# Patient Record
Sex: Male | Born: 1940 | Race: White | Hispanic: No | Marital: Married | Smoking: Former smoker
Health system: Southern US, Community
[De-identification: ages and names within clinical notes are randomized; demographics above are authoritative.]

## PROBLEM LIST (undated history)

## (undated) DIAGNOSIS — I10 Essential (primary) hypertension: Secondary | ICD-10-CM

## (undated) DIAGNOSIS — C801 Malignant (primary) neoplasm, unspecified: Secondary | ICD-10-CM

## (undated) DIAGNOSIS — C4491 Basal cell carcinoma of skin, unspecified: Secondary | ICD-10-CM

## (undated) DIAGNOSIS — Z8619 Personal history of other infectious and parasitic diseases: Secondary | ICD-10-CM

## (undated) DIAGNOSIS — G589 Mononeuropathy, unspecified: Secondary | ICD-10-CM

## (undated) DIAGNOSIS — I639 Cerebral infarction, unspecified: Secondary | ICD-10-CM

## (undated) DIAGNOSIS — Z8673 Personal history of transient ischemic attack (TIA), and cerebral infarction without residual deficits: Secondary | ICD-10-CM

## (undated) DIAGNOSIS — E039 Hypothyroidism, unspecified: Secondary | ICD-10-CM

## (undated) DIAGNOSIS — H8110 Benign paroxysmal vertigo, unspecified ear: Secondary | ICD-10-CM

## (undated) DIAGNOSIS — I4891 Unspecified atrial fibrillation: Secondary | ICD-10-CM

## (undated) DIAGNOSIS — L821 Other seborrheic keratosis: Secondary | ICD-10-CM

## (undated) HISTORY — PX: HERNIA REPAIR: SHX51

## (undated) HISTORY — PX: SKIN SURGERY: SHX2413

## (undated) HISTORY — DX: Other seborrheic keratosis: L82.1

## (undated) HISTORY — PX: OTHER SURGICAL HISTORY: SHX169

## (undated) HISTORY — DX: Unspecified atrial fibrillation: I48.91

## (undated) HISTORY — DX: Basal cell carcinoma of skin, unspecified: C44.91

## (undated) HISTORY — DX: Personal history of other infectious and parasitic diseases: Z86.19

## (undated) HISTORY — DX: Cerebral infarction, unspecified: I63.9

## (undated) HISTORY — DX: Hypothyroidism, unspecified: E03.9

## (undated) HISTORY — DX: Personal history of transient ischemic attack (TIA), and cerebral infarction without residual deficits: Z86.73

## (undated) HISTORY — PX: FINGER AMPUTATION: SHX636

## (undated) HISTORY — DX: Mononeuropathy, unspecified: G58.9

## (undated) HISTORY — DX: Benign paroxysmal vertigo, unspecified ear: H81.10

---

## 1993-11-16 HISTORY — PX: PROSTATECTOMY: SHX69

## 2009-05-09 DIAGNOSIS — K219 Gastro-esophageal reflux disease without esophagitis: Secondary | ICD-10-CM | POA: Insufficient documentation

## 2009-05-09 DIAGNOSIS — L821 Other seborrheic keratosis: Secondary | ICD-10-CM | POA: Insufficient documentation

## 2009-05-09 DIAGNOSIS — J309 Allergic rhinitis, unspecified: Secondary | ICD-10-CM | POA: Insufficient documentation

## 2009-05-09 DIAGNOSIS — G4700 Insomnia, unspecified: Secondary | ICD-10-CM | POA: Insufficient documentation

## 2010-01-15 DIAGNOSIS — H8110 Benign paroxysmal vertigo, unspecified ear: Secondary | ICD-10-CM | POA: Insufficient documentation

## 2010-03-20 ENCOUNTER — Ambulatory Visit: Payer: Self-pay | Admitting: Family Medicine

## 2010-03-20 HISTORY — PX: OTHER SURGICAL HISTORY: SHX169

## 2010-03-21 DIAGNOSIS — Z8673 Personal history of transient ischemic attack (TIA), and cerebral infarction without residual deficits: Secondary | ICD-10-CM | POA: Insufficient documentation

## 2010-04-02 ENCOUNTER — Ambulatory Visit: Payer: Self-pay | Admitting: Cardiovascular Disease

## 2010-04-02 ENCOUNTER — Ambulatory Visit: Payer: Self-pay | Admitting: Family Medicine

## 2010-04-02 HISTORY — PX: OTHER SURGICAL HISTORY: SHX169

## 2010-04-02 HISTORY — PX: DOPPLER ECHOCARDIOGRAPHY: SHX263

## 2010-04-28 DIAGNOSIS — S336XXA Sprain of sacroiliac joint, initial encounter: Secondary | ICD-10-CM | POA: Insufficient documentation

## 2010-05-13 DIAGNOSIS — E039 Hypothyroidism, unspecified: Secondary | ICD-10-CM | POA: Insufficient documentation

## 2010-07-10 ENCOUNTER — Ambulatory Visit: Payer: Self-pay | Admitting: Gastroenterology

## 2010-07-16 LAB — PATHOLOGY REPORT

## 2010-08-14 ENCOUNTER — Ambulatory Visit: Payer: Self-pay | Admitting: Otolaryngology

## 2010-09-08 ENCOUNTER — Ambulatory Visit: Payer: Self-pay | Admitting: Otolaryngology

## 2011-12-14 ENCOUNTER — Encounter: Payer: Self-pay | Admitting: Cardiovascular Disease

## 2011-12-14 ENCOUNTER — Ambulatory Visit (INDEPENDENT_AMBULATORY_CARE_PROVIDER_SITE_OTHER): Payer: Medicare Other | Admitting: Cardiovascular Disease

## 2011-12-14 VITALS — BP 153/79 | HR 90 | Ht 70.0 in | Wt 211.0 lb

## 2011-12-14 DIAGNOSIS — E781 Pure hyperglyceridemia: Secondary | ICD-10-CM

## 2011-12-14 DIAGNOSIS — I10 Essential (primary) hypertension: Secondary | ICD-10-CM

## 2011-12-14 DIAGNOSIS — I4891 Unspecified atrial fibrillation: Secondary | ICD-10-CM

## 2011-12-14 DIAGNOSIS — I499 Cardiac arrhythmia, unspecified: Secondary | ICD-10-CM

## 2011-12-14 MED ORDER — DILTIAZEM HCL 30 MG PO TABS: 30.0000 mg | ORAL_TABLET | Freq: Three times a day (TID) | ORAL | Status: DC | PRN

## 2011-12-14 NOTE — Patient Instructions (Addendum)
Please take the diltiazem one pill as needed for fast heart rate or palpitations Start the xarelto (blood thinner) once a day If there is no side effects we can call in a script. Stop the aspirin  Please call us if you have new issues that need to be addressed before your next appt.  Your physician wants you to follow-up in: 4 week You will receive a reminder letter in the mail two months in advance. If you don't receive a letter, please call our office to schedule the follow-up appointment.

## 2011-12-14 NOTE — Progress Notes (Signed)
Patient ID: Ian Sanders, male    DOB: 05-10-1941, 71 y.o.   MRN: [id number]  HPI Comments: Mr. 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 Sherrie Mustache. He presents for evaluation of his atrial fibrillation. He reports that he does have significant sleep apnea and does not use his CPAP.  He reports that on Wednesday this past week he had tachycardia while laying down at night, otherwise he is not very symptomatic from his atrial fibrillation. He has started to walk with his wife for exercise and has noticed some shortness of breath when he first starts. He takes a small break and then is able to catch up on his breathing and then is able to walk further with no difficulties. He denies any regular symptoms of shortness of breath with exertion or tachycardia or palpitations. He does not know when he converted to atrial fibrillation. He is currently taking a full aspirin.  He reports that he was previously on aSecond blood pressure medication but this caused bradycardia and he stopped this.  Echocardiogram May 2011 showed mildly dilated left atrium, no significant valvular disease, normal ejection fraction. Carotid ultrasound shows small calcified plaque bilaterally with no significant stenosis, May 2011  Recent blood work shows total cholesterol 169, LDL 85, HDL 28, ALT 72, triglycerides 278, normal TSH at 4.28  EKG shows atrial fibrillation with rate 92 beats per minute   Outpatient Encounter Prescriptions as of 12/14/2011  Medication Sig Dispense Refill  . amitriptyline (ELAVIL) 10 MG tablet Take 10 mg by mouth 2 (two) times daily.       Marland Kitchen aspirin 325 MG  tablet Take 325 mg by mouth daily.      Marland Kitchen atenolol-chlorthalidone (TENORETIC) 100-25 MG per tablet Take 1 tablet by mouth daily.      . Magnesium 100 MG CAPS Take 100 mg by mouth. Take four tablets daily.      . Multiple Vitamin (MULTIVITAMIN) tablet Take 1 tablet by mouth daily.      . Omega-3 Fatty Acids (FISH OIL) 1000 MG CAPS Take 1,000 mg by mouth 2 (two) times daily.      Marland Kitchen omeprazole (PRILOSEC) 40 MG capsule Take 40 mg by mouth daily.      . vitamin B-12 (CYANOCOBALAMIN) 100 MCG tablet Take 50 mcg by mouth daily.      Marland Kitchen zolpidem (AMBIEN) 10 MG tablet Take 10 mg by mouth at bedtime as needed.      . diltiazem (CARDIZEM) 30 MG tablet Take 1 tablet (30 mg total) by mouth 3 (three) times daily as needed. For fast heart rate  90 tablet  11     Review of Systems  Constitutional: Negative.   HENT: Negative.   Eyes: Negative.   Respiratory: Negative.   Cardiovascular: Positive for palpitations.  Gastrointestinal: Negative.   Musculoskeletal: Negative.   Skin: Negative.   Neurological: Negative.   Hematological: Negative.   Psychiatric/Behavioral: Negative.   All other systems reviewed and are negative.    BP 153/79  Pulse 90  Ht 5\' 10"  (1.778 m)  Wt 211 lb (95.709 kg)  BMI 30.28 kg/m2  Physical Exam  Nursing note and vitals reviewed.  Constitutional: He is oriented to person, place, and time. He appears well-developed and well-nourished.  HENT:  Head: Normocephalic.  Nose: Nose normal.  Mouth/Throat: Oropharynx is clear and moist.  Eyes: Conjunctivae are normal. Pupils are equal, round, and reactive to light.  Neck: Normal range of motion. Neck supple. No JVD present.  Cardiovascular: Normal rate, S1 normal, S2 normal, normal heart sounds and intact distal pulses.  An irregularly irregular rhythm present. Exam reveals no gallop and no friction rub.   No murmur heard. Pulmonary/Chest: Effort normal and breath sounds normal. No respiratory distress. He has no wheezes. He has  no rales. He exhibits no tenderness.  Abdominal: Soft. Bowel sounds are normal. He exhibits no distension. There is no tenderness.  Musculoskeletal: Normal range of motion. He exhibits no edema and no tenderness.  Lymphadenopathy:    He has no cervical adenopathy.  Neurological: He is alert and oriented to person, place, and time. Coordination normal.  Skin: Skin is warm and dry. No rash noted. No erythema.  Psychiatric: He has a normal mood and affect. His behavior is normal. Judgment and thought content normal.           Assessment and Plan

## 2011-12-14 NOTE — Assessment & Plan Note (Signed)
Blood pressure mildly elevated today. He will monitor his blood pressure at home.

## 2011-12-14 NOTE — Assessment & Plan Note (Signed)
Uncertain how long he has been in atrial fibrillation as he is relatively asymptomatic.  We have discussed with him the risk and benefit of not being on anticoagulation. He would like to start xarelto 20 mg daily. We have given him samples to try for the month. If he has side effects, we could either retry pradaxa 150 mg b.i.d. Or warfarin.  We will meet with him again in 4 weeks' time to determine if he would like further intervention such as a cardioversion. Concerned that his episodes of atrial fibrillation could be secondary to his underlying sleep apnea, possibly hypertension. It is certainly possible that he may have a successful cardioversion only to have recurrence of his atrial fibrillation secondary to OSA.  We have given him a prescription for diltiazem 30 mg to take p.r.n. For tachycardia or palpitations. He will monitor his blood pressure and heart rate at home.

## 2011-12-14 NOTE — Assessment & Plan Note (Signed)
We have encouraged continued exercise, careful diet management in an effort to lose weight. 

## 2012-01-11 ENCOUNTER — Encounter: Payer: Self-pay | Admitting: Cardiovascular Disease

## 2012-01-11 ENCOUNTER — Ambulatory Visit (INDEPENDENT_AMBULATORY_CARE_PROVIDER_SITE_OTHER): Payer: Medicare Other | Admitting: Cardiovascular Disease

## 2012-01-11 DIAGNOSIS — E781 Pure hyperglyceridemia: Secondary | ICD-10-CM

## 2012-01-11 DIAGNOSIS — I4891 Unspecified atrial fibrillation: Secondary | ICD-10-CM

## 2012-01-11 DIAGNOSIS — I10 Essential (primary) hypertension: Secondary | ICD-10-CM

## 2012-01-11 MED ORDER — DIGOXIN 125 MCG PO TABS: 125.0000 ug | ORAL_TABLET | Freq: Every day | ORAL | Status: DC

## 2012-01-11 MED ORDER — RIVAROXABAN 20 MG PO TABS: 1.0000 | ORAL_TABLET | Freq: Every day | ORAL | Status: DC

## 2012-01-11 NOTE — Assessment & Plan Note (Signed)
We have encouraged continued exercise, careful diet management in an effort to lose weight. 

## 2012-01-11 NOTE — Progress Notes (Signed)
Patient ID: Ian Sanders, male    DOB: 24-Dec-1940, 71 y.o.   MRN: [id number]  HPI Comments: Mr. 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 Sherrie Mustache. He presents for evaluation of his atrial fibrillation. He reports that he does have significant sleep apnea and does not use his CPAP.  He was noted to be in atrial fibrillation On his last clinic visit. He has had episodes of tachycardia since we last saw him. Last episode was one week ago. He reports his blood pressure has been well-controlled at home. He does report having significant sleep problems, possible sleep apnea. He is taking xerelto 20 mg daily  Echocardiogram May 2011 showed mildly dilated left atrium, no significant valvular disease, normal ejection fraction. Carotid ultrasound shows small calcified plaque bilaterally with no significant stenosis, May 2011  Recent blood work shows total cholesterol 169, LDL 85, HDL 28, ALT 72, triglycerides 278, normal TSH at 4.28  EKG shows Normal sinus rhythm with rate 64 beats per minute with no significant ST or T wave changes   Outpatient Encounter Prescriptions as of 72/25/2013  Medication Sig Dispense Refill  . amitriptyline (ELAVIL) 10 MG tablet Take 10 mg by mouth 2 (two) times daily.       Marland Kitchen atenolol-chlorthalidone (TENORETIC) 100-25 MG per tablet Take 1 tablet by mouth daily.      Marland Kitchen diltiazem (CARDIZEM) 30 MG tablet Take 1 tablet (30 mg total) by mouth 3 (three) times daily as needed. For fast heart rate  90 tablet  11  . Magnesium 100 MG CAPS Take 100 mg by mouth. Take four tablets daily.      . Multiple Vitamin (MULTIVITAMIN) tablet Take 1  tablet by mouth daily.      . Omega-3 Fatty Acids (FISH OIL) 1000 MG CAPS Take 2,000 mg by mouth 2 (two) times daily.       Marland Kitchen omeprazole (PRILOSEC) 40 MG capsule Take 40 mg by mouth daily.      . Rivaroxaban (XARELTO) 20 MG TABS Take 1 tablet by mouth daily.  90 tablet  3  . vitamin B-12 (CYANOCOBALAMIN) 100 MCG tablet Take 100 mcg by mouth daily.       Marland Kitchen zolpidem (AMBIEN) 10 MG tablet Take 10 mg by mouth at bedtime as needed.         Review of Systems  Constitutional: Negative.   HENT: Negative.   Eyes: Negative.   Respiratory: Negative.   Cardiovascular: Positive for palpitations.  Gastrointestinal: Negative.   Musculoskeletal: Negative.   Skin: Negative.   Neurological: Negative.   Hematological: Negative.   Psychiatric/Behavioral: Negative.   All other systems reviewed and are negative.    BP 128/72  Pulse 64  Ht 5\' 10"  (1.778 m)  Wt 215 lb 6.4 oz (97.705 kg)  BMI 30.91 kg/m2  Physical Exam  Nursing note and vitals reviewed. Constitutional: He is oriented to person, place, and time. He appears well-developed and well-nourished.  HENT:  Head: Normocephalic.  Nose: Nose normal.  Mouth/Throat: Oropharynx is clear and moist.  Eyes: Conjunctivae are normal. Pupils are equal, round, and reactive to light.  Neck: Normal range of motion. Neck supple. No  JVD present.  Cardiovascular: Normal rate, S1 normal, S2 normal, normal heart sounds and intact distal pulses.  An irregularly irregular rhythm present. Exam reveals no gallop and no friction rub.   No murmur heard. Pulmonary/Chest: Effort normal and breath sounds normal. No respiratory distress. He has no wheezes. He has no rales. He exhibits no tenderness.  Abdominal: Soft. Bowel sounds are normal. He exhibits no distension. There is no tenderness.  Musculoskeletal: Normal range of motion. He exhibits no edema and no tenderness.  Lymphadenopathy:    He has no cervical adenopathy.  Neurological: He is alert and oriented to  person, place, and time. Coordination normal.  Skin: Skin is warm and dry. No rash noted. No erythema.  Psychiatric: He has a normal mood and affect. His behavior is normal. Judgment and thought content normal.           Assessment and Plan

## 2012-01-11 NOTE — Assessment & Plan Note (Signed)
Blood pressure is well controlled on today's visit. No changes made to the medications. 

## 2012-01-11 NOTE — Patient Instructions (Addendum)
You are doing well. Please start digoxin one a day for heart rhythm  Continue on your other medication  Please call us if you have new issues that need to be addressed before your next appt.  Your physician wants you to follow-up in: 3 months.  You will receive a reminder letter in the mail two months in advance. If you don't receive a letter, please call our office to schedule the follow-up appointment.

## 2012-01-11 NOTE — Assessment & Plan Note (Addendum)
He is in normal sinus rhythm on today's visit. 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 Possible underlying sleep apnea.  We will start digoxin 0.125 mg daily and continue xerelto. Heart rate is in the low 60s. If he continues to have tachycardia, we could add an alternate medication such as flecainide.

## 2012-02-09 ENCOUNTER — Encounter: Payer: Self-pay | Admitting: Cardiovascular Disease

## 2012-02-18 ENCOUNTER — Encounter: Payer: Self-pay | Admitting: Cardiovascular Disease

## 2012-04-18 ENCOUNTER — Encounter: Payer: Self-pay | Admitting: Cardiovascular Disease

## 2012-04-18 ENCOUNTER — Ambulatory Visit (INDEPENDENT_AMBULATORY_CARE_PROVIDER_SITE_OTHER): Payer: Medicare Other | Admitting: Cardiovascular Disease

## 2012-04-18 VITALS — BP 124/72 | HR 68 | Ht 70.0 in | Wt 207.8 lb

## 2012-04-18 DIAGNOSIS — I499 Cardiac arrhythmia, unspecified: Secondary | ICD-10-CM

## 2012-04-18 DIAGNOSIS — G4730 Sleep apnea, unspecified: Secondary | ICD-10-CM | POA: Insufficient documentation

## 2012-04-18 DIAGNOSIS — I4891 Unspecified atrial fibrillation: Secondary | ICD-10-CM

## 2012-04-18 DIAGNOSIS — I10 Essential (primary) hypertension: Secondary | ICD-10-CM

## 2012-04-18 DIAGNOSIS — G478 Other sleep disorders: Secondary | ICD-10-CM

## 2012-04-18 DIAGNOSIS — R079 Chest pain, unspecified: Secondary | ICD-10-CM

## 2012-04-18 MED ORDER — DILTIAZEM HCL 30 MG PO TABS: 30.0000 mg | ORAL_TABLET | Freq: Three times a day (TID) | ORAL | Status: DC | PRN

## 2012-04-18 NOTE — Progress Notes (Signed)
Patient ID: Ian Sanders, male    DOB: 05-24-1941, 71 y.o.   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 Sherrie Mustache. He presents for evaluation of his atrial fibrillation. He reports that he does have significant sleep apnea and does not use his CPAP.  He was noted to be in atrial fibrillation two office visits ago, sinus rhythm on his last office visit. He has had one episode of fluttering in his chest described as a "butterfly", none recently.  He does report having significant sleep problems, possible sleep apnea. He is taking xarelto 20 mg daily for atrial fibrillation  Echocardiogram May 2011 showed mildly dilated left atrium, no significant valvular disease, normal ejection fraction. Carotid ultrasound shows small calcified plaque bilaterally with no significant stenosis, May 2011  Recent blood work shows total cholesterol 169, LDL 85, HDL 28, ALT 72, triglycerides 278, normal TSH at 4.28  EKG shows Normal sinus rhythm with rate 68 beats per minute with no significant ST or T wave changes   Outpatient Encounter Prescriptions as of 04/18/2012  Medication Sig Dispense Refill  . amitriptyline (ELAVIL) 10 MG tablet Take 10 mg by mouth 2 (two) times daily.       Marland Kitchen atenolol-chlorthalidone (TENORETIC) 100-25 MG per tablet Take 1 tablet by mouth daily.      . digoxin (LANOXIN) 0.125 MG tablet Take 1 tablet (125 mcg total) by mouth daily.  90 tablet  3  . diltiazem (CARDIZEM) 30 MG tablet Take 1 tablet (30 mg total) by mouth 3 (three) times daily as needed. For fast heart rate  270 tablet  3  . Magnesium 100 MG CAPS Take  100 mg by mouth daily.       . Multiple Vitamin (MULTIVITAMIN) tablet Take 1 tablet by mouth daily.      . Omega-3 Fatty Acids (FISH OIL) 1000 MG CAPS Take 2,000 mg by mouth 2 (two) times daily.       Marland Kitchen omeprazole (PRILOSEC) 40 MG capsule Take 40 mg by mouth daily.      . Rivaroxaban (XARELTO) 20 MG TABS Take 1 tablet by mouth daily.  90 tablet  3  . vitamin B-12 (CYANOCOBALAMIN) 100 MCG tablet Take 100 mcg by mouth daily.       Marland Kitchen zolpidem (AMBIEN) 10 MG tablet Take 10 mg by mouth at bedtime as needed.        Review of Systems  Constitutional: Negative.   HENT: Negative.   Eyes: Negative.   Respiratory: Negative.   Cardiovascular: Positive for palpitations.  Gastrointestinal: Negative.   Musculoskeletal: Negative.   Skin: Negative.   Neurological: Negative.   Hematological: Negative.   Psychiatric/Behavioral: Negative.   All other systems reviewed and are negative.    BP 124/72  Pulse 68  Ht 5\' 10"  (1.778 m)  Wt 207 lb 12 oz (94.235 kg)  BMI 29.81 kg/m2  Physical Exam  Nursing note and vitals reviewed. Constitutional: He is oriented to person, place, and time. He appears well-developed and well-nourished.  HENT:  Head: Normocephalic.  Nose: Nose normal.  Mouth/Throat: Oropharynx is clear and moist.  Eyes: Conjunctivae are normal.  Pupils are equal, round, and reactive to light.  Neck: Normal range of motion. Neck supple. No JVD present.  Cardiovascular: Normal rate, S1 normal, S2 normal, normal heart sounds and intact distal pulses.  An irregularly irregular rhythm present. Exam reveals no gallop and no friction rub.   No murmur heard. Pulmonary/Chest: Effort normal and breath sounds normal. No respiratory distress. He has no wheezes. He has no rales. He exhibits no tenderness.  Abdominal: Soft. Bowel sounds are normal. He exhibits no distension. There is no tenderness.  Musculoskeletal: Normal range of motion. He exhibits no edema and no tenderness.  Lymphadenopathy:     He has no cervical adenopathy.  Neurological: He is alert and oriented to person, place, and time. Coordination normal.  Skin: Skin is warm and dry. No rash noted. No erythema.  Psychiatric: He has a normal mood and affect. His behavior is normal. Judgment and thought content normal.           Assessment and Plan

## 2012-04-18 NOTE — Assessment & Plan Note (Signed)
He is not interested in sharing any other studies concerning sleep studies or wearing CPAP.

## 2012-04-18 NOTE — Patient Instructions (Signed)
You are doing well. No medication changes were made.  Please call us if you have new issues that need to be addressed before your next appt.  Your physician wants you to follow-up in: 6 months.  You will receive a reminder letter in the mail two months in advance. If you don't receive a letter, please call our office to schedule the follow-up appointment.   

## 2012-04-18 NOTE — Assessment & Plan Note (Addendum)
Rare episodes of palpitations. He will continue on anticoagulation for now. He does report having more symptoms at nighttime when he is sleeping, possibly from sleep apnea. Currently he takes diltiazem 30 mg at nighttime. We have suggested that he take extra diltiazem during the daytime as well for any significant palpitations that do not resolve concerning for atrial fibrillation.

## 2012-04-18 NOTE — Assessment & Plan Note (Signed)
Blood pressure is well controlled on today's visit. No changes made to the medications. 

## 2012-08-08 ENCOUNTER — Telehealth: Payer: Self-pay

## 2012-08-08 NOTE — Telephone Encounter (Signed)
Pt needs letter faxed to Dr. Shawna Clamp for hernia repair/clearance Per Dr. Mariah Milling, pt is at acceptable risk for surgery from cardiac standpoint.  I will fax this to [phone]

## 2012-08-08 NOTE — Telephone Encounter (Signed)
Letter faxed.

## 2012-08-10 ENCOUNTER — Ambulatory Visit: Payer: Self-pay | Admitting: Surgery

## 2012-08-10 LAB — CBC WITH DIFFERENTIAL/PLATELET
Basophil #: 0 10*3/uL (ref 0.0–0.1)
Eosinophil #: 0.2 10*3/uL (ref 0.0–0.7)
HGB: 14.5 g/dL (ref 13.0–18.0)
Lymphocyte #: 1.1 10*3/uL (ref 1.0–3.6)
MCH: 28.6 pg (ref 26.0–34.0)
MCHC: 34.4 g/dL (ref 32.0–36.0)
MCV: 83 fL (ref 80–100)
Monocyte #: 0.5 x10 3/mm (ref 0.2–1.0)
Monocyte %: 7.9 %
Neutrophil #: 4.3 10*3/uL (ref 1.4–6.5)
RBC: 5.08 10*6/uL (ref 4.40–5.90)
RDW: 13.9 % (ref 11.5–14.5)

## 2012-08-10 LAB — URINALYSIS, COMPLETE
Bilirubin,UR: NEGATIVE
Blood: NEGATIVE
Leukocyte Esterase: NEGATIVE
Ph: 7 (ref 4.5–8.0)
Protein: NEGATIVE
RBC,UR: <1 /HPF (ref 0–5)
Squamous Epithelial: 1
WBC UR: NONE SEEN /HPF (ref 0–5)

## 2012-08-10 LAB — BASIC METABOLIC PANEL
Anion Gap: 11 (ref 7–16)
BUN: 14 mg/dL (ref 7–18)
Chloride: 99 mmol/L (ref 98–107)
Co2: 29 mmol/L (ref 21–32)
Creatinine: 1.18 mg/dL (ref 0.60–1.30)
EGFR (Non-African Amer.): >60
Glucose: 123 mg/dL — ABNORMAL HIGH (ref 65–99)
Potassium: 3.4 mmol/L — ABNORMAL LOW (ref 3.5–5.1)

## 2012-08-17 ENCOUNTER — Ambulatory Visit: Payer: Self-pay | Admitting: Surgery

## 2012-08-17 LAB — CK TOTAL AND CKMB (NOT AT ARMC)
CK, Total: 68 U/L (ref 35–232)
CK-MB: <0.5 ng/mL — ABNORMAL LOW (ref 0.5–3.6)

## 2012-08-17 LAB — COMPREHENSIVE METABOLIC PANEL
Alkaline Phosphatase: 71 U/L (ref 50–136)
Anion Gap: 9 (ref 7–16)
BUN: 15 mg/dL (ref 7–18)
Bilirubin,Total: 0.9 mg/dL (ref 0.2–1.0)
Calcium, Total: 8.4 mg/dL — ABNORMAL LOW (ref 8.5–10.1)
Co2: 30 mmol/L (ref 21–32)
Creatinine: 1.23 mg/dL (ref 0.60–1.30)
EGFR (African American): >60
Glucose: 117 mg/dL — ABNORMAL HIGH (ref 65–99)
Potassium: 3.4 mmol/L — ABNORMAL LOW (ref 3.5–5.1)
SGOT(AST): 42 U/L — ABNORMAL HIGH (ref 15–37)
SGPT (ALT): 41 U/L (ref 12–78)
Sodium: 141 mmol/L (ref 136–145)
Total Protein: 6.9 g/dL (ref 6.4–8.2)

## 2012-08-17 LAB — CBC WITH DIFFERENTIAL/PLATELET
Basophil %: 0.4 %
Eosinophil %: 1.6 %
HGB: 14.3 g/dL (ref 13.0–18.0)
MCH: 28.9 pg (ref 26.0–34.0)
Monocyte #: 0.4 x10 3/mm (ref 0.2–1.0)
Monocyte %: 6.5 %
Neutrophil #: 4.6 10*3/uL (ref 1.4–6.5)
Neutrophil %: 76 %
RBC: 4.93 10*6/uL (ref 4.40–5.90)
WBC: 6.1 10*3/uL (ref 3.8–10.6)

## 2012-08-17 LAB — TROPONIN I: Troponin-I: <0.02 ng/mL

## 2012-08-18 LAB — CBC WITH DIFFERENTIAL/PLATELET
Basophil #: 0 10*3/uL (ref 0.0–0.1)
Basophil %: 0.4 %
HCT: 40.1 % (ref 40.0–52.0)
HGB: 13.9 g/dL (ref 13.0–18.0)
Lymphocyte #: 1.1 10*3/uL (ref 1.0–3.6)
Lymphocyte %: 11.8 %
MCV: 83 fL (ref 80–100)
Monocyte %: 7.6 %
Neutrophil #: 7.3 10*3/uL — ABNORMAL HIGH (ref 1.4–6.5)
Platelet: 105 10*3/uL — ABNORMAL LOW (ref 150–440)
WBC: 9.2 10*3/uL (ref 3.8–10.6)

## 2012-08-18 LAB — COMPREHENSIVE METABOLIC PANEL
Albumin: 3.4 g/dL (ref 3.4–5.0)
BUN: 16 mg/dL (ref 7–18)
Bilirubin,Total: 1.4 mg/dL — ABNORMAL HIGH (ref 0.2–1.0)
Calcium, Total: 8.2 mg/dL — ABNORMAL LOW (ref 8.5–10.1)
Chloride: 102 mmol/L (ref 98–107)
Co2: 30 mmol/L (ref 21–32)
Creatinine: 1.36 mg/dL — ABNORMAL HIGH (ref 0.60–1.30)
EGFR (African American): >60
EGFR (Non-African Amer.): 52 — ABNORMAL LOW
Osmolality: 284 (ref 275–301)
SGOT(AST): 37 U/L (ref 15–37)
SGPT (ALT): 37 U/L (ref 12–78)
Sodium: 142 mmol/L (ref 136–145)
Total Protein: 6.6 g/dL (ref 6.4–8.2)

## 2012-08-18 LAB — CK TOTAL AND CKMB (NOT AT ARMC)
CK, Total: 58 U/L (ref 35–232)
CK, Total: 53 U/L (ref 35–232)
CK-MB: <0.5 ng/mL — ABNORMAL LOW (ref 0.5–3.6)

## 2012-10-19 ENCOUNTER — Encounter: Payer: Self-pay | Admitting: Cardiovascular Disease

## 2012-10-19 ENCOUNTER — Ambulatory Visit (INDEPENDENT_AMBULATORY_CARE_PROVIDER_SITE_OTHER): Payer: Medicare Other | Admitting: Cardiovascular Disease

## 2012-10-19 VITALS — BP 110/72 | HR 52 | Ht 70.0 in | Wt 195.2 lb

## 2012-10-19 DIAGNOSIS — R0602 Shortness of breath: Secondary | ICD-10-CM

## 2012-10-19 DIAGNOSIS — I4891 Unspecified atrial fibrillation: Secondary | ICD-10-CM

## 2012-10-19 DIAGNOSIS — G4730 Sleep apnea, unspecified: Secondary | ICD-10-CM

## 2012-10-19 DIAGNOSIS — G478 Other sleep disorders: Secondary | ICD-10-CM

## 2012-10-19 DIAGNOSIS — I10 Essential (primary) hypertension: Secondary | ICD-10-CM

## 2012-10-19 MED ORDER — FLECAINIDE ACETATE 50 MG PO TABS: 50.0000 mg | ORAL_TABLET | Freq: Two times a day (BID) | ORAL | Status: DC

## 2012-10-19 NOTE — Assessment & Plan Note (Signed)
Currently in atrial fibrillation. He is on anticoagulation. We will start that tonight 50 mg twice a day, decrease his atenolol to 50 mg daily as he has bradycardia today.

## 2012-10-19 NOTE — Patient Instructions (Addendum)
You are doing well. Please cut the atenolol in 1/2 daily Start flecainide 50 mg twice a day Monitor your heart rate, call if rate is still too slow (low 50s)  Please call us if you have new issues that need to be addressed before your next appt.  Your physician wants you to follow-up in: 2 months.

## 2012-10-19 NOTE — Assessment & Plan Note (Signed)
He has not been interested in obstructive sleep apnea workup in the past.

## 2012-10-19 NOTE — Assessment & Plan Note (Signed)
Atenolol chlorthalidone dose will be cut in half. Continue other medications

## 2012-10-19 NOTE — Progress Notes (Signed)
Patient ID: Ian Sanders, male    DOB: 17-May-1941, 71 y.o.   MRN: [id number]  HPI Comments: Mr. 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 Sherrie Mustache. He presents for evaluation of his atrial fibrillation. He reports that he does have significant sleep apnea and does not use his CPAP.  He was noted to be in atrial fibrillation several office visits ago, sinus rhythm to  office visits ago.   He does report having significant sleep problems, possible sleep apnea. Today he reports that he feels well. He is taking xarelto 20 mg daily for atrial fibrillation  Echocardiogram May 2011 showed mildly dilated left atrium, no significant valvular disease, normal ejection fraction. Carotid ultrasound shows small calcified plaque bilaterally with no significant stenosis, May 2011  Recent blood work shows total cholesterol 169, LDL 85, HDL 28, ALT 72, triglycerides 278, normal TSH at 4.28  EKG shows atrial fibrillation with ventricular rate 52 beats per minute He took diltiazem early this morning and this afternoon prior to this appointment   Outpatient Encounter Prescriptions as of 10/19/2012  Medication Sig Dispense Refill  . atenolol-chlorthalidone (TENORETIC) 100-25 MG per tablet Take 1 tablet by mouth daily.      . digoxin (LANOXIN) 0.125 MG tablet Take 1 tablet (125 mcg total) by mouth daily.  90 tablet  3  . diltiazem (CARDIZEM) 30 MG tablet Take 1 tablet (30 mg total) by mouth 3 (three) times daily as needed. For fast heart rate  270 tablet  3  . meclizine (ANTIVERT) 25 MG tablet Take 25 mg by mouth as needed.      . Multiple Vitamin (MULTIVITAMIN) tablet  Take 1 tablet by mouth daily.      . Omega-3 Fatty Acids (FISH OIL) 1000 MG CAPS Take 1,000 mg by mouth daily.       Marland Kitchen omeprazole (PRILOSEC) 40 MG capsule Take 40 mg by mouth daily.      . Rivaroxaban (XARELTO) 20 MG TABS Take 1 tablet by mouth daily.  90 tablet  3  . vitamin B-12 (CYANOCOBALAMIN) 100 MCG tablet Take 100 mcg by mouth daily.       Marland Kitchen zolpidem (AMBIEN) 10 MG tablet Take 10 mg by mouth at bedtime as needed.      . flecainide (TAMBOCOR) 50 MG tablet Take 1 tablet (50 mg total) by mouth 2 (two) times daily.  60 tablet  6    Review of Systems  Constitutional: Negative.   HENT: Negative.   Eyes: Negative.   Respiratory: Negative.   Cardiovascular: Positive for palpitations.  Gastrointestinal: Negative.   Musculoskeletal: Negative.   Skin: Negative.   Neurological: Negative.   Hematological: Negative.   Psychiatric/Behavioral: Negative.   All other systems reviewed and are negative.   BP 110/72  Pulse 52  Ht 5\' 10"  (1.778 m)  Wt 195 lb 4 oz (88.565 kg)  BMI 28.02 kg/m2  Physical Exam  Nursing note and vitals reviewed. Constitutional: He is oriented to person, place, and time. He appears well-developed and well-nourished.  HENT:  Head: Normocephalic.  Nose: Nose normal.  Mouth/Throat: Oropharynx is clear and moist.  Eyes: Conjunctivae normal are normal. Pupils are  equal, round, and reactive to light.  Neck: Normal range of motion. Neck supple. No JVD present.  Cardiovascular: Normal rate, S1 normal, S2 normal, normal heart sounds and intact distal pulses.  An irregularly irregular rhythm present. Exam reveals no gallop and no friction rub.   No murmur heard. Pulmonary/Chest: Effort normal and breath sounds normal. No respiratory distress. He has no wheezes. He has no rales. He exhibits no tenderness.  Abdominal: Soft. Bowel sounds are normal. He exhibits no distension. There is no tenderness.  Musculoskeletal: Normal range of motion. He exhibits no edema and no  tenderness.  Lymphadenopathy:    He has no cervical adenopathy.  Neurological: He is alert and oriented to person, place, and time. Coordination normal.  Skin: Skin is warm and dry. No rash noted. No erythema.  Psychiatric: He has a normal mood and affect. His behavior is normal. Judgment and thought content normal.           Assessment and Plan

## 2012-10-20 ENCOUNTER — Telehealth: Payer: Self-pay | Admitting: Cardiovascular Disease

## 2012-10-20 MED ORDER — FLECAINIDE ACETATE 50 MG PO TABS: 50.0000 mg | ORAL_TABLET | Freq: Two times a day (BID) | ORAL | Status: DC

## 2012-10-20 NOTE — Telephone Encounter (Signed)
Refilled Flecainide. 

## 2012-10-20 NOTE — Telephone Encounter (Signed)
flecanide 50mg  pt can get cheaper from [facility]. Please send script for 90 day with refills

## 2012-12-16 ENCOUNTER — Telehealth: Payer: Self-pay | Admitting: *Deleted

## 2012-12-16 NOTE — Telephone Encounter (Signed)
Pt needs to have dental work. Wants to know if he needs to hold his xarelto

## 2012-12-16 NOTE — Telephone Encounter (Signed)
Pts wife informed to ask dentist first, that this decision is up to him Understanding verb

## 2012-12-21 ENCOUNTER — Encounter: Payer: Self-pay | Admitting: Cardiovascular Disease

## 2012-12-21 ENCOUNTER — Ambulatory Visit (INDEPENDENT_AMBULATORY_CARE_PROVIDER_SITE_OTHER): Payer: Medicare Other | Admitting: Cardiovascular Disease

## 2012-12-21 VITALS — BP 120/60 | HR 49 | Ht 70.0 in | Wt 197.2 lb

## 2012-12-21 DIAGNOSIS — I499 Cardiac arrhythmia, unspecified: Secondary | ICD-10-CM

## 2012-12-21 DIAGNOSIS — I10 Essential (primary) hypertension: Secondary | ICD-10-CM

## 2012-12-21 DIAGNOSIS — R42 Dizziness and giddiness: Secondary | ICD-10-CM

## 2012-12-21 DIAGNOSIS — Z01810 Encounter for preprocedural cardiovascular examination: Secondary | ICD-10-CM

## 2012-12-21 DIAGNOSIS — I4891 Unspecified atrial fibrillation: Secondary | ICD-10-CM

## 2012-12-21 NOTE — Assessment & Plan Note (Signed)
Blood pressure is well controlled on today's visit. No changes made to the medications. 

## 2012-12-21 NOTE — Assessment & Plan Note (Signed)
He is scheduled to have several teeth pulled this coming Monday. We have suggested he hold anticoagulation 2 days prior to the procedure and restart one day after the procedure.

## 2012-12-21 NOTE — Progress Notes (Signed)
Patient ID: Ian Sanders, male    DOB: [birth year], 72 y.o.   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 Sherrie Mustache. He presents for evaluation of his atrial fibrillation. He reports that he does have significant sleep apnea and does not use his CPAP.  He was noted to be in atrial fibrillation several office visits ago, converting to normal sinus rhythm.   He does report having significant sleep problems, possible sleep apnea. Today he reports that he feels well. He is taking xarelto 20 mg daily for atrial fibrillation He reports he is scheduled for dental work this coming Monday and needs to hold his anticoagulation. Overall he feels well with no complaints. No significant shortness of breath or chest pain  Echocardiogram May 2011 showed mildly dilated left atrium, no significant valvular disease, normal ejection fraction. Carotid ultrasound shows small calcified plaque bilaterally with no significant stenosis, May 2011  Recent blood work shows total cholesterol 169, LDL 85, HDL 28, ALT 72, triglycerides 278, normal TSH at 4.28  EKG shows atrial fibrillation with ventricular rate 49 beats per minute He took diltiazem early this morning    Outpatient Encounter Prescriptions as of 10/19/2012  Medication Sig Dispense Refill  . atenolol-chlorthalidone (TENORETIC) 100-25 MG per tablet Take 1 tablet by mouth daily.      . digoxin (LANOXIN) 0.125 MG tablet Take 1 tablet (125 mcg total) by mouth daily.  90 tablet  3  . diltiazem (CARDIZEM) 30 MG tablet Take 1 tablet (30 mg total) by mouth 3 (three) times daily as needed. For  fast heart rate  270 tablet  3  . meclizine (ANTIVERT) 25 MG tablet Take 25 mg by mouth as needed.      . Multiple Vitamin (MULTIVITAMIN) tablet Take 1 tablet by mouth daily.      . Omega-3 Fatty Acids (FISH OIL) 1000 MG CAPS Take 1,000 mg by mouth daily.       Marland Kitchen omeprazole (PRILOSEC) 40 MG capsule Take 40 mg by mouth daily.      . Rivaroxaban (XARELTO) 20 MG TABS Take 1 tablet by mouth daily.  90 tablet  3  . vitamin B-12 (CYANOCOBALAMIN) 100 MCG tablet Take 100 mcg by mouth daily.       Marland Kitchen zolpidem (AMBIEN) 10 MG tablet Take 10 mg by mouth at bedtime as needed.      . flecainide (TAMBOCOR) 50 MG tablet Take 1 tablet (50 mg total) by mouth 2 (two) times daily.  60 tablet  6    Review of Systems  Constitutional: Negative.   HENT: Negative.   Eyes: Negative.   Respiratory: Negative.   Cardiovascular: Positive for palpitations.  Gastrointestinal: Negative.   Musculoskeletal: Negative.   Skin: Negative.   Neurological: Negative.   Hematological: Negative.   Psychiatric/Behavioral: Negative.   All other systems reviewed and are negative.   BP 120/60  Pulse 49  Ht 5\' 10"  (1.778 m)  Wt 197 lb 4 oz (89.472 kg)  BMI 28.30 kg/m2  Physical Exam  Nursing note and vitals reviewed. Constitutional: He is oriented to person, place, and time. He appears well-developed and well-nourished.  HENT:  Head: Normocephalic.  Nose: Nose normal.  Mouth/Throat: Oropharynx is clear and moist.  Eyes: Conjunctivae normal are normal. Pupils are equal, round, and reactive to light.  Neck: Normal range of motion. Neck supple. No JVD present.  Cardiovascular: Normal rate, S1 normal, S2 normal, normal heart sounds and intact distal pulses.  An irregularly irregular rhythm present. Exam reveals no gallop and no friction rub.   No murmur heard. Pulmonary/Chest: Effort normal and breath sounds normal. No respiratory distress. He has no wheezes. He has no rales. He exhibits no tenderness.  Abdominal: Soft. Bowel  sounds are normal. He exhibits no distension. There is no tenderness.  Musculoskeletal: Normal range of motion. He exhibits no edema and no tenderness.  Lymphadenopathy:    He has no cervical adenopathy.  Neurological: He is alert and oriented to person, place, and time. Coordination normal.  Skin: Skin is warm and dry. No rash noted. No erythema.  Psychiatric: He has a normal mood and affect. His behavior is normal. Judgment and thought content normal.           Assessment and Plan

## 2012-12-21 NOTE — Assessment & Plan Note (Signed)
Currently in normal sinus rhythm. Likely paroxysmal. He we'll closely monitor his heart rate at home and come to the office when he feels he is in normal sinus rhythm for EKG. We'll keep him on current medication regimen for now.

## 2012-12-21 NOTE — Patient Instructions (Addendum)
Stop xarelto on Saturday (last dose Friday night) for dental procedure Restart no earlier than Tuesday, possibly Wednesday  Hold the atenolol the morning of the dental procedure  Please call us if you have new issues that need to be addressed before your next appt.  Your physician wants you to follow-up in: 6 months.  You will receive a reminder letter in the mail two months in advance. If you don't receive a letter, please call our office to schedule the follow-up appointment.

## 2013-02-14 ENCOUNTER — Other Ambulatory Visit: Payer: Self-pay | Admitting: *Deleted

## 2013-02-14 ENCOUNTER — Telehealth: Payer: Self-pay

## 2013-02-14 DIAGNOSIS — I4891 Unspecified atrial fibrillation: Secondary | ICD-10-CM

## 2013-02-14 MED ORDER — DIGOXIN 125 MCG PO TABS: 125.0000 ug | ORAL_TABLET | Freq: Every day | ORAL | Status: DC

## 2013-02-14 NOTE — Telephone Encounter (Signed)
Refilled Digoxin sent to [facility].

## 2013-02-14 NOTE — Telephone Encounter (Signed)
Pt would like Digoxin sent to [facility]. Pt states he needs some now, and if would like samples, if not, sent to [facility]

## 2013-02-14 NOTE — Telephone Encounter (Signed)
Sent 30 day supply refill for Digoxin to [facility] in [place] as well until pt is able to receive 90 day supply from [facility].

## 2013-02-20 ENCOUNTER — Encounter: Payer: Self-pay | Admitting: General Surgery

## 2013-02-20 ENCOUNTER — Ambulatory Visit (INDEPENDENT_AMBULATORY_CARE_PROVIDER_SITE_OTHER): Payer: Medicare Other | Admitting: General Surgery

## 2013-02-20 VITALS — BP 140/78 | HR 60 | Resp 12 | Ht 70.0 in | Wt 196.0 lb

## 2013-02-20 DIAGNOSIS — R1031 Right lower quadrant pain: Secondary | ICD-10-CM

## 2013-02-20 NOTE — Patient Instructions (Addendum)
Patient advised to use heat in the area of soreness. Use caution to not burn yourself.
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
## 2013-02-21 ENCOUNTER — Encounter: Payer: Self-pay | Admitting: General Surgery

## 2013-02-21 DIAGNOSIS — R1030 Lower abdominal pain, unspecified: Secondary | ICD-10-CM | POA: Insufficient documentation

## 2013-02-22 ENCOUNTER — Other Ambulatory Visit: Payer: Self-pay

## 2013-02-22 DIAGNOSIS — I4891 Unspecified atrial fibrillation: Secondary | ICD-10-CM

## 2013-02-22 MED ORDER — RIVAROXABAN 20 MG PO TABS: 20.0000 mg | ORAL_TABLET | Freq: Every day | ORAL | Status: DC

## 2013-02-22 NOTE — Telephone Encounter (Signed)
Refilled [facility]arelto [facility]

## 2013-12-19 ENCOUNTER — Ambulatory Visit: Payer: Medicare Other | Admitting: Cardiovascular Disease

## 2013-12-19 DIAGNOSIS — R7303 Prediabetes: Secondary | ICD-10-CM | POA: Insufficient documentation

## 2013-12-19 DIAGNOSIS — R972 Elevated prostate specific antigen [PSA]: Secondary | ICD-10-CM | POA: Insufficient documentation

## 2013-12-19 DIAGNOSIS — E1149 Type 2 diabetes mellitus with other diabetic neurological complication: Secondary | ICD-10-CM | POA: Insufficient documentation

## 2013-12-21 ENCOUNTER — Ambulatory Visit (INDEPENDENT_AMBULATORY_CARE_PROVIDER_SITE_OTHER): Payer: Commercial Managed Care - HMO | Admitting: Cardiovascular Disease

## 2013-12-21 ENCOUNTER — Encounter: Payer: Self-pay | Admitting: Cardiovascular Disease

## 2013-12-21 VITALS — BP 142/80 | HR 65 | Ht 70.0 in | Wt 195.5 lb

## 2013-12-21 DIAGNOSIS — I499 Cardiac arrhythmia, unspecified: Secondary | ICD-10-CM

## 2013-12-21 DIAGNOSIS — I10 Essential (primary) hypertension: Secondary | ICD-10-CM

## 2013-12-21 DIAGNOSIS — G4730 Sleep apnea, unspecified: Secondary | ICD-10-CM

## 2013-12-21 DIAGNOSIS — I4891 Unspecified atrial fibrillation: Secondary | ICD-10-CM

## 2013-12-21 DIAGNOSIS — R079 Chest pain, unspecified: Secondary | ICD-10-CM

## 2013-12-21 DIAGNOSIS — E785 Hyperlipidemia, unspecified: Secondary | ICD-10-CM

## 2013-12-21 DIAGNOSIS — G478 Other sleep disorders: Secondary | ICD-10-CM

## 2013-12-21 MED ORDER — DILTIAZEM HCL 30 MG PO TABS: 30.0000 mg | ORAL_TABLET | Freq: Three times a day (TID) | ORAL | Status: DC | PRN

## 2013-12-21 MED ORDER — RIVAROXABAN 20 MG PO TABS: 20.0000 mg | ORAL_TABLET | Freq: Every day | ORAL | Status: DC

## 2013-12-21 MED ORDER — ATENOLOL-CHLORTHALIDONE 50-25 MG PO TABS: 1.0000 | ORAL_TABLET | Freq: Every day | ORAL | Status: DC

## 2013-12-21 MED ORDER — DIGOXIN 125 MCG PO TABS: 125.0000 ug | ORAL_TABLET | Freq: Every day | ORAL | Status: DC

## 2013-12-21 MED ORDER — FLECAINIDE ACETATE 100 MG PO TABS: 100.0000 mg | ORAL_TABLET | Freq: Two times a day (BID) | ORAL | Status: DC

## 2013-12-21 NOTE — Progress Notes (Signed)
Patient ID: Ian Sanders, male    DOB: 19-Aug-1941, 73 y.o.   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 Caryn Section. He presents for evaluation of his atrial fibrillation. History of obstructive sleep apnea, does not use CPAP  In followup today, he reports that he continues to had paroxysmal atrial fibrillation. He is unsure how frequent this is happening but reports that sometimes it can last all day, sometimes more than 24 hours. He has been taking flecainide 50 mg twice a day, atenolol, digoxin. She's not been taking any short-acting diltiazem Otherwise she feels well with no complaints.  He does report having significant sleep problems, sleep apnea.  He is taking xarelto 20 mg daily   No significant shortness of breath or chest pain  Echocardiogram May 2011 showed mildly dilated left atrium, no significant valvular disease, normal ejection fraction. Carotid ultrasound shows small calcified plaque bilaterally with no significant stenosis, May 2011  Recent blood work shows total cholesterol 157, LDL 52, HDL 26, normal LFTs  EKG shows normal sinus rhythm with rate 65 beats per minute, no significant ST or T wave changes   Outpatient Encounter Prescriptions as of 12/21/2013  Medication Sig  . atenolol-chlorthalidone (TENORETIC) 50-25 MG per tablet Take 1 tablet by mouth daily.  . cyclobenzaprine (FLEXERIL) 5 MG tablet Take 5 mg by mouth as needed for muscle spasms.  . digoxin (LANOXIN) 0.125 MG tablet Take 1 tablet (125 mcg total) by mouth daily.  Marland Kitchen diltiazem (CARDIZEM) 30 MG tablet Take 1 tablet (30 mg  total) by mouth 3 (three) times daily as needed. For fast heart rate  . flecainide (TAMBOCOR) 50 MG tablet Take 50 mg by mouth 2 (two) times daily.   . Magnesium 400 MG CAPS Take 400 mg by mouth as needed.  . meclizine (ANTIVERT) 25 MG tablet Take 25 mg by mouth as needed.  . Multiple Vitamin (MULTIVITAMIN) tablet Take 1 tablet by mouth daily.  . Omega-3 Fatty Acids (FISH OIL) 1000 MG CAPS Take 1,000 mg by mouth daily as needed.   Marland Kitchen omeprazole (PRILOSEC) 40 MG capsule Take 40 mg by mouth daily.  . polyethylene glycol (MIRALAX / GLYCOLAX) packet Take 17 g by mouth daily.  . Rivaroxaban (XARELTO) 20 MG TABS Take 1 tablet (20 mg total) by mouth daily.  . vitamin B-12 (CYANOCOBALAMIN) 100 MCG tablet Take 100 mcg by mouth daily.   Marland Kitchen zolpidem (AMBIEN) 10 MG tablet Take 10 mg by mouth at bedtime as needed.    Review of Systems  Constitutional: Negative.   HENT: Negative.   Eyes: Negative.   Respiratory: Negative.   Cardiovascular: Positive for palpitations.  Gastrointestinal: Negative.   Endocrine: Negative.   Musculoskeletal: Negative.   Skin: Negative.   Allergic/Immunologic: Negative.   Neurological: Negative.   Hematological: Negative.   Psychiatric/Behavioral: Negative.   All other systems reviewed and are negative.   BP 142/80  Pulse 65  Ht 5\' 10"  (1.778 m)  Wt 195 lb 8 oz (88.678 kg)  BMI 28.05 kg/m2  Physical Exam  Nursing note and vitals reviewed. Constitutional: He is oriented to person, place, and time.  He appears well-developed and well-nourished.  HENT:  Head: Normocephalic.  Nose: Nose normal.  Mouth/Throat: Oropharynx is clear and moist.  Eyes: Conjunctivae are normal. Pupils are equal, round, and reactive to light.  Neck: Normal range of motion. Neck supple. No JVD present.  Cardiovascular: Normal rate, S1 normal, S2 normal, normal heart sounds and intact distal pulses.  An irregularly irregular rhythm present. Exam reveals no gallop and no friction rub.   No  murmur heard. Pulmonary/Chest: Effort normal and breath sounds normal. No respiratory distress. He has no wheezes. He has no rales. He exhibits no tenderness.  Abdominal: Soft. Bowel sounds are normal. He exhibits no distension. There is no tenderness.  Musculoskeletal: Normal range of motion. He exhibits no edema and no tenderness.  Lymphadenopathy:    He has no cervical adenopathy.  Neurological: He is alert and oriented to person, place, and time. Coordination normal.  Skin: Skin is warm and dry. No rash noted. No erythema.  Psychiatric: He has a normal mood and affect. His behavior is normal. Judgment and thought content normal.      Assessment and Plan

## 2013-12-21 NOTE — Assessment & Plan Note (Signed)
For his atrial fibrillation we have suggested he increase flecainide up to 100 mg twice a day. Initially we suggested 75 mg twice a day that he did not want to cut the medication in half. We will also take diltiazem 30 mg as needed for breakthrough arrhythmia.

## 2013-12-21 NOTE — Patient Instructions (Signed)
You are doing well.  Please increase the flecainide to 100 mg twice a day If you have breakthrough atrial fibrillation, Take diltiazem 30 mg pill If it does not go back to normal rhythym,  Take an extra 1/2 flecainide   Please call us if you have new issues that need to be addressed before your next appt.  Your physician wants you to follow-up in: 6 months.  You will receive a reminder letter in the mail two months in advance. If you don't receive a letter, please call our office to schedule the follow-up appointment.

## 2013-12-21 NOTE — Assessment & Plan Note (Signed)
Blood pressure is well controlled on today's visit. No changes made to the medications. 

## 2013-12-21 NOTE — Assessment & Plan Note (Signed)
Previous history of obstructive sleep apnea. He is not using his CPAP.

## 2014-04-19 ENCOUNTER — Telehealth: Payer: Self-pay | Admitting: *Deleted

## 2014-04-19 NOTE — Telephone Encounter (Signed)
2 days

## 2014-04-19 NOTE — Telephone Encounter (Signed)
Ginger with Dr. Allen Norris at [facility] is having a colonoscopy on 05/09/14 and needs to stop xarelto for how many days? Fax to [phone]

## 2014-04-20 NOTE — Telephone Encounter (Signed)
Faxed to [facility] at [phone].

## 2014-05-09 ENCOUNTER — Ambulatory Visit: Payer: Self-pay | Admitting: Gastroenterology

## 2014-05-11 HISTORY — PX: COLONOSCOPY: SHX174

## 2014-05-12 LAB — PATHOLOGY REPORT

## 2014-06-05 ENCOUNTER — Ambulatory Visit: Payer: Medicare HMO | Admitting: Cardiovascular Disease

## 2014-06-19 LAB — BASIC METABOLIC PANEL
BUN: 14 mg/dL (ref 4–21)
CREATININE: 1.2 mg/dL (ref 0.6–1.3)
Glucose: 120 mg/dL
POTASSIUM: 4.2 mmol/L (ref 3.4–5.3)
Sodium: 140 mmol/L (ref 137–147)

## 2014-06-25 ENCOUNTER — Telehealth: Payer: Self-pay | Admitting: *Deleted

## 2014-06-25 NOTE — Telephone Encounter (Signed)
Placed in Ignacia Bayley, NP's folder to be signed today.

## 2014-06-25 NOTE — Telephone Encounter (Signed)
Oral surgeon sent request for mediation to be stopped. Please call patient.

## 2014-06-25 NOTE — Telephone Encounter (Signed)
Oral surgeon's office called also, Dr. Mearl Latin, requesting status of clearance faxed on the 5th. Their # [phone] Attn Pam, fax [phone] .

## 2014-06-26 NOTE — Telephone Encounter (Signed)
Faxed cardiac clearance request to [facility] for extraction tooth w/ IV sedation & local anesthesia (lidocaine & epi) and for pt to hold xarelto prior to surgery.  Per Ignacia Bayley, NP, "Pt may come off of Xarelto for 2 days peri-operatively.  He does have a h/o stroke, thus Xarelto should be resumed post-operatively as soon as it is feasible from your perspective.  Faxed to Pam's attention at [phone].

## 2014-07-06 ENCOUNTER — Encounter: Payer: Self-pay | Admitting: Cardiovascular Disease

## 2014-07-06 ENCOUNTER — Ambulatory Visit (INDEPENDENT_AMBULATORY_CARE_PROVIDER_SITE_OTHER): Payer: Medicare HMO | Admitting: Cardiovascular Disease

## 2014-07-06 VITALS — BP 120/64 | HR 56 | Ht 70.0 in | Wt 193.5 lb

## 2014-07-06 DIAGNOSIS — I480 Paroxysmal atrial fibrillation: Secondary | ICD-10-CM

## 2014-07-06 DIAGNOSIS — I4891 Unspecified atrial fibrillation: Secondary | ICD-10-CM

## 2014-07-06 DIAGNOSIS — R0602 Shortness of breath: Secondary | ICD-10-CM

## 2014-07-06 DIAGNOSIS — R5381 Other malaise: Secondary | ICD-10-CM

## 2014-07-06 DIAGNOSIS — R42 Dizziness and giddiness: Secondary | ICD-10-CM

## 2014-07-06 DIAGNOSIS — I10 Essential (primary) hypertension: Secondary | ICD-10-CM

## 2014-07-06 DIAGNOSIS — R5383 Other fatigue: Secondary | ICD-10-CM

## 2014-07-06 NOTE — Progress Notes (Signed)
Patient ID: Ian Sanders, male    DOB: Jun 28, 1941, 73 y.o.   MRN: [id number]  HPI Comments: Mr. 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 Caryn Section. He presents for evaluation of his atrial fibrillation. History of obstructive sleep apnea, does not use CPAP  In followup today, he reports that he has not had any further episodes of atrial fibrillation since his flecainide was increased up to 100 mg twice a day. Denies any tachycardia or palpitations. He does report having significant migraines. He describes this as a sharp fleeting pain in the back of his eyes it resolves relatively quickly. Very rarely he has headaches that linger  Recently started on lisinopril 5 mg for blood pressure. Blood pressure has well-controlled since then. No lightheadedness  Is not active, does not do any regular exercise. Reports that recently he climbed a ladder and when he got down his legs were very shaky. Previously reported having significant sleep problems, sleep apnea.  He is taking xarelto 20 mg daily   No significant shortness of breath or chest pain  Echocardiogram May 2011 showed mildly dilated left atrium, no significant valvular disease, normal ejection fraction. Carotid ultrasound shows small calcified plaque bilaterally with no significant stenosis, May 2011   blood work shows total cholesterol 157, LDL 52, HDL 26, normal LFTs  EKG shows normal sinus rhythm with rate 56 beats per minute, no significant ST or T wave changes   Outpatient Encounter Prescriptions as of 07/06/2014  Medication Sig  . atenolol-chlorthalidone (TENORETIC) 50-25 MG per tablet Take 1  tablet by mouth daily.  . digoxin (LANOXIN) 0.125 MG tablet Take 1 tablet (125 mcg total) by mouth daily.  . flecainide (TAMBOCOR) 100 MG tablet Take 1 tablet (100 mg total) by mouth 2 (two) times daily.  Marland Kitchen Leuprolide Acetate, 6 Month, (LUPRON DEPOT) 45 MG injection Inject 45 mg into the muscle every 6 (six) months.  Marland Kitchen lisinopril (PRINIVIL,ZESTRIL) 5 MG tablet Take 5 mg by mouth daily.  Marland Kitchen omeprazole (PRILOSEC) 40 MG capsule Take 40 mg by mouth daily.  . polyethylene glycol (MIRALAX / GLYCOLAX) packet Take 17 g by mouth daily.  . Rivaroxaban (XARELTO) 20 MG TABS tablet Take 1 tablet (20 mg total) by mouth daily.  Marland Kitchen zolpidem (AMBIEN) 10 MG tablet Take 10 mg by mouth at bedtime as needed.  . [DISCONTINUED] Multiple Vitamin (MULTIVITAMIN) tablet Take 1 tablet by mouth daily.  . [DISCONTINUED] cyclobenzaprine (FLEXERIL) 5 MG tablet Take 5 mg by mouth as needed for muscle spasms.  . [DISCONTINUED] diltiazem (CARDIZEM) 30 MG tablet Take 1 tablet (30 mg total) by mouth 3 (three) times daily as needed. For fast heart rate  . [DISCONTINUED] Magnesium 400 MG CAPS Take 400 mg by mouth as needed.  . [DISCONTINUED] meclizine (ANTIVERT) 25 MG tablet Take 25 mg by mouth as needed.  . [DISCONTINUED] Omega-3 Fatty Acids (FISH OIL) 1000 MG CAPS Take 1,000 mg by mouth daily as needed.   . [DISCONTINUED] vitamin B-12 (CYANOCOBALAMIN) 100 MCG tablet Take 100 mcg by mouth daily.     Review of Systems  Constitutional: Negative.   HENT: Negative.   Eyes: Negative.   Respiratory: Negative.  Cardiovascular: Negative.   Gastrointestinal: Negative.   Endocrine: Negative.   Musculoskeletal: Negative.   Skin: Negative.   Allergic/Immunologic: Negative.   Neurological: Negative.   Hematological: Negative.   Psychiatric/Behavioral: Negative.   All other systems reviewed and are negative.  BP 120/64  Pulse 56  Ht 5\' 10"  (1.778 m)  Wt 193 lb 8 oz (87.771 kg)  BMI 27.76 kg/m2  Physical Exam  Nursing note and  vitals reviewed. Constitutional: He is oriented to person, place, and time. He appears well-developed and well-nourished.  HENT:  Head: Normocephalic.  Nose: Nose normal.  Mouth/Throat: Oropharynx is clear and moist.  Eyes: Conjunctivae are normal. Pupils are equal, round, and reactive to light.  Neck: Normal range of motion. Neck supple. No JVD present.  Cardiovascular: Normal rate, S1 normal, S2 normal, normal heart sounds and intact distal pulses.  An irregularly irregular rhythm present. Exam reveals no gallop and no friction rub.   No murmur heard. Pulmonary/Chest: Effort normal and breath sounds normal. No respiratory distress. He has no wheezes. He has no rales. He exhibits no tenderness.  Abdominal: Soft. Bowel sounds are normal. He exhibits no distension. There is no tenderness.  Musculoskeletal: Normal range of motion. He exhibits no edema and no tenderness.  Lymphadenopathy:    He has no cervical adenopathy.  Neurological: He is alert and oriented to person, place, and time. Coordination normal.  Skin: Skin is warm and dry. No rash noted. No erythema.  Psychiatric: He has a normal mood and affect. His behavior is normal. Judgment and thought content normal.      Assessment and Plan

## 2014-07-06 NOTE — Assessment & Plan Note (Signed)
He denies any recent arrhythmia. No medication changes made. Doing better on higher dose flecainide

## 2014-07-06 NOTE — Assessment & Plan Note (Signed)
Blood pressure is well controlled on today's visit. No changes made to the medications. 

## 2014-07-06 NOTE — Patient Instructions (Signed)
You are doing well. No medication changes were made.  Please call us if you have new issues that need to be addressed before your next appt.  Your physician wants you to follow-up in: 6 months.  You will receive a reminder letter in the mail two months in advance. If you don't receive a letter, please call our office to schedule the follow-up appointment.   

## 2014-07-27 ENCOUNTER — Other Ambulatory Visit: Payer: Self-pay

## 2014-07-27 MED ORDER — DIGOXIN 125 MCG PO TABS: 125.0000 ug | ORAL_TABLET | Freq: Every day | ORAL | Status: DC

## 2014-08-02 ENCOUNTER — Other Ambulatory Visit: Payer: Self-pay | Admitting: *Deleted

## 2014-08-02 MED ORDER — DIGOXIN 125 MCG PO TABS: 125.0000 ug | ORAL_TABLET | Freq: Every day | ORAL | Status: DC

## 2014-08-02 NOTE — Telephone Encounter (Signed)
Needs prescription sent into [facility] 90 day supply

## 2014-08-19 ENCOUNTER — Emergency Department: Payer: Self-pay | Admitting: Emergency Medicine

## 2014-09-06 ENCOUNTER — Telehealth: Payer: Self-pay

## 2014-09-06 NOTE — Telephone Encounter (Signed)
Pt states he has misplaced his rx for Digoxin and needs a written prescription.  Pt would also like samples for Xarelto

## 2014-09-07 ENCOUNTER — Telehealth: Payer: Self-pay

## 2014-09-07 MED ORDER — DIGOXIN 125 MCG PO TABS: 125.0000 ug | ORAL_TABLET | Freq: Every day | ORAL | Status: DC

## 2014-09-07 NOTE — Telephone Encounter (Signed)
Notified patient's wife samples of xarelto 20 mg available to pick up with a new written Rx for digoxin.

## 2014-09-07 NOTE — Telephone Encounter (Signed)
Rx printed for 90 day supply for digoxin.

## 2014-09-07 NOTE — Telephone Encounter (Signed)
Patient needed a written Rx for digoxin.

## 2014-09-18 ENCOUNTER — Telehealth: Payer: Self-pay | Admitting: Cardiovascular Disease

## 2014-09-18 NOTE — Telephone Encounter (Deleted)
Pt walked in wanted to leave a message,  [facility] to get [facility]. Needs our rescond

## 2014-10-09 NOTE — Telephone Encounter (Signed)
error 

## 2014-12-04 ENCOUNTER — Telehealth: Payer: Self-pay | Admitting: Cardiovascular Disease

## 2014-12-04 NOTE — Telephone Encounter (Signed)
[facility] Fax number per patient: [phone] Please send attention:  Diona Browner

## 2014-12-04 NOTE — Telephone Encounter (Signed)
Spoke w/ pt.  He asks that we send EKG and Dr. Donivan Scull last office note to [place] [facility] to help him out w/ his meds, as he has been NSR while in their office.

## 2014-12-04 NOTE — Telephone Encounter (Signed)
Attempted to contact pt to find out what he needs me to fax.  No answer, no machine.

## 2014-12-04 NOTE — Telephone Encounter (Signed)
Pt waked in stating he needs [place] to fax over something to [place] stating show that patient is Afib patient.  Patient will call back with fax number.

## 2015-02-28 LAB — CBC AND DIFFERENTIAL
HCT: 38 % — AB (ref 41–53)
Hemoglobin: 12.8 g/dL — AB (ref 13.5–17.5)
Platelets: 117 10*3/uL — AB (ref 150–399)
WBC: 5.9 10^3/mL

## 2015-03-05 NOTE — Op Note (Signed)
PATIENT NAME:  Ian Sanders, Ian Sanders MR#:  [id number] DATE OF BIRTH:  10/13/1941  DATE OF PROCEDURE:  08/17/2012  PREOPERATIVE DIAGNOSIS: Right inguinal hernia.   POSTOPERATIVE DIAGNOSIS: Right inguinal hernia.   PROCEDURE: Right inguinal hernia repair with mesh  SURGEON: Marlyce Huge, MD  ESTIMATED BLOOD LOSS: 10 mL.   ANESTHESIA: General.   INDICATION FOR SURGERY: Ian Sanders is a pleasant 74 year old male with a history of right inguinal hernia which is new onset which causes him pain. He was brought to the Operating Room for open right inguinal hernia repair with mesh.  DETAILS OF PROCEDURE: Ian Sanders was brought to the Operating Room. He was induced, endotracheal tube was placed, and anesthesia was administered. His right groin was then prepped and draped in standard surgical fashion and a time out was performed identifying the patient's name, operative site and procedure to be performed.   A transverse incision was made approximately one finger-breath above his pubic symphysis medial to the inguinal ligament. This was deepened down through Scarpa's fascia to the aponeurosis and external abdominal oblique. The aponeurosis was incised. It was opened with a pair of scissors. The cord was separated from the undersurface of the aponeurosis and surrounded with a Penrose drain. A large indirect sac was observed. There was no direct hernia. The sac was cleared of all attachments and reduced through the internal ring. A piece of Kugel mesh was then placed to reconstruct the floor in the keyhole. A single 2-0 Prolene U stitch was used to attach the mesh to the pubic tubercle. We then used interrupted U stitches to attach the medial part of the mesh to the conjoined tendon and a running Prolene was used to attach the mesh to the shelving edge of the inguinal ligament. The keyhole was then reconstructed. The keyhole was then closed using a 2-0 Prolene U stitch. It was approximately large enough to  fit the tip of a [employer] The wound was irrigated. The aponeurosis was then closed with a running 3-0 Vicryl. The Scarpa's was then closed with interrupted figure-of-eight 3-0 Vicryls and deep 3-0 Vicryls were used to approximate the skin. We then closed the skin with a running 4-0 Monocryl subcuticular and Dermabond. He was then awakened and brought to the postanesthesia care unit. There were no immediate complications. Needle, sponge, and instrument counts were correct at the end of the procedure. He received approximately 30 mL of local anesthetic.  ____________________________ Glena Norfolk. Tania Perrott, MD cal:slb D: 08/17/2012 16:52:46 ET T: 08/17/2012 17:41:51 ET JOB#: [id number]  cc: Harrell Gave A. Johnie Makki, MD, <Dictator> Floyde Parkins MD ELECTRONICALLY SIGNED 08/19/2012 18:31

## 2015-04-02 ENCOUNTER — Ambulatory Visit: Payer: PPO | Attending: Otolaryngology | Admitting: Physical Therapy

## 2015-04-02 ENCOUNTER — Encounter: Payer: Self-pay | Admitting: Physical Therapy

## 2015-04-02 DIAGNOSIS — R2689 Other abnormalities of gait and mobility: Secondary | ICD-10-CM

## 2015-04-02 DIAGNOSIS — R29818 Other symptoms and signs involving the nervous system: Secondary | ICD-10-CM | POA: Insufficient documentation

## 2015-04-02 DIAGNOSIS — R42 Dizziness and giddiness: Secondary | ICD-10-CM | POA: Insufficient documentation

## 2015-04-02 NOTE — Patient Instructions (Signed)
Tandem Stance   Right foot in front of left, heel touching toe both feet "straight ahead". Stand on Foot Triangle of Support with both feet. Balance in this position ___ seconds. Do with left foot in front of right.  ** Try single leg with eyes open as well. Try this with your eyes closed as tolerated (the tandem stance).   Copyright  VHI. All rights reserved.

## 2015-04-03 NOTE — Therapy (Addendum)
[place] [facility]IN [facility] SERVICES [place], [place], [address] Phone: [phone]   Fax:  [phone]  Physical Therapy Evaluation  Patient Details  Name: Ian Sanders MRN: [id number] Date of Birth: [birth year] Referring Provider:  Carloyn Manner, MD  Encounter Date: 04/02/2015    Past Medical History  Diagnosis Date  . Cramp of limb   . Mononeuritis of unspecified site   . Other seborrheic keratosis   . Insomnia, unspecified   . Allergic rhinitis, cause unspecified   . Esophageal reflux   . Unspecified essential hypertension   . Benign paroxysmal positional vertigo   . Dizziness and giddiness   . Headache(784.0)   . 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 Atrial fibrillation   . Sprain of unspecified site of sacroiliac region   . Unspecified hypothyroidism   . Pure hyperglyceridemia   . Lumbago   . Other malaise and fatigue   . Stroke     Past Surgical History  Procedure Laterality Date  . Hernia repair    . Prostatectomy      There were no vitals filed for this visit.  Visit Diagnosis:  Dizziness - Plan: PT plan of care cert/re-cert  Balance problem - Plan: PT plan of care cert/re-cert          [facility] PT Assessment - 04/17/15 1005    Assessment   Medical Diagnosis Imbalance   Onset Date/Surgical Date 01/02/10   Precautions   Precautions None   Home Environment   Living Environment Private residence   Living Arrangements Spouse/significant other   Available Help at Discharge Family   Type of [place] to enter   Entrance Stairs-Number of Steps 5   Entrance Stairs-Rails Can reach both   Prior Function   Level of Independence Independent   Cognition   Overall Cognitive Status Within Functional Limits for tasks assessed   Sensation   Light Touch Appears Intact   Sit to Stand   Comments --  5x sit to stand 16.96   Ambulation/Gait   Ambulation/Gait Assistance 7: Independent   Berg Balance Test   Sit to Stand Able to stand without using hands and stabilize independently   Standing Unsupported Able to stand safely 2 minutes   Sitting with Back Unsupported but Feet Supported on Floor or Stool Able to sit safely and securely 2 minutes   Stand to Sit Sits safely with minimal use of hands   Transfers Able to transfer safely, minor use of hands   Standing Unsupported with Eyes Closed Able to stand 10 seconds safely   Standing Ubsupported with Feet Together Able to place feet together independently and stand 1 minute safely   From Standing, Reach Forward with Outstretched Arm Can reach confidently >25 cm (10")   From Standing Position, Pick up Object from Floor Able to pick up shoe, needs supervision   From Standing Position, Turn to Look Behind Over each Shoulder Looks behind from both sides and weight shifts well   Turn 360 Degrees Able to turn 360 degrees safely in 4 seconds or less   Standing Unsupported, Alternately Place Feet on Step/Stool Able to stand independently and safely and complete 8 steps in 20 seconds   Standing Unsupported, One Foot in Front Able to take small step independently and hold 30 seconds   Standing on One Leg Able to lift leg independently and hold equal to or more  than 3 seconds   Total Score 51   Timed Up and Go Test   Normal TUG (seconds) 11.93   Functional Gait  Assessment   Gait Level Surface Walks 20 ft in less than 5.5 sec, no assistive devices, good speed, no evidence for imbalance, normal gait pattern, deviates no more than 6 in outside of the 12 in walkway width.   Change in Gait Speed Able to smoothly change walking speed without loss of balance or gait deviation. Deviate no more than 6 in outside of the 12 in walkway width.   Gait with Horizontal Head Turns Performs head turns smoothly with no change in gait. Deviates no more than 6 in outside 12 in walkway width   Gait  with Vertical Head Turns Performs head turns with no change in gait. Deviates no more than 6 in outside 12 in walkway width.   Gait and Pivot Turn Pivot turns safely within 3 sec and stops quickly with no loss of balance.   Step Over Obstacle Is able to step over 2 stacked shoe boxes taped together (9 in total height) without changing gait speed. No evidence of imbalance.   Gait with Narrow Base of Support Ambulates 7-9 steps.   Gait with Eyes Closed Walks 20 ft, uses assistive device, slower speed, mild gait deviations, deviates 6-10 in outside 12 in walkway width. Ambulates 20 ft in less than 9 sec but greater than 7 sec.   Ambulating Backwards Walks 20 ft, uses assistive device, slower speed, mild gait deviations, deviates 6-10 in outside 12 in walkway width.   Steps Alternating feet, no rail.   Total Score 27    HEP Provided  Tandem stance with head turns and eyes closed x 3 repetitions bilaterally (feet in front) in corner of room Single leg stance x 3-5 seconds in corner of room x 5 repetitions.                          PT Short Term Goals - 04/03/15 0933    PT SHORT TERM GOAL #1   Title Patient will complete vestibular evaluation at next visit to address his complaints of dizziness to increase his safety with dynamic mobility by 04/15/2015.    Status New           PT Long Term Goals - 04/02/15 1643    PT LONG TERM GOAL #1   Title Patient will be independent with a home exercise program to improve strength, power, and balance to improve safety and tolerance for dynamic mobility by 06/11/2015   Status New   PT LONG TERM GOAL #2   Title Patient will report minimal dizziness with dynamic mobility activities such as turning, sitting and standing, and maneuvering during ambulation through traffic by 06/11/2015   Status New   PT LONG TERM GOAL #3   Title Patient will demonstrate a full mobility squat with no knee valgus to pick up an object from the floor to  demonstrate increased strength, mobility, and balance during dynamic mobility by 06/11/2015   Status New   PT LONG TERM GOAL #4   Title Patient will decrease 5 time sit to stand time by at least 2 seconds to demonstrate improved strength and decreased risk of falling with dynamic mobility by 06/11/2015   Status New   PT LONG TERM GOAL #5   Title Patient will complete 3 exercise sessions with his wife at the senior center without loss of  balance to display safety with independent exercise and mobility by 06/11/2015.   Status New                Problem List Patient Active Problem List   Diagnosis Date Noted  . Hyperlipidemia 12/21/2013  . Groin pain, right lower quadrant 02/21/2013  . Preop cardiovascular exam 12/21/2012  . 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 Arrhythmia 12/14/2011  . HTN (hypertension) 12/14/2011  . Atrial fibrillation 12/14/2011   Kerman Passey, PT, DPT    04/17/2015, 10:06 AM  [place] [facility] SERVICES [address] [place], [place], [address] Phone: [phone]   Fax:  [phone]

## 2015-04-12 ENCOUNTER — Ambulatory Visit: Payer: PPO

## 2015-04-12 ENCOUNTER — Encounter: Payer: Self-pay | Admitting: Physical Therapy

## 2015-04-12 VITALS — BP 145/62 | HR 62

## 2015-04-12 DIAGNOSIS — R2689 Other abnormalities of gait and mobility: Secondary | ICD-10-CM

## 2015-04-12 DIAGNOSIS — R42 Dizziness and giddiness: Secondary | ICD-10-CM | POA: Diagnosis not present

## 2015-04-12 NOTE — Therapy (Signed)
[place] MAIN [facility] SERVICES [address] [place], [place], [address] Phone: [phone]   Fax:  [phone]  Physical Therapy Treatment  Patient Details  Name: Ian Sanders MRN: [id number] Date of Birth: January 02, 1941 Referring Provider:  Carloyn Manner, MD  Encounter Date: 04/12/2015      PT End of Session - 04/12/15 1101    Visit Number 2   Number of Visits 16   Date for PT Re-Evaluation 06/11/15   PT Start Time 0830   PT Stop Time 0920   PT Time Calculation (min) 50 min   Activity Tolerance Patient tolerated treatment well   Behavior During Therapy [facility] for tasks assessed/performed      Past Medical History  Diagnosis Date  . Cramp of limb   . Mononeuritis of unspecified site   . Other seborrheic keratosis   . Insomnia, unspecified   . Allergic rhinitis, cause unspecified   . Esophageal reflux   . Unspecified essential hypertension   . Benign paroxysmal positional vertigo   . Dizziness and giddiness   . Headache(784.0)   . 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 Atrial fibrillation   . Sprain of unspecified site of sacroiliac region   . Unspecified hypothyroidism   . Pure hyperglyceridemia   . Lumbago   . Other malaise and fatigue   . Stroke     Past Surgical History  Procedure Laterality Date  . Hernia repair    . Prostatectomy      Filed Vitals:   04/12/15 0839  BP: 145/62  Pulse: 62  SpO2: 99%    Visit Diagnosis:  Dizziness  Balance problem      Subjective Assessment - 04/12/15 0839    Subjective Pt reports that he has continued with symptoms since initial evaluation. He states that he is performing HEP and has no specific quesitons or concerns at this time.    Pertinent History Pt reports dizziness since CVA 5 years ago. He does not recall area of infarct but medical records report basal ganglia infarct. He describes symptoms as "dizziness" and  states that he feels like there is a "mismatch in his head." Denies vertigo but reports some shifting of visual field. Pt reports random onset of symptoms. Only known agrravating factors include positional changes, prolonged driving, looking overhead, and rolling in bed (sometimes). No konwn easing factors. Duration of symptoms is days. Last optomotrist appt is less than 1 year ago. Pt reports he has seen audiology with abnormal hearing tests but does not recall results. Pt complains of L tinnitis but denies aural fullness, pain, or drainage. Intermittent headaches at time of CVA but does not appear to patient to be associated with symptoms. Pt had what sounds like VNG testing with normal results per patient report. No medical records available. 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     Limitations Walking;Standing   Patient Stated Goals To improve balance so that he can attend fitness classes for seniors with his wife.    Currently in Pain? No/denies                Vestibular Assessment - 04/12/15 0001    Symptom Behavior   Type of Dizziness Blurred vision  diplopia, imbalance, unsteady with head/body turns   Frequency of Dizziness Daily, intermittent   Duration of Dizziness days   Occulomotor Exam   Occulomotor Alignment Normal   Spontaneous Absent  Gaze-induced Absent   Head shaking Horizontal Absent   Smooth Pursuits Saccades   Saccades Intact   Comment Head thrust positive to the R   Vestibulo-Occular Reflex   VOR 1 Head Only (x 1 viewing) Positive for blurring and dizziness   VOR Cancellation Unable to maintain gaze  blurring and dizziness        TREATMENT: See above for vestibular screening testing that was completed with patient. In addition pt was issued VOR x 1 horizontal in sitting 1 min x 3, 4 times/day. Performed exercise with patient to ensure correct technique. Pt reports positive reproduction of symptoms. Discussion with  primary therapist and patient regarding vestibular findings.                 PT Education - 04/12/15 1100    Education provided Yes   Education Details Continue HEP. Added VOR x 1 horizontal in sitting with marker on plain background. Pt educated about vestibular findings and that symptoms appear to be more consistent with centrally mediated dizziness.    Person(s) Educated Patient   Methods Explanation;Demonstration;Handout   Comprehension Verbalized understanding;Returned demonstration;Verbal cues required;Tactile cues required          PT Short Term Goals - 04/03/15 0933    PT SHORT TERM GOAL #1   Title Patient will complete vestibular evaluation at next visit to address his complaints of dizziness to increase his safety with dynamic mobility by 04/15/2015.    Status New           PT Long Term Goals - 04/02/15 1643    PT LONG TERM GOAL #1   Title Patient will be independent with a home exercise program to improve strength, power, and balance to improve safety and tolerance for dynamic mobility by 06/11/2015   Status New   PT LONG TERM GOAL #2   Title Patient will report minimal dizziness with dynamic mobility activities such as turning, sitting and standing, and maneuvering during ambulation through traffic by 06/11/2015   Status New   PT LONG TERM GOAL #3   Title Patient will demonstrate a full mobility squat with no knee valgus to pick up an object from the floor to demonstrate increased strength, mobility, and balance during dynamic mobility by 06/11/2015   Status New   PT LONG TERM GOAL #4   Title Patient will decrease 5 time sit to stand time by at least 2 seconds to demonstrate improved strength and decreased risk of falling with dynamic mobility by 06/11/2015   Status New   PT LONG TERM GOAL #5   Title Patient will complete 3 exercise sessions with his wife at the senior center without loss of balance to display safety with independent exercise and mobility by  06/11/2015.   Status New               Plan - 04/12/15 1102    Clinical Impression Statement Pt demonstrates positive R head thrust and positive VOR testing which could indicate some vestibular involvement in symptoms. However, more evidence for central causes of symptoms which is supported heavily by history as well as objective findings. Central signs include abnormal smooth pursuit, mild trajectory issues with horizontal saccades , and positive VOR cancellation. Pt will benefit from continued PT for balance training with integration of VOR progression for possible vestibular hypofunction. Findings communicated with primary treating therapist.    Rehab Potential Excellent   PT Next Visit Plan VOR proression in addition to generl balance and strengthening. Perform  firm/foam eyes open/closed testing for vestibular dysfunction at next visit.    PT Home Exercise Plan See in patient instructions.    Consulted and Agree with Plan of Care Patient        Problem List Patient Active Problem List   Diagnosis Date Noted  . Hyperlipidemia 12/21/2013  . Groin pain, right lower quadrant 02/21/2013  . Preop cardiovascular exam 12/21/2012  . 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 Arrhythmia 12/14/2011  . HTN (hypertension) 12/14/2011  . Atrial fibrillation 12/14/2011   Phillips Grout PT, DPT   Huprich,Jason 04/12/2015, 11:11 AM  [place] MAIN [facility] SERVICES [address] [place], [place], [address] Phone: [phone]   Fax:  [phone]

## 2015-04-17 ENCOUNTER — Ambulatory Visit: Payer: PPO | Attending: Family Medicine | Admitting: Physical Therapy

## 2015-04-17 ENCOUNTER — Encounter: Payer: Self-pay | Admitting: Physical Therapy

## 2015-04-17 DIAGNOSIS — R29818 Other symptoms and signs involving the nervous system: Secondary | ICD-10-CM | POA: Insufficient documentation

## 2015-04-17 DIAGNOSIS — R2689 Other abnormalities of gait and mobility: Secondary | ICD-10-CM

## 2015-04-17 DIAGNOSIS — R42 Dizziness and giddiness: Secondary | ICD-10-CM | POA: Insufficient documentation

## 2015-04-17 NOTE — Patient Instructions (Addendum)
All exercises provided were adapted from [email or web address]. Patient was provided a written handout with pictures as described. Any additional cues were manually entered in to handout and copied in to this document.    Balance Semi Tandem Wide - Head Turns & Eyes Closed  (3 times, 10 seconds, 2 set, 1x per day)   SETUP -Stand with one foot in front of the other as if on either side of a thin line (see picture) -Hands on hips -Eyes closed   ACTION -Turn head from one side to the other -turning head to left shoulder then right shoulder then back to the middle is one repetition  FOCUS -Stay tall and facing forward -Relaxed breathing  REPEAT ON OPPOSITE SIDE     LOOPED ELASTIC BAND HIP ABDUCTION  (12 times, 2 seconds, 2 set, 1x per day)   While standing with an elastic band looped around your ankles, move the target leg out to the side as shown.      Tandem Stance  (5 times, 30 seconds, 1 set, 1x per day)   "In a corner, practice standing "heel to toe" with EYES OPEN.  (One foot in front of the other with the heel of one foot touching the toe of the other foot) The goal is to stand for the entire time without touching the wall. If this is too hard at first, try standing "almost heel to toe" (with feet touching at big toes to the inside of your ankle)."     VOR x1 (horizontal) (5 times, 30 seconds, 1 set, 2x per day)   Keep eyes focused on the letter.  Slowly, begin moving you head SIDE to SIDE.  *Make sure to keep the letter in [place].

## 2015-04-17 NOTE — Therapy (Signed)
[place] MAIN [facility] SERVICES [address] [place], [place], [address] Phone: [phone]   Fax:  [phone]  Physical Therapy Treatment  Patient Details  Name: Ian Sanders MRN: [id number] Date of Birth: 11/22/1940 Referring Provider:  Birdie Sons, MD  Encounter Date: 04/17/2015      PT End of Session - 04/17/15 1312    Visit Number 3   Number of Visits 16   Date for PT Re-Evaluation 06/11/15   PT Start Time 6144   PT Stop Time 1120   PT Time Calculation (min) 45 min   Equipment Utilized During Treatment Gait belt   Activity Tolerance Patient tolerated treatment well   Behavior During Therapy [facility] for tasks assessed/performed      Past Medical History  Diagnosis Date  . Cramp of limb   . Mononeuritis of unspecified site   . Other seborrheic keratosis   . Insomnia, unspecified   . Allergic rhinitis, cause unspecified   . Esophageal reflux   . Unspecified essential hypertension   . Benign paroxysmal positional vertigo   . Dizziness and giddiness   . Headache(784.0)   . 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 Atrial fibrillation   . Sprain of unspecified site of sacroiliac region   . Unspecified hypothyroidism   . Pure hyperglyceridemia   . Lumbago   . Other malaise and fatigue   . Stroke     Past Surgical History  Procedure Laterality Date  . Hernia repair    . Prostatectomy      There were no vitals filed for this visit.  Visit Diagnosis:  Dizziness  Balance problem      Subjective Assessment - 04/17/15 1038    Subjective Patient reports dizziness has improved some since his last visit, but not much. He reports he has not been as diligent with his HEP as he'd like.    Patient Stated Goals To improve balance so that he can attend fitness classes for seniors with his wife.        From Eval, did not initially copy into evaluation.      [facility] PT  Assessment - 04/17/15 1005    Assessment   Medical Diagnosis Imbalance   Onset Date/Surgical Date 01/02/10   Precautions   Precautions None   Home Environment   Living Environment Private residence   Living Arrangements Spouse/significant other   Available Help at Discharge Family   Type of [place] to enter   Entrance Stairs-Number of Steps 5   Entrance Stairs-Rails Can reach both   Prior Function   Level of Independence Independent   Cognition   Overall Cognitive Status Within Functional Limits for tasks assessed   Sensation   Light Touch Appears Intact   Sit to Stand   Comments --  5x sit to stand 16.96   Ambulation/Gait   Ambulation/Gait Assistance 7: Independent   Berg Balance Test   Sit to Stand Able to stand without using hands and stabilize independently   Standing Unsupported Able to stand safely 2 minutes   Sitting with Back Unsupported but Feet Supported on Floor or Stool Able to sit safely and securely 2 minutes   Stand to Sit Sits safely with minimal use of hands   Transfers Able to transfer safely, minor use of hands   Standing Unsupported with Eyes Closed Able to stand 10 seconds safely   Standing Ubsupported with  Feet Together Able to place feet together independently and stand 1 minute safely   From Standing, Reach Forward with Outstretched Arm Can reach confidently >25 cm (10")   From Standing Position, Pick up Object from Floor Able to pick up shoe, needs supervision   From Standing Position, Turn to Look Behind Over each Shoulder Looks behind from both sides and weight shifts well   Turn 360 Degrees Able to turn 360 degrees safely in 4 seconds or less   Standing Unsupported, Alternately Place Feet on Step/Stool Able to stand independently and safely and complete 8 steps in 20 seconds   Standing Unsupported, One Foot in Front Able to take small step independently and hold 30 seconds   Standing on One Leg Able to lift leg independently  and hold equal to or more than 3 seconds   Total Score 51   Timed Up and Go Test   Normal TUG (seconds) 11.93   Functional Gait  Assessment   Gait Level Surface Walks 20 ft in less than 5.5 sec, no assistive devices, good speed, no evidence for imbalance, normal gait pattern, deviates no more than 6 in outside of the 12 in walkway width.   Change in Gait Speed Able to smoothly change walking speed without loss of balance or gait deviation. Deviate no more than 6 in outside of the 12 in walkway width.   Gait with Horizontal Head Turns Performs head turns smoothly with no change in gait. Deviates no more than 6 in outside 12 in walkway width   Gait with Vertical Head Turns Performs head turns with no change in gait. Deviates no more than 6 in outside 12 in walkway width.   Gait and Pivot Turn Pivot turns safely within 3 sec and stops quickly with no loss of balance.   Step Over Obstacle Is able to step over 2 stacked shoe boxes taped together (9 in total height) without changing gait speed. No evidence of imbalance.   Gait with Narrow Base of Support Ambulates 7-9 steps.   Gait with Eyes Closed Walks 20 ft, uses assistive device, slower speed, mild gait deviations, deviates 6-10 in outside 12 in walkway width. Ambulates 20 ft in less than 9 sec but greater than 7 sec.   Ambulating Backwards Walks 20 ft, uses assistive device, slower speed, mild gait deviations, deviates 6-10 in outside 12 in walkway width.   Steps Alternating feet, no rail.   Total Score 27     Neuromuscular Re-education (Performed today  Sit to stand 4 sets of 5 repetitions. Cuing for having his feet further in front of him, and pushing through his heels. Cued to stand with trunk more vertically to increase hip extensor torque.  Tandem stance x 4 bilaterally (Continued to fall to his right, though he was able to maintain for ~ 30 seconds bilaterally VOR 1 sitting, standing  (vertical and horizontal), no issues with vertical.  Completed ~ 30 seconds of VOR x 1 horizontally with some dizziness noted with faster speed.  Tandem stance eyes closed (Able to hold ~ 10 seconds bilaterally, fell to the right multiple times. He was able to catch himself with a stepping method.)  Blue foam eyes closed, then narrow BOS eyes closed. Cued to feel what part of his foot he was feeling pressure in and adjust accordingly with his hips and shoulders. Performed x 5 repetitions   Forward step ups x 10 bilaterally  (cuing for hip extension by keeping trunk vertical)  Lateral  step ups x 10 bilaterally (cuing for hip extension by keeping trunk vertical)  Standing hip abductions with red t-band, x 8 bilaterally. Some toe out noted, provided yellow t-band for HEP.  Tandem stance ambulation in // bars x 10' for 2 repetitions, well tolerated.  Tandem stance ambulation forward and retro x 20', struggled with backwards initially. Cued to perform slower and bear weight through midfoot and not laterally.                          PT Education - 04/17/15 1310    Education provided Yes   Education Details Patient educated to continue with VOR exercises, HEP, and make sure not to get dizzy with VOR exercises. Patient educated regarding the balance components (vestibular system, pressure receptors in feet, and vision).    Person(s) Educated Patient   Methods Explanation;Demonstration;Handout   Comprehension Verbalized understanding;Returned demonstration;Tactile cues required;Verbal cues required          PT Short Term Goals - 04/17/15 1315    PT SHORT TERM GOAL #1   Title Patient will complete vestibular evaluation at next visit to address his complaints of dizziness to increase his safety with dynamic mobility by 04/15/2015.    Status Achieved           PT Long Term Goals - 04/17/15 1315    PT LONG TERM GOAL #1   Title Patient will be independent with a home exercise program to improve strength, power, and balance to  improve safety and tolerance for dynamic mobility by 06/11/2015   Status Partially Met   PT LONG TERM GOAL #2   Title Patient will report minimal dizziness with dynamic mobility activities such as turning, sitting and standing, and maneuvering during ambulation through traffic by 06/11/2015   Status On-going   PT LONG TERM GOAL #3   Title Patient will demonstrate a full mobility squat with no knee valgus to pick up an object from the floor to demonstrate increased strength, mobility, and balance during dynamic mobility by 06/11/2015   Status On-going   PT LONG TERM GOAL #4   Title Patient will decrease 5 time sit to stand time by at least 2 seconds to demonstrate improved strength and decreased risk of falling with dynamic mobility by 06/11/2015   Status On-going   PT LONG TERM GOAL #5   Title Patient will complete 3 exercise sessions with his wife at the senior center without loss of balance to display safety with independent exercise and mobility by 06/11/2015.   Status On-going               Plan - 04/17/15 1313    Clinical Impression Statement Patient demonstrates decreased R vestibular functioning in previous testing, and continued to fall to the right today with balance activities. Patient does experience increased dizziness with VOR exercises today, and was instructed to decrease speed and number of VOR exercises completed. Patient displays decreased balance with tandem stance and with narrow BOS and his eyes closed. Skilled PT services are indicated to improve his balance and safety with mobility.    Pt will benefit from skilled therapeutic intervention in order to improve on the following deficits Difficulty walking;Decreased balance;Other (comment)   Rehab Potential Excellent   Clinical Impairments Affecting Rehab Potential High level of function at baseline.    PT Frequency 2x / week   PT Duration 8 weeks   PT Treatment/Interventions ADLs/Self Care Home Management;Therapeutic  activities;Therapeutic exercise;Manual  techniques;Neuromuscular re-education;Gait training;Other (comment)   PT Next Visit Plan VOR progression, gluteal strengthening,and general balance activities in narrow BOS and eyes closed. Continue to add in foam.    PT Home Exercise Plan See in patient instructions.    Consulted and Agree with Plan of Care Patient        Problem List Patient Active Problem List   Diagnosis Date Noted  . Hyperlipidemia 12/21/2013  . Groin pain, right lower quadrant 02/21/2013  . Preop cardiovascular exam 12/21/2012  . 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 Arrhythmia 12/14/2011  . HTN (hypertension) 12/14/2011  . Atrial fibrillation 12/14/2011   Kerman Passey, PT, DPT   04/17/2015, 1:17 PM  [place] MAIN [facility] SERVICES [address] [place], [place], [address] Phone: [phone]   Fax:  [phone]

## 2015-04-22 ENCOUNTER — Telehealth: Payer: Self-pay | Admitting: *Deleted

## 2015-04-22 NOTE — Telephone Encounter (Signed)
Pt called back and appt has been moved to 1:45 pm tomorrow 04/23/15. Thanks TNP

## 2015-04-22 NOTE — Telephone Encounter (Signed)
Called patient requesting that he reschedule appt for 1:45 pm 04/23/2015. Per Dr. Caryn Section. LMOVM for pt to return call.

## 2015-04-23 ENCOUNTER — Encounter: Payer: Self-pay | Admitting: Family Medicine

## 2015-04-23 ENCOUNTER — Ambulatory Visit (INDEPENDENT_AMBULATORY_CARE_PROVIDER_SITE_OTHER): Payer: PPO | Admitting: Family Medicine

## 2015-04-23 VITALS — BP 120/70 | HR 60 | Temp 98.6°F | Resp 16 | Ht 70.5 in | Wt 196.0 lb

## 2015-04-23 DIAGNOSIS — R5383 Other fatigue: Secondary | ICD-10-CM

## 2015-04-23 DIAGNOSIS — K219 Gastro-esophageal reflux disease without esophagitis: Secondary | ICD-10-CM | POA: Diagnosis not present

## 2015-04-23 DIAGNOSIS — G4700 Insomnia, unspecified: Secondary | ICD-10-CM

## 2015-04-23 DIAGNOSIS — E781 Pure hyperglyceridemia: Secondary | ICD-10-CM

## 2015-04-23 DIAGNOSIS — I10 Essential (primary) hypertension: Secondary | ICD-10-CM

## 2015-04-23 LAB — POCT GLYCOSYLATED HEMOGLOBIN (HGB A1C)
Est. average glucose Bld gHb Est-mCnc: 120
HEMOGLOBIN A1C: 5.8

## 2015-04-23 MED ORDER — CYANOCOBALAMIN 1000 MCG/ML IJ SOLN
1000.0000 ug | Freq: Once | INTRAMUSCULAR | Status: AC
  Administered 2015-04-23: 1000 ug via INTRAMUSCULAR

## 2015-04-23 MED ORDER — TRAZODONE HCL 100 MG PO TABS: 100.0000 mg | ORAL_TABLET | Freq: Every day | ORAL | Status: DC

## 2015-04-23 MED ORDER — ZOLPIDEM TARTRATE 10 MG PO TABS: 10.0000 mg | ORAL_TABLET | Freq: Every evening | ORAL | Status: DC | PRN

## 2015-04-23 MED ORDER — ATENOLOL-CHLORTHALIDONE 50-25 MG PO TABS: 1.0000 | ORAL_TABLET | Freq: Every day | ORAL | Status: DC

## 2015-04-23 MED ORDER — OMEPRAZOLE 40 MG PO CPDR: 40.0000 mg | DELAYED_RELEASE_CAPSULE | Freq: Every day | ORAL | Status: DC

## 2015-04-23 NOTE — Assessment & Plan Note (Signed)
Well controlled despite having stopped lisinopril due to tongue swelling. Continue atenolol-chlorthalidone and stay of ACEIs and ARBs for now.

## 2015-04-23 NOTE — Progress Notes (Signed)
Patient: Ian Sanders Male    DOB: 1940-12-23   74 y.o.   MRN: [id number] Visit Date: 04/23/2015  Today's Provider: Lelon Huh, MD   Chief Complaint  Patient presents with  . Medication Refill   Subjective:    HPI     Hypertension, follow-up:   BP Readings from Last 3 Encounters:  04/23/15 120/70  04/12/15 145/62  07/06/14 120/64     He was last seen for hypertension 6 months ago.   BP at that visit was 145/62 .  Management changes since that visit include Lisinopril placed on hold 03/11/2015 due to tongue swelling. He reports good compliance with treatment.  He is not having side effects. none  He is exercising.  He is not adherent to low salt diet.    Outside blood pressures are 145/75.  He is experiencing none.   Patient denies none.    Cardiovascular risk factors include advanced age (older than 64 for men, 68 for women), diabetes mellitus, dyslipidemia, hypertension and male gender.   Use of agents associated with hypertension: none.    Weight trend: stable Current diet: in general, a "healthy" diet      Diabetes Mellitus Type II, Follow-up:   No results found for: HGBA1C  Last seen for diabetes 6 months ago.  Management changes included None. He reports good compliance with treatment. He is not having side effects.  Current symptoms include none   Episodes of hypoglycemia? no   Current Insulin Regimen: n/a  Pertinent Labs:    Component Value Date/Time   CREATININE 1.36* 08/18/2012 0108    Wt Readings from Last 3 Encounters:  04/23/15 196 lb (88.905 kg)  07/06/14 193 lb 8 oz (87.771 kg)  12/21/13 195 lb 8 oz (88.678 kg)    Results for orders placed or performed in visit on 04/23/15  POCT HgB A1C  Result Value Ref Range   Hemoglobin A1C 5.8    Est. average glucose Bld gHb Est-mCnc 120      Insomnia He states that [place] is no longer working to keep him asleep. He avoids stimulants in the evenings and only ocnsumes coffee in the  morning.    Previous Medications   ATENOLOL-CHLORTHALIDONE (TENORETIC) 50-25 MG PER TABLET    Take 1 tablet by mouth daily.   BIOTIN PO    Take by mouth daily.   CALCIUM PO    Take by mouth daily.   CYANOCOBALAMIN (VITAMIN B-12 PO)    Take by mouth daily.   DIGOXIN (LANOXIN) 0.125 MG TABLET    Take 1 tablet (125 mcg total) by mouth daily.   FLECAINIDE (TAMBOCOR) 100 MG TABLET    Take 1 tablet (100 mg total) by mouth 2 (two) times daily.   LEUPROLIDE ACETATE, 6 MONTH, (LUPRON DEPOT) 45 MG INJECTION    Inject 45 mg into the muscle every 6 (six) months.   LISINOPRIL (PRINIVIL,ZESTRIL) 5 MG TABLET    Take 5 mg by mouth daily.   OMEPRAZOLE (PRILOSEC) 40 MG CAPSULE    Take 40 mg by mouth daily.   POLYETHYLENE GLYCOL (MIRALAX / GLYCOLAX) PACKET    Take 17 g by mouth daily.   POLYETHYLENE GLYCOL 3350 (MIRALAX PO)    Take by mouth.   RIVAROXABAN (XARELTO) 20 MG TABS TABLET    Take 1 tablet (20 mg total) by mouth daily.   ZOLPIDEM (AMBIEN) 10 MG TABLET    Take 10 mg by mouth at bedtime as needed.    Review  of Systems  History  Substance Use Topics  . Smoking status: Former Smoker -- 1.00 packs/day for 10 years    Quit date: 10/28/1971  . Smokeless tobacco: Former Systems developer    Types: Chew    Quit date: 01/02/2015  . Alcohol Use: 7.0 oz/week    14 Standard drinks or equivalent per week     Comment: moderate   Objective:   BP 120/70 mmHg  Pulse 60  Temp(Src) 98.6 F (37 C) (Oral)  Resp 16  Ht 5' 10.5" (1.791 m)  Wt 196 lb (88.905 kg)  BMI 27.72 kg/m2  SpO2 96%  Physical Exam   General Appearance:    Alert, cooperative, no distress  Eyes:    PERRL, conjunctiva/corneas clear, EOM's intact       Lungs:     Clear to auscultation bilaterally, respirations unlabored  Heart:    Regular rate and rhythm  Neurologic:   Awake, alert, oriented x 3. No apparent focal neurological           defect.                 Assessment & Plan:     Problem List Items Addressed This Visit    HTN  (hypertension)    Well controlled despite having stopped lisinopril due to tongue swelling. Continue atenolol-chlorthalidone and stay of ACEIs and ARBs for now.       Relevant Medications   atenolol-chlorthalidone (TENORETIC) 50-25 MG per tablet   Other Relevant Orders   Renal Function Panel   Hypertriglyceridemia   Relevant Medications   atenolol-chlorthalidone (TENORETIC) 50-25 MG per tablet   Other Relevant Orders      Lipid Profile  Type 2 diabetes. Well controlled    Other Visit Diagnoses    Insomnia  (Chronic)   -  Primary    Relevant Medications    traZODone (DESYREL) 100 MG tablet    Other Relevant Orders    POCT HgB A1C (Completed)    Gastroesophageal reflux disease, esophagitis presence not specified  : Well controlled.       Relevant Medications    Polyethylene Glycol 3350 (MIRALAX PO)    omeprazole (PRILOSEC) 40 MG capsule    Other fatigue     He states B12 injections have worked well for him in the past and he requests injection today. He states he tried OTC B12 by mouth which as not helped.    Relevant Medications    cyanocobalamin ((VITAMIN B-12)) injection 1,000 mcg (Completed)

## 2015-04-30 LAB — LIPID PANEL
CHOLESTEROL TOTAL: 150 mg/dL (ref 100–199)
Chol/HDL Ratio: 5.2 ratio units — ABNORMAL HIGH (ref 0.0–5.0)
HDL: 29 mg/dL — AB (ref >39)
LDL Calculated: 74 mg/dL (ref 0–99)
TRIGLYCERIDES: 236 mg/dL — AB (ref 0–149)
VLDL CHOLESTEROL CAL: 47 mg/dL — AB (ref 5–40)

## 2015-04-30 LAB — RENAL FUNCTION PANEL
ALBUMIN: 4.5 g/dL (ref 3.5–4.8)
BUN / CREAT RATIO: 9 — AB (ref 10–22)
BUN: 10 mg/dL (ref 8–27)
CALCIUM: 9.1 mg/dL (ref 8.6–10.2)
CO2: 31 mmol/L — ABNORMAL HIGH (ref 18–29)
CREATININE: 1.15 mg/dL (ref 0.76–1.27)
Chloride: 94 mmol/L — ABNORMAL LOW (ref 97–108)
GFR calc non Af Amer: 62 mL/min/{1.73_m2} (ref >59)
GFR, EST AFRICAN AMERICAN: 72 mL/min/{1.73_m2} (ref >59)
Glucose: 101 mg/dL — ABNORMAL HIGH (ref 65–99)
PHOSPHORUS: 3.9 mg/dL (ref 2.5–4.5)
POTASSIUM: 3.4 mmol/L — AB (ref 3.5–5.2)
Sodium: 141 mmol/L (ref 134–144)

## 2015-06-19 ENCOUNTER — Encounter: Payer: Self-pay | Admitting: *Deleted

## 2015-06-20 ENCOUNTER — Encounter: Payer: Self-pay | Admitting: Urology

## 2015-06-20 ENCOUNTER — Ambulatory Visit (INDEPENDENT_AMBULATORY_CARE_PROVIDER_SITE_OTHER): Payer: PPO | Admitting: Urology

## 2015-06-20 VITALS — BP 127/71 | HR 52 | Ht 70.0 in | Wt 196.8 lb

## 2015-06-20 DIAGNOSIS — C61 Malignant neoplasm of prostate: Secondary | ICD-10-CM | POA: Diagnosis not present

## 2015-06-20 DIAGNOSIS — R32 Unspecified urinary incontinence: Secondary | ICD-10-CM

## 2015-06-20 LAB — BLADDER SCAN AMB NON-IMAGING: Scan Result: 44

## 2015-06-20 MED ORDER — LEUPROLIDE ACETATE (6 MONTH) 45 MG IM KIT
45.0000 mg | PACK | Freq: Once | INTRAMUSCULAR | Status: AC
  Administered 2015-06-20: 45 mg via INTRAMUSCULAR

## 2015-06-20 NOTE — Progress Notes (Signed)
Lupron IM Injection   Due to Prostate Cancer patient is present today for a Lupron Injection.  Medication: Lupron 6 month Dose: 45 mg  Location: left upper outer buttocks Lot: [id number]  Exp: 10/13/2016 Patient tolerated well, no complications were noted    Bladder Scan Patient scan} void: 44 ml  Performed by: Pervis Hocking, CMA  Follow up: 6mths

## 2015-06-20 NOTE — Progress Notes (Signed)
06/20/2015 10:03 PM   Ian Sanders July 13, 1941 [id number]  Referring provider: Birdie Sons, MD [address] [place], [place] [address]  Chief Complaint  Patient presents with  . Prostate Cancer    f/u prostate cancer     HPI: Ian Sanders is a 74 year old white male with prostate cancer who presents today for his 6 months Lupron.  Prostate cancer: Patient underwent RRP by Dr. Domingo Cocking at [facility] in 1995. His PSA remained below 1 until 2014 when it rose to 9.8 ng/mL.  He was started on ADT with [place] in 03/15.  He has received [place] for 4 months and then was switched to a longer acting agent, Lupron.  He is having hot flashes, but he finds them manageable.  He is taking 600mg  of calcium twice daily and vitamin d 400mg  twice daily.  He reports a good appetite, no weight loss and no back pain.  His last PSA was <0.1 ng/mL on 12/20/2014.    Incontinence: Patient has had an increase of urinary frequency, nocturia and leakage over the last month.  He has not had dysuria, fevers, chills, nausea or vomiting.  He also denies suprapubic pain.    PMH: Past Medical History  Diagnosis Date  . Cramp of limb   . Mononeuritis of unspecified site   . Other seborrheic keratosis   . Insomnia, unspecified   . Allergic rhinitis, cause unspecified   . Esophageal reflux   . Unspecified essential hypertension   . Benign paroxysmal positional vertigo   . Dizziness and giddiness   . Headache(784.0)   . 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 Atrial fibrillation   . Sprain of unspecified site of sacroiliac region   . Unspecified hypothyroidism   . Pure hyperglyceridemia   . Lumbago   . Other malaise and fatigue   . Stroke   . Elevated PSA   . Right inguinal hernia     Surgical History: Past Surgical History  Procedure Laterality Date  . Hernia repair    . Prostatectomy      Home Medications:    Medication List      This list is accurate as of: 06/20/15 10:03 PM.  Always use your most recent med list.               atenolol-chlorthalidone 50-25 MG per tablet  Commonly known as:  TENORETIC  Take 1 tablet by mouth daily.     BIOTIN PO  Take by mouth daily.     calcium-vitamin D 250-125 MG-UNIT per tablet  Commonly known as:  OSCAL  Take by mouth.     EPIPEN 2-PAK 0.3 mg/0.3 mL Soaj injection  Generic drug:  EPINEPHrine  Inject 0.3 mg into the muscle.     flecainide 100 MG tablet  Commonly known as:  TAMBOCOR  Take 1 tablet (100 mg total) by mouth 2 (two) times daily.     MIRALAX PO  Take by mouth.     omeprazole 40 MG capsule  Commonly known as:  PRILOSEC  Take 40 mg by mouth.     rivaroxaban 20 MG Tabs tablet  Commonly known as:  XARELTO  Take by mouth.     traZODone 100 MG tablet  Commonly known as:  DESYREL  Take 1 tablet (100 mg total) by mouth at bedtime.     VITAMIN B-12 PO  Take by mouth daily.     zolpidem 10 MG  tablet  Commonly known as:  AMBIEN  Take 1 tablet (10 mg total) by mouth at bedtime as needed.        Allergies:  Allergies  Allergen Reactions  . Lisinopril Swelling    Tongue swelling  . Atorvastatin     Dizziness and Fatigue   . Dabigatran Etexilate Mesylate     Stomach ulcers (Pradaxa)    Family History: Family History  Problem Relation Age of Onset  . Heart disease Mother 9    CABG  . Hypertension Brother   . Heart disease Sister     stents  . Prostate cancer Brother   . Skin cancer Brother   . Kidney cancer Brother   . Bladder Cancer Sister   . Leukemia Father   . Hypertension Mother   . Cerebral aneurysm Sister     Social History:  reports that he quit smoking about 43 years ago. He quit smokeless tobacco use about 5 months ago. His smokeless tobacco use included Chew. He reports that he drinks about 7.0 oz of alcohol per week. He reports that he does not use illicit drugs.  ROS: UROLOGY Frequent Urination?: Yes Hard to  postpone urination?: Yes Burning/pain with urination?: No Get up at night to urinate?: Yes Leakage of urine?: Yes Urine stream starts and stops?: No Trouble starting stream?: No Do you have to strain to urinate?: No Blood in urine?: No Urinary tract infection?: No Sexually transmitted disease?: No Injury to kidneys or bladder?: No Painful intercourse?: No Weak stream?: No Erection problems?: Yes Penile pain?: No  Gastrointestinal Nausea?: No Vomiting?: No Indigestion/heartburn?: No Diarrhea?: No Constipation?: Yes  Constitutional Fever: No Night sweats?: No Weight loss?: No Fatigue?: Yes  Skin Skin rash/lesions?: No Itching?: No  Eyes Blurred vision?: No Double vision?: No  Ears/Nose/Throat Sore throat?: No Sinus problems?: No  Hematologic/Lymphatic Swollen glands?: No Easy bruising?: Yes  Cardiovascular Leg swelling?: No Chest pain?: No  Respiratory Cough?: No Shortness of breath?: No  Endocrine Excessive thirst?: No  Musculoskeletal Back pain?: No Joint pain?: No  Neurological Headaches?: No Dizziness?: No  Psychologic Depression?: No Anxiety?: No  Physical Exam: BP 127/71 mmHg  Pulse 52  Ht 5\' 10"  (1.778 m)  Wt 196 lb 12.8 oz (89.268 kg)  BMI 28.24 kg/m2  GU: Patient with uncircumcised phallus. Foreskin easily retracted  Urethral meatus is patent.  No penile discharge. No penile lesions or rashes. Scrotum without lesions, cysts, rashes and/or edema.  Testicles are located scrotally bilaterally. No masses are appreciated in the testicles. Left and right epididymis are normal. Rectal: Patient with  normal sphincter tone. Perineum without scarring or rashes. No rectal masses are appreciated. Prostatic vault is empty.   Laboratory Data: Results for orders placed or performed in visit on 06/20/15  Bladder Scan (Post Void Residual) in office  Result Value Ref Range   Scan Result 44    Lab Results  Component Value Date   WBC 9.2  08/18/2012   HGB 13.9 08/18/2012   HCT 40.1 08/18/2012   MCV 83 08/18/2012   PLT 105* 08/18/2012    Lab Results  Component Value Date   CREATININE 1.15 04/29/2015    No results found for: PSA  No results found for: TESTOSTERONE  Lab Results  Component Value Date   HGBA1C 5.8 04/23/2015    Urinalysis    Component Value Date/Time   COLORURINE Yellow 08/10/2012 1219   APPEARANCEUR Clear 08/10/2012 1219   LABSPEC 1.009 08/10/2012 [place]  7.0 08/10/2012 1219   GLUCOSEU Negative 08/10/2012 1219   HGBUR Negative 08/10/2012 1219   BILIRUBINUR Negative 08/10/2012 1219   KETONESUR Negative 08/10/2012 1219   PROTEINUR Negative 08/10/2012 1219   NITRITE Negative 08/10/2012 1219   LEUKOCYTESUR Negative 08/10/2012 1219    Pertinent Imaging:   Assessment & Plan:    1. Prostate cancer:   Patient underwent RRP in 1995.  His PSA remained below 1 until 2014 when it rose to 9.8 ng/mL.  He was started on ADT and his PSA returned to <0.1ng/mL.    - PSA  - Leuprolide Acetate (6 Month) (LUPRON) injection 45 mg; Inject 45 mg into the muscle once.  He will RTC in 6 months for PSA and Lupron  2. Incontinence:   Patient has noticed an increase in his urinary symptoms over the last month.  He is not experiencing dysuria and his PVR is minimal.  He could not provide a specimen today.  He does not want to pursue any treatment at this time.  He will return to the clinic if his symptoms worsen.    Return in about 6 months (around 12/21/2015) for [place].  Zara Council, [facility] [address], [place], [place] [address] [phone]

## 2015-06-21 ENCOUNTER — Telehealth: Payer: Self-pay | Admitting: *Deleted

## 2015-06-21 LAB — PSA

## 2015-06-21 NOTE — Telephone Encounter (Signed)
I spoke w/the patient and relayed their lab results as well as their next appointment information.  The pt indicated understanding and had no questions.I spoke w/the patient and relayed their lab results.  The pt indicated understanding and had no questions.

## 2015-06-21 NOTE — Telephone Encounter (Signed)
-----   Message from Nori Riis, PA-C sent at 06/21/2015  8:17 AM EDT ----- Patient's PSA is stable.  We will see him in 6 months.  PSA to be drawn before his next appointment.

## 2015-06-25 ENCOUNTER — Telehealth: Payer: Self-pay

## 2015-06-25 NOTE — Telephone Encounter (Signed)
Not sure what she was thinking or what was discussed.  Please touch base with her next week.  Let the patient know she is out of town and we will follow up next week.   Hollice Espy, MD

## 2015-06-25 NOTE — Telephone Encounter (Signed)
Pt called stating he saw Larene Beach last week and she offered medication for incontinence. Pt stated at the time of visit he did not want any medication, but now would like to try something. I did not see in Shannon's note where she recommended a medication. Please advise.

## 2015-06-25 NOTE — Telephone Encounter (Signed)
Spoke with pt and made aware of Ian Sanders being out of town. Pt voiced understanding.

## 2015-06-30 NOTE — Telephone Encounter (Signed)
If he is experiencing worsening in his incontinence, I would like him to RTC for a PVR, IPSS and UA.  I would like to rule out infection or urine retention as a cause for his incontinence before prescribing any medication.

## 2015-07-01 NOTE — Telephone Encounter (Signed)
Spoke with pt in reference to the need for medication. Nurse made pt aware a f/u appt is needed to r/o infection and urinary retention prior to getting any medications. Pt voiced understanding. Pt was transferred to the front to make f/u appt.

## 2015-07-16 ENCOUNTER — Encounter: Payer: Self-pay | Admitting: Urology

## 2015-07-16 ENCOUNTER — Ambulatory Visit (INDEPENDENT_AMBULATORY_CARE_PROVIDER_SITE_OTHER): Payer: PPO | Admitting: Urology

## 2015-07-16 VITALS — BP 145/74 | HR 67 | Resp 16 | Ht 70.0 in | Wt 199.5 lb

## 2015-07-16 DIAGNOSIS — N3941 Urge incontinence: Secondary | ICD-10-CM | POA: Diagnosis not present

## 2015-07-16 NOTE — Progress Notes (Signed)
07/16/2015 10:27 PM   [name] 1940-12-22 [id number]  Referring provider: Birdie Sons, MD [address] [place], [place] [address]  Chief Complaint  Patient presents with  . Follow-up  . Urinary Incontinence    HPI: Patient is a 74 year old white male with a history of prostate cancer who is experiencing a worsening in his urinary incontinence and presents today for further management and evaluation.  Patient has noted over the last 2 months he said an increase of urinary frequency, nocturia x 2 and incontinence. He does not find it bothersome at this time.  He is planning to visit his son in [place], [place] and would like to get his urinary symptoms under control for the trip.  He is having urinary urgency, weak stream and intermittency as well. His PVR is 44 mL.  He is not experiencing any associated dysuria, suprapubic pain or gross hematuria.  PMH: Past Medical History  Diagnosis Date  . Cramp of limb   . Mononeuritis of unspecified site   . Other seborrheic keratosis   . Insomnia, unspecified   . Allergic rhinitis, cause unspecified   . Esophageal reflux   . Unspecified essential hypertension   . Benign paroxysmal positional vertigo   . Dizziness and giddiness   . Headache(784.0)   . 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 Atrial fibrillation   . Sprain of unspecified site of sacroiliac region   . Unspecified hypothyroidism   . Pure hyperglyceridemia   . Lumbago   . Other malaise and fatigue   . Stroke   . Elevated PSA   . Right inguinal hernia     Surgical History: Past Surgical History  Procedure Laterality Date  . Hernia repair    . Prostatectomy      Home Medications:    Medication List       This list is accurate as of: 07/16/15 11:59 PM.  Always use your most recent med list.               atenolol-chlorthalidone 50-25 MG per tablet  Commonly known as:  TENORETIC    Take 1 tablet by mouth daily.     BIOTIN PO  Take by mouth daily.     calcium-vitamin D 250-125 MG-UNIT per tablet  Commonly known as:  OSCAL  Take by mouth.     EPIPEN 2-PAK 0.3 mg/0.3 mL Soaj injection  Generic drug:  EPINEPHrine  Inject 0.3 mg into the muscle.     flecainide 100 MG tablet  Commonly known as:  TAMBOCOR  Take 1 tablet (100 mg total) by mouth 2 (two) times daily.     MIRALAX PO  Take by mouth.     omeprazole 40 MG capsule  Commonly known as:  PRILOSEC  Take 40 mg by mouth.     rivaroxaban 20 MG Tabs tablet  Commonly known as:  XARELTO  Take by mouth.     traZODone 100 MG tablet  Commonly known as:  DESYREL  Take 1 tablet (100 mg total) by mouth at bedtime.     VITAMIN B-12 PO  Take by mouth daily.     zolpidem 10 MG tablet  Commonly known as:  AMBIEN  Take 1 tablet (10 mg total) by mouth at bedtime as needed.        Allergies:  Allergies  Allergen Reactions  . Lisinopril Swelling    Tongue swelling  . Atorvastatin  Dizziness and Fatigue   . Dabigatran Etexilate Mesylate     Stomach ulcers (Pradaxa)    Family History: Family History  Problem Relation Age of Onset  . Heart disease Mother 21    CABG  . Hypertension Brother   . Heart disease Sister     stents  . Prostate cancer Brother   . Skin cancer Brother   . Kidney cancer Brother   . Bladder Cancer Sister   . Leukemia Father   . Hypertension Mother   . Cerebral aneurysm Sister     Social History:  reports that he quit smoking about 43 years ago. He quit smokeless tobacco use about 6 months ago. His smokeless tobacco use included Chew. He reports that he drinks about 7.0 oz of alcohol per week. He reports that he does not use illicit drugs.  ROS: UROLOGY Frequent Urination?: No Hard to postpone urination?: Yes Burning/pain with urination?: No Get up at night to urinate?: Yes Leakage of urine?: Yes Urine stream starts and stops?: Yes Trouble starting stream?: No Do  you have to strain to urinate?: No Blood in urine?: No Urinary tract infection?: No Sexually transmitted disease?: No Injury to kidneys or bladder?: No Painful intercourse?: No Weak stream?: Yes Erection problems?: No Penile pain?: No  Gastrointestinal Nausea?: No Vomiting?: No Indigestion/heartburn?: No Diarrhea?: No Constipation?: Yes  Constitutional Fever: No Night sweats?: No Weight loss?: No Fatigue?: No  Skin Skin rash/lesions?: No Itching?: No  Eyes Blurred vision?: No Double vision?: No  Ears/Nose/Throat Sore throat?: No Sinus problems?: No  Hematologic/Lymphatic Swollen glands?: No Easy bruising?: Yes  Cardiovascular Leg swelling?: No Chest pain?: No  Respiratory Cough?: No Shortness of breath?: No  Endocrine Excessive thirst?: No  Musculoskeletal Back pain?: No Joint pain?: No  Neurological Headaches?: No Dizziness?: No  Psychologic Depression?: No Anxiety?: No  Physical Exam: Blood pressure 145/74, pulse 67, resp. rate 16, height 5\' 10"  (1.778 m), weight 199 lb 8 oz (90.493 kg).   Laboratory Data: Lab Results  Component Value Date   WBC 9.2 08/18/2012   HGB 13.9 08/18/2012   HCT 40.1 08/18/2012   MCV 83 08/18/2012   PLT 105* 08/18/2012    Lab Results  Component Value Date   CREATININE 1.15 04/29/2015    Lab Results  Component Value Date   PSA <0.1 06/20/2015     Lab Results  Component Value Date   HGBA1C 5.8 04/23/2015    Pertinent Imaging: Results for orders placed or performed in visit on 06/20/15  PSA  Result Value Ref Range   Prostate Specific Ag, Serum <0.1 0.0 - 4.0 ng/mL  Bladder Scan (Post Void Residual) in office  Result Value Ref Range   Scan Result 44     Assessment & Plan:    1. Urge incontinence:   due to patient's advanced age I would like to avoid anticholinergics for his urinary incontinence. I have given him samples of Myrbetriq 25 mg to take 1 daily (#28).  I have advised the patient of  the side effects of Myrbetriq, such as: elevation in BP, urinary retention and/or HA.  He return in 3 weeks for symptom recheck and PVR.    Return in about 3 weeks (around 08/06/2015) for PVR.  Zara Council, [facility] [address] [place], [place] [address] [phone]

## 2015-07-17 DIAGNOSIS — N3941 Urge incontinence: Secondary | ICD-10-CM | POA: Insufficient documentation

## 2015-07-18 ENCOUNTER — Other Ambulatory Visit: Payer: Self-pay | Admitting: *Deleted

## 2015-07-19 MED ORDER — ZOLPIDEM TARTRATE 10 MG PO TABS: 10.0000 mg | ORAL_TABLET | Freq: Every evening | ORAL | Status: DC | PRN

## 2015-07-19 NOTE — Telephone Encounter (Signed)
Please call in Ambien

## 2015-07-19 NOTE — Telephone Encounter (Signed)
Rx called in to pharmacy. 

## 2015-08-06 ENCOUNTER — Ambulatory Visit: Payer: PPO | Admitting: Urology

## 2015-08-08 ENCOUNTER — Ambulatory Visit (INDEPENDENT_AMBULATORY_CARE_PROVIDER_SITE_OTHER): Payer: PPO | Admitting: Urology

## 2015-08-08 ENCOUNTER — Encounter: Payer: Self-pay | Admitting: Urology

## 2015-08-08 VITALS — BP 133/71 | HR 61 | Ht 70.0 in | Wt 199.1 lb

## 2015-08-08 DIAGNOSIS — C61 Malignant neoplasm of prostate: Secondary | ICD-10-CM

## 2015-08-08 DIAGNOSIS — N3941 Urge incontinence: Secondary | ICD-10-CM

## 2015-08-08 LAB — BLADDER SCAN AMB NON-IMAGING: SCAN RESULT: 35

## 2015-08-08 NOTE — Progress Notes (Signed)
2:46 PM   Ian Sanders Jun 15, 1941 [id number]  Referring provider: Birdie Sons, MD [address] [place], [place] [address]  Chief Complaint  Patient presents with  . urge incontinence    3 week check up    HPI: Patient is a 74 year old white male with a history of prostate cancer who was given a trial of Myrbetriq 25 mg 1 daily for his urinary incontinence, frequency and nocturia 2.  He noticed an improvement in his urinary symptoms after a few days of being on the medication. He states he is not quite 100% but it relieves his symptoms enough.  He is not planning to visit his son in [place], [place] at this time as the son is coming to [place] for Thanksgiving.  His PVR today is 35 mL.   He is not experiencing any associated dysuria, suprapubic pain or gross hematuria.  PMH: Past Medical History  Diagnosis Date  . Cramp of limb   . Mononeuritis of unspecified site   . Other seborrheic keratosis   . Insomnia, unspecified   . Allergic rhinitis, cause unspecified   . Esophageal reflux   . Unspecified essential hypertension   . Benign paroxysmal positional vertigo   . Dizziness and giddiness   . Headache(784.0)   . 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 Atrial fibrillation   . Sprain of unspecified site of sacroiliac region   . Unspecified hypothyroidism   . Pure hyperglyceridemia   . Lumbago   . Other malaise and fatigue   . Stroke   . Elevated PSA   . Right inguinal hernia     Surgical History: Past Surgical History  Procedure Laterality Date  . Hernia repair    . Prostatectomy    . Finger amputation    . Thumb surgery    . Skin surgery      multiple    Home Medications:    Medication List       This list is accurate as of: 08/08/15  2:46 PM.  Always use your most recent med list.               atenolol-chlorthalidone 50-25 MG per tablet  Commonly known as:   TENORETIC  Take 1 tablet by mouth daily.     BIOTIN PO  Take by mouth daily.     calcium-vitamin D 250-125 MG-UNIT per tablet  Commonly known as:  OSCAL  Take by mouth.     EPIPEN 2-PAK 0.3 mg/0.3 mL Soaj injection  Generic drug:  EPINEPHrine  Inject 0.3 mg into the muscle.     flecainide 100 MG tablet  Commonly known as:  TAMBOCOR  Take 1 tablet (100 mg total) by mouth 2 (two) times daily.     MIRALAX PO  Take by mouth.     MYRBETRIQ 25 MG Tb24 tablet  Generic drug:  mirabegron ER  Take 25 mg by mouth daily.     omeprazole 40 MG capsule  Commonly known as:  PRILOSEC  Take 40 mg by mouth.     rivaroxaban 20 MG Tabs tablet  Commonly known as:  XARELTO  Take by mouth.     traZODone 100 MG tablet  Commonly known as:  DESYREL  Take 1 tablet (100 mg total) by mouth at bedtime.     VITAMIN B-12 PO  Take by mouth daily.     zolpidem 10 MG tablet  Commonly  known as:  AMBIEN  Take 1 tablet (10 mg total) by mouth at bedtime as needed.        Allergies:  Allergies  Allergen Reactions  . Lisinopril Swelling    Tongue swelling  . Atorvastatin     Dizziness and Fatigue   . Dabigatran Etexilate Mesylate     Stomach ulcers (Pradaxa)    Family History: Family History  Problem Relation Age of Onset  . Heart disease Mother 25    CABG  . Hypertension Brother   . Heart disease Sister     stents  . Prostate cancer Brother   . Skin cancer Brother   . Kidney cancer Brother   . Bladder Cancer Sister   . Leukemia Father   . Hypertension Mother   . Cerebral aneurysm Sister     Social History:  reports that he quit smoking about 43 years ago. He quit smokeless tobacco use about 7 months ago. His smokeless tobacco use included Chew. He reports that he drinks about 7.0 oz of alcohol per week. He reports that he does not use illicit drugs.  ROS: UROLOGY Frequent Urination?: No Hard to postpone urination?: Yes Burning/pain with urination?: No Get up at night to  urinate?: Yes Leakage of urine?: Yes Urine stream starts and stops?: Yes Trouble starting stream?: No Do you have to strain to urinate?: No Blood in urine?: No Urinary tract infection?: No Sexually transmitted disease?: No Injury to kidneys or bladder?: No Painful intercourse?: No Weak stream?: Yes Erection problems?: No Penile pain?: No  Gastrointestinal Nausea?: No Vomiting?: No Indigestion/heartburn?: No Diarrhea?: No Constipation?: Yes  Constitutional Fever: No Night sweats?: No Weight loss?: No Fatigue?: No  Skin Skin rash/lesions?: No Itching?: No  Eyes Blurred vision?: No Double vision?: No  Ears/Nose/Throat Sore throat?: No Sinus problems?: No  Hematologic/Lymphatic Swollen glands?: No Easy bruising?: Yes  Cardiovascular Leg swelling?: No Chest pain?: No  Respiratory Cough?: No Shortness of breath?: No  Endocrine Excessive thirst?: No  Musculoskeletal Back pain?: No Joint pain?: No  Neurological Headaches?: No Dizziness?: No  Psychologic Depression?: No Anxiety?: No  Physical Exam: Blood pressure 133/71, pulse 61, height 5\' 10"  (1.778 m), weight 199 lb 1.6 oz (90.311 kg).   Laboratory Data: Lab Results  Component Value Date   WBC 9.2 08/18/2012   HGB 13.9 08/18/2012   HCT 40.1 08/18/2012   MCV 83 08/18/2012   PLT 105* 08/18/2012    Lab Results  Component Value Date   CREATININE 1.15 04/29/2015    Lab Results  Component Value Date   PSA <0.1 06/20/2015     Lab Results  Component Value Date   HGBA1C 5.8 04/23/2015    Pertinent Imaging: Results for Ian Sanders (MRN [id number]) as of 08/08/2015 14:42  Ref. Range 08/08/2015 14:32  Scan Result Unknown 35   Assessment & Plan:    1. Urge incontinence:   Patient had a good response with her Myrbetriq 25 mg. He does not want to be on the medication long-term. I have given him samples of Myrbetriq 25 mg to take 1 daily (#28).  He will use the samples if he engages on  a long trip.   He return in February 2017 for PVR and symptom recheck.  2. Prostate cancer: Patient underwent RRP in 1995. His PSA remained below 1 until 2014 when it rose to 9.8 ng/mL. He was started on ADT and his PSA returned to <0.1ng/mL.   He will RTC in February 2017  for PSA and Lupron  Return for in February 2017 for Lupron and PSA.  Ian Sanders, [facility] [address], [place], [place] [address] [phone]

## 2015-08-09 ENCOUNTER — Ambulatory Visit (INDEPENDENT_AMBULATORY_CARE_PROVIDER_SITE_OTHER): Payer: PPO | Admitting: Family Medicine

## 2015-08-09 ENCOUNTER — Encounter: Payer: Self-pay | Admitting: Family Medicine

## 2015-08-09 VITALS — BP 116/60 | HR 61 | Temp 98.8°F | Resp 16 | Wt 201.0 lb

## 2015-08-09 DIAGNOSIS — Z23 Encounter for immunization: Secondary | ICD-10-CM

## 2015-08-09 DIAGNOSIS — M25511 Pain in right shoulder: Secondary | ICD-10-CM | POA: Diagnosis not present

## 2015-08-09 NOTE — Progress Notes (Signed)
Patient: Ian Sanders Male    DOB: 1940/12/17   74 y.o.   MRN: [id number] Visit Date: 08/09/2015  Today's Provider: Lelon Huh, MD   Chief Complaint  Patient presents with  . Shoulder Pain    Right  . Fall   Subjective:    Shoulder Pain  The pain is present in the right shoulder, neck and right hand. This is a new problem. The current episode started in the past 7 days. There has been a history of trauma. The problem occurs constantly. The problem has been unchanged. The quality of the pain is described as dull. The pain is at a severity of 3/10. The pain is mild. Associated symptoms include a limited range of motion, numbness, stiffness and tingling. Pertinent negatives include no fever, joint locking or joint swelling. The symptoms are aggravated by lying down and activity. Treatments tried: pain patch. The treatment provided mild relief.   He fell and struck the back of his right shoulder on fireplace 3 days ago and has been sore every since. It is slowly improving and not affecting use of his hand or arm.    Allergies  Allergen Reactions  . Lisinopril Swelling    Tongue swelling  . Atorvastatin     Dizziness and Fatigue   . Dabigatran Etexilate Mesylate     Stomach ulcers (Pradaxa)   Previous Medications   ATENOLOL-CHLORTHALIDONE (TENORETIC) 50-25 MG PER TABLET    Take 1 tablet by mouth daily.   BIOTIN PO    Take by mouth daily.   CALCIUM-VITAMIN D (OSCAL) 250-125 MG-UNIT PER TABLET    Take by mouth.   CYANOCOBALAMIN (VITAMIN B-12 PO)    Take by mouth daily.   EPINEPHRINE (EPIPEN 2-PAK) 0.3 MG/0.3 ML IJ SOAJ INJECTION    Inject 0.3 mg into the muscle.   FLECAINIDE (TAMBOCOR) 100 MG TABLET    Take 1 tablet (100 mg total) by mouth 2 (two) times daily.   LEUPROLIDE ACETATE, 6 MONTH, (LUPRON) 45 MG INJECTION    Inject 45 mg into the muscle every 6 (six) months.   MIRABEGRON ER (MYRBETRIQ) 25 MG TB24 TABLET    Take 25 mg by mouth daily.   OMEPRAZOLE (PRILOSEC) 40  MG CAPSULE    Take 40 mg by mouth.   POLYETHYLENE GLYCOL 3350 (MIRALAX PO)    Take by mouth.   RIVAROXABAN (XARELTO) 20 MG TABS TABLET    Take by mouth.   TRAZODONE (DESYREL) 100 MG TABLET    Take 1 tablet (100 mg total) by mouth at bedtime.   ZOLPIDEM (AMBIEN) 10 MG TABLET    Take 1 tablet (10 mg total) by mouth at bedtime as needed.    Review of Systems  Constitutional: Negative for fever.  Cardiovascular: Negative for chest pain and palpitations.  Musculoskeletal: Positive for myalgias, stiffness, neck pain and neck stiffness.  Neurological: Positive for dizziness, tingling, light-headedness, numbness and headaches.    Social History  Substance Use Topics  . Smoking status: Former Smoker -- 1.00 packs/day for 10 years    Quit date: 10/28/1971  . Smokeless tobacco: Former Systems developer    Types: Chew    Quit date: 01/02/2015  . Alcohol Use: 7.0 oz/week    14 Standard drinks or equivalent per week     Comment: moderate   Objective:   BP 116/60 mmHg  Pulse 61  Temp(Src) 98.8 F (37.1 C) (Oral)  Resp 16  Wt 201 lb (91.173 kg)  SpO2 96%  Physical Exam  MS: No tenderness of shoulder or upper arm. Full passive and active range of motion both shoulders, but pain illicited with flexion of right shoulder > 90 degrees. No swelling or erythema. Normal s/s both upper extremities.     Assessment & Plan:     1. Right shoulder pain Likely strain from fall. Is not limiting activity and is steadily improving. Counseled it should continue to slowly improve and resolve within 2 weeks. Call otherwise.   2. Need for influenza vaccination  - Flu vaccine HIGH DOSE PF       Lelon Huh, MD  [facility]

## 2015-08-12 ENCOUNTER — Ambulatory Visit: Payer: PPO | Admitting: Urology

## 2015-08-20 ENCOUNTER — Encounter: Payer: Self-pay | Admitting: Cardiovascular Disease

## 2015-08-20 ENCOUNTER — Telehealth: Payer: Self-pay | Admitting: Urology

## 2015-08-20 ENCOUNTER — Ambulatory Visit (INDEPENDENT_AMBULATORY_CARE_PROVIDER_SITE_OTHER): Payer: PPO | Admitting: Cardiovascular Disease

## 2015-08-20 VITALS — BP 126/70 | HR 57 | Ht 70.0 in | Wt 202.0 lb

## 2015-08-20 DIAGNOSIS — I10 Essential (primary) hypertension: Secondary | ICD-10-CM | POA: Diagnosis not present

## 2015-08-20 DIAGNOSIS — N3941 Urge incontinence: Secondary | ICD-10-CM

## 2015-08-20 DIAGNOSIS — I480 Paroxysmal atrial fibrillation: Secondary | ICD-10-CM | POA: Diagnosis not present

## 2015-08-20 MED ORDER — FLECAINIDE ACETATE 100 MG PO TABS: 100.0000 mg | ORAL_TABLET | Freq: Two times a day (BID) | ORAL | Status: DC

## 2015-08-20 MED ORDER — RIVAROXABAN 20 MG PO TABS: 20.0000 mg | ORAL_TABLET | Freq: Every day | ORAL | Status: DC

## 2015-08-20 MED ORDER — MIRABEGRON ER 25 MG PO TB24: 25.0000 mg | ORAL_TABLET | Freq: Every day | ORAL | Status: DC

## 2015-08-20 NOTE — Progress Notes (Signed)
Patient ID: Ian Sanders, male    DOB: August 04, 1941, 74 y.o.   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 Caryn Section. He presents for evaluation of his atrial fibrillation. History of obstructive sleep apnea, does not use CPAP  In follow-up, he reports that he has been doing well on flecainide twice a day He reports having one episode of atrial fibrillation that lasted only a short period of time. Resolved without intervention Activity baseline, no regular exercise regiment He has not been taking aspirin but has been taking Xarelto daily. Reports blood pressure has been well controlled. No problems with anticoagulation Has not required a statin in the past, total cholesterol 150 Typically eats what he likes with no restrictions  EKG from today's visit shows normal sinus rhythm with rate 57 bpm, no significant ST or T-wave changes  Other past medical history Echocardiogram May 2011 showed mildly dilated left atrium, no significant valvular disease, normal ejection fraction. Carotid ultrasound shows small calcified plaque bilaterally with no significant stenosis, May 2011    Allergies  Allergen Reactions  . Lisinopril Swelling    Tongue swelling  . Atorvastatin     Dizziness and Fatigue   . Dabigatran Etexilate Mesylate     Stomach ulcers (Pradaxa)    Current Outpatient Prescriptions on File Prior to Visit  Medication Sig Dispense Refill  . atenolol-chlorthalidone (TENORETIC) 50-25 MG per tablet Take 1 tablet by mouth daily. 90 tablet 3  . BIOTIN PO Take by mouth daily.    . calcium-vitamin D (OSCAL) 250-125  MG-UNIT per tablet Take by mouth.    . Cyanocobalamin (VITAMIN B-12 PO) Take by mouth daily.    Marland Kitchen EPINEPHrine (EPIPEN 2-PAK) 0.3 mg/0.3 mL IJ SOAJ injection Inject 0.3 mg into the muscle.    Marland Kitchen Leuprolide Acetate, 6 Month, (LUPRON) 45 MG injection Inject 45 mg into the muscle every 6 (six) months.    . mirabegron ER (MYRBETRIQ) 25 MG TB24 tablet Take 25 mg by mouth daily.    Marland Kitchen omeprazole (PRILOSEC) 40 MG capsule Take 40 mg by mouth.    . Polyethylene Glycol 3350 (MIRALAX PO) Take by mouth.    . zolpidem (AMBIEN) 10 MG tablet Take 1 tablet (10 mg total) by mouth at bedtime as needed. 30 tablet 2   No current facility-administered medications on file prior to visit.    Past Medical History  Diagnosis Date  . Cramp of limb   . Mononeuritis of unspecified site   . Other seborrheic keratosis   . Insomnia, unspecified   . Allergic rhinitis, cause unspecified   . Esophageal reflux   . Unspecified essential hypertension   . Benign paroxysmal positional vertigo   . Dizziness and giddiness   . Headache(784.0)   . Transient ischemic attack (TIA), and cerebral infarction without residual deficits   . Atrial fibrillation ([place])   . Sprain of unspecified site of sacroiliac region   . Unspecified hypothyroidism   . Pure hyperglyceridemia   . Lumbago   . Other malaise and fatigue   . Stroke ([place])   . Elevated PSA   . Right inguinal hernia  Past Surgical History  Procedure Laterality Date  . Hernia repair    . Prostatectomy    . Finger amputation    . Thumb surgery    . Skin surgery      multiple    Social History  reports that he quit smoking about 43 years ago. He quit smokeless tobacco use about 7 months ago. His smokeless tobacco use included Chew. He reports that he drinks about 7.0 oz of alcohol per week. He reports that he does not use illicit drugs.  Family History family history includes Bladder Cancer in his sister; Cerebral aneurysm in his sister; Heart disease in his  sister; Heart disease (age of onset: 57) in his mother; Hypertension in his brother and mother; Kidney cancer in his brother; Leukemia in his father; Prostate cancer in his brother; Skin cancer in his brother.   Review of Systems  Constitutional: Negative.   Respiratory: Negative.   Cardiovascular: Negative.   Gastrointestinal: Negative.   Musculoskeletal: Negative.   Neurological: Negative.   Hematological: Negative.   Psychiatric/Behavioral: Negative.   All other systems reviewed and are negative.  BP 126/70 mmHg  Pulse 57  Ht 5\' 10"  (1.778 m)  Wt 202 lb (91.627 kg)  BMI 28.98 kg/m2  Physical Exam  Constitutional: He is oriented to person, place, and time. He appears well-developed and well-nourished.  HENT:  Head: Normocephalic.  Nose: Nose normal.  Mouth/Throat: Oropharynx is clear and moist.  Eyes: Conjunctivae are normal. Pupils are equal, round, and reactive to light.  Neck: Normal range of motion. Neck supple. No JVD present.  Cardiovascular: Normal rate, S1 normal, S2 normal, normal heart sounds and intact distal pulses.  An irregularly irregular rhythm present. Exam reveals no gallop and no friction rub.   No murmur heard. Pulmonary/Chest: Effort normal and breath sounds normal. No respiratory distress. He has no wheezes. He has no rales. He exhibits no tenderness.  Abdominal: Soft. Bowel sounds are normal. He exhibits no distension. There is no tenderness.  Musculoskeletal: Normal range of motion. He exhibits no edema or tenderness.  Lymphadenopathy:    He has no cervical adenopathy.  Neurological: He is alert and oriented to person, place, and time. Coordination normal.  Skin: Skin is warm and dry. No rash noted. No erythema.  Psychiatric: He has a normal mood and affect. His behavior is normal. Judgment and thought content normal.      Assessment and Plan   Nursing note and vitals reviewed.

## 2015-08-20 NOTE — Telephone Encounter (Signed)
Script sent to pharm, left pt mess to call/SW

## 2015-08-20 NOTE — Assessment & Plan Note (Signed)
Rare episodes of atrial fibrillation, for the most part maintaining normal sinus rhythm. No changes to his medications. Will stay on anticoagulation

## 2015-08-20 NOTE — Assessment & Plan Note (Signed)
Blood pressure is well controlled on today's visit. No changes made to the medications. 

## 2015-08-20 NOTE — Patient Instructions (Signed)
You are doing well. No medication changes were made.  Hold aspirin Stay on xarelto  Please call us if you have new issues that need to be addressed before your next appt.  Your physician wants you to follow-up in: 12 months.  You will receive a reminder letter in the mail two months in advance. If you don't receive a letter, please call our office to schedule the follow-up appointment.

## 2015-08-20 NOTE — Telephone Encounter (Signed)
Pt called, has been taking samples of Myrbetriq that [facility] gave him.  Didn't want a Rx but has now changed his mind and would like a Rx called in to [facility] ([phone].

## 2015-08-21 NOTE — Telephone Encounter (Signed)
Spoke with patient and let him know that you called this in   Thanks, Rainelle

## 2015-08-27 NOTE — Telephone Encounter (Signed)
Spoke with pt and offered to do a PA. Pt agreed and will have pharmacy fax information.

## 2015-08-27 NOTE — Telephone Encounter (Signed)
Pt states insurance has denied claim for Myrbetriq. Please return his call at [phone].

## 2015-09-10 ENCOUNTER — Telehealth: Payer: Self-pay

## 2015-09-10 NOTE — Telephone Encounter (Signed)
LMOM- pt was approved for myrbetriq 25mg  09/09/15-12/31-16.

## 2015-10-28 ENCOUNTER — Other Ambulatory Visit: Payer: Self-pay | Admitting: *Deleted

## 2015-10-28 MED ORDER — ZOLPIDEM TARTRATE 10 MG PO TABS: 10.0000 mg | ORAL_TABLET | Freq: Every evening | ORAL | Status: DC | PRN

## 2015-10-28 NOTE — Telephone Encounter (Signed)
Rx called in to pharmacy. 

## 2015-10-28 NOTE — Telephone Encounter (Signed)
Please call in zolpidem  

## 2015-10-28 NOTE — Telephone Encounter (Signed)
Patient is requesting more than a 30 day quantity?

## 2015-12-03 DIAGNOSIS — D1801 Hemangioma of skin and subcutaneous tissue: Secondary | ICD-10-CM | POA: Diagnosis not present

## 2015-12-03 DIAGNOSIS — L570 Actinic keratosis: Secondary | ICD-10-CM | POA: Diagnosis not present

## 2015-12-03 DIAGNOSIS — Z1283 Encounter for screening for malignant neoplasm of skin: Secondary | ICD-10-CM | POA: Diagnosis not present

## 2015-12-03 DIAGNOSIS — D2339 Other benign neoplasm of skin of other parts of face: Secondary | ICD-10-CM | POA: Diagnosis not present

## 2015-12-23 ENCOUNTER — Other Ambulatory Visit: Payer: PPO

## 2015-12-23 DIAGNOSIS — Z8546 Personal history of malignant neoplasm of prostate: Secondary | ICD-10-CM

## 2015-12-24 LAB — PSA

## 2015-12-26 ENCOUNTER — Encounter: Payer: Self-pay | Admitting: Urology

## 2015-12-26 ENCOUNTER — Ambulatory Visit (INDEPENDENT_AMBULATORY_CARE_PROVIDER_SITE_OTHER): Payer: PPO | Admitting: Urology

## 2015-12-26 VITALS — BP 144/76 | HR 64 | Ht 70.0 in | Wt 205.7 lb

## 2015-12-26 DIAGNOSIS — C61 Malignant neoplasm of prostate: Secondary | ICD-10-CM

## 2015-12-26 DIAGNOSIS — R32 Unspecified urinary incontinence: Secondary | ICD-10-CM

## 2015-12-26 LAB — BLADDER SCAN AMB NON-IMAGING: SCAN RESULT: 0

## 2015-12-26 MED ORDER — LEUPROLIDE ACETATE (6 MONTH) 45 MG IM KIT
45.0000 mg | PACK | Freq: Once | INTRAMUSCULAR | Status: AC
  Administered 2015-12-26: 45 mg via INTRAMUSCULAR

## 2015-12-26 NOTE — Progress Notes (Signed)
12/26/2015 2:16 PM   Ian Sanders 04-07-1941 [id number]  Referring provider: Birdie Sons, MD [address] [place], [place] [address]  Chief Complaint  Patient presents with  . Prostate Cancer    6 month follow up and lupron    HPI: Ian Sanders is a 75 year old Caucasian with prostate cancer and incontinence who presents today for his 6 months Lupron.  Prostate cancer: Patient underwent RRP by Dr. Domingo Cocking at [facility] in 1995. His PSA remained below 1 until 2014 when it rose to 9.8 ng/mL. He was started on ADT with [place] in 03/15. He has received [place] for 4 months and then was switched to a longer acting agent, Lupron. He is having hot flashes, but he finds them manageable. He is taking 600mg  of calcium twice daily and vitamin d 400mg  twice daily. He reports a good appetite, no weight loss and no back pain. His last PSA was <0.1 ng/mL on 12/23/2015. He is experiencing hot flashes, but he does not want to stop the Lupron at this time.    Incontinence: Patient has had an increase of urinary frequency, nocturia and leakage.  He has not had dysuria, fevers, chills, nausea or vomiting. He also denies suprapubic pain. He was seeing some improvement with Myrbetriq, but he experienced tongue swelling and discontinued the medication.  His IPSS score today is 7/3.  His PVR is 0 mL.  We did briefly discuss the male sling, but he states the incontinence is not that bad.        IPSS      12/26/15 1300       International Prostate Symptom Score   How often have you had the sensation of not emptying your bladder? Less than 1 in 5     How often have you had to urinate less than every two hours? Less than half the time     How often have you found you stopped and started again several times when you urinated? Less than 1 in 5 times     How often have you found it difficult to postpone urination? Less than 1 in 5 times     How often have you had a weak urinary stream? Not at  All     How often have you had to strain to start urination? Not at All     How many times did you typically get up at night to urinate? 2 Times     Total IPSS Score 7     Quality of Life due to urinary symptoms   If you were to spend the rest of your life with your urinary condition just the way it is now how would you feel about that? Mixed        Score:  1-7 Mild 8-19 Moderate 20-35 Severe    PMH: Past Medical History  Diagnosis Date  . Cramp of limb   . Mononeuritis of unspecified site   . Other seborrheic keratosis   . Insomnia, unspecified   . Allergic rhinitis, cause unspecified   . Esophageal reflux   . Unspecified essential hypertension   . Benign paroxysmal positional vertigo   . Dizziness and giddiness   . Headache(784.0)   . Transient ischemic attack (TIA), and cerebral infarction without residual deficits   . Atrial fibrillation ([place])   . Sprain of unspecified site of sacroiliac region   . Unspecified hypothyroidism   . Pure hyperglyceridemia   . Lumbago   .  Other malaise and fatigue   . Stroke ([place])   . Elevated PSA   . Right inguinal hernia     Surgical History: Past Surgical History  Procedure Laterality Date  . Hernia repair    . Prostatectomy    . Finger amputation    . Thumb surgery    . Skin surgery      multiple    Home Medications:    Medication List       This list is accurate as of: 12/26/15  2:16 PM.  Always use your most recent med list.               atenolol-chlorthalidone 50-25 MG tablet  Commonly known as:  TENORETIC  Take 1 tablet by mouth daily.     BIOTIN PO  Take by mouth daily.     calcium-vitamin D 250-125 MG-UNIT tablet  Commonly known as:  OSCAL  Take by mouth.     EPIPEN 2-PAK 0.3 mg/0.3 mL Soaj injection  Generic drug:  EPINEPHrine  Inject 0.3 mg into the muscle.     flecainide 100 MG tablet  Commonly known as:  TAMBOCOR  Take 1 tablet (100 mg total) by mouth 2 (two) times daily.     Leuprolide  Acetate (6 Month) 45 MG injection  Commonly known as:  LUPRON  Inject 45 mg into the muscle every 6 (six) months.     MIRALAX PO  Take by mouth.     omeprazole 40 MG capsule  Commonly known as:  PRILOSEC  Take 40 mg by mouth.     rivaroxaban 20 MG Tabs tablet  Commonly known as:  XARELTO  Take 1 tablet (20 mg total) by mouth daily.     VITAMIN B-12 PO  Take by mouth daily.     zolpidem 10 MG tablet  Commonly known as:  AMBIEN  Take 1 tablet (10 mg total) by mouth at bedtime as needed.        Allergies:  Allergies  Allergen Reactions  . Lisinopril Swelling    Tongue swelling  . Myrbetriq [Mirabegron] Swelling    Of the tongue  . Atorvastatin     Dizziness and Fatigue   . Dabigatran Etexilate Mesylate     Stomach ulcers (Pradaxa)    Family History: Family History  Problem Relation Age of Onset  . Heart disease Mother 31    CABG  . Hypertension Brother   . Heart disease Sister     stents  . Prostate cancer Brother   . Skin cancer Brother   . Kidney cancer Brother   . Bladder Cancer Sister   . Leukemia Father   . Hypertension Mother   . Cerebral aneurysm Sister     Social History:  reports that he quit smoking about 44 years ago. He quit smokeless tobacco use about a year ago. His smokeless tobacco use included Chew. He reports that he drinks about 7.0 oz of alcohol per week. He reports that he does not use illicit drugs.  ROS: UROLOGY Frequent Urination?: Yes Hard to postpone urination?: Yes Burning/pain with urination?: No Get up at night to urinate?: Yes Leakage of urine?: Yes Urine stream starts and stops?: No Trouble starting stream?: No Do you have to strain to urinate?: No Blood in urine?: No Urinary tract infection?: No Sexually transmitted disease?: No Injury to kidneys or bladder?: No Painful intercourse?: No Weak stream?: No Erection problems?: No Penile pain?: No  Gastrointestinal Nausea?: No Vomiting?: No Indigestion/heartburn?:  No Diarrhea?: No Constipation?: Yes  Constitutional Fever: No Night sweats?: Yes Weight loss?: No Fatigue?: Yes  Skin Skin rash/lesions?: No Itching?: No  Eyes Blurred vision?: No Double vision?: No  Ears/Nose/Throat Sore throat?: No Sinus problems?: No  Hematologic/Lymphatic Swollen glands?: No Easy bruising?: No  Cardiovascular Leg swelling?: No Chest pain?: No  Respiratory Cough?: No Shortness of breath?: No  Endocrine Excessive thirst?: No  Musculoskeletal Back pain?: No Joint pain?: No  Neurological Headaches?: No Dizziness?: No  Psychologic Depression?: No Anxiety?: No  Physical Exam: BP 144/76 mmHg  Pulse 64  Ht 5\' 10"  (1.778 m)  Wt 205 lb 11.2 oz (93.305 kg)  BMI 29.51 kg/m2  Constitutional: Well nourished. Alert and oriented, No acute distress. HEENT: [place] AT, moist mucus membranes. Trachea midline, no masses. Cardiovascular: No clubbing, cyanosis, or edema. Respiratory: Normal respiratory effort, no increased work of breathing. GI: Abdomen is soft, non tender, non distended, no abdominal masses. Liver and spleen not palpable.  No hernias appreciated.  Stool sample for occult testing is not indicated.   GU: No CVA tenderness.  No bladder fullness or masses.  Patient with uncircumcised phallus. Foreskin easily retracted Urethral meatus is patent.  No penile discharge. No penile lesions or rashes. Scrotum without lesions, cysts, rashes and/or edema.  Testicles are located scrotally bilaterally. No masses are appreciated in the testicles. Left and right epididymis are normal. Rectal: Patient with  normal sphincter tone. Anus and perineum without scarring or rashes. No rectal masses are appreciated. Prostate and seminal vesicles are surgically absent.  No rectal masses.    Skin: No rashes, bruises or suspicious lesions. Lymph: No cervical or inguinal adenopathy. Neurologic: Grossly intact, no focal deficits, moving all 4 extremities. Psychiatric:  Normal mood and affect.  Laboratory Data: Lab Results  Component Value Date   WBC 9.2 08/18/2012   HGB 13.9 08/18/2012   HCT 40.1 08/18/2012   MCV 83 08/18/2012   PLT 105* 08/18/2012    Lab Results  Component Value Date   CREATININE 1.15 04/29/2015   Lab Results  Component Value Date   HGBA1C 5.8 04/23/2015      Component Value Date/Time   CHOL 150 04/29/2015 0804   HDL 29* 04/29/2015 0804   CHOLHDL 5.2* 04/29/2015 0804   LDLCALC 74 04/29/2015 0804    Lab Results  Component Value Date   AST 37 08/18/2012   Lab Results  Component Value Date   ALT 37 08/18/2012    Pertinent Imaging: Results for DERELLE, SEELMAN (MRN [id number]) as of 12/26/2015 13:40  Ref. Range 12/26/2015 13:31  Scan Result Unknown 0    Assessment & Plan:    1. Prostate cancer:   Patient underwent RRP in 1995. His PSA remained below 1 until 2014 when it rose to 9.8 ng/mL. He was started on ADT and his PSA returned to <0.1ng/mL.  He is experiencing hot flashes, but he does not want to discontinue therapy at this time.    - BLADDER SCAN AMB NON-IMAGING - Leuprolide Acetate (6 Month) (LUPRON) injection 45 mg; Inject 45 mg into the muscle once.  2. Incontinence: Patient experienced tongue swelling while on the Myrbetriq, so he discontinued the medication.  He will manage the incontinence with depends at this time. We did briefly discuss a male sling procedure, but he like to defer that until later date.  Return in about 6 months (around 06/24/2016) for Lupron shot and exam.  These notes generated with voice recognition software. I apologize for typographical  errors.  Zara Council, [facility] [address] [place], [place] [address] [phone]

## 2015-12-26 NOTE — Progress Notes (Signed)
Lupron IM Injection   Due to Prostate Cancer patient is present today for a Lupron Injection.  Medication: Lupron 6 month Dose: 45mg   Location: left upper outer buttocks Lot: [id number] Exp: [id number]  Patient tolerated well, no complications were noted  Performed by: Lyndee Hensen CMA  Follow up: 6 months

## 2016-04-16 DIAGNOSIS — G629 Polyneuropathy, unspecified: Secondary | ICD-10-CM | POA: Insufficient documentation

## 2016-04-16 DIAGNOSIS — Z720 Tobacco use: Secondary | ICD-10-CM | POA: Insufficient documentation

## 2016-04-16 DIAGNOSIS — K921 Melena: Secondary | ICD-10-CM | POA: Insufficient documentation

## 2016-04-16 DIAGNOSIS — R7989 Other specified abnormal findings of blood chemistry: Secondary | ICD-10-CM | POA: Insufficient documentation

## 2016-04-16 DIAGNOSIS — R945 Abnormal results of liver function studies: Secondary | ICD-10-CM

## 2016-04-16 DIAGNOSIS — K4090 Unilateral inguinal hernia, without obstruction or gangrene, not specified as recurrent: Secondary | ICD-10-CM | POA: Insufficient documentation

## 2016-04-17 ENCOUNTER — Encounter: Payer: Self-pay | Admitting: Family Medicine

## 2016-04-17 ENCOUNTER — Ambulatory Visit (INDEPENDENT_AMBULATORY_CARE_PROVIDER_SITE_OTHER): Payer: PPO | Admitting: Family Medicine

## 2016-04-17 VITALS — BP 140/68 | HR 54 | Temp 98.7°F | Resp 16 | Ht 70.0 in | Wt 208.0 lb

## 2016-04-17 DIAGNOSIS — E785 Hyperlipidemia, unspecified: Secondary | ICD-10-CM

## 2016-04-17 DIAGNOSIS — F32A Depression, unspecified: Secondary | ICD-10-CM | POA: Insufficient documentation

## 2016-04-17 DIAGNOSIS — Z0000 Encounter for general adult medical examination without abnormal findings: Secondary | ICD-10-CM

## 2016-04-17 DIAGNOSIS — G4700 Insomnia, unspecified: Secondary | ICD-10-CM

## 2016-04-17 DIAGNOSIS — Z7289 Other problems related to lifestyle: Secondary | ICD-10-CM | POA: Diagnosis not present

## 2016-04-17 DIAGNOSIS — F329 Major depressive disorder, single episode, unspecified: Secondary | ICD-10-CM | POA: Diagnosis not present

## 2016-04-17 DIAGNOSIS — R5383 Other fatigue: Secondary | ICD-10-CM | POA: Diagnosis not present

## 2016-04-17 DIAGNOSIS — Z789 Other specified health status: Secondary | ICD-10-CM

## 2016-04-17 DIAGNOSIS — R7989 Other specified abnormal findings of blood chemistry: Secondary | ICD-10-CM | POA: Diagnosis not present

## 2016-04-17 MED ORDER — SERTRALINE HCL 50 MG PO TABS: ORAL_TABLET | ORAL | Status: DC

## 2016-04-17 MED ORDER — CYANOCOBALAMIN 1000 MCG/ML IJ SOLN
1000.0000 ug | Freq: Once | INTRAMUSCULAR | Status: AC
  Administered 2016-04-17: 1000 ug via INTRAMUSCULAR

## 2016-04-17 NOTE — Patient Instructions (Signed)
Do not consume more than one alcoholic beverage after supper

## 2016-04-17 NOTE — Progress Notes (Signed)
Patient: Ian Sanders, Male    DOB: 1941/02/20, 75 y.o.   MRN: [id number] Visit Date: 04/17/2016  Today's Provider: Lelon Huh, MD   Chief Complaint  Patient presents with  . Medicare Wellness  . Hypertension  . Diabetes  . Hypothyroidism  . Gastroesophageal Reflux  . Insomnia  . Hyperlipidemia   Subjective:    Annual wellness visit Ian Sanders is a 75 y.o. male. He feels poorly. He reports exercising no. He reports he is sleeping poorly/going to sleep.  -----------------------------------------------------------     Diabetes Mellitus Type II, Follow-up:   Lab Results  Component Value Date   HGBA1C 5.8 04/23/2015   Last seen for diabetes 1 years ago.  Management since then includes; no changes. He reports good compliance with treatment. He is not having side effects. none Current symptoms include none and have been unchanged. Home blood sugar records: fasting range: 112  Episodes of hypoglycemia? no   Current Insulin Regimen: n/a Most Recent Eye Exam: 6 months ago Weight trend: stable Prior visit with dietician: no Current diet: well balanced Current exercise: none  ----------------------------------------------------------------------   Hypertension, follow-up:  BP Readings from Last 3 Encounters:  04/17/16 140/68  12/26/15 144/76  08/20/15 126/70    He was last seen for hypertension 1 years ago.  BP at that visit was 120/70. Management since that visit includes;continue atenolol continue to stay off ACI's and ARB for now.He reports good compliance with treatment. He is not having side effects. none  He is not exercising. He is adherent to low salt diet.   Outside blood pressures are 140/80. He is experiencing none.  Patient denies none.   Cardiovascular risk factors include diabetes.  Use of agents associated with hypertension: none.    ----------------------------------------------------------------------    Lipid/Cholesterol, Follow-up:   Last seen for this 1 years ago.  Management since that visit includes; no changes.  Last Lipid Panel:    Component Value Date/Time   CHOL 150 04/29/2015 0804   TRIG 236* 04/29/2015 0804   HDL 29* 04/29/2015 0804   CHOLHDL 5.2* 04/29/2015 0804   LDLCALC 74 04/29/2015 0804    He reports good compliance with treatment. He is not having side effects. none  Wt Readings from Last 3 Encounters:  04/17/16 208 lb (94.348 kg)  12/26/15 205 lb 11.2 oz (93.305 kg)  08/20/15 202 lb (91.627 kg)    ---------------------------------------------------------------------- Follow up diabetes Had A1c=6.5 x 2 in 2015, but normalized to 58 when last checked June 2016. Avoids sweets in diet, but not checking blood sugars regularly. Has been much more fatigued lately.   Complains of feeling dizzy upon standing for the last few months. No spinning sensation. Associated with fatigue getting worse over the last few months. Not sleeping well. Can't fall asleep and wakes frequently throughout. Mood has been depressed. He is interested in trying an antidepressant. Took amitriptyline many years ago. He admits to drink 3 Bourbons every night after supper.     Review of Systems  Constitutional: Positive for fever, diaphoresis and activity change.  HENT: Positive for dental problem, mouth sores, sore throat and tinnitus.   Eyes: Negative.   Respiratory: Negative.   Cardiovascular: Negative.   Gastrointestinal: Positive for constipation.  Endocrine: Positive for polyuria.  Genitourinary: Negative.   Musculoskeletal: Positive for arthralgias.  Skin: Negative.   Allergic/Immunologic: Negative.   Neurological: Positive for dizziness.  Hematological: Negative.   Psychiatric/Behavioral: Positive for confusion, sleep disturbance and decreased concentration.  Social History   Social History  .  Marital Status: Married    Spouse Name: N/A  . Number of Children: N/A  . Years of Education: N/A   Occupational History  . Retired    Social History Main Topics  . Smoking status: Former Smoker -- 1.00 packs/day for 12 years    Quit date: 10/28/1971  . Smokeless tobacco: Former Systems developer    Types: Chew    Quit date: 01/02/2015  . Alcohol Use: 8.4 oz/week    14 Standard drinks or equivalent per week     Comment: moderate  . Drug Use: No  . Sexual Activity: Not on file   Other Topics Concern  . Not on file   Social History Narrative    Past Medical History  Diagnosis Date  . Cramp of limb   . Mononeuritis of unspecified site   . Other seborrheic keratosis   . Insomnia, unspecified   . Allergic rhinitis, cause unspecified   . Esophageal reflux   . Unspecified essential hypertension   . Benign paroxysmal positional vertigo   . Dizziness and giddiness   . Headache(784.0)   . Transient ischemic attack (TIA), and cerebral infarction without residual deficits   . Atrial fibrillation ([place])   . Sprain of unspecified site of sacroiliac region   . Unspecified hypothyroidism   . Pure hyperglyceridemia   . Lumbago   . Other malaise and fatigue   . Stroke ([place])   . Elevated PSA   . Right inguinal hernia   . History of chicken pox   . History of measles   . History of mumps      Patient Active Problem List   Diagnosis Date Noted  . Depression 04/17/2016  . Neuropathy ([place]) 04/16/2016  . Hernia, inguinal, right 04/16/2016  . Melena 04/16/2016  . Elevated liver function tests 04/16/2016  . Tobacco abuse 04/16/2016  . Urge incontinence 07/17/2015  . Incontinence 06/20/2015  . Prostate cancer ([place]) 06/20/2015  . Diabetes mellitus ([place]) 12/19/2013  . Elevated prostate specific antigen (PSA) 12/19/2013  . Groin pain, right lower quadrant 02/21/2013  . 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 HTN (hypertension) 12/14/2011  . Atrial  fibrillation, currently in sinus rhythm ([place]) 12/14/2011  . Hypothyroidism 05/13/2010  . History of CVA (cerebrovascular accident) 03/21/2010  . Headache 03/10/2010  . Benign paroxysmal positional vertigo 01/15/2010  . Insomnia 05/09/2009  . Allergic rhinitis 05/09/2009  . Esophageal reflux 05/09/2009    Past Surgical History  Procedure Laterality Date  . Hernia repair    . Finger amputation    . Thumb surgery    . Skin surgery      multiple  . Prostatectomy  1995    Abdominal  . Colonoscopy  05/11/2014    Tubular Adenoma. Dr. Allen Norris  . Carotid doppler ultrasound  04/02/2010    Small amount Calcified plaque bilaterally, no significant stenosis.  . Doppler echocardiography  04/02/2010    Mild left atrial dilation. Normal right atrium. No valvular disease. No thrombus. Normal LV function. EF>55%  . Mri brain with and without contrast  03/20/2010    Suggestive of basal ganglia lacunar infarct    His family history includes Bladder Cancer in his sister; Cerebral aneurysm in his sister; Heart attack in his mother; Heart disease in his sister; Heart disease (age of onset: 30) in his mother; Hypertension in his brother, brother, and mother; Kidney cancer in his brother and brother; Leukemia in his father;  Prostate cancer in his brother; Skin cancer in his brother; Stroke in his father.    Previous Medications   ATENOLOL-CHLORTHALIDONE (TENORETIC) 50-25 MG PER TABLET    Take 1 tablet by mouth daily.   BIOTIN PO    Take by mouth daily.   CALCIUM-VITAMIN D (OSCAL) 250-125 MG-UNIT PER TABLET    Take by mouth.   CYANOCOBALAMIN (VITAMIN B-12 PO)    Take by mouth daily.   DOCUSATE CALCIUM (STOOL SOFTENER PO)    Take by mouth as needed.   EPINEPHRINE (EPIPEN 2-PAK) 0.3 MG/0.3 ML IJ SOAJ INJECTION    Inject 0.3 mg into the muscle.   FLECAINIDE (TAMBOCOR) 100 MG TABLET    Take 1 tablet (100 mg total) by mouth 2 (two) times daily.   LEUPROLIDE ACETATE, 6 MONTH, (LUPRON) 45 MG INJECTION    Inject  45 mg into the muscle every 6 (six) months.   OMEPRAZOLE (PRILOSEC) 40 MG CAPSULE    Take 40 mg by mouth.   POLYETHYLENE GLYCOL 3350 (MIRALAX PO)    Take by mouth.   RIVAROXABAN (XARELTO) 20 MG TABS TABLET    Take 1 tablet (20 mg total) by mouth daily.   ZOLPIDEM (AMBIEN) 10 MG TABLET    Take 1 tablet (10 mg total) by mouth at bedtime as needed.    Patient Care Team: Birdie Sons, MD as PCP - General (Family Medicine) Robert Bellow, MD as Consulting Physician (General Surgery) Valinda Party, MD (Rheumatology) Minna Merritts, MD as Consulting Physician (Cardiology) Nori Riis, PA-C as Physician Assistant (Urology)     Objective:   Vitals: BP 140/68 mmHg  Pulse 54  Temp(Src) 98.7 F (37.1 C) (Oral)  Resp 16  Ht 5\' 10"  (1.778 m)  Wt 208 lb (94.348 kg)  BMI 29.84 kg/m2  SpO2 95%  Physical Exam   General Appearance:    Alert, cooperative, no distress, appears stated age  Head:    Normocephalic, without obvious abnormality, atraumatic  Eyes:    PERRL, conjunctiva/corneas clear, EOM's intact, fundi    benign, both eyes       Ears:    Normal TM's and external ear canals, both ears  Nose:   Nares normal, septum midline, mucosa normal, no drainage   or sinus tenderness  Throat:   Lips, mucosa, and tongue normal; teeth and gums normal  Neck:   Supple, symmetrical, trachea midline, no adenopathy;       thyroid:  No enlargement/tenderness/nodules; no carotid   bruit or JVD  Back:     Symmetric, no curvature, ROM normal, no CVA tenderness  Lungs:     Clear to auscultation bilaterally, respirations unlabored  Chest wall:    No tenderness or deformity  Heart:    Regular rate and rhythm, S1 and S2 normal, no murmur, rub   or gallop  Abdomen:     Soft, non-tender, bowel sounds active all four quadrants,    no masses, no organomegaly  Genitalia:    deferred  Rectal:    deferred  Extremities:   Extremities normal, atraumatic, no cyanosis or edema  Pulses:   2+ and  symmetric all extremities  Skin:   Skin color, texture, turgor normal, no rashes or lesions  Lymph nodes:   Cervical, supraclavicular, and axillary nodes normal  Neurologic:   CNII-XII intact. Normal strength, sensation and reflexes      throughout    Activities of Daily Living In your present state of health, do you  have any difficulty performing the following activities: 04/17/2016  Hearing? N  Vision? N  Difficulty concentrating or making decisions? Y  Walking or climbing stairs? Y  Dressing or bathing? N  Doing errands, shopping? N    Fall Risk Assessment Fall Risk  04/17/2016  Falls in the past year? No     Depression Screen PHQ 2/9 Scores 04/17/2016  PHQ - 2 Score 6  PHQ- 9 Score 17    Cognitive Testing - 6-CIT  Correct? Score   What year is it? yes 0 0 or 4  What month is it? yes 0 0 or 3  Memorize:    Ian Sanders,  41,  [place],      What time is it? (within 1 hour) yes 0 0 or 3  Count backwards from 20 yes 0 0, 2, or 4  Name the months of the year yes 2 0, 2, or 4  Repeat name & address above yes 0 0, 2, 4, 6, 8, or 10       TOTAL SCORE  2/28   Interpretation:  Normal  Normal (0-7) Abnormal (8-28)    Audit-C Alcohol Use Screening  Question Answer Points  How often do you have alcoholic drink? 4 or more times weekly 4  On days you do drink alcohol, how many drinks do you typically consume? 3 or 4 1  How oftey will you drink 6 or more in a total? never 0  Total Score:  5   A score of 3 or more in women, and 4 or more in men indicates increased risk for alcohol abuse, EXCEPT if all of the points are from question 1.     Assessment & Plan:    Annual Physical Reviewed patient's Family Medical History Reviewed and updated list of patient's medical providers Assessment of cognitive impairment was done Assessed patient's functional ability Established a written schedule for health screening [place] Completed and  Reviewed  Exercise Activities and Dietary recommendations Goals    None      Immunization History  Administered Date(s) Administered  . Influenza, High Dose Seasonal PF 08/09/2015  . Pneumococcal Conjugate-13 12/15/2013    Health Maintenance  Topic Date Due  . FOOT EXAM  10/22/1950  . OPHTHALMOLOGY EXAM  10/22/1950  . TETANUS/TDAP  10/23/1959  . COLONOSCOPY  10/22/1990  . ZOSTAVAX  10/22/2000  . PNA vac Low Risk Adult (2 of 2 - PPSV23) 12/15/2014  . HEMOGLOBIN A1C  10/23/2015  . INFLUENZA VACCINE  06/16/2016      Discussed health benefits of physical activity, and encouraged him to engage in regular exercise appropriate for his age and condition.    --------------------------------------------------------------------------------  1. Annual physical exam  - Comprehensive metabolic panel - TSH - CBC  2. Other fatigue Long Ddx, likely some underlying depression as below. Check labs above. He states he had B12 injection a few years ago and felt like 'a new man' 52.  - cyanocobalamin ((VITAMIN B-12)) injection 1,000 mcg; Inject 1 mL (1,000 mcg total) into the muscle once.  3. Insomnia Likely exacerbated by depression. Advised to cut back on alcohol in the evening to no more than one a day. Counseled on cognitive side effects of Ambien and recommended reducing to 1/2 tablet at night, and try sleeping without medication occasionally.   4. Dyslipidemia  - Lipid panel  5. Depression Start SSRI.  - sertraline (ZOLOFT) 50 MG tablet; 1/2 tablet for 6 days, then  increase to 1 tablet per day  Dispense: 30 tablet; Refill: 3  Follow up 1 months.

## 2016-04-18 LAB — CBC
Hematocrit: 40.8 % (ref 37.5–51.0)
Hemoglobin: 14.1 g/dL (ref 12.6–17.7)
MCH: 29.6 pg (ref 26.6–33.0)
MCHC: 34.6 g/dL (ref 31.5–35.7)
MCV: 86 fL (ref 79–97)
Platelets: 106 10*3/uL — ABNORMAL LOW (ref 150–379)
RBC: 4.76 x10E6/uL (ref 4.14–5.80)
RDW: 14.1 % (ref 12.3–15.4)
WBC: 5.4 10*3/uL (ref 3.4–10.8)

## 2016-04-18 LAB — LIPID PANEL
CHOL/HDL RATIO: 3.8 ratio (ref 0.0–5.0)
CHOLESTEROL TOTAL: 119 mg/dL (ref 100–199)
HDL: 31 mg/dL — AB (ref >39)
LDL CALC: 42 mg/dL (ref 0–99)
TRIGLYCERIDES: 228 mg/dL — AB (ref 0–149)
VLDL Cholesterol Cal: 46 mg/dL — ABNORMAL HIGH (ref 5–40)

## 2016-04-18 LAB — COMPREHENSIVE METABOLIC PANEL
ALK PHOS: 83 IU/L (ref 39–117)
ALT: 58 IU/L — AB (ref 0–44)
AST: 81 IU/L — AB (ref 0–40)
Albumin/Globulin Ratio: 1.6 (ref 1.2–2.2)
Albumin: 4.5 g/dL (ref 3.5–4.8)
BUN/Creatinine Ratio: 8 — ABNORMAL LOW (ref 10–24)
BUN: 9 mg/dL (ref 8–27)
Bilirubin Total: 0.9 mg/dL (ref 0.0–1.2)
CALCIUM: 9.2 mg/dL (ref 8.6–10.2)
CO2: 28 mmol/L (ref 18–29)
CREATININE: 1.09 mg/dL (ref 0.76–1.27)
Chloride: 92 mmol/L — ABNORMAL LOW (ref 96–106)
GFR calc Af Amer: 76 mL/min/{1.73_m2} (ref >59)
GFR, EST NON AFRICAN AMERICAN: 66 mL/min/{1.73_m2} (ref >59)
GLUCOSE: 161 mg/dL — AB (ref 65–99)
Globulin, Total: 2.8 g/dL (ref 1.5–4.5)
Potassium: 4 mmol/L (ref 3.5–5.2)
SODIUM: 137 mmol/L (ref 134–144)
Total Protein: 7.3 g/dL (ref 6.0–8.5)

## 2016-04-18 LAB — TSH: TSH: 3.26 u[IU]/mL (ref 0.450–4.500)

## 2016-04-20 ENCOUNTER — Telehealth: Payer: Self-pay | Admitting: *Deleted

## 2016-04-20 DIAGNOSIS — R7989 Other specified abnormal findings of blood chemistry: Secondary | ICD-10-CM

## 2016-04-20 DIAGNOSIS — R945 Abnormal results of liver function studies: Principal | ICD-10-CM

## 2016-04-20 NOTE — Telephone Encounter (Signed)
Patient was notified of results. Patient expressed understanding. Order in epic for [place]. Labs also added.

## 2016-04-20 NOTE — Telephone Encounter (Signed)
-----   Message from Birdie Sons, MD sent at 04/19/2016  9:46 PM EDT ----- Blood sugar is high at 161. Liver functions are mildly elevated. Please add hepatitis C antibody, hepatitis B surface antigen, and hepatitis B core antibody. Also need abdominal ultrasound. Need to schedule follow up o.v. 1- 2 days after ultrasound to review ultrasound, and check [id number]

## 2016-04-20 NOTE — Telephone Encounter (Signed)
Please schedule US abdomin? Thanks!

## 2016-04-21 LAB — HEPATITIS B CORE ANTIBODY, TOTAL: HEP B C TOTAL AB: NEGATIVE

## 2016-04-21 LAB — HEPATITIS C ANTIBODY

## 2016-04-21 LAB — SPECIMEN STATUS REPORT

## 2016-04-21 LAB — HEPATITIS B SURFACE ANTIGEN: HEP B S AG: NEGATIVE

## 2016-04-23 ENCOUNTER — Ambulatory Visit
Admission: RE | Admit: 2016-04-23 | Discharge: 2016-04-23 | Disposition: A | Discharge status: Home / Self Care | Payer: PPO | Source: Ambulatory Visit | Attending: Family Medicine | Admitting: Family Medicine

## 2016-04-23 DIAGNOSIS — R7989 Other specified abnormal findings of blood chemistry: Secondary | ICD-10-CM | POA: Diagnosis not present

## 2016-04-23 DIAGNOSIS — R945 Abnormal results of liver function studies: Secondary | ICD-10-CM

## 2016-04-23 DIAGNOSIS — R161 Splenomegaly, not elsewhere classified: Secondary | ICD-10-CM | POA: Diagnosis not present

## 2016-04-23 DIAGNOSIS — K8020 Calculus of gallbladder without cholecystitis without obstruction: Secondary | ICD-10-CM | POA: Diagnosis not present

## 2016-04-27 ENCOUNTER — Telehealth: Payer: Self-pay | Admitting: *Deleted

## 2016-04-27 DIAGNOSIS — N2889 Other specified disorders of kidney and ureter: Secondary | ICD-10-CM

## 2016-04-27 NOTE — Telephone Encounter (Signed)
Patient was notified of results. Patient expressed understanding. 

## 2016-04-27 NOTE — Telephone Encounter (Signed)
Please schedule renal protocol CT.

## 2016-04-27 NOTE — Telephone Encounter (Signed)
-----   Message from Birdie Sons, MD sent at 04/27/2016  7:58 AM EDT ----- CT shows gallstones, enlarged pancreas, and cystic mass on left kidney which needs further evaluation. Please advise and forward to sarah to schedule renal protocol CT.

## 2016-04-29 ENCOUNTER — Other Ambulatory Visit: Payer: Self-pay | Admitting: *Deleted

## 2016-04-29 MED ORDER — ZOLPIDEM TARTRATE 10 MG PO TABS: 5.0000 mg | ORAL_TABLET | Freq: Every evening | ORAL | Status: DC | PRN

## 2016-04-29 NOTE — Telephone Encounter (Signed)
Order in epic. 

## 2016-04-29 NOTE — Telephone Encounter (Signed)
Please call in zolpidem  

## 2016-04-29 NOTE — Telephone Encounter (Signed)
Please enter referral in EPIC. I think the test you are looking for is [id number]

## 2016-04-29 NOTE — Telephone Encounter (Signed)
Prescription phoned into pharmacy.

## 2016-04-30 DIAGNOSIS — K1123 Chronic sialoadenitis: Secondary | ICD-10-CM | POA: Diagnosis not present

## 2016-05-04 ENCOUNTER — Ambulatory Visit
Admission: RE | Admit: 2016-05-04 | Discharge: 2016-05-04 | Disposition: A | Discharge status: Home / Self Care | Payer: PPO | Source: Ambulatory Visit | Attending: Family Medicine | Admitting: Family Medicine

## 2016-05-04 DIAGNOSIS — I700 Atherosclerosis of aorta: Secondary | ICD-10-CM | POA: Diagnosis not present

## 2016-05-04 DIAGNOSIS — R161 Splenomegaly, not elsewhere classified: Secondary | ICD-10-CM | POA: Diagnosis not present

## 2016-05-04 DIAGNOSIS — K760 Fatty (change of) liver, not elsewhere classified: Secondary | ICD-10-CM | POA: Insufficient documentation

## 2016-05-04 DIAGNOSIS — K8020 Calculus of gallbladder without cholecystitis without obstruction: Secondary | ICD-10-CM | POA: Insufficient documentation

## 2016-05-04 DIAGNOSIS — N2889 Other specified disorders of kidney and ureter: Secondary | ICD-10-CM | POA: Diagnosis not present

## 2016-05-05 DIAGNOSIS — K7460 Unspecified cirrhosis of liver: Secondary | ICD-10-CM | POA: Insufficient documentation

## 2016-05-05 DIAGNOSIS — I700 Atherosclerosis of aorta: Secondary | ICD-10-CM | POA: Insufficient documentation

## 2016-05-05 DIAGNOSIS — K760 Fatty (change of) liver, not elsewhere classified: Secondary | ICD-10-CM | POA: Insufficient documentation

## 2016-05-08 DIAGNOSIS — K1123 Chronic sialoadenitis: Secondary | ICD-10-CM | POA: Diagnosis not present

## 2016-05-21 ENCOUNTER — Ambulatory Visit (INDEPENDENT_AMBULATORY_CARE_PROVIDER_SITE_OTHER): Payer: PPO | Admitting: Family Medicine

## 2016-05-21 ENCOUNTER — Encounter: Payer: Self-pay | Admitting: Family Medicine

## 2016-05-21 VITALS — BP 120/58 | HR 56 | Temp 98.7°F | Resp 16 | Ht 70.0 in | Wt 208.0 lb

## 2016-05-21 DIAGNOSIS — R7401 Elevation of levels of liver transaminase levels: Secondary | ICD-10-CM

## 2016-05-21 DIAGNOSIS — F32A Depression, unspecified: Secondary | ICD-10-CM

## 2016-05-21 DIAGNOSIS — R739 Hyperglycemia, unspecified: Secondary | ICD-10-CM | POA: Diagnosis not present

## 2016-05-21 DIAGNOSIS — R740 Nonspecific elevation of levels of transaminase and lactic acid dehydrogenase [LDH]: Secondary | ICD-10-CM

## 2016-05-21 DIAGNOSIS — F329 Major depressive disorder, single episode, unspecified: Secondary | ICD-10-CM | POA: Diagnosis not present

## 2016-05-21 NOTE — Progress Notes (Signed)
Patient: Ian Sanders Male    DOB: Aug 10, 1941   74 y.o.   MRN: [id number] Visit Date: 05/21/2016  Today's Provider: Lelon Huh, MD   Chief Complaint  Patient presents with  . Follow-up  . Insomnia  . Depression   Subjective:    HPI  Depression: From 04/17/16-Started SSRI. sertraline (ZOLOFT) 50 MG tablet; 1/2 tablet for 6 days, then increase to 1 tablet per day. He states he hasn't really noticed any change in his mood  Or energy levels. He is not having any adverse effects from medications.   Insomnia: From 04/17/16-Likely exacerbated by depression. Advised to cut back on alcohol in the evening to no more than one a day. Counseled on cognitive side effects of Ambien and recommended reducing to 1/2 tablet at night. He states he has been working on reducing alcohol consumption, but still drinking at least 2 early in the evening every evening.  He is also here to follow up on elevated blood sugar and LFTs. Has been avoiding sugary foods in diet.   CMP Latest Ref Rng 04/17/2016 04/29/2015 06/19/2014  Glucose 65 - 99 mg/dL 161(H) 101(H) -  BUN 8 - 27 mg/dL 9 10 14   Creatinine 0.76 - 1.27 mg/dL 1.09 1.15 1.2  Sodium 134 - 144 mmol/L 137 141 140  Potassium 3.5 - 5.2 mmol/L 4.0 3.4(L) 4.2  Chloride 96 - 106 mmol/L 92(L) 94(L) -  CO2 18 - 29 mmol/L 28 31(H) -  Calcium 8.6 - 10.2 mg/dL 9.2 9.1 -  Total Protein 6.0 - 8.5 g/dL 7.3 - -  Total Bilirubin 0.0 - 1.2 mg/dL 0.9 - -  Alkaline Phos 39 - 117 IU/L 83 - -  AST 0 - 40 IU/L 81(H) - -  ALT 0 - 44 IU/L 58(H) - -      Allergies  Allergen Reactions  . Lisinopril Swelling    Tongue swelling  . Myrbetriq [Mirabegron] Swelling    Of the tongue  . Atorvastatin     Dizziness and Fatigue   . Dabigatran Etexilate Mesylate     Stomach ulcers (Pradaxa)   Current Meds  Medication Sig  . atenolol-chlorthalidone (TENORETIC) 50-25 MG per tablet Take 1 tablet by mouth daily.  Marland Kitchen BIOTIN PO Take by mouth daily.  . calcium-vitamin D  (OSCAL) 250-125 MG-UNIT per tablet Take by mouth.  . Cyanocobalamin (VITAMIN B-12 PO) Take by mouth daily.  Mariane Baumgarten Calcium (STOOL SOFTENER PO) Take by mouth as needed.  Marland Kitchen EPINEPHrine (EPIPEN 2-PAK) 0.3 mg/0.3 mL IJ SOAJ injection Inject 0.3 mg into the muscle.  . flecainide (TAMBOCOR) 100 MG tablet Take 1 tablet (100 mg total) by mouth 2 (two) times daily.  Marland Kitchen Leuprolide Acetate, 6 Month, (LUPRON) 45 MG injection Inject 45 mg into the muscle every 6 (six) months.  Marland Kitchen omeprazole (PRILOSEC) 40 MG capsule Take 40 mg by mouth.  . rivaroxaban (XARELTO) 20 MG TABS tablet Take 1 tablet (20 mg total) by mouth daily.  . sertraline (ZOLOFT) 50 MG tablet 1/2 tablet for 6 days, then increase to 1 tablet per day  . zolpidem (AMBIEN) 10 MG tablet Take 0.5-1 tablets (5-10 mg total) by mouth at bedtime as needed.    Review of Systems  Constitutional: Negative for fever, chills and appetite change.  Respiratory: Negative for chest tightness, shortness of breath and wheezing.   Cardiovascular: Negative for chest pain and palpitations.  Gastrointestinal: Negative for nausea, vomiting and abdominal pain.    Social  History  Substance Use Topics  . Smoking status: Former Smoker -- 1.00 packs/day for 12 years    Quit date: 10/28/1971  . Smokeless tobacco: Former Systems developer    Types: Chew    Quit date: 01/02/2015  . Alcohol Use: 8.4 oz/week    14 Standard drinks or equivalent per week     Comment: moderate   Objective:   BP 120/58 mmHg  Pulse 56  Temp(Src) 98.7 F (37.1 C) (Oral)  Resp 16  Ht 5\' 10"  (1.778 m)  Wt 208 lb (94.348 kg)  BMI 29.84 kg/m2  SpO2 96%  Physical Exam   General Appearance:    Alert, cooperative, no distress  Eyes:    PERRL, conjunctiva/corneas clear, EOM's intact       Lungs:     Clear to auscultation bilaterally, respirations unlabored  Heart:    Regular rate and rhythm  Neurologic:   Awake, alert, oriented x 3. No apparent focal neurological           defect.             Assessment & Plan:     1. Hyperglycemia  - Hemoglobin A1c  2. Elevated transaminase level Likely secondary to alcohol which he is working on reducing - Hepatic function panel  3. Depression Tolerating sertraline well. Continue current dose for now.        Lelon Huh, MD  [facility]

## 2016-05-22 ENCOUNTER — Telehealth: Payer: Self-pay

## 2016-05-22 LAB — HEPATIC FUNCTION PANEL
ALBUMIN: 4.4 g/dL (ref 3.5–4.8)
ALK PHOS: 87 IU/L (ref 39–117)
ALT: 44 IU/L (ref 0–44)
AST: 65 IU/L — AB (ref 0–40)
BILIRUBIN TOTAL: 0.8 mg/dL (ref 0.0–1.2)
Bilirubin, Direct: 0.21 mg/dL (ref 0.00–0.40)
Total Protein: 6.9 g/dL (ref 6.0–8.5)

## 2016-05-22 LAB — HEMOGLOBIN A1C
Est. average glucose Bld gHb Est-mCnc: 134 mg/dL
Hgb A1c MFr Bld: 6.3 % — ABNORMAL HIGH (ref 4.8–5.6)

## 2016-05-22 NOTE — Telephone Encounter (Signed)
Patient advised as below.  

## 2016-05-22 NOTE — Telephone Encounter (Signed)
-----   Message from Birdie Sons, MD sent at 05/22/2016  1:19 PM EDT ----- Liver functions are still slightly elevated, but much better. Labs show his blood sugar is averaging 134, which is nearly into diabetic range (140 or higher) This should improve if he cuts back on alcohol consumption. Continue current medications for now and follow up 2 months.

## 2016-05-29 ENCOUNTER — Ambulatory Visit (INDEPENDENT_AMBULATORY_CARE_PROVIDER_SITE_OTHER): Payer: PPO | Admitting: Family Medicine

## 2016-05-29 ENCOUNTER — Encounter: Payer: Self-pay | Admitting: Family Medicine

## 2016-05-29 VITALS — BP 126/60 | HR 58 | Temp 97.9°F | Resp 16 | Wt 204.0 lb

## 2016-05-29 DIAGNOSIS — Z23 Encounter for immunization: Secondary | ICD-10-CM | POA: Diagnosis not present

## 2016-05-29 DIAGNOSIS — S41111A Laceration without foreign body of right upper arm, initial encounter: Secondary | ICD-10-CM

## 2016-05-29 DIAGNOSIS — R1314 Dysphagia, pharyngoesophageal phase: Secondary | ICD-10-CM | POA: Diagnosis not present

## 2016-05-29 DIAGNOSIS — R682 Dry mouth, unspecified: Secondary | ICD-10-CM | POA: Diagnosis not present

## 2016-05-29 DIAGNOSIS — K219 Gastro-esophageal reflux disease without esophagitis: Secondary | ICD-10-CM | POA: Diagnosis not present

## 2016-05-29 NOTE — Patient Instructions (Signed)
Cleanse laceration with soap and water until healed.

## 2016-05-29 NOTE — Progress Notes (Signed)
Subjective:     Patient ID: Ian Sanders, male   DOB: February 06, 1941, 75 y.o.   MRN: [id number]  HPI  Chief Complaint  Patient presents with  . Arm Injury    2 days ago on a rusty metal  States he is unsure when he last had a tetanus shot. Reports prior Tdap years ago.   Review of Systems     Objective:   Physical Exam  Constitutional: He appears well-developed and well-nourished. No distress.  Skin:  Right forearm with a superficial laceration. No drainage or cellulitis noted.       Assessment:    1. Laceration of arm, right, initial encounter: superficial  2. Need for tetanus booster - Td : Tetanus/diphtheria >7yo Preservative  free    Plan:    Discussed cleansing with soap and water

## 2016-06-23 ENCOUNTER — Other Ambulatory Visit: Payer: Self-pay

## 2016-06-23 DIAGNOSIS — C61 Malignant neoplasm of prostate: Secondary | ICD-10-CM

## 2016-06-24 ENCOUNTER — Other Ambulatory Visit: Payer: PPO

## 2016-06-24 DIAGNOSIS — C61 Malignant neoplasm of prostate: Secondary | ICD-10-CM | POA: Diagnosis not present

## 2016-06-25 ENCOUNTER — Other Ambulatory Visit: Payer: Self-pay | Admitting: Family Medicine

## 2016-06-25 LAB — PSA: Prostate Specific Ag, Serum: <0.1 ng/mL (ref 0.0–4.0)

## 2016-06-28 NOTE — Progress Notes (Signed)
06/29/2016 1:57 PM   Ian Sanders 1941/02/24 [id number]  Referring provider: Birdie Sons, MD [address] [name], Ian Sanders  Chief Complaint  Patient presents with  . Follow-up    Prostate cancer    HPI: Ian Sanders is a 75 year old Caucasian with prostate cancer and incontinence who presents today for his 6 months Lupron.  Prostate cancer: Patient underwent RRP by Dr. Domingo Cocking at [facility] in 1995. His PSA remained below 1 until 2014 when it rose to 9.8 ng/mL. He was started on ADT with [place] in 03/15. He has received [place] for 4 months and then was switched to a longer acting agent, Lupron. He is having hot flashes, but he finds them manageable. He is taking 600mg  of calcium twice daily and vitamin d 400mg  twice daily. He reports a good appetite, no weight loss and no back pain. He has not had any recent fevers, chills, nausea and/or vomiting.  His last PSA was <0.1 ng/mL on 06/24/2016. He is no longer experiencing hot flashes.    Incontinence: Patient's urinary symptoms are stable.  He has not had dysuria, fevers, chills, nausea or vomiting. He also denies suprapubic pain. He was seeing some improvement with Myrbetriq, but he experienced tongue swelling and discontinued the medication.  His current IPSS score is 17/4.  His previous IPSS score today was 17/4.  We did again briefly discuss the male sling, but he states the incontinence is not that bad.        IPSS    Row Name 06/29/16 1300         International Prostate Symptom Score   How often have you had the sensation of not emptying your bladder? Not at All     How often have you had to urinate less than every two hours? About half the time     How often have you found you stopped and started again several times when you urinated? Less than half the time     How often have you found it difficult to postpone urination? Almost always     How often have you had a weak urinary stream? Almost  always     How often have you had to strain to start urination? Not at All     How many times did you typically get up at night to urinate? 2 Times     Total IPSS Score 17       Quality of Life due to urinary symptoms   If you were to spend the rest of your life with your urinary condition just the way it is now how would you feel about that? Mostly Disatisfied        Score:  1-7 Mild 8-19 Moderate 20-35 Severe    PMH: Past Medical History:  Diagnosis Date  . Atrial fibrillation ([place])   . Benign paroxysmal positional vertigo   . History of chicken pox   . History of measles   . History of mumps   . Mononeuritis of unspecified site   . Other seborrheic keratosis   . Stroke ([place])   . Transient ischemic attack (TIA), and cerebral infarction without residual deficits   . Unspecified hypothyroidism     Surgical History: Past Surgical History:  Procedure Laterality Date  . Carotid Doppler Ultrasound  04/02/2010   Small amount Calcified plaque bilaterally, no significant stenosis.  . COLONOSCOPY  05/11/2014   Tubular Adenoma. Dr. Allen Norris  . DOPPLER ECHOCARDIOGRAPHY  04/02/2010   Mild left atrial dilation. Normal right atrium. No valvular disease. No thrombus. Normal LV function. EF>55%  . FINGER AMPUTATION    . HERNIA REPAIR    . MRI Brain with and without contrast  03/20/2010   Suggestive of basal ganglia lacunar infarct  . PROSTATECTOMY  1995   Abdominal  . SKIN SURGERY     multiple  . Thumb surgery      Home Medications:    Medication List       Accurate as of 06/29/16  1:57 PM. Always use your most recent med list.          atenolol-chlorthalidone 50-25 MG tablet Commonly known as:  TENORETIC TAKE 1 TABLET DAILY.   BIOTIN PO Take by mouth daily.   calcium-vitamin D 250-125 MG-UNIT tablet Commonly known as:  OSCAL Take by mouth.   EPIPEN 2-PAK 0.3 mg/0.3 mL Soaj injection Generic drug:  EPINEPHrine Inject 0.3 mg into the muscle.   flecainide 100 MG  tablet Commonly known as:  TAMBOCOR Take 1 tablet (100 mg total) by mouth 2 (two) times daily.   Leuprolide Acetate (6 Month) 45 MG injection Commonly known as:  LUPRON Inject 45 mg into the muscle every 6 (six) months.   omeprazole 40 MG capsule Commonly known as:  PRILOSEC Take 40 mg by mouth.   rivaroxaban 20 MG Tabs tablet Commonly known as:  XARELTO Take 1 tablet (20 mg total) by mouth daily.   sertraline 50 MG tablet Commonly known as:  ZOLOFT 1/2 tablet for 6 days, then increase to 1 tablet per day   STOOL SOFTENER PO Take by mouth as needed.   VITAMIN B-12 PO Take by mouth daily.   zolpidem 10 MG tablet Commonly known as:  AMBIEN Take 0.5-1 tablets (5-10 mg total) by mouth at bedtime as needed.       Allergies:  Allergies  Allergen Reactions  . Lisinopril Swelling    Tongue swelling  . Myrbetriq [Mirabegron] Swelling    Of the tongue  . Atorvastatin     Dizziness and Fatigue   . Dabigatran Etexilate Mesylate     Stomach ulcers (Pradaxa)    Family History: Family History  Problem Relation Age of Onset  . Heart disease Mother 29    CABG  . Hypertension Mother   . Heart attack Mother   . Hypertension Brother   . Heart disease Sister     stents  . Prostate cancer Brother   . Kidney cancer Brother   . Skin cancer Brother   . Hypertension Brother   . Kidney cancer Brother   . Bladder Cancer Sister   . Leukemia Father   . Stroke Father     multiple  . Cerebral aneurysm Sister     Social History:  reports that he quit smoking about 44 years ago. He has a 12.00 pack-year smoking history. He quit smokeless tobacco use about 17 months ago. His smokeless tobacco use included Chew. He reports that he does not drink alcohol or use drugs.  ROS: UROLOGY Frequent Urination?: No Hard to postpone urination?: Yes Burning/pain with urination?: No Get up at night to urinate?: Yes Leakage of urine?: Yes Urine stream starts and stops?: No Trouble  starting stream?: No Do you have to strain to urinate?: No Blood in urine?: No Urinary tract infection?: No Sexually transmitted disease?: No Injury to kidneys or bladder?: No Painful intercourse?: No Weak stream?: Yes Erection problems?: Yes Penile pain?: No  Gastrointestinal  Nausea?: No Vomiting?: No Indigestion/heartburn?: No Diarrhea?: No Constipation?: Yes  Constitutional Fever: No Night sweats?: No Weight loss?: No Fatigue?: Yes  Skin Skin rash/lesions?: No Itching?: No  Eyes Blurred vision?: No Double vision?: No  Ears/Nose/Throat Sore throat?: Yes Sinus problems?: Yes  Hematologic/Lymphatic Swollen glands?: No Easy bruising?: No  Cardiovascular Leg swelling?: No Chest pain?: No  Respiratory Cough?: No Shortness of breath?: No  Endocrine Excessive thirst?: No  Musculoskeletal Back pain?: No Joint pain?: No  Neurological Headaches?: No Dizziness?: No  Psychologic Depression?: No Anxiety?: No  Physical Exam: BP 138/74 (BP Location: Left Arm, Cuff Size: Normal)   Pulse 66   Ht 5\' 10"  (1.778 m)   Wt 201 lb 3.2 oz (91.3 kg)   BMI 28.87 kg/m   Constitutional: Well nourished. Alert and oriented, No acute distress. HEENT: [place] AT, moist mucus membranes. Trachea midline, no masses. Cardiovascular: No clubbing, cyanosis, or edema. Respiratory: Normal respiratory effort, no increased work of breathing. GI: Abdomen is soft, non tender, non distended, no abdominal masses. Liver and spleen not palpable.  No hernias appreciated.  Stool sample for occult testing is not indicated.   GU: No CVA tenderness.  No bladder fullness or masses.  Patient with uncircumcised phallus. Foreskin easily retracted Urethral meatus is patent.  No penile discharge. No penile lesions or rashes. Scrotum without lesions, cysts, rashes and/or edema.  Testicles are located scrotally bilaterally. No masses are appreciated in the testicles. Left and right epididymis are  normal. Rectal: Patient with  normal sphincter tone. Anus and perineum without scarring or rashes. No rectal masses are appreciated. Prostate and seminal vesicles are surgically absent.  No rectal masses.    Skin: No rashes, bruises or suspicious lesions. Lymph: No cervical or inguinal adenopathy. Neurologic: Grossly intact, no focal deficits, moving all 4 extremities. Psychiatric: Normal mood and affect.  Laboratory Data: Lab Results  Component Value Date   WBC 5.4 04/17/2016   HGB 12.8 (A) 02/28/2015   HCT 40.8 04/17/2016   MCV 86 04/17/2016   PLT 106 (L) 04/17/2016    Lab Results  Component Value Date   CREATININE 1.09 04/17/2016   Lab Results  Component Value Date   HGBA1C 6.3 (H) 05/21/2016      Component Value Date/Time   CHOL 119 04/17/2016 1119   HDL 31 (L) 04/17/2016 1119   CHOLHDL 3.8 04/17/2016 1119   LDLCALC 42 04/17/2016 1119    Lab Results  Component Value Date   AST 65 (H) 05/21/2016   Lab Results  Component Value Date   ALT 44 05/21/2016    Pertinent Imaging: Results for Ian Sanders, Ian Sanders (MRN [id number]) as of 12/26/2015 13:40  Ref. Range 12/26/2015 13:31  Scan Result Unknown 0    Assessment & Plan:    1. Prostate cancer:   Patient underwent RRP in 1995. His PSA remained below 1 until 2014 when it rose to 9.8 ng/mL. He was started on ADT and his PSA returned to <0.1ng/mL. He will RTC in 6 months for lupron, PSA and exam.    - Leuprolide Acetate (6 Month) (LUPRON) injection 45 mg; Inject 45 mg into the muscle once.  2. Incontinence: Patient experienced tongue swelling while on the Myrbetriq, so he discontinued the medication.  He will manage the incontinence with depends at this time. We did briefly discuss a male sling procedure, but he like to defer that until later date.  Return in about 6 months (around 12/30/2016) for lupron, exam, IPSS and PSA.  These notes generated with voice recognition software. I apologize for typographical  errors.  Zara Council, [facility] [address], [place], [place] [address] [phone]

## 2016-06-29 ENCOUNTER — Ambulatory Visit (INDEPENDENT_AMBULATORY_CARE_PROVIDER_SITE_OTHER): Payer: PPO | Admitting: Urology

## 2016-06-29 ENCOUNTER — Encounter: Payer: Self-pay | Admitting: Urology

## 2016-06-29 VITALS — BP 138/74 | HR 66 | Ht 70.0 in | Wt 201.2 lb

## 2016-06-29 DIAGNOSIS — C61 Malignant neoplasm of prostate: Secondary | ICD-10-CM

## 2016-06-29 DIAGNOSIS — M545 Low back pain, unspecified: Secondary | ICD-10-CM | POA: Insufficient documentation

## 2016-06-29 DIAGNOSIS — R32 Unspecified urinary incontinence: Secondary | ICD-10-CM

## 2016-06-29 DIAGNOSIS — L858 Other specified epidermal thickening: Secondary | ICD-10-CM | POA: Insufficient documentation

## 2016-06-29 DIAGNOSIS — S91019A Laceration without foreign body, unspecified ankle, initial encounter: Secondary | ICD-10-CM | POA: Insufficient documentation

## 2016-06-29 DIAGNOSIS — R220 Localized swelling, mass and lump, head: Secondary | ICD-10-CM | POA: Insufficient documentation

## 2016-06-29 MED ORDER — LEUPROLIDE ACETATE (6 MONTH) 45 MG IM KIT
45.0000 mg | PACK | Freq: Once | INTRAMUSCULAR | Status: AC
  Administered 2016-06-29: 45 mg via INTRAMUSCULAR

## 2016-06-29 NOTE — Progress Notes (Signed)
Lupron IM Injection   Due to Prostate Cancer patient is present today for a Lupron Injection.  Medication: Lupron 6 month Dose: 45 mg  Location: right upper outer buttocks Lot: [id number] Exp: 07/23/2017  Patient tolerated well, no complications were noted  Performed by: Elberta Leatherwood, [place]

## 2016-07-23 ENCOUNTER — Encounter: Payer: Self-pay | Admitting: Family Medicine

## 2016-07-23 ENCOUNTER — Ambulatory Visit (INDEPENDENT_AMBULATORY_CARE_PROVIDER_SITE_OTHER): Payer: PPO | Admitting: Family Medicine

## 2016-07-23 VITALS — BP 120/60 | HR 56 | Temp 97.7°F | Resp 16 | Ht 70.0 in | Wt 202.0 lb

## 2016-07-23 DIAGNOSIS — R740 Nonspecific elevation of levels of transaminase and lactic acid dehydrogenase [LDH]: Secondary | ICD-10-CM | POA: Diagnosis not present

## 2016-07-23 DIAGNOSIS — Z23 Encounter for immunization: Secondary | ICD-10-CM | POA: Diagnosis not present

## 2016-07-23 DIAGNOSIS — R739 Hyperglycemia, unspecified: Secondary | ICD-10-CM

## 2016-07-23 DIAGNOSIS — G4700 Insomnia, unspecified: Secondary | ICD-10-CM | POA: Diagnosis not present

## 2016-07-23 DIAGNOSIS — R7401 Elevation of levels of liver transaminase levels: Secondary | ICD-10-CM

## 2016-07-23 NOTE — Progress Notes (Signed)
Patient: Ian Sanders Male    DOB: May 24, 1941   75 y.o.   MRN: [id number] Visit Date: 07/23/2016  Today's Provider: Lelon Huh, MD   Chief Complaint  Patient presents with  . Follow-up  . Hyperglycemia   Subjective:    HPI  Hyperglycemia: From 05/21/2016-Labs checked, showed his blood sugar is averaging 134, which is nearly into diabetic range (140 or higher) Expected to improve if he cuts back on alcohol consumption.   Elevated transaminase level: CMP Latest Ref Rng & Units 05/21/2016 04/17/2016 04/29/2015  Glucose 65 - 99 mg/dL - 161(H) 101(H)  BUN 8 - 27 mg/dL - 9 10  Creatinine 0.76 - 1.27 mg/dL - 1.09 1.15  Sodium 134 - 144 mmol/L - 137 141  Potassium 3.5 - 5.2 mmol/L - 4.0 3.4(L)  Chloride 96 - 106 mmol/L - 92(L) 94(L)  CO2 18 - 29 mmol/L - 28 31(H)  Calcium 8.6 - 10.2 mg/dL - 9.2 9.1  Total Protein 6.0 - 8.5 g/dL 6.9 7.3 -  Total Bilirubin 0.0 - 1.2 mg/dL 0.8 0.9 -  Alkaline Phos 39 - 117 IU/L 87 83 -  AST 0 - 40 IU/L 65(H) 81(H) -  ALT 0 - 44 IU/L 44 58(H) -    He was drinking several bourons every evening to him sleep, which he has stopped drinking. He does still have one or two servings of beer a day. He is having a little more trouble sleeping and sometimes takes an extra Ambien.   Patient also reports having discomfort in his lower back after returning from trip to [place] from Last 3 Encounters:  07/23/16 202 lb (91.6 kg)  06/29/16 201 lb 3.2 oz (91.3 kg)  05/29/16 204 lb (92.5 kg)     Allergies  Allergen Reactions  . Lisinopril Swelling    Tongue swelling  . Myrbetriq [Mirabegron] Swelling    Of the tongue  . Atorvastatin     Dizziness and Fatigue   . Dabigatran Etexilate Mesylate     Stomach ulcers (Pradaxa)     Current Outpatient Prescriptions:  .  atenolol-chlorthalidone (TENORETIC) 50-25 MG tablet, TAKE 1 TABLET DAILY., Disp: 90 tablet, Rfl: 4 .  BIOTIN PO, Take by mouth daily., Disp: , Rfl:  .  calcium-vitamin D  (OSCAL) 250-125 MG-UNIT per tablet, Take by mouth., Disp: , Rfl:  .  Cyanocobalamin (VITAMIN B-12 PO), Take by mouth daily., Disp: , Rfl:  .  Docusate Calcium (STOOL SOFTENER PO), Take by mouth as needed., Disp: , Rfl:  .  EPINEPHrine (EPIPEN 2-PAK) 0.3 mg/0.3 mL IJ SOAJ injection, Inject 0.3 mg into the muscle., Disp: , Rfl:  .  flecainide (TAMBOCOR) 100 MG tablet, Take 1 tablet (100 mg total) by mouth 2 (two) times daily., Disp: 60 tablet, Rfl: 11 .  Leuprolide Acetate, 6 Month, (LUPRON) 45 MG injection, Inject 45 mg into the muscle every 6 (six) months., Disp: , Rfl:  .  omeprazole (PRILOSEC) 40 MG capsule, Take 40 mg by mouth., Disp: , Rfl:  .  rivaroxaban (XARELTO) 20 MG TABS tablet, Take 1 tablet (20 mg total) by mouth daily., Disp: 30 tablet, Rfl: 11 .  sertraline (ZOLOFT) 50 MG tablet, 1/2 tablet for 6 days, then increase to 1 tablet per day, Disp: 30 tablet, Rfl: 3 .  zolpidem (AMBIEN) 10 MG tablet, Take 0.5-1 tablets (5-10 mg total) by mouth at bedtime as needed., Disp: 90 tablet, Rfl: 3  Review of Systems  Constitutional: Negative for appetite change, chills and fever.  Respiratory: Negative for chest tightness, shortness of breath and wheezing.   Cardiovascular: Negative for chest pain and palpitations.  Gastrointestinal: Negative for abdominal pain, nausea and vomiting.  Musculoskeletal: Positive for back pain.    Social History  Substance Use Topics  . Smoking status: Former Smoker    Packs/day: 1.00    Years: 12.00    Quit date: 10/28/1971  . Smokeless tobacco: Former Systems developer    Types: Chew    Quit date: 01/02/2015  . Alcohol use No     Comment: moderate   Objective:   BP 120/60 (BP Location: Left Arm, Patient Position: Sitting, Cuff Size: Large)   Pulse (!) 56   Temp 97.7 F (36.5 C) (Oral)   Resp 16   Ht 5\' 10"  (1.778 m)   Wt 202 lb (91.6 kg)   SpO2 97%   BMI 28.98 kg/m   Physical Exam  General appearance: alert, well developed, well nourished, cooperative  and in no distress Head: Normocephalic, without obvious abnormality, atraumatic Respiratory: Respirations even and unlabored, normal respiratory rate Extremities: No gross deformities MS: Slight tenderness bilateral para-lumbar muscles.     Assessment & Plan:     1. Insomnia Advised not to exceed one 10mg  tablet of ambien per day. May take OTC melatonin with Ambien.  2. Elevated transaminase level Has cut back significantly on alcohol consumption.  - Hepatic function panel  3. Hyperglycemia  - Glucose  4. Need for influenza vaccination  - Flu vaccine HIGH DOSE PF       Lelon Huh, MD  [facility]

## 2016-07-23 NOTE — Patient Instructions (Addendum)
Try taking 2mg  of melatonin with Ambien at bedtime to improve sleep. You may titrate up to 10mg  at bedtime if necessary.

## 2016-08-05 DIAGNOSIS — R740 Nonspecific elevation of levels of transaminase and lactic acid dehydrogenase [LDH]: Secondary | ICD-10-CM | POA: Diagnosis not present

## 2016-08-05 DIAGNOSIS — R739 Hyperglycemia, unspecified: Secondary | ICD-10-CM | POA: Diagnosis not present

## 2016-08-06 LAB — HEPATIC FUNCTION PANEL
ALBUMIN: 4.5 g/dL (ref 3.5–4.8)
ALT: 33 IU/L (ref 0–44)
AST: 42 IU/L — AB (ref 0–40)
Alkaline Phosphatase: 89 IU/L (ref 39–117)
Bilirubin Total: 1.1 mg/dL (ref 0.0–1.2)
Bilirubin, Direct: 0.28 mg/dL (ref 0.00–0.40)
Total Protein: 7 g/dL (ref 6.0–8.5)

## 2016-08-06 LAB — GLUCOSE, RANDOM: Glucose: 140 mg/dL — ABNORMAL HIGH (ref 65–99)

## 2016-08-07 ENCOUNTER — Telehealth: Payer: Self-pay

## 2016-08-07 NOTE — Telephone Encounter (Signed)
Left message to call back  

## 2016-08-07 NOTE — Telephone Encounter (Signed)
Advised patient of results.  

## 2016-08-07 NOTE — Telephone Encounter (Signed)
-----   Message from Birdie Sons, MD sent at 08/06/2016  5:19 PM EDT ----- Liver functions are blood sugar are much better. Liver functions are back to normal. Sugar is borderline elevated. Continue to limit alcohol to no more than 2 servings a day and avoid sweets and starchy foods. Follow up 4 months to check sugar/A1c

## 2016-08-21 ENCOUNTER — Encounter: Payer: Self-pay | Admitting: Cardiovascular Disease

## 2016-08-21 ENCOUNTER — Ambulatory Visit (INDEPENDENT_AMBULATORY_CARE_PROVIDER_SITE_OTHER): Payer: PPO | Admitting: Cardiovascular Disease

## 2016-08-21 VITALS — BP 124/68 | HR 54 | Ht 70.0 in | Wt 194.2 lb

## 2016-08-21 DIAGNOSIS — I700 Atherosclerosis of aorta: Secondary | ICD-10-CM

## 2016-08-21 DIAGNOSIS — Z8679 Personal history of other diseases of the circulatory system: Secondary | ICD-10-CM | POA: Diagnosis not present

## 2016-08-21 DIAGNOSIS — Z720 Tobacco use: Secondary | ICD-10-CM

## 2016-08-21 DIAGNOSIS — E118 Type 2 diabetes mellitus with unspecified complications: Secondary | ICD-10-CM | POA: Diagnosis not present

## 2016-08-21 DIAGNOSIS — I10 Essential (primary) hypertension: Secondary | ICD-10-CM | POA: Diagnosis not present

## 2016-08-21 MED ORDER — RIVAROXABAN 20 MG PO TABS: 20.0000 mg | ORAL_TABLET | Freq: Every day | ORAL | 11 refills | Status: DC

## 2016-08-21 NOTE — Patient Instructions (Addendum)
Medication Instructions:   Try a 1/2 pill of flecainide at night, Whole pill in the AM  Monitor your heart rate  [place] [address] [phone]  Labwork:  No new labs needed  Testing/Procedures:  No further testing at this time   Follow-Up: It was a pleasure seeing you in the office today. Please call us if you have new issues that need to be addressed before your next appt.  [phone]  Your physician wants you to follow-up in: 12 months.  You will receive a reminder letter in the mail two months in advance. If you don't receive a letter, please call our office to schedule the follow-up appointment.  If you need a refill on your cardiac medications before your next appointment, please call your pharmacy.

## 2016-08-21 NOTE — Progress Notes (Signed)
Cardiology Office Note  Date:  08/21/2016   ID:  Ian Sanders, DOB [birth year], MRN [id number]  PCP:  Ian Huh, MD   Chief Complaint  Patient presents with  . other    12 month follow up. Meds reviewed by the pt. verbally. 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 He presents for evaluation of his paroxysmal atrial fibrillation. History of obstructive sleep apnea, does not use CPAP  In follow-up today he reports that he feels well with no complaints Reports that he Eats very strict diet Weight is Down 8 pounds from prior visits  "Not dirnk biourbon/coke" "Does natural light beer" No regular exercise program but does stay active Some gait instability Denies any palpitations concerning for arrhythmia  Has not required a statin in the past, total cholesterol 150 Recent cholesterol numbers reviewed with him  EKG on today's visit shows sinus bradycardia with rate 55 bpm, no significant ST or T-wave changes  Other past medical history Echocardiogram May 2011 showed mildly dilated left atrium, no significant valvular disease, normal ejection fraction. Carotid ultrasound shows small calcified plaque bilaterally with no significant stenosis, May 2011   PMH:   has a past medical history of Atrial fibrillation (Ian Sanders); Benign paroxysmal positional vertigo; History of chicken pox; History of measles; History of mumps; Mononeuritis of unspecified site; Other seborrheic keratosis; Stroke (Ian Sanders); Transient ischemic attack (TIA), and cerebral infarction without residual deficits(V12.54); and Unspecified  hypothyroidism.  PSH:    Past Surgical History:  Procedure Laterality Date  . Carotid Doppler Ultrasound  04/02/2010   Small amount Calcified plaque bilaterally, no significant stenosis.  . COLONOSCOPY  05/11/2014   Tubular Adenoma. Ian Sanders  . DOPPLER ECHOCARDIOGRAPHY  04/02/2010   Mild left atrial dilation. Normal right atrium. No valvular disease. No thrombus. Normal LV function. EF>55%  . FINGER AMPUTATION    . HERNIA REPAIR    . MRI Brain with and without contrast  03/20/2010   Suggestive of basal ganglia lacunar infarct  . PROSTATECTOMY  1995   Abdominal  . SKIN SURGERY     multiple  . Thumb surgery      Current Outpatient Prescriptions  Medication Sig Dispense Refill  . atenolol-chlorthalidone (TENORETIC) 50-25 MG tablet TAKE 1 TABLET DAILY. 90 tablet 4  . BIOTIN PO Take by mouth daily.    . calcium-vitamin D (OSCAL) 250-125 MG-UNIT per tablet Take by mouth.    . Cyanocobalamin (VITAMIN B-12 PO) Take by mouth daily.    Ian Sanders Calcium (STOOL SOFTENER PO) Take by mouth as needed.    Marland Kitchen EPINEPHrine (EPIPEN 2-PAK) 0.3 mg/0.3 mL IJ SOAJ injection Inject 0.3 mg into the muscle.    . flecainide (TAMBOCOR) 100 MG tablet Take 1 tablet (100 mg total) by mouth 2 (two) times daily. 60 tablet 11  . Leuprolide Acetate, 6 Month, (LUPRON) 45 MG injection Inject 45 mg into the muscle every 6 (six) months.    Marland Kitchen omeprazole (PRILOSEC) 40 MG capsule Take 40 mg by mouth.    . rivaroxaban (XARELTO) 20 MG TABS tablet Take 1 tablet (20 mg total) by mouth daily. Ian Sanders  tablet 11  . zolpidem (AMBIEN) 10 MG tablet Take 0.5-1 tablets (5-10 mg total) by mouth at bedtime as needed. 90 tablet 3   No current facility-administered medications for this visit.      Allergies:   Lisinopril; Myrbetriq [mirabegron]; Atorvastatin; Dabigatran etexilate mesylate; and Sertraline   Social History:  The patient  reports that he quit smoking about 44 years ago. He has a 12.00 pack-year smoking history. He quit  smokeless tobacco use about 19 months ago. His smokeless tobacco use included Chew. He reports that he does not drink alcohol or use drugs.   Family History:   family history includes Bladder Cancer in his sister; Cerebral aneurysm in his sister; Heart attack in his mother; Heart disease in his sister; Heart disease (age of onset: 61) in his mother; Hypertension in his brother, brother, and mother; Kidney cancer in his brother and brother; Leukemia in his father; Prostate cancer in his brother; Skin cancer in his brother; Stroke in his father.    Review of Systems: Review of Systems  Constitutional: Negative.   Respiratory: Negative.   Cardiovascular: Negative.   Gastrointestinal: Negative.   Musculoskeletal: Negative.   Neurological: Negative.   Psychiatric/Behavioral: Negative.   All other systems reviewed and are negative.    PHYSICAL EXAM: VS:  BP 124/68 (BP Location: Left Arm, Patient Position: Sitting, Cuff Size: Normal)   Pulse (!) 54   Ht 5\' 10"  (1.778 m)   Wt 194 lb 4 oz (88.1 kg)   BMI 27.87 kg/m  , BMI Body mass index is 27.87 kg/m. GEN: Well nourished, well developed, in no acute distress  HEENT: normal  Neck: no JVD, carotid bruits, or masses Cardiac: RRR; no murmurs, rubs, or gallops,no edema  Respiratory:  clear to auscultation bilaterally, normal work of breathing GI: soft, nontender, nondistended, + BS MS: no deformity or atrophy  Skin: warm and dry, no rash Neuro:  Strength and sensation are intact Psych: euthymic mood, full affect    Recent Labs: 04/17/2016: BUN 9; Creatinine, Ser 1.09; Platelets 106; Potassium 4.0; Sodium 137; TSH 3.260 08/05/2016: ALT 33    Lipid Panel Lab Results  Component Value Date   CHOL 119 04/17/2016   HDL 31 (L) 04/17/2016   LDLCALC 42 04/17/2016   TRIG 228 (H) 04/17/2016      Wt Readings from Last 3 Encounters:  08/21/16 194 lb 4 oz (88.1 kg)  07/23/16 202 lb (91.6 kg)  06/29/16 201 lb 3.2 oz (91.3 kg)        ASSESSMENT AND PLAN:   Atrial fibrillation, currently in sinus rhythm - Plan: EKG 12-Lead Maintaining normal sinus rhythm. We'll continue atenolol, anticoagulation, flecainide Long discussion concerning his flecainide dosing and other antiarrhythmic medications. After long discussion, he would like to decrease the dose of his flecainide. He will try 100 mg in the morning, 50 mg in the evening with possible titration down to 50 mg twice a day if tolerated. If he has recurrent arrhythmia, he will increase the dose back to 100 twice a day  Essential hypertension - Plan: EKG 12-Lead He reports having drop in his blood pressure with weight loss No medication changes made today, recommended he monitor his blood pressure at home  Type 2 diabetes mellitus with complication, without long-term current use of insulin ([place]) Reports he is eating better, sugars improved Numbers reviewed with him  Tobacco abuse We have encouraged him to continue to work on weaning his cigarettes and smoking cessation. He will continue to  work on this and does not want any assistance with chantix.    Total encounter time more than 25 minutes  Greater than 50% was spent in counseling and coordination of care with the patient   Disposition:   F/U  6 months   Orders Placed This Encounter  Procedures  . EKG 12-Lead     Signed, Esmond Plants, M.D., Ph.D. 08/21/2016  [place], [place]

## 2016-08-26 ENCOUNTER — Other Ambulatory Visit: Payer: Self-pay | Admitting: Family Medicine

## 2016-09-18 ENCOUNTER — Ambulatory Visit: Payer: PPO | Admitting: Family Medicine

## 2016-10-26 ENCOUNTER — Telehealth: Payer: Self-pay | Admitting: Cardiovascular Disease

## 2016-10-26 NOTE — Telephone Encounter (Signed)
Pt c/o of Chest Pain: STAT if CP now or developed within 24 hours  1. Are you having CP right now?  No   2. Are you experiencing any other symptoms (ex. SOB, nausea, vomiting, sweating)? No   3. How long have you been experiencing CP?  Several days   4. Is your CP continuous or coming and going?  Comes and goes   5. Have you taken Nitroglycerin? ?no    Made an appt with Dr. Rockey Situ on Friday 10-30-16 at 1120 am   Alternate number : [phone]

## 2016-10-26 NOTE — Telephone Encounter (Signed)
Spoke w/ pt.  He reports chest pain recently, describes it as a muscle pain on the outside of his ribs, tender to the touch in one spot. Pt is unsure if he pulled a muscle, but states that it is likely. Sx present when pt is sitting and resolve on their own.  He denies SOB, n/v or sweating. Advised pt to keep his appt on Friday and proceed to the ED if his sx become emergent.  He is appreciative and will call back w/ any further questions or concerns.

## 2016-10-30 ENCOUNTER — Ambulatory Visit (INDEPENDENT_AMBULATORY_CARE_PROVIDER_SITE_OTHER): Payer: PPO | Admitting: Cardiovascular Disease

## 2016-10-30 ENCOUNTER — Encounter: Payer: Self-pay | Admitting: Cardiovascular Disease

## 2016-10-30 VITALS — BP 110/60 | HR 58 | Ht 70.0 in | Wt 187.0 lb

## 2016-10-30 DIAGNOSIS — R079 Chest pain, unspecified: Secondary | ICD-10-CM | POA: Diagnosis not present

## 2016-10-30 DIAGNOSIS — I10 Essential (primary) hypertension: Secondary | ICD-10-CM

## 2016-10-30 DIAGNOSIS — I700 Atherosclerosis of aorta: Secondary | ICD-10-CM

## 2016-10-30 DIAGNOSIS — R634 Abnormal weight loss: Secondary | ICD-10-CM | POA: Insufficient documentation

## 2016-10-30 DIAGNOSIS — Z8679 Personal history of other diseases of the circulatory system: Secondary | ICD-10-CM

## 2016-10-30 DIAGNOSIS — Z8673 Personal history of transient ischemic attack (TIA), and cerebral infarction without residual deficits: Secondary | ICD-10-CM

## 2016-10-30 DIAGNOSIS — Z7189 Other specified counseling: Secondary | ICD-10-CM | POA: Insufficient documentation

## 2016-10-30 MED ORDER — FLECAINIDE ACETATE 50 MG PO TABS: 50.0000 mg | ORAL_TABLET | Freq: Two times a day (BID) | ORAL | 3 refills | Status: DC

## 2016-10-30 NOTE — Progress Notes (Signed)
Cardiology Office Note  Date:  10/30/2016   ID:  Ian Sanders, DOB [birth year], MRN [id number]  PCP:  Lelon Huh, MD   Chief Complaint  Patient presents with  . other    Pt. c/o chest pain for about 1 week. Meds reviewed by the pt. verbally.     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 Caryn Section. He presents for evaluation of his paroxysmal atrial fibrillation. History of obstructive sleep apnea, does not use CPAP  In follow-up today he reports that he is doing well He decrease the dose of flecainide now down to 50 mg twice a day based on discussions on his last clinic visit.  reports he is compliant with his anticoagulation  Denies any palpitations or tachycardia concerning for atrial fibrillation  Otherwise feels well    Eats very strict diet Weight is Down 7 pounds from prior visits Significant weight loss over the past year   "Does natural light beer", very little No regular exercise program but does stay active Some gait instability  Has not required a statin in the past, total cholesterol 150  EKG on today's visit shows sinus bradycardia with rate 58 bpm, no significant ST or T-wave changes  Other past medical history Echocardiogram May 2011 showed mildly dilated left atrium, no significant valvular disease, normal ejection fraction. Carotid ultrasound shows small calcified plaque bilaterally with no significant stenosis, May 2011  PMH:   has a past medical history of Atrial fibrillation (Ian Sanders); Benign paroxysmal positional vertigo; History of chicken pox; History of measles; History of mumps; Mononeuritis of unspecified  site; Other seborrheic keratosis; Stroke ([place]); Transient ischemic attack (TIA), and cerebral infarction without residual deficits(V12.54); and Unspecified hypothyroidism.  PSH:    Past Surgical History:  Procedure Laterality Date  . Carotid Doppler Ultrasound  04/02/2010   Small amount Calcified plaque bilaterally, no significant stenosis.  . COLONOSCOPY  05/11/2014   Tubular Adenoma. Dr. Allen Norris  . DOPPLER ECHOCARDIOGRAPHY  04/02/2010   Mild left atrial dilation. Normal right atrium. No valvular disease. No thrombus. Normal LV function. EF>55%  . FINGER AMPUTATION    . HERNIA REPAIR    . MRI Brain with and without contrast  03/20/2010   Suggestive of basal ganglia lacunar infarct  . PROSTATECTOMY  1995   Abdominal  . SKIN SURGERY     multiple  . Thumb surgery      Current Outpatient Prescriptions  Medication Sig Dispense Refill  . atenolol-chlorthalidone (TENORETIC) 50-25 MG tablet TAKE 1 TABLET DAILY. 90 tablet 4  . BIOTIN PO Take by mouth daily.    . calcium-vitamin D (OSCAL) 250-125 MG-UNIT per tablet Take by mouth.    . Cyanocobalamin (VITAMIN B-12 PO) Take by mouth daily.    Mariane Baumgarten Calcium (STOOL SOFTENER PO) Take by mouth as needed.    Marland Kitchen EPINEPHrine (EPIPEN 2-PAK) 0.3 mg/0.3 mL IJ SOAJ injection Inject 0.3 mg into the muscle.    . flecainide (TAMBOCOR) 50 MG tablet Take 1 tablet (50 mg total) by mouth 2 (two) times daily. 180 tablet 3  . Leuprolide Acetate, 6 Month, (LUPRON) 45 MG injection Inject 45 mg into the muscle every  6 (six) months.    Marland Kitchen omeprazole (PRILOSEC) 40 MG capsule TAKE ONE CAPSULE DAILY. 90 capsule 4  . rivaroxaban (XARELTO) 20 MG TABS tablet Take 1 tablet (20 mg total) by mouth daily. 30 tablet 11  . zolpidem (AMBIEN) 10 MG tablet Take 0.5-1 tablets (5-10 mg total) by mouth at bedtime as needed. 90 tablet 3   No current facility-administered medications for this visit.      Allergies:   Lisinopril; Myrbetriq [mirabegron]; Atorvastatin; Dabigatran  etexilate mesylate; and Sertraline   Social History:  The patient  reports that he quit smoking about 45 years ago. He has a 12.00 pack-year smoking history. He quit smokeless tobacco use about 21 months ago. His smokeless tobacco use included Chew. He reports that he does not drink alcohol or use drugs.   Family History:   family history includes Bladder Cancer in his sister; Cerebral aneurysm in his sister; Heart attack in his mother; Heart disease in his sister; Heart disease (age of onset: 24) in his mother; Hypertension in his brother, brother, and mother; Kidney cancer in his brother and brother; Leukemia in his father; Prostate cancer in his brother; Skin cancer in his brother; Stroke in his father.    Review of Systems: Review of Systems  Constitutional: Negative.   Respiratory: Negative.   Cardiovascular: Negative.   Gastrointestinal: Negative.   Musculoskeletal: Negative.   Neurological: Negative.   Psychiatric/Behavioral: Negative.   All other systems reviewed and are negative.    PHYSICAL EXAM: VS:  BP 110/60 (BP Location: Left Arm, Patient Position: Sitting, Cuff Size: Normal)   Pulse (!) 58   Ht 5\' 10"  (1.778 m)   Wt 187 lb (84.8 kg)   BMI 26.83 kg/m  , BMI Body mass index is 26.83 kg/m. GEN: Well nourished, well developed, in no acute distress  HEENT: normal  Neck: no JVD, carotid bruits, or masses Cardiac: RRR; no murmurs, rubs, or gallops,no edema  Respiratory:  clear to auscultation bilaterally, normal work of breathing GI: soft, nontender, nondistended, + BS MS: no deformity or atrophy  Skin: warm and dry, no rash Neuro:  Strength and sensation are intact Psych: euthymic mood, full affect    Recent Labs: 04/17/2016: BUN 9; Creatinine, Ser 1.09; Platelets 106; Potassium 4.0; Sodium 137; TSH 3.260 08/05/2016: ALT 33    Lipid Panel Lab Results  Component Value Date   CHOL 119 04/17/2016   HDL 31 (L) 04/17/2016   LDLCALC 42 04/17/2016   TRIG 228 (H)  04/17/2016      Wt Readings from Last 3 Encounters:  10/30/16 187 lb (84.8 kg)  08/21/16 194 lb 4 oz (88.1 kg)  07/23/16 202 lb (91.6 kg)       ASSESSMENT AND PLAN:  Chest pain, unspecified type - Plan: EKG 12-Lead Denies any symptoms concerning for angina No further testing  Weight loss Very strict diet, rate and 10 pound weight loss over the past year  Aortic atherosclerosis (HCC) Cholesterol is at goal on the current lipid regimen. No changes to the medications were made.  Atrial fibrillation, currently in sinus rhythm Encouraged him to stay on  flecainide 50 mg twice a day with anticoagulation Previously was on higher dose, was maintaining normal sinus rhythm, he wanted to try lower dose  Essential hypertension Blood pressure is well controlled on today's visit. No changes made to the medications. Blood pressure lower secondary to weight loss If he has continued weight loss, will need to decrease his atenolol chlorthalidone dose  History of CVA (cerebrovascular accident) Stable, had good recovery, on anticoagulation  Encounter for anticoagulation discussion and counseling Encouraged him to stay on his xarelto daily given history of stroke   Total encounter time more than 25 minutes  Greater than 50% was spent in counseling and coordination of care with the patient   Disposition:   F/U  6 months   Orders Placed This Encounter  Procedures  . EKG 12-Lead     Signed, Esmond Plants, M.D., Ph.D. 10/30/2016  [place], [place]

## 2016-10-30 NOTE — Patient Instructions (Signed)

## 2016-12-22 ENCOUNTER — Other Ambulatory Visit: Payer: Self-pay

## 2016-12-22 DIAGNOSIS — C61 Malignant neoplasm of prostate: Secondary | ICD-10-CM

## 2016-12-23 ENCOUNTER — Other Ambulatory Visit: Payer: PPO

## 2016-12-23 DIAGNOSIS — C61 Malignant neoplasm of prostate: Secondary | ICD-10-CM

## 2016-12-24 LAB — PSA: Prostate Specific Ag, Serum: <0.1 ng/mL (ref 0.0–4.0)

## 2016-12-29 ENCOUNTER — Telehealth: Payer: Self-pay | Admitting: Cardiovascular Disease

## 2016-12-29 NOTE — Telephone Encounter (Signed)
Received cardiac clearance request for pt to proceed w/ extraction of multiple (illegible) teeth under general anesthesia.   DOS has not been scheduled pending this clearance. "Can this patient hold his Xarelto pre-operatively?  Preferably 5 days." Please route clearance and recommendation to [place] @ [phone].

## 2016-12-29 NOTE — Telephone Encounter (Signed)
Acceptable risk for the surgery, okay to stop anticoagulation Would leave it up to the dentist but would try to minimize time off the xarelto Would restart the anticoagulation when appropriate after the surgery

## 2016-12-30 ENCOUNTER — Encounter: Payer: Self-pay | Admitting: Urology

## 2016-12-30 ENCOUNTER — Ambulatory Visit: Payer: PPO | Admitting: Urology

## 2016-12-30 VITALS — BP 144/70 | HR 60 | Ht 70.0 in | Wt 189.9 lb

## 2016-12-30 DIAGNOSIS — N3941 Urge incontinence: Secondary | ICD-10-CM

## 2016-12-30 DIAGNOSIS — R350 Frequency of micturition: Secondary | ICD-10-CM

## 2016-12-30 DIAGNOSIS — Z8546 Personal history of malignant neoplasm of prostate: Secondary | ICD-10-CM | POA: Diagnosis not present

## 2016-12-30 LAB — BLADDER SCAN AMB NON-IMAGING: SCAN RESULT: 145

## 2016-12-30 MED ORDER — LEUPROLIDE ACETATE (6 MONTH) 45 MG IM KIT
45.0000 mg | PACK | Freq: Once | INTRAMUSCULAR | Status: AC
  Administered 2016-12-30: 45 mg via INTRAMUSCULAR

## 2016-12-30 NOTE — Telephone Encounter (Signed)
Routed to fax # provided. 

## 2016-12-30 NOTE — Progress Notes (Signed)
12/30/2016 3:00 PM   Ian Sanders [birth year] [id number]  Referring provider: Birdie Sons, MD [address] [place], [place] [address]  Chief Complaint  Patient presents with  . Prostate Cancer    6 month follow up    HPI: Ian Sanders is a 76 year old Caucasian with prostate cancer and incontinence who presents today for his 6 months Lupron.  Prostate cancer: Patient underwent RRP by Dr. Domingo Cocking at [facility] in 1995. His PSA remained below 1 until 2014 when it rose to 9.8 ng/mL. He was started on ADT with [place] in 03/15. He has received [place] for 4 months and then was switched to a longer acting agent, Lupron. He is having hot flashes, but he finds them manageable. He is taking 600mg  of calcium twice daily and vitamin d 400mg  twice daily. He reports a good appetite, no weight loss and no back pain. He has not had any recent fevers, chills, nausea and/or vomiting.  His last PSA was <0.1 ng/mL on 12/24/2016.  He is no longer experiencing hot flashes.    Incontinence: Patient's urinary symptoms are stable.  He has not had dysuria, fevers, chills, nausea or vomiting. He also denies suprapubic pain. He was seeing some improvement with Myrbetriq, but he experienced tongue swelling and discontinued the medication.  His current IPSS score is 16/5.  His previous IPSS score today was 17/4.  He states that the frequency has become more and more bothersome as of late.       IPSS    Row Name 12/30/16 1400         International Prostate Symptom Score   How often have you had the sensation of not emptying your bladder? Less than 1 in 5     How often have you had to urinate less than every two hours? Less than half the time     How often have you found you stopped and started again several times when you urinated? Less than 1 in 5 times     How often have you found it difficult to postpone urination? Almost always     How often have you had a weak urinary stream? Almost  always     How often have you had to strain to start urination? Not at All     How many times did you typically get up at night to urinate? 2 Times     Total IPSS Score 16       Quality of Life due to urinary symptoms   If you were to spend the rest of your life with your urinary condition just the way it is now how would you feel about that? Unhappy        Score:  1-7 Mild 8-19 Moderate 20-35 Severe    PMH: Past Medical History:  Diagnosis Date  . Atrial fibrillation ([place])   . Benign paroxysmal positional vertigo   . History of chicken pox   . History of measles   . History of mumps   . Mononeuritis of unspecified site   . Other seborrheic keratosis   . Stroke ([place])   . 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 Unspecified hypothyroidism     Surgical History: Past Surgical History:  Procedure Laterality Date  . Carotid Doppler Ultrasound  04/02/2010   Small amount Calcified plaque bilaterally, no significant stenosis.  . COLONOSCOPY  05/11/2014   Tubular Adenoma. Dr. Allen Norris  .  DOPPLER ECHOCARDIOGRAPHY  04/02/2010   Mild left atrial dilation. Normal right atrium. No valvular disease. No thrombus. Normal LV function. EF>55%  . FINGER AMPUTATION    . HERNIA REPAIR    . MRI Brain with and without contrast  03/20/2010   Suggestive of basal ganglia lacunar infarct  . PROSTATECTOMY  1995   Abdominal  . SKIN SURGERY     multiple  . Thumb surgery      Home Medications:  Allergies as of 12/30/2016      Reactions   Lisinopril Swelling   Tongue swelling   Myrbetriq [mirabegron] Swelling   Of the tongue   Atorvastatin    Dizziness and Fatigue   Dabigatran Etexilate Mesylate    Stomach ulcers (Pradaxa)   Sertraline    Tongue swelling      Medication List       Accurate as of 12/30/16  3:00 PM. Always use your most recent med list.          amoxicillin 250 MG capsule Commonly known as:  AMOXIL Take 250 mg  by mouth 3 (three) times daily. To prepare for dental surgery   atenolol-chlorthalidone 50-25 MG tablet Commonly known as:  TENORETIC TAKE 1 TABLET DAILY.   BIOTIN PO Take by mouth daily.   calcium-vitamin D 250-125 MG-UNIT tablet Commonly known as:  OSCAL Take by mouth.   EPIPEN 2-PAK 0.3 mg/0.3 mL Soaj injection Generic drug:  EPINEPHrine Inject 0.3 mg into the muscle.   flecainide 50 MG tablet Commonly known as:  TAMBOCOR Take 1 tablet (50 mg total) by mouth 2 (two) times daily.   HYDROcodone-acetaminophen 5-325 MG tablet Commonly known as:  NORCO/VICODIN Take 1 tablet by mouth every 6 (six) hours as needed for moderate pain.   Leuprolide Acetate (6 Month) 45 MG injection Commonly known as:  LUPRON Inject 45 mg into the muscle every 6 (six) months.   omeprazole 40 MG capsule Commonly known as:  PRILOSEC TAKE ONE CAPSULE DAILY.   rivaroxaban 20 MG Tabs tablet Commonly known as:  XARELTO Take 1 tablet (20 mg total) by mouth daily.   STOOL SOFTENER PO Take by mouth as needed.   VITAMIN B-12 PO Take by mouth daily.   zolpidem 10 MG tablet Commonly known as:  AMBIEN Take 0.5-1 tablets (5-10 mg total) by mouth at bedtime as needed.       Allergies:  Allergies  Allergen Reactions  . Lisinopril Swelling    Tongue swelling  . Myrbetriq [Mirabegron] Swelling    Of the tongue  . Atorvastatin     Dizziness and Fatigue   . Dabigatran Etexilate Mesylate     Stomach ulcers (Pradaxa)  . Sertraline     Tongue swelling    Family History: Family History  Problem Relation Age of Onset  . Heart disease Mother 61    CABG  . Hypertension Mother   . Heart attack Mother   . Hypertension Brother   . Heart disease Sister     stents  . Prostate cancer Brother   . Kidney cancer Brother   . Skin cancer Brother   . Hypertension Brother   . Bladder Cancer Sister   . Leukemia Father   . Stroke Father     multiple  . Cerebral aneurysm Sister   . Kidney cancer  Brother     Social History:  reports that he quit smoking about 45 years ago. He has a 12.00 pack-year smoking history. He quit smokeless tobacco use  about 1 years ago. His smokeless tobacco use included Chew. He reports that he drinks about 8.4 oz of alcohol per week . He reports that he does not use drugs.  ROS: UROLOGY Frequent Urination?: Yes Hard to postpone urination?: Yes Burning/pain with urination?: No Get up at night to urinate?: Yes Leakage of urine?: Yes Urine stream starts and stops?: No Trouble starting stream?: No Do you have to strain to urinate?: No Blood in urine?: No Urinary tract infection?: No Sexually transmitted disease?: No Injury to kidneys or bladder?: No Painful intercourse?: No Weak stream?: No Erection problems?: No Penile pain?: No  Gastrointestinal Nausea?: No Vomiting?: No Indigestion/heartburn?: No Diarrhea?: No Constipation?: Yes  Constitutional Fever: No Night sweats?: No Weight loss?: No Fatigue?: No  Skin Skin rash/lesions?: No Itching?: No  Eyes Blurred vision?: No Double vision?: No  Ears/Nose/Throat Sore throat?: No Sinus problems?: No  Hematologic/Lymphatic Swollen glands?: No Easy bruising?: No  Cardiovascular Leg swelling?: No Chest pain?: No  Respiratory Cough?: No Shortness of breath?: No  Endocrine Excessive thirst?: No  Musculoskeletal Back pain?: No Joint pain?: No  Neurological Headaches?: No Dizziness?: No  Psychologic Depression?: No Anxiety?: No  Physical Exam: BP (!) 144/70   Pulse 60   Ht 5\' 10"  (1.778 m)   Wt 189 lb 14.4 oz (86.1 kg)   BMI 27.25 kg/m   Constitutional: Well nourished. Alert and oriented, No acute distress. HEENT: [place] AT, moist mucus membranes. Trachea midline, no masses. Cardiovascular: No clubbing, cyanosis, or edema. Respiratory: Normal respiratory effort, no increased work of breathing. GI: Abdomen is soft, non tender, non distended, no abdominal masses.  Liver and spleen not palpable.  No hernias appreciated.  Stool sample for occult testing is not indicated.   GU: No CVA tenderness.  No bladder fullness or masses.  Patient with uncircumcised phallus. Foreskin easily retracted Urethral meatus is patent.  No penile discharge. No penile lesions or rashes. Scrotum without lesions, cysts, rashes and/or edema.  Testicles are located scrotally bilaterally. No masses are appreciated in the testicles. Left and right epididymis are normal. Rectal: Patient with  normal sphincter tone. Anus and perineum without scarring or rashes. No rectal masses are appreciated. Prostate and seminal vesicles are surgically absent.  No rectal masses.    Skin: No rashes, bruises or suspicious lesions. Lymph: No cervical or inguinal adenopathy. Neurologic: Grossly intact, no focal deficits, moving all 4 extremities. Psychiatric: Normal mood and affect.  Laboratory Data: Lab Results  Component Value Date   WBC 5.4 04/17/2016   HGB 12.8 (A) 02/28/2015   HCT 40.8 04/17/2016   MCV 86 04/17/2016   PLT 106 (L) 04/17/2016    Lab Results  Component Value Date   CREATININE 1.09 04/17/2016   Lab Results  Component Value Date   HGBA1C 6.3 (H) 05/21/2016      Component Value Date/Time   CHOL 119 04/17/2016 1119   HDL 31 (L) 04/17/2016 1119   CHOLHDL 3.8 04/17/2016 1119   LDLCALC 42 04/17/2016 1119    Lab Results  Component Value Date   AST 42 (H) 08/05/2016   Lab Results  Component Value Date   ALT 33 08/05/2016    Pertinent Imaging: Results for TAKEO, OKEREKE (MRN [id number]) as of 12/30/2016 14:46  Ref. Range 12/30/2016 14:45  Scan Result Unknown 145     Assessment & Plan:    1. Prostate cancer:   Patient underwent RRP in 1995. His PSA remained below 1 until 2014 when it rose to  9.8 ng/mL. He was started on ADT and his PSA returned to <0.1ng/mL. He will RTC in 6 months for lupron, PSA and exam.    - Leuprolide Acetate (6 Month) (LUPRON) injection 45  mg; Inject 45 mg into the muscle once.  2. Incontinence: Patient experienced tongue swelling while on the Myrbetriq, so he discontinued the medication.  He is not bothered by the incontinence at this time.  3. Frequency  - patient given Toviaz 4 mg daily  - RTC in 3 weeks for I PSS and PVR  Return in about 3 weeks (around 01/20/2017) for I PSS and PVR.  These notes generated with voice recognition software. I apologize for typographical errors.  Zara Council, [facility] [address] [place], [place] [address] [phone]

## 2016-12-31 ENCOUNTER — Telehealth: Payer: Self-pay | Admitting: Cardiovascular Disease

## 2016-12-31 NOTE — Telephone Encounter (Signed)
Patient states office did not receive clearance .  Please resend.

## 2016-12-31 NOTE — Telephone Encounter (Signed)
Error

## 2016-12-31 NOTE — Telephone Encounter (Signed)
Confirmed fax # and sent to [phone].

## 2017-01-01 ENCOUNTER — Encounter: Payer: Self-pay | Admitting: Family Medicine

## 2017-01-01 ENCOUNTER — Ambulatory Visit (INDEPENDENT_AMBULATORY_CARE_PROVIDER_SITE_OTHER): Payer: PPO | Admitting: Family Medicine

## 2017-01-01 VITALS — BP 134/60 | HR 68 | Temp 99.7°F | Resp 16 | Wt 194.0 lb

## 2017-01-01 DIAGNOSIS — S60051A Contusion of right little finger without damage to nail, initial encounter: Secondary | ICD-10-CM

## 2017-01-01 NOTE — Progress Notes (Signed)
Patient: Ian Sanders Male    DOB: 1940/12/10   76 y.o.   MRN: [id number] Visit Date: 01/01/2017  Today's Provider: Lelon Huh, MD   Chief Complaint  Patient presents with  . Hand Injury   Subjective:    Hand Injury   Incident onset: yesterday. The incident occurred at home. Injury mechanism: smashed his pinky finger in the tailgait of his truck. The pain is present in the right hand (pinky finger). Quality: throbbing. The pain does not radiate. Pertinent negatives include no chest pain, muscle weakness, numbness or tingling.       Allergies  Allergen Reactions  . Lisinopril Swelling    Tongue swelling  . Myrbetriq [Mirabegron] Swelling    Of the tongue  . Atorvastatin     Dizziness and Fatigue   . Dabigatran Etexilate Mesylate     Stomach ulcers (Pradaxa)  . Sertraline     Tongue swelling     Current Outpatient Prescriptions:  .  amoxicillin (AMOXIL) 250 MG capsule, Take 250 mg by mouth 3 (three) times daily. To prepare for dental surgery, Disp: , Rfl:  .  atenolol-chlorthalidone (TENORETIC) 50-25 MG tablet, TAKE 1 TABLET DAILY., Disp: 90 tablet, Rfl: 4 .  BIOTIN PO, Take by mouth daily., Disp: , Rfl:  .  calcium-vitamin D (OSCAL) 250-125 MG-UNIT per tablet, Take by mouth., Disp: , Rfl:  .  Cyanocobalamin (VITAMIN B-12 PO), Take by mouth daily., Disp: , Rfl:  .  Docusate Calcium (STOOL SOFTENER PO), Take by mouth as needed., Disp: , Rfl:  .  EPINEPHrine (EPIPEN 2-PAK) 0.3 mg/0.3 mL IJ SOAJ injection, Inject 0.3 mg into the muscle., Disp: , Rfl:  .  fesoterodine (TOVIAZ) 4 MG TB24 tablet, Take 4 mg by mouth daily., Disp: , Rfl:  .  flecainide (TAMBOCOR) 50 MG tablet, Take 1 tablet (50 mg total) by mouth 2 (two) times daily., Disp: 180 tablet, Rfl: 3 .  HYDROcodone-acetaminophen (NORCO/VICODIN) 5-325 MG tablet, Take 1 tablet by mouth every 6 (six) hours as needed for moderate pain., Disp: , Rfl:  .  Leuprolide Acetate, 6 Month, (LUPRON) 45 MG injection,  Inject 45 mg into the muscle every 6 (six) months., Disp: , Rfl:  .  omeprazole (PRILOSEC) 40 MG capsule, TAKE ONE CAPSULE DAILY., Disp: 90 capsule, Rfl: 4 .  rivaroxaban (XARELTO) 20 MG TABS tablet, Take 1 tablet (20 mg total) by mouth daily., Disp: 30 tablet, Rfl: 11 .  zolpidem (AMBIEN) 10 MG tablet, Take 0.5-1 tablets (5-10 mg total) by mouth at bedtime as needed., Disp: 90 tablet, Rfl: 3  Review of Systems  Constitutional: Negative for appetite change, chills and fever.  Respiratory: Negative for chest tightness, shortness of breath and wheezing.   Cardiovascular: Negative for chest pain and palpitations.  Gastrointestinal: Negative for abdominal pain, nausea and vomiting.  Skin: Positive for color change and wound.  Neurological: Negative for tingling and numbness.    Social History  Substance Use Topics  . Smoking status: Former Smoker    Packs/day: 1.00    Years: 12.00    Quit date: 10/28/1971  . Smokeless tobacco: Former Systems developer    Types: Chew    Quit date: 01/02/2015  . Alcohol use 8.4 oz/week    14 Standard drinks or equivalent per week     Comment: moderate  /patient states occasional   Objective:   BP 134/60 (BP Location: Left Arm, Patient Position: Sitting, Cuff Size: Large)   Pulse 68  Temp 99.7 F (37.6 C) (Oral)   Resp 16   Wt 194 lb (88 kg)   SpO2 99% Comment: room air  BMI 27.84 kg/m   Physical Exam  Purple discoloration under right 5th fingernail. Minimal swelling of tip of finger. No erythema.     Assessment & Plan:     1. Contusion of right little finger without damage to nail, initial encounter Continue to apply ice every 3-4 hours for the next day, then expect resolution over the next 2-3 weeks.      The entirety of the information documented in the History of Present Illness, Review of Systems and Physical Exam were personally obtained by me. Portions of this information were initially documented by Meyer Cory, CMA and reviewed by me for  thoroughness and accuracy.    Lelon Huh, MD  [facility]

## 2017-01-12 ENCOUNTER — Encounter: Payer: Self-pay | Admitting: Family Medicine

## 2017-01-20 ENCOUNTER — Ambulatory Visit (INDEPENDENT_AMBULATORY_CARE_PROVIDER_SITE_OTHER): Payer: PPO | Admitting: Family Medicine

## 2017-01-20 ENCOUNTER — Ambulatory Visit (INDEPENDENT_AMBULATORY_CARE_PROVIDER_SITE_OTHER): Payer: PPO | Admitting: Urology

## 2017-01-20 ENCOUNTER — Encounter: Payer: Self-pay | Admitting: Family Medicine

## 2017-01-20 ENCOUNTER — Encounter: Payer: Self-pay | Admitting: Urology

## 2017-01-20 ENCOUNTER — Other Ambulatory Visit: Payer: Self-pay

## 2017-01-20 VITALS — BP 137/69 | HR 57 | Ht 70.0 in | Wt 187.0 lb

## 2017-01-20 DIAGNOSIS — F330 Major depressive disorder, recurrent, mild: Secondary | ICD-10-CM | POA: Diagnosis not present

## 2017-01-20 DIAGNOSIS — N3941 Urge incontinence: Secondary | ICD-10-CM

## 2017-01-20 DIAGNOSIS — R350 Frequency of micturition: Secondary | ICD-10-CM | POA: Diagnosis not present

## 2017-01-20 LAB — BLADDER SCAN AMB NON-IMAGING: Scan Result: 0

## 2017-01-20 MED ORDER — SERTRALINE HCL 50 MG PO TABS: ORAL_TABLET | ORAL | 2 refills | Status: DC

## 2017-01-20 MED ORDER — ZOLPIDEM TARTRATE 10 MG PO TABS: 5.0000 mg | ORAL_TABLET | Freq: Every evening | ORAL | 1 refills | Status: DC | PRN

## 2017-01-20 MED ORDER — FESOTERODINE FUMARATE ER 4 MG PO TB24: 4.0000 mg | ORAL_TABLET | Freq: Every day | ORAL | 11 refills | Status: DC

## 2017-01-20 NOTE — Progress Notes (Signed)
01/20/2017 2:59 PM   Ian Sanders 01/19/1941 [id number]  Referring provider: Birdie Sons, MD [address] [place],  [address]  No chief complaint on file.   HPI: Ian Sanders is a 76 year old Caucasian with prostate cancer and incontinence who presents today for his 6 months Lupron.  Prostate cancer: Patient underwent RRP by Dr. Domingo Sanders at [facility] in 1995. His PSA remained below 1 until 2014 when it rose to 9.8 ng/mL. He was started on ADT with [place] in 03/15. He has received [place] for 4 months and then was switched to a longer acting agent, Lupron. He is having hot flashes, but he finds them manageable. He is taking 600mg  of calcium twice daily and vitamin d 400mg  twice daily. He reports a good appetite, no weight loss and no back pain. He has not had any recent fevers, chills, nausea and/or vomiting.  His last PSA was <0.1 ng/mL on 12/24/2016.  He is no longer experiencing hot flashes.    Incontinence: Patient's urinary symptoms are stable.  He has not had dysuria, fevers, chills, nausea or vomiting. He also denies suprapubic pain. He was seeing some improvement with Myrbetriq, but he experienced tongue swelling and discontinued the medication.  His current IPSS score is 12/3.  His previous IPSS score was 16/5.  He states that the frequency has become more and more bothersome as of late.  His PVR was 0 mL.       IPSS    Row Name 12/30/16 1400 01/20/17 1400       International Prostate Symptom Score   How often have you had the sensation of not emptying your bladder? Less than 1 in 5 About half the time    How often have you had to urinate less than every two hours? Less than half the time Not at All    How often have you found you stopped and started again several times when you urinated? Less than 1 in 5 times About half the time    How often have you found it difficult to postpone urination? Almost always Less than 1 in 5 times    How often have you  had a weak urinary stream? Almost always More than half the time    How often have you had to strain to start urination? Not at All Not at All    How many times did you typically get up at night to urinate? 2 Times 1 Time    Total IPSS Score 16 12      Quality of Life due to urinary symptoms   If you were to spend the rest of your life with your urinary condition just the way it is now how would you feel about that? Unhappy Mixed       Score:  1-7 Mild 8-19 Moderate 20-35 Severe    PMH: Past Medical History:  Diagnosis Date  . Atrial fibrillation ([place])   . Benign paroxysmal positional vertigo   . History of chicken pox   . History of measles   . History of mumps   . Mononeuritis of unspecified site   . Other seborrheic keratosis   . Stroke ([place])   . 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 Unspecified hypothyroidism     Surgical History: Past Surgical History:  Procedure Laterality Date  . Carotid Doppler Ultrasound  04/02/2010   Small amount Calcified plaque bilaterally, no significant stenosis.  [facility] Kitchen  COLONOSCOPY  05/11/2014   Tubular Adenoma. Dr. Allen Sanders  . DOPPLER ECHOCARDIOGRAPHY  04/02/2010   Mild left atrial dilation. Normal right atrium. No valvular disease. No thrombus. Normal LV function. EF>55%  . FINGER AMPUTATION    . HERNIA REPAIR    . MRI Brain with and without contrast  03/20/2010   Suggestive of basal ganglia lacunar infarct  . PROSTATECTOMY  1995   Abdominal  . SKIN SURGERY     multiple  . Thumb surgery      Home Medications:  Allergies as of 01/20/2017      Reactions   Lisinopril Swelling   Tongue swelling   Myrbetriq [mirabegron] Swelling   Of the tongue   Atorvastatin    Dizziness and Fatigue   Dabigatran Etexilate Mesylate    Stomach ulcers (Pradaxa)   Sertraline    Tongue swelling      Medication List       Accurate as of 01/20/17  2:59 PM. Always use your most recent med list.            amoxicillin 250 MG capsule Commonly known as:  AMOXIL Take 250 mg by mouth 3 (three) times daily. To prepare for dental surgery   atenolol-chlorthalidone 50-25 MG tablet Commonly known as:  TENORETIC TAKE 1 TABLET DAILY.   BIOTIN PO Take by mouth daily.   calcium-vitamin D 250-125 MG-UNIT tablet Commonly known as:  OSCAL Take by mouth.   EPIPEN 2-PAK 0.3 mg/0.3 mL Soaj injection Generic drug:  EPINEPHrine Inject 0.3 mg into the muscle.   fesoterodine 4 MG Tb24 tablet Commonly known as:  TOVIAZ Take 1 tablet (4 mg total) by mouth daily.   flecainide 50 MG tablet Commonly known as:  TAMBOCOR Take 1 tablet (50 mg total) by mouth 2 (two) times daily.   HYDROcodone-acetaminophen 5-325 MG tablet Commonly known as:  NORCO/VICODIN Take 1 tablet by mouth every 6 (six) hours as needed for moderate pain.   Leuprolide Acetate (6 Month) 45 MG injection Commonly known as:  LUPRON Inject 45 mg into the muscle every 6 (six) months.   omeprazole 40 MG capsule Commonly known as:  PRILOSEC TAKE ONE CAPSULE DAILY.   rivaroxaban 20 MG Tabs tablet Commonly known as:  XARELTO Take 1 tablet (20 mg total) by mouth daily.   STOOL SOFTENER PO Take by mouth as needed.   VITAMIN B-12 PO Take by mouth daily.   zolpidem 10 MG tablet Commonly known as:  AMBIEN Take 0.5-1 tablets (5-10 mg total) by mouth at bedtime as needed.       Allergies:  Allergies  Allergen Reactions  . Lisinopril Swelling    Tongue swelling  . Myrbetriq [Mirabegron] Swelling    Of the tongue  . Atorvastatin     Dizziness and Fatigue   . Dabigatran Etexilate Mesylate     Stomach ulcers (Pradaxa)  . Sertraline     Tongue swelling    Family History: Family History  Problem Relation Age of Onset  . Heart disease Mother 30    CABG  . Hypertension Mother   . Heart attack Mother   . Hypertension Brother   . Heart disease Sister     stents  . Prostate cancer Brother   . Kidney cancer  Brother   . Skin cancer Brother   . Hypertension Brother   . Bladder Cancer Sister   . Leukemia Father   . Stroke Father     multiple  . Cerebral aneurysm Sister   .  Kidney cancer Brother     Social History:  reports that he quit smoking about 45 years ago. He has a 12.00 pack-year smoking history. He quit smokeless tobacco use about 2 years ago. His smokeless tobacco use included Chew. He reports that he drinks about 8.4 oz of alcohol per week . He reports that he does not use drugs.  ROS: UROLOGY Frequent Urination?: No Hard to postpone urination?: No Burning/pain with urination?: No Get up at night to urinate?: Yes Leakage of urine?: Yes Urine stream starts and stops?: Yes Trouble starting stream?: No Do you have to strain to urinate?: No Blood in urine?: No Urinary tract infection?: No Sexually transmitted disease?: No Injury to kidneys or bladder?: No Painful intercourse?: No Weak stream?: No Erection problems?: No Penile pain?: No  Gastrointestinal Nausea?: No Vomiting?: No Indigestion/heartburn?: No Diarrhea?: No Constipation?: No  Constitutional Fever: No Night sweats?: No Weight loss?: No Fatigue?: No  Skin Skin rash/lesions?: No Itching?: No  Eyes Blurred vision?: No Double vision?: No  Ears/Nose/Throat Sore throat?: No Sinus problems?: No  Hematologic/Lymphatic Swollen glands?: No Easy bruising?: No  Cardiovascular Leg swelling?: No Chest pain?: No  Respiratory Cough?: No Shortness of breath?: No  Endocrine Excessive thirst?: No  Musculoskeletal Back pain?: No Joint pain?: No  Neurological Headaches?: No Dizziness?: No  Psychologic Depression?: No Anxiety?: No  Physical Exam: BP 137/69   Pulse (!) 57   Ht 5\' 10"  (1.778 m)   Wt 187 lb (84.8 kg)   BMI 26.83 kg/m   Constitutional: Well nourished. Alert and oriented, No acute distress. HEENT: [place] AT, moist mucus membranes. Trachea midline, no masses. Cardiovascular:  No clubbing, cyanosis, or edema. Respiratory: Normal respiratory effort, no increased work of breathing. Skin: No rashes, bruises or suspicious lesions. Lymph: No cervical or inguinal adenopathy. Neurologic: Grossly intact, no focal deficits, moving all 4 extremities. Psychiatric: Normal mood and affect.  Laboratory Data: Lab Results  Component Value Date   WBC 5.4 04/17/2016   HGB 12.8 (A) 02/28/2015   HCT 40.8 04/17/2016   MCV 86 04/17/2016   PLT 106 (L) 04/17/2016    Lab Results  Component Value Date   CREATININE 1.09 04/17/2016   Lab Results  Component Value Date   HGBA1C 6.3 (H) 05/21/2016      Component Value Date/Time   CHOL 119 04/17/2016 1119   HDL 31 (L) 04/17/2016 1119   CHOLHDL 3.8 04/17/2016 1119   LDLCALC 42 04/17/2016 1119    Lab Results  Component Value Date   AST 42 (H) 08/05/2016   Lab Results  Component Value Date   ALT 33 08/05/2016    Pertinent Imaging: Results for JAELYN, CLONINGER (MRN [id number]) as of 01/20/2017 14:44  Ref. Range 01/20/2017 14:37  Scan Result Unknown 0      Assessment & Plan:    1. Prostate cancer: (Not addressed at this visit)  Patient underwent RRP in 1995. His PSA remained below 1 until 2014 when it rose to 9.8 ng/mL. He was started on ADT and his PSA returned to <0.1ng/mL. He will RTC in 6 months for lupron, PSA and exam.    2. Incontinence:   - Lisbeth Ply is effective  - continue the medication  3. Frequency  - Lisbeth Ply is effective  - continue the medication; script given  Return in about 6 months (around 07/23/2017) for Lupron shot and PSA.  These notes generated with voice recognition software. I apologize for typographical errors.  Zara Council, PA-C    [facility] [address], [place], [place] [address] [phone]

## 2017-01-20 NOTE — Telephone Encounter (Signed)
RX called in at [facility]

## 2017-01-20 NOTE — Telephone Encounter (Signed)
Pharmacy requesting refill Last ov  01/01/17 Last filled 04/29/16 Please review. Thank you. sd

## 2017-01-20 NOTE — Progress Notes (Signed)
Patient: Ian Sanders Male    DOB: [birth year]   76 y.o.   MRN: [id number] Visit Date: 01/20/2017  Today's Provider: Lelon Huh, MD   Chief Complaint  Patient presents with  . Depression   Subjective:    Depression         The onset quality is gradual.   The problem has been gradually worsening since onset.  Associated symptoms include decreased concentration, fatigue, insomnia, irritable and decreased interest.  Past treatments include SSRIs - Selective serotonin reuptake inhibitors (patient has tried Zoloft 50mg  before in the past. He reports that he ran out of medicaton and never had it refilled. Patient denies any side effects to taking an SSRI.).   He states his mood was much better when taking sertraline. There was some concern about possible tongue swelling when he stopped medications, but he now states he is sure that sertraline did not cause tongue swelling.     Allergies  Allergen Reactions  . Lisinopril Swelling    Tongue swelling  . Myrbetriq [Mirabegron] Swelling    Of the tongue  . Atorvastatin     Dizziness and Fatigue   . Dabigatran Etexilate Mesylate     Stomach ulcers (Pradaxa)  . Sertraline     Tongue swelling     Current Outpatient Prescriptions:  .  atenolol-chlorthalidone (TENORETIC) 50-25 MG tablet, TAKE 1 TABLET DAILY., Disp: 90 tablet, Rfl: 4 .  BIOTIN PO, Take by mouth daily., Disp: , Rfl:  .  calcium-vitamin D (OSCAL) 250-125 MG-UNIT per tablet, Take by mouth., Disp: , Rfl:  .  Cyanocobalamin (VITAMIN B-12 PO), Take by mouth daily., Disp: , Rfl:  .  Docusate Calcium (STOOL SOFTENER PO), Take by mouth as needed., Disp: , Rfl:  .  EPINEPHrine (EPIPEN 2-PAK) 0.3 mg/0.3 mL IJ SOAJ injection, Inject 0.3 mg into the muscle., Disp: , Rfl:  .  fesoterodine (TOVIAZ) 4 MG TB24 tablet, Take 1 tablet (4 mg total) by mouth daily., Disp: 30 tablet, Rfl: 11 .  flecainide (TAMBOCOR) 50 MG tablet, Take 1 tablet (50 mg total) by mouth 2 (two) times  daily., Disp: 180 tablet, Rfl: 3 .  Leuprolide Acetate, 6 Month, (LUPRON) 45 MG injection, Inject 45 mg into the muscle every 6 (six) months., Disp: , Rfl:  .  omeprazole (PRILOSEC) 40 MG capsule, TAKE ONE CAPSULE DAILY., Disp: 90 capsule, Rfl: 4 .  rivaroxaban (XARELTO) 20 MG TABS tablet, Take 1 tablet (20 mg total) by mouth daily., Disp: 30 tablet, Rfl: 11 .  zolpidem (AMBIEN) 10 MG tablet, Take 0.5-1 tablets (5-10 mg total) by mouth at bedtime as needed., Disp: 90 tablet, Rfl: 1  Review of Systems  Constitutional: Positive for fatigue.  Psychiatric/Behavioral: Positive for decreased concentration and depression. The patient has insomnia.     Social History  Substance Use Topics  . Smoking status: Former Smoker    Packs/day: 1.00    Years: 12.00    Quit date: 10/28/1971  . Smokeless tobacco: Former Systems developer    Types: Chew    Quit date: 01/02/2015  . Alcohol use 8.4 oz/week    14 Standard drinks or equivalent per week     Comment: moderate  /patient states occasional   Objective:   BP (!) 142/60 (BP Location: Right Arm, Patient Position: Sitting, Cuff Size: Normal)   Pulse (!) 58   Temp 98 F (36.7 C)   Resp 16   Wt 188 lb (85.3 kg)   BMI  26.98 kg/m     Physical Exam  Constitutional: He appears well-developed and well-nourished. He is irritable.  Psychiatric: His mood appears not anxious. His affect is not angry. He does not exhibit a depressed mood.        Assessment & Plan:     1. Mild episode of recurrent major depressive disorder ([place]) He states he did much better when he was taking sertraline and is certain he had no adverse effects.  - sertraline (ZOLOFT) 50 MG tablet; 1/2 tablet for 6 days, then increase to 1 tablet per day  Dispense: 90 tablet; Refill: 2       Lelon Huh, MD  [facility]

## 2017-01-20 NOTE — Telephone Encounter (Signed)
Please call in zolpidem  

## 2017-03-17 DIAGNOSIS — L570 Actinic keratosis: Secondary | ICD-10-CM | POA: Diagnosis not present

## 2017-03-17 DIAGNOSIS — Z859 Personal history of malignant neoplasm, unspecified: Secondary | ICD-10-CM | POA: Diagnosis not present

## 2017-04-20 ENCOUNTER — Ambulatory Visit (INDEPENDENT_AMBULATORY_CARE_PROVIDER_SITE_OTHER): Payer: PPO

## 2017-04-20 VITALS — BP 124/60 | HR 60 | Temp 99.0°F | Ht 70.0 in | Wt 187.6 lb

## 2017-04-20 DIAGNOSIS — E785 Hyperlipidemia, unspecified: Secondary | ICD-10-CM

## 2017-04-20 DIAGNOSIS — Z0000 Encounter for general adult medical examination without abnormal findings: Secondary | ICD-10-CM

## 2017-04-20 DIAGNOSIS — E119 Type 2 diabetes mellitus without complications: Secondary | ICD-10-CM | POA: Diagnosis not present

## 2017-04-20 NOTE — Patient Instructions (Signed)
Ian Sanders , Thank you for taking time to come for your Medicare Wellness Visit. I appreciate your ongoing commitment to your health goals. Please review the following plan we discussed and let me know if I can assist you in the future.   Screening recommendations/referrals: Colonoscopy: completed 05/11/14 Recommended yearly ophthalmology/optometry visit for glaucoma screening and checkup Recommended yearly dental visit for hygiene and checkup  Vaccinations: Influenza vaccine: up to date, due 07/2017 Pneumococcal vaccine: completed series Tdap vaccine: completed 05/29/16, due 05/2026 Shingles vaccine: declined   Advanced directives: Please bring a copy of your POA (Power of [place]) and/or Living Will to your next appointment.   Conditions/risks identified: Recommend increasing exercise. Pt to work up to walking 3 days a week for 30 minutes.   Next appointment: 04/27/17 @ 9:00 AM  Preventive Care 65 Years and Older, Male Preventive care refers to lifestyle choices and visits with your health care provider that can promote health and wellness. What does preventive care include?  A yearly physical exam. This is also called an annual well check.  Dental exams once or twice a year.  Routine eye exams. Ask your health care provider how often you should have your eyes checked.  Personal lifestyle choices, including:  Daily care of your teeth and gums.  Regular physical activity.  Eating a healthy diet.  Avoiding tobacco and drug use.  Limiting alcohol use.  Practicing safe sex.  Taking low doses of aspirin every day.  Taking vitamin and mineral supplements as recommended by your health care provider. What happens during an annual well check? The services and screenings done by your health care provider during your annual well check will depend on your age, overall health, lifestyle risk factors, and family history of disease. Counseling  Your health care provider may ask  you questions about your:  Alcohol use.  Tobacco use.  Drug use.  Emotional well-being.  Home and relationship well-being.  Sexual activity.  Eating habits.  History of falls.  Memory and ability to understand (cognition).  Work and work Statistician. Screening  You may have the following tests or measurements:  Height, weight, and BMI.  Blood pressure.  Lipid and cholesterol levels. These may be checked every 5 years, or more frequently if you are over 75 years old.  Skin check.  Lung cancer screening. You may have this screening every year starting at age 47 if you have a 30-pack-year history of smoking and currently smoke or have quit within the past 15 years.  Fecal occult blood test (FOBT) of the stool. You may have this test every year starting at age 88.  Flexible sigmoidoscopy or colonoscopy. You may have a sigmoidoscopy every 5 years or a colonoscopy every 10 years starting at age 46.  Prostate cancer screening. Recommendations will vary depending on your family history and other risks.  Hepatitis C blood test.  Hepatitis B blood test.  Sexually transmitted disease (STD) testing.  Diabetes screening. This is done by checking your blood sugar (glucose) after you have not eaten for a while (fasting). You may have this done every 1-3 years.  Abdominal aortic aneurysm (AAA) screening. You may need this if you are a current or former smoker.  Osteoporosis. You may be screened starting at age 11 if you are at high risk. Talk with your health care provider about your test results, treatment options, and if necessary, the need for more tests. Vaccines  Your health care provider may recommend certain vaccines, such as:  Influenza vaccine. This is recommended every year.  Tetanus, diphtheria, and acellular pertussis (Tdap, Td) vaccine. You may need a Td booster every 10 years.  Zoster vaccine. You may need this after age 19.  Pneumococcal 13-valent conjugate  (PCV13) vaccine. One dose is recommended after age 33.  Pneumococcal polysaccharide (PPSV23) vaccine. One dose is recommended after age 84. Talk to your health care provider about which screenings and vaccines you need and how often you need them. This information is not intended to replace advice given to you by your health care provider. Make sure you discuss any questions you have with your health care provider. Document Released: 11/29/2015 Document Revised: 07/22/2016 Document Reviewed: 09/03/2015 Elsevier Interactive Patient Education  2017 [place] Prevention in the Home Falls can cause injuries. They can happen to people of all ages. There are many things you can do to make your home safe and to help prevent falls. What can I do on the outside of my home?  Regularly fix the edges of walkways and driveways and fix any cracks.  Remove anything that might make you trip as you walk through a door, such as a raised step or threshold.  Trim any bushes or trees on the path to your home.  Use bright outdoor lighting.  Clear any walking paths of anything that might make someone trip, such as rocks or tools.  Regularly check to see if handrails are loose or broken. Make sure that both sides of any steps have handrails.  Any raised decks and porches should have guardrails on the edges.  Have any leaves, snow, or ice cleared regularly.  Use sand or salt on walking paths during winter.  Clean up any spills in your garage right away. This includes oil or grease spills. What can I do in the bathroom?  Use night lights.  Install grab bars by the toilet and in the tub and shower. Do not use towel bars as grab bars.  Use non-skid mats or decals in the tub or shower.  If you need to sit down in the shower, use a plastic, non-slip stool.  Keep the floor dry. Clean up any water that spills on the floor as soon as it happens.  Remove soap buildup in the tub or shower  regularly.  Attach bath mats securely with double-sided non-slip rug tape.  Do not have throw rugs and other things on the floor that can make you trip. What can I do in the bedroom?  Use night lights.  Make sure that you have a light by your bed that is easy to reach.  Do not use any sheets or blankets that are too big for your bed. They should not hang down onto the floor.  Have a firm chair that has side arms. You can use this for support while you get dressed.  Do not have throw rugs and other things on the floor that can make you trip. What can I do in the kitchen?  Clean up any spills right away.  Avoid walking on wet floors.  Keep items that you use a lot in easy-to-reach places.  If you need to reach something above you, use a strong step stool that has a grab bar.  Keep electrical cords out of the way.  Do not use floor polish or wax that makes floors slippery. If you must use wax, use non-skid floor wax.  Do not have throw rugs and other things on the floor that  can make you trip. What can I do with my stairs?  Do not leave any items on the stairs.  Make sure that there are handrails on both sides of the stairs and use them. Fix handrails that are broken or loose. Make sure that handrails are as long as the stairways.  Check any carpeting to make sure that it is firmly attached to the stairs. Fix any carpet that is loose or worn.  Avoid having throw rugs at the top or bottom of the stairs. If you do have throw rugs, attach them to the floor with carpet tape.  Make sure that you have a light switch at the top of the stairs and the bottom of the stairs. If you do not have them, ask someone to add them for you. What else can I do to help prevent falls?  Wear shoes that:  Do not have high heels.  Have rubber bottoms.  Are comfortable and fit you well.  Are closed at the toe. Do not wear sandals.  If you use a stepladder:  Make sure that it is fully  opened. Do not climb a closed stepladder.  Make sure that both sides of the stepladder are locked into place.  Ask someone to hold it for you, if possible.  Clearly mark and make sure that you can see:  Any grab bars or handrails.  First and last steps.  Where the edge of each step is.  Use tools that help you move around (mobility aids) if they are needed. These include:  Canes.  Walkers.  Scooters.  Crutches.  Turn on the lights when you go into a dark area. Replace any light bulbs as soon as they burn out.  Set up your furniture so you have a clear path. Avoid moving your furniture around.  If any of your floors are uneven, fix them.  If there are any pets around you, be aware of where they are.  Review your medicines with your doctor. Some medicines can make you feel dizzy. This can increase your chance of falling. Ask your doctor what other things that you can do to help prevent falls. This information is not intended to replace advice given to you by your health care provider. Make sure you discuss any questions you have with your health care provider. Document Released: 08/29/2009 Document Revised: 04/09/2016 Document Reviewed: 12/07/2014 Elsevier Interactive Patient Education  2017 [employer].

## 2017-04-20 NOTE — Progress Notes (Signed)
Subjective:   Ian Sanders is a 76 y.o. male who presents for Medicare Annual/Subsequent preventive examination.  Review of Systems:  N/A  Cardiac Risk Factors include: advanced age (>19men, >77 women);diabetes mellitus;dyslipidemia;hypertension;male gender;sedentary lifestyle;smoking/ tobacco exposure     Objective:    Vitals: BP 124/60 (BP Location: Right Arm)   Pulse 60   Temp 99 F (37.2 C) (Oral)   Ht 5\' 10"  (1.778 m)   Wt 187 lb 9.6 oz (85.1 kg)   BMI 26.92 kg/m   Body mass index is 26.92 kg/m.  Tobacco History  Smoking Status  . Former Smoker  . Packs/day: 1.00  . Years: 12.00  . Quit date: 10/28/1971  Smokeless Tobacco  . Current User  . Types: Chew     Ready to quit: Yes Counseling given: Not Answered   Past Medical History:  Diagnosis Date  . Atrial fibrillation ([place])   . Benign paroxysmal positional vertigo   . History of chicken pox   . History of measles   . History of mumps   . Mononeuritis of unspecified site   . Other seborrheic keratosis   . Stroke ([place])   . 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 Unspecified hypothyroidism    Past Surgical History:  Procedure Laterality Date  . Carotid Doppler Ultrasound  04/02/2010   Small amount Calcified plaque bilaterally, no significant stenosis.  . COLONOSCOPY  05/11/2014   Tubular Adenoma. Dr. Allen Norris  . DOPPLER ECHOCARDIOGRAPHY  04/02/2010   Mild left atrial dilation. Normal right atrium. No valvular disease. No thrombus. Normal LV function. EF>55%  . FINGER AMPUTATION    . HERNIA REPAIR    . MRI Brain with and without contrast  03/20/2010   Suggestive of basal ganglia lacunar infarct  . PROSTATECTOMY  1995   Abdominal  . SKIN SURGERY     multiple  . Thumb surgery     Family History  Problem Relation Age of Onset  . Heart disease Mother 66       CABG  . Hypertension Mother   . Heart attack Mother   . Hypertension Brother   .  Heart disease Sister        stents  . Prostate cancer Brother   . Kidney cancer Brother   . Skin cancer Brother   . Hypertension Brother   . Bladder Cancer Sister   . Leukemia Father   . Stroke Father        multiple  . Cerebral aneurysm Sister   . Kidney cancer Brother    History  Sexual Activity  . Sexual activity: Not on file    Outpatient Encounter Prescriptions as of 04/20/2017  Medication Sig  . atenolol-chlorthalidone (TENORETIC) 50-25 MG tablet TAKE 1 TABLET DAILY.  Marland Kitchen BIOTIN PO Take by mouth daily.  . calcium-vitamin D (OSCAL) 250-125 MG-UNIT per tablet Take by mouth.  . Cyanocobalamin (VITAMIN B-12 PO) Take by mouth daily.  Mariane Baumgarten Calcium (STOOL SOFTENER PO) Take by mouth as needed.  Marland Kitchen EPINEPHrine (EPIPEN 2-PAK) 0.3 mg/0.3 mL IJ SOAJ injection Inject 0.3 mg into the muscle.  . fesoterodine (TOVIAZ) 4 MG TB24 tablet Take 1 tablet (4 mg total) by mouth daily. (Patient taking differently: Take 4 mg by mouth daily. )  . flecainide (TAMBOCOR) 50 MG tablet Take 1 tablet (50 mg total) by mouth 2 (two) times daily. (Patient taking differently: Take 100 mg by mouth 2 (two) times daily. )  . Leuprolide  Acetate, 6 Month, (LUPRON) 45 MG injection Inject 45 mg into the muscle every 6 (six) months.  Marland Kitchen omeprazole (PRILOSEC) 40 MG capsule TAKE ONE CAPSULE DAILY.  . rivaroxaban (XARELTO) 20 MG TABS tablet Take 1 tablet (20 mg total) by mouth daily.  Marland Kitchen zolpidem (AMBIEN) 10 MG tablet Take 0.5-1 tablets (5-10 mg total) by mouth at bedtime as needed.  . [DISCONTINUED] sertraline (ZOLOFT) 50 MG tablet 1/2 tablet for 6 days, then increase to 1 tablet per day (Patient not taking: Reported on 04/20/2017)   No facility-administered encounter medications on file as of 04/20/2017.     Activities of Daily Living In your present state of health, do you have any difficulty performing the following activities: 04/20/2017  Hearing? Y  Vision? Y  Difficulty concentrating or making decisions? Y  Walking  or climbing stairs? Y  Dressing or bathing? N  Doing errands, shopping? N  Preparing Food and eating ? N  Using the Toilet? N  In the past six months, have you accidently leaked urine? Y  Do you have problems with loss of bowel control? N  Managing your Medications? N  Managing your Finances? N  Housekeeping or managing your Housekeeping? N  Some recent data might be hidden    Patient Care Team: Birdie Sons, MD as PCP - General (Family Medicine) Minna Merritts, MD as Consulting Physician (Cardiology) Laneta Simmers as Physician Assistant (Urology)   Assessment:     Exercise Activities and Dietary recommendations Current Exercise Habits: The patient does not participate in regular exercise at present, Exercise limited by: orthopedic condition(s)  Goals    . Exercise 3x per week (30 min per time)          Recommend increasing exercise. Pt to work up to walking 3 days a week for 30 minutes.       Fall Risk Fall Risk  04/20/2017 04/17/2016  Falls in the past year? No No   Depression Screen PHQ 2/9 Scores 04/20/2017 04/20/2017 04/17/2016  PHQ - 2 Score 1 1 6   PHQ- 9 Score 9 - 17    Cognitive Function     6CIT Screen 04/20/2017  What Year? 0 points  What month? 0 points  What time? 0 points  Count back from 20 0 points  Months in reverse 2 points  Repeat phrase 0 points  Total Score 2    Immunization History  Administered Date(s) Administered  . Influenza, High Dose Seasonal PF 08/09/2015, 07/23/2016  . Pneumococcal Conjugate-13 12/15/2013  . Pneumococcal Polysaccharide-23 11/16/2006  . Td 05/29/2016   Screening Tests Health Maintenance  Topic Date Due  . FOOT EXAM  10/22/1951  . URINE MICROALBUMIN  10/22/1951  . HEMOGLOBIN A1C  11/21/2016  . COLONOSCOPY  05/09/2017  . INFLUENZA VACCINE  06/16/2017  . OPHTHALMOLOGY EXAM  01/14/2018  . TETANUS/TDAP  05/29/2026  . PNA vac Low Risk Adult  Completed      Plan:  I have personally reviewed and  addressed the Medicare Annual Wellness questionnaire and have noted the following in the patient's chart:  A. Medical and social history B. Use of alcohol, tobacco or illicit drugs  C. Current medications and supplements D. Functional ability and status E.  Nutritional status F.  Physical activity G. Advance directives H. List of other physicians I.  Hospitalizations, surgeries, and ER visits in previous 12 months J.  [place] such as hearing and vision if needed, cognitive and depression L. Referrals and appointments -  none  In addition, I have reviewed and discussed with patient certain preventive protocols, quality metrics, and best practice recommendations. A written personalized care plan for preventive services as well as general preventive health recommendations were provided to patient.  See attached scanned questionnaire for additional information.   Signed,  Fabio Neighbors, LPN Nurse Health Advisor   MD Recommendations: Pt needs to have a diabetic foot exam and a urine microalbumin checked at next OV on 04/27/17.

## 2017-04-21 LAB — COMPREHENSIVE METABOLIC PANEL
ALK PHOS: 74 IU/L (ref 39–117)
ALT: 13 IU/L (ref 0–44)
AST: 27 IU/L (ref 0–40)
Albumin/Globulin Ratio: 1.9 (ref 1.2–2.2)
Albumin: 4.7 g/dL (ref 3.5–4.8)
BUN/Creatinine Ratio: 12 (ref 10–24)
BUN: 12 mg/dL (ref 8–27)
Bilirubin Total: 0.9 mg/dL (ref 0.0–1.2)
CALCIUM: 9.3 mg/dL (ref 8.6–10.2)
CO2: 28 mmol/L (ref 18–29)
CREATININE: 0.97 mg/dL (ref 0.76–1.27)
Chloride: 99 mmol/L (ref 96–106)
GFR calc Af Amer: 87 mL/min/{1.73_m2} (ref >59)
GFR, EST NON AFRICAN AMERICAN: 76 mL/min/{1.73_m2} (ref >59)
GLOBULIN, TOTAL: 2.5 g/dL (ref 1.5–4.5)
GLUCOSE: 105 mg/dL — AB (ref 65–99)
Potassium: 4.1 mmol/L (ref 3.5–5.2)
SODIUM: 144 mmol/L (ref 134–144)
Total Protein: 7.2 g/dL (ref 6.0–8.5)

## 2017-04-21 LAB — HEMOGLOBIN A1C
ESTIMATED AVERAGE GLUCOSE: 108 mg/dL
HEMOGLOBIN A1C: 5.4 % (ref 4.8–5.6)

## 2017-04-21 LAB — LIPID PANEL
CHOLESTEROL TOTAL: 115 mg/dL (ref 100–199)
Chol/HDL Ratio: 3.7 ratio (ref 0.0–5.0)
HDL: 31 mg/dL — ABNORMAL LOW (ref >39)
LDL CALC: 40 mg/dL (ref 0–99)
Triglycerides: 220 mg/dL — ABNORMAL HIGH (ref 0–149)
VLDL Cholesterol Cal: 44 mg/dL — ABNORMAL HIGH (ref 5–40)

## 2017-04-27 ENCOUNTER — Encounter: Payer: Self-pay | Admitting: Family Medicine

## 2017-04-27 ENCOUNTER — Ambulatory Visit (INDEPENDENT_AMBULATORY_CARE_PROVIDER_SITE_OTHER): Payer: PPO | Admitting: Family Medicine

## 2017-04-27 VITALS — BP 126/58 | HR 53 | Temp 97.7°F | Resp 16 | Ht 70.0 in | Wt 186.0 lb

## 2017-04-27 DIAGNOSIS — C61 Malignant neoplasm of prostate: Secondary | ICD-10-CM

## 2017-04-27 DIAGNOSIS — Z8679 Personal history of other diseases of the circulatory system: Secondary | ICD-10-CM

## 2017-04-27 DIAGNOSIS — I10 Essential (primary) hypertension: Secondary | ICD-10-CM

## 2017-04-27 DIAGNOSIS — E118 Type 2 diabetes mellitus with unspecified complications: Secondary | ICD-10-CM

## 2017-04-27 DIAGNOSIS — Z0000 Encounter for general adult medical examination without abnormal findings: Secondary | ICD-10-CM

## 2017-04-27 DIAGNOSIS — R109 Unspecified abdominal pain: Secondary | ICD-10-CM

## 2017-04-27 LAB — POCT URINALYSIS DIPSTICK
Bilirubin, UA: NEGATIVE
GLUCOSE UA: NEGATIVE
KETONES UA: NEGATIVE
Leukocytes, UA: NEGATIVE
Nitrite, UA: NEGATIVE
Protein, UA: NEGATIVE
UROBILINOGEN UA: 0.2 U/dL
pH, UA: 7.5 (ref 5.0–8.0)

## 2017-04-27 LAB — POCT UA - MICROALBUMIN: MICROALBUMIN (UR) POC: 20 mg/L

## 2017-04-27 NOTE — Progress Notes (Signed)
Patient: Ian Sanders, Male    DOB: [birth year], 76 y.o.   MRN: [id number] Visit Date: 04/27/2017  Today's Provider: Lelon Huh, MD   Chief Complaint  Patient presents with  . Annual Exam  . Hypertension    follow up  . Hyperglycemia    follow up  . Depression    follow up  . Insomnia    follow up   Subjective:    Annual physical exam Ian Sanders is a 76 y.o. male who presents today for health maintenance and complete physical. He feels well. He reports exercising daily. He reports he is sleeping fairly well.  -----------------------------------------------------------------  Hypertension, follow-up:  BP Readings from Last 3 Encounters:  04/20/17 124/60  01/20/17 (!) 142/60  01/20/17 137/69    He was last seen for hypertension 1 years ago.  BP at that visit was 120/70. Management since that visit includes no changes. He reports good compliance with treatment. He is not having side effects.  He is exercising. He is adherent to low salt diet.   Outside blood pressures are 129/85. He is experiencing none.  Patient denies chest pain, chest pressure/discomfort, claudication, dyspnea, exertional chest pressure/discomfort, irregular heart beat, lower extremity edema, near-syncope, orthopnea, palpitations, paroxysmal nocturnal dyspnea, syncope and tachypnea.   Cardiovascular risk factors include advanced age (older than 25 for men, 80 for women), hypertension and male gender.  Use of agents associated with hypertension: none.     Weight trend: stable Wt Readings from Last 3 Encounters:  04/20/17 187 lb 9.6 oz (85.1 kg)  01/20/17 188 lb (85.3 kg)  01/20/17 187 lb (84.8 kg)    Current diet: in general, a "healthy" diet    ------------------------------------------------------------------------   Hyperglycemia, Follow-up:   Lab Results  Component Value Date   HGBA1C 5.4 04/20/2017   HGBA1C 6.3 (H) 05/21/2016   HGBA1C 5.8 04/23/2015   GLUCOSE  105 (H) 04/20/2017   GLUCOSE 140 (H) 08/05/2016   GLUCOSE 161 (H) 04/17/2016    Last seen for for this 9 months ago.  Management since then includes counseling patient to avoid sweets and starchy foods. Current symptoms include none and have been stable.  Weight trend: stable Prior visit with dietician: no Current diet: in general, a "healthy" diet   Current exercise: walking  Pertinent Labs:    Component Value Date/Time   CHOL 115 04/20/2017 1119   TRIG 220 (H) 04/20/2017 1119   CHOLHDL 3.7 04/20/2017 1119   CREATININE 0.97 04/20/2017 1119   CREATININE 1.36 (H) 08/18/2012 0108    Wt Readings from Last 3 Encounters:  04/20/17 187 lb 9.6 oz (85.1 kg)  01/20/17 188 lb (85.3 kg)  01/20/17 187 lb (84.8 kg)   Follow up pf Depression:  Patient was last seen for this problem 3 months ago. Changes made during that visit includes restarting Zoloft. Today patient reports poor compliance with treatment. Patient states he is no longer taking Zoloft and his depression has improved.   Follow up of Insomnia:  Patient was last seen for this problem 9 months ago. Management during that visit includes counseling patient to take OTC Melatonin with Ambien if needed. Today patient reports that he only takes Ambien. He feels that he doesn't get any benefit when taking Melatonin.  He reports good compliance with treatment, good tolerance and fair symptom control.   He does complain of a ache on the left side of his back for the last couple of weeks, which only  bothers him when he lays down at night, it does not matter what position he lays. Not affected by coughing or taking a deep breath, no nausea. No radiation. Is not present when he gets up in the morning.   Review of Systems  Constitutional: Positive for fatigue. Negative for appetite change, chills and fever.  HENT: Positive for hearing loss, postnasal drip and tinnitus. Negative for congestion, ear pain, nosebleeds and trouble swallowing.     Eyes: Negative for pain and visual disturbance.  Respiratory: Negative for cough, chest tightness and shortness of breath.   Cardiovascular: Negative for chest pain, palpitations and leg swelling.  Gastrointestinal: Positive for abdominal pain and constipation. Negative for blood in stool, diarrhea, nausea and vomiting.  Endocrine: Negative for polydipsia, polyphagia and polyuria.  Genitourinary: Negative for dysuria and flank pain.  Musculoskeletal: Positive for arthralgias and back pain. Negative for joint swelling, myalgias and neck stiffness.  Skin: Negative for color change, rash and wound.  Allergic/Immunologic: Positive for environmental allergies.  Neurological: Positive for tremors and numbness. Negative for dizziness, seizures, speech difficulty, weakness, light-headedness and headaches.  Psychiatric/Behavioral: Negative for behavioral problems, confusion, decreased concentration, dysphoric mood and sleep disturbance. The patient is not nervous/anxious.   All other systems reviewed and are negative.   Social History      He  reports that he quit smoking about 45 years ago. He has a 12.00 pack-year smoking history. His smokeless tobacco use includes Chew. He reports that he drinks about 14.4 oz of alcohol per week . He reports that he does not use drugs.       Social History   Social History  . Marital status: Married    Spouse name: N/A  . Number of children: N/A  . Years of education: N/A   Occupational History  . Retired    Social History Main Topics  . Smoking status: Former Smoker    Packs/day: 1.00    Years: 12.00    Quit date: 10/28/1971  . Smokeless tobacco: Current User    Types: Chew  . Alcohol use 14.4 oz/week    14 Standard drinks or equivalent, 10 Cans of beer per week  . Drug use: No  . Sexual activity: Not Asked   Other Topics Concern  . None   Social History Narrative  . None    Past Medical History:  Diagnosis Date  . Atrial fibrillation  ([place])   . Benign paroxysmal positional vertigo   . History of chicken pox   . History of measles   . History of mumps   . Mononeuritis of unspecified site   . Other seborrheic keratosis   . Stroke ([place])   . 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 Unspecified hypothyroidism      Patient Active Problem List   Diagnosis Date Noted  . Weight loss 10/30/2016  . Encounter for anticoagulation discussion and counseling 10/30/2016  . Cornu cutaneum 06/29/2016  . Laceration of ankle 06/29/2016  . LBP (low back pain) 06/29/2016  . Glossal swelling 06/29/2016  . Aortic atherosclerosis ([place]) 05/05/2016  . Fatty liver 05/05/2016  . Depression 04/17/2016  . Regular alcohol consumption 04/17/2016  . Neuropathy 04/16/2016  . Hernia, inguinal, right 04/16/2016  . Melena 04/16/2016  . Elevated liver function tests 04/16/2016  . Tobacco abuse 04/16/2016  . Urge incontinence 07/17/2015  . Incontinence 06/20/2015  . Prostate cancer ([place]) 06/20/2015  . Diabetes mellitus ([place]) 12/19/2013  . Elevated prostate  specific antigen (PSA) 12/19/2013  . Groin pain 02/21/2013  . 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 HTN (hypertension) 12/14/2011  . Atrial fibrillation, currently in sinus rhythm 12/14/2011  . Hypothyroidism 05/13/2010  . Sacroiliac sprain 04/28/2010  . History of CVA (cerebrovascular accident) 03/21/2010  . Headache 03/10/2010  . Benign paroxysmal positional vertigo 01/15/2010  . Insomnia 05/09/2009  . Allergic rhinitis 05/09/2009  . Esophageal reflux 05/09/2009  . Benign essential HTN 05/09/2009  . 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 Carotid Doppler Ultrasound  04/02/2010   Small amount Calcified plaque bilaterally, no significant stenosis.  . COLONOSCOPY  05/11/2014   Tubular Adenoma. Dr. Allen Norris  . DOPPLER ECHOCARDIOGRAPHY  04/02/2010     Mild left atrial dilation. Normal right atrium. No valvular disease. No thrombus. Normal LV function. EF>55%  . FINGER AMPUTATION    . HERNIA REPAIR    . MRI Brain with and without contrast  03/20/2010   Suggestive of basal ganglia lacunar infarct  . PROSTATECTOMY  1995   Abdominal  . SKIN SURGERY     multiple  . Thumb surgery      Family History        Family Status  Relation Status  . Mother Deceased at age 49  . Brother Alive  . Sister Alive  . Brother Deceased       Cause of Death: Self inflicted gun shot wound  . Brother Alive       prostate issues  . Sister Alive  . Father Deceased       leukemia  . Sister Alive  . Sister Deceased at age 47       cerebral aneurysm  . Brother Alive  . Brother (Not Specified)        His family history includes Bladder Cancer in his sister; Cerebral aneurysm in his sister; Heart attack in his mother; Heart disease in his sister; Heart disease (age of onset: 22) in his mother; Hypertension in his brother, brother, and mother; Kidney cancer in his brother and brother; Leukemia in his father; Prostate cancer in his brother; Skin cancer in his brother; Stroke in his father.     Allergies  Allergen Reactions  . Lisinopril Swelling    Tongue swelling  . Myrbetriq [Mirabegron] Swelling    Of the tongue  . Atorvastatin     Dizziness and Fatigue   . Dabigatran Etexilate Mesylate     Stomach ulcers ([place])     Current Outpatient Prescriptions:  .  atenolol-chlorthalidone (TENORETIC) 50-25 MG tablet, TAKE 1 TABLET DAILY., Disp: 90 tablet, Rfl: 4 .  BIOTIN PO, Take by mouth daily., Disp: , Rfl:  .  calcium-vitamin D (OSCAL) 250-125 MG-UNIT per tablet, Take by mouth., Disp: , Rfl:  .  Cyanocobalamin (VITAMIN B-12 PO), Take by mouth daily., Disp: , Rfl:  .  Docusate Calcium (STOOL SOFTENER PO), Take by mouth as needed., Disp: , Rfl:  .  EPINEPHrine (EPIPEN 2-PAK) 0.3 mg/0.3 mL IJ SOAJ injection, Inject 0.3 mg into the muscle., Disp: ,  Rfl:  .  fesoterodine (TOVIAZ) 4 MG TB24 tablet, Take 1 tablet (4 mg total) by mouth daily. (Patient taking differently: Take 4 mg by mouth daily. ), Disp: 30 tablet, Rfl: 11 .  flecainide (TAMBOCOR) 50 MG tablet, Take 1 tablet (50 mg total) by mouth 2 (two) times daily. (Patient taking differently: Take 100 mg by mouth 2 (two) times daily. ),  Disp: 180 tablet, Rfl: 3 .  Leuprolide Acetate, 6 Month, (LUPRON) 45 MG injection, Inject 45 mg into the muscle every 6 (six) months., Disp: , Rfl:  .  omeprazole (PRILOSEC) 40 MG capsule, TAKE ONE CAPSULE DAILY., Disp: 90 capsule, Rfl: 4 .  rivaroxaban (XARELTO) 20 MG TABS tablet, Take 1 tablet (20 mg total) by mouth daily., Disp: 30 tablet, Rfl: 11 .  zolpidem (AMBIEN) 10 MG tablet, Take 0.5-1 tablets (5-10 mg total) by mouth at bedtime as needed., Disp: 90 tablet, Rfl: 1   Patient Care Team: Birdie Sons, MD as PCP - General (Family Medicine) Minna Merritts, MD as Consulting Physician (Cardiology) Nori Riis, PA-C as Physician Assistant (Urology)      Objective:   Vitals: BP (!) 126/58 (BP Location: Left Arm, Patient Position: Sitting, Cuff Size: Normal)   Pulse (!) 53   Temp 97.7 F (36.5 C) (Oral)   Resp 16   Ht 5\' 10"  (1.778 m)   Wt 186 lb (84.4 kg)   SpO2 98% Comment: room air  BMI 26.69 kg/m   There were no vitals filed for this visit.   Physical Exam   General Appearance:    Alert, cooperative, no distress, appears stated age  Head:    Normocephalic, without obvious abnormality, atraumatic  Eyes:    PERRL, conjunctiva/corneas clear, EOM's intact, fundi    benign, both eyes       Ears:    Normal TM's and external ear canals, both ears  Nose:   Nares normal, septum midline, mucosa normal, no drainage   or sinus tenderness  Throat:   Lips, mucosa, and tongue normal; teeth and gums normal  Neck:   Supple, symmetrical, trachea midline, no adenopathy;       thyroid:  No enlargement/tenderness/nodules; no carotid    bruit or JVD  Back:     Symmetric, no curvature, ROM normal, no CVA tenderness  Lungs:     Clear to auscultation bilaterally, respirations unlabored  Chest wall:    No tenderness or deformity  Heart:    Regular rate and rhythm, S1 and S2 normal, no murmur, rub   or gallop  Abdomen:     Soft, non-tender, bowel sounds active all four quadrants,    no masses, no organomegaly. No CVA tenderness.   Genitalia:    deferred  Rectal:    deferred  Extremities:   Extremities normal, atraumatic, no cyanosis or edema  Pulses:   2+ and symmetric all extremities  Skin:   Skin color, texture, turgor normal, no rashes or lesions  Lymph nodes:   Cervical, supraclavicular, and axillary nodes normal  Neurologic:   CNII-XII intact. Normal strength, sensation and reflexes      throughout    Depression Screen PHQ 2/9 Scores 04/20/2017 04/20/2017 04/17/2016  PHQ - 2 Score 1 1 6   PHQ- 9 Score 9 - 17    Current Exercise Habits: Home exercise routine, Type of exercise: walking, Time (Minutes): 10, Frequency (Times/Week): 7, Weekly Exercise (Minutes/Week): 70, Intensity: Mild Exercise limited by: None identified  Results for orders placed or performed in visit on 04/27/17  POCT Urinalysis Dipstick  Result Value Ref Range   Color, UA yellow    Clarity, UA clear    Glucose, UA negative    Bilirubin, UA negative    Ketones, UA negative    Spec Grav, UA <=1.005 (A) 1.010 - 1.025   Blood, UA Trace (Hemolyzed)    pH, UA 7.5 5.0 -  8.0   Protein, UA negative    Urobilinogen, UA 0.2 0.2 or 1.0 E.U./dL   Nitrite, UA negative    Leukocytes, UA Negative Negative  POCT UA - Microalbumin  Result Value Ref Range   Microalbumin Ur, POC 20 mg/L   Creatinine, POC n/a mg/dL   Albumin/Creatinine Ratio, Urine, POC n/a      Assessment & Plan:     Routine Health Maintenance and Physical Exam  Exercise Activities and Dietary recommendations Goals    . Exercise 3x per week (30 min per time)          Recommend  increasing exercise. Pt to work up to walking 3 days a week for 30 minutes.        Immunization History  Administered Date(s) Administered  . Influenza, High Dose Seasonal PF 08/09/2015, 07/23/2016  . Pneumococcal Conjugate-13 12/15/2013  . Pneumococcal Polysaccharide-23 11/16/2006  . Td 05/29/2016    Health Maintenance  Topic Date Due  . FOOT EXAM  10/22/1951  . URINE MICROALBUMIN  10/22/1950  . COLONOSCOPY  05/09/2017  . INFLUENZA VACCINE  06/16/2017  . HEMOGLOBIN A1C  10/20/2017  . OPHTHALMOLOGY EXAM  01/14/2018  . TETANUS/TDAP  05/29/2026  . PNA vac Low Risk Adult  Completed     Discussed health benefits of physical activity, and encouraged him to engage in regular exercise appropriate for his age and condition.    --------------------------------------------------------------------  1. Annual physical exam Generally doing well.   2. Essential hypertension Well controlled.  Continue current medications.    3. Atrial fibrillation, currently in sinus rhythm Asymptomatic. Compliant with medication.  Continue aggressive risk factor modification.  Normal rhythm today.   4. Type 2 diabetes mellitus with complication, without long-term current use of insulin (HCC) A1c have stayed consistently under 6.5% since 2015. Continue healthy diet.  - POCT UA - Microalbumin  5. Prostate cancer [facility]) Continue regular follow up with urology.   6. Left flank pain Likely muscular. Discussed finding of splenomegaly on ultrasoun about a year. He declined additional evaluation for now, but advised to call back if this becomes more bothersome or persistent.  - POCT Urinalysis Dipstick   Lelon Huh, MD  [place]

## 2017-06-28 DIAGNOSIS — Z872 Personal history of diseases of the skin and subcutaneous tissue: Secondary | ICD-10-CM | POA: Diagnosis not present

## 2017-06-28 DIAGNOSIS — Z859 Personal history of malignant neoplasm, unspecified: Secondary | ICD-10-CM | POA: Diagnosis not present

## 2017-06-28 DIAGNOSIS — L570 Actinic keratosis: Secondary | ICD-10-CM | POA: Diagnosis not present

## 2017-07-08 ENCOUNTER — Other Ambulatory Visit: Payer: PPO

## 2017-07-08 DIAGNOSIS — C61 Malignant neoplasm of prostate: Secondary | ICD-10-CM

## 2017-07-09 LAB — PSA: Prostate Specific Ag, Serum: <0.1 ng/mL (ref 0.0–4.0)

## 2017-07-12 ENCOUNTER — Ambulatory Visit: Payer: PPO | Admitting: Urology

## 2017-07-12 NOTE — Progress Notes (Signed)
07/13/2017 2:15 PM   [name] [birth year] [id number]  Referring provider: Birdie Sons, [place], [place] [address]  Chief Complaint  Patient presents with  . Prostate Cancer    follow up for Lupron and PVR    HPI: Mr. Ian Sanders is a 76 year old Caucasian with prostate cancer and incontinence who presents today for his 6 months Lupron.  Prostate cancer: Patient underwent RRP by Dr. Domingo Cocking at [facility] in 1995. His PSA remained below 1 until 2014 when it rose to 9.8 ng/mL. He was started on ADT with [place] in 03/15. He has received [place] for 4 months and then was switched to a longer acting agent, Lupron. He is having hot flashes, but he finds them manageable. He is taking 600mg  of calcium twice daily and vitamin d 400mg  twice daily. He reports a good appetite, no weight loss and no back pain. He has not had any recent fevers, chills, nausea and/or vomiting.  His last PSA was <0.1 ng/mL on 07/08/2017.  He is no longer experiencing hot flashes.    Incontinence: Patient's urinary symptoms are stable.  He has not had dysuria, fevers, chills, nausea or vomiting. He also denies suprapubic pain. He was seeing some improvement with Myrbetriq, but he experienced tongue swelling and discontinued the medication.  He was then switched to Lisbeth Ply and feels this medication is effective.  He did have to stop the medication due to dry eyes.  His current IPSS score is 11/3.  His previous IPSS score today was 16/5.  His PVR today is 130 mL.       IPSS    Row Name 07/13/17 1400         International Prostate Symptom Score   How often have you had the sensation of not emptying your bladder? Less than 1 in 5     How often have you had to urinate less than every two hours? Less than half the time     How often have you found you stopped and started again several times when you urinated? About half the time     How often have you found it difficult to postpone  urination? Less than 1 in 5 times     How often have you had a weak urinary stream? About half the time     How often have you had to strain to start urination? Not at All     How many times did you typically get up at night to urinate? 1 Time     Total IPSS Score 11       Quality of Life due to urinary symptoms   If you were to spend the rest of your life with your urinary condition just the way it is now how would you feel about that? Mixed        Score:  1-7 Mild 8-19 Moderate 20-35 Severe    PMH: Past Medical History:  Diagnosis Date  . Atrial fibrillation ([place])   . Benign paroxysmal positional vertigo   . History of chicken pox   . History of measles   . History of mumps   . Mononeuritis of unspecified site   . Other seborrheic keratosis   . Stroke ([place])   . 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 Unspecified hypothyroidism     Surgical History: Past Surgical History:  Procedure Laterality Date  . Carotid Doppler Ultrasound  04/02/2010  Small amount Calcified plaque bilaterally, no significant stenosis.  . COLONOSCOPY  05/11/2014   Tubular Adenoma. Dr. Allen Norris  . DOPPLER ECHOCARDIOGRAPHY  04/02/2010   Mild left atrial dilation. Normal right atrium. No valvular disease. No thrombus. Normal LV function. EF>55%  . FINGER AMPUTATION    . HERNIA REPAIR    . MRI Brain with and without contrast  03/20/2010   Suggestive of basal ganglia lacunar infarct  . PROSTATECTOMY  1995   Abdominal  . SKIN SURGERY     multiple  . Thumb surgery      Home Medications:  Allergies as of 07/13/2017      Reactions   Lisinopril Swelling   Tongue swelling   Myrbetriq [mirabegron] Swelling   Of the tongue   Atorvastatin    Dizziness and Fatigue   Dabigatran Etexilate Mesylate    Stomach ulcers (Pradaxa)      Medication List       Accurate as of 07/13/17  2:15 PM. Always use your most recent med list.            atenolol-chlorthalidone 50-25 MG tablet Commonly known as:  TENORETIC TAKE 1 TABLET DAILY.   BIOTIN PO Take by mouth daily.   calcium-vitamin D 250-125 MG-UNIT tablet Commonly known as:  OSCAL Take by mouth.   EPIPEN 2-PAK 0.3 mg/0.3 mL Soaj injection Generic drug:  EPINEPHrine Inject 0.3 mg into the muscle.   fesoterodine 4 MG Tb24 tablet Commonly known as:  TOVIAZ Take 1 tablet (4 mg total) by mouth daily.   flecainide 50 MG tablet Commonly known as:  TAMBOCOR Take 1 tablet (50 mg total) by mouth 2 (two) times daily.   Leuprolide Acetate (6 Month) 45 MG injection Commonly known as:  LUPRON Inject 45 mg into the muscle every 6 (six) months.   omeprazole 40 MG capsule Commonly known as:  PRILOSEC TAKE ONE CAPSULE DAILY.   rivaroxaban 20 MG Tabs tablet Commonly known as:  XARELTO Take 1 tablet (20 mg total) by mouth daily.   STOOL SOFTENER PO Take by mouth as needed.   VITAMIN B-12 PO Take by mouth daily.   zolpidem 10 MG tablet Commonly known as:  AMBIEN Take 0.5-1 tablets (5-10 mg total) by mouth at bedtime as needed.            Discharge Care Instructions        Start     Ordered   07/13/17 0000  BLADDER SCAN AMB NON-IMAGING     07/13/17 1349      Allergies:  Allergies  Allergen Reactions  . Lisinopril Swelling    Tongue swelling  . Myrbetriq [Mirabegron] Swelling    Of the tongue  . Atorvastatin     Dizziness and Fatigue   . Dabigatran Etexilate Mesylate     Stomach ulcers (Pradaxa)    Family History: Family History  Problem Relation Age of Onset  . Heart disease Mother 46       CABG  . Hypertension Mother   . Heart attack Mother   . Hypertension Brother   . Heart disease Sister        stents  . Prostate cancer Brother   . Kidney cancer Brother   . Skin cancer Brother   . Hypertension Brother   . Bladder Cancer Sister   . Leukemia Father   . Stroke Father        multiple  . Cerebral aneurysm Sister   . Kidney cancer  Brother  Social History:  reports that he quit smoking about 45 years ago. He has a 12.00 pack-year smoking history. His smokeless tobacco use includes Chew. He reports that he drinks about 14.4 oz of alcohol per week . He reports that he does not use drugs.  ROS: UROLOGY Frequent Urination?: No Hard to postpone urination?: No Burning/pain with urination?: No Get up at night to urinate?: No Leakage of urine?: No Urine stream starts and stops?: No Trouble starting stream?: No Do you have to strain to urinate?: No Blood in urine?: No Urinary tract infection?: No Sexually transmitted disease?: No Injury to kidneys or bladder?: No Painful intercourse?: No Weak stream?: Yes Erection problems?: No Penile pain?: No  Gastrointestinal Nausea?: No Vomiting?: No Indigestion/heartburn?: No Diarrhea?: No Constipation?: Yes  Constitutional Fever: No Night sweats?: No Weight loss?: No Fatigue?: No  Skin Skin rash/lesions?: No Itching?: No  Eyes Blurred vision?: No Double vision?: No  Ears/Nose/Throat Sore throat?: No Sinus problems?: No  Hematologic/Lymphatic Swollen glands?: No Easy bruising?: No  Cardiovascular Leg swelling?: No Chest pain?: No  Respiratory Cough?: No Shortness of breath?: No  Endocrine Excessive thirst?: No  Musculoskeletal Back pain?: No Joint pain?: No  Neurological Headaches?: No Dizziness?: No  Psychologic Depression?: No Anxiety?: No  Physical Exam: BP (!) 159/80   Pulse 62   Ht 5\' 10"  (1.778 m)   Wt 190 lb 11.2 oz (86.5 kg)   BMI 27.36 kg/m   Constitutional: Well nourished. Alert and oriented, No acute distress. HEENT: [place] AT, moist mucus membranes. Trachea midline, no masses. Cardiovascular: No clubbing, cyanosis, or edema. Respiratory: Normal respiratory effort, no increased work of breathing. Skin: No rashes, bruises or suspicious lesions. Lymph: No cervical or inguinal adenopathy. Neurologic: Grossly intact,  no focal deficits, moving all 4 extremities. Psychiatric: Normal mood and affect.  Laboratory Data: Lab Results  Component Value Date   CREATININE 0.97 04/20/2017   Lab Results  Component Value Date   HGBA1C 5.4 04/20/2017      Component Value Date/Time   CHOL 115 04/20/2017 1119   HDL 31 (L) 04/20/2017 1119   CHOLHDL 3.7 04/20/2017 1119   [place] 40 04/20/2017 1119    Lab Results  Component Value Date   AST 27 04/20/2017   Lab Results  Component Value Date   ALT 13 04/20/2017    Pertinent Imaging: Results for YAKOV, BERGEN (MRN [id number]) as of 07/13/2017 14:14  Ref. Range 07/13/2017 14:02  Scan Result Unknown 130      Assessment & Plan:    1. Prostate cancer:   Patient underwent RRP in 1995. His PSA remained below 1 until 2014 when it rose to 9.8 ng/mL. He was started on ADT and his PSA returned to <0.1ng/mL. He will RTC in 6 months for lupron, PSA and exam.    - Leuprolide Acetate (6 Month) (LUPRON) injection 45 mg; Inject 45 mg into the muscle once.  2. Incontinence: Patient experienced tongue swelling while on the Myrbetriq, so he discontinued the medication.  He is not bothered by the incontinence at this time.  3. Frequency  - patient given Toviaz 4 mg  - had to discontinue medication due to dry eyes  - not wanting to pursue other therapies at this time   Return in about 6 months (around 01/13/2018) for IPSS, PSA and exam.  These notes generated with voice recognition software. I apologize for typographical errors.  Zara Council, [facility] [address], [place], [place]  [address] [phone]   

## 2017-07-13 ENCOUNTER — Encounter: Payer: Self-pay | Admitting: Urology

## 2017-07-13 ENCOUNTER — Ambulatory Visit (INDEPENDENT_AMBULATORY_CARE_PROVIDER_SITE_OTHER): Payer: PPO | Admitting: Urology

## 2017-07-13 VITALS — BP 159/80 | HR 62 | Ht 70.0 in | Wt 190.7 lb

## 2017-07-13 DIAGNOSIS — R350 Frequency of micturition: Secondary | ICD-10-CM

## 2017-07-13 DIAGNOSIS — C61 Malignant neoplasm of prostate: Secondary | ICD-10-CM | POA: Diagnosis not present

## 2017-07-13 DIAGNOSIS — N3941 Urge incontinence: Secondary | ICD-10-CM

## 2017-07-13 LAB — BLADDER SCAN AMB NON-IMAGING: Scan Result: 130

## 2017-07-13 MED ORDER — LEUPROLIDE ACETATE (6 MONTH) 45 MG IM KIT
45.0000 mg | PACK | Freq: Once | INTRAMUSCULAR | Status: AC
  Administered 2017-07-13: 45 mg via INTRAMUSCULAR

## 2017-07-13 NOTE — Progress Notes (Signed)
Lupron IM Injection   Due to Prostate Cancer patient is present today for a Lupron Injection.  Medication: Lupron 6 month Dose: 45 mg  Location: left upper outer buttocks Lot: [id number] Exp: 09/11/2018  Patient tolerated well, no complications were noted  Performed by: Lyndee Hensen CMA  Follow up:   6 months

## 2017-07-13 NOTE — Addendum Note (Signed)
Addended by: Orlene Erm on: 07/13/2017 02:50 PM   Modules accepted: Orders

## 2017-07-14 DIAGNOSIS — I480 Paroxysmal atrial fibrillation: Secondary | ICD-10-CM | POA: Insufficient documentation

## 2017-07-14 NOTE — Progress Notes (Signed)
Cardiology Office Note  Date:  07/15/2017   ID:  Ian Sanders, Ian Sanders 1941/05/12, MRN [id number]  PCP:  Birdie Sons, MD   Chief Complaint  Patient presents with  . other    6 month follow up. Patient staets he is doing good. Meds reviewed verbally with patient.     HPI:  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 Caryn Section.  History of obstructive sleep apnea, does not use CPAP He presents for evaluation of his paroxysmal atrial fibrillation.   In follow-up today he reports that he is doing well Currently taking flecainide 50 twice a day Tolerating anticoagulation, compliant  Denies any palpitations or tachycardia concerning for atrial fibrillation  Otherwise feels well   Significant weight loss over the past year   "Does natural light beer", very little No regular exercise program , active Some gait instability  Has not required a statin in the past, total cholesterol 115  EKG personally reviewed by myself showing on today's visit shows sinus bradycardia with rate 60 bpm, no significant ST or T-wave changes  Other past medical history Echocardiogram May 2011 showed mildly dilated left atrium, no significant valvular disease, normal ejection fraction. Carotid ultrasound shows small calcified plaque bilaterally with no significant stenosis, May 2011  PMH:   has a past medical history of Atrial fibrillation ([place]); Benign paroxysmal positional vertigo; History of chicken pox; History of measles; History of mumps; Mononeuritis of unspecified site; Other seborrheic keratosis; Stroke ([place]); Transient ischemic attack (TIA), and cerebral  infarction without residual deficits(V12.54); and Unspecified hypothyroidism.  PSH:    Past Surgical History:  Procedure Laterality Date  . Carotid Doppler Ultrasound  04/02/2010   Small amount Calcified plaque bilaterally, no significant stenosis.  . COLONOSCOPY  05/11/2014   Tubular Adenoma. Dr. Allen Norris  . DOPPLER ECHOCARDIOGRAPHY  04/02/2010   Mild left atrial dilation. Normal right atrium. No valvular disease. No thrombus. Normal LV function. EF>55%  . FINGER AMPUTATION    . HERNIA REPAIR    . MRI Brain with and without contrast  03/20/2010   Suggestive of basal ganglia lacunar infarct  . PROSTATECTOMY  1995   Abdominal  . SKIN SURGERY     multiple  . Thumb surgery      Current Outpatient Prescriptions  Medication Sig Dispense Refill  . atenolol-chlorthalidone (TENORETIC) 50-25 MG tablet TAKE 1 TABLET DAILY. 90 tablet 4  . BIOTIN PO Take by mouth daily.    . calcium-vitamin D (OSCAL) 250-125 MG-UNIT per tablet Take by mouth.    . Cyanocobalamin (VITAMIN B-12 PO) Take by mouth daily.    Mariane Baumgarten Calcium (STOOL SOFTENER PO) Take by mouth as needed.    Marland Kitchen EPINEPHrine (EPIPEN 2-PAK) 0.3 mg/0.3 mL IJ SOAJ injection Inject 0.3 mg into the muscle.    . fesoterodine (TOVIAZ) 4 MG TB24 tablet Take 1 tablet (4 mg total) by mouth daily. (Patient taking differently: Take 4 mg by mouth daily. ) 30 tablet 11  . flecainide (TAMBOCOR) 50 MG tablet Take 1 tablet (50 mg total) by mouth 2 (two) times daily. (Patient taking differently: Take 100 mg by mouth 2 (two) times daily. )  180 tablet 3  . Leuprolide Acetate, 6 Month, (LUPRON) 45 MG injection Inject 45 mg into the muscle every 6 (six) months.    Marland Kitchen omeprazole (PRILOSEC) 40 MG capsule TAKE ONE CAPSULE DAILY. 90 capsule 4  . rivaroxaban (XARELTO) 20 MG TABS tablet Take 1 tablet (20 mg total) by mouth daily. 30 tablet 11  . zolpidem (AMBIEN) 10 MG tablet Take 0.5-1 tablets (5-10 mg total) by mouth at bedtime as needed. 90 tablet 1   No current  facility-administered medications for this visit.      Allergies:   Lisinopril; Myrbetriq [mirabegron]; Atorvastatin; and Dabigatran etexilate mesylate   Social History:  The patient  reports that he quit smoking about 45 years ago. He has a 12.00 pack-year smoking history. His smokeless tobacco use includes Chew. He reports that he drinks about 14.4 oz of alcohol per week . He reports that he does not use drugs.   Family History:   family history includes Bladder Cancer in his sister; Cerebral aneurysm in his sister; Heart attack in his mother; Heart disease in his sister; Heart disease (age of onset: 43) in his mother; Hypertension in his brother, brother, and mother; Kidney cancer in his brother and brother; Leukemia in his father; Prostate cancer in his brother; Skin cancer in his brother; Stroke in his father.    Review of Systems: Review of Systems  Constitutional: Negative.   Respiratory: Negative.   Cardiovascular: Negative.   Gastrointestinal: Negative.   Musculoskeletal: Negative.   Neurological: Negative.   Psychiatric/Behavioral: Negative.   All other systems reviewed and are negative.    PHYSICAL EXAM: VS:  BP (!) 120/58 (BP Location: Left Arm, Patient Position: Sitting, Cuff Size: Normal)   Pulse 60   Ht 5\' 10"  (1.778 m)   Wt 190 lb 12 oz (86.5 kg)   BMI 27.37 kg/m  , BMI Body mass index is 27.37 kg/m. GEN: Well nourished, well developed, in no acute distress  HEENT: normal  Neck: no JVD, carotid bruits, or masses Cardiac: RRR; no murmurs, rubs, or gallops,no edema  Respiratory:  clear to auscultation bilaterally, normal work of breathing GI: soft, nontender, nondistended, + BS MS: no deformity or atrophy  Skin: warm and dry, no rash Neuro:  Strength and sensation are intact Psych: euthymic mood, full affect    Recent Labs: 04/20/2017: ALT 13; BUN 12; Creatinine, Ser 0.97; Potassium 4.1; Sodium 144    Lipid Panel Lab Results  Component Value Date   CHOL  115 04/20/2017   HDL 31 (L) 04/20/2017   LDLCALC 40 04/20/2017   TRIG 220 (H) 04/20/2017      Wt Readings from Last 3 Encounters:  07/15/17 190 lb 12 oz (86.5 kg)  07/13/17 190 lb 11.2 oz (86.5 kg)  04/27/17 186 lb (84.4 kg)       ASSESSMENT AND PLAN:  Chest pain, unspecified type - Plan: EKG 12-Lead Denies any symptoms concerning for angina No further testing  Weight loss Weight stabilized over the past year  Aortic atherosclerosis (HCC) No changes to the medications were made. Nonsmoker, currently not on cholesterol medication  Atrial fibrillation, currently in sinus rhythm on  flecainide 50 mg twice a day with anticoagulation Previously was on higher dose, was maintaining normal sinus rhythm  Essential hypertension With weight loss, blood pressure improved Recommended he split the atenolol chlorthalidone dose Take atenolol daily, half dose chlorthalidone. Might be over to wean off chlorthalidone  History of CVA (cerebrovascular accident)  had good recovery, on  anticoagulation  Encounter for anticoagulation discussion and counseling Encouraged him to stay on his xarelto daily given history of stroke   Total encounter time more than 25 minutes  Greater than 50% was spent in counseling and coordination of care with the patient   Disposition:   F/U  12 months   No orders of the defined types were placed in this encounter.    Signed, Esmond Plants, M.D., Ph.D. 07/15/2017  [place], [place]

## 2017-07-15 ENCOUNTER — Encounter: Payer: Self-pay | Admitting: Cardiovascular Disease

## 2017-07-15 ENCOUNTER — Ambulatory Visit (INDEPENDENT_AMBULATORY_CARE_PROVIDER_SITE_OTHER): Payer: PPO | Admitting: Cardiovascular Disease

## 2017-07-15 VITALS — BP 120/58 | HR 60 | Ht 70.0 in | Wt 190.8 lb

## 2017-07-15 DIAGNOSIS — Z720 Tobacco use: Secondary | ICD-10-CM

## 2017-07-15 DIAGNOSIS — I480 Paroxysmal atrial fibrillation: Secondary | ICD-10-CM

## 2017-07-15 DIAGNOSIS — E118 Type 2 diabetes mellitus with unspecified complications: Secondary | ICD-10-CM

## 2017-07-15 DIAGNOSIS — G4730 Sleep apnea, unspecified: Secondary | ICD-10-CM | POA: Diagnosis not present

## 2017-07-15 DIAGNOSIS — Z7189 Other specified counseling: Secondary | ICD-10-CM | POA: Diagnosis not present

## 2017-07-15 MED ORDER — FLECAINIDE ACETATE 50 MG PO TABS: 50.0000 mg | ORAL_TABLET | Freq: Two times a day (BID) | ORAL | 3 refills | Status: AC

## 2017-07-15 MED ORDER — ATENOLOL 50 MG PO TABS: 50.0000 mg | ORAL_TABLET | Freq: Every day | ORAL | 3 refills | Status: DC

## 2017-07-15 MED ORDER — CHLORTHALIDONE 25 MG PO TABS: 25.0000 mg | ORAL_TABLET | Freq: Every day | ORAL | 3 refills | Status: DC

## 2017-07-15 NOTE — Patient Instructions (Addendum)
Medication Instructions:   When you run out of the atenolol chlorthalidone Change to atenolol one a day Chlorthalidone 1/2 pill daily (or as needed)  Labwork:  No new labs needed  Testing/Procedures:  No further testing at this time   Follow-Up: It was a pleasure seeing you in the office today. Please call us if you have new issues that need to be addressed before your next appt.  [phone]  Your physician wants you to follow-up in: 12 months.  You will receive a reminder letter in the mail two months in advance. If you don't receive a letter, please call our office to schedule the follow-up appointment.  If you need a refill on your cardiac medications before your next appointment, please call your pharmacy.

## 2017-07-26 ENCOUNTER — Other Ambulatory Visit: Payer: Self-pay | Admitting: Family Medicine

## 2017-07-26 MED ORDER — ZOLPIDEM TARTRATE 10 MG PO TABS: 5.0000 mg | ORAL_TABLET | Freq: Every evening | ORAL | 1 refills | Status: DC | PRN

## 2017-07-26 NOTE — Telephone Encounter (Signed)
Please call in zolpidem  

## 2017-07-26 NOTE — Telephone Encounter (Signed)
[facility] faxed a refill request on the following medications:  zolpidem (AMBIEN) 10 MG tablet.  90 day supply.  Take one-half to one tablet at bedtime if needed.    [facility]

## 2017-07-27 NOTE — Telephone Encounter (Signed)
Rx called in to pharmacy. 

## 2018-01-05 ENCOUNTER — Ambulatory Visit (INDEPENDENT_AMBULATORY_CARE_PROVIDER_SITE_OTHER): Payer: PPO

## 2018-01-05 VITALS — BP 163/75 | HR 63 | Ht 70.0 in | Wt 190.0 lb

## 2018-01-05 DIAGNOSIS — R350 Frequency of micturition: Secondary | ICD-10-CM | POA: Diagnosis not present

## 2018-01-05 LAB — MICROSCOPIC EXAMINATION: Epithelial Cells (non renal): NONE SEEN /hpf (ref 0–10)

## 2018-01-05 LAB — URINALYSIS, COMPLETE
Bilirubin, UA: NEGATIVE
GLUCOSE, UA: NEGATIVE
KETONES UA: NEGATIVE
Nitrite, UA: NEGATIVE
SPEC GRAV UA: 1.01 (ref 1.005–1.030)
Urobilinogen, Ur: 1 mg/dL (ref 0.2–1.0)
pH, UA: 7 (ref 5.0–7.5)

## 2018-01-05 MED ORDER — AMOXICILLIN-POT CLAVULANATE 875-125 MG PO TABS: 1.0000 | ORAL_TABLET | Freq: Two times a day (BID) | ORAL | 0 refills | Status: AC

## 2018-01-05 NOTE — Progress Notes (Signed)
Pt presents today with c/o urinary frequency, urgency, hard to postpone urination, dysuria, leakage of urine, and lower abd pain.  A clean catch was obtained for u/a and cx.   Blood pressure (!) 163/75, pulse 63, height 5\' 10"  (1.778 m), weight 190 lb (86.2 kg).  Per Ian Sanders pt was given augmentin bid x7 days.

## 2018-01-07 LAB — CULTURE, URINE COMPREHENSIVE

## 2018-01-11 ENCOUNTER — Telehealth: Payer: Self-pay

## 2018-01-11 NOTE — Telephone Encounter (Signed)
LMOM

## 2018-01-11 NOTE — Telephone Encounter (Signed)
-----   Message from Nori Riis, PA-C sent at 01/07/2018  4:45 PM EST ----- Please let Mr. Mende know that his urine culture is negative.  He needs an appointment to discuss the blood in his urine.

## 2018-01-12 ENCOUNTER — Other Ambulatory Visit: Payer: Self-pay | Admitting: Family Medicine

## 2018-01-14 ENCOUNTER — Encounter: Payer: Self-pay | Admitting: Emergency Medicine

## 2018-01-14 ENCOUNTER — Other Ambulatory Visit: Payer: Self-pay

## 2018-01-14 ENCOUNTER — Emergency Department
Admission: EM | Admit: 2018-01-14 | Discharge: 2018-01-14 | Disposition: A | Discharge status: Home / Self Care | Payer: PPO | Attending: Emergency Medicine | Admitting: Emergency Medicine

## 2018-01-14 ENCOUNTER — Emergency Department: Payer: PPO

## 2018-01-14 DIAGNOSIS — Y939 Activity, unspecified: Secondary | ICD-10-CM | POA: Insufficient documentation

## 2018-01-14 DIAGNOSIS — W19XXXA Unspecified fall, initial encounter: Secondary | ICD-10-CM

## 2018-01-14 DIAGNOSIS — Z8546 Personal history of malignant neoplasm of prostate: Secondary | ICD-10-CM | POA: Insufficient documentation

## 2018-01-14 DIAGNOSIS — Z87891 Personal history of nicotine dependence: Secondary | ICD-10-CM | POA: Diagnosis not present

## 2018-01-14 DIAGNOSIS — I10 Essential (primary) hypertension: Secondary | ICD-10-CM | POA: Diagnosis not present

## 2018-01-14 DIAGNOSIS — M25512 Pain in left shoulder: Secondary | ICD-10-CM | POA: Diagnosis not present

## 2018-01-14 DIAGNOSIS — Z7901 Long term (current) use of anticoagulants: Secondary | ICD-10-CM | POA: Insufficient documentation

## 2018-01-14 DIAGNOSIS — M25511 Pain in right shoulder: Secondary | ICD-10-CM | POA: Diagnosis not present

## 2018-01-14 DIAGNOSIS — Z79899 Other long term (current) drug therapy: Secondary | ICD-10-CM | POA: Insufficient documentation

## 2018-01-14 DIAGNOSIS — E119 Type 2 diabetes mellitus without complications: Secondary | ICD-10-CM | POA: Insufficient documentation

## 2018-01-14 DIAGNOSIS — W0110XA Fall on same level from slipping, tripping and stumbling with subsequent striking against unspecified object, initial encounter: Secondary | ICD-10-CM | POA: Insufficient documentation

## 2018-01-14 DIAGNOSIS — S098XXA Other specified injuries of head, initial encounter: Secondary | ICD-10-CM | POA: Diagnosis not present

## 2018-01-14 DIAGNOSIS — S0990XA Unspecified injury of head, initial encounter: Secondary | ICD-10-CM

## 2018-01-14 DIAGNOSIS — R51 Headache: Secondary | ICD-10-CM | POA: Diagnosis not present

## 2018-01-14 DIAGNOSIS — S161XXA Strain of muscle, fascia and tendon at neck level, initial encounter: Secondary | ICD-10-CM | POA: Diagnosis not present

## 2018-01-14 DIAGNOSIS — E039 Hypothyroidism, unspecified: Secondary | ICD-10-CM | POA: Diagnosis not present

## 2018-01-14 DIAGNOSIS — Z8673 Personal history of transient ischemic attack (TIA), and cerebral infarction without residual deficits: Secondary | ICD-10-CM | POA: Insufficient documentation

## 2018-01-14 DIAGNOSIS — Y999 Unspecified external cause status: Secondary | ICD-10-CM | POA: Insufficient documentation

## 2018-01-14 DIAGNOSIS — Y929 Unspecified place or not applicable: Secondary | ICD-10-CM | POA: Diagnosis not present

## 2018-01-14 MED ORDER — CYCLOBENZAPRINE HCL 10 MG PO TABS
10.0000 mg | ORAL_TABLET | Freq: Once | ORAL | Status: AC
Start: 2018-01-14 — End: 2018-01-14
  Administered 2018-01-14: 10 mg via ORAL
  Filled 2018-01-14: qty 1

## 2018-01-14 MED ORDER — CYCLOBENZAPRINE HCL 10 MG PO TABS: 5.0000 mg | ORAL_TABLET | Freq: Two times a day (BID) | ORAL | 0 refills | Status: DC | PRN

## 2018-01-14 MED ORDER — TRAMADOL HCL 50 MG PO TABS: 50.0000 mg | ORAL_TABLET | Freq: Two times a day (BID) | ORAL | 0 refills | Status: DC | PRN

## 2018-01-14 NOTE — ED Triage Notes (Signed)
Pt to ED c/o fall Sunday night with pain to bilateral shoulders, base of skull, and top of head.  States was stepping backwards and just lost balance.  States on [place].  Denies LOC.

## 2018-01-14 NOTE — ED Provider Notes (Signed)
[facility] Emergency Department Provider Note   ____________________________________________   First MD Initiated Contact with Patient 01/14/18 1312     (approximate)  I have reviewed the triage vital signs and the nursing notes.   HISTORY  Chief Complaint Fall    HPI Ian Sanders is a 77 y.o. male patient complained of headache, bilateral neck pain, and bilateral shoulder pain secondary to a trip and fall.  Patient state he lost his balance and fell backwards and struck the top of his head.  Patient denies LOC.  Patient is a continued headache neck pain.  Patient is on Xarelto.  Patient is a headache was relieved after taking tramadol this morning.  Patient states one pill left over from a previous prescription.  Patient denies vertigo, vision disturbance, or weakness.  Patient rates his head and neck pain as a 7/10.  Patient described his pain as "sore/aching". Past Medical History:  Diagnosis Date  . Atrial fibrillation ([place])   . Benign paroxysmal positional vertigo   . History of chicken pox   . History of measles   . History of mumps   . Mononeuritis of unspecified site   . Other seborrheic keratosis   . Stroke ([place])   . 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 Unspecified hypothyroidism     Patient Active Problem List   Diagnosis Date Noted  . Paroxysmal atrial fibrillation ([place]) 07/14/2017  . Weight loss 10/30/2016  . Encounter for anticoagulation discussion and counseling 10/30/2016  . Cornu cutaneum 06/29/2016  . Laceration of ankle 06/29/2016  . LBP (low back pain) 06/29/2016  . Glossal swelling 06/29/2016  . Aortic atherosclerosis ([place]) 05/05/2016  . Fatty liver 05/05/2016  . Depression 04/17/2016  . Regular alcohol consumption 04/17/2016  . Neuropathy 04/16/2016  . Hernia, inguinal, right 04/16/2016  . Melena 04/16/2016  . Elevated liver function tests 04/16/2016  .  Tobacco abuse 04/16/2016  . Urge incontinence 07/17/2015  . Incontinence 06/20/2015  . Prostate cancer ([place]) 06/20/2015  . Diabetes mellitus ([place]) 12/19/2013  . Groin pain 02/21/2013  . 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 HTN (hypertension) 12/14/2011  . Hypothyroidism 05/13/2010  . Sacroiliac sprain 04/28/2010  . History of CVA (cerebrovascular accident) 03/21/2010  . Headache 03/10/2010  . Benign paroxysmal positional vertigo 01/15/2010  . Insomnia 05/09/2009  . Allergic rhinitis 05/09/2009  . Esophageal reflux 05/09/2009  . 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 Carotid Doppler Ultrasound  04/02/2010   Small amount Calcified plaque bilaterally, no significant stenosis.  . COLONOSCOPY  05/11/2014   Tubular Adenoma. Dr. Allen Norris  . DOPPLER ECHOCARDIOGRAPHY  04/02/2010   Mild left atrial dilation. Normal right atrium. No valvular disease. No thrombus. Normal LV function. EF>55%  . FINGER AMPUTATION    . HERNIA REPAIR    . MRI Brain with and without contrast  03/20/2010   Suggestive of basal ganglia lacunar infarct  . PROSTATECTOMY  1995   Abdominal  . SKIN SURGERY     multiple  . Thumb surgery      Prior to Admission medications   Medication Sig Start Date End Date Taking? Authorizing Provider  atenolol (TENORMIN) 50 MG tablet Take 1 tablet (50 mg total) by mouth daily. 07/15/17   Minna Merritts, MD  BIOTIN PO Take by mouth daily.    [provider]  calcium-vitamin D (OSCAL) 250-125 MG-UNIT per  tablet Take by mouth.    [provider]  chlorthalidone (HYGROTON) 25 MG tablet Take 1 tablet (25 mg total) by mouth daily. 07/15/17   Minna Merritts, MD  Cyanocobalamin (VITAMIN B-12 PO) Take by mouth daily.    [provider]  cyclobenzaprine (FLEXERIL) 10 MG tablet Take 0.5 tablets (5 mg total) by mouth 2 (two) times daily as needed. 01/14/18   Sable Feil, PA-C   Docusate Calcium (STOOL SOFTENER PO) Take by mouth as needed.    [provider]  EPINEPHrine (EPIPEN 2-PAK) 0.3 mg/0.3 mL IJ SOAJ injection Inject 0.3 mg into the muscle. 02/12/15   [provider]  fesoterodine (TOVIAZ) 4 MG TB24 tablet Take 1 tablet (4 mg total) by mouth daily. Patient taking differently: Take 4 mg by mouth daily.  01/20/17   McGowan, Hunt Oris, PA-C  flecainide (TAMBOCOR) 50 MG tablet Take 1 tablet (50 mg total) by mouth 2 (two) times daily. 07/15/17   Minna Merritts, MD  Leuprolide Acetate, 6 Month, (LUPRON) 45 MG injection Inject 45 mg into the muscle every 6 (six) months.    [provider]  omeprazole (PRILOSEC) 40 MG capsule TAKE ONE CAPSULE DAILY. 08/26/16   Birdie Sons, MD  rivaroxaban (XARELTO) 20 MG TABS tablet Take 1 tablet (20 mg total) by mouth daily. 08/21/16   Minna Merritts, MD  traMADol (ULTRAM) 50 MG tablet Take 1 tablet (50 mg total) by mouth every 12 (twelve) hours as needed. 01/14/18   Sable Feil, PA-C  zolpidem (AMBIEN) 10 MG tablet TAKE ONE-HALF TO ONE TABLET AT BEDTIME. 01/12/18   Birdie Sons, MD    Allergies Lisinopril; Myrbetriq [mirabegron]; Atorvastatin; and Dabigatran etexilate mesylate  Family History  Problem Relation Age of Onset  . Heart disease Mother 77       CABG  . Hypertension Mother   . Heart attack Mother   . Hypertension Brother   . Heart disease Sister        stents  . Prostate cancer Brother   . Kidney cancer Brother   . Skin cancer Brother   . Hypertension Brother   . Bladder Cancer Sister   . Leukemia Father   . Stroke Father        multiple  . Cerebral aneurysm Sister   . Kidney cancer Brother     Social History Social History   Tobacco Use  . Smoking status: Former Smoker    Packs/day: 1.00    Years: 12.00    Pack years: 12.00    Last attempt to quit: 10/28/1971    Years since quitting: 46.2  . Smokeless tobacco: Current User    Types: Chew  Substance Use Topics   . Alcohol use: Yes    Alcohol/week: 14.4 oz    Types: 10 Cans of beer, 14 Standard drinks or equivalent per week    Comment: moderate use  . Drug use: No    Review of Systems Constitutional: No fever/chills Eyes: No visual changes. ENT: No sore throat. Cardiovascular: Denies chest pain. Respiratory: Denies shortness of breath. Gastrointestinal: No abdominal pain.  No nausea, no vomiting.  No diarrhea.  No constipation. Genitourinary: Negative for dysuria. Musculoskeletal: Neck pain.   Skin: Negative for rash. Neurological: Positive for headaches, but denies focal weakness or numbness. Psychiatric:Depression Endocrine:Hyperlipidemia and hypertension Allergic/Immunilogical: See medication list ____________________________________________   PHYSICAL EXAM:  VITAL SIGNS: ED Triage Vitals  Enc Vitals Group     BP 01/14/18 1111 Marland Kitchen)  153/66     Pulse Rate 01/14/18 1111 64     Resp 01/14/18 1111 18     Temp 01/14/18 1111 97.7 F (36.5 C)     Temp Source 01/14/18 1111 Oral     SpO2 01/14/18 1111 96 %     Weight 01/14/18 1111 190 lb (86.2 kg)     Height 01/14/18 1111 5\' 10"  (1.778 m)     Head Circumference --      Peak Flow --      Pain Score 01/14/18 1119 7     Pain Loc --      Pain Edu? --      Excl. in [place]? --     Constitutional: Alert and oriented. Well appearing and in no acute distress. Eyes: Conjunctivae are normal. PERRL. EOMI. Head: Atraumatic. Neck: No stridor.  No cervical spine tenderness to palpation.  Decreased range of motion of right lateral movements. Cardiovascular: Normal rate, regular rhythm. Grossly normal heart sounds.  Good peripheral circulation. Respiratory: Normal respiratory effort.  No retractions. Lungs CTAB. Musculoskeletal: No lower extremity tenderness nor edema.  No joint effusions. Neurologic:  Normal speech and language. No gross focal neurologic deficits are appreciated. No gait instability. Skin:  Skin is warm, dry and intact. No rash  noted. Psychiatric: Mood and affect are normal. Speech and behavior are normal.  ____________________________________________   LABS (all labs ordered are listed, but only abnormal results are displayed)  Labs Reviewed - No data to display ____________________________________________  EKG   ____________________________________________  RADIOLOGY  ED MD interpretation:    Official radiology report(s): Ct Head Wo Contrast  Result Date: 01/14/2018 CLINICAL DATA:  Headache and nausea after hitting head on solid object. Vision difficulty. EXAM: CT HEAD WITHOUT CONTRAST TECHNIQUE: Contiguous axial images were obtained from the base of the skull through the vertex without intravenous contrast. COMPARISON:  None. FINDINGS: Brain: There is age related volume loss. There is no intracranial mass, hemorrhage, extra-axial fluid collection, or midline shift. There is mild patchy small vessel disease in the centra semiovale bilaterally. Elsewhere gray-white compartments appear normal. No acute infarct evident. Vascular: There is no appreciable hyperdense vessel. There is calcification in each carotid siphon. Skull: Bony calvarium appears intact. Sinuses/Orbits: There is mucosal thickening in several ethmoid air cells bilaterally. Other visualized paranasal sinuses are clear. There is leftward deviation of the nasal septum. Orbits appear symmetric bilaterally. Other: Mastoid air cells are clear. IMPRESSION: Age related volume loss with mild periventricular small vessel disease. No acute infarct evident. No mass or hemorrhage. There are foci of arterial vascular calcification. There is mucosal thickening in several ethmoid air cells. There is leftward deviation of the nasal septum. Electronically Signed   By: Lowella Grip III M.D.   On: 01/14/2018 12:00    ____________________________________________   PROCEDURES  Procedure(s) performed: None  Procedures  Critical Care performed:  No  ____________________________________________   INITIAL IMPRESSION / ASSESSMENT AND PLAN / ED COURSE  As part of my medical decision making, I reviewed the following data within the [place]   Patient presents with headache and lateral neck pain status post fall 5 days ago.  Discussed with patient CT findings which are unremarkable for injury.  Patient given discharge care instruction advised take medication as needed.  Patient advised to follow-up with PCP.     ____________________________________________   FINAL CLINICAL IMPRESSION(S) / ED DIAGNOSES  Final diagnoses:  Minor head injury, initial encounter  Strain of neck muscle, initial encounter  Fall, initial encounter     ED Discharge Orders        Ordered    traMADol (ULTRAM) 50 MG tablet  Every 12 hours PRN     01/14/18 1325    cyclobenzaprine (FLEXERIL) 10 MG tablet  2 times daily PRN     01/14/18 1325       Note:  This document was prepared using Dragon voice recognition software and may include unintentional dictation errors.    Sable Feil, PA-C 01/14/18 [place], Randall An, MD 01/14/18 1435

## 2018-01-14 NOTE — ED Notes (Signed)
See triage note  states he lost his balance  [place] on Sunday  Has been having pain to head,and back  States he took a tramadol this am   States pain had eased off

## 2018-01-14 NOTE — Telephone Encounter (Signed)
Spoke with pt in reference to -ucx and needing an OV. Pt has an appt on 01/24/18 and asked to discuss hematuria then. Made pt aware that would be ok.

## 2018-01-17 ENCOUNTER — Ambulatory Visit (INDEPENDENT_AMBULATORY_CARE_PROVIDER_SITE_OTHER): Payer: PPO | Admitting: Family Medicine

## 2018-01-17 ENCOUNTER — Other Ambulatory Visit: Payer: Self-pay

## 2018-01-17 ENCOUNTER — Encounter: Payer: Self-pay | Admitting: Family Medicine

## 2018-01-17 VITALS — BP 120/60 | HR 52 | Temp 97.8°F | Resp 16 | Wt 199.0 lb

## 2018-01-17 DIAGNOSIS — S0003XA Contusion of scalp, initial encounter: Secondary | ICD-10-CM

## 2018-01-17 DIAGNOSIS — R252 Cramp and spasm: Secondary | ICD-10-CM

## 2018-01-17 DIAGNOSIS — S161XXS Strain of muscle, fascia and tendon at neck level, sequela: Secondary | ICD-10-CM | POA: Diagnosis not present

## 2018-01-17 MED ORDER — CYCLOBENZAPRINE HCL 10 MG PO TABS: 5.0000 mg | ORAL_TABLET | Freq: Two times a day (BID) | ORAL | 3 refills | Status: DC | PRN

## 2018-01-17 NOTE — Progress Notes (Signed)
Patient: Ian Sanders Male    DOB: 10-24-1941   77 y.o.   MRN: [id number] Visit Date: 01/17/2018  Today's Provider: Lelon Huh, MD   Chief Complaint  Patient presents with  . Hospitalization Follow-up   Subjective:    HPI   Follow up ER visit  Patient was seen in ER for Fall on 01/14/2018.               He was treated for headache, bilateral neck pain,              and bilateral shoulder pain Treatment for this included CT scan which were unremarkable. Patient was given tramadol 50 mg and cyclobenzaprine 10 mg. Patient advised to follow-up with pcp. He reports good compliance with treatment. He reports this condition is Improved.  ---------------------------------------------------------------- Patient states he stills has a headache from fall. Patient states headache still present but greatly improved since ER visit. . Patient states that he is still sore in his shoulders and neck.  Patient also wants to discuss leg cramps. Patient states he has had leg cramps for a long time. However his leg cramps have gotten more frequent lately.. Cramps occur mostly at night. States that cyclobenzaprine that was prescribed for his neck pain at ER was very effective for neck and for helped with leg cramps.     Allergies  Allergen Reactions  . Lisinopril Swelling    Tongue swelling  . Myrbetriq [Mirabegron] Swelling    Of the tongue  . Atorvastatin     Dizziness and Fatigue   . Dabigatran Etexilate Mesylate     Stomach ulcers ([place])     Current Outpatient Medications:  .  atenolol (TENORMIN) 50 MG tablet, Take 1 tablet (50 mg total) by mouth daily., Disp: 90 tablet, Rfl: 3 .  calcium-vitamin D (OSCAL) 250-125 MG-UNIT per tablet, Take by mouth., Disp: , Rfl:  .  chlorthalidone (HYGROTON) 25 MG tablet, Take 1 tablet (25 mg total) by mouth daily., Disp: 90 tablet, Rfl: 3 .  Cyanocobalamin (VITAMIN B-12 PO), Take by mouth daily., Disp: , Rfl:  .  cyclobenzaprine (FLEXERIL)  10 MG tablet, Take 0.5 tablets (5 mg total) by mouth 2 (two) times daily as needed., Disp: 5 tablet, Rfl: 0 .  Docusate Calcium (STOOL SOFTENER PO), Take by mouth as needed., Disp: , Rfl:  .  EPINEPHrine (EPIPEN 2-PAK) 0.3 mg/0.3 mL IJ SOAJ injection, Inject 0.3 mg into the muscle., Disp: , Rfl:  .  fesoterodine (TOVIAZ) 4 MG TB24 tablet, Take 1 tablet (4 mg total) by mouth daily. (Patient taking differently: Take 4 mg by mouth daily. ), Disp: 30 tablet, Rfl: 11 .  flecainide (TAMBOCOR) 50 MG tablet, Take 1 tablet (50 mg total) by mouth 2 (two) times daily., Disp: 180 tablet, Rfl: 3 .  Leuprolide Acetate, 6 Month, (LUPRON) 45 MG injection, Inject 45 mg into the muscle every 6 (six) months., Disp: , Rfl:  .  omeprazole (PRILOSEC) 40 MG capsule, TAKE ONE CAPSULE DAILY., Disp: 90 capsule, Rfl: 4 .  rivaroxaban (XARELTO) 20 MG TABS tablet, Take 1 tablet (20 mg total) by mouth daily., Disp: 30 tablet, Rfl: 11 .  traMADol (ULTRAM) 50 MG tablet, Take 1 tablet (50 mg total) by mouth every 12 (twelve) hours as needed., Disp: 12 tablet, Rfl: 0 .  zolpidem (AMBIEN) 10 MG tablet, TAKE ONE-HALF TO ONE TABLET AT BEDTIME., Disp: 90 tablet, Rfl: 3 .  BIOTIN PO, Take by mouth daily.,  Disp: , Rfl:   Review of Systems  Constitutional: Negative for appetite change, chills and fever.  Respiratory: Negative for chest tightness, shortness of breath and wheezing.   Cardiovascular: Negative for chest pain and palpitations.  Gastrointestinal: Negative for abdominal pain, nausea and vomiting.    Social History   Tobacco Use  . Smoking status: Former Smoker    Packs/day: 1.00    Years: 12.00    Pack years: 12.00    Last attempt to quit: 10/28/1971    Years since quitting: 46.2  . Smokeless tobacco: Current User    Types: Chew  Substance Use Topics  . Alcohol use: Yes    Alcohol/week: 14.4 oz    Types: 10 Cans of beer, 14 Standard drinks or equivalent per week    Comment: moderate use   Objective:   BP  120/60 (BP Location: Right Arm, Patient Position: Sitting, Cuff Size: Large)   Pulse (!) 52   Temp 97.8 F (36.6 C) (Oral)   Resp 16   Wt 199 lb (90.3 kg)   SpO2 98%   BMI 28.55 kg/m  Vitals:   01/17/18 1450  BP: 120/60  Pulse: (!) 52  Resp: 16  Temp: 97.8 F (36.6 C)  TempSrc: Oral  SpO2: 98%  Weight: 199 lb (90.3 kg)     Physical Exam   General Appearance:    Alert, cooperative, no distress  Eyes:    PERRL, conjunctiva/corneas clear, EOM's intact       MS::     Mild tenderness of right trapezius and SCM. Pain at limits of neck rotation.   Heart:    Regular rate and rhythm  Neurologic:   Awake, alert, oriented x 3. No apparent focal neurological           defect. Normal finger to nose, negative rhomberg.   Scalp:   Mild contusion ventral aspect of scalp. No open wounds.        Assessment & Plan:     1. Bilateral leg cramps  - cyclobenzaprine (FLEXERIL) 10 MG tablet; Take 0.5 tablets (5 mg total) by mouth 2 (two) times daily as needed.  Dispense: 30 tablet; Refill: 3  2. Contusion of scalp, initial encounter Secondary to fall with no LOC and no sign of neurologic sequela. Advised to cal or go to ER if he develops any neurological symptoms, somnolence, or worsening headaches.   3. Strain of neck muscle, sequela Secondary to fall above. Is slowly improving. May continue prn acetaminophen and cyclobenzaprine.        Lelon Huh, MD  [facility]

## 2018-01-19 ENCOUNTER — Other Ambulatory Visit: Payer: Self-pay

## 2018-01-19 DIAGNOSIS — C61 Malignant neoplasm of prostate: Secondary | ICD-10-CM

## 2018-01-20 ENCOUNTER — Other Ambulatory Visit: Payer: PPO

## 2018-01-20 DIAGNOSIS — C61 Malignant neoplasm of prostate: Secondary | ICD-10-CM | POA: Diagnosis not present

## 2018-01-21 LAB — PSA

## 2018-01-24 ENCOUNTER — Encounter: Payer: Self-pay | Admitting: Urology

## 2018-01-24 ENCOUNTER — Ambulatory Visit: Payer: PPO | Admitting: Urology

## 2018-01-24 VITALS — BP 147/76 | HR 60 | Ht 70.0 in | Wt 187.0 lb

## 2018-01-24 DIAGNOSIS — N3941 Urge incontinence: Secondary | ICD-10-CM | POA: Diagnosis not present

## 2018-01-24 DIAGNOSIS — R3129 Other microscopic hematuria: Secondary | ICD-10-CM | POA: Diagnosis not present

## 2018-01-24 DIAGNOSIS — C61 Malignant neoplasm of prostate: Secondary | ICD-10-CM | POA: Diagnosis not present

## 2018-01-24 MED ORDER — LEUPROLIDE ACETATE (6 MONTH) 45 MG IM KIT
45.0000 mg | PACK | Freq: Once | INTRAMUSCULAR | Status: AC
Start: 2018-01-24 — End: 2018-01-24
  Administered 2018-01-24: 45 mg via INTRAMUSCULAR

## 2018-01-24 NOTE — Progress Notes (Signed)
Lupron IM Injection   Due to Prostate Cancer patient is present today for a Lupron Injection.  Medication: Lupron 6 month Dose: 45 mg  Location: left upper outer buttocks Lot: [id number] Exp: 04/14/2020  Patient tolerated well, no complications were noted  Performed by: Fonnie Jarvis, CMA  Follow up: 70month

## 2018-01-24 NOTE — Progress Notes (Signed)
01/24/2018 2:37 PM   Ian Sanders 09-09-1941 [id number]  Referring provider: Birdie Sons, [address] [place], [place] [address]  Chief Complaint  Patient presents with  . Follow-up    HPI: Mr. Kubicek is a 76 year old Caucasian with prostate cancer and incontinence who presents today for his 6 month Lupron and recheck.    Prostate cancer: Patient underwent RRP by Dr. Domingo Cocking at [facility] in 1995. His PSA remained below 1 until 2014 when it rose to 9.8 ng/mL. He was started on ADT with [place] in 03/15. He had received Firmagon for 4 months and then was switched to a longer acting agent, Lupron.  He is taking 600mg  of calcium twice daily and vitamin d 400mg  twice daily. He reports a good appetite, no weight loss and no back pain. He has not had any recent fevers, chills, nausea and/or vomiting.  His last PSA was <0.1 ng/mL on 01/20/2018.  He is no longer experiencing hot flashes.    Incontinence: Patient's urinary symptoms are stable.  He has not had dysuria, fevers, chills, nausea or vomiting. He also denies suprapubic pain. He was seeing some improvement with Myrbetriq, but he experienced tongue swelling and discontinued the medication.  He was then switched to Lisbeth Ply and feels this medication is effective.  He did have to stop the medication due to dry eyes.  His current IPSS score is 9/3.  His previous IPSS score  was 11/3.  His PVR today is 130 mL.   IPSS    Row Name 01/24/18 1300         International Prostate Symptom Score   How often have you had the sensation of not emptying your bladder?  Not at All     How often have you had to urinate less than every two hours?  Not at All     How often have you found you stopped and started again several times when you urinated?  More than half the time     How often have you found it difficult to postpone urination?  Less than 1 in 5 times     How often have you had a weak urinary stream?  Less than 1 in 5 times       How often have you had to strain to start urination?  Not at All     How many times did you typically get up at night to urinate?  3 Times     Total IPSS Score  9       Quality of Life due to urinary symptoms   If you were to spend the rest of your life with your urinary condition just the way it is now how would you feel about that?  Mixed        Score:  1-7 Mild 8-19 Moderate 20-35 Severe  Microscopic hematuria He presented to the office on 01/05/2018 complaining of frequency, urgency, dysuria, incontinence and lower abdominal pain.  A clean catch UA was positive 11-30 RBC's.  Urine culture was negative.    PMH: Past Medical History:  Diagnosis Date  . Atrial fibrillation ([place])   . Benign paroxysmal positional vertigo   . History of chicken pox   . History of measles   . History of mumps   . Mononeuritis of unspecified site   . Other seborrheic keratosis   . Stroke ([place])   . 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  Unspecified hypothyroidism     Surgical History: Past Surgical History:  Procedure Laterality Date  . Carotid Doppler Ultrasound  04/02/2010   Small amount Calcified plaque bilaterally, no significant stenosis.  . COLONOSCOPY  05/11/2014   Tubular Adenoma. Dr. Allen Norris  . DOPPLER ECHOCARDIOGRAPHY  04/02/2010   Mild left atrial dilation. Normal right atrium. No valvular disease. No thrombus. Normal LV function. EF>55%  . FINGER AMPUTATION    . HERNIA REPAIR    . MRI Brain with and without contrast  03/20/2010   Suggestive of basal ganglia lacunar infarct  . PROSTATECTOMY  1995   Abdominal  . SKIN SURGERY     multiple  . Thumb surgery      Home Medications:  Allergies as of 01/24/2018      Reactions   Lisinopril Swelling   Tongue swelling   Myrbetriq [mirabegron] Swelling   Of the tongue   Atorvastatin    Dizziness and Fatigue   Dabigatran Etexilate Mesylate    Stomach ulcers (Pradaxa)       Medication List        Accurate as of 01/24/18  2:37 PM. Always use your most recent med list.          atenolol 50 MG tablet Commonly known as:  TENORMIN Take 1 tablet (50 mg total) by mouth daily.   BIOTIN PO Take by mouth daily.   calcium-vitamin D 250-125 MG-UNIT tablet Commonly known as:  OSCAL Take by mouth.   chlorthalidone 25 MG tablet Commonly known as:  HYGROTON Take 1 tablet (25 mg total) by mouth daily.   cyclobenzaprine 10 MG tablet Commonly known as:  FLEXERIL Take 0.5 tablets (5 mg total) by mouth 2 (two) times daily as needed.   EPIPEN 2-PAK 0.3 mg/0.3 mL Soaj injection Generic drug:  EPINEPHrine Inject 0.3 mg into the muscle.   fesoterodine 4 MG Tb24 tablet Commonly known as:  TOVIAZ Take 1 tablet (4 mg total) by mouth daily.   flecainide 50 MG tablet Commonly known as:  TAMBOCOR Take 1 tablet (50 mg total) by mouth 2 (two) times daily.   Leuprolide Acetate (6 Month) 45 MG injection Commonly known as:  LUPRON Inject 45 mg into the muscle every 6 (six) months.   omeprazole 40 MG capsule Commonly known as:  PRILOSEC TAKE ONE CAPSULE DAILY.   rivaroxaban 20 MG Tabs tablet Commonly known as:  XARELTO Take 1 tablet (20 mg total) by mouth daily.   STOOL SOFTENER PO Take by mouth as needed.   traMADol 50 MG tablet Commonly known as:  ULTRAM Take 1 tablet (50 mg total) by mouth every 12 (twelve) hours as needed.   VITAMIN B-12 PO Take by mouth daily.   zolpidem 10 MG tablet Commonly known as:  AMBIEN TAKE ONE-HALF TO ONE TABLET AT BEDTIME.       Allergies:  Allergies  Allergen Reactions  . Lisinopril Swelling    Tongue swelling  . Myrbetriq [Mirabegron] Swelling    Of the tongue  . Atorvastatin     Dizziness and Fatigue   . Dabigatran Etexilate Mesylate     Stomach ulcers (Pradaxa)    Family History: Family History  Problem Relation Age of Onset  . Heart disease Mother 64       CABG  . Hypertension Mother   . Heart  attack Mother   . Hypertension Brother   . Heart disease Sister        stents  . Prostate cancer Brother   .  Kidney cancer Brother   . Skin cancer Brother   . Hypertension Brother   . Bladder Cancer Sister   . Leukemia Father   . Stroke Father        multiple  . Cerebral aneurysm Sister   . Kidney cancer Brother     Social History:  reports that he quit smoking about 46 years ago. He has a 12.00 pack-year smoking history. His smokeless tobacco use includes chew. He reports that he drinks about 14.4 oz of alcohol per week. He reports that he does not use drugs.  ROS: UROLOGY Frequent Urination?: No Hard to postpone urination?: No Burning/pain with urination?: No Get up at night to urinate?: No Leakage of urine?: No Urine stream starts and stops?: Yes Trouble starting stream?: No Do you have to strain to urinate?: No Blood in urine?: No Urinary tract infection?: No Sexually transmitted disease?: No Injury to kidneys or bladder?: No Painful intercourse?: No Weak stream?: No Erection problems?: No Penile pain?: No  Gastrointestinal Nausea?: No Vomiting?: No Indigestion/heartburn?: No Diarrhea?: No Constipation?: Yes  Constitutional Fever: No Night sweats?: No Weight loss?: No Fatigue?: No  Skin Skin rash/lesions?: No Itching?: No  Eyes Blurred vision?: No Double vision?: No  Ears/Nose/Throat Sore throat?: No Sinus problems?: No  Hematologic/Lymphatic Swollen glands?: No Easy bruising?: No  Cardiovascular Leg swelling?: No Chest pain?: No  Respiratory Cough?: No Shortness of breath?: No  Endocrine Excessive thirst?: Yes  Musculoskeletal Back pain?: Yes Joint pain?: No  Neurological Headaches?: No Dizziness?: No  Psychologic Depression?: No Anxiety?: No  Physical Exam: BP (!) 147/76   Pulse 60   Ht 5\' 10"  (1.778 m)   Wt 187 lb (84.8 kg)   BMI 26.83 kg/m   Constitutional: Well nourished. Alert and oriented, No acute  distress. HEENT: [place] AT, moist mucus membranes. Trachea midline, no masses. Cardiovascular: No clubbing, cyanosis, or edema. Respiratory: Normal respiratory effort, no increased work of breathing. Skin: No rashes, bruises or suspicious lesions. Lymph: No cervical or inguinal adenopathy. Neurologic: Grossly intact, no focal deficits, moving all 4 extremities. Psychiatric: Normal mood and affect.  Laboratory Data: Lab Results  Component Value Date   CREATININE 0.97 04/20/2017   Lab Results  Component Value Date   HGBA1C 5.4 04/20/2017      Component Value Date/Time   CHOL 115 04/20/2017 1119   HDL 31 (L) 04/20/2017 1119   CHOLHDL 3.7 04/20/2017 1119   [place] 40 04/20/2017 1119    Lab Results  Component Value Date   AST 27 04/20/2017   Lab Results  Component Value Date   ALT 13 04/20/2017     Assessment & Plan:    1. Prostate cancer:   Patient underwent RRP in 1995. His PSA remained below 1 until 2014 when it rose to 9.8 ng/mL. He was started on ADT and his PSA returned to <0.1ng/mL. He will RTC in 6 months for lupron, PSA and exam.    - Leuprolide Acetate (6 Month) (LUPRON) injection 45 mg; Inject 45 mg into the muscle once.  2. Incontinence: Patient experienced tongue swelling while on the Myrbetriq and dry eyes on anticholinergics.  He is not bothered by the incontinence at this time.  3. Microscopic hematuria  - I explained to the patient that there are a number of causes that can be associated with blood in the urine, such as stones,  BPH, UTI's, damage to the urinary tract and/or cancer.  - At this time, I felt that the patient  warranted further urologic evaluation.   The AUA guidelines state that a CT urogram is the preferred imaging study to evaluate hematuria.  - I explained to the patient that a contrast material will be injected into a vein and that in rare instances, an allergic reaction can result and may even life threatening   The patient denies any  allergies to contrast, iodine and/or seafood and is not taking metformin.  - Following the imaging study,  I've recommended a cystoscopy. I described how this is performed, typically in an office setting with a flexible cystoscope. We described the risks, benefits, and possible side effects, the most common of which is a minor amount of blood in the urine and/or burning which usually resolves in 24 to 48 hours.    - The patient had the opportunity to ask questions which were answered. Based upon this discussion, the patient is willing to proceed. Therefore, I've ordered: a CT Urogram and cystoscopy.  - The patient will return following all of the above for discussion of the results.   - UA  - Urine culture  - BUN + creatinine    Return for CT Urogram report and cystoscopy.  These notes generated with voice recognition software. I apologize for typographical errors.  Zara Council, [facility] [address] [place], [place] [address] [phone]

## 2018-01-31 ENCOUNTER — Ambulatory Visit
Admission: RE | Admit: 2018-01-31 | Discharge: 2018-01-31 | Disposition: A | Discharge status: Home / Self Care | Payer: PPO | Source: Ambulatory Visit | Attending: Urology | Admitting: Urology

## 2018-01-31 DIAGNOSIS — N289 Disorder of kidney and ureter, unspecified: Secondary | ICD-10-CM | POA: Diagnosis not present

## 2018-01-31 DIAGNOSIS — R161 Splenomegaly, not elsewhere classified: Secondary | ICD-10-CM | POA: Diagnosis not present

## 2018-01-31 DIAGNOSIS — R3129 Other microscopic hematuria: Secondary | ICD-10-CM | POA: Diagnosis not present

## 2018-01-31 DIAGNOSIS — K8020 Calculus of gallbladder without cholecystitis without obstruction: Secondary | ICD-10-CM | POA: Diagnosis not present

## 2018-01-31 DIAGNOSIS — R911 Solitary pulmonary nodule: Secondary | ICD-10-CM | POA: Insufficient documentation

## 2018-01-31 DIAGNOSIS — K766 Portal hypertension: Secondary | ICD-10-CM | POA: Insufficient documentation

## 2018-01-31 HISTORY — DX: Malignant (primary) neoplasm, unspecified: C80.1

## 2018-01-31 HISTORY — DX: Essential (primary) hypertension: I10

## 2018-01-31 LAB — POCT I-STAT CREATININE: Creatinine, Ser: 1 mg/dL (ref 0.61–1.24)

## 2018-01-31 MED ORDER — IOPAMIDOL (ISOVUE-300) INJECTION 61%
125.0000 mL | Freq: Once | INTRAVENOUS | Status: AC | PRN
  Administered 2018-01-31: 125 mL via INTRAVENOUS

## 2018-02-09 ENCOUNTER — Ambulatory Visit: Payer: PPO | Admitting: Urology

## 2018-02-09 ENCOUNTER — Encounter: Payer: Self-pay | Admitting: Urology

## 2018-02-09 VITALS — BP 144/81 | HR 65 | Resp 16 | Ht 70.0 in | Wt 193.8 lb

## 2018-02-09 DIAGNOSIS — R3129 Other microscopic hematuria: Secondary | ICD-10-CM | POA: Diagnosis not present

## 2018-02-09 LAB — URINALYSIS, COMPLETE
Bilirubin, UA: NEGATIVE
GLUCOSE, UA: NEGATIVE
Ketones, UA: NEGATIVE
LEUKOCYTES UA: NEGATIVE
Nitrite, UA: NEGATIVE
Protein, UA: NEGATIVE
RBC, UA: NEGATIVE
Specific Gravity, UA: <1.005 — ABNORMAL LOW (ref 1.005–1.030)
Urobilinogen, Ur: 0.2 mg/dL (ref 0.2–1.0)
pH, UA: 6 (ref 5.0–7.5)

## 2018-02-09 MED ORDER — CIPROFLOXACIN HCL 500 MG PO TABS: 500.0000 mg | ORAL_TABLET | Freq: Once | ORAL | Status: DC

## 2018-02-09 MED ORDER — LIDOCAINE HCL 2 % EX GEL: 1.0000 "application " | Freq: Once | CUTANEOUS | Status: DC

## 2018-02-09 NOTE — Progress Notes (Signed)
   02/09/18  CC:  Chief Complaint  Patient presents with  . Cysto    HPI: Refer to [employer] note of 01/24/2018.  CT urogram showed a 12 mm exophytic lesion of the right kidney with possible enhancement and a 9 mm left renal lesion with possible enhancement.  Blood pressure (!) 144/81, pulse 65, resp. rate 16, height 5\' 10"  (1.778 m), weight 193 lb 12.8 oz (87.9 kg), SpO2 98 %. NED. A&Ox3.   No respiratory distress   Abd soft, NT, ND Normal phallus with bilateral descended testicles  Cystoscopy Procedure Note  Patient identification was confirmed, informed consent was obtained, and patient was prepped using Betadine solution.  Lidocaine jelly was administered per urethral meatus.    Preoperative abx where received prior to procedure.     Pre-Procedure: - Inspection reveals a normal caliber ureteral meatus.  Procedure: The flexible cystoscope was introduced without difficulty - No urethral strictures/lesions are present. - Enlarged prostate with hypervascularity - moderate  bladder neck elevation - Bilateral ureteral orifices identified - Bladder mucosa  reveals no ulcers, tumors, or lesions - No bladder stones - No trabeculation  Retroflexion shows no abnormalities   Post-Procedure: - Patient tolerated the procedure well  Assessment/ Plan: CT urogram with 2 indeterminate lesions which are small.  An MRI of the abdomen was recommended by radiology.  We will plan to schedule a follow-up MRI in 3 months.  No significant abnormalities on cystoscopy noted.

## 2018-02-18 ENCOUNTER — Other Ambulatory Visit: Payer: Self-pay | Admitting: Family Medicine

## 2018-03-03 DIAGNOSIS — L570 Actinic keratosis: Secondary | ICD-10-CM | POA: Diagnosis not present

## 2018-04-22 LAB — CBC AND DIFFERENTIAL
HEMATOCRIT: 38 — AB (ref 41–53)
Hemoglobin: 12.5 — AB (ref 13.5–17.5)
PLATELETS: 57 — AB (ref 150–399)
WBC: 2.4

## 2018-04-22 LAB — BASIC METABOLIC PANEL
Creatinine: 1.1 (ref 0.6–1.3)
POTASSIUM: 3.8 (ref 3.4–5.3)
Sodium: 139 (ref 137–147)

## 2018-04-22 LAB — HEPATIC FUNCTION PANEL
ALT: 32 (ref 10–40)
AST: 35 (ref 14–40)
Alkaline Phosphatase: 81 (ref 25–125)

## 2018-04-22 LAB — POCT INR: INR: 1.9 — AB (ref 0.9–1.1)

## 2018-05-02 ENCOUNTER — Ambulatory Visit (INDEPENDENT_AMBULATORY_CARE_PROVIDER_SITE_OTHER): Payer: PPO

## 2018-05-02 ENCOUNTER — Ambulatory Visit (INDEPENDENT_AMBULATORY_CARE_PROVIDER_SITE_OTHER): Payer: PPO | Admitting: Family Medicine

## 2018-05-02 VITALS — BP 132/56 | HR 64 | Temp 98.4°F | Ht 70.0 in | Wt 198.4 lb

## 2018-05-02 DIAGNOSIS — Z0000 Encounter for general adult medical examination without abnormal findings: Secondary | ICD-10-CM

## 2018-05-02 DIAGNOSIS — Z1211 Encounter for screening for malignant neoplasm of colon: Secondary | ICD-10-CM

## 2018-05-02 DIAGNOSIS — Z86010 Personal history of colonic polyps: Secondary | ICD-10-CM

## 2018-05-02 DIAGNOSIS — M79672 Pain in left foot: Secondary | ICD-10-CM | POA: Diagnosis not present

## 2018-05-02 DIAGNOSIS — I480 Paroxysmal atrial fibrillation: Secondary | ICD-10-CM

## 2018-05-02 DIAGNOSIS — E118 Type 2 diabetes mellitus with unspecified complications: Secondary | ICD-10-CM

## 2018-05-02 DIAGNOSIS — M79671 Pain in right foot: Secondary | ICD-10-CM

## 2018-05-02 LAB — POCT GLYCOSYLATED HEMOGLOBIN (HGB A1C)
Est. average glucose Bld gHb Est-mCnc: 131
Hemoglobin A1C: 6.2 % — AB (ref 4.0–5.6)

## 2018-05-02 NOTE — Patient Instructions (Signed)
   Try OTC vitamin B12 1,000 units for numbness and burning in feet

## 2018-05-02 NOTE — Progress Notes (Signed)
Patient: Ian Sanders, Male    DOB: December 05, 1940, 77 y.o.   MRN: [id number] Visit Date: 05/02/2018  Today's Provider: Lelon Huh, MD   Chief Complaint  Patient presents with  . Annual Exam  . Diabetes  . Hypertension  . Atrial Fibrillation   Subjective:   Patient saw McKenzie for AWV today at 1:40 pm.   Complete Physical Ian Sanders is a 77 y.o. male. He feels fairly well. He reports no regular exercising. He reports he is sleeping poorly.  -----------------------------------------------------------   Diabetes Mellitus Type II, Follow-up:   Lab Results  Component Value Date   HGBA1C 5.4 04/20/2017   HGBA1C 6.3 (H) 05/21/2016   HGBA1C 5.8 04/23/2015   Last seen for diabetes 1 years ago.  Management since then includes; no changes. He reports good compliance with treatment. He is not having side effects.  Current symptoms include none and have been stable. Home blood sugar records: blood sugars are not being checked  Episodes of hypoglycemia? no   Current Insulin Regimen: none Most Recent Eye Exam: <1 year ago Weight trend: fluctuating a bit Prior visit with dietician: no Current diet: well balanced Current exercise: none  ------------------------------------------------------------------------   Hypertension, follow-up:  BP Readings from Last 3 Encounters:  05/02/18 (!) 132/56  02/09/18 (!) 144/81  01/24/18 (!) 147/76    He was last seen for hypertension 1 years ago.  BP at that visit was 126/58. Management since that visit includes; no changes.He reports good compliance with treatment. He is not having side effects.  He is not exercising. He is adherent to low salt diet.   Outside blood pressures are checked occasionally. He is experiencing none.  Patient denies chest pain, chest pressure/discomfort, claudication, dyspnea, exertional chest pressure/discomfort, irregular heart beat, lower extremity edema, near-syncope, orthopnea,  palpitations, paroxysmal nocturnal dyspnea, syncope and tachypnea.   Cardiovascular risk factors include advanced age (older than 63 for men, 14 for women), diabetes mellitus, hypertension and male gender.  Use of agents associated with hypertension: none.   ------------------------------------------------------------------------  Atrial fibrillation, currently in sinus rhythm From 04/27/2017-no changes. Patient reports good compliance with treatment. He has been followed by Dr. Rockey Situ and maintained in SR on flecainide and atenolol. He has not felt any palpitations for a long period of time. He prefers to be followed by a different cardiologist. He does consume 2 mixed drinks every evening, but never more than that.   Prostate cancer ([place]) From 04/27/2017-no changes. Continue regular follow up with urology.    Review of Systems  Constitutional: Positive for fatigue. Negative for appetite change, chills and fever.  HENT: Positive for sinus pressure and tinnitus. Negative for congestion, ear pain, hearing loss, nosebleeds and trouble swallowing.   Eyes: Negative for pain and visual disturbance.  Respiratory: Negative for cough, chest tightness and shortness of breath.   Cardiovascular: Negative for chest pain, palpitations and leg swelling.  Gastrointestinal: Positive for constipation. Negative for abdominal pain, blood in stool, diarrhea, nausea and vomiting.  Endocrine: Negative for polydipsia, polyphagia and polyuria.  Genitourinary: Positive for hematuria. Negative for dysuria and flank pain.  Musculoskeletal: Positive for arthralgias. Negative for back pain, joint swelling, myalgias and neck stiffness.  Skin: Negative for color change, rash and wound.  Neurological: Negative for dizziness, tremors, seizures, speech difficulty, weakness, light-headedness and headaches.  Psychiatric/Behavioral: Positive for decreased concentration. Negative for behavioral problems, confusion, dysphoric mood  and sleep disturbance. The patient is not nervous/anxious.   All other  systems reviewed and are negative.   Social History   Socioeconomic History  . Marital status: Married    Spouse name: Not on file  . Number of children: 2  . Years of education: Not on file  . Highest education level: Associate degree: occupational, Hotel manager, or vocational program  Occupational History  . Occupation: Retired  Scientific laboratory technician  . Financial resource strain: Not hard at all  . Food insecurity:    Worry: Never true    Inability: Never true  . Transportation needs:    Medical: No    Non-medical: No  Tobacco Use  . Smoking status: Former Smoker    Packs/day: 1.00    Years: 12.00    Pack years: 12.00    Last attempt to quit: 10/28/1971    Years since quitting: 46.5  . Smokeless tobacco: Current User    Types: Chew  Substance and Sexual Activity  . Alcohol use: Yes    Alcohol/week: 6.0 oz    Types: 10 Cans of beer per week    Comment: moderate use  . Drug use: No  . Sexual activity: Not on file  Lifestyle  . Physical activity:    Days per week: Not on file    Minutes per session: Not on file  . Stress: Not at all  Relationships  . Social connections:    Talks on phone: Not on file    Gets together: Not on file    Attends religious service: Not on file    Active member of club or organization: Not on file    Attends meetings of clubs or organizations: Not on file    Relationship status: Not on file  . Intimate partner violence:    Fear of current or ex partner: Not on file    Emotionally abused: Not on file    Physically abused: Not on file    Forced sexual activity: Not on file  Other Topics Concern  . Not on file  Social History Narrative  . Not on file    Past Medical History:  Diagnosis Date  . Atrial fibrillation ([place])   . Benign paroxysmal positional vertigo   . Cancer ([place])   . History of chicken pox   . History of measles   . History of mumps   . Hypertension   .  Mononeuritis of unspecified site   . Other seborrheic keratosis   . Stroke ([place])   . 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 Unspecified hypothyroidism      Patient Active Problem List   Diagnosis Date Noted  . Paroxysmal atrial fibrillation ([place]) 07/14/2017  . Weight loss 10/30/2016  . Encounter for anticoagulation discussion and counseling 10/30/2016  . Cornu cutaneum 06/29/2016  . Laceration of ankle 06/29/2016  . LBP (low back pain) 06/29/2016  . Glossal swelling 06/29/2016  . Aortic atherosclerosis ([place]) 05/05/2016  . Fatty liver 05/05/2016  . Depression 04/17/2016  . Regular alcohol consumption 04/17/2016  . Neuropathy 04/16/2016  . Hernia, inguinal, right 04/16/2016  . Melena 04/16/2016  . Elevated liver function tests 04/16/2016  . Tobacco abuse 04/16/2016  . Urge incontinence 07/17/2015  . Incontinence 06/20/2015  . Prostate cancer ([place]) 06/20/2015  . Diabetes mellitus ([place]) 12/19/2013  . Groin pain 02/21/2013  . 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 HTN (hypertension) 12/14/2011  . Hypothyroidism 05/13/2010  . Sacroiliac sprain 04/28/2010  . History of CVA (cerebrovascular accident) 03/21/2010  .  Headache 03/10/2010  . Benign paroxysmal positional vertigo 01/15/2010  . Insomnia 05/09/2009  . Allergic rhinitis 05/09/2009  . Esophageal reflux 05/09/2009  . 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 Carotid Doppler Ultrasound  04/02/2010   Small amount Calcified plaque bilaterally, no significant stenosis.  . COLONOSCOPY  05/11/2014   Tubular Adenoma. Dr. Allen Norris  . DOPPLER ECHOCARDIOGRAPHY  04/02/2010   Mild left atrial dilation. Normal right atrium. No valvular disease. No thrombus. Normal LV function. EF>55%  . FINGER AMPUTATION    . HERNIA REPAIR    . MRI Brain with and without contrast  03/20/2010   Suggestive of basal  ganglia lacunar infarct  . PROSTATECTOMY  1995   Abdominal  . SKIN SURGERY     multiple  . Thumb surgery      His family history includes Bladder Cancer in his sister; Cerebral aneurysm in his sister; Heart attack in his mother; Heart disease in his sister; Heart disease (age of onset: 56) in his mother; Hypertension in his brother, brother, and mother; Kidney cancer in his brother and brother; Leukemia in his father; Prostate cancer in his brother; Skin cancer in his brother; Stroke in his father.      Current Outpatient Medications:  .  atenolol (TENORMIN) 50 MG tablet, Take 1 tablet (50 mg total) by mouth daily., Disp: 90 tablet, Rfl: 3 .  BIOTIN PO, Take by mouth daily., Disp: , Rfl:  .  calcium-vitamin D (OSCAL) 250-125 MG-UNIT per tablet, Take 1 tablet by mouth 2 (two) times daily. , Disp: , Rfl:  .  chlorthalidone (HYGROTON) 25 MG tablet, Take 1 tablet (25 mg total) by mouth daily. (Patient taking differently: Take 12.5 mg by mouth daily. ), Disp: 90 tablet, Rfl: 3 .  Cyanocobalamin (VITAMIN B-12 PO), Take by mouth daily., Disp: , Rfl:  .  cyclobenzaprine (FLEXERIL) 10 MG tablet, Take 0.5 tablets (5 mg total) by mouth 2 (two) times daily as needed., Disp: 30 tablet, Rfl: 3 .  Docusate Calcium (STOOL SOFTENER PO), Take by mouth as needed., Disp: , Rfl:  .  EPINEPHrine (EPIPEN 2-PAK) 0.3 mg/0.3 mL IJ SOAJ injection, Inject 0.3 mg into the muscle once. , Disp: , Rfl:  .  fesoterodine (TOVIAZ) 4 MG TB24 tablet, Take 1 tablet (4 mg total) by mouth daily. (Patient not taking: Reported on 05/02/2018), Disp: 30 tablet, Rfl: 11 .  flecainide (TAMBOCOR) 50 MG tablet, Take 1 tablet (50 mg total) by mouth 2 (two) times daily., Disp: 180 tablet, Rfl: 3 .  Leuprolide Acetate, 6 Month, (LUPRON) 45 MG injection, Inject 45 mg into the muscle every 6 (six) months., Disp: , Rfl:  .  omeprazole (PRILOSEC) 40 MG capsule, TAKE ONE CAPSULE DAILY., Disp: 90 capsule, Rfl: 0 .  rivaroxaban (XARELTO) 20 MG TABS  tablet, Take 1 tablet (20 mg total) by mouth daily., Disp: 30 tablet, Rfl: 11 .  traMADol (ULTRAM) 50 MG tablet, Take 1 tablet (50 mg total) by mouth every 12 (twelve) hours as needed. (Patient not taking: Reported on 05/02/2018), Disp: 12 tablet, Rfl: 0 .  zolpidem (AMBIEN) 10 MG tablet, TAKE ONE-HALF TO ONE TABLET AT BEDTIME., Disp: 90 tablet, Rfl: 3  Current Facility-Administered Medications:  .  ciprofloxacin (CIPRO) tablet 500 mg, 500 mg, Oral, Once, Stoioff, Scott C, MD .  lidocaine (XYLOCAINE) 2 % jelly 1 application, 1 application, Urethral, Once, Stoioff, Ronda Fairly, MD  Patient Care Team: Birdie Sons, MD as  PCP - General (Family Medicine) Minna Merritts, MD as Consulting Physician (Cardiology) Nori Riis, PA-C as Physician Assistant (Urology)     Objective:    Vitals:   BP 132/56 (BP Location: Right Arm)   Pulse 64   Temp 98.4 F (36.9 C) (Oral)   Ht 5\' 10"  (1.778 m)   Wt 198 lb 6.4 oz (90 kg)   BMI 28.47 kg/m   BSA 2.11 m           Physical Exam   General Appearance:    Alert, cooperative, no distress, appears stated age  Head:    Normocephalic, without obvious abnormality, atraumatic  Eyes:    PERRL, conjunctiva/corneas clear, EOM's intact, fundi    benign, both eyes       Ears:    Normal TM's and external ear canals, both ears  Nose:   Nares normal, septum midline, mucosa normal, no drainage   or sinus tenderness  Throat:   Lips, mucosa, and tongue normal; teeth and gums normal  Neck:   Supple, symmetrical, trachea midline, no adenopathy;       thyroid:  No enlargement/tenderness/nodules; no carotid   bruit or JVD  Back:     Symmetric, no curvature, ROM normal, no CVA tenderness  Lungs:     Clear to auscultation bilaterally, respirations unlabored  Chest wall:    No tenderness or deformity  Heart:    Regular rate and rhythm, S1 and S2 normal, no murmur, rub   or gallop  Abdomen:     Soft, non-tender, bowel sounds active all four  quadrants,    no masses, no organomegaly  Genitalia:    deferred  Rectal:    deferred  Extremities:   Extremities normal, atraumatic, no cyanosis or edema  Pulses:   2+ and symmetric all extremities  Skin:   Skin color, texture, turgor normal, no rashes or lesions  Lymph nodes:   Cervical, supraclavicular, and axillary nodes normal  Neurologic:   CNII-XII intact. Normal strength, sensation and reflexes      throughout    Activities of Daily Living In your present state of health, do you have any difficulty performing the following activities: 05/02/2018  Hearing? N  Vision? N  Difficulty concentrating or making decisions? Y  Walking or climbing stairs? Y  Comment Due to foot pain.   Dressing or bathing? N  Doing errands, shopping? N  Preparing Food and eating ? N  Using the Toilet? N  In the past six months, have you accidently leaked urine? N  Do you have problems with loss of bowel control? N  Managing your Medications? N  Managing your Finances? N  Housekeeping or managing your Housekeeping? N  Some recent data might be hidden    Fall Risk Assessment Fall Risk  05/02/2018 04/20/2017 04/17/2016  Falls in the past year? Yes No No  Number falls in past yr: 1 - -  Injury with Fall? No - -  Follow up Falls prevention discussed - -     Depression Screen PHQ 2/9 Scores 05/02/2018 05/02/2018 04/20/2017 04/20/2017  PHQ - 2 Score 1 1 1 1   PHQ- 9 Score 6 - 9 -   Audit-C Alcohol Use Screening   Alcohol Use Disorder Test (AUDIT) 05/02/2018  1. How often do you have a drink containing alcohol? 4  2. How many drinks containing alcohol do you have on a typical day when you are drinking? 0  3. How often do you have  six or more drinks on one occasion? 0  AUDIT-C Score 4  4. How often during the last year have you found that you were not able to stop drinking once you had started? 0  5. How often during the last year have you failed to do what was normally expected from you becasue of  drinking? 0  6. How often during the last year have you needed a first drink in the morning to get yourself going after a heavy drinking session? 0  7. How often during the last year have you had a feeling of guilt of remorse after drinking? 0  8. How often during the last year have you been unable to remember what happened the night before because you had been drinking? 0  9. Have you or someone else been injured as a result of your drinking? 4  10. Has a relative or friend or a doctor or another health worker been concerned about your drinking or suggested you cut down? 4  Alcohol Use Disorder Identification Test Final Score (AUDIT) 12  Intervention/Follow-up Continued Monitoring    A score of 3 or more in women, and 4 or more in men indicates increased risk for alcohol abuse, EXCEPT if all of the points are from question 1    Assessment & Plan:    Annual Physical Reviewed patient's Family Medical History Reviewed and updated list of patient's medical providers Assessment of cognitive impairment was done Assessed patient's functional ability Established a written schedule for health screening [place] Completed and Reviewed  Exercise Activities and Dietary recommendations Goals    . DIET - INCREASE WATER INTAKE     Recommend increasing water intake to 4-6 glasses a day.     . Exercise 3x per week (30 min per time)     Recommend increasing exercise. Pt to work up to walking 3 days a week for 30 minutes.        Immunization History  Administered Date(s) Administered  . Influenza, High Dose Seasonal PF 08/09/2015, 07/23/2016  . Pneumococcal Conjugate-13 12/15/2013  . Pneumococcal Polysaccharide-23 11/16/2006  . Td 05/29/2016    Health Maintenance  Topic Date Due  . COLONOSCOPY  05/09/2017  . HEMOGLOBIN A1C  10/20/2017  . OPHTHALMOLOGY EXAM  01/14/2018  . FOOT EXAM  04/27/2018  . URINE MICROALBUMIN  04/27/2018  . INFLUENZA VACCINE  06/16/2018  .  TETANUS/TDAP  05/29/2026  . PNA vac Low Risk Adult  Completed     Discussed health benefits of physical activity, and encouraged him to engage in regular exercise appropriate for his age and condition.    Results for orders placed or performed in visit on 05/02/18  POCT glycosylated hemoglobin (Hb A1C)  Result Value Ref Range   Hemoglobin A1C 6.2 (A) 4.0 - 5.6 %   HbA1c, POC (prediabetic range)  5.7 - 6.4 %   HbA1c, POC (controlled diabetic range)  0.0 - 7.0 %   Est. average glucose Bld gHb Est-mCnc 131     ------------------------------------------------------------------------------------------------------------  1. Annual physical exam Generally doing well.   2. Paroxysmal atrial fibrillation (HCC) SR maintained on flecainide and atenolol. He is due for follow up with cardiology and requests referral to a different one. He is concerned about cost of Xarelto since he will no longer be going to [place] for his medications.  - Ambulatory referral to Cardiology  3. History of adenomatous polyp of colon Due for follow up colonoscopy.  - Ambulatory referral to Gastroenterology  4. Type 2 diabetes mellitus with complication, without long-term current use of insulin (HCC) Well controlled.  Continue current diet.  - POCT glycosylated hemoglobin (Hb A1C)  5. Pain in both feet  - Ambulatory referral to [place], MD  [facility]

## 2018-05-02 NOTE — Progress Notes (Signed)
Subjective:   Ian Sanders is a 77 y.o. male who presents for Medicare Annual/Subsequent preventive examination.  Review of Systems:  N/A  Cardiac Risk Factors include: advanced age (>66men, >74 women);hypertension;male gender;dyslipidemia     Objective:    Vitals: BP (!) 132/56 (BP Location: Right Arm)   Pulse 64   Temp 98.4 F (36.9 C) (Oral)   Ht 5\' 10"  (1.778 m)   Wt 198 lb 6.4 oz (90 kg)   BMI 28.47 kg/m   Body mass index is 28.47 kg/m.  Advanced Directives 05/02/2018 01/14/2018 04/20/2017 04/02/2015  Does Patient Have a Medical Advance Directive? Yes Yes Yes Yes  Type of Paramedic of [place];Living will [place];Living will [place];Living will Living will  Does patient want to make changes to medical advance directive? - - - No - Patient declined  Copy of [place] in Chart? Yes No - copy requested No - copy requested -    Tobacco Social History   Tobacco Use  Smoking Status Former Smoker  . Packs/day: 1.00  . Years: 12.00  . Pack years: 12.00  . Last attempt to quit: 10/28/1971  . Years since quitting: 46.5  Smokeless Tobacco Current User  . Types: Chew     Ready to quit: Not Answered Counseling given: Not Answered   Clinical Intake:  Pre-visit preparation completed: Yes  Pain : 0-10 Pain Score: 5  Pain Type: Chronic pain Pain Location: Foot Pain Orientation: Right, Left Pain Descriptors / Indicators: Burning     Nutritional Status: BMI 25 -29 Overweight Nutritional Risks: None Diabetes: No  How often do you need to have someone help you when you read instructions, pamphlets, or other written materials from your doctor or pharmacy?: 1 - Never  Interpreter Needed?: No  Information entered by :: [facility], LPN  Past Medical History:  Diagnosis Date  . Atrial fibrillation ([place])   . Benign paroxysmal positional vertigo   . Cancer ([place])   . History of  chicken pox   . History of measles   . History of mumps   . Hypertension   . Mononeuritis of unspecified site   . Other seborrheic keratosis   . Stroke ([place])   . 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 Unspecified hypothyroidism    Past Surgical History:  Procedure Laterality Date  . Carotid Doppler Ultrasound  04/02/2010   Small amount Calcified plaque bilaterally, no significant stenosis.  . COLONOSCOPY  05/11/2014   Tubular Adenoma. Dr. Allen Norris  . DOPPLER ECHOCARDIOGRAPHY  04/02/2010   Mild left atrial dilation. Normal right atrium. No valvular disease. No thrombus. Normal LV function. EF>55%  . FINGER AMPUTATION    . HERNIA REPAIR    . MRI Brain with and without contrast  03/20/2010   Suggestive of basal ganglia lacunar infarct  . PROSTATECTOMY  1995   Abdominal  . SKIN SURGERY     multiple  . Thumb surgery     Family History  Problem Relation Age of Onset  . Heart disease Mother 89       CABG  . Hypertension Mother   . Heart attack Mother   . Hypertension Brother   . Heart disease Sister        stents  . Prostate cancer Brother   . Kidney cancer Brother   . Skin cancer Brother   . Hypertension Brother   . Bladder Cancer Sister   .  Leukemia Father   . Stroke Father        multiple  . Cerebral aneurysm Sister   . Kidney cancer Brother    Social History   Socioeconomic History  . Marital status: Married    Spouse name: Not on file  . Number of children: 2  . Years of education: Not on file  . Highest education level: Associate degree: occupational, Hotel manager, or vocational program  Occupational History  . Occupation: Retired  Scientific laboratory technician  . Financial resource strain: Not hard at all  . Food insecurity:    Worry: Never true    Inability: Never true  . Transportation needs:    Medical: No    Non-medical: No  Tobacco Use  . Smoking status: Former Smoker    Packs/day: 1.00    Years: 12.00    Pack  years: 12.00    Last attempt to quit: 10/28/1971    Years since quitting: 46.5  . Smokeless tobacco: Current User    Types: Chew  Substance and Sexual Activity  . Alcohol use: Yes    Alcohol/week: 6.0 oz    Types: 10 Cans of beer per week    Comment: moderate use  . Drug use: No  . Sexual activity: Not on file  Lifestyle  . Physical activity:    Days per week: Not on file    Minutes per session: Not on file  . Stress: Not at all  Relationships  . Social connections:    Talks on phone: Not on file    Gets together: Not on file    Attends religious service: Not on file    Active member of club or organization: Not on file    Attends meetings of clubs or organizations: Not on file    Relationship status: Not on file  Other Topics Concern  . Not on file  Social History Narrative  . Not on file    Outpatient Encounter Medications as of 05/02/2018  Medication Sig  . atenolol (TENORMIN) 50 MG tablet Take 1 tablet (50 mg total) by mouth daily.  Marland Kitchen BIOTIN PO Take by mouth daily.  . calcium-vitamin D (OSCAL) 250-125 MG-UNIT per tablet Take 1 tablet by mouth 2 (two) times daily.   . chlorthalidone (HYGROTON) 25 MG tablet Take 1 tablet (25 mg total) by mouth daily. (Patient taking differently: Take 12.5 mg by mouth daily. )  . Cyanocobalamin (VITAMIN B-12 PO) Take by mouth daily.  . cyclobenzaprine (FLEXERIL) 10 MG tablet Take 0.5 tablets (5 mg total) by mouth 2 (two) times daily as needed.  Mariane Baumgarten Calcium (STOOL SOFTENER PO) Take by mouth as needed.  Marland Kitchen EPINEPHrine (EPIPEN 2-PAK) 0.3 mg/0.3 mL IJ SOAJ injection Inject 0.3 mg into the muscle once.   . flecainide (TAMBOCOR) 50 MG tablet Take 1 tablet (50 mg total) by mouth 2 (two) times daily.  Marland Kitchen Leuprolide Acetate, 6 Month, (LUPRON) 45 MG injection Inject 45 mg into the muscle every 6 (six) months.  Marland Kitchen omeprazole (PRILOSEC) 40 MG capsule TAKE ONE CAPSULE DAILY.  . rivaroxaban (XARELTO) 20 MG TABS tablet Take 1 tablet (20 mg total) by  mouth daily.  Marland Kitchen zolpidem (AMBIEN) 10 MG tablet TAKE ONE-HALF TO ONE TABLET AT BEDTIME.  . fesoterodine (TOVIAZ) 4 MG TB24 tablet Take 1 tablet (4 mg total) by mouth daily. (Patient not taking: Reported on 05/02/2018)  . traMADol (ULTRAM) 50 MG tablet Take 1 tablet (50 mg total) by mouth every 12 (twelve) hours as needed. (  Patient not taking: Reported on 05/02/2018)   Facility-Administered Encounter Medications as of 05/02/2018  Medication  . ciprofloxacin (CIPRO) tablet 500 mg  . lidocaine (XYLOCAINE) 2 % jelly 1 application    Activities of Daily Living In your present state of health, do you have any difficulty performing the following activities: 05/02/2018  Hearing? N  Vision? N  Difficulty concentrating or making decisions? Y  Walking or climbing stairs? Y  Comment Due to foot pain.   Dressing or bathing? N  Doing errands, shopping? N  Preparing Food and eating ? N  Using the Toilet? N  In the past six months, have you accidently leaked urine? N  Do you have problems with loss of bowel control? N  Managing your Medications? N  Managing your Finances? N  Housekeeping or managing your Housekeeping? N  Some recent data might be hidden    Patient Care Team: Birdie Sons, MD as PCP - General (Family Medicine) Rockey Situ, Kathlene November, MD as Consulting Physician (Cardiology) Laneta Simmers as Physician Assistant (Urology)   Assessment:   This is a routine wellness examination for Ian Sanders.  Exercise Activities and Dietary recommendations Current Exercise Habits: The patient does not participate in regular exercise at present, Exercise limited by: None identified  Goals    . DIET - INCREASE WATER INTAKE     Recommend increasing water intake to 4-6 glasses a day.     . Exercise 3x per week (30 min per time)     Recommend increasing exercise. Pt to work up to walking 3 days a week for 30 minutes.        Fall Risk Fall Risk  05/02/2018 04/20/2017 04/17/2016  Falls in the  past year? Yes No No  Number falls in past yr: 1 - -  Injury with Fall? No - -  Follow up Falls prevention discussed - -   Is the patient's home free of loose throw rugs in walkways, pet beds, electrical cords, etc?   yes      Grab bars in the bathroom? yes      Handrails on the stairs?   yes      Adequate lighting?   yes  Timed Get Up and Go Performed: N/A  Depression Screen PHQ 2/9 Scores 05/02/2018 05/02/2018 04/20/2017 04/20/2017  PHQ - 2 Score 1 1 1 1   PHQ- 9 Score 6 - 9 -    Cognitive Function     6CIT Screen 05/02/2018 04/20/2017  What Year? 0 points 0 points  What month? 0 points 0 points  What time? 0 points 0 points  Count back from 20 0 points 0 points  Months in reverse 2 points 2 points  Repeat phrase 4 points 0 points  Total Score 6 2    Immunization History  Administered Date(s) Administered  . Influenza, High Dose Seasonal PF 08/09/2015, 07/23/2016  . Pneumococcal Conjugate-13 12/15/2013  . Pneumococcal Polysaccharide-23 11/16/2006  . Td 05/29/2016    Qualifies for Shingles Vaccine? Due for Shingles vaccine. Declined my offer to administer today. Education has been provided regarding the importance of this vaccine. Pt has been advised to call her insurance company to determine her out of pocket expense. Advised she may also receive this vaccine at her local pharmacy or Health Dept. Verbalized acceptance and understanding.  Screening Tests Health Maintenance  Topic Date Due  . COLONOSCOPY  05/09/2017  . HEMOGLOBIN A1C  10/20/2017  . OPHTHALMOLOGY EXAM  01/14/2018  . FOOT EXAM  04/27/2018  . URINE MICROALBUMIN  04/27/2018  . INFLUENZA VACCINE  06/16/2018  . TETANUS/TDAP  05/29/2026  . PNA vac Low Risk Adult  Completed   Cancer Screenings: Lung: Low Dose CT Chest recommended if Age 58-80 years, 30 pack-year currently smoking OR have quit w/in 15years. Patient does not qualify. Colorectal: Currently due, referral sent today.   Additional Screenings:    Hepatitis C Screening: N/A      Plan:  I have personally reviewed and addressed the Medicare Annual Wellness questionnaire and have noted the following in the patient's chart:  A. Medical and social history B. Use of alcohol, tobacco or illicit drugs  C. Current medications and supplements D. Functional ability and status E.  Nutritional status F.  Physical activity G. Advance directives H. List of other physicians I.  Hospitalizations, surgeries, and ER visits in previous 12 months J.  [place] such as hearing and vision if needed, cognitive and depression L. Referrals and appointments - none  In addition, I have reviewed and discussed with patient certain preventive protocols, quality metrics, and best practice recommendations. A written personalized care plan for preventive services as well as general preventive health recommendations were provided to patient.  See attached scanned questionnaire for additional information.   Signed,  Fabio Neighbors, LPN Nurse Health Advisor   Nurse Recommendations: Pt needs his Hgb A1c checked today, a foot exam and a urine microalbumin checked today. A referral for a colonoscopy sent.

## 2018-05-02 NOTE — Patient Instructions (Signed)
Ian Sanders , Thank you for taking time to come for your Medicare Wellness Visit. I appreciate your ongoing commitment to your health goals. Please review the following plan we discussed and let me know if I can assist you in the future.   Screening recommendations/referrals: Colonoscopy: Referral sent today.  Recommended yearly ophthalmology/optometry visit for glaucoma screening and checkup Recommended yearly dental visit for hygiene and checkup  Vaccinations: Influenza vaccine: Up to date Pneumococcal vaccine: Up to date Tdap vaccine: Up to date Shingles vaccine: Pt declines today.     Advanced directives: Already on file.   Conditions/risks identified: Recommend increasing water intake to 4-6 glasses a day.   Next appointment: 2:00 PM today with Dr Caryn Section.   Preventive Care 78 Years and Older, Male Preventive care refers to lifestyle choices and visits with your health care provider that can promote health and wellness. What does preventive care include?  A yearly physical exam. This is also called an annual well check.  Dental exams once or twice a year.  Routine eye exams. Ask your health care provider how often you should have your eyes checked.  Personal lifestyle choices, including:  Daily care of your teeth and gums.  Regular physical activity.  Eating a healthy diet.  Avoiding tobacco and drug use.  Limiting alcohol use.  Practicing safe sex.  Taking low doses of aspirin every day.  Taking vitamin and mineral supplements as recommended by your health care provider. What happens during an annual well check? The services and screenings done by your health care provider during your annual well check will depend on your age, overall health, lifestyle risk factors, and family history of disease. Counseling  Your health care provider may ask you questions about your:  Alcohol use.  Tobacco use.  Drug use.  Emotional well-being.  Home and relationship  well-being.  Sexual activity.  Eating habits.  History of falls.  Memory and ability to understand (cognition).  Work and work Statistician. Screening  You may have the following tests or measurements:  Height, weight, and BMI.  Blood pressure.  Lipid and cholesterol levels. These may be checked every 5 years, or more frequently if you are over 78 years old.  Skin check.  Lung cancer screening. You may have this screening every year starting at age 78 if you have a 30-pack-year history of smoking and currently smoke or have quit within the past 15 years.  Fecal occult blood test (FOBT) of the stool. You may have this test every year starting at age 78.  Flexible sigmoidoscopy or colonoscopy. You may have a sigmoidoscopy every 5 years or a colonoscopy every 10 years starting at age 78.  Prostate cancer screening. Recommendations will vary depending on your family history and other risks.  Hepatitis C blood test.  Hepatitis B blood test.  Sexually transmitted disease (STD) testing.  Diabetes screening. This is done by checking your blood sugar (glucose) after you have not eaten for a while (fasting). You may have this done every 1-3 years.  Abdominal aortic aneurysm (AAA) screening. You may need this if you are a current or former smoker.  Osteoporosis. You may be screened starting at age 78 if you are at high risk. Talk with your health care provider about your test results, treatment options, and if necessary, the need for more tests. Vaccines  Your health care provider may recommend certain vaccines, such as:  Influenza vaccine. This is recommended every year.  Tetanus, diphtheria, and acellular pertussis (  Tdap, Td) vaccine. You may need a Td booster every 10 years.  Zoster vaccine. You may need this after age 78.  Pneumococcal 13-valent conjugate (PCV13) vaccine. One dose is recommended after age 78.  Pneumococcal polysaccharide (PPSV23) vaccine. One dose is  recommended after age 78. Talk to your health care provider about which screenings and vaccines you need and how often you need them. This information is not intended to replace advice given to you by your health care provider. Make sure you discuss any questions you have with your health care provider. Document Released: 11/29/2015 Document Revised: 07/22/2016 Document Reviewed: 09/03/2015 Elsevier Interactive Patient Education  2017 [place] Prevention in the Home Falls can cause injuries. They can happen to people of all ages. There are many things you can do to make your home safe and to help prevent falls. What can I do on the outside of my home?  Regularly fix the edges of walkways and driveways and fix any cracks.  Remove anything that might make you trip as you walk through a door, such as a raised step or threshold.  Trim any bushes or trees on the path to your home.  Use bright outdoor lighting.  Clear any walking paths of anything that might make someone trip, such as rocks or tools.  Regularly check to see if handrails are loose or broken. Make sure that both sides of any steps have handrails.  Any raised decks and porches should have guardrails on the edges.  Have any leaves, snow, or ice cleared regularly.  Use sand or salt on walking paths during winter.  Clean up any spills in your garage right away. This includes oil or grease spills. What can I do in the bathroom?  Use night lights.  Install grab bars by the toilet and in the tub and shower. Do not use towel bars as grab bars.  Use non-skid mats or decals in the tub or shower.  If you need to sit down in the shower, use a plastic, non-slip stool.  Keep the floor dry. Clean up any water that spills on the floor as soon as it happens.  Remove soap buildup in the tub or shower regularly.  Attach bath mats securely with double-sided non-slip rug tape.  Do not have throw rugs and other things on  the floor that can make you trip. What can I do in the bedroom?  Use night lights.  Make sure that you have a light by your bed that is easy to reach.  Do not use any sheets or blankets that are too big for your bed. They should not hang down onto the floor.  Have a firm chair that has side arms. You can use this for support while you get dressed.  Do not have throw rugs and other things on the floor that can make you trip. What can I do in the kitchen?  Clean up any spills right away.  Avoid walking on wet floors.  Keep items that you use a lot in easy-to-reach places.  If you need to reach something above you, use a strong step stool that has a grab bar.  Keep electrical cords out of the way.  Do not use floor polish or wax that makes floors slippery. If you must use wax, use non-skid floor wax.  Do not have throw rugs and other things on the floor that can make you trip. What can I do with my stairs?  Do  not leave any items on the stairs.  Make sure that there are handrails on both sides of the stairs and use them. Fix handrails that are broken or loose. Make sure that handrails are as long as the stairways.  Check any carpeting to make sure that it is firmly attached to the stairs. Fix any carpet that is loose or worn.  Avoid having throw rugs at the top or bottom of the stairs. If you do have throw rugs, attach them to the floor with carpet tape.  Make sure that you have a light switch at the top of the stairs and the bottom of the stairs. If you do not have them, ask someone to add them for you. What else can I do to help prevent falls?  Wear shoes that:  Do not have high heels.  Have rubber bottoms.  Are comfortable and fit you well.  Are closed at the toe. Do not wear sandals.  If you use a stepladder:  Make sure that it is fully opened. Do not climb a closed stepladder.  Make sure that both sides of the stepladder are locked into place.  Ask someone to  hold it for you, if possible.  Clearly mark and make sure that you can see:  Any grab bars or handrails.  First and last steps.  Where the edge of each step is.  Use tools that help you move around (mobility aids) if they are needed. These include:  Canes.  Walkers.  Scooters.  Crutches.  Turn on the lights when you go into a dark area. Replace any light bulbs as soon as they burn out.  Set up your furniture so you have a clear path. Avoid moving your furniture around.  If any of your floors are uneven, fix them.  If there are any pets around you, be aware of where they are.  Review your medicines with your doctor. Some medicines can make you feel dizzy. This can increase your chance of falling. Ask your doctor what other things that you can do to help prevent falls. This information is not intended to replace advice given to you by your health care provider. Make sure you discuss any questions you have with your health care provider. Document Released: 08/29/2009 Document Revised: 04/09/2016 Document Reviewed: 12/07/2014 Elsevier Interactive Patient Education  2017 [employer].

## 2018-05-04 ENCOUNTER — Other Ambulatory Visit: Payer: Self-pay

## 2018-05-05 ENCOUNTER — Telehealth: Payer: Self-pay | Admitting: Cardiovascular Disease

## 2018-05-05 ENCOUNTER — Other Ambulatory Visit: Payer: Self-pay

## 2018-05-05 DIAGNOSIS — Z86010 Personal history of colonic polyps: Secondary | ICD-10-CM

## 2018-05-05 NOTE — Telephone Encounter (Signed)
° °  [facility] Pre-operative Risk Assessment    Request for surgical clearance:  1. What type of surgery is being performed? colonoscopy  2. When is this surgery scheduled? 05-23-18  3. What type of clearance is required (medical clearance vs. Pharmacy clearance to hold med vs. Both)?pharmacy  4. Are there any medications that need to be held prior to surgery and how long? xarelto please advise  5. Practice name and name of physician performing surgery? [facility]  6. What is your office phone number [phone]   7.   What is your office fax number [phone]   8.   Anesthesia type (None, local, MAC, general) ? Unknown    Ian Sanders 05/05/2018, 4:58 PM  _________________________________________________________________   (provider comments below)

## 2018-05-08 NOTE — Telephone Encounter (Signed)
Acceptable risk to have GI procedure Would hold xarelto 2 days prior to procedure

## 2018-05-09 ENCOUNTER — Telehealth: Payer: Self-pay

## 2018-05-09 NOTE — Telephone Encounter (Signed)
Routed to number provided via EPIC fax.  

## 2018-05-09 NOTE — Telephone Encounter (Signed)
Advised patient medication clearance & holding instructions received from Dr. Rockey Situ.  Stop Xarelto: 2 days prior Restart: 1 day following  Patient acknowledged.

## 2018-05-09 NOTE — Progress Notes (Signed)
05/10/2018 2:10 PM   Ian Sanders 05-05-1941 [id number]  Referring provider: Birdie Sanders, Ian Sanders, Ian Sanders [id number]  Chief Complaint  Patient presents with  . Hematuria    51month    HPI: Ian Sanders is a 77 year old Caucasian with prostate cancer, incontinence and a history of hematuria who presents today for follow up.    History of hematuria CTU completed on 01/31/2018 noted a 1.2 cm partially exophytic lesion within the inferior pole of the right kidney with suggestion of mild contrast enhancement, raising the possibility of solid renal neoplasm.  There is a 9 mm lesion within the medial superior left kidney with suggestion of possible associated enhancement.  Recommend further evaluation of these lesions with MRI or short-term follow-up pre and post contrast-enhanced CT.  No nephroureterolithiasis. No hydronephrosis. No abnormal filling defects within the opacified renal collecting systems and ureters.  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  Prominent vascularity and stranding lateral to the ascending colon favored to be sequelae of portal venous hypertension.  Recommend clinical correlation for signs of colitis.  Cholelithiasis.  Bilateral femoral head AVN.  There is a 5 mm right lung nodule. No follow-up needed if patient is low-risk. Non-contrast chest CT can be considered in 12 months if patient is high-risk.  Cystoscopy on 02/09/2018 with Dr. Bernardo Sanders was negative.  He is due for a follow up MRI at this time.  He has not experienced any flank pain or gross hematuria.    Prostate cancer: Patient underwent RRP by Dr. Domingo Sanders at [facility] in 1995. His PSA remained below 1 until 2014 when it rose to 9.8 ng/mL. He was started on ADT with [place] in 03/15. He had received Firmagon for 4 months and then was switched to a longer acting agent, Lupron.  He is taking 600mg  of  calcium twice daily and vitamin d 400mg  twice daily. He reports a good appetite, no weight loss and no back pain. He has not had any recent fevers, chills, nausea and/or vomiting.  His last PSA was <0.1 ng/mL on 01/20/2018.  He is no longer experiencing hot flashes.    Incontinence: Patient's urinary symptoms are stable.  He has not had dysuria, fevers, chills, nausea or vomiting. He also denies suprapubic pain. He was seeing some improvement with Myrbetriq, but he experienced tongue swelling and discontinued the medication.  He was then switched to Lisbeth Ply and feels this medication is effective.  He did have to stop the medication due to dry eyes.  His current IPSS score is 9/3.  His previous IPSS score  was 11/3.  His PVR was 130 mL.  He is not interested in any further treatments at this time.       PMH: Past Medical History:  Diagnosis Date  . Atrial fibrillation ([place])   . Benign paroxysmal positional vertigo   . Cancer ([place])   . History of chicken pox   . History of measles   . History of mumps   . Hypertension   . Mononeuritis of unspecified site   . Other seborrheic keratosis   . Stroke ([place])   . 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 Unspecified hypothyroidism     Surgical History: Past Surgical History:  Procedure Laterality Date  . Carotid Doppler Ultrasound  04/02/2010   Small amount Calcified plaque bilaterally, no significant stenosis.  Marland Kitchen  COLONOSCOPY  05/11/2014   Tubular Adenoma. Dr. Allen Sanders  . DOPPLER ECHOCARDIOGRAPHY  04/02/2010   Mild left atrial dilation. Normal right atrium. No valvular disease. No thrombus. Normal LV function. EF>55%  . FINGER AMPUTATION    . HERNIA REPAIR    . MRI Brain with and without contrast  03/20/2010   Suggestive of basal ganglia lacunar infarct  . PROSTATECTOMY  1995   Abdominal  . SKIN SURGERY     multiple  . Thumb surgery      Home Medications:  Allergies as of  05/10/2018      Reactions   Lisinopril Swelling   Tongue swelling Tongue swelling    Myrbetriq [mirabegron] Swelling   Of the tongue   Atorvastatin    Dizziness and Fatigue   Dabigatran Etexilate Mesylate    Stomach ulcers (Pradaxa)      Medication List        Accurate as of 05/10/18  2:10 PM. Always use your most recent med list.          atenolol 50 MG tablet Commonly known as:  TENORMIN Take 1 tablet (50 mg total) by mouth daily.   BIOTIN PO Take by mouth daily.   calcium-vitamin D 250-125 MG-UNIT tablet Commonly known as:  OSCAL Take 1 tablet by mouth 2 (two) times daily.   chlorthalidone 25 MG tablet Commonly known as:  HYGROTON Take 1 tablet (25 mg total) by mouth daily.   cyclobenzaprine 10 MG tablet Commonly known as:  FLEXERIL Take 0.5 tablets (5 mg total) by mouth 2 (two) times daily as needed.   EPIPEN 2-PAK 0.3 mg/0.3 mL Soaj injection Generic drug:  EPINEPHrine Inject 0.3 mg into the muscle once.   flecainide 50 MG tablet Commonly known as:  TAMBOCOR Take 1 tablet (50 mg total) by mouth 2 (two) times daily.   Leuprolide Acetate (6 Month) 45 MG injection Commonly known as:  LUPRON Inject 45 mg into the muscle every 6 (six) months.   omeprazole 40 MG capsule Commonly known as:  PRILOSEC TAKE ONE CAPSULE DAILY.   rivaroxaban 20 MG Tabs tablet Commonly known as:  XARELTO Take 1 tablet (20 mg total) by mouth daily.   STOOL SOFTENER PO Take by mouth as needed.   VITAMIN B-12 PO Take by mouth daily.   zolpidem 10 MG tablet Commonly known as:  AMBIEN TAKE ONE-HALF TO ONE TABLET AT BEDTIME.       Allergies:  Allergies  Allergen Reactions  . Lisinopril Swelling    Tongue swelling Tongue swelling   . Myrbetriq [Mirabegron] Swelling    Of the tongue  . Atorvastatin     Dizziness and Fatigue   . Dabigatran Etexilate Mesylate     Stomach ulcers (Pradaxa)    Family History: Family History  Problem Relation Age of Onset  . Heart  disease Mother 25       CABG  . Hypertension Mother   . Heart attack Mother   . Hypertension Brother   . Heart disease Sister        stents  . Prostate cancer Brother   . Kidney cancer Brother   . Skin cancer Brother   . Hypertension Brother   . Bladder Cancer Sister   . Leukemia Father   . Stroke Father        multiple  . Cerebral aneurysm Sister   . Kidney cancer Brother     Social History:  reports that he quit smoking about 46 years ago.  He has a 12.00 pack-year smoking history. His smokeless tobacco use includes chew. He reports that he drinks about 6.0 oz of alcohol per week. He reports that he does not use drugs.  ROS: UROLOGY Frequent Urination?: No Hard to postpone urination?: Yes Burning/pain with urination?: No Get up at night to urinate?: No Leakage of urine?: No Urine stream starts and stops?: No Trouble starting stream?: No Do you have to strain to urinate?: No Blood in urine?: Yes Urinary tract infection?: No Sexually transmitted disease?: No Injury to kidneys or bladder?: No Painful intercourse?: No Weak stream?: No Erection problems?: No Penile pain?: No  Gastrointestinal Nausea?: No Vomiting?: No Indigestion/heartburn?: No Diarrhea?: No Constipation?: No  Constitutional Fever: No Night sweats?: No Weight loss?: No Fatigue?: No  Skin Skin rash/lesions?: No Itching?: No  Eyes Blurred vision?: No Double vision?: No  Ears/Nose/Throat Sore throat?: No Sinus problems?: No  Hematologic/Lymphatic Swollen glands?: No Easy bruising?: No  Cardiovascular Leg swelling?: No Chest pain?: No  Respiratory Cough?: No Shortness of breath?: No  Endocrine Excessive thirst?: No  Musculoskeletal Back pain?: No Joint pain?: No  Neurological Headaches?: No Dizziness?: No  Psychologic Depression?: No Anxiety?: No  Physical Exam: BP 136/72   Pulse 61   Ht 5\' 10"  (1.778 m)   Wt 195 lb (88.5 kg)   BMI 27.98 kg/m     Constitutional: Well nourished. Alert and oriented, No acute distress. HEENT: [place] AT, moist mucus membranes. Trachea midline, no masses. Cardiovascular: No clubbing, cyanosis, or edema. Respiratory: Normal respiratory effort, no increased work of breathing. Skin: No rashes, bruises or suspicious lesions. Lymph: No cervical or inguinal adenopathy. Neurologic: Grossly intact, no focal deficits, moving all 4 extremities. Psychiatric: Normal mood and affect.   Laboratory Data: Lab Results  Component Value Date   CREATININE 1.1 04/22/2018   Lab Results  Component Value Date   HGBA1C 6.2 (A) 05/02/2018      Component Value Date/Time   CHOL 115 04/20/2017 1119   HDL 31 (L) 04/20/2017 1119   CHOLHDL 3.7 04/20/2017 1119   [place] 40 04/20/2017 1119    Lab Results  Component Value Date   AST 35 04/22/2018   Lab Results  Component Value Date   ALT 32 04/22/2018   I have reviewed the labs.   Assessment & Plan:    1. History of hematuria Hematuria work up noted bilateral renal lesions suspicious for RCC - will need follow up MRI at this time MRI is order - will call patient will results   2. Prostate cancer:   Patient underwent RRP in 1995. His PSA remained below 1 until 2014 when it rose to 9.8 ng/mL. He was started on ADT and his PSA returned to <0.1ng/mL. He will RTC in 6 months for lupron, PSA and exam.  (06/2018)  3. Incontinence: Patient experienced tongue swelling while on the Myrbetriq and dry eyes on anticholinergics.  He is not bothered by the incontinence at this time.    Return in about 2 months (around 07/10/2018) for PSA and exam.  These notes generated with voice recognition software. I apologize for typographical errors.  Zara Council, PA-C  [facility] [address] [place], [place] [address] [phone]

## 2018-05-10 ENCOUNTER — Ambulatory Visit: Payer: PPO | Admitting: Urology

## 2018-05-10 ENCOUNTER — Encounter: Payer: Self-pay | Admitting: Urology

## 2018-05-10 VITALS — BP 136/72 | HR 61 | Ht 70.0 in | Wt 195.0 lb

## 2018-05-10 DIAGNOSIS — N2889 Other specified disorders of kidney and ureter: Secondary | ICD-10-CM

## 2018-05-10 DIAGNOSIS — Z87448 Personal history of other diseases of urinary system: Secondary | ICD-10-CM | POA: Diagnosis not present

## 2018-05-10 DIAGNOSIS — N393 Stress incontinence (female) (male): Secondary | ICD-10-CM | POA: Diagnosis not present

## 2018-05-10 DIAGNOSIS — C61 Malignant neoplasm of prostate: Secondary | ICD-10-CM

## 2018-05-11 LAB — BUN+CREAT
BUN/Creatinine Ratio: 9 — ABNORMAL LOW (ref 10–24)
BUN: 10 mg/dL (ref 8–27)
CREATININE: 1.07 mg/dL (ref 0.76–1.27)
GFR calc Af Amer: 77 mL/min/{1.73_m2} (ref >59)
GFR, EST NON AFRICAN AMERICAN: 67 mL/min/{1.73_m2} (ref >59)

## 2018-05-13 DIAGNOSIS — I10 Essential (primary) hypertension: Secondary | ICD-10-CM | POA: Diagnosis not present

## 2018-05-13 DIAGNOSIS — I480 Paroxysmal atrial fibrillation: Secondary | ICD-10-CM | POA: Diagnosis not present

## 2018-05-13 DIAGNOSIS — Z8673 Personal history of transient ischemic attack (TIA), and cerebral infarction without residual deficits: Secondary | ICD-10-CM | POA: Diagnosis not present

## 2018-05-13 DIAGNOSIS — E781 Pure hyperglyceridemia: Secondary | ICD-10-CM | POA: Diagnosis not present

## 2018-05-13 DIAGNOSIS — Z79899 Other long term (current) drug therapy: Secondary | ICD-10-CM | POA: Diagnosis not present

## 2018-05-16 ENCOUNTER — Telehealth: Payer: Self-pay | Admitting: Gastroenterology

## 2018-05-16 NOTE — Telephone Encounter (Signed)
Patient LVM to call him regarding his procedure.

## 2018-05-16 NOTE — Telephone Encounter (Signed)
Patient Ian Sanders and  has some procedure questions, please call.

## 2018-05-17 ENCOUNTER — Telehealth: Payer: Self-pay

## 2018-05-17 ENCOUNTER — Ambulatory Visit
Admission: RE | Admit: 2018-05-17 | Discharge: 2018-05-17 | Disposition: A | Discharge status: Home / Self Care | Payer: PPO | Source: Ambulatory Visit | Attending: Urology | Admitting: Urology

## 2018-05-17 DIAGNOSIS — K8020 Calculus of gallbladder without cholecystitis without obstruction: Secondary | ICD-10-CM | POA: Insufficient documentation

## 2018-05-17 DIAGNOSIS — K7460 Unspecified cirrhosis of liver: Secondary | ICD-10-CM | POA: Diagnosis not present

## 2018-05-17 DIAGNOSIS — N281 Cyst of kidney, acquired: Secondary | ICD-10-CM | POA: Diagnosis not present

## 2018-05-17 DIAGNOSIS — K760 Fatty (change of) liver, not elsewhere classified: Secondary | ICD-10-CM | POA: Diagnosis not present

## 2018-05-17 DIAGNOSIS — I700 Atherosclerosis of aorta: Secondary | ICD-10-CM | POA: Diagnosis not present

## 2018-05-17 DIAGNOSIS — R161 Splenomegaly, not elsewhere classified: Secondary | ICD-10-CM | POA: Insufficient documentation

## 2018-05-17 DIAGNOSIS — N2889 Other specified disorders of kidney and ureter: Secondary | ICD-10-CM | POA: Diagnosis not present

## 2018-05-17 MED ORDER — GADOBENATE DIMEGLUMINE 529 MG/ML IV SOLN
20.0000 mL | Freq: Once | INTRAVENOUS | Status: AC | PRN
  Administered 2018-05-17: 18 mL via INTRAVENOUS

## 2018-05-17 NOTE — Telephone Encounter (Signed)
Pt has been advised to hold Xarelto 2 days prior to procedure and restart once colonoscopy has been done at the advise of GI doctor.

## 2018-05-17 NOTE — Telephone Encounter (Signed)
-----   Message from Minna Merritts, MD sent at 05/08/2018 10:37 PM EDT -----  Madaline Brilliant to hold xarelto for 2 days prior to procedure Restart when clear by GI thx TGollan   ----- Message ----- From: Glennie Isle, CMA Sent: 05/05/2018   4:05 PM To: Minna Merritts, MD

## 2018-05-17 NOTE — Telephone Encounter (Signed)
Patient has been advised to stop blood thinner 2 days before his colonoscopy and restart 1 day after his colonoscopy.  Begin drinking bowel prep at 5pm evening before colonoscopy drinking 8 oz every 20-30 min until he completes all the liquid.  Thanks [employer]

## 2018-05-17 NOTE — Telephone Encounter (Signed)
Patient calling again with questions regarding procedure this Monday 7.8.19 and would like to speak with someone.

## 2018-05-18 ENCOUNTER — Telehealth: Payer: Self-pay | Admitting: Family Medicine

## 2018-05-18 NOTE — Telephone Encounter (Signed)
Information shared with the patient about his MRI results.    He confirmed that Dr. Caryn Section is still his PCP and would like the MRI results to be shared with him.  I forwarded a copy of the MRI to Dr. Caryn Section via Epic messaging.

## 2018-05-18 NOTE — Telephone Encounter (Signed)
-----   Message from Nori Riis, PA-C sent at 05/18/2018  8:59 AM EDT ----- Please let Mr. Kappes know that the MRI showed that the areas in his kidneys are cysts, which are benign.  Is his PCP still Dr. Caryn Section?  I will need to send this report to his PCP.

## 2018-05-18 NOTE — Telephone Encounter (Signed)
LMOM for patient to return call.

## 2018-05-23 ENCOUNTER — Ambulatory Visit: Payer: PPO | Admitting: Anesthesiology

## 2018-05-23 ENCOUNTER — Encounter: Payer: Self-pay | Admitting: Anesthesiology

## 2018-05-23 ENCOUNTER — Ambulatory Visit
Admission: RE | Admit: 2018-05-23 | Discharge: 2018-05-23 | Disposition: A | Discharge status: Home / Self Care | Payer: PPO | Source: Ambulatory Visit | Attending: Gastroenterology | Admitting: Gastroenterology

## 2018-05-23 ENCOUNTER — Encounter
Admission: RE | Disposition: A | Discharge status: Home / Self Care | Payer: Self-pay | Source: Ambulatory Visit | Attending: Gastroenterology

## 2018-05-23 DIAGNOSIS — I4891 Unspecified atrial fibrillation: Secondary | ICD-10-CM | POA: Insufficient documentation

## 2018-05-23 DIAGNOSIS — I10 Essential (primary) hypertension: Secondary | ICD-10-CM | POA: Insufficient documentation

## 2018-05-23 DIAGNOSIS — Z87891 Personal history of nicotine dependence: Secondary | ICD-10-CM | POA: Insufficient documentation

## 2018-05-23 DIAGNOSIS — E039 Hypothyroidism, unspecified: Secondary | ICD-10-CM | POA: Diagnosis not present

## 2018-05-23 DIAGNOSIS — I739 Peripheral vascular disease, unspecified: Secondary | ICD-10-CM | POA: Diagnosis not present

## 2018-05-23 DIAGNOSIS — Z7901 Long term (current) use of anticoagulants: Secondary | ICD-10-CM | POA: Diagnosis not present

## 2018-05-23 DIAGNOSIS — Z1211 Encounter for screening for malignant neoplasm of colon: Secondary | ICD-10-CM | POA: Diagnosis not present

## 2018-05-23 DIAGNOSIS — F329 Major depressive disorder, single episode, unspecified: Secondary | ICD-10-CM | POA: Diagnosis not present

## 2018-05-23 DIAGNOSIS — Z86010 Personal history of colonic polyps: Secondary | ICD-10-CM | POA: Insufficient documentation

## 2018-05-23 DIAGNOSIS — D123 Benign neoplasm of transverse colon: Secondary | ICD-10-CM | POA: Insufficient documentation

## 2018-05-23 DIAGNOSIS — D122 Benign neoplasm of ascending colon: Secondary | ICD-10-CM | POA: Diagnosis not present

## 2018-05-23 DIAGNOSIS — Z89029 Acquired absence of unspecified finger(s): Secondary | ICD-10-CM | POA: Insufficient documentation

## 2018-05-23 DIAGNOSIS — Z8673 Personal history of transient ischemic attack (TIA), and cerebral infarction without residual deficits: Secondary | ICD-10-CM | POA: Insufficient documentation

## 2018-05-23 DIAGNOSIS — I480 Paroxysmal atrial fibrillation: Secondary | ICD-10-CM | POA: Diagnosis not present

## 2018-05-23 DIAGNOSIS — K635 Polyp of colon: Secondary | ICD-10-CM | POA: Insufficient documentation

## 2018-05-23 DIAGNOSIS — D120 Benign neoplasm of cecum: Secondary | ICD-10-CM | POA: Diagnosis not present

## 2018-05-23 DIAGNOSIS — D126 Benign neoplasm of colon, unspecified: Secondary | ICD-10-CM | POA: Diagnosis not present

## 2018-05-23 DIAGNOSIS — K621 Rectal polyp: Secondary | ICD-10-CM | POA: Insufficient documentation

## 2018-05-23 DIAGNOSIS — K219 Gastro-esophageal reflux disease without esophagitis: Secondary | ICD-10-CM | POA: Diagnosis not present

## 2018-05-23 DIAGNOSIS — Z79899 Other long term (current) drug therapy: Secondary | ICD-10-CM | POA: Insufficient documentation

## 2018-05-23 HISTORY — PX: COLONOSCOPY WITH PROPOFOL: SHX5780

## 2018-05-23 SURGERY — COLONOSCOPY WITH PROPOFOL
Anesthesia: General

## 2018-05-23 MED ORDER — LIDOCAINE HCL (CARDIAC) PF 100 MG/5ML IV SOSY
PREFILLED_SYRINGE | INTRAVENOUS | Status: DC | PRN
  Administered 2018-05-23: 50 mg via INTRAVENOUS

## 2018-05-23 MED ORDER — LIDOCAINE HCL (PF) 1 % IJ SOLN
INTRAMUSCULAR | Status: AC
  Administered 2018-05-23: 0.3 mL via INTRADERMAL
  Filled 2018-05-23: qty 2

## 2018-05-23 MED ORDER — PROPOFOL 500 MG/50ML IV EMUL
INTRAVENOUS | Status: DC | PRN
  Administered 2018-05-23: 130 ug/kg/min via INTRAVENOUS

## 2018-05-23 MED ORDER — LIDOCAINE HCL (PF) 1 % IJ SOLN
2.0000 mL | Freq: Once | INTRAMUSCULAR | Status: AC
  Administered 2018-05-23: 0.3 mL via INTRADERMAL

## 2018-05-23 MED ORDER — SODIUM CHLORIDE 0.9 % IV SOLN
INTRAVENOUS | Status: DC
  Administered 2018-05-23: 1000 mL via INTRAVENOUS

## 2018-05-23 MED ORDER — PROPOFOL 10 MG/ML IV BOLUS
INTRAVENOUS | Status: DC | PRN
  Administered 2018-05-23: 60 mg via INTRAVENOUS

## 2018-05-23 NOTE — Op Note (Signed)
[facility] Gastroenterology Patient Name: Ian Sanders Procedure Date: 05/23/2018 10:13 AM MRN: [id number] Account #: [id number] Date of Birth: 20-Jan-1941 Admit Type: Outpatient Age: 77 Room: [facility] ENDO ROOM 4 Gender: Male Note Status: Finalized Procedure:            Colonoscopy Indications:          High risk colon cancer surveillance: Personal history                        of colonic polyps Providers:            [name] MD, MD Referring MD:         Kirstie Peri. Caryn Section, MD (Referring MD) Medicines:            Monitored Anesthesia Care Complications:        No immediate complications. Procedure:            Pre-Anesthesia Assessment:                       - Prior to the procedure, a History and Physical was                        performed, and patient medications, allergies and                        sensitivities were reviewed. The patient's tolerance of                        previous anesthesia was reviewed.                       - The risks and benefits of the procedure and the                        sedation options and risks were discussed with the                        patient. All questions were answered and informed                        consent was obtained.                       - ASA Grade Assessment: II - A patient with mild                        systemic disease.                       After obtaining informed consent, the colonoscope was                        passed under direct vision. Throughout the procedure,                        the patient's blood pressure, pulse, and oxygen                        saturations were monitored continuously. The  Colonoscope was introduced through the anus and                        advanced to the the cecum, identified by the                        appendiceal orifice, IC valve and transillumination.                        The colonoscopy was performed with ease. The patient                tolerated the procedure well. The quality of the bowel                        preparation was adequate. Findings:      The perianal and digital rectal examinations were normal.      Two sessile polyps were found in the rectum and transverse colon. The       polyps were 4 to 6 mm in size. These polyps were removed with a cold       snare. Resection and retrieval were complete.      A 7 mm polyp was found in the cecum. The polyp was sessile. The polyp       was removed with a cold snare. Resection and retrieval were complete. To       prevent bleeding after the polypectomy, two hemostatic clips were       successfully placed. There was no bleeding during, or at the end, of the       procedure.      A 3 mm polyp was found in the ascending colon. The polyp was sessile.       The polyp was removed with a cold biopsy forceps. Resection and       retrieval were complete.      The exam was otherwise without abnormality on direct and retroflexion       views. Impression:           - Two 4 to 6 mm polyps in the rectum and in the                        transverse colon, removed with a cold snare. Resected                        and retrieved.                       - One 7 mm polyp in the cecum, removed with a cold                        snare. Resected and retrieved. Clips were placed.                       - One 3 mm polyp in the ascending colon, removed with a                        cold biopsy forceps. Resected and retrieved.                       - The examination was otherwise normal on direct and  retroflexion views. Recommendation:       - Discharge patient to home (with escort).                       - Resume previous diet.                       - Continue present medications.                       - Resume Xarelto (rivaroxaban) at prior dose today.                       - Await pathology results.                       - Repeat colonoscopy for  surveillance based on                        pathology results. Procedure Code(s):    --- Professional ---                       [id number], Colonoscopy, flexible; with removal of tumor(s),                        polyp(s), or other lesion(s) by snare technique                       45380, 74, Colonoscopy, flexible; with biopsy, single                        or multiple Diagnosis Code(s):    --- Professional ---                       Z86.010, Personal history of colonic polyps                       K62.1, Rectal polyp                       D12.0, Benign neoplasm of cecum                       D12.3, Benign neoplasm of transverse colon (hepatic                        flexure or splenic flexure)                       D12.2, Benign neoplasm of ascending colon CPT copyright 2017 American Medical Association. All rights reserved. The codes documented in this report are preliminary and upon coder review may  be revised to meet current compliance requirements. [name], MD [name] MD, MD 05/23/2018 10:46:29 AM This report has been signed electronically. Number of Addenda: 0 Note Initiated On: 05/23/2018 10:13 AM Scope Withdrawal Time: 0 hours 22 minutes 50 seconds  Total Procedure Duration: 0 hours 25 minutes 57 seconds       [facility]

## 2018-05-23 NOTE — Transfer of Care (Signed)
Immediate Anesthesia Transfer of Care Note  Patient: Ian Sanders  Procedure(s) Performed: COLONOSCOPY WITH PROPOFOL (N/A )  Patient Location: PACU  Anesthesia Type:General  Level of Consciousness: sedated  Airway & Oxygen Therapy: Patient Spontanous Breathing and Patient connected to nasal cannula oxygen  Post-op Assessment: Report given to RN and Post -op Vital signs reviewed and stable  Post vital signs: Reviewed and stable  Last Vitals:  Vitals Value Taken Time  BP 95/59 05/23/2018 10:48 AM  Temp 36.4 C 05/23/2018 10:46 AM  Pulse 52 05/23/2018 10:48 AM  Resp 18 05/23/2018 10:48 AM  SpO2 98 % 05/23/2018 10:48 AM    Last Pain:  Vitals:   05/23/18 1046  TempSrc: Tympanic  PainSc: 0-No pain         Complications: No apparent anesthesia complications

## 2018-05-23 NOTE — H&P (Signed)
[name], MD [address], [place], [place], [place], [address] [address], [place], [place], [place], [address] Phone: [phone]  Fax: [phone]  Primary Care Physician:  Birdie Sons, MD   Pre-Procedure History & Physical: HPI:  Ian Sanders is a 77 y.o. male is here for an colonoscopy.   Past Medical History:  Diagnosis Date  . Atrial fibrillation ([place])   . Benign paroxysmal positional vertigo   . Cancer ([place])   . History of chicken pox   . History of measles   . History of mumps   . Hypertension   . Mononeuritis of unspecified site   . Other seborrheic keratosis   . Stroke ([place])   . 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 Unspecified hypothyroidism     Past Surgical History:  Procedure Laterality Date  . Carotid Doppler Ultrasound  04/02/2010   Small amount Calcified plaque bilaterally, no significant stenosis.  . COLONOSCOPY  05/11/2014   Tubular Adenoma. Dr. Allen Norris  . DOPPLER ECHOCARDIOGRAPHY  04/02/2010   Mild left atrial dilation. Normal right atrium. No valvular disease. No thrombus. Normal LV function. EF>55%  . FINGER AMPUTATION    . HERNIA REPAIR    . MRI Brain with and without contrast  03/20/2010   Suggestive of basal ganglia lacunar infarct  . PROSTATECTOMY  1995   Abdominal  . SKIN SURGERY     multiple  . Thumb surgery      Prior to Admission medications   Medication Sig Start Date End Date Taking? Authorizing Provider  atenolol (TENORMIN) 50 MG tablet Take 1 tablet (50 mg total) by mouth daily. 07/15/17   Minna Merritts, MD  BIOTIN PO Take by mouth daily.    [provider]  calcium-vitamin D (OSCAL) 250-125 MG-UNIT per tablet Take 1 tablet by mouth 2 (two) times daily.     [provider]  chlorthalidone (HYGROTON) 25 MG tablet Take 1 tablet (25 mg total) by mouth daily. Patient taking differently: Take 12.5 mg by mouth daily.  07/15/17   Minna Merritts, MD  Cyanocobalamin (VITAMIN B-12 PO) Take by mouth daily.    [provider]  cyclobenzaprine (FLEXERIL) 10 MG tablet Take 0.5 tablets (5 mg total) by mouth 2 (two) times daily as needed. 01/17/18   Birdie Sons, MD  Docusate Calcium (STOOL SOFTENER PO) Take by mouth as needed.    [provider]  EPINEPHrine (EPIPEN 2-PAK) 0.3 mg/0.3 mL IJ SOAJ injection Inject 0.3 mg into the muscle once.  02/12/15   [provider]  flecainide (TAMBOCOR) 50 MG tablet Take 1 tablet (50 mg total) by mouth 2 (two) times daily. 07/15/17   Minna Merritts, MD  Leuprolide Acetate, 6 Month, (LUPRON) 45 MG injection Inject 45 mg into the muscle every 6 (six) months.    [provider]  omeprazole (PRILOSEC) 40 MG capsule TAKE ONE CAPSULE DAILY. 02/18/18   Birdie Sons, MD  rivaroxaban (XARELTO) 20 MG TABS tablet Take 1 tablet (20 mg total) by mouth daily. 08/21/16   Minna Merritts, MD  zolpidem (AMBIEN) 10 MG tablet TAKE ONE-HALF TO ONE TABLET AT BEDTIME. 01/12/18   Birdie Sons, MD    Allergies as of 05/06/2018 - Review Complete 05/02/2018  Allergen Reaction Noted  . Lisinopril Swelling 02/12/2015  . Myrbetriq [mirabegron] Swelling 12/26/2015  . Atorvastatin  06/19/2015  . Dabigatran etexilate mesylate  12/14/2011    Family  History  Problem Relation Age of Onset  . Heart disease Mother 51       CABG  . Hypertension Mother   . Heart attack Mother   . Hypertension Brother   . Heart disease Sister        stents  . Prostate cancer Brother   . Kidney cancer Brother   . Skin cancer Brother   . Hypertension Brother   . Bladder Cancer Sister   . Leukemia Father   . Stroke Father        multiple  . Cerebral aneurysm Sister   . Kidney cancer Brother     Social History   Socioeconomic History  . Marital status: Married    Spouse name: Not on file  . Number of children: 2  . Years of education: Not on file  . Highest education level: Associate degree:  occupational, Hotel manager, or vocational program  Occupational History  . Occupation: Retired  Scientific laboratory technician  . Financial resource strain: Not hard at all  . Food insecurity:    Worry: Never true    Inability: Never true  . Transportation needs:    Medical: No    Non-medical: No  Tobacco Use  . Smoking status: Former Smoker    Packs/day: 1.00    Years: 12.00    Pack years: 12.00    Last attempt to quit: 10/28/1971    Years since quitting: 46.6  . Smokeless tobacco: Current User    Types: Chew  Substance and Sexual Activity  . Alcohol use: Yes    Alcohol/week: 6.0 oz    Types: 10 Cans of beer per week    Comment: moderate use  . Drug use: No  . Sexual activity: Not on file  Lifestyle  . Physical activity:    Days per week: Not on file    Minutes per session: Not on file  . Stress: Not at all  Relationships  . Social connections:    Talks on phone: Not on file    Gets together: Not on file    Attends religious service: Not on file    Active member of club or organization: Not on file    Attends meetings of clubs or organizations: Not on file    Relationship status: Not on file  . Intimate partner violence:    Fear of current or ex partner: Not on file    Emotionally abused: Not on file    Physically abused: Not on file    Forced sexual activity: Not on file  Other Topics Concern  . Not on file  Social History Narrative  . Not on file    Review of Systems: See HPI, otherwise negative ROS  Physical Exam: BP 134/64   Pulse (!) 53   Temp 97.8 F (36.6 C) (Tympanic)   Resp 17   Ht 5\' 10"  (1.778 m)   Wt 191 lb (86.6 kg)   SpO2 98%   BMI 27.41 kg/m  General:   Alert,  pleasant and cooperative in NAD Head:  Normocephalic and atraumatic. Neck:  Supple; no masses or thyromegaly. Lungs:  Clear throughout to auscultation, normal respiratory effort.    Heart:  +S1, +S2, Regular rate and rhythm, No edema. Abdomen:  Soft, nontender and nondistended. Normal bowel  sounds, without guarding, and without rebound.   Neurologic:  Alert and  oriented x4;  grossly normal neurologically.  Impression/Plan: Ian Sanders is here for an colonoscopy to be performed for surveillance due to  prior history of colon polyps   Risks, benefits, limitations, and alternatives regarding  colonoscopy have been reviewed with the patient.  Questions have been answered.  All parties agreeable.   [name], MD  05/23/2018, 10:02 AM

## 2018-05-23 NOTE — Anesthesia Postprocedure Evaluation (Signed)
Anesthesia Post Note  Patient: Ian Sanders  Procedure(s) Performed: COLONOSCOPY WITH PROPOFOL (N/A )  Patient location during evaluation: Endoscopy Anesthesia Type: General Level of consciousness: awake and alert Pain management: pain level controlled Vital Signs Assessment: post-procedure vital signs reviewed and stable Respiratory status: spontaneous breathing, nonlabored ventilation, respiratory function stable and patient connected to nasal cannula oxygen Cardiovascular status: blood pressure returned to baseline and stable Postop Assessment: no apparent nausea or vomiting Anesthetic complications: no     Last Vitals:  Vitals:   05/23/18 1046 05/23/18 1048  BP: (!) 95/59 (!) 95/59  Pulse: (!) 54 (!) 52  Resp: 16 18  Temp: 36.4 C   SpO2: 97% 98%    Last Pain:  Vitals:   05/23/18 1106  TempSrc:   PainSc: 0-No pain                 Martha Clan

## 2018-05-23 NOTE — Anesthesia Procedure Notes (Signed)
Performed by: Treacy Holcomb, CRNA Pre-anesthesia Checklist: Patient identified, Emergency Drugs available, Suction available, Patient being monitored and Timeout performed Patient Re-evaluated:Patient Re-evaluated prior to induction Oxygen Delivery Method: Nasal cannula Induction Type: IV induction       

## 2018-05-23 NOTE — Anesthesia Post-op Follow-up Note (Signed)
Anesthesia QCDR form completed.        

## 2018-05-23 NOTE — Anesthesia Preprocedure Evaluation (Signed)
Anesthesia Evaluation  Patient identified by MRN, date of birth, ID band Patient awake    Reviewed: Allergy & Precautions, H&P , NPO status , Patient's Chart, lab work & pertinent test results, reviewed documented beta blocker date and time   History of Anesthesia Complications Negative for: history of anesthetic complications  Airway Mallampati: III  TM Distance: >3 FB Neck ROM: full  Mouth opening: Limited Mouth Opening  Dental  (+) Caps, Dental Advidsory Given Permanent bridges:   Pulmonary neg pulmonary ROS, former smoker,           Cardiovascular Exercise Tolerance: Good hypertension, (-) angina+ Peripheral Vascular Disease  (-) CAD, (-) Past MI, (-) Cardiac Stents and (-) CABG + dysrhythmias Atrial Fibrillation (-) Valvular Problems/Murmurs     Neuro/Psych neg Seizures PSYCHIATRIC DISORDERS Depression CVA, No Residual Symptoms    GI/Hepatic negative GI ROS, Neg liver ROS, GERD  ,  Endo/Other  neg diabetesHypothyroidism   Renal/GU negative Renal ROS  negative genitourinary   Musculoskeletal   Abdominal   Peds  Hematology negative hematology ROS (+)   Anesthesia Other Findings Past Medical History: No date: Atrial fibrillation (HCC) No date: Benign paroxysmal positional vertigo No date: Cancer ([place]) No date: History of chicken pox No date: History of measles No date: History of mumps No date: Hypertension No date: Mononeuritis of unspecified site No date: Other seborrheic keratosis No date: Stroke [facility]) No date: Transient ischemic attack (TIA), 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) No date: Unspecified hypothyroidism   Reproductive/Obstetrics negative OB ROS                             Anesthesia Physical Anesthesia Plan  ASA: III  Anesthesia Plan: General   Post-op Pain Management:    Induction: Intravenous  PONV Risk Score and Plan: 2 and Propofol  infusion and TIVA  Airway Management Planned: Natural Airway and Nasal Cannula  Additional Equipment:   Intra-op Plan:   Post-operative Plan:   Informed Consent: I have reviewed the patients History and Physical, chart, labs and discussed the procedure including the risks, benefits and alternatives for the proposed anesthesia with the patient or authorized representative who has indicated his/her understanding and acceptance.   Dental Advisory Given  Plan Discussed with: Anesthesiologist, CRNA and Surgeon  Anesthesia Plan Comments:         Anesthesia Quick Evaluation

## 2018-05-24 ENCOUNTER — Encounter: Payer: Self-pay | Admitting: Gastroenterology

## 2018-05-24 LAB — SURGICAL PATHOLOGY

## 2018-06-06 ENCOUNTER — Other Ambulatory Visit: Payer: Self-pay | Admitting: Family Medicine

## 2018-06-06 ENCOUNTER — Ambulatory Visit: Payer: PPO | Admitting: Podiatry

## 2018-06-06 ENCOUNTER — Ambulatory Visit: Payer: Self-pay

## 2018-06-06 ENCOUNTER — Encounter: Payer: Self-pay | Admitting: Podiatry

## 2018-06-06 VITALS — BP 148/71 | HR 63

## 2018-06-06 DIAGNOSIS — E119 Type 2 diabetes mellitus without complications: Secondary | ICD-10-CM | POA: Diagnosis not present

## 2018-06-06 DIAGNOSIS — M205X2 Other deformities of toe(s) (acquired), left foot: Secondary | ICD-10-CM | POA: Diagnosis not present

## 2018-06-06 DIAGNOSIS — M205X1 Other deformities of toe(s) (acquired), right foot: Secondary | ICD-10-CM

## 2018-06-06 DIAGNOSIS — R52 Pain, unspecified: Secondary | ICD-10-CM

## 2018-06-06 NOTE — Progress Notes (Signed)
This patient presents to the office with chief complaint of burning all over the bottom of both feet.  He says that his right foot has more burning than his left foot.  He says this is been for 2 years an last few months.  He says he feels he can walk only a short distance before he has significant pain or discomfort in his feet.  This patient is diagnosed with diabetes with neuropathy.  Patient is taking xarelto.He presents the office today for an evaluation and treatment of his painful feet.  Patient states he has no history of trauma or injury to the foot.    General Appearance  Alert, conversant and in no acute stress.  Vascular  Dorsalis pedis and posterior tibial  pulses are palpable  bilaterally.  Capillary return is within normal limits  bilaterally. Temperature is within normal limits  bilaterally.  Neurologic  Senn-Weinstein monofilament wire test within normal limits  bilaterally. Muscle power within normal limits bilaterally.  Nails Normal nails noted with no evidence of bacterial or fungal infection.  Orthopedic  .  No crepitus or effusions noted.  No digital deformities noted. Patient has a functional hallux limitus first MPJ bilaterally.  Patient has bony exostosis noted at the head of the first metatarsal right foot.  Skin  normotropic skin with no porokeratosis noted bilaterally.  No signs of infections or ulcers noted.  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  IE.  X-rays taken do reveal a metatarsal primus elevatus  bilaterally.  Bony changes noted at the head of the first metatarsal right foot.  Calcification noted at the insertion of the plantar fascia and achilles tendon right foot. Discussed this condition with this patient.  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  He also does have a functional hallux limitus noted first MPJ bilaterally.  The fact that the big toe joint does not  transfer weight properly may also result in the burning pain. he experiences.  We discussed this condition and decided to treat him for the functional hallux limitus.  Patient was prescribed power step insoles.  If this is unsuccessful, he should make an appointment with his PCP to consider gabapentin surgery.   Gardiner Barefoot DPM

## 2018-06-29 DIAGNOSIS — L570 Actinic keratosis: Secondary | ICD-10-CM | POA: Diagnosis not present

## 2018-06-29 DIAGNOSIS — Z1283 Encounter for screening for malignant neoplasm of skin: Secondary | ICD-10-CM | POA: Diagnosis not present

## 2018-06-29 DIAGNOSIS — L578 Other skin changes due to chronic exposure to nonionizing radiation: Secondary | ICD-10-CM | POA: Diagnosis not present

## 2018-06-29 DIAGNOSIS — D7589 Other specified diseases of blood and blood-forming organs: Secondary | ICD-10-CM | POA: Diagnosis not present

## 2018-06-29 DIAGNOSIS — Z872 Personal history of diseases of the skin and subcutaneous tissue: Secondary | ICD-10-CM | POA: Diagnosis not present

## 2018-06-29 DIAGNOSIS — Z859 Personal history of malignant neoplasm, unspecified: Secondary | ICD-10-CM | POA: Diagnosis not present

## 2018-06-30 ENCOUNTER — Other Ambulatory Visit: Payer: Self-pay | Admitting: Family Medicine

## 2018-06-30 ENCOUNTER — Other Ambulatory Visit: Payer: PPO

## 2018-06-30 DIAGNOSIS — C61 Malignant neoplasm of prostate: Secondary | ICD-10-CM | POA: Diagnosis not present

## 2018-07-01 LAB — PSA: Prostate Specific Ag, Serum: <0.1 ng/mL (ref 0.0–4.0)

## 2018-07-06 NOTE — Progress Notes (Signed)
07/07/2018 3:28 PM   Ian Sanders [birth year] [id number]  Referring provider: Birdie Sons, [address] [place], [place] [address]  Chief Complaint  Patient presents with  . Prostate Cancer    2 month  . Hematuria    HPI: Ian Sanders is a 77 year old Caucasian with prostate cancer, incontinence and a history of hematuria who presents today for follow up.    History of hematuria CTU completed on 01/31/2018 noted a 1.2 cm partially exophytic lesion within the inferior pole of the right kidney with suggestion of mild contrast enhancement, raising the possibility of solid renal neoplasm.  There is a 9 mm lesion within the medial superior left kidney with suggestion of possible associated enhancement.  Recommend further evaluation of these lesions with MRI or short-term follow-up pre and post contrast-enhanced CT.  No nephroureterolithiasis. No hydronephrosis. No abnormal filling defects within the opacified renal collecting systems and ureters.  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  Prominent vascularity and stranding lateral to the ascending colon favored to be sequelae of portal venous hypertension.  Recommend clinical correlation for signs of colitis.  Cholelithiasis.  Bilateral femoral head AVN.  There is a 5 mm right lung nodule. No follow-up needed if patient is low-risk. Non-contrast chest CT can be considered in 12 months if patient is high-risk.  Cystoscopy on 02/09/2018 with Dr. Bernardo Heater was negative.  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 No suspicious renal masses.  He has not had gross hematuria.  His UA is negative for AMH.    Prostate cancer: Patient underwent RRP by Dr. Domingo Cocking at [facility] in [facility]. His PSA remained below 1 until 2014 when it rose to 9.8 ng/mL. He was started on ADT with [place] in 03/15. He had received Firmagon  for 4 months and then was switched to a longer acting agent, Lupron.  His last PSA was <0.1 ng/mL on 06/2018.    Incontinence: Experienced tongue swelling with Myrbetriq.  Experienced dry eyes with Toviaz.    PMH: Past Medical History:  Diagnosis Date  . Atrial fibrillation ([place])   . Benign paroxysmal positional vertigo   . Cancer ([place])   . History of chicken pox   . History of measles   . History of mumps   . Hypertension   . Mononeuritis of unspecified site   . Other seborrheic keratosis   . Stroke ([place])   . 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 Unspecified hypothyroidism     Surgical History: Past Surgical History:  Procedure Laterality Date  . Carotid Doppler Ultrasound  04/02/2010   Small amount Calcified plaque bilaterally, no significant stenosis.  . COLONOSCOPY  05/11/2014   Tubular Adenoma. Dr. Allen Norris  . COLONOSCOPY WITH PROPOFOL N/A 05/23/2018   Procedure: COLONOSCOPY WITH PROPOFOL;  Surgeon: [name], MD;  Location: [facility] ENDOSCOPY;  Service: Gastroenterology;  Laterality: N/A;  . DOPPLER ECHOCARDIOGRAPHY  04/02/2010   Mild left atrial dilation. Normal right atrium. No valvular disease. No thrombus. Normal LV function. EF>55%  . FINGER AMPUTATION    . HERNIA REPAIR    . MRI Brain with and without contrast  03/20/2010   Suggestive of basal ganglia lacunar infarct  . PROSTATECTOMY  1995   Abdominal  . SKIN SURGERY     multiple  . Thumb surgery  Home Medications:  Allergies as of 07/07/2018      Reactions   Lisinopril Swelling   Tongue swelling Tongue swelling  Tongue swelling Tongue swelling  Tongue swelling    Myrbetriq [mirabegron] Swelling   Of the tongue   Atorvastatin Other (See Comments)   Dizziness and Fatigue Dizziness and Fatigue   Dabigatran Etexilate Mesylate    Stomach ulcers (Pradaxa)   Dabigatran Etexilate Mesylate Other (See Comments)   Stomach ulcers (Pradaxa)        Medication List        Accurate as of 07/07/18  3:28 PM. Always use your most recent med list.          atenolol 50 MG tablet Commonly known as:  TENORMIN Take 1 tablet (50 mg total) by mouth daily.   BIOTIN PO Take by mouth daily.   calcium-vitamin D 250-125 MG-UNIT tablet Commonly known as:  OSCAL Take 1 tablet by mouth 2 (two) times daily.   chlorthalidone 25 MG tablet Commonly known as:  HYGROTON Take 1 tablet (25 mg total) by mouth daily.   cyclobenzaprine 10 MG tablet Commonly known as:  FLEXERIL Take 0.5 tablets (5 mg total) by mouth 2 (two) times daily as needed.   EPIPEN 2-PAK 0.3 mg/0.3 mL Soaj injection Generic drug:  EPINEPHrine Inject 0.3 mg into the muscle once.   fesoterodine 4 MG Tb24 tablet Commonly known as:  TOVIAZ Take 1 tablet (4 mg total) by mouth daily.   flecainide 50 MG tablet Commonly known as:  TAMBOCOR Take 1 tablet (50 mg total) by mouth 2 (two) times daily.   Leuprolide Acetate (6 Month) 45 MG injection Commonly known as:  LUPRON Inject 45 mg into the muscle every 6 (six) months.   omeprazole 40 MG capsule Commonly known as:  PRILOSEC TAKE ONE CAPSULE DAILY.   rivaroxaban 20 MG Tabs tablet Commonly known as:  XARELTO Take 1 tablet (20 mg total) by mouth daily.   STOOL SOFTENER PO Take by mouth as needed.   VITAMIN B-12 PO Take by mouth daily.   zolpidem 10 MG tablet Commonly known as:  AMBIEN TAKE ONE-HALF TO ONE TABLET AT BEDTIME.       Allergies:  Allergies  Allergen Reactions  . Lisinopril Swelling    Tongue swelling Tongue swelling  Tongue swelling Tongue swelling  Tongue swelling   . Myrbetriq [Mirabegron] Swelling    Of the tongue  . Atorvastatin Other (See Comments)    Dizziness and Fatigue  Dizziness and Fatigue  . Dabigatran Etexilate Mesylate     Stomach ulcers (Pradaxa)  . Dabigatran Etexilate Mesylate Other (See Comments)    Stomach ulcers (Pradaxa)    Family History: Family History   Problem Relation Age of Onset  . Heart disease Mother 59       CABG  . Hypertension Mother   . Heart attack Mother   . Hypertension Brother   . Heart disease Sister        stents  . Prostate cancer Brother   . Kidney cancer Brother   . Skin cancer Brother   . Hypertension Brother   . Bladder Cancer Sister   . Leukemia Father   . Stroke Father        multiple  . Cerebral aneurysm Sister   . Kidney cancer Brother     Social History:  reports that he quit smoking about 46 years ago. He has a 12.00 pack-year smoking history. His smokeless tobacco use includes chew. He  reports that he drinks about 10.0 standard drinks of alcohol per week. He reports that he does not use drugs.  ROS: UROLOGY Frequent Urination?: No Hard to postpone urination?: Yes Burning/pain with urination?: No Get up at night to urinate?: No Leakage of urine?: Yes Urine stream starts and stops?: No Trouble starting stream?: No Do you have to strain to urinate?: No Blood in urine?: No Urinary tract infection?: No Sexually transmitted disease?: No Injury to kidneys or bladder?: No Painful intercourse?: No Weak stream?: No Erection problems?: No Penile pain?: No  Gastrointestinal Nausea?: No Vomiting?: No Indigestion/heartburn?: No Diarrhea?: No Constipation?: No  Constitutional Fever: No Night sweats?: No Weight loss?: No Fatigue?: No  Skin Skin rash/lesions?: No Itching?: No  Eyes Blurred vision?: No Double vision?: No  Ears/Nose/Throat Sore throat?: No Sinus problems?: No  Hematologic/Lymphatic Swollen glands?: No Easy bruising?: No  Cardiovascular Leg swelling?: No Chest pain?: No  Respiratory Cough?: No Shortness of breath?: No  Endocrine Excessive thirst?: No  Musculoskeletal Back pain?: No Joint pain?: No  Neurological Headaches?: No Dizziness?: No  Psychologic Depression?: No Anxiety?: No  Physical Exam: BP (!) 147/71   Pulse 79   Ht 5\' 10"  (1.778 m)    Wt 193 lb (87.5 kg)   BMI 27.69 kg/m   Constitutional: Well nourished. Alert and oriented, No acute distress. HEENT: [place] AT, moist mucus membranes. Trachea midline, no masses. Cardiovascular: No clubbing, cyanosis, or edema. Respiratory: Normal respiratory effort, no increased work of breathing. Skin: No rashes, bruises or suspicious lesions. Lymph: No cervical or inguinal adenopathy. Neurologic: Grossly intact, no focal deficits, moving all 4 extremities. Psychiatric: Normal mood and affect.    Laboratory Data: Lab Results  Component Value Date   CREATININE 1.07 05/10/2018   Lab Results  Component Value Date   HGBA1C 6.2 (A) 05/02/2018      Component Value Date/Time   CHOL 115 04/20/2017 1119   HDL 31 (L) 04/20/2017 1119   CHOLHDL 3.7 04/20/2017 1119   [place] 40 04/20/2017 1119    Lab Results  Component Value Date   AST 35 04/22/2018   Lab Results  Component Value Date   ALT 32 04/22/2018   I have reviewed the labs.   Assessment & Plan:    1. History of hematuria Hematuria work-up in June 2019 was negative UA is negative  2. History of prostate cancer Lupron and PSA q 6 months  He will return in September for injection  3. Incontinence He is taking the Toviaz 4 mg prn    Return in about 6 months (around 01/07/2019) for PSA and UA and recheck .  These notes generated with voice recognition software. I apologize for typographical errors.  Zara Council, PA-C  [facility] [address] [place], [place] [address] [phone]

## 2018-07-07 ENCOUNTER — Ambulatory Visit: Payer: PPO | Admitting: Urology

## 2018-07-07 ENCOUNTER — Encounter: Payer: Self-pay | Admitting: Urology

## 2018-07-07 VITALS — BP 147/71 | HR 79 | Ht 70.0 in | Wt 193.0 lb

## 2018-07-07 DIAGNOSIS — Z8546 Personal history of malignant neoplasm of prostate: Secondary | ICD-10-CM | POA: Diagnosis not present

## 2018-07-07 DIAGNOSIS — N3946 Mixed incontinence: Secondary | ICD-10-CM | POA: Diagnosis not present

## 2018-07-07 DIAGNOSIS — Z87448 Personal history of other diseases of urinary system: Secondary | ICD-10-CM | POA: Diagnosis not present

## 2018-07-07 LAB — URINALYSIS, COMPLETE
Bilirubin, UA: NEGATIVE
GLUCOSE, UA: NEGATIVE
KETONES UA: NEGATIVE
LEUKOCYTES UA: NEGATIVE
Nitrite, UA: NEGATIVE
SPEC GRAV UA: 1.02 (ref 1.005–1.030)
Urobilinogen, Ur: 0.2 mg/dL (ref 0.2–1.0)
pH, UA: 7 (ref 5.0–7.5)

## 2018-07-07 LAB — MICROSCOPIC EXAMINATION: WBC, UA: NONE SEEN /hpf (ref 0–5)

## 2018-07-07 MED ORDER — FESOTERODINE FUMARATE ER 4 MG PO TB24: 4.0000 mg | ORAL_TABLET | Freq: Every day | ORAL | 3 refills | Status: DC

## 2018-07-25 ENCOUNTER — Ambulatory Visit (INDEPENDENT_AMBULATORY_CARE_PROVIDER_SITE_OTHER): Payer: PPO

## 2018-07-25 DIAGNOSIS — C61 Malignant neoplasm of prostate: Secondary | ICD-10-CM | POA: Diagnosis not present

## 2018-07-25 MED ORDER — LEUPROLIDE ACETATE (6 MONTH) 45 MG IM KIT
45.0000 mg | PACK | Freq: Once | INTRAMUSCULAR | Status: AC
  Administered 2018-07-25: 45 mg via INTRAMUSCULAR

## 2018-07-25 NOTE — Progress Notes (Signed)
Lupron IM Injection   Due to Prostate Cancer patient is present today for a Lupron Injection.  Medication: Lupron 6 month Dose: 45 mg  Location: left upper outer buttocks Lot: [id number] Exp: 10/09/2020  Patient tolerated well, no complications were noted  Performed by: Cristie Hem, CMA  Follow up: As scheduled

## 2018-08-03 ENCOUNTER — Other Ambulatory Visit: Payer: Self-pay | Admitting: Cardiovascular Disease

## 2018-08-04 ENCOUNTER — Telehealth: Payer: Self-pay | Admitting: Podiatry

## 2018-08-04 NOTE — Telephone Encounter (Signed)
I would like a copy of my office visit notes from 22 July mailed to me at my house. If you would call me at [phone] and confirm that you are going to do this. Thank you very much.

## 2018-08-08 ENCOUNTER — Telehealth: Payer: Self-pay | Admitting: Podiatry

## 2018-08-08 NOTE — Telephone Encounter (Signed)
I called the pt back and apologized for just now returning his call from the message he left this past Thursday. I told him he would need to fill out and sign a medical records release form in order for [place] to give him his office visit notes from date of service 22 July. Pt stated he would come by the office to fill out and sign the release form. I told the pt when he came by to just ask one of the ladies up front to print those notes out for him so he wouldn't have to wait on them to be mailed or go back to the office to pick them up.

## 2018-08-26 ENCOUNTER — Encounter: Payer: Self-pay | Admitting: Family Medicine

## 2018-08-26 ENCOUNTER — Ambulatory Visit (INDEPENDENT_AMBULATORY_CARE_PROVIDER_SITE_OTHER): Payer: PPO | Admitting: Family Medicine

## 2018-08-26 VITALS — BP 122/64 | HR 64 | Temp 98.2°F | Resp 16 | Wt 199.0 lb

## 2018-08-26 DIAGNOSIS — Z23 Encounter for immunization: Secondary | ICD-10-CM | POA: Diagnosis not present

## 2018-08-26 DIAGNOSIS — G629 Polyneuropathy, unspecified: Secondary | ICD-10-CM | POA: Diagnosis not present

## 2018-08-26 MED ORDER — GABAPENTIN 100 MG PO CAPS: ORAL_CAPSULE | ORAL | 1 refills | Status: DC

## 2018-08-26 NOTE — Progress Notes (Signed)
Patient: Ian Sanders Male    DOB: 27-Jul-1941   77 y.o.   MRN: [id number] Visit Date: 08/26/2018  Today's Provider: Lelon Huh, MD   Chief Complaint  Patient presents with  . Foot Pain    Bilateral; started two years ago but worsening in the last two months.   Subjective:    HPI   Pt comes in today complaining of bilateral foot pain.  Right worse than left.  He states it started about two years ago but has been worsening in the last two months.  Pt does have a history of diabetes.  He was evaluated at [facility] in July and felt to have neuropathy  He has been using ice, tylenol and episome salt as treatments.  Pt says the Tylenol helps some.  Walking makes the symptoms worse especially "hot, hard surfaces."      Allergies  Allergen Reactions  . Lisinopril Swelling    Tongue swelling Tongue swelling  Tongue swelling Tongue swelling  Tongue swelling   . Myrbetriq [Mirabegron] Swelling    Of the tongue  . Atorvastatin Other (See Comments)    Dizziness and Fatigue  Dizziness and Fatigue  . Dabigatran Etexilate Mesylate     Stomach ulcers (Pradaxa)  . Dabigatran Etexilate Mesylate Other (See Comments)    Stomach ulcers (Pradaxa)     Current Outpatient Medications:  .  atenolol (TENORMIN) 50 MG tablet, Take 1 tablet (50 mg total) by mouth daily., Disp: 30 tablet, Rfl: 0 .  BIOTIN PO, Take by mouth daily., Disp: , Rfl:  .  calcium-vitamin D (OSCAL) 250-125 MG-UNIT per tablet, Take 1 tablet by mouth 2 (two) times daily. , Disp: , Rfl:  .  chlorthalidone (HYGROTON) 25 MG tablet, Take 1 tablet (25 mg total) by mouth daily. (Patient taking differently: Take 12.5 mg by mouth daily. ), Disp: 90 tablet, Rfl: 3 .  Cyanocobalamin (VITAMIN B-12 PO), Take by mouth daily., Disp: , Rfl:  .  cyclobenzaprine (FLEXERIL) 10 MG tablet, Take 0.5 tablets (5 mg total) by mouth 2 (two) times daily as needed., Disp: 30 tablet, Rfl: 3 .  Docusate Calcium (STOOL SOFTENER PO),  Take by mouth as needed., Disp: , Rfl:  .  EPINEPHrine (EPIPEN 2-PAK) 0.3 mg/0.3 mL IJ SOAJ injection, Inject 0.3 mg into the muscle once. , Disp: , Rfl:  .  fesoterodine (TOVIAZ) 4 MG TB24 tablet, Take 1 tablet (4 mg total) by mouth daily., Disp: 30 tablet, Rfl: 3 .  flecainide (TAMBOCOR) 50 MG tablet, Take 1 tablet (50 mg total) by mouth 2 (two) times daily., Disp: 180 tablet, Rfl: 3 .  Leuprolide Acetate, 6 Month, (LUPRON) 45 MG injection, Inject 45 mg into the muscle every 6 (six) months., Disp: , Rfl:  .  omeprazole (PRILOSEC) 40 MG capsule, TAKE ONE CAPSULE DAILY., Disp: 90 capsule, Rfl: 4 .  rivaroxaban (XARELTO) 20 MG TABS tablet, Take 1 tablet (20 mg total) by mouth daily., Disp: 30 tablet, Rfl: 11 .  zolpidem (AMBIEN) 10 MG tablet, TAKE ONE-HALF TO ONE TABLET AT BEDTIME., Disp: 90 tablet, Rfl: 3    Review of Systems  Constitutional: Negative.   Musculoskeletal: Positive for myalgias. Negative for arthralgias, back pain, gait problem, joint swelling, neck pain and neck stiffness.    Social History   Tobacco Use  . Smoking status: Former Smoker    Packs/day: 1.00    Years: 12.00    Pack years: 12.00    Last  attempt to quit: 10/28/1971    Years since quitting: 46.8  . Smokeless tobacco: Current User    Types: Chew  Substance Use Topics  . Alcohol use: Yes    Alcohol/week: 10.0 standard drinks    Types: 10 Cans of beer per week    Comment: moderate use   Objective:   BP 122/64 (BP Location: Right Arm, Patient Position: Sitting, Cuff Size: Normal)   Pulse 64   Temp 98.2 F (36.8 C) (Oral)   Resp 16   Wt 199 lb (90.3 kg)   BMI 28.55 kg/m  Vitals:   08/26/18 1411  BP: 122/64  Pulse: 64  Resp: 16  Temp: 98.2 F (36.8 C)  TempSrc: Oral  Weight: 199 lb (90.3 kg)     Physical Exam  General appearance: alert, well developed, well nourished, cooperative and in no distress Head: Normocephalic, without obvious abnormality, atraumatic Respiratory: Respirations even  and unlabored, normal respiratory rate Extremities: +2 pedal pulses. Diminished s/s both feet.      Assessment & Plan:     1. 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 start - gabapentin (NEURONTIN) 100 MG capsule; Start one at bedtime for 4 days, then two at bedtime for 4 days, then 3 at bedtime  Dispense: 60 capsule; Refill: 1  Follow up neuropathy and diabetes in a month.       Lelon Huh, MD  [facility]

## 2018-09-12 DIAGNOSIS — I10 Essential (primary) hypertension: Secondary | ICD-10-CM | POA: Diagnosis not present

## 2018-09-12 DIAGNOSIS — I480 Paroxysmal atrial fibrillation: Secondary | ICD-10-CM | POA: Diagnosis not present

## 2018-09-12 DIAGNOSIS — I700 Atherosclerosis of aorta: Secondary | ICD-10-CM | POA: Diagnosis not present

## 2018-09-12 DIAGNOSIS — Z720 Tobacco use: Secondary | ICD-10-CM | POA: Diagnosis not present

## 2018-09-12 DIAGNOSIS — Z8673 Personal history of transient ischemic attack (TIA), and cerebral infarction without residual deficits: Secondary | ICD-10-CM | POA: Diagnosis not present

## 2018-09-23 ENCOUNTER — Other Ambulatory Visit: Payer: Self-pay | Admitting: Family Medicine

## 2018-10-04 ENCOUNTER — Encounter: Payer: Self-pay | Admitting: Family Medicine

## 2018-10-04 ENCOUNTER — Ambulatory Visit (INDEPENDENT_AMBULATORY_CARE_PROVIDER_SITE_OTHER): Payer: PPO | Admitting: Family Medicine

## 2018-10-04 VITALS — BP 126/62 | HR 56 | Temp 98.3°F | Resp 16 | Wt 195.0 lb

## 2018-10-04 DIAGNOSIS — E119 Type 2 diabetes mellitus without complications: Secondary | ICD-10-CM

## 2018-10-04 DIAGNOSIS — E781 Pure hyperglyceridemia: Secondary | ICD-10-CM

## 2018-10-04 DIAGNOSIS — G629 Polyneuropathy, unspecified: Secondary | ICD-10-CM | POA: Diagnosis not present

## 2018-10-04 DIAGNOSIS — I10 Essential (primary) hypertension: Secondary | ICD-10-CM | POA: Diagnosis not present

## 2018-10-04 LAB — POCT GLYCOSYLATED HEMOGLOBIN (HGB A1C): HEMOGLOBIN A1C: 6.3 % — AB (ref 4.0–5.6)

## 2018-10-04 MED ORDER — GABAPENTIN 100 MG PO CAPS: 100.0000 mg | ORAL_CAPSULE | Freq: Three times a day (TID) | ORAL | 3 refills | Status: DC

## 2018-10-04 NOTE — Progress Notes (Signed)
Patient: Ian Sanders Male    DOB: 15-Apr-1941   77 y.o.   MRN: [id number] Visit Date: 10/04/2018  Today's Provider: Lelon Huh, MD   Chief Complaint  Patient presents with  . Hypertension  . Diabetes  . Hyperlipidemia  . Peripheral Neuropathy   Subjective:    HPI   Diabetes Mellitus Type II, Follow-up:    Lab Results  Component Value Date   HGBA1C 6.2 (A) 05/02/2018   HGBA1C 5.4 04/20/2017   HGBA1C 6.3 (H) 05/21/2016   Last seen for diabetes 4 months ago.  Management since then includes None. He reports excellent compliance with treatment. He is not having side effects.  Current symptoms include paresthesia of the feet and have been improving. Home blood sugar records: Pt does not check blood sugar at home.  Episodes of hypoglycemia? no  Most Recent Eye Exam: Pt states he is due for an eye exam. Weight trend: stable  Current diet: in general, a "healthy" diet  , diabetic Current exercise: none  He was seen last month for neuropathy which was keeping him up at night and was prescribed gabapentin. He states the gabapentin kept him awake when he was taking it at bedtime, so he has started taking it throughout the day and reports the discomfort in feet has resolved. He is not having any other adverse effects.  ------------------------------------------------------------------------   Hypertension, follow-up:  BP Readings from Last 3 Encounters:  10/04/18 126/62  08/26/18 122/64  07/07/18 (!) 147/71    He was last seen for hypertension 4 months ago.  BP at that visit was normal. . Management since that visit includes No changes He reports excellent compliance with treatment. He  having side effects.  He is not exercising. He is not adherent to low salt diet.   Outside blood pressures are . He is experiencing none.  Patient denies chest pain, fatigue, lower extremity edema and orthopnea.   Cardiovascular risk factors include none.  Use of  agents associated with hypertension: none.   ------------------------------------------------------------------------    Wt Readings from Last 3 Encounters:  10/04/18 195 lb (88.5 kg)  08/26/18 199 lb (90.3 kg)  07/07/18 193 lb (87.5 kg)    ------------------------------------------------------------------------       Allergies  Allergen Reactions  . Lisinopril Swelling    Tongue swelling Tongue swelling  Tongue swelling Tongue swelling  Tongue swelling   . Myrbetriq [Mirabegron] Swelling    Of the tongue  . Atorvastatin Other (See Comments)    Dizziness and Fatigue  Dizziness and Fatigue  . Dabigatran Etexilate Mesylate     Stomach ulcers (Pradaxa)  . Dabigatran Etexilate Mesylate Other (See Comments)    Stomach ulcers (Pradaxa)     Current Outpatient Medications:  .  atenolol (TENORMIN) 50 MG tablet, Take 1 tablet (50 mg total) by mouth daily., Disp: 30 tablet, Rfl: 0 .  BIOTIN PO, Take by mouth daily., Disp: , Rfl:  .  calcium-vitamin D (OSCAL) 250-125 MG-UNIT per tablet, Take 1 tablet by mouth 2 (two) times daily. , Disp: , Rfl:  .  chlorthalidone (HYGROTON) 25 MG tablet, Take 1 tablet (25 mg total) by mouth daily. (Patient taking differently: Take 12.5 mg by mouth daily. ), Disp: 90 tablet, Rfl: 3 .  Cyanocobalamin (VITAMIN B-12 PO), Take by mouth daily., Disp: , Rfl:  .  cyclobenzaprine (FLEXERIL) 10 MG tablet, Take 0.5 tablets (5 mg total) by mouth 2 (two) times daily as needed., Disp: 30 tablet,  Rfl: 3 .  Docusate Calcium (STOOL SOFTENER PO), Take by mouth as needed., Disp: , Rfl:  .  EPINEPHrine (EPIPEN 2-PAK) 0.3 mg/0.3 mL IJ SOAJ injection, Inject 0.3 mg into the muscle once. , Disp: , Rfl:  .  fesoterodine (TOVIAZ) 4 MG TB24 tablet, Take 1 tablet (4 mg total) by mouth daily., Disp: 30 tablet, Rfl: 3 .  flecainide (TAMBOCOR) 50 MG tablet, Take 1 tablet (50 mg total) by mouth 2 (two) times daily., Disp: 180 tablet, Rfl: 3 .  gabapentin (NEURONTIN) 100 MG  capsule, Start one at bedtime for 4 days, then two at bedtime for 4 days, then 3 at bedtime, Disp: 60 capsule, Rfl: 1 .  Leuprolide Acetate, 6 Month, (LUPRON) 45 MG injection, Inject 45 mg into the muscle every 6 (six) months., Disp: , Rfl:  .  omeprazole (PRILOSEC) 40 MG capsule, TAKE ONE CAPSULE DAILY., Disp: 90 capsule, Rfl: 4 .  rivaroxaban (XARELTO) 20 MG TABS tablet, Take 1 tablet (20 mg total) by mouth daily., Disp: 30 tablet, Rfl: 11 .  zolpidem (AMBIEN) 10 MG tablet, TAKE ONE-HALF TO ONE TABLET AT BEDTIME., Disp: 90 tablet, Rfl: 1  Current Facility-Administered Medications:  .  lidocaine (XYLOCAINE) 2 % jelly 1 application, 1 application, Urethral, Once, Stoioff, Scott C, MD  Review of Systems  Constitutional: Negative.   Respiratory: Negative.   Cardiovascular: Negative.   Gastrointestinal: Negative.   Endocrine: Negative.   Neurological: Positive for numbness. Negative for dizziness, light-headedness and headaches.    Social History   Tobacco Use  . Smoking status: Former Smoker    Packs/day: 1.00    Years: 12.00    Pack years: 12.00    Last attempt to quit: 10/28/1971    Years since quitting: 46.9  . Smokeless tobacco: Current User    Types: Chew  Substance Use Topics  . Alcohol use: Yes    Alcohol/week: 10.0 standard drinks    Types: 10 Cans of beer per week    Comment: moderate use   Objective:   BP 126/62 (BP Location: Right Arm, Patient Position: Sitting, Cuff Size: Large)   Pulse (!) 56   Temp 98.3 F (36.8 C) (Oral)   Resp 16   Wt 195 lb (88.5 kg)   BMI 27.98 kg/m  Vitals:   10/04/18 1503  BP: 126/62  Pulse: (!) 56  Resp: 16  Temp: 98.3 F (36.8 C)  TempSrc: Oral  Weight: 195 lb (88.5 kg)     Physical Exam   General appearance: alert, well developed, well nourished, cooperative and in no distress Head: Normocephalic, without obvious abnormality, atraumatic Respiratory: Respirations even and unlabored, normal respiratory rate Extremities:  No gross deformities Skin: Skin color, texture, turgor normal. No rashes seen  Psych: Appropriate mood and affect. Neurologic: Mental status: Alert, oriented to person, place, and time, thought content appropriate.  Results for orders placed or performed in visit on 10/04/18  POCT glycosylated hemoglobin (Hb A1C)  Result Value Ref Range   Hemoglobin A1C 6.3 (A) 4.0 - 5.6 %       Assessment & Plan:     1. Neuropathy Much better since starting gabapentin. Continue current medications.   - gabapentin (NEURONTIN) 100 MG capsule; Take 1 capsule (100 mg total) by mouth 3 (three) times daily.  Dispense: 270 capsule; Refill: 3  2. Type 2 diabetes mellitus without complication, without long-term current use of insulin (HCC) Doing great with diet. Marland Kitchenwcm  - POCT glycosylated hemoglobin (Hb A1C)  3. Essential  hypertension Well controlled.  Continue current medications.    Follow up and AWV in June.       Lelon Huh, MD  [facility]

## 2018-10-04 NOTE — Patient Instructions (Signed)
   Please contact your eyecare professional to schedule a routine eye exam  

## 2018-11-01 ENCOUNTER — Ambulatory Visit: Payer: Self-pay | Admitting: Family Medicine

## 2018-12-14 ENCOUNTER — Encounter: Payer: Self-pay | Admitting: Family Medicine

## 2018-12-14 ENCOUNTER — Ambulatory Visit (INDEPENDENT_AMBULATORY_CARE_PROVIDER_SITE_OTHER): Payer: PPO | Admitting: Family Medicine

## 2018-12-14 VITALS — BP 149/84 | HR 58 | Wt 197.0 lb

## 2018-12-14 DIAGNOSIS — M7742 Metatarsalgia, left foot: Secondary | ICD-10-CM | POA: Diagnosis not present

## 2018-12-14 DIAGNOSIS — M7741 Metatarsalgia, right foot: Secondary | ICD-10-CM | POA: Diagnosis not present

## 2018-12-14 MED ORDER — TRAMADOL HCL 50 MG PO TABS: 50.0000 mg | ORAL_TABLET | Freq: Four times a day (QID) | ORAL | 1 refills | Status: DC | PRN

## 2018-12-14 NOTE — Patient Instructions (Signed)
.   Please review the attached list of medications and notify my office if there are any errors.   . Please bring all of your medications to every appointment so we can make sure that our medication list is the same as yours.   

## 2018-12-14 NOTE — Progress Notes (Signed)
Patient: Ian Sanders Male    DOB: 1941/08/23   78 y.o.   MRN: [id number] Visit Date: 12/14/2018  Today's Provider: Lelon Huh, MD   Chief Complaint  Patient presents with  . Foot Pain   Subjective:    Foot Pain  The current episode started more than 1 month ago. The problem occurs constantly. The problem has been unchanged. Pertinent negatives include no joint swelling, numbness or weakness. The symptoms are aggravated by walking, standing and twisting. He has tried ice, heat and relaxation for the symptoms. The treatment provided mild relief.   He has long history of neuropathy but states this pain is different. Is painful in bottom of forefoot when he first gets up in the morning. Pain relieved by taking weight off feet, but worsens the more he walks and is ambulation is limited by pain. Has taken old tramadol prescription which has was very effective.   Allergies  Allergen Reactions  . Lisinopril Swelling    Tongue swelling Tongue swelling  Tongue swelling Tongue swelling  Tongue swelling   . Myrbetriq [Mirabegron] Swelling    Of the tongue  . Atorvastatin Other (See Comments)    Dizziness and Fatigue  Dizziness and Fatigue  . Dabigatran Etexilate Mesylate     Stomach ulcers (Pradaxa)  . Dabigatran Etexilate Mesylate Other (See Comments)    Stomach ulcers (Pradaxa)     Current Outpatient Medications:  .  atenolol (TENORMIN) 50 MG tablet, Take 1 tablet (50 mg total) by mouth daily., Disp: 30 tablet, Rfl: 0 .  BIOTIN PO, Take by mouth daily., Disp: , Rfl:  .  calcium-vitamin D (OSCAL) 250-125 MG-UNIT per tablet, Take 1 tablet by mouth 2 (two) times daily. , Disp: , Rfl:  .  chlorthalidone (HYGROTON) 25 MG tablet, Take 1 tablet (25 mg total) by mouth daily. (Patient taking differently: Take 12.5 mg by mouth daily. ), Disp: 90 tablet, Rfl: 3 .  Cyanocobalamin (VITAMIN B-12 PO), Take by mouth daily., Disp: , Rfl:  .  cyclobenzaprine (FLEXERIL) 10 MG tablet,  Take 0.5 tablets (5 mg total) by mouth 2 (two) times daily as needed., Disp: 30 tablet, Rfl: 3 .  Docusate Calcium (STOOL SOFTENER PO), Take by mouth as needed., Disp: , Rfl:  .  EPINEPHrine (EPIPEN 2-PAK) 0.3 mg/0.3 mL IJ SOAJ injection, Inject 0.3 mg into the muscle once. , Disp: , Rfl:  .  fesoterodine (TOVIAZ) 4 MG TB24 tablet, Take 1 tablet (4 mg total) by mouth daily., Disp: 30 tablet, Rfl: 3 .  flecainide (TAMBOCOR) 50 MG tablet, Take 1 tablet (50 mg total) by mouth 2 (two) times daily., Disp: 180 tablet, Rfl: 3 .  gabapentin (NEURONTIN) 100 MG capsule, Take 1 capsule (100 mg total) by mouth 3 (three) times daily., Disp: 270 capsule, Rfl: 3 .  Leuprolide Acetate, 6 Month, (LUPRON) 45 MG injection, Inject 45 mg into the muscle every 6 (six) months., Disp: , Rfl:  .  omeprazole (PRILOSEC) 40 MG capsule, TAKE ONE CAPSULE DAILY., Disp: 90 capsule, Rfl: 4 .  rivaroxaban (XARELTO) 20 MG TABS tablet, Take 1 tablet (20 mg total) by mouth daily., Disp: 30 tablet, Rfl: 11 .  zolpidem (AMBIEN) 10 MG tablet, TAKE ONE-HALF TO ONE TABLET AT BEDTIME., Disp: 90 tablet, Rfl: 1  Current Facility-Administered Medications:  .  lidocaine (XYLOCAINE) 2 % jelly 1 application, 1 application, Urethral, Once, Stoioff, Scott C, MD  Review of Systems  Constitutional: Negative.   HENT: Negative.  Respiratory: Negative.   Cardiovascular: Negative.   Genitourinary: Negative.   Musculoskeletal: Negative for joint swelling.  Neurological: Negative.  Negative for weakness and numbness.    Social History   Tobacco Use  . Smoking status: Former Smoker    Packs/day: 1.00    Years: 12.00    Pack years: 12.00    Last attempt to quit: 10/28/1971    Years since quitting: 47.1  . Smokeless tobacco: Current User    Types: Chew  Substance Use Topics  . Alcohol use: Yes    Alcohol/week: 10.0 standard drinks    Types: 10 Cans of beer per week    Comment: moderate use      Objective:   BP (!) 149/84 (BP  Location: Left Arm, Patient Position: Sitting, Cuff Size: Normal)   Pulse (!) 58   Wt 197 lb (89.4 kg)   SpO2 98%   BMI 28.27 kg/m     Physical Exam  General appearance: alert, well developed, well nourished, cooperative and in no distress Head: Normocephalic, without obvious abnormality, atraumatic Respiratory: Respirations even and unlabored, normal respiratory rate Extremities: No tenderness, swelling, redness or gross deformities of feet, but he indicates that area under metatarsals is where pain is focused when he walks. .     Assessment & Plan    1. Metatarsalgia of both feet - traMADol (ULTRAM) 50 MG tablet; Take 1 tablet (50 mg total) by mouth every 6 (six) hours as needed.  Dispense: 30 tablet; Refill: 1  Recommend wear foam cushion shoe inserts and apply ice after long walks. He can call for podiatry referral if worsens or if he wants any more aggressive treatment.     Lelon Huh, MD  [facility]

## 2019-01-19 ENCOUNTER — Other Ambulatory Visit: Payer: Self-pay | Admitting: Family Medicine

## 2019-01-19 DIAGNOSIS — C61 Malignant neoplasm of prostate: Secondary | ICD-10-CM

## 2019-01-20 ENCOUNTER — Other Ambulatory Visit: Payer: PPO

## 2019-01-20 DIAGNOSIS — C61 Malignant neoplasm of prostate: Secondary | ICD-10-CM | POA: Diagnosis not present

## 2019-01-21 LAB — PSA

## 2019-01-23 ENCOUNTER — Ambulatory Visit: Payer: PPO | Admitting: Urology

## 2019-01-23 NOTE — Progress Notes (Signed)
01/24/2019 2:18 PM   Ian Sanders [birth year] [id number]  Referring provider: Birdie Sons, [place],  [id number]  Chief Complaint  Patient presents with  . Prostate Cancer    HPI: Ian Sanders is a 78 y.o. male Caucasian with prostate cancer, incontinence and a history of hematuria who presents today for follow up.    History of hematuria CTU completed on 01/31/2018 noted a 1.2 cm partially exophytic lesion within the inferior pole of the right kidney with suggestion of mild contrast enhancement, raising the possibility of solid renal neoplasm.  There is a 9 mm lesion within the medial superior left kidney with suggestion of possible associated enhancement.  Recommend further evaluation of these lesions with MRI or short-term follow-up pre and post contrast-enhanced CT.  No nephroureterolithiasis. No hydronephrosis. No abnormal filling defects within the opacified renal collecting systems and ureters.  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  Prominent vascularity and stranding lateral to the ascending colon favored to be sequelae of portal venous hypertension.  Recommend clinical correlation for signs of colitis.  Cholelithiasis.  Bilateral femoral head AVN.  There is a 5 mm right lung nodule. No follow-up needed if patient is low-risk. Non-contrast chest CT can be considered in 12 months if patient is high-risk.  Cystoscopy on 02/09/2018 with Dr. Bernardo Heater was negative.  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 No suspicious renal masses.  His UA was negative for AMH on 07/07/2018.  Today (01/24/2019), his UA was negative.  He reports that he feels okay, and denies any gross hematuria.  Prostate cancer: Patient underwent RRP by Dr. Domingo Cocking at [facility] in 1995. His PSA remained below 1 until 2014 when it rose to 9.8 ng/mL. He  was started on ADT with [place] in 03/15. He had received Firmagon for 4 months and then was switched to a longer acting agent, Lupron.  His last PSA was <0.1 ng/mL on 01/2019.  Patient reports on 01/24/2019 that he had a couple of days of pain at the injection site subsequent to injection with Lupron in 07/2018.  Incontinence: Experienced tongue swelling with Myrbetriq.  Experienced dry eyes with Toviaz.  He reports on 01/24/2019 that it is infrequent enough that he does not need a pad, and happens when he sneezes or the like, and is open to trying a drug vacation from the [place] to see if his symptoms are worse without medication.  His IPSS is 10/2.  Patient denies dysuria.  His PVR is 68 mL.    IPSS    Row Name 01/24/19 1300         International Prostate Symptom Score   How often have you had the sensation of not emptying your bladder?  Less than 1 in 5     How often have you had to urinate less than every two hours?  Less than 1 in 5 times     How often have you found you stopped and started again several times when you urinated?  About half the time     How often have you found it difficult to postpone urination?  Less than 1 in 5 times     How often have you had a weak urinary stream?  More than half the time     How often have you had to strain to start urination?  Not at  All     How many times did you typically get up at night to urinate?  None     Total IPSS Score  10       Quality of Life due to urinary symptoms   If you were to spend the rest of your life with your urinary condition just the way it is now how would you feel about that?  Mostly Satisfied        PMH: Past Medical History:  Diagnosis Date  . Atrial fibrillation ([place])   . Benign paroxysmal positional vertigo   . Cancer ([place])   . History of chicken pox   . History of measles   . History of mumps   . Hypertension   . Mononeuritis of unspecified site   . Other seborrheic keratosis   . Stroke ([place])   .  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 Unspecified hypothyroidism     Surgical History: Past Surgical History:  Procedure Laterality Date  . Carotid Doppler Ultrasound  04/02/2010   Small amount Calcified plaque bilaterally, no significant stenosis.  . COLONOSCOPY  05/11/2014   Tubular Adenoma. Dr. Allen Norris  . COLONOSCOPY WITH PROPOFOL N/A 05/23/2018   Procedure: COLONOSCOPY WITH PROPOFOL;  Surgeon: [name], MD;  Location: [facility] ENDOSCOPY;  Service: Gastroenterology;  Laterality: N/A;  . DOPPLER ECHOCARDIOGRAPHY  04/02/2010   Mild left atrial dilation. Normal right atrium. No valvular disease. No thrombus. Normal LV function. EF>55%  . FINGER AMPUTATION    . HERNIA REPAIR    . MRI Brain with and without contrast  03/20/2010   Suggestive of basal ganglia lacunar infarct  . PROSTATECTOMY  1995   Abdominal  . SKIN SURGERY     multiple  . Thumb surgery      Home Medications:  Allergies as of 01/24/2019      Reactions   Lisinopril Swelling   Tongue swelling Tongue swelling  Tongue swelling Tongue swelling  Tongue swelling    Myrbetriq [mirabegron] Swelling   Of the tongue   Atorvastatin Other (See Comments)   Dizziness and Fatigue Dizziness and Fatigue   Dabigatran Etexilate Mesylate    Stomach ulcers (Pradaxa)   Dabigatran Etexilate Mesylate Other (See Comments)   Stomach ulcers (Pradaxa)      Medication List       Accurate as of January 24, 2019  2:18 PM. Always use your most recent med list.        atenolol 50 MG tablet Commonly known as:  TENORMIN Take 1 tablet (50 mg total) by mouth daily.   BIOTIN PO Take by mouth daily.   calcium-vitamin D 250-125 MG-UNIT tablet Commonly known as:  OSCAL Take 1 tablet by mouth 2 (two) times daily.   chlorthalidone 25 MG tablet Commonly known as:  HYGROTON Take 1 tablet (25 mg total) by mouth daily.   cyclobenzaprine 10 MG tablet Commonly known as:  FLEXERIL Take  0.5 tablets (5 mg total) by mouth 2 (two) times daily as needed.   EpiPen 2-Pak 0.3 mg/0.3 mL Soaj injection Generic drug:  EPINEPHrine Inject 0.3 mg into the muscle once.   fesoterodine 4 MG Tb24 tablet Commonly known as:  TOVIAZ Take 1 tablet (4 mg total) by mouth daily.   flecainide 50 MG tablet Commonly known as:  TAMBOCOR Take 1 tablet (50 mg total) by mouth 2 (two) times daily.   gabapentin 100 MG capsule Commonly known as:  NEURONTIN Take 1 capsule (100  mg total) by mouth 3 (three) times daily.   Leuprolide Acetate (6 Month) 45 MG injection Commonly known as:  LUPRON Inject 45 mg into the muscle every 6 (six) months.   omeprazole 40 MG capsule Commonly known as:  PRILOSEC TAKE ONE CAPSULE DAILY.   rivaroxaban 20 MG Tabs tablet Commonly known as:  XARELTO Take 1 tablet (20 mg total) by mouth daily.   STOOL SOFTENER PO Take by mouth as needed.   traMADol 50 MG tablet Commonly known as:  ULTRAM Take 1 tablet (50 mg total) by mouth every 6 (six) hours as needed.   VITAMIN B-12 PO Take by mouth daily.   zolpidem 10 MG tablet Commonly known as:  AMBIEN TAKE ONE-HALF TO ONE TABLET AT BEDTIME.       Allergies:  Allergies  Allergen Reactions  . Lisinopril Swelling    Tongue swelling Tongue swelling  Tongue swelling Tongue swelling  Tongue swelling   . Myrbetriq [Mirabegron] Swelling    Of the tongue  . Atorvastatin Other (See Comments)    Dizziness and Fatigue  Dizziness and Fatigue  . Dabigatran Etexilate Mesylate     Stomach ulcers (Pradaxa)  . Dabigatran Etexilate Mesylate Other (See Comments)    Stomach ulcers (Pradaxa)    Family History: Family History  Problem Relation Age of Onset  . Heart disease Mother 25       CABG  . Hypertension Mother   . Heart attack Mother   . Hypertension Brother   . Heart disease Sister        stents  . Prostate cancer Brother   . Kidney cancer Brother   . Skin cancer Brother   . Hypertension Brother   .  Bladder Cancer Sister   . Leukemia Father   . Stroke Father        multiple  . Cerebral aneurysm Sister   . Kidney cancer Brother     Social History:  reports that he has been smoking. He has a 3.00 pack-year smoking history. His smokeless tobacco use includes chew. He reports current alcohol use of about 10.0 standard drinks of alcohol per week. He reports that he does not use drugs.  ROS: UROLOGY Frequent Urination?: No Hard to postpone urination?: No Burning/pain with urination?: No Get up at night to urinate?: No Leakage of urine?: Yes Urine stream starts and stops?: No Trouble starting stream?: No Do you have to strain to urinate?: No Blood in urine?: No Urinary tract infection?: No Sexually transmitted disease?: No Injury to kidneys or bladder?: No Painful intercourse?: No Weak stream?: Yes Erection problems?: No Penile pain?: No  Gastrointestinal Nausea?: No Vomiting?: No Indigestion/heartburn?: No Diarrhea?: No Constipation?: No  Constitutional Fever: No Night sweats?: No Weight loss?: No Fatigue?: Yes  Skin Skin rash/lesions?: No Itching?: No  Eyes Blurred vision?: No Double vision?: No  Ears/Nose/Throat Sore throat?: No Sinus problems?: No  Hematologic/Lymphatic Swollen glands?: No Easy bruising?: No  Cardiovascular Leg swelling?: No Chest pain?: No  Respiratory Cough?: No Shortness of breath?: No  Endocrine Excessive thirst?: No  Musculoskeletal Back pain?: No Joint pain?: No  Neurological Headaches?: No Dizziness?: No  Psychologic Depression?: No Anxiety?: No  Physical Exam: BP (!) 145/77   Pulse 62   Resp 14   Constitutional:  Well nourished. Alert and oriented, No acute distress. HEENT: [place] AT, moist mucus membranes.  Trachea midline. Cardiovascular: No clubbing, cyanosis, or edema. Respiratory: Normal respiratory effort, no increased work of breathing. Skin: No rashes, bruises  or suspicious lesions. Neurologic:  Grossly intact, no focal deficits, moving all 4 extremities. Psychiatric: Normal mood and affect.  Laboratory Data: Lab Results  Component Value Date   WBC 2.4 04/22/2018   HGB 12.5 (A) 04/22/2018   HCT 38 (A) 04/22/2018   MCV 86 04/17/2016   PLT 57 (A) 04/22/2018    Lab Results  Component Value Date   CREATININE 1.07 05/10/2018   Lab Results  Component Value Date   HGBA1C 6.3 (A) 10/04/2018   Lab Results  Component Value Date   AST 35 04/22/2018   Lab Results  Component Value Date   ALT 32 04/22/2018   Urinalysis Negative.  See Epic.  I have reviewed the labs.  Assessment & Plan:    1. History of hematuria - Hematuria work-up in June 2019 was negative - UA is negative  - No report of gross hematuria - RTC in 6 months    2. History of prostate cancer - PSA is undetectable - Lupron and PSA q 6 months   3. Incontinence - He is taking the Toviaz 4 mg prn; refill provided - Patient wishes to have a stock provided in case - he will try to be without the Toviaz to see if his urinary symptoms worsen, if not he may discontinue the medication   Return in about 6 months (around 07/27/2019) for IPSS, PSA, Lupron and exam.  Zara Council, [facility]  [place] [address] [place], [place] [address] [phone]  I, Adele Schilder, am acting as a Education administrator for [employer], PA-C.   I have reviewed the above documentation for accuracy and completeness, and I agree with the above.    Zara Council, PA-C

## 2019-01-24 ENCOUNTER — Telehealth: Payer: Self-pay | Admitting: Urology

## 2019-01-24 ENCOUNTER — Ambulatory Visit (INDEPENDENT_AMBULATORY_CARE_PROVIDER_SITE_OTHER): Payer: PPO | Admitting: Urology

## 2019-01-24 ENCOUNTER — Other Ambulatory Visit: Payer: Self-pay

## 2019-01-24 ENCOUNTER — Encounter: Payer: Self-pay | Admitting: Urology

## 2019-01-24 VITALS — BP 145/77 | HR 62 | Resp 14

## 2019-01-24 DIAGNOSIS — Z87448 Personal history of other diseases of urinary system: Secondary | ICD-10-CM | POA: Diagnosis not present

## 2019-01-24 DIAGNOSIS — C61 Malignant neoplasm of prostate: Secondary | ICD-10-CM | POA: Diagnosis not present

## 2019-01-24 DIAGNOSIS — N3946 Mixed incontinence: Secondary | ICD-10-CM

## 2019-01-24 LAB — BLADDER SCAN AMB NON-IMAGING: Scan Result: 68

## 2019-01-24 MED ORDER — FESOTERODINE FUMARATE ER 4 MG PO TB24: 4.0000 mg | ORAL_TABLET | Freq: Every day | ORAL | 3 refills | Status: DC

## 2019-01-24 MED ORDER — LEUPROLIDE ACETATE (6 MONTH) 45 MG IM KIT
45.0000 mg | PACK | Freq: Once | INTRAMUSCULAR | Status: AC
  Administered 2019-01-24: 45 mg via INTRAMUSCULAR

## 2019-01-24 NOTE — Telephone Encounter (Signed)
Patient had a 6 month Lupron injection today and will need another one in 6 months.

## 2019-01-24 NOTE — Progress Notes (Signed)
Lupron IM Injection   Due to Prostate Cancer patient is present today for a Lupron Injection.  Medication: Lupron 6 month Dose: 45 mg  Location: left upper outer buttocks Lot: [id number] Exp: 03/22/2021  Patient tolerated well, no complications were noted  Performed by: Lexus Shampine, CMA  Follow up: 6 month  

## 2019-01-25 LAB — URINALYSIS, COMPLETE
BILIRUBIN UA: NEGATIVE
Glucose, UA: NEGATIVE
Ketones, UA: NEGATIVE
Leukocytes, UA: NEGATIVE
Nitrite, UA: NEGATIVE
PH UA: 7 (ref 5.0–7.5)
PROTEIN UA: NEGATIVE
RBC, UA: NEGATIVE
SPEC GRAV UA: 1.02 (ref 1.005–1.030)
Urobilinogen, Ur: 1 mg/dL (ref 0.2–1.0)

## 2019-03-10 ENCOUNTER — Other Ambulatory Visit: Payer: Self-pay | Admitting: Family Medicine

## 2019-03-21 ENCOUNTER — Telehealth: Payer: Self-pay | Admitting: Urology

## 2019-03-21 NOTE — Telephone Encounter (Signed)
NO PA REQUIRED FOR LUPRON   MICHELLE

## 2019-03-23 ENCOUNTER — Telehealth: Payer: Self-pay

## 2019-03-23 NOTE — Telephone Encounter (Signed)
Called patient from recall list.  Patient stated He is seeing Dr. Saralyn Pilar at this time and he will not be back to see Dr. Rockey Situ.  He stated it is ridiculous the amount of time you have to wait on him and if we can not improve that he will not be back.  Made him aware that Dr. Rockey Situ is a good doctor and takes time with each of his patients.  Will delete recall.

## 2019-05-01 ENCOUNTER — Encounter: Payer: PPO | Admitting: Family Medicine

## 2019-05-03 DIAGNOSIS — D485 Neoplasm of uncertain behavior of skin: Secondary | ICD-10-CM | POA: Diagnosis not present

## 2019-05-03 DIAGNOSIS — L570 Actinic keratosis: Secondary | ICD-10-CM | POA: Diagnosis not present

## 2019-05-03 DIAGNOSIS — C44311 Basal cell carcinoma of skin of nose: Secondary | ICD-10-CM | POA: Diagnosis not present

## 2019-05-09 ENCOUNTER — Ambulatory Visit (INDEPENDENT_AMBULATORY_CARE_PROVIDER_SITE_OTHER): Payer: PPO

## 2019-05-09 ENCOUNTER — Ambulatory Visit (INDEPENDENT_AMBULATORY_CARE_PROVIDER_SITE_OTHER): Payer: PPO | Admitting: Family Medicine

## 2019-05-09 ENCOUNTER — Other Ambulatory Visit: Payer: Self-pay

## 2019-05-09 VITALS — BP 134/70 | HR 65 | Temp 99.3°F | Ht 70.0 in | Wt 201.8 lb

## 2019-05-09 DIAGNOSIS — E1149 Type 2 diabetes mellitus with other diabetic neurological complication: Secondary | ICD-10-CM

## 2019-05-09 DIAGNOSIS — Z0000 Encounter for general adult medical examination without abnormal findings: Secondary | ICD-10-CM

## 2019-05-09 DIAGNOSIS — E781 Pure hyperglyceridemia: Secondary | ICD-10-CM

## 2019-05-09 DIAGNOSIS — I700 Atherosclerosis of aorta: Secondary | ICD-10-CM | POA: Diagnosis not present

## 2019-05-09 DIAGNOSIS — I10 Essential (primary) hypertension: Secondary | ICD-10-CM

## 2019-05-09 DIAGNOSIS — I480 Paroxysmal atrial fibrillation: Secondary | ICD-10-CM | POA: Diagnosis not present

## 2019-05-09 NOTE — Patient Instructions (Addendum)
Mr. Ian Sanders , Thank you for taking time to come for your Medicare Wellness Visit. I appreciate your ongoing commitment to your health goals. Please review the following plan we discussed and let me know if I can assist you in the future.   Screening recommendations/referrals: Colonoscopy: Up to date, due 05/2023 Recommended yearly ophthalmology/optometry visit for glaucoma screening and checkup Recommended yearly dental visit for hygiene and checkup  Vaccinations: Influenza vaccine: Up to date Pneumococcal vaccine: Completed series Tdap vaccine: Up to date, due 05/2026 Shingles vaccine: Pt declines today.     Advanced directives: Currently on file.   Conditions/risks identified: Fall risk prevention discussed today. Continue to increase water intake and try to walk 3 days a week for at least 30 minutes at a time.   Next appointment: 2:00 PM with Dr Caryn Section. Declined scheduling an AWV or CPE for next year at this time.   Preventive Care 9 Years and Older, Male Preventive care refers to lifestyle choices and visits with your health care provider that can promote health and wellness. What does preventive care include?  A yearly physical exam. This is also called an annual well check.  Dental exams once or twice a year.  Routine eye exams. Ask your health care provider how often you should have your eyes checked.  Personal lifestyle choices, including:  Daily care of your teeth and gums.  Regular physical activity.  Eating a healthy diet.  Avoiding tobacco and drug use.  Limiting alcohol use.  Practicing safe sex.  Taking low doses of aspirin every day.  Taking vitamin and mineral supplements as recommended by your health care provider. What happens during an annual well check? The services and screenings done by your health care provider during your annual well check will depend on your age, overall health, lifestyle risk factors, and family history of disease.  Counseling  Your health care provider may ask you questions about your:  Alcohol use.  Tobacco use.  Drug use.  Emotional well-being.  Home and relationship well-being.  Sexual activity.  Eating habits.  History of falls.  Memory and ability to understand (cognition).  Work and work Statistician. Screening  You may have the following tests or measurements:  Height, weight, and BMI.  Blood pressure.  Lipid and cholesterol levels. These may be checked every 5 years, or more frequently if you are over 110 years old.  Skin check.  Lung cancer screening. You may have this screening every year starting at age 50 if you have a 30-pack-year history of smoking and currently smoke or have quit within the past 15 years.  Fecal occult blood test (FOBT) of the stool. You may have this test every year starting at age 36.  Flexible sigmoidoscopy or colonoscopy. You may have a sigmoidoscopy every 5 years or a colonoscopy every 10 years starting at age 54.  Prostate cancer screening. Recommendations will vary depending on your family history and other risks.  Hepatitis C blood test.  Hepatitis B blood test.  Sexually transmitted disease (STD) testing.  Diabetes screening. This is done by checking your blood sugar (glucose) after you have not eaten for a while (fasting). You may have this done every 1-3 years.  Abdominal aortic aneurysm (AAA) screening. You may need this if you are a current or former smoker.  Osteoporosis. You may be screened starting at age 63 if you are at high risk. Talk with your health care provider about your test results, treatment options, and if necessary, the  need for more tests. Vaccines  Your health care provider may recommend certain vaccines, such as:  Influenza vaccine. This is recommended every year.  Tetanus, diphtheria, and acellular pertussis (Tdap, Td) vaccine. You may need a Td booster every 10 years.  Zoster vaccine. You may need this  after age 47.  Pneumococcal 13-valent conjugate (PCV13) vaccine. One dose is recommended after age 48.  Pneumococcal polysaccharide (PPSV23) vaccine. One dose is recommended after age 23. Talk to your health care provider about which screenings and vaccines you need and how often you need them. This information is not intended to replace advice given to you by your health care provider. Make sure you discuss any questions you have with your health care provider. Document Released: 11/29/2015 Document Revised: 07/22/2016 Document Reviewed: 09/03/2015 Elsevier Interactive Patient Education  2017 [place] Prevention in the Home Falls can cause injuries. They can happen to people of all ages. There are many things you can do to make your home safe and to help prevent falls. What can I do on the outside of my home?  Regularly fix the edges of walkways and driveways and fix any cracks.  Remove anything that might make you trip as you walk through a door, such as a raised step or threshold.  Trim any bushes or trees on the path to your home.  Use bright outdoor lighting.  Clear any walking paths of anything that might make someone trip, such as rocks or tools.  Regularly check to see if handrails are loose or broken. Make sure that both sides of any steps have handrails.  Any raised decks and porches should have guardrails on the edges.  Have any leaves, snow, or ice cleared regularly.  Use sand or salt on walking paths during winter.  Clean up any spills in your garage right away. This includes oil or grease spills. What can I do in the bathroom?  Use night lights.  Install grab bars by the toilet and in the tub and shower. Do not use towel bars as grab bars.  Use non-skid mats or decals in the tub or shower.  If you need to sit down in the shower, use a plastic, non-slip stool.  Keep the floor dry. Clean up any water that spills on the floor as soon as it happens.   Remove soap buildup in the tub or shower regularly.  Attach bath mats securely with double-sided non-slip rug tape.  Do not have throw rugs and other things on the floor that can make you trip. What can I do in the bedroom?  Use night lights.  Make sure that you have a light by your bed that is easy to reach.  Do not use any sheets or blankets that are too big for your bed. They should not hang down onto the floor.  Have a firm chair that has side arms. You can use this for support while you get dressed.  Do not have throw rugs and other things on the floor that can make you trip. What can I do in the kitchen?  Clean up any spills right away.  Avoid walking on wet floors.  Keep items that you use a lot in easy-to-reach places.  If you need to reach something above you, use a strong step stool that has a grab bar.  Keep electrical cords out of the way.  Do not use floor polish or wax that makes floors slippery. If you must use wax,  use non-skid floor wax.  Do not have throw rugs and other things on the floor that can make you trip. What can I do with my stairs?  Do not leave any items on the stairs.  Make sure that there are handrails on both sides of the stairs and use them. Fix handrails that are broken or loose. Make sure that handrails are as long as the stairways.  Check any carpeting to make sure that it is firmly attached to the stairs. Fix any carpet that is loose or worn.  Avoid having throw rugs at the top or bottom of the stairs. If you do have throw rugs, attach them to the floor with carpet tape.  Make sure that you have a light switch at the top of the stairs and the bottom of the stairs. If you do not have them, ask someone to add them for you. What else can I do to help prevent falls?  Wear shoes that:  Do not have high heels.  Have rubber bottoms.  Are comfortable and fit you well.  Are closed at the toe. Do not wear sandals.  If you use a  stepladder:  Make sure that it is fully opened. Do not climb a closed stepladder.  Make sure that both sides of the stepladder are locked into place.  Ask someone to hold it for you, if possible.  Clearly mark and make sure that you can see:  Any grab bars or handrails.  First and last steps.  Where the edge of each step is.  Use tools that help you move around (mobility aids) if they are needed. These include:  Canes.  Walkers.  Scooters.  Crutches.  Turn on the lights when you go into a dark area. Replace any light bulbs as soon as they burn out.  Set up your furniture so you have a clear path. Avoid moving your furniture around.  If any of your floors are uneven, fix them.  If there are any pets around you, be aware of where they are.  Review your medicines with your doctor. Some medicines can make you feel dizzy. This can increase your chance of falling. Ask your doctor what other things that you can do to help prevent falls. This information is not intended to replace advice given to you by your health care provider. Make sure you discuss any questions you have with your health care provider. Document Released: 08/29/2009 Document Revised: 04/09/2016 Document Reviewed: 12/07/2014 Elsevier Interactive Patient Education  2017 [employer].

## 2019-05-09 NOTE — Patient Instructions (Addendum)
.   Please review the attached list of medications and notify my office if there are any errors.   . Please bring all of your medications to every appointment so we can make sure that our medication list is the same as yours.   . Please go to the lab draw station in [address] on the second floor of [facility]  when you are fasting for 8 hours. Normal hours are 8:00am to 12:30pm and 1:30pm to 4:00pm Monday through Friday   The CDC recommends two doses of Shingrix (the shingles vaccine) separated by 2 to 6 months for adults age 79 years and older. I recommend checking with your insurance plan regarding coverage for this vaccine.

## 2019-05-09 NOTE — Progress Notes (Signed)
Subjective:   Ian Sanders is a 78 y.o. male who presents for Medicare Annual/Subsequent preventive examination.  Review of Systems:  N/A  Cardiac Risk Factors include: advanced age (>64men, >79 women);diabetes mellitus;hypertension;male gender;smoking/ tobacco exposure     Objective:    Vitals: BP 134/70 (BP Location: Right Arm)   Pulse 65   Temp 99.3 F (37.4 C) (Oral)   Ht 5\' 10"  (1.778 m)   Wt 201 lb 12.8 oz (91.5 kg)   BMI 28.96 kg/m   Body mass index is 28.96 kg/m.  Advanced Directives 05/09/2019 05/23/2018 05/02/2018 01/14/2018 04/20/2017 04/02/2015  Does Patient Have a Medical Advance Directive? Yes Yes Yes Yes Yes Yes  Type of Paramedic of [place];Living will [place];Living will [place];Living will [place];Living will [place];Living will Living will  Does patient want to make changes to medical advance directive? - - - - - No - Patient declined  Copy of Rote in Chart? Yes - validated most recent copy scanned in chart (See row information) No - copy requested Yes No - copy requested No - copy requested -    Tobacco Social History   Tobacco Use  Smoking Status Former Smoker  . Packs/day: 0.00  . Years: 12.00  . Pack years: 0.00  . Types: Cigarettes  Smokeless Tobacco Current User  . 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  No  Did the patient bring in their glucometer from home?  No  How often do you monitor your CBG's? Does not check BS.   Financial Strains and Diabetes Management:  Are you having any financial strains with the device, your supplies or your medication? No .  Does the patient want to be seen by Chronic Care Management for management of their diabetes?  No  Would the patient like to be referred to a Nutritionist or for Diabetic Management?  No   Diabetic Exams:  Diabetic Eye Exam: Completed 01/14/17. Overdue for diabetic eye exam. Pt has been advised about the importance in completing this exam. Pt plans to set up an eye exam this year.   Diabetic Foot Exam: Completed within the last year. Pt has been advised about the importance in completing this exam. Note made to follow up on this at today's OV.   Interpreter Needed?: No  Information entered by :: [facility] LPN  Past Medical History:  Diagnosis Date  . Atrial fibrillation ([place])   . Basal cell carcinoma   . Benign paroxysmal positional vertigo   . Cancer ([place])   . History of chicken pox   . History of measles   . History of mumps   . Hypertension   . Mononeuritis of unspecified site   . Other seborrheic keratosis   . Stroke ([place])   . 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 Unspecified hypothyroidism    Past Surgical History:  Procedure Laterality Date  . Carotid Doppler Ultrasound  04/02/2010   Small amount Calcified plaque bilaterally, no significant stenosis.  . COLONOSCOPY  05/11/2014   Tubular Adenoma. Dr. Allen Norris  . COLONOSCOPY WITH PROPOFOL N/A 05/23/2018   Procedure: COLONOSCOPY WITH PROPOFOL;  Surgeon: [name], MD;  Location: [facility] ENDOSCOPY;  Service: Gastroenterology;  Laterality: N/A;  . DOPPLER ECHOCARDIOGRAPHY  04/02/2010   Mild left atrial dilation. Normal right atrium. No valvular disease. No thrombus. Normal LV  function. EF>55%  . FINGER AMPUTATION    . HERNIA REPAIR    . MRI Brain with and without contrast  03/20/2010   Suggestive of basal ganglia lacunar infarct  . PROSTATECTOMY  1995   Abdominal  . SKIN SURGERY     multiple  . Thumb surgery     Family History  Problem Relation Age of Onset  . Heart disease Mother 22       CABG  . Hypertension Mother   . Heart attack Mother   . Hypertension Brother   . Heart disease Sister        stents  . Prostate cancer Brother   . Kidney cancer Brother   . Skin cancer Brother   . Hypertension Brother   . Bladder Cancer Sister   . Leukemia Father   . Stroke Father        multiple  . Cerebral aneurysm Sister   . Kidney cancer Brother    Social History   Socioeconomic History  . Marital status: Married    Spouse name: Not on file  . Number of children: 2  . Years of education: Not on file  . Highest education level: Associate degree: occupational, Hotel manager, or vocational program  Occupational History  . Occupation: Retired  Scientific laboratory technician  . Financial resource strain: Not hard at all  . Food insecurity    Worry: Never true    Inability: Never true  . Transportation needs    Medical: No    Non-medical: No  Tobacco Use  . Smoking status: Former Smoker    Packs/day: 0.00    Years: 12.00    Pack years: 0.00    Types: Cigarettes  . Smokeless tobacco: Current User    Types: Chew  . Tobacco comment: quit over 45 years ago  Substance and Sexual Activity  . Alcohol use: Yes    Alcohol/week: 5.0 standard drinks    Types: 5 Cans of beer per week  . Drug use: No  . Sexual activity: Not on file  Lifestyle  . Physical activity    Days per week: 0 days    Minutes per session: 0 min  . Stress: Not at all  Relationships  . Social Herbalist on phone: Patient refused    Gets together: Patient refused    Attends religious service: Patient refused    Active member of club or organization: Patient refused    Attends meetings  of clubs or organizations: Patient refused    Relationship status: Patient refused  Other Topics Concern  . Not on file  Social History Narrative  . Not on file    Outpatient Encounter Medications as of 05/09/2019  Medication Sig  . atenolol (TENORMIN) 50 MG tablet Take 1 tablet (50 mg total) by mouth daily.  . calcium-vitamin D (OSCAL) 250-125 MG-UNIT per tablet Take 1 tablet by mouth 2 (two) times daily.   Marland Kitchen  chlorthalidone (HYGROTON) 25 MG tablet Take 1 tablet (25 mg total) by mouth daily. (Patient taking differently: Take 25 mg by mouth every other day. )  . Cyanocobalamin (VITAMIN B-12 PO) Take by mouth daily.  . cyclobenzaprine (FLEXERIL) 10 MG tablet Take 0.5 tablets (5 mg total) by mouth 2 (two) times daily as needed.  Mariane Baumgarten Calcium (STOOL SOFTENER PO) Take by mouth as needed.  Marland Kitchen EPINEPHrine (EPIPEN 2-PAK) 0.3 mg/0.3 mL IJ SOAJ injection Inject 0.3 mg into the muscle once.   . flecainide (TAMBOCOR) 50 MG tablet Take 1 tablet (50 mg total) by mouth 2 (two) times daily.  Marland Kitchen gabapentin (NEURONTIN) 100 MG capsule Take 1 capsule (100 mg total) by mouth 3 (three) times daily.  Marland Kitchen Leuprolide Acetate, 6 Month, (LUPRON) 45 MG injection Inject 45 mg into the muscle every 6 (six) months.  Marland Kitchen omeprazole (PRILOSEC) 40 MG capsule TAKE ONE CAPSULE DAILY.  . rivaroxaban (XARELTO) 20 MG TABS tablet Take 1 tablet (20 mg total) by mouth daily.  . traMADol (ULTRAM) 50 MG tablet Take 1 tablet (50 mg total) by mouth every 6 (six) hours as needed.  . zolpidem (AMBIEN) 10 MG tablet TAKE ONE-HALF TO ONE TABLET AT BEDTIME.  Marland Kitchen BIOTIN PO Take by mouth daily.  . fesoterodine (TOVIAZ) 4 MG TB24 tablet Take 1 tablet (4 mg total) by mouth daily. (Patient not taking: Reported on 05/09/2019)   Facility-Administered Encounter Medications as of 05/09/2019  Medication  . lidocaine (XYLOCAINE) 2 % jelly 1 application    Activities of Daily Living In your present state of health, do you have any difficulty  performing the following activities: 05/09/2019  Hearing? N  Vision? Y  Comment Due to blurring in vision when focusing. Wears glasses daily.  Difficulty concentrating or making decisions? N  Walking or climbing stairs? N  Dressing or bathing? N  Doing errands, shopping? N  Preparing Food and eating ? N  Using the Toilet? N  In the past six months, have you accidently leaked urine? Y  Comment Rarely- does not wear protection.  Do you have problems with loss of bowel control? N  Managing your Medications? N  Managing your Finances? N  Housekeeping or managing your Housekeeping? N  Some recent data might be hidden    Patient Care Team: Birdie Sons, MD as PCP - General (Family Medicine) Laneta Simmers as Physician Assistant (Urology) Isaias Cowman, MD as Consulting Physician (Cardiology) Jannet Mantis, MD (Dermatology)   Assessment:   This is a routine wellness examination for Ian Sanders.  Exercise Activities and Dietary recommendations Current Exercise Habits: The patient does not participate in regular exercise at present, Exercise limited by: None identified  Goals    . DIET - INCREASE WATER INTAKE     Recommend increasing water intake to 4-6 glasses a day.     . Exercise 3x per week (30 min per time)     Recommend increasing exercise. Pt to work up to walking 3 days a week for 30 minutes.     . Prevent falls     Recommend to remove any items from the home that may cause slips or trips.       Fall Risk Fall Risk  05/09/2019 05/02/2018 04/20/2017 04/17/2016  Falls in the past year? 1 Yes No No  Number falls in past yr: 1 1 - -  Injury with Fall? 0 No - -  Follow up Falls prevention discussed Falls prevention discussed - -  FALL RISK PREVENTION PERTAINING TO THE HOME:  Any stairs in or around the home? Yes  If so, are there any without handrails? No   Home free of loose throw rugs in walkways, pet beds, electrical cords, etc? Yes  Adequate  lighting in your home to reduce risk of falls? Yes   ASSISTIVE DEVICES UTILIZED TO PREVENT FALLS:  Life alert? Yes  Use of a cane, walker or w/c? Yes  Grab bars in the bathroom? Yes  Shower chair or bench in shower? No  Elevated toilet seat or a handicapped toilet? No    TIMED UP AND GO:  Was the test performed? No .    Depression Screen PHQ 2/9 Scores 05/09/2019 05/09/2019 05/02/2018 05/02/2018  PHQ - 2 Score 0 0 1 1  PHQ- 9 Score 5 - 6 -    Cognitive Function: Declined today.      6CIT Screen 05/02/2018 04/20/2017  What Year? 0 points 0 points  What month? 0 points 0 points  What time? 0 points 0 points  Count back from 20 0 points 0 points  Months in reverse 2 points 2 points  Repeat phrase 4 points 0 points  Total Score 6 2    Immunization History  Administered Date(s) Administered  . Influenza, High Dose Seasonal PF 08/09/2015, 07/23/2016, 08/26/2018  . Pneumococcal Conjugate-13 12/15/2013  . Pneumococcal Polysaccharide-23 11/16/2006  . Td 05/29/2016    Qualifies for Shingles Vaccine? Yes . Due for Shingrix. Education has been provided regarding the importance of this vaccine. Pt has been advised to call insurance company to determine out of pocket expense. Advised may also receive vaccine at local pharmacy or Health Dept. Verbalized acceptance and understanding.  Tdap: Up to date  Flu Vaccine: Up to date  Pneumococcal Vaccine: Completed series  Screening Tests Health Maintenance  Topic Date Due  . OPHTHALMOLOGY EXAM  01/14/2018  . FOOT EXAM  04/27/2018  . URINE MICROALBUMIN  04/27/2018  . HEMOGLOBIN A1C  04/04/2019  . INFLUENZA VACCINE  06/17/2019  . COLONOSCOPY  05/24/2023  . TETANUS/TDAP  05/29/2026  . PNA vac Low Risk Adult  Completed   Cancer Screenings:  Colorectal Screening: Completed 05/23/18. Repeat every 5 years.  Lung Cancer Screening: (Low Dose CT Chest recommended if Age 41-80 years, 30 pack-year currently smoking OR have quit w/in  15years.) does not qualify.   Additional Screening:  Dental Screening: Recommended annual dental exams for proper oral hygiene  Community Resource Referral:  CRR required this visit?  No        Plan:  I have personally reviewed and addressed the Medicare Annual Wellness questionnaire and have noted the following in the patient's chart:  A. Medical and social history B. Use of alcohol, tobacco or illicit drugs  C. Current medications and supplements D. Functional ability and status E.  Nutritional status F.  Physical activity G. Advance directives H. List of other physicians I.  Hospitalizations, surgeries, and ER visits in previous 12 months J.  [place] such as hearing and vision if needed, cognitive and depression L. Referrals and appointments   In addition, I have reviewed and discussed with patient certain preventive protocols, quality metrics, and best practice recommendations. A written personalized care plan for preventive services as well as general preventive health recommendations were provided to patient.   Glendora Score, [place]  12/28/9415 Nurse Health Advisor  Nurse Notes: Pt needs a diabetic foot exam, urine check and Hgb A1c checked today. Pt  plans to set up an eye exam this year.

## 2019-05-09 NOTE — Progress Notes (Signed)
Patient: Ian Sanders, Male    DOB: January 23, 1941, 78 y.o.   MRN: [id number] Visit Date: 05/09/2019  Today's Provider: Lelon Huh, MD   Chief Complaint  Patient presents with  . Annual Exam  . Diabetes  . Hypertension   Subjective:     Complete Physical Ian Sanders is a 78 y.o. male. He feels fairly well. He reports no regular exercising. He reports he is sleeping fairly well.  -----------------------------------------------------------  Follow up for Neuropathy:  The patient was last seen for this 7 months ago. Changes made at last visit include none.  He reports good compliance with treatment. He feels that condition is Unchanged. He is not having side effects.   ------------------------------------------------------------------------------------  Diabetes Mellitus Type II, Follow-up:   Lab Results  Component Value Date   HGBA1C 6.3 (A) 10/04/2018   HGBA1C 6.2 (A) 05/02/2018   HGBA1C 5.4 04/20/2017    Last seen for diabetes 7 months ago.  Management since then includes no changes. He reports good compliance with treatment. He is not having side effects.  Current symptoms include none and have been stable. Home blood sugar records: blood sugars are not checked  Episodes of hypoglycemia? no   Current Insulin Regimen: noe Most Recent Eye Exam: 1 year ago Weight trend: fluctuating a bit Prior visit with dietician: No Current exercise: none Current diet habits: in general, an "unhealthy" diet  Pertinent Labs:    Component Value Date/Time   CHOL 115 04/20/2017 1119   TRIG 220 (H) 04/20/2017 1119   HDL 31 (L) 04/20/2017 1119   LDLCALC 40 04/20/2017 1119   CREATININE 1.07 05/10/2018 1345   CREATININE 1.36 (H) 08/18/2012 0108    Wt Readings from Last 3 Encounters:  05/09/19 201 lb 12.8 oz (91.5 kg)  12/14/18 197 lb (89.4 kg)  10/04/18 195 lb (88.5 kg)    ------------------------------------------------------------------------   Hypertension, follow-up:  BP Readings from Last 3 Encounters:  05/09/19 134/70  01/24/19 (!) 145/77  12/14/18 (!) 149/84    He was last seen for hypertension 7 months ago.  BP at that visit was 126/62. Management since that visit includes no changes. He reports good compliance with treatment. He is not having side effects.  He is not exercising. He is adherent to low salt diet.   Outside blood pressures are normal per patient report. He is experiencing none.  Patient denies chest pain, chest pressure/discomfort, claudication, dyspnea, exertional chest pressure/discomfort, fatigue, irregular heart beat, lower extremity edema, near-syncope, orthopnea, palpitations, paroxysmal nocturnal dyspnea, syncope and tachypnea.   Cardiovascular risk factors include advanced age (older than 49 for men, 25 for women), diabetes mellitus, hypertension and male gender.  Use of agents associated with hypertension: none.     Weight trend: fluctuating a bit Wt Readings from Last 3 Encounters:  05/09/19 201 lb 12.8 oz (91.5 kg)  12/14/18 197 lb (89.4 kg)  10/04/18 195 lb (88.5 kg)    Current diet: in general, an "unhealthy" diet  ------------------------------------------------------------------------   Review of Systems  Constitutional: Negative for appetite change, chills, fatigue and fever.  HENT: Negative for congestion, ear pain, hearing loss, nosebleeds and trouble swallowing.   Eyes: Negative for pain and visual disturbance.  Respiratory: Negative for cough, chest tightness and shortness of breath.   Cardiovascular: Negative for chest pain, palpitations and leg swelling.  Gastrointestinal: Negative for abdominal pain, blood in stool, constipation, diarrhea, nausea and vomiting.  Endocrine: Negative for polydipsia, polyphagia and polyuria.  Genitourinary:  Negative for dysuria and flank pain.  Musculoskeletal: Positive for joint swelling (left foot). Negative for arthralgias, back pain,  myalgias and neck stiffness.  Skin: Negative for color change, rash and wound.  Neurological: Negative for dizziness, tremors, seizures, speech difficulty, weakness, light-headedness and headaches.  Psychiatric/Behavioral: Negative for behavioral problems, confusion, decreased concentration, dysphoric mood and sleep disturbance. The patient is not nervous/anxious.   All other systems reviewed and are negative.   Social History   Socioeconomic History  . Marital status: Married    Spouse name: Not on file  . Number of children: 2  . Years of education: Not on file  . Highest education level: Associate degree: occupational, Hotel manager, or vocational program  Occupational History  . Occupation: Retired  Scientific laboratory technician  . Financial resource strain: Not hard at all  . Food insecurity    Worry: Never true    Inability: Never true  . Transportation needs    Medical: No    Non-medical: No  Tobacco Use  . Smoking status: Former Smoker    Packs/day: 0.00    Years: 12.00    Pack years: 0.00    Types: Cigarettes  . Smokeless tobacco: Current User    Types: Chew  . Tobacco comment: quit over 45 years ago  Substance and Sexual Activity  . Alcohol use: Yes    Alcohol/week: 5.0 standard drinks    Types: 5 Cans of beer per week  . Drug use: No  . Sexual activity: Not on file  Lifestyle  . Physical activity    Days per week: 0 days    Minutes per session: 0 min  . Stress: Not at all  Relationships  . Social Herbalist on phone: Patient refused    Gets together: Patient refused    Attends religious service: Patient refused    Active member of club or organization: Patient refused    Attends meetings of clubs or organizations: Patient refused    Relationship status: Patient refused  . Intimate partner violence    Fear of current or ex partner: Patient refused    Emotionally abused: Patient refused    Physically abused: Patient refused    Forced sexual activity: Patient  refused  Other Topics Concern  . Not on file  Social History Narrative  . Not on file    Past Medical History:  Diagnosis Date  . Atrial fibrillation ([place])   . Basal cell carcinoma   . Benign paroxysmal positional vertigo   . Cancer ([place])   . History of chicken pox   . History of measles   . History of mumps   . Hypertension   . Mononeuritis of unspecified site   . Other seborrheic keratosis   . Stroke ([place])   . 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 Unspecified hypothyroidism      Patient Active Problem List   Diagnosis Date Noted  . History of adenomatous polyp of colon 05/02/2018  . Paroxysmal atrial fibrillation ([place]) 07/14/2017  . Weight loss 10/30/2016  . Encounter for anticoagulation discussion and counseling 10/30/2016  . Cornu cutaneum 06/29/2016  . Laceration of ankle 06/29/2016  . LBP (low back pain) 06/29/2016  . Glossal swelling 06/29/2016  . Aortic atherosclerosis ([place]) 05/05/2016  . Fatty liver 05/05/2016  . Depression 04/17/2016  . Regular alcohol consumption 04/17/2016  . Neuropathy 04/16/2016  . Hernia, inguinal, right 04/16/2016  . Melena 04/16/2016  . Elevated liver function  tests 04/16/2016  . Tobacco abuse 04/16/2016  . Urge incontinence 07/17/2015  . Incontinence 06/20/2015  . Prostate cancer ([place]) 06/20/2015  . Type 2 diabetes mellitus with neurological complications ([place]) 09/73/5329  . Groin pain 02/21/2013  . 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 HTN (hypertension) 12/14/2011  . Hypothyroidism 05/13/2010  . Sacroiliac sprain 04/28/2010  . History of CVA (cerebrovascular accident) 03/21/2010  . Headache 03/10/2010  . Benign paroxysmal positional vertigo 01/15/2010  . Insomnia 05/09/2009  . Allergic rhinitis 05/09/2009  . Esophageal reflux 05/09/2009  . 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  Carotid Doppler Ultrasound  04/02/2010   Small amount Calcified plaque bilaterally, no significant stenosis.  . COLONOSCOPY  05/11/2014   Tubular Adenoma. Dr. Allen Norris  . COLONOSCOPY WITH PROPOFOL N/A 05/23/2018   Procedure: COLONOSCOPY WITH PROPOFOL;  Surgeon: [name], MD;  [facility]COPY;  Service: Gastroenterology;  Laterality: N/A;  . DOPPLER ECHOCARDIOGRAPHY  04/02/2010   Mild left atrial dilation. Normal right atrium. No valvular disease. No thrombus. Normal LV function. EF>55%  . FINGER AMPUTATION    . HERNIA REPAIR    . MRI Brain with and without contrast  03/20/2010   Suggestive of basal ganglia lacunar infarct  . PROSTATECTOMY  1995   Abdominal  . SKIN SURGERY     multiple  . Thumb surgery      His family history includes Bladder Cancer in his sister; Cerebral aneurysm in his sister; Heart attack in his mother; Heart disease in his sister; Heart disease (age of onset: 40) in his mother; Hypertension in his brother, brother, and mother; Kidney cancer in his brother and brother; Leukemia in his father; Prostate cancer in his brother; Skin cancer in his brother; Stroke in his father.   Current Outpatient Medications:  .  atenolol (TENORMIN) 50 MG tablet, Take 1 tablet (50 mg total) by mouth daily., Disp: 30 tablet, Rfl: 0 .  BIOTIN PO, Take by mouth daily., Disp: , Rfl:  .  calcium-vitamin D (OSCAL) 250-125 MG-UNIT per tablet, Take 1 tablet by mouth 2 (two) times daily. , Disp: , Rfl:  .  chlorthalidone (HYGROTON) 25 MG tablet, Take 1 tablet (25 mg total) by mouth daily. (Patient taking differently: Take 25 mg by mouth every other day. ), Disp: 90 tablet, Rfl: 3 .  Cyanocobalamin (VITAMIN B-12 PO), Take by mouth daily., Disp: , Rfl:  .  cyclobenzaprine (FLEXERIL) 10 MG tablet, Take 0.5 tablets (5 mg total) by mouth 2 (two) times daily as needed., Disp: 30 tablet, Rfl: 3 .  Docusate Calcium (STOOL SOFTENER PO), Take by mouth as needed., Disp: , Rfl:  .  EPINEPHrine (EPIPEN  2-PAK) 0.3 mg/0.3 mL IJ SOAJ injection, Inject 0.3 mg into the muscle once. , Disp: , Rfl:  .  fesoterodine (TOVIAZ) 4 MG TB24 tablet, Take 1 tablet (4 mg total) by mouth daily. (Patient not taking: Reported on 05/09/2019), Disp: 30 tablet, Rfl: 3 .  flecainide (TAMBOCOR) 50 MG tablet, Take 1 tablet (50 mg total) by mouth 2 (two) times daily., Disp: 180 tablet, Rfl: 3 .  gabapentin (NEURONTIN) 100 MG capsule, Take 1 capsule (100 mg total) by mouth 3 (three) times daily., Disp: 270 capsule, Rfl: 3 .  Leuprolide Acetate, 6 Month, (LUPRON) 45 MG injection, Inject 45 mg into the muscle every 6 (six) months., Disp: , Rfl:  .  omeprazole (PRILOSEC) 40 MG capsule, TAKE ONE CAPSULE DAILY., Disp:  90 capsule, Rfl: 4 .  rivaroxaban (XARELTO) 20 MG TABS tablet, Take 1 tablet (20 mg total) by mouth daily., Disp: 30 tablet, Rfl: 11 .  traMADol (ULTRAM) 50 MG tablet, Take 1 tablet (50 mg total) by mouth every 6 (six) hours as needed., Disp: 30 tablet, Rfl: 1 .  zolpidem (AMBIEN) 10 MG tablet, TAKE ONE-HALF TO ONE TABLET AT BEDTIME., Disp: 90 tablet, Rfl: 0  Current Facility-Administered Medications:  .  lidocaine (XYLOCAINE) 2 % jelly 1 application, 1 application, Urethral, Once, Stoioff, Ronda Fairly, MD  Patient Care Team: Birdie Sons, MD as PCP - General (Family Medicine) Nori Riis, PA-C as Physician Assistant (Urology) Isaias Cowman, MD as Consulting Physician (Cardiology) Jannet Mantis, MD (Dermatology)     Objective:    Vitals: There were no vitals taken for this visit.   BP 134/70 (BP Location: Right Arm)  Pulse 65  Temp 99.3 F (37.4 C) (Oral)  Ht 5\' 10"  (1.778 m)  Wt 201 lb 12.8 oz (91.5 kg)  BMI 28.96 kg/m  BSA 2.13 m  Pain [place] 0-No pain    More Vitals    Physical Exam   General Appearance:    Alert, cooperative, no distress, appears stated age  Head:    Normocephalic, without obvious abnormality, atraumatic  Eyes:    PERRL, conjunctiva/corneas clear, EOM's  intact, fundi    benign, both eyes       Ears:    Normal TM's and external ear canals, both ears  Nose:   Nares normal, septum midline, mucosa normal, no drainage   or sinus tenderness  Throat:   Lips, mucosa, and tongue normal; teeth and gums normal  Neck:   Supple, symmetrical, trachea midline, no adenopathy;       thyroid:  No enlargement/tenderness/nodules; no carotid   bruit or JVD  Back:     Symmetric, no curvature, ROM normal, no CVA tenderness  Lungs:     Clear to auscultation bilaterally, respirations unlabored  Chest wall:    No tenderness or deformity  Heart:    Regular rate and rhythm, S1 and S2 normal, no murmur, rub   or gallop  Abdomen:     Soft, non-tender, bowel sounds active all four quadrants,    no masses, no organomegaly  Genitalia:    deferred  Rectal:    deferred  Extremities:   Extremities normal, atraumatic, no cyanosis or edema  Pulses:   2+ and symmetric all extremities  Skin:   Skin color, texture, turgor normal, no rashes or lesions  Lymph nodes:   Cervical, supraclavicular, and axillary nodes normal  Neurologic:   CNII-XII intact. Normal strength, sensation and reflexes      throughout   Activities of Daily Living In your present state of health, do you have any difficulty performing the following activities: 05/09/2019  Hearing? N  Vision? Y  Comment Due to blurring in vision when focusing. Wears glasses daily.  Difficulty concentrating or making decisions? N  Walking or climbing stairs? N  Dressing or bathing? N  Doing errands, shopping? N  Preparing Food and eating ? N  Using the Toilet? N  In the past six months, have you accidently leaked urine? Y  Comment Rarely- does not wear protection.  Do you have problems with loss of bowel control? N  Managing your Medications? N  Managing your Finances? N  Housekeeping or managing your Housekeeping? N  Some recent data might be hidden    Fall Risk  Assessment Fall Risk  05/09/2019 05/02/2018  04/20/2017 04/17/2016  Falls in the past year? 1 Yes No No  Number falls in past yr: 1 1 - -  Injury with Fall? 0 No - -  Follow up Falls prevention discussed Falls prevention discussed - -     Depression Screen PHQ 2/9 Scores 05/09/2019 05/09/2019 05/02/2018 05/02/2018  PHQ - 2 Score 0 0 1 1  PHQ- 9 Score 5 - 6 -    6CIT Screen 05/02/2018  What Year? 0 points  What month? 0 points  What time? 0 points  Count back from 20 0 points  Months in reverse 2 points  Repeat phrase 4 points  Total Score 6       Assessment & Plan:    Annual Physical Reviewed patient's Family Medical History Reviewed and updated list of patient's medical providers Assessment of cognitive impairment was done Assessed patient's functional ability Established a written schedule for health screening [place] Completed and Reviewed  Exercise Activities and Dietary recommendations Goals    . DIET - INCREASE WATER INTAKE     Recommend increasing water intake to 4-6 glasses a day.     . Exercise 3x per week (30 min per time)     Recommend increasing exercise. Pt to work up to walking 3 days a week for 30 minutes.     . Prevent falls     Recommend to remove any items from the home that may cause slips or trips.       Immunization History  Administered Date(s) Administered  . Influenza, High Dose Seasonal PF 08/09/2015, 07/23/2016, 08/26/2018  . Pneumococcal Conjugate-13 12/15/2013  . Pneumococcal Polysaccharide-23 11/16/2006  . Td 05/29/2016    Health Maintenance  Topic Date Due  . OPHTHALMOLOGY EXAM  01/14/2018  . FOOT EXAM  04/27/2018  . URINE MICROALBUMIN  04/27/2018  . HEMOGLOBIN A1C  04/04/2019  . INFLUENZA VACCINE  06/17/2019  . COLONOSCOPY  05/24/2023  . TETANUS/TDAP  05/29/2026  . PNA vac Low Risk Adult  Completed     Discussed health benefits of physical activity, and encouraged him to engage in regular exercise appropriate for his age and condition.     ------------------------------------------------------------------------------------------------------------  1. Essential hypertension Well controlled.  Continue current medications.    2. Aortic atherosclerosis (HCC)  - EKG 12-Lead  3. Type 2 diabetes mellitus with neurological complications (HCC)  - Hemoglobin A1c - CBC  4. Hypertriglyceridemia Check labs. Counseled on recommendations for statins in diabetes.  - Comprehensive metabolic panel - Lipid panel - CBC  5. Paroxysmal atrial fibrillation (HCC) Normal exam and EKG today.  - EKG 12-Lead  6. Annual physical exam    Lelon Huh, MD  [facility]

## 2019-05-16 DIAGNOSIS — E781 Pure hyperglyceridemia: Secondary | ICD-10-CM | POA: Diagnosis not present

## 2019-05-16 DIAGNOSIS — E1149 Type 2 diabetes mellitus with other diabetic neurological complication: Secondary | ICD-10-CM | POA: Diagnosis not present

## 2019-05-17 ENCOUNTER — Telehealth: Payer: Self-pay

## 2019-05-17 DIAGNOSIS — K7460 Unspecified cirrhosis of liver: Secondary | ICD-10-CM

## 2019-05-17 DIAGNOSIS — R161 Splenomegaly, not elsewhere classified: Secondary | ICD-10-CM

## 2019-05-17 DIAGNOSIS — D61818 Other pancytopenia: Secondary | ICD-10-CM

## 2019-05-17 LAB — LIPID PANEL
Chol/HDL Ratio: 4.3 ratio (ref 0.0–5.0)
Cholesterol, Total: 113 mg/dL (ref 100–199)
HDL: 26 mg/dL — ABNORMAL LOW (ref >39)
LDL Calculated: 41 mg/dL (ref 0–99)
Triglycerides: 231 mg/dL — ABNORMAL HIGH (ref 0–149)
VLDL Cholesterol Cal: 46 mg/dL — ABNORMAL HIGH (ref 5–40)

## 2019-05-17 LAB — CBC
Hematocrit: 34.9 % — ABNORMAL LOW (ref 37.5–51.0)
Hemoglobin: 11.2 g/dL — ABNORMAL LOW (ref 13.0–17.7)
MCH: 25.8 pg — ABNORMAL LOW (ref 26.6–33.0)
MCHC: 32.1 g/dL (ref 31.5–35.7)
MCV: 80 fL (ref 79–97)
Platelets: 48 10*3/uL — CL (ref 150–450)
RBC: 4.34 x10E6/uL (ref 4.14–5.80)
RDW: 14.5 % (ref 11.6–15.4)
WBC: 2.3 10*3/uL — CL (ref 3.4–10.8)

## 2019-05-17 LAB — COMPREHENSIVE METABOLIC PANEL
ALT: 21 IU/L (ref 0–44)
AST: 42 IU/L — ABNORMAL HIGH (ref 0–40)
Albumin/Globulin Ratio: 2.3 — ABNORMAL HIGH (ref 1.2–2.2)
Albumin: 4.5 g/dL (ref 3.7–4.7)
Alkaline Phosphatase: 67 IU/L (ref 39–117)
BUN/Creatinine Ratio: 12 (ref 10–24)
BUN: 11 mg/dL (ref 8–27)
Bilirubin Total: 1.1 mg/dL (ref 0.0–1.2)
CO2: 25 mmol/L (ref 20–29)
Calcium: 8.8 mg/dL (ref 8.6–10.2)
Chloride: 99 mmol/L (ref 96–106)
Creatinine, Ser: 0.91 mg/dL (ref 0.76–1.27)
GFR calc Af Amer: 93 mL/min/{1.73_m2} (ref >59)
GFR calc non Af Amer: 80 mL/min/{1.73_m2} (ref >59)
Globulin, Total: 2 g/dL (ref 1.5–4.5)
Glucose: 159 mg/dL — ABNORMAL HIGH (ref 65–99)
Potassium: 3.7 mmol/L (ref 3.5–5.2)
Sodium: 140 mmol/L (ref 134–144)
Total Protein: 6.5 g/dL (ref 6.0–8.5)

## 2019-05-17 LAB — HEMOGLOBIN A1C
Est. average glucose Bld gHb Est-mCnc: 146 mg/dL
Hgb A1c MFr Bld: 6.7 % — ABNORMAL HIGH (ref 4.8–5.6)

## 2019-05-17 NOTE — Telephone Encounter (Signed)
-----   Message from Birdie Sons, MD sent at 05/17/2019 11:02 AM EDT ----- Patient has low blood cell counts. 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 Also has elevated liver functions needs. Abdominal ultrasound for pancytopenia, hepatomegaly and elevated transaminases. Also need to avoid alcohol and nsaids  Rest of labs are good.

## 2019-05-17 NOTE — Telephone Encounter (Signed)
Pt advised. He agreed to proceed with the [place]   Thanks,   -Mickel Baas

## 2019-05-25 DIAGNOSIS — C4491 Basal cell carcinoma of skin, unspecified: Secondary | ICD-10-CM | POA: Diagnosis not present

## 2019-05-25 DIAGNOSIS — C44311 Basal cell carcinoma of skin of nose: Secondary | ICD-10-CM | POA: Diagnosis not present

## 2019-05-26 ENCOUNTER — Ambulatory Visit
Admission: RE | Admit: 2019-05-26 | Discharge: 2019-05-26 | Disposition: A | Discharge status: Home / Self Care | Payer: PPO | Source: Ambulatory Visit | Attending: Family Medicine | Admitting: Family Medicine

## 2019-05-26 ENCOUNTER — Telehealth: Payer: Self-pay

## 2019-05-26 ENCOUNTER — Other Ambulatory Visit: Payer: Self-pay | Admitting: Family Medicine

## 2019-05-26 ENCOUNTER — Other Ambulatory Visit: Payer: Self-pay

## 2019-05-26 DIAGNOSIS — R161 Splenomegaly, not elsewhere classified: Secondary | ICD-10-CM | POA: Diagnosis not present

## 2019-05-26 DIAGNOSIS — K7460 Unspecified cirrhosis of liver: Secondary | ICD-10-CM | POA: Diagnosis not present

## 2019-05-26 DIAGNOSIS — D61818 Other pancytopenia: Secondary | ICD-10-CM

## 2019-05-26 DIAGNOSIS — D696 Thrombocytopenia, unspecified: Secondary | ICD-10-CM

## 2019-05-26 DIAGNOSIS — K760 Fatty (change of) liver, not elsewhere classified: Secondary | ICD-10-CM

## 2019-05-26 DIAGNOSIS — K769 Liver disease, unspecified: Secondary | ICD-10-CM

## 2019-05-26 DIAGNOSIS — R7989 Other specified abnormal findings of blood chemistry: Secondary | ICD-10-CM

## 2019-05-26 DIAGNOSIS — D72819 Decreased white blood cell count, unspecified: Secondary | ICD-10-CM

## 2019-05-26 NOTE — Telephone Encounter (Signed)
Pt advised.   Thanks,   -Brandii Lakey  

## 2019-05-26 NOTE — Telephone Encounter (Signed)
-----   Message from Birdie Sons, MD sent at 05/26/2019 12:07 PM EDT ----- US shows enlarged liver and spleen which is probably  causing his low platelet and white blood cell count. Need to see hematologist and GI to follow. Have entered referrals.

## 2019-06-05 ENCOUNTER — Encounter: Payer: Self-pay | Admitting: Internal Medicine

## 2019-06-05 ENCOUNTER — Inpatient Hospital Stay: Payer: PPO

## 2019-06-05 ENCOUNTER — Other Ambulatory Visit: Payer: Self-pay

## 2019-06-05 ENCOUNTER — Inpatient Hospital Stay: Payer: PPO | Attending: Internal Medicine | Admitting: Internal Medicine

## 2019-06-05 VITALS — BP 145/75 | HR 58 | Temp 98.7°F | Resp 16 | Ht 69.88 in | Wt 197.6 lb

## 2019-06-05 DIAGNOSIS — Z8042 Family history of malignant neoplasm of prostate: Secondary | ICD-10-CM | POA: Insufficient documentation

## 2019-06-05 DIAGNOSIS — K7460 Unspecified cirrhosis of liver: Secondary | ICD-10-CM | POA: Insufficient documentation

## 2019-06-05 DIAGNOSIS — Z8673 Personal history of transient ischemic attack (TIA), and cerebral infarction without residual deficits: Secondary | ICD-10-CM | POA: Diagnosis not present

## 2019-06-05 DIAGNOSIS — Z7901 Long term (current) use of anticoagulants: Secondary | ICD-10-CM | POA: Insufficient documentation

## 2019-06-05 DIAGNOSIS — Z85828 Personal history of other malignant neoplasm of skin: Secondary | ICD-10-CM | POA: Insufficient documentation

## 2019-06-05 DIAGNOSIS — I4891 Unspecified atrial fibrillation: Secondary | ICD-10-CM | POA: Diagnosis not present

## 2019-06-05 DIAGNOSIS — E039 Hypothyroidism, unspecified: Secondary | ICD-10-CM | POA: Insufficient documentation

## 2019-06-05 DIAGNOSIS — Z8051 Family history of malignant neoplasm of kidney: Secondary | ICD-10-CM | POA: Diagnosis not present

## 2019-06-05 DIAGNOSIS — Z79899 Other long term (current) drug therapy: Secondary | ICD-10-CM | POA: Diagnosis not present

## 2019-06-05 DIAGNOSIS — D696 Thrombocytopenia, unspecified: Secondary | ICD-10-CM

## 2019-06-05 DIAGNOSIS — Z806 Family history of leukemia: Secondary | ICD-10-CM | POA: Insufficient documentation

## 2019-06-05 DIAGNOSIS — Z87891 Personal history of nicotine dependence: Secondary | ICD-10-CM | POA: Insufficient documentation

## 2019-06-05 DIAGNOSIS — F1010 Alcohol abuse, uncomplicated: Secondary | ICD-10-CM | POA: Diagnosis not present

## 2019-06-05 DIAGNOSIS — I10 Essential (primary) hypertension: Secondary | ICD-10-CM | POA: Diagnosis not present

## 2019-06-05 DIAGNOSIS — C61 Malignant neoplasm of prostate: Secondary | ICD-10-CM | POA: Diagnosis not present

## 2019-06-05 LAB — CBC WITH DIFFERENTIAL/PLATELET
Abs Immature Granulocytes: 0 10*3/uL (ref 0.00–0.07)
Basophils Absolute: 0 10*3/uL (ref 0.0–0.1)
Basophils Relative: 0 %
Eosinophils Absolute: 0.1 10*3/uL (ref 0.0–0.5)
Eosinophils Relative: 4 %
HCT: 36.8 % — ABNORMAL LOW (ref 39.0–52.0)
Hemoglobin: 11.7 g/dL — ABNORMAL LOW (ref 13.0–17.0)
Immature Granulocytes: 0 %
Lymphocytes Relative: 16 %
Lymphs Abs: 0.5 10*3/uL — ABNORMAL LOW (ref 0.7–4.0)
MCH: 26.1 pg (ref 26.0–34.0)
MCHC: 31.8 g/dL (ref 30.0–36.0)
MCV: 82 fL (ref 80.0–100.0)
Monocytes Absolute: 0.3 10*3/uL (ref 0.1–1.0)
Monocytes Relative: 8 %
Neutro Abs: 2.1 10*3/uL (ref 1.7–7.7)
Neutrophils Relative %: 72 %
Platelets: 63 10*3/uL — ABNORMAL LOW (ref 150–400)
RBC: 4.49 MIL/uL (ref 4.22–5.81)
RDW: 13.9 % (ref 11.5–15.5)
WBC: 3 10*3/uL — ABNORMAL LOW (ref 4.0–10.5)
nRBC: 0 % (ref 0.0–0.2)

## 2019-06-05 LAB — VITAMIN B12: Vitamin B-12: 939 pg/mL — ABNORMAL HIGH (ref 180–914)

## 2019-06-05 LAB — LACTATE DEHYDROGENASE: LDH: 78 U/L — ABNORMAL LOW (ref 98–192)

## 2019-06-05 LAB — TECHNOLOGIST SMEAR REVIEW: Tech Review: DECREASED

## 2019-06-05 NOTE — Progress Notes (Signed)
[place] NOTE  Patient Care Team: Birdie Sons, MD as PCP - General (Family Medicine) Laneta Simmers as Physician Assistant (Urology) Ian Cowman, MD as Consulting Physician (Cardiology) Jannet Mantis, MD (Dermatology)  CHIEF COMPLAINTS/PURPOSE OF CONSULTATION: Thrombocytopenia   HEMATOLOGY HISTORY  Oncology History Overview Note  # THROMBOCYTOPENIA [platelets- ]; WBC- ; Hb-  # Prostate cancer [s/p Prostatectomy 1995]; elevation PSA-Lupron [Ian Sanders]  # Cirrhosis [5284; MRI; urology]; A.fib Xarelto [Dr. Paraschos]; colonoscopy 2019 Dr. Vicente Males   Prostate cancer [facility])  06/20/2015 Initial Diagnosis   Prostate cancer ([place])      HISTORY OF PRESENTING ILLNESS:  Ian Sanders 78 y.o.  male pleasant patient with history of alcohol use /cirrhosis /A. fib on Xarelto was been referred to [place] for further evaluation of thrombocytopenia.  Patient states that he was told to have cirrhosis the liver approximately 1 to 2 years ago which was incidentally noted on MRI of the abdomen/ordered by urology.  However more recently noted to have decline in his blood counts including white count/hemoglobin and platelets.  He admits to mild bruising.  Denies any gum bleeding or nosebleeds.  Patient admits to mild abdominal distention.  Admits to mild fatigue.  Chronic mild joint pains.    Review of Systems  Constitutional: Positive for malaise/fatigue. Negative for chills, diaphoresis, fever and weight loss.  HENT: Negative for nosebleeds and sore throat.   Eyes: Negative for double vision.  Respiratory: Negative for cough, hemoptysis, sputum production, shortness of breath and wheezing.   Cardiovascular: Negative for chest pain, palpitations, orthopnea and leg swelling.  Gastrointestinal: Negative for abdominal pain, blood in stool, constipation, diarrhea, heartburn, melena, nausea and vomiting.       Abdominal distension.   Genitourinary:  Negative for dysuria, frequency and urgency.  Musculoskeletal: Positive for back pain and joint pain.  Skin: Negative.  Negative for itching and rash.  Neurological: Negative for dizziness, tingling, focal weakness, weakness and headaches.  Endo/Heme/Allergies: Does not bruise/bleed easily.  Psychiatric/Behavioral: Negative for depression. The patient is not nervous/anxious and does not have insomnia.      MEDICAL HISTORY:  Past Medical History:  Diagnosis Date  . Atrial fibrillation ([place])   . Basal cell carcinoma   . Benign paroxysmal positional vertigo   . Cancer ([place])   . History of chicken pox   . History of measles   . History of mumps   . Hypertension   . Mononeuritis of unspecified site   . Other seborrheic keratosis   . Stroke ([place])   . 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 Unspecified hypothyroidism     SURGICAL HISTORY: Past Surgical History:  Procedure Laterality Date  . Carotid Doppler Ultrasound  04/02/2010   Small amount Calcified plaque bilaterally, no significant stenosis.  . COLONOSCOPY  05/11/2014   Tubular Adenoma. Dr. Allen Norris  . COLONOSCOPY WITH PROPOFOL N/A 05/23/2018   Procedure: COLONOSCOPY WITH PROPOFOL;  Surgeon: [name], MD;  Location: [facility] ENDOSCOPY;  Service: Gastroenterology;  Laterality: N/A;  . DOPPLER ECHOCARDIOGRAPHY  04/02/2010   Mild left atrial dilation. Normal right atrium. No valvular disease. No thrombus. Normal LV function. EF>55%  . FINGER AMPUTATION    . HERNIA REPAIR    . MRI Brain with and without contrast  03/20/2010   Suggestive of basal ganglia lacunar infarct  . PROSTATECTOMY  1995   Abdominal  . SKIN SURGERY     multiple  . Thumb surgery  SOCIAL HISTORY: Social History   Socioeconomic History  . Marital status: Married    Spouse name: Not on file  . Number of children: 2  . Years of education: Not on file  . Highest education level: Associate degree:  occupational, Hotel manager, or vocational program  Occupational History  . Occupation: Retired  Scientific laboratory technician  . Financial resource strain: Not hard at all  . Food insecurity    Worry: Never true    Inability: Never true  . Transportation needs    Medical: No    Non-medical: No  Tobacco Use  . Smoking status: Former Smoker    Packs/day: 0.00    Years: 12.00    Pack years: 0.00    Types: Cigarettes  . Smokeless tobacco: Current User    Types: Chew  . Tobacco comment: quit over 45 years ago  Substance and Sexual Activity  . Alcohol use: Yes    Alcohol/week: 5.0 standard drinks    Types: 5 Cans of beer per week  . Drug use: No  . Sexual activity: Not on file  Lifestyle  . Physical activity    Days per week: 0 days    Minutes per session: 0 min  . Stress: Not at all  Relationships  . Social Herbalist on phone: Patient refused    Gets together: Patient refused    Attends religious service: Patient refused    Active member of club or organization: Patient refused    Attends meetings of clubs or organizations: Patient refused    Relationship status: Patient refused  . Intimate partner violence    Fear of current or ex partner: Patient refused    Emotionally abused: Patient refused    Physically abused: Patient refused    Forced sexual activity: Patient refused  Other Topics Concern  . 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     FAMILY HISTORY: Family History  Problem Relation Age of Onset  . Heart disease Mother 106       CABG  . Hypertension Mother   . Heart attack Mother   . Hypertension Brother   . Heart disease Sister        stents  . Prostate cancer Brother   . Kidney cancer Brother   . Skin cancer Brother   . Hypertension Brother   . Bladder Cancer Sister   . Leukemia Father   . Stroke Father        multiple  . Cerebral aneurysm Sister   . Kidney cancer  Brother     ALLERGIES:  is allergic to lisinopril; myrbetriq [mirabegron]; atorvastatin; dabigatran etexilate mesylate; and dabigatran etexilate mesylate.  MEDICATIONS:  Current Outpatient Medications  Medication Sig Dispense Refill  . atenolol (TENORMIN) 50 MG tablet Take 1 tablet (50 mg total) by mouth daily. 30 tablet 0  . calcium-vitamin D (OSCAL) 250-125 MG-UNIT per tablet Take 1 tablet by mouth 2 (two) times daily.     . chlorthalidone (HYGROTON) 25 MG tablet Take 1 tablet (25 mg total) by mouth daily. (Patient taking differently: Take 25 mg by mouth every other day. ) 90 tablet 3  . Cyanocobalamin (VITAMIN B-12 PO) Take by mouth daily.    . cyclobenzaprine (FLEXERIL) 10 MG tablet Take 0.5 tablets (5 mg total) by mouth 2 (two) times daily as needed. 30 tablet 3  . Docusate Calcium (STOOL SOFTENER PO)  Take by mouth as needed.    Ian Sanders Kitchen EPINEPHrine (EPIPEN 2-PAK) 0.3 mg/0.3 mL IJ SOAJ injection Inject 0.3 mg into the muscle once.     . flecainide (TAMBOCOR) 50 MG tablet Take 1 tablet (50 mg total) by mouth 2 (two) times daily. 180 tablet 3  . gabapentin (NEURONTIN) 100 MG capsule Take 1 capsule (100 mg total) by mouth 3 (three) times daily. 270 capsule 3  . Leuprolide Acetate, 6 Month, (LUPRON) 45 MG injection Inject 45 mg into the muscle every 6 (six) months.    Ian Sanders Kitchen omeprazole (PRILOSEC) 40 MG capsule TAKE ONE CAPSULE DAILY. 90 capsule 4  . rivaroxaban (XARELTO) 20 MG TABS tablet Take 1 tablet (20 mg total) by mouth daily. 30 tablet 11  . traMADol (ULTRAM) 50 MG tablet Take 1 tablet (50 mg total) by mouth every 6 (six) hours as needed. 30 tablet 1  . zolpidem (AMBIEN) 10 MG tablet TAKE ONE-HALF TO ONE TABLET AT BEDTIME. 90 tablet 0  . BIOTIN PO Take by mouth daily.    . cephALEXin (KEFLEX) 500 MG capsule     . fesoterodine (TOVIAZ) 4 MG TB24 tablet Take 1 tablet (4 mg total) by mouth daily. (Patient not taking: Reported on 05/09/2019) 30 tablet 3   Current Facility-Administered Medications   Medication Dose Route Frequency Provider Last Rate Last Dose  . lidocaine (XYLOCAINE) 2 % jelly 1 application  1 application Urethral Once Stoioff, Scott C, MD         .  PHYSICAL EXAMINATION:   Vitals:   06/05/19 1400  BP: (!) 145/75  Pulse: (!) 58  Resp: 16  Temp: 98.7 F (37.1 C)   Filed Weights   06/05/19 1400  Weight: 197 lb 9.6 oz (89.6 kg)    Physical Exam  Constitutional: He is oriented to person, place, and time and well-developed, well-nourished, and in no distress.  HENT:  Head: Normocephalic and atraumatic.  Mouth/Throat: Oropharynx is clear and moist. No oropharyngeal exudate.  Eyes: Pupils are equal, round, and reactive to light.  Neck: Normal range of motion. Neck supple.  Cardiovascular: Normal rate and regular rhythm.  Pulmonary/Chest: No respiratory distress. He has no wheezes.  Abdominal: Soft. Bowel sounds are normal. He exhibits no distension and no mass. There is no abdominal tenderness. There is no rebound and no guarding.  Positive for splenomegaly.  Musculoskeletal: Normal range of motion.        General: No tenderness or edema.  Neurological: He is alert and oriented to person, place, and time.  Skin: Skin is warm.  Psychiatric: Affect normal.     LABORATORY DATA:  I have reviewed the data as listed Lab Results  Component Value Date   WBC 3.0 (L) 06/05/2019   HGB 11.7 (L) 06/05/2019   HCT 36.8 (L) 06/05/2019   MCV 82.0 06/05/2019   PLT 63 (L) 06/05/2019   Recent Labs    05/16/19 1020  NA 140  K 3.7  CL 99  CO2 25  GLUCOSE 159*  BUN 11  CREATININE 0.91  CALCIUM 8.8  GFRNONAA 80  GFRAA 93  PROT 6.5  ALBUMIN 4.5  AST 42*  ALT 21  ALKPHOS 67  BILITOT 1.1     US Abdomen Complete  Result Date: 05/26/2019 CLINICAL DATA:  Hepatic cirrhosis. EXAM: ABDOMEN ULTRASOUND COMPLETE COMPARISON:  CT scan of January 31, 2018. Ultrasound of April 23, 2016. MRI of May 17, 2018. FINDINGS: Gallbladder: Cholelithiasis is noted without  gallbladder wall thickening or pericholecystic fluid.  No sonographic Murphy's sign is noted. Common bile duct: Diameter: 7 mm which is within normal limits. Liver: No focal lesion identified. Mildly increased echogenicity of hepatic parenchyma is noted. Portal vein is patent on color Doppler imaging with normal direction of blood flow towards the liver. IVC: No abnormality visualized. Pancreas: Visualized portion unremarkable. Spleen: Measures 20.1 x 11.3 x 13.9 cm with calculated volume of 1649 cubic cm. This is consistent with moderate to severe splenomegaly. No focal abnormality is noted. Right Kidney: Length: 11.1 cm. Echogenicity within normal limits. No hydronephrosis visualized. 1.5 cm simple cyst is noted in midpole. 1.9 cm septated cyst is noted in midpole is well. Left Kidney: Length: 11.2 cm. Two simple cysts are noted, with the largest measuring 2.9 cm echogenicity within normal limits. No mass or hydronephrosis visualized. Abdominal aorta: No aneurysm visualized. Other findings: None. IMPRESSION: Cholelithiasis without inflammation. 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 Mildly increased echogenicity of hepatic parenchyma is noted suggesting diffuse hepatocellular disease. Bilateral simple renal cysts are noted. 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 Electronically Signed   By: Marijo Conception M.D.   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  # Discussed mechanism of of his pancytopenia/in relation to cirrhosis splenomegaly.  Unfortunately this cannot be reversed.  However monitor closely.  Would rule out other causes check peripheral smear/B12/LDH.   #A. fib on Xarelto-again needs to be closely monitored given increased risk of bleeding platelets dropping less than 40-  50,000.  #Cirrhosis-question alcohol versus others.  Recommend evaluation with GI; discussed that he will need upper endoscopies/other work-up for cirrhosis.  Also discussed the risk of HCC/surveillance.  #Alcohol abuse.-Recommend alcohol cessation.  Especially context of his cirrhosis thrombocytopenia.  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   #I offered to talk to his wife he declined. # Labs- today # referral to Dr.Anna re: Cirrhosis # follow up in 3 months/labs- cbc/cmp- Dr.B  Thank you Dr. Caryn Section for allowing me to participate in the care of your pleasant patient. Please do not hesitate to contact me with questions or concerns in the interim.  Cc: Dr.Fisher-     All questions were answered. The patient knows to call the clinic with any problems, questions or concerns.  Thank you Dr. for allowing me to participate in the care of your pleasant patient. Please do not hesitate to contact me with questions or concerns in the interim.   Cammie Sickle, MD 06/05/2019 5:25 PM   # 45 minutes face-to-face with the patient discussing the above plan of care; more than 50% of time spent on counseling and coordination.

## 2019-06-05 NOTE — Progress Notes (Signed)
Referred for evaluation of abnormal labs with an enlarged spleen.

## 2019-06-05 NOTE — Assessment & Plan Note (Addendum)
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  # Discussed mechanism of of his pancytopenia/in relation to cirrhosis splenomegaly.  Unfortunately this cannot be reversed.  However monitor closely.  Would rule out other causes check peripheral smear/B12/LDH.   #A. fib on Xarelto-again needs to be closely monitored given increased risk of bleeding platelets dropping less than 40- 50,000.  #Cirrhosis-question alcohol versus others.  Recommend evaluation with GI; discussed that he will need upper endoscopies/other work-up for cirrhosis.  Also discussed the risk of HCC/surveillance.  #Alcohol abuse.-Recommend alcohol cessation.  Especially context of his cirrhosis thrombocytopenia.  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   #I offered to talk to his wife he declined. # Labs- today # referral to Dr.Anna re: Cirrhosis # follow up in 3 months/labs- cbc/cmp- Dr.B  Thank you Dr. Caryn Section for allowing me to participate in the care of your pleasant patient. Please do not hesitate to contact me with questions or concerns in the interim.  Cc: Dr.Fisher-

## 2019-06-06 LAB — AFP TUMOR MARKER: AFP, Serum, Tumor Marker: 2.5 ng/mL (ref 0.0–8.3)

## 2019-06-08 ENCOUNTER — Other Ambulatory Visit: Payer: Self-pay | Admitting: Family Medicine

## 2019-06-26 DIAGNOSIS — H2513 Age-related nuclear cataract, bilateral: Secondary | ICD-10-CM | POA: Diagnosis not present

## 2019-06-26 LAB — HM DIABETES EYE EXAM

## 2019-07-13 ENCOUNTER — Encounter (INDEPENDENT_AMBULATORY_CARE_PROVIDER_SITE_OTHER): Payer: Self-pay

## 2019-07-13 ENCOUNTER — Ambulatory Visit (INDEPENDENT_AMBULATORY_CARE_PROVIDER_SITE_OTHER): Payer: PPO | Admitting: Gastroenterology

## 2019-07-13 ENCOUNTER — Other Ambulatory Visit: Payer: Self-pay

## 2019-07-13 VITALS — BP 164/80 | HR 75 | Temp 98.7°F | Ht 69.0 in | Wt 199.2 lb

## 2019-07-13 DIAGNOSIS — K7290 Hepatic failure, unspecified without coma: Secondary | ICD-10-CM

## 2019-07-13 DIAGNOSIS — K7682 Hepatic encephalopathy: Secondary | ICD-10-CM

## 2019-07-13 DIAGNOSIS — K7460 Unspecified cirrhosis of liver: Secondary | ICD-10-CM | POA: Diagnosis not present

## 2019-07-13 MED ORDER — LACTULOSE 10 GM/15ML PO SOLN: 10.0000 g | Freq: Two times a day (BID) | ORAL | 2 refills | Status: DC

## 2019-07-13 NOTE — Progress Notes (Signed)
Ian Bellows MD, MRCP(U.K) [address]  [place]  [place], [place] [address]  Main: [phone]  Fax: [phone]   Gastroenterology Consultation  Referring Provider:     Cammie Sickle, * Primary Care Physician:  Birdie Sons, MD Primary Gastroenterologist:  Dr. Jonathon Sanders  Reason for Consultation:     Cirrhosis of the liver        HPI:   Ian Sanders is a 78 y.o. y/o male referred for consultation & management  by Dr. Lynett Fish for cirrhosis of the liver.  Has a history of cirrhosis, alcohol use and has been on Xarelto for A. fib.  Recently seen by Dr. Rogue Bussing for thrombocytopenia.  Thrombocytopenia has been attributed to liver cirrhosis.  He has a history of prostate cancer.  05/17/2018 MRI of the abdomen shows hepatic cirrhosis.  Moderate splenomegaly.  Cholelithiasis. 05/26/2019: Ultrasound of the abdomen complete: Moderate to severe splenomegaly, cholelithiasis, no ascites.  Labs 06/05/2019: Hemoglobin 11.7, MCV 82, platelet count 63.  B12 939. 05/16/2019 CMP: Albumin 4.5 AST 42 ALT 21 alkaline phosphatase 67.  He states that he is known to have had fatty liver disease but never told he had cirrhosis of the liver.  He states that he has been drinking alcohol almost daily for many years.  He has served in [employer] and spent time at [place].  He has had a tattoo during his service.  He has also had a blood transfusion in 1995.  He absolutely denies any illegal drug use either intravenous or snorting of cocaine.  He states that at times he feels constipated and he has also had issues with memory and confusion.  Past Medical History:  Diagnosis Date  . Atrial fibrillation ([place])   . Basal cell carcinoma   . Benign paroxysmal positional vertigo   . Cancer ([place])   . History of chicken pox   . History of measles   . History of mumps   . Hypertension   . Mononeuritis of unspecified site   . Other seborrheic keratosis   . Stroke ([place])   . Transient  ischemic attack (TIA), and cerebral infarction without residual deficits(V12.54)   . Unspecified hypothyroidism     Past Surgical History:  Procedure Laterality Date  . Carotid Doppler Ultrasound  04/02/2010   Small amount Calcified plaque bilaterally, no significant stenosis.  . COLONOSCOPY  05/11/2014   Tubular Adenoma. Dr. Allen Norris  . COLONOSCOPY WITH PROPOFOL N/A 05/23/2018   Procedure: COLONOSCOPY WITH PROPOFOL;  Surgeon: Ian Bellows, MD;  Location: [facility] ENDOSCOPY;  Service: Gastroenterology;  Laterality: N/A;  . DOPPLER ECHOCARDIOGRAPHY  04/02/2010   Mild left atrial dilation. Normal right atrium. No valvular disease. No thrombus. Normal LV function. EF>55%  . FINGER AMPUTATION    . HERNIA REPAIR    . MRI Brain with and without contrast  03/20/2010   Suggestive of basal ganglia lacunar infarct  . PROSTATECTOMY  1995   Abdominal  . SKIN SURGERY     multiple  . Thumb surgery      Prior to Admission medications   Medication Sig Start Date End Date Taking? Authorizing Provider  atenolol (TENORMIN) 50 MG tablet Take 1 tablet (50 mg total) by mouth daily. 08/03/18   Minna Merritts, MD  BIOTIN PO Take by mouth daily.    [provider]  calcium-vitamin D (OSCAL) 250-125 MG-UNIT per tablet Take 1 tablet by mouth 2 (two) times daily.     [provider]  cephALEXin (  KEFLEX) 500 MG capsule  05/25/19   [provider]  chlorthalidone (HYGROTON) 25 MG tablet Take 1 tablet (25 mg total) by mouth daily. Patient taking differently: Take 25 mg by mouth every other day.  07/15/17   Minna Merritts, MD  Cyanocobalamin (VITAMIN B-12 PO) Take by mouth daily.    [provider]  cyclobenzaprine (FLEXERIL) 10 MG tablet Take 0.5 tablets (5 mg total) by mouth 2 (two) times daily as needed. 01/17/18   Birdie Sons, MD  Docusate Calcium (STOOL SOFTENER PO) Take by mouth as needed.    [provider]  EPINEPHrine (EPIPEN 2-PAK) 0.3 mg/0.3 mL IJ SOAJ injection  Inject 0.3 mg into the muscle once.  02/12/15   [provider]  fesoterodine (TOVIAZ) 4 MG TB24 tablet Take 1 tablet (4 mg total) by mouth daily. Patient not taking: Reported on 05/09/2019 01/24/19   Zara Council A, PA-C  flecainide (TAMBOCOR) 50 MG tablet Take 1 tablet (50 mg total) by mouth 2 (two) times daily. 07/15/17   Minna Merritts, MD  gabapentin (NEURONTIN) 100 MG capsule Take 1 capsule (100 mg total) by mouth 3 (three) times daily. 10/04/18   Birdie Sons, MD  Leuprolide Acetate, 6 Month, (LUPRON) 45 MG injection Inject 45 mg into the muscle every 6 (six) months.    [provider]  omeprazole (PRILOSEC) 40 MG capsule TAKE ONE CAPSULE DAILY. 06/06/18   Birdie Sons, MD  rivaroxaban (XARELTO) 20 MG TABS tablet Take 1 tablet (20 mg total) by mouth daily. 08/21/16   Minna Merritts, MD  traMADol (ULTRAM) 50 MG tablet Take 1 tablet (50 mg total) by mouth every 6 (six) hours as needed. 12/14/18   Birdie Sons, MD  zolpidem (AMBIEN) 10 MG tablet TAKE ONE-HALF TO ONE TABLET AT BEDTIME. 06/08/19   Birdie Sons, MD    Family History  Problem Relation Age of Onset  . Heart disease Mother 54       CABG  . Hypertension Mother   . Heart attack Mother   . Hypertension Brother   . Heart disease Sister        stents  . Prostate cancer Brother   . Kidney cancer Brother   . Skin cancer Brother   . Hypertension Brother   . Bladder Cancer Sister   . Leukemia Father   . Stroke Father        multiple  . Cerebral aneurysm Sister   . Kidney cancer Brother      Social History   Tobacco Use  . Smoking status: Former Smoker    Packs/day: 0.00    Years: 12.00    Pack years: 0.00    Types: Cigarettes  . Smokeless tobacco: Current User    Types: Chew  . Tobacco comment: quit over 45 years ago  Substance Use Topics  . Alcohol use: Yes    Alcohol/week: 5.0 standard drinks    Types: 5 Cans of beer per week  . Drug use: No    Allergies as of 07/13/2019 -  Review Complete 07/13/2019  Allergen Reaction Noted  . Lisinopril Swelling 02/12/2015  . Myrbetriq [mirabegron] Swelling 12/26/2015  . Atorvastatin Other (See Comments) 06/19/2015  . Dabigatran etexilate mesylate  12/14/2011  . Dabigatran etexilate mesylate Other (See Comments) 12/14/2011    Review of Systems:    All systems reviewed and negative except where noted in HPI.   Physical Exam:  BP (!) 164/80   Pulse 75  Temp 98.7 F (37.1 C)   Ht 5\' 9"  (1.753 m)   Wt 199 lb 3.2 oz (90.4 kg)   BMI 29.42 kg/m  No LMP for male patient. Psych:  Alert and cooperative. Normal mood and affect. General:   Alert,  Well-developed, well-nourished, pleasant and cooperative in NAD Head:  Normocephalic and atraumatic. Eyes:  Sclera clear, no icterus.   Conjunctiva pink. Ears:  Normal auditory acuity. Nose:  No deformity, discharge, or lesions. Mouth:  No deformity or lesions,oropharynx pink & moist. Neck:  Supple; no masses or thyromegaly. Lungs:  Respirations even and unlabored.  Clear throughout to auscultation.   No wheezes, crackles, or rhonchi. No acute distress. Heart:  Regular rate and rhythm; no murmurs, clicks, rubs, or gallops. Abdomen:  Normal bowel sounds.  No bruits.  Soft, non-tender and non-distended without masses, hepatosplenomegaly or hernias noted.  No guarding or rebound tenderness.    Neurologic:  Alert and oriented x3;  grossly normal neurologically. Skin:  Intact without significant lesions or rashes. No jaundice. Lymph Nodes:  No significant cervical adenopathy. Psych:  Alert and cooperative. Normal mood and affect.  Imaging Studies: No results found.  Assessment and Plan:   Ian Sanders is a 78 y.o. y/o male has been referred for cirrhosis of the liver.  Likely cause of cirrhosis is secondary to alcohol.  May also have coexisting alcoholic fatty liver disease.  In view of prior tattoos I will plan to rule out viral hepatitis.  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  Ordered labs to rule out viral hepatitis and autoimmune liver disease in addition will rule out congenital enzyme disorders. 2.  Right upper quadrant ultrasound every 6 monthly to screen for [facility]. 3.  EGD to screen for esophageal varices. 4.  Stop alcohol consumption 5.  Check CMP and INR to calculate meld score. 6.  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  I have discussed alternative options, risks & benefits,  which include, but are not limited to, bleeding, infection, perforation,respiratory complication & drug reaction.  The patient agrees with this plan & written consent will be obtained.     Follow up in 3 months  Dr Ian Bellows MD,MRCP(U.K)

## 2019-07-18 LAB — MITOCHONDRIAL/SMOOTH MUSCLE AB PNL
Mitochondrial Ab: <20 Units (ref 0.0–20.0)
Smooth Muscle Ab: 14 Units (ref 0–19)

## 2019-07-18 LAB — IMMUNOGLOBULINS A/E/G/M, SERUM
IgA/Immunoglobulin A, Serum: 293 mg/dL (ref 61–437)
IgE (Immunoglobulin E), Serum: 89 IU/mL (ref 6–495)
IgG (Immunoglobin G), Serum: 972 mg/dL (ref 603–1613)
IgM (Immunoglobulin M), Srm: 148 mg/dL — ABNORMAL HIGH (ref 15–143)

## 2019-07-18 LAB — IRON,TIBC AND FERRITIN PANEL
Ferritin: 25 ng/mL — ABNORMAL LOW (ref 30–400)
Iron Saturation: 13 % — ABNORMAL LOW (ref 15–55)
Iron: 60 ug/dL (ref 38–169)
Total Iron Binding Capacity: 446 ug/dL (ref 250–450)
UIBC: 386 ug/dL — ABNORMAL HIGH (ref 111–343)

## 2019-07-18 LAB — CELIAC DISEASE AB SCREEN W/RFX
Antigliadin Abs, IgA: 4 units (ref 0–19)
Transglutaminase IgA: <2 U/mL (ref 0–3)

## 2019-07-18 LAB — ANTI-MICROSOMAL ANTIBODY LIVER / KIDNEY: LKM1 Ab: 1.3 Units (ref 0.0–20.0)

## 2019-07-18 LAB — HEPATITIS B SURFACE ANTIGEN: Hepatitis B Surface Ag: NEGATIVE

## 2019-07-18 LAB — HEPATITIS B E ANTIBODY

## 2019-07-18 LAB — HEPATITIS B E ANTIGEN: Hep B E Ag: NEGATIVE

## 2019-07-18 LAB — HEPATITIS B CORE ANTIBODY, TOTAL: Hep B Core Total Ab: NEGATIVE

## 2019-07-18 LAB — HEPATITIS C ANTIBODY: Hep C Virus Ab: 0.1 s/co ratio (ref 0.0–0.9)

## 2019-07-18 LAB — ALPHA-1-ANTITRYPSIN: A-1 Antitrypsin: 145 mg/dL (ref 101–187)

## 2019-07-18 LAB — HIV ANTIBODY (ROUTINE TESTING W REFLEX): HIV Screen 4th Generation wRfx: NONREACTIVE

## 2019-07-18 LAB — ANA: Anti Nuclear Antibody (ANA): POSITIVE — AB

## 2019-07-18 LAB — HEPATITIS A ANTIBODY, TOTAL: hep A Total Ab: NEGATIVE

## 2019-07-18 LAB — HEPATITIS B SURFACE ANTIBODY,QUALITATIVE: Hep B Surface Ab, Qual: NONREACTIVE

## 2019-07-18 LAB — CERULOPLASMIN: Ceruloplasmin: 30 mg/dL (ref 16.0–31.0)

## 2019-07-21 ENCOUNTER — Other Ambulatory Visit: Payer: Self-pay | Admitting: Family Medicine

## 2019-07-21 DIAGNOSIS — C61 Malignant neoplasm of prostate: Secondary | ICD-10-CM

## 2019-07-25 ENCOUNTER — Other Ambulatory Visit: Payer: PPO

## 2019-07-25 ENCOUNTER — Other Ambulatory Visit: Payer: Self-pay

## 2019-07-25 DIAGNOSIS — C61 Malignant neoplasm of prostate: Secondary | ICD-10-CM | POA: Diagnosis not present

## 2019-07-26 LAB — PSA: Prostate Specific Ag, Serum: <0.1 ng/mL (ref 0.0–4.0)

## 2019-07-26 NOTE — Progress Notes (Signed)
01/24/2019 4:28 PM   Ian Sanders [birth year] [id number]  Referring provider: Birdie Sons, [place],  [place] [address]  Chief Complaint  Patient presents with  . Hematuria    HPI: Ian Sanders is a 78 y.o. male with prostate cancer, incontinence and a history of hematuria who presents today for follow up.    History of hematuria CTU completed on 01/31/2018 noted a 1.2 cm partially exophytic lesion within the inferior pole of the right kidney with suggestion of mild contrast enhancement, raising the possibility of solid renal neoplasm.  There is a 9 mm lesion within the medial superior left kidney with suggestion of possible associated enhancement.  Recommend further evaluation of these lesions with MRI or short-term follow-up pre and post contrast-enhanced CT.  No nephroureterolithiasis. No hydronephrosis. No abnormal filling defects within the opacified renal collecting systems and ureters.  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  Prominent vascularity and stranding lateral to the ascending colon favored to be sequelae of portal venous hypertension.  Recommend clinical correlation for signs of colitis.  Cholelithiasis.  Bilateral femoral head AVN.  There is a 5 mm right lung nodule. No follow-up needed if patient is low-risk. Non-contrast chest CT can be considered in 12 months if patient is high-risk.  Cystoscopy on 02/09/2018 with Dr. Bernardo Heater was negative.  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 No suspicious renal masses.  He denies any gross hematuria.  His UA is negative.    Prostate cancer: Patient underwent RRP by Dr. Domingo Cocking at [facility] in 1995. His PSA remained below 1 until 2014 when it rose to 9.8 ng/mL. He was started on ADT with [place] in 03/15. He had received Firmagon for 4 months and then was switched to a longer  acting agent, Lupron.  His last PSA was <0.1 ng/mL on 07/2019.    Incontinence: Experienced tongue swelling with Myrbetriq.  Experienced dry eyes with Toviaz.  He reports on 01/24/2019 that it is infrequent enough that he does not need a pad, and happens when he sneezes or the like, and is open to trying a drug vacation from the [place] to see if his symptoms are worse without medication.  His IPSS is 13/2.  Patient denies dysuria.  His PVR is 0 mL.   IPSS    Row Name 07/27/19 1300         International Prostate Symptom Score   How often have you had the sensation of not emptying your bladder?  Less than 1 in 5     How often have you had to urinate less than every two hours?  Less than 1 in 5 times     How often have you found you stopped and started again several times when you urinated?  Less than half the time     How often have you found it difficult to postpone urination?  About half the time     How often have you had a weak urinary stream?  Not at All     How often have you had to strain to start urination?  Almost always     How many times did you typically get up at night to urinate?  1 Time     Total IPSS Score  13       Quality of Life due to urinary symptoms  If you were to spend the rest of your life with your urinary condition just the way it is now how would you feel about that?  Mostly Satisfied        PMH: Past Medical History:  Diagnosis Date  . Atrial fibrillation ([place])   . Basal cell carcinoma   . Benign paroxysmal positional vertigo   . Cancer ([place])   . History of chicken pox   . History of measles   . History of mumps   . Hypertension   . Mononeuritis of unspecified site   . Other seborrheic keratosis   . Stroke ([place])   . 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 Unspecified hypothyroidism     Surgical History: Past Surgical History:  Procedure Laterality Date  . Carotid Doppler Ultrasound   04/02/2010   Small amount Calcified plaque bilaterally, no significant stenosis.  . COLONOSCOPY  05/11/2014   Tubular Adenoma. Dr. Allen Norris  . COLONOSCOPY WITH PROPOFOL N/A 05/23/2018   Procedure: COLONOSCOPY WITH PROPOFOL;  Surgeon: [name], MD;  Location: [facility] ENDOSCOPY;  Service: Gastroenterology;  Laterality: N/A;  . DOPPLER ECHOCARDIOGRAPHY  04/02/2010   Mild left atrial dilation. Normal right atrium. No valvular disease. No thrombus. Normal LV function. EF>55%  . FINGER AMPUTATION    . HERNIA REPAIR    . MRI Brain with and without contrast  03/20/2010   Suggestive of basal ganglia lacunar infarct  . PROSTATECTOMY  1995   Abdominal  . SKIN SURGERY     multiple  . Thumb surgery      Home Medications:  Allergies as of 07/27/2019      Reactions   Lisinopril Swelling   Tongue swelling Tongue swelling  Tongue swelling Tongue swelling  Tongue swelling    Myrbetriq [mirabegron] Swelling   Of the tongue   Atorvastatin Other (See Comments)   Dizziness and Fatigue Dizziness and Fatigue   Dabigatran Etexilate Mesylate    Stomach ulcers (Pradaxa)   Dabigatran Etexilate Mesylate Other (See Comments)   Stomach ulcers (Pradaxa)      Medication List       Accurate as of July 27, 2019 11:59 PM. If you have any questions, ask your nurse or doctor.        STOP taking these medications   cephALEXin 500 MG capsule Commonly known as: KEFLEX Stopped by: Ebubechukwu Jedlicka, PA-C     TAKE these medications   atenolol 50 MG tablet Commonly known as: TENORMIN Take 1 tablet (50 mg total) by mouth daily.   BIOTIN PO Take by mouth daily.   calcium-vitamin D 250-125 MG-UNIT tablet Commonly known as: OSCAL Take 1 tablet by mouth 2 (two) times daily.   chlorthalidone 25 MG tablet Commonly known as: HYGROTON Take 1 tablet (25 mg total) by mouth daily. What changed: when to take this   cyclobenzaprine 10 MG tablet Commonly known as: FLEXERIL Take 0.5 tablets (5 mg total) by  mouth 2 (two) times daily as needed.   EpiPen 2-Pak 0.3 mg/0.3 mL Soaj injection Generic drug: EPINEPHrine Inject 0.3 mg into the muscle once.   ferrous sulfate 325 (65 FE) MG tablet Commonly known as: FerrouSul Take 1 tablet (325 mg total) by mouth every other day. Started by: Rushie Chestnut, CMA   fesoterodine 4 MG Tb24 tablet Commonly known as: TOVIAZ Take 1 tablet (4 mg total) by mouth daily.   flecainide 50 MG tablet Commonly known as: TAMBOCOR Take 1 tablet (50 mg total)  by mouth 2 (two) times daily.   gabapentin 100 MG capsule Commonly known as: NEURONTIN Take 1 capsule (100 mg total) by mouth 3 (three) times daily.   lactulose 10 GM/15ML solution Commonly known as: CHRONULAC Take 15 mLs (10 g total) by mouth 2 (two) times daily.   Leuprolide Acetate (6 Month) 45 MG injection Commonly known as: LUPRON Inject 45 mg into the muscle every 6 (six) months.   omeprazole 40 MG capsule Commonly known as: PRILOSEC TAKE ONE CAPSULE DAILY.   rivaroxaban 20 MG Tabs tablet Commonly known as: XARELTO Take 1 tablet (20 mg total) by mouth daily.   STOOL SOFTENER PO Take by mouth as needed.   traMADol 50 MG tablet Commonly known as: ULTRAM Take 1 tablet (50 mg total) by mouth every 6 (six) hours as needed.   VITAMIN B-12 PO Take by mouth daily.   zolpidem 10 MG tablet Commonly known as: AMBIEN TAKE ONE-HALF TO ONE TABLET AT BEDTIME.       Allergies:  Allergies  Allergen Reactions  . Lisinopril Swelling    Tongue swelling Tongue swelling  Tongue swelling Tongue swelling  Tongue swelling   . Myrbetriq [Mirabegron] Swelling    Of the tongue  . Atorvastatin Other (See Comments)    Dizziness and Fatigue  Dizziness and Fatigue  . Dabigatran Etexilate Mesylate     Stomach ulcers (Pradaxa)  . Dabigatran Etexilate Mesylate Other (See Comments)    Stomach ulcers (Pradaxa)    Family History: Family History  Problem Relation Age of Onset  . Heart disease  Mother 61       CABG  . Hypertension Mother   . Heart attack Mother   . Hypertension Brother   . Heart disease Sister        stents  . Prostate cancer Brother   . Kidney cancer Brother   . Skin cancer Brother   . Hypertension Brother   . Bladder Cancer Sister   . Leukemia Father   . Stroke Father        multiple  . Cerebral aneurysm Sister   . Kidney cancer Brother     Social History:  reports that he has quit smoking. His smoking use included cigarettes. He smoked 0.00 packs per day for 12.00 years. His smokeless tobacco use includes chew. He reports current alcohol use of about 5.0 standard drinks of alcohol per week. He reports that he does not use drugs.  ROS: UROLOGY Frequent Urination?: No Hard to postpone urination?: No Burning/pain with urination?: No Get up at night to urinate?: No Leakage of urine?: Yes Urine stream starts and stops?: No Trouble starting stream?: No Do you have to strain to urinate?: No Blood in urine?: No Urinary tract infection?: No Sexually transmitted disease?: No Injury to kidneys or bladder?: No Painful intercourse?: No Weak stream?: Yes Erection problems?: No Penile pain?: No  Gastrointestinal Nausea?: No Vomiting?: No Indigestion/heartburn?: No Diarrhea?: No Constipation?: No  Constitutional Fever: No Night sweats?: No Weight loss?: No Fatigue?: No  Skin Skin rash/lesions?: No Itching?: No  Eyes Blurred vision?: No Double vision?: No  Ears/Nose/Throat Sore throat?: No Sinus problems?: No  Hematologic/Lymphatic Swollen glands?: No Easy bruising?: No  Cardiovascular Leg swelling?: Yes Chest pain?: No  Respiratory Cough?: No Shortness of breath?: No  Endocrine Excessive thirst?: No  Musculoskeletal Back pain?: No Joint pain?: No  Neurological Headaches?: No Dizziness?: No  Psychologic Depression?: No Anxiety?: No  Physical Exam: BP (!) 152/74 (BP Location: Left Arm,  Patient Position: Sitting,  Cuff Size: Normal)   Pulse 73   Ht 5\' 9"  (1.753 m)   Wt 199 lb 1.6 oz (90.3 kg)   BMI 29.40 kg/m   Constitutional:  Well nourished. Alert and oriented, No acute distress. HEENT: [place] AT, moist mucus membranes.  Trachea midline, no masses. Cardiovascular: No clubbing, cyanosis, or edema. Respiratory: Normal respiratory effort, no increased work of breathing. Neurologic: Grossly intact, no focal deficits, moving all 4 extremities. Psychiatric: Normal mood and affect.  Laboratory Data: Lab Results  Component Value Date   WBC 3.0 (L) 06/05/2019   HGB 11.7 (L) 06/05/2019   HCT 36.8 (L) 06/05/2019   MCV 82.0 06/05/2019   PLT 63 (L) 06/05/2019    Lab Results  Component Value Date   CREATININE 0.91 05/16/2019   Lab Results  Component Value Date   HGBA1C 6.7 (H) 05/16/2019   Lab Results  Component Value Date   AST 42 (H) 05/16/2019   Lab Results  Component Value Date   ALT 21 05/16/2019   Urinalysis Component     Latest Ref Rng & Units 07/27/2019  Specific Gravity, UA     1.005 - 1.030 1.025  pH, UA     5.0 - 7.5 5.0  Color, UA     Yellow Yellow  Appearance Ur     Clear Clear  Leukocytes,UA     Negative Negative  Protein,UA     Negative/Trace Negative  Glucose, UA     Negative Negative  Ketones, UA     Negative Negative  RBC, UA     Negative Negative  Bilirubin, UA     Negative Negative  Urobilinogen, Ur     0.2 - 1.0 mg/dL 0.2  Nitrite, UA     Negative Negative  Microscopic Examination      See below:    Component     Latest Ref Rng & Units 07/27/2019  WBC, UA     0 - 5 /hpf 0-5  RBC     0 - 2 /hpf None seen  Epithelial Cells (non renal)     0 - 10 /hpf 0-10  Casts     None seen /lpf Present (A)  Cast Type     N/A Hyaline casts  Bacteria, UA     None seen/Few None seen   I have reviewed the labs.  Assessment & Plan:    1. History of hematuria - Hematuria work-up in June 2019 was negative - UA is negative  - No report of gross  hematuria  2. History of prostate cancer - PSA is undetectable - Lupron and PSA q 4 months   3. Incontinence - managing conservatively  Return in about 4 months (around 11/26/2019) for 4 month Lupron and PSA .  Zara Council, PA-C  [facility] [address] [place], [place] [address] [phone]

## 2019-07-27 ENCOUNTER — Telehealth: Payer: Self-pay

## 2019-07-27 ENCOUNTER — Other Ambulatory Visit: Payer: Self-pay

## 2019-07-27 ENCOUNTER — Ambulatory Visit: Payer: PPO | Admitting: Urology

## 2019-07-27 ENCOUNTER — Encounter: Payer: Self-pay | Admitting: Urology

## 2019-07-27 VITALS — BP 152/74 | HR 73 | Ht 69.0 in | Wt 199.1 lb

## 2019-07-27 DIAGNOSIS — E781 Pure hyperglyceridemia: Secondary | ICD-10-CM | POA: Diagnosis not present

## 2019-07-27 DIAGNOSIS — Z87448 Personal history of other diseases of urinary system: Secondary | ICD-10-CM | POA: Diagnosis not present

## 2019-07-27 DIAGNOSIS — I10 Essential (primary) hypertension: Secondary | ICD-10-CM | POA: Diagnosis not present

## 2019-07-27 DIAGNOSIS — N3941 Urge incontinence: Secondary | ICD-10-CM | POA: Diagnosis not present

## 2019-07-27 DIAGNOSIS — C61 Malignant neoplasm of prostate: Secondary | ICD-10-CM

## 2019-07-27 DIAGNOSIS — Z01810 Encounter for preprocedural cardiovascular examination: Secondary | ICD-10-CM | POA: Diagnosis not present

## 2019-07-27 DIAGNOSIS — I700 Atherosclerosis of aorta: Secondary | ICD-10-CM | POA: Diagnosis not present

## 2019-07-27 DIAGNOSIS — I480 Paroxysmal atrial fibrillation: Secondary | ICD-10-CM | POA: Diagnosis not present

## 2019-07-27 DIAGNOSIS — Z8673 Personal history of transient ischemic attack (TIA), and cerebral infarction without residual deficits: Secondary | ICD-10-CM | POA: Diagnosis not present

## 2019-07-27 LAB — BLADDER SCAN AMB NON-IMAGING: Scan Result: 0

## 2019-07-27 MED ORDER — LEUPROLIDE ACETATE (4 MONTH) 30 MG IM KIT
30.0000 mg | PACK | Freq: Once | INTRAMUSCULAR | Status: AC
  Administered 2019-07-27: 30 mg via INTRAMUSCULAR

## 2019-07-27 MED ORDER — FERROUS SULFATE 325 (65 FE) MG PO TABS: 325.0000 mg | ORAL_TABLET | ORAL | 0 refills | Status: DC

## 2019-07-27 NOTE — Telephone Encounter (Signed)
-----   Message from [name], MD sent at 07/17/2019  9:52 AM EDT ----- Ian Sanders  Please inform patient that he needs a vaccine for hepatitis A and B which he can come to our office and have it provided at any time.  He also has a low iron level and I would suggest to commence on ferrous sulfate 325 mg every other day.  I would also suggest that we proceed with upper endoscopy to be to screen for esophageal varices and any evidence of blood loss contributing to the anemia.  C/c Birdie Sons, MD   Dr [name] MD,MRCP [facility]) Gastroenterology/Hepatology Pager: [phone]

## 2019-07-27 NOTE — Telephone Encounter (Signed)
Spoke with Ian Sanders and informed him of lab results and Dr. Georgeann Oppenheim suggestions. Ian Sanders declined the offer of Hep A/B vaccine. Ian Sanders agrees to commence on oral iron (prescription sent to pharmacy). Ian Sanders endoscopy procedure is pending, as we are currently waiting for cardiac clearance and holding instructions for the Xarelto. Ian Sanders states he has an appointment with his cardiologist today for pre-op clearance.

## 2019-07-27 NOTE — Telephone Encounter (Signed)
Called pt to inform him of results and Dr. Georgeann Oppenheim suggestions.  Unable to contact, LVM to return call

## 2019-07-27 NOTE — Telephone Encounter (Signed)
-----   Message from [name], MD sent at 07/17/2019  9:52 AM EDT ----- Sherald Hess  Please inform patient that he needs a vaccine for hepatitis A and B which he can come to our office and have it provided at any time.  He also has a low iron level and I would suggest to commence on ferrous sulfate 325 mg every other day.  I would also suggest that we proceed with upper endoscopy to be to screen for esophageal varices and any evidence of blood loss contributing to the anemia.  C/c Birdie Sons, MD   Dr [name] MD,MRCP [facility]) Gastroenterology/Hepatology Pager: [phone]

## 2019-07-27 NOTE — Progress Notes (Signed)
Lupron IM Injection   Due to Prostate Cancer patient is present today for a Lupron Injection.  Medication: Lupron 4 month Dose: 30 mg  Location: left upper outer buttocks Lot: [id number] Exp: 07/06/2021  Patient tolerated well, no complications were noted  Performed by: Elberta Leatherwood, CMA  Follow up: 4 months

## 2019-07-28 ENCOUNTER — Other Ambulatory Visit: Payer: Self-pay

## 2019-07-28 DIAGNOSIS — K7460 Unspecified cirrhosis of liver: Secondary | ICD-10-CM

## 2019-07-28 LAB — URINALYSIS, COMPLETE
Bilirubin, UA: NEGATIVE
Glucose, UA: NEGATIVE
Ketones, UA: NEGATIVE
Leukocytes,UA: NEGATIVE
Nitrite, UA: NEGATIVE
Protein,UA: NEGATIVE
RBC, UA: NEGATIVE
Specific Gravity, UA: 1.025 (ref 1.005–1.030)
Urobilinogen, Ur: 0.2 mg/dL (ref 0.2–1.0)
pH, UA: 5 (ref 5.0–7.5)

## 2019-07-28 LAB — MICROSCOPIC EXAMINATION
Bacteria, UA: NONE SEEN
RBC, Urine: NONE SEEN /hpf (ref 0–2)

## 2019-08-02 ENCOUNTER — Telehealth: Payer: Self-pay

## 2019-08-02 NOTE — Telephone Encounter (Signed)
Pt stopped by our office to reschedule endoscopy procedure to 08-16-19. [facility] has been notified.

## 2019-08-04 ENCOUNTER — Encounter: Admission: RE | Admit: 2019-08-04 | Payer: PPO | Source: Ambulatory Visit

## 2019-08-11 ENCOUNTER — Other Ambulatory Visit: Payer: Self-pay

## 2019-08-11 ENCOUNTER — Other Ambulatory Visit
Admission: RE | Admit: 2019-08-11 | Discharge: 2019-08-11 | Disposition: A | Discharge status: Home / Self Care | Payer: PPO | Source: Ambulatory Visit | Attending: Gastroenterology | Admitting: Gastroenterology

## 2019-08-11 DIAGNOSIS — Z01812 Encounter for preprocedural laboratory examination: Secondary | ICD-10-CM | POA: Diagnosis not present

## 2019-08-11 DIAGNOSIS — Z20828 Contact with and (suspected) exposure to other viral communicable diseases: Secondary | ICD-10-CM | POA: Diagnosis not present

## 2019-08-12 LAB — SARS CORONAVIRUS 2 (TAT 6-24 HRS): SARS Coronavirus 2: NEGATIVE

## 2019-08-16 ENCOUNTER — Ambulatory Visit
Admission: RE | Admit: 2019-08-16 | Discharge: 2019-08-16 | Disposition: A | Discharge status: Home / Self Care | Payer: PPO | Attending: Gastroenterology | Admitting: Gastroenterology

## 2019-08-16 ENCOUNTER — Encounter
Admission: RE | Disposition: A | Discharge status: Home / Self Care | Payer: Self-pay | Source: Home / Self Care | Attending: Gastroenterology

## 2019-08-16 ENCOUNTER — Encounter: Payer: Self-pay | Admitting: Anesthesiology

## 2019-08-16 ENCOUNTER — Ambulatory Visit: Payer: PPO | Admitting: Anesthesiology

## 2019-08-16 ENCOUNTER — Other Ambulatory Visit: Payer: Self-pay

## 2019-08-16 DIAGNOSIS — Z79899 Other long term (current) drug therapy: Secondary | ICD-10-CM | POA: Insufficient documentation

## 2019-08-16 DIAGNOSIS — K766 Portal hypertension: Secondary | ICD-10-CM | POA: Insufficient documentation

## 2019-08-16 DIAGNOSIS — I4891 Unspecified atrial fibrillation: Secondary | ICD-10-CM | POA: Diagnosis not present

## 2019-08-16 DIAGNOSIS — Z7901 Long term (current) use of anticoagulants: Secondary | ICD-10-CM | POA: Insufficient documentation

## 2019-08-16 DIAGNOSIS — Z87891 Personal history of nicotine dependence: Secondary | ICD-10-CM | POA: Diagnosis not present

## 2019-08-16 DIAGNOSIS — Z85828 Personal history of other malignant neoplasm of skin: Secondary | ICD-10-CM | POA: Insufficient documentation

## 2019-08-16 DIAGNOSIS — K317 Polyp of stomach and duodenum: Secondary | ICD-10-CM | POA: Insufficient documentation

## 2019-08-16 DIAGNOSIS — E119 Type 2 diabetes mellitus without complications: Secondary | ICD-10-CM | POA: Diagnosis not present

## 2019-08-16 DIAGNOSIS — Z8673 Personal history of transient ischemic attack (TIA), and cerebral infarction without residual deficits: Secondary | ICD-10-CM | POA: Insufficient documentation

## 2019-08-16 DIAGNOSIS — E039 Hypothyroidism, unspecified: Secondary | ICD-10-CM | POA: Insufficient documentation

## 2019-08-16 DIAGNOSIS — I10 Essential (primary) hypertension: Secondary | ICD-10-CM | POA: Insufficient documentation

## 2019-08-16 DIAGNOSIS — K219 Gastro-esophageal reflux disease without esophagitis: Secondary | ICD-10-CM | POA: Diagnosis not present

## 2019-08-16 DIAGNOSIS — K7460 Unspecified cirrhosis of liver: Secondary | ICD-10-CM | POA: Diagnosis not present

## 2019-08-16 HISTORY — PX: ESOPHAGOGASTRODUODENOSCOPY (EGD) WITH PROPOFOL: SHX5813

## 2019-08-16 LAB — GLUCOSE, CAPILLARY: Glucose-Capillary: 135 mg/dL — ABNORMAL HIGH (ref 70–99)

## 2019-08-16 SURGERY — ESOPHAGOGASTRODUODENOSCOPY (EGD) WITH PROPOFOL
Anesthesia: General

## 2019-08-16 MED ORDER — PROPOFOL 500 MG/50ML IV EMUL
INTRAVENOUS | Status: AC
  Filled 2019-08-16: qty 50

## 2019-08-16 MED ORDER — BUTAMBEN-TETRACAINE-BENZOCAINE 2-2-14 % EX AERO
INHALATION_SPRAY | CUTANEOUS | Status: DC | PRN
  Administered 2019-08-16: 2 via TOPICAL

## 2019-08-16 MED ORDER — BUTAMBEN-TETRACAINE-BENZOCAINE 2-2-14 % EX AERO
INHALATION_SPRAY | CUTANEOUS | Status: AC
  Filled 2019-08-16: qty 5

## 2019-08-16 MED ORDER — PROPOFOL 500 MG/50ML IV EMUL
INTRAVENOUS | Status: DC | PRN
  Administered 2019-08-16: 120 ug/kg/min via INTRAVENOUS

## 2019-08-16 MED ORDER — SODIUM CHLORIDE 0.9 % IV SOLN
INTRAVENOUS | Status: DC
  Administered 2019-08-16: 13:00:00 via INTRAVENOUS

## 2019-08-16 NOTE — Anesthesia Post-op Follow-up Note (Signed)
Anesthesia QCDR form completed.        

## 2019-08-16 NOTE — Transfer of Care (Signed)
Immediate Anesthesia Transfer of Care Note  Patient: Ian Sanders  Procedure(s) Performed: ESOPHAGOGASTRODUODENOSCOPY (EGD) WITH PROPOFOL (N/A )  Patient Location: PACU  Anesthesia Type:General  Level of Consciousness: awake and sedated  Airway & Oxygen Therapy: Patient Spontanous Breathing and Patient connected to nasal cannula oxygen  Post-op Assessment: Report given to RN and Post -op Vital signs reviewed and stable  Post vital signs: Reviewed and stable  Last Vitals:  Vitals Value Taken Time  BP    Temp    Pulse    Resp    SpO2      Last Pain:  Vitals:   08/16/19 1250  TempSrc: Tympanic         Complications: No apparent anesthesia complications

## 2019-08-16 NOTE — H&P (Signed)
[name], MD [address] [place], [place], [place], [address] [address], [place], [place], [place], [address] Phone: [phone]  Fax: [phone]  Primary Care Physician:  Birdie Sons, MD   Pre-Procedure History & Physical: HPI:  Ian Sanders is a 78 y.o. male is here for an endoscopy    Past Medical History:  Diagnosis Date  . Atrial fibrillation ([place])   . Basal cell carcinoma   . Benign paroxysmal positional vertigo   . Cancer ([place])   . History of chicken pox   . History of measles   . History of mumps   . Hypertension   . Mononeuritis of unspecified site   . Other seborrheic keratosis   . Stroke ([place])   . 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 Unspecified hypothyroidism     Past Surgical History:  Procedure Laterality Date  . Carotid Doppler Ultrasound  04/02/2010   Small amount Calcified plaque bilaterally, no significant stenosis.  . COLONOSCOPY  05/11/2014   Tubular Adenoma. Dr. Allen Norris  . COLONOSCOPY WITH PROPOFOL N/A 05/23/2018   Procedure: COLONOSCOPY WITH PROPOFOL;  Surgeon: [name], MD;  Location[facility] ENDOSCOPY;  Service: Gastroenterology;  Laterality: N/A;  . DOPPLER ECHOCARDIOGRAPHY  04/02/2010   Mild left atrial dilation. Normal right atrium. No valvular disease. No thrombus. Normal LV function. EF>55%  . FINGER AMPUTATION    . HERNIA REPAIR    . MRI Brain with and without contrast  03/20/2010   Suggestive of basal ganglia lacunar infarct  . PROSTATECTOMY  1995   Abdominal  . SKIN SURGERY     multiple  . Thumb surgery      Prior to Admission medications   Medication Sig Start Date End Date Taking? Authorizing Provider  atenolol (TENORMIN) 50 MG tablet Take 1 tablet (50 mg total) by mouth daily. 08/03/18  Yes Gollan, Kathlene November, MD  calcium-vitamin D (OSCAL) 250-125 MG-UNIT per tablet Take 1 tablet by mouth 2 (two) times daily.    Yes [provider]   chlorthalidone (HYGROTON) 25 MG tablet Take 1 tablet (25 mg total) by mouth daily. Patient taking differently: Take 25 mg by mouth every other day.  07/15/17  Yes Minna Merritts, MD  Cyanocobalamin (VITAMIN B-12 PO) Take by mouth daily.   Yes [provider]  ferrous sulfate (FERROUSUL) 325 (65 FE) MG tablet Take 1 tablet (325 mg total) by mouth every other day. 07/27/19  Yes [name], MD  fesoterodine (TOVIAZ) 4 MG TB24 tablet Take 1 tablet (4 mg total) by mouth daily. 01/24/19  Yes McGowan, Larene Beach A, PA-C  flecainide (TAMBOCOR) 50 MG tablet Take 1 tablet (50 mg total) by mouth 2 (two) times daily. 07/15/17  Yes Minna Merritts, MD  gabapentin (NEURONTIN) 100 MG capsule Take 1 capsule (100 mg total) by mouth 3 (three) times daily. 10/04/18  Yes Birdie Sons, MD  lactulose (CHRONULAC) 10 GM/15ML solution Take 15 mLs (10 g total) by mouth 2 (two) times daily. 07/13/19 10/11/19 Yes [name], MD  Leuprolide Acetate, 6 Month, (LUPRON) 45 MG injection Inject 45 mg into the muscle every 6 (six) months.   Yes [provider]  omeprazole (PRILOSEC) 40 MG capsule TAKE ONE CAPSULE DAILY. 06/06/18  Yes Birdie Sons, MD  rivaroxaban (XARELTO) 20 MG TABS tablet Take 1 tablet (20 mg total) by mouth daily. 08/21/16  Yes Gollan, Kathlene November, MD  traMADol (ULTRAM) 50 MG  tablet Take 1 tablet (50 mg total) by mouth every 6 (six) hours as needed. 12/14/18  Yes Birdie Sons, MD  zolpidem (AMBIEN) 10 MG tablet TAKE ONE-HALF TO ONE TABLET AT BEDTIME. 06/08/19  Yes Birdie Sons, MD  BIOTIN PO Take by mouth daily.    [provider]  cyclobenzaprine (FLEXERIL) 10 MG tablet Take 0.5 tablets (5 mg total) by mouth 2 (two) times daily as needed. Patient not taking: Reported on 08/16/2019 01/17/18   Birdie Sons, MD  Docusate Calcium (STOOL SOFTENER PO) Take by mouth as needed.    [provider]  EPINEPHrine (EPIPEN 2-PAK) 0.3 mg/0.3 mL IJ SOAJ injection Inject 0.3 mg into  the muscle once.  02/12/15   [provider]    Allergies as of 07/28/2019 - Review Complete 07/27/2019  Allergen Reaction Noted  . Lisinopril Swelling 02/12/2015  . Myrbetriq [mirabegron] Swelling 12/26/2015  . Atorvastatin Other (See Comments) 06/19/2015  . Dabigatran etexilate mesylate  12/14/2011  . Dabigatran etexilate mesylate Other (See Comments) 12/14/2011    Family History  Problem Relation Age of Onset  . Heart disease Mother 34       CABG  . Hypertension Mother   . Heart attack Mother   . Hypertension Brother   . Heart disease Sister        stents  . Prostate cancer Brother   . Kidney cancer Brother   . Skin cancer Brother   . Hypertension Brother   . Bladder Cancer Sister   . Leukemia Father   . Stroke Father        multiple  . Cerebral aneurysm Sister   . Kidney cancer Brother     Social History   Socioeconomic History  . Marital status: Married    Spouse name: Not on file  . Number of children: 2  . Years of education: Not on file  . Highest education level: Associate degree: occupational, Hotel manager, or vocational program  Occupational History  . Occupation: Retired  Scientific laboratory technician  . Financial resource strain: Not hard at all  . Food insecurity    Worry: Never true    Inability: Never true  . Transportation needs    Medical: No    Non-medical: No  Tobacco Use  . Smoking status: Former Smoker    Packs/day: 0.00    Years: 12.00    Pack years: 0.00    Types: Cigarettes  . Smokeless tobacco: Current User    Types: Chew  . Tobacco comment: quit over 45 years ago  Substance and Sexual Activity  . Alcohol use: Yes    Alcohol/week: 5.0 standard drinks    Types: 5 Cans of beer per week  . Drug use: No  . Sexual activity: Not on file  Lifestyle  . Physical activity    Days per week: 0 days    Minutes per session: 0 min  . Stress: Not at all  Relationships  . Social Herbalist on phone: Patient refused    Gets together:  Patient refused    Attends religious service: Patient refused    Active member of club or organization: Patient refused    Attends meetings of clubs or organizations: Patient refused    Relationship status: Patient refused  . Intimate partner violence    Fear of current or ex partner: Patient refused    Emotionally abused: Patient refused    Physically abused: Patient refused    Forced sexual activity: Patient  refused  Other Topics Concern  . 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     Review of Systems: See HPI, otherwise negative ROS  Physical Exam: BP 135/60   Pulse (!) 56   Temp (!) 97.5 F (36.4 C) (Tympanic)   Resp 18  General:   Alert,  pleasant and cooperative in NAD Head:  Normocephalic and atraumatic. Neck:  Supple; no masses or thyromegaly. Lungs:  Clear throughout to auscultation, normal respiratory effort.    Heart:  +S1, +S2, Regular rate and rhythm, No edema. Abdomen:  Soft, nontender and nondistended. Normal bowel sounds, without guarding, and without rebound.   Neurologic:  Alert and  oriented x4;  grossly normal neurologically.  Impression/Plan: Ian Sanders is here for an endoscopy  to be performed for  evaluation of esophageal varices    Risks, benefits, limitations, and alternatives regarding endoscopy have been reviewed with the patient.  Questions have been answered.  All parties agreeable.   [name], MD  08/16/2019, 1:00 PM

## 2019-08-16 NOTE — Anesthesia Procedure Notes (Signed)
Performed by: Cook-Martin, Kailen Name Pre-anesthesia Checklist: Patient identified, Emergency Drugs available, Suction available, Patient being monitored and Timeout performed Patient Re-evaluated:Patient Re-evaluated prior to induction Oxygen Delivery Method: Nasal cannula Preoxygenation: Pre-oxygenation with 100% oxygen Induction Type: IV induction Airway Equipment and Method: Bite block Placement Confirmation: positive ETCO2 and CO2 detector       

## 2019-08-16 NOTE — Op Note (Signed)
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 Caryn Section, MD (Referring MD) Medicines:            Monitored Anesthesia Care Complications:        No immediate complications. Procedure:            Pre-Anesthesia Assessment:                       - Prior to the procedure, a History and Physical was                        performed, and patient medications, allergies and                        sensitivities were reviewed. The patient's tolerance of                        previous anesthesia was reviewed.                       - The risks and benefits of the procedure and the                        sedation options and risks were discussed with the                        patient. All questions were answered and informed                        consent was obtained.                       - ASA Grade Assessment: III - A patient with severe                        systemic disease.                       After obtaining informed consent, the endoscope was                        passed under direct vision. Throughout the procedure,                        the patient's blood pressure, pulse, and oxygen                        saturations were monitored continuously. The Endoscope                        was introduced through the mouth, and advanced to the  third part of duodenum. The upper GI endoscopy was                        accomplished with ease. The patient tolerated the                        procedure well. Findings:      The examined duodenum was normal.      A few 4 to 7 mm  sessile polyps with no stigmata of recent bleeding were       found in the cardia. Biopsies were taken with a cold forceps for       histology.      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      The cardia and gastric fundus were normal on retroflexion.      The esophagus was normal. Impression:           - Normal examined duodenum.                       - A few gastric polyps. Biopsied.                       - Portal hypertensive gastropathy.                       - Normal esophagus. Recommendation:       - Discharge patient to home (with escort).                       - Resume previous diet.                       - Continue present medications.                       - Await pathology results.                       - Return to my office as previously scheduled. Procedure Code(s):    --- Professional ---                       [phone], Esophagogastroduodenoscopy, flexible, transoral;                        with biopsy, single or multiple Diagnosis Code(s):    --- Professional ---                       K31.7, Polyp of stomach and duodenum                       K76.6, Portal hypertension                       K31.89, Other diseases of stomach and duodenum                       K74.60, Unspecified cirrhosis of liver CPT copyright 2019 American Medical Association. All rights reserved. The codes documented in this report are preliminary and upon coder review may  be revised to meet current compliance requirements. [name], MD [name] MD, MD 08/16/2019 1:23:02 PM This report has been signed electronically. Number  of Addenda: 0 Note Initiated On: 08/16/2019 10:44 AM Estimated Blood Loss: Estimated blood loss: none.      [facility]

## 2019-08-16 NOTE — Anesthesia Preprocedure Evaluation (Signed)
Anesthesia Evaluation  Patient identified by MRN, date of birth, ID band Patient awake    Reviewed: Allergy & Precautions, NPO status , Patient's Chart, lab work & pertinent test results  History of Anesthesia Complications Negative for: history of anesthetic complications  Airway Mallampati: II  TM Distance: >3 FB Neck ROM: Full    Dental  (+) Poor Dentition, Missing   Pulmonary neg sleep apnea, neg COPD, former smoker,    breath sounds clear to auscultation- rhonchi (-) wheezing      Cardiovascular hypertension, Pt. on medications (-) CAD, (-) Past MI, (-) Cardiac Stents and (-) CABG  Rhythm:Regular Rate:Normal - Systolic murmurs and - Diastolic murmurs    Neuro/Psych  Headaches, PSYCHIATRIC DISORDERS Depression CVA, No Residual Symptoms    GI/Hepatic Neg liver ROS, GERD  ,  Endo/Other  diabetesHypothyroidism   Renal/GU negative Renal ROS     Musculoskeletal negative musculoskeletal ROS (+)   Abdominal (+) - obese,   Peds  Hematology negative hematology ROS (+)   Anesthesia Other Findings Past Medical History: No date: Atrial fibrillation (HCC) No date: Basal cell carcinoma No date: Benign paroxysmal positional vertigo No date: Cancer ([place]) No date: History of chicken pox No date: History of measles No date: History of mumps No date: Hypertension No date: Mononeuritis of unspecified site No date: Other seborrheic keratosis No date: Stroke [facility]) No date: Transient ischemic attack (TIA), 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) No date: Unspecified hypothyroidism   Reproductive/Obstetrics                             Anesthesia Physical Anesthesia Plan  ASA: III  Anesthesia Plan: General   Post-op Pain Management:    Induction: Intravenous  PONV Risk Score and Plan: 1 and Propofol infusion  Airway Management Planned: Natural Airway  Additional  Equipment:   Intra-op Plan:   Post-operative Plan:   Informed Consent: I have reviewed the patients History and Physical, chart, labs and discussed the procedure including the risks, benefits and alternatives for the proposed anesthesia with the patient or authorized representative who has indicated his/her understanding and acceptance.     Dental advisory given  Plan Discussed with: CRNA and Anesthesiologist  Anesthesia Plan Comments:         Anesthesia Quick Evaluation

## 2019-08-17 LAB — SURGICAL PATHOLOGY

## 2019-08-18 ENCOUNTER — Encounter: Payer: Self-pay | Admitting: Gastroenterology

## 2019-08-18 NOTE — Anesthesia Postprocedure Evaluation (Signed)
Anesthesia Post Note  Patient: CAMDAN FLORESCA  Procedure(s) Performed: ESOPHAGOGASTRODUODENOSCOPY (EGD) WITH PROPOFOL (N/A )  Patient location during evaluation: Endoscopy Anesthesia Type: General Level of consciousness: awake and alert and oriented Pain management: pain level controlled Vital Signs Assessment: post-procedure vital signs reviewed and stable Respiratory status: spontaneous breathing, nonlabored ventilation and respiratory function stable Cardiovascular status: blood pressure returned to baseline and stable Postop Assessment: no signs of nausea or vomiting Anesthetic complications: no     Last Vitals:  Vitals:   08/16/19 1342 08/16/19 1350  BP:  112/62  Pulse: (!) 56 (!) 54  Resp: 17 (!) 21  Temp:    SpO2: 96% 99%    Last Pain:  Vitals:   08/17/19 0729  TempSrc:   PainSc: 0-No pain                 Erianna Jolly

## 2019-08-24 ENCOUNTER — Ambulatory Visit: Payer: PPO | Admitting: Gastroenterology

## 2019-08-24 ENCOUNTER — Encounter: Payer: Self-pay | Admitting: Gastroenterology

## 2019-08-24 ENCOUNTER — Encounter (INDEPENDENT_AMBULATORY_CARE_PROVIDER_SITE_OTHER): Payer: Self-pay

## 2019-08-24 ENCOUNTER — Other Ambulatory Visit: Payer: Self-pay

## 2019-08-24 VITALS — BP 149/76 | HR 76 | Temp 98.3°F | Ht 69.0 in | Wt 199.2 lb

## 2019-08-24 DIAGNOSIS — K7460 Unspecified cirrhosis of liver: Secondary | ICD-10-CM | POA: Diagnosis not present

## 2019-08-24 DIAGNOSIS — Z23 Encounter for immunization: Secondary | ICD-10-CM

## 2019-08-24 DIAGNOSIS — D509 Iron deficiency anemia, unspecified: Secondary | ICD-10-CM

## 2019-08-24 NOTE — Progress Notes (Signed)
Ian Bellows MD, MRCP(U.K) [address]  [place]  [place], [place] [address]  Main: [phone]  Fax: [phone]   Primary Care Physician: Birdie Sons, MD  Primary Gastroenterologist:  Dr. Jonathon Sanders   Follow-up for cirrhosis of the liver  HPI: Ian Sanders is a 78 y.o. male    Summary of history :  He was initially referred and seen on 07/13/2019 for liver cirrhosis.  He has a history of alcohol use and is on Xarelto for atrial fibrillation.  He has a history of prostate cancer.  She has served in [employer] and spent time at [place].  He had been drinking alcohol almost daily for many years.  Blood transfusion in 1995.  Denies any prior illegal drug use.  05/17/2018 MRI of the abdomen shows hepatic cirrhosis.  Moderate splenomegaly.  Cholelithiasis. 05/26/2019: Ultrasound of the abdomen complete: Moderate to severe splenomegaly, cholelithiasis, no ascites.  Labs 06/05/2019: Hemoglobin 11.7, MCV 82, platelet count 63.  B12 939. 05/16/2019 CMP: Albumin 4.5 AST 42 ALT 21 alkaline phosphatase 67.  At his initial visit denied any constipation or issues with memory to suggest hepatic encephalopathy.     Interval history 07/13/2019-08/24/2019  08/16/2019: EGD: Gastric polyp showed hyperplastic polyp with ulceration.  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  07/13/2019: Not immune to hepatitis A and B and need vaccination.  Low level of iron.  Negative hepatitis B and C.  ANA positive.  Negative smooth muscle antibody.  Ferritin of 25 and iron of 60  .  Celiac serology negative. Negative AMA and smooth muscle antibody.  He is taking iron tablets.  Denies any overt blood loss.  Denies any confusion.  Tried lactulose but it caused severe cramping and he stopped.  Willing to try again at a lower dose.  Current Outpatient Medications  Medication Sig Dispense Refill  . atenolol (TENORMIN) 50 MG tablet Take 1 tablet (50 mg total) by mouth daily. 30  tablet 0  . BIOTIN PO Take by mouth daily.    . calcium-vitamin D (OSCAL) 250-125 MG-UNIT per tablet Take 1 tablet by mouth 2 (two) times daily.     . chlorthalidone (HYGROTON) 25 MG tablet Take 1 tablet (25 mg total) by mouth daily. (Patient taking differently: Take 25 mg by mouth every other day. ) 90 tablet 3  . Cyanocobalamin (VITAMIN B-12 PO) Take by mouth daily.    . cyclobenzaprine (FLEXERIL) 10 MG tablet Take 0.5 tablets (5 mg total) by mouth 2 (two) times daily as needed. (Patient not taking: Reported on 08/16/2019) 30 tablet 3  . Docusate Calcium (STOOL SOFTENER PO) Take by mouth as needed.    Marland Kitchen EPINEPHrine (EPIPEN 2-PAK) 0.3 mg/0.3 mL IJ SOAJ injection Inject 0.3 mg into the muscle once.     . ferrous sulfate (FERROUSUL) 325 (65 FE) MG tablet Take 1 tablet (325 mg total) by mouth every other day. 90 tablet 0  . fesoterodine (TOVIAZ) 4 MG TB24 tablet Take 1 tablet (4 mg total) by mouth daily. 30 tablet 3  . flecainide (TAMBOCOR) 50 MG tablet Take 1 tablet (50 mg total) by mouth 2 (two) times daily. 180 tablet 3  . gabapentin (NEURONTIN) 100 MG capsule Take 1 capsule (100 mg total) by mouth 3 (three) times daily. 270 capsule 3  . lactulose (CHRONULAC) 10 GM/15ML solution Take 15 mLs (10 g total) by mouth 2 (two) times daily. 900 mL 2  . Leuprolide Acetate, 6 Month, (LUPRON) 45 MG injection  Inject 45 mg into the muscle every 6 (six) months.    Marland Kitchen omeprazole (PRILOSEC) 40 MG capsule TAKE ONE CAPSULE DAILY. 90 capsule 4  . rivaroxaban (XARELTO) 20 MG TABS tablet Take 1 tablet (20 mg total) by mouth daily. 30 tablet 11  . traMADol (ULTRAM) 50 MG tablet Take 1 tablet (50 mg total) by mouth every 6 (six) hours as needed. 30 tablet 1  . zolpidem (AMBIEN) 10 MG tablet TAKE ONE-HALF TO ONE TABLET AT BEDTIME. 90 tablet 0   Current Facility-Administered Medications  Medication Dose Route Frequency Provider Last Rate Last Dose  . lidocaine (XYLOCAINE) 2 % jelly 1 application  1 application Urethral  Once Stoioff, Ronda Fairly, MD        Allergies as of 08/24/2019 - Review Complete 08/16/2019  Allergen Reaction Noted  . Lisinopril Swelling 02/12/2015  . Myrbetriq [mirabegron] Swelling 12/26/2015  . Atorvastatin Other (See Comments) 06/19/2015  . Dabigatran etexilate mesylate Other (See Comments) 12/14/2011  . Dabigatran etexilate mesylate Other (See Comments) 12/14/2011    ROS:  General: Negative for anorexia, weight loss, fever, chills, fatigue, weakness. ENT: Negative for hoarseness, difficulty swallowing , nasal congestion. CV: Negative for chest pain, angina, palpitations, dyspnea on exertion, peripheral edema.  Respiratory: Negative for dyspnea at rest, dyspnea on exertion, cough, sputum, wheezing.  GI: See history of present illness. GU:  Negative for dysuria, hematuria, urinary incontinence, urinary frequency, nocturnal urination.  Endo: Negative for unusual weight change.    Physical Examination:   There were no vitals taken for this visit.  General: Well-nourished, well-developed in no acute distress.  Eyes: No icterus. Conjunctivae pink. Mouth: Oropharyngeal mucosa moist and pink , no lesions erythema or exudate. Lungs: Clear to auscultation bilaterally. Non-labored. Heart: Regular rate and rhythm, no murmurs rubs or gallops.  Abdomen: Bowel sounds are normal, nontender, nondistended, no hepatosplenomegaly or masses, no abdominal bruits or hernia , no rebound or guarding.   Extremities: No lower extremity edema. No clubbing or deformities. Neuro: Alert and oriented x 3.  Grossly intact. Skin: Warm and dry, no jaundice.   Psych: Alert and cooperative, normal mood and affect.   Imaging Studies: No results found.  Assessment and Plan:   Ian Sanders is a 78 y.o. 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  Compensated.  No hepatic encephalopathy no NSAID use.  He does have portal hypertensive gastropathy.  Some iron  deficiency.  No recent CBC   Plan 1.  Needs hepatitis a and B vaccine. 2.  Right upper quadrant ultrasound every 6 monthly to screen for [facility]. 3.  EGD to screen for esophageal varices in 2023.. 4.  Stop alcohol consumption 5.  Check CMP and INR to calculate meld score.  Provided lab slip to obtain. 6.    Restart lactulose at half the dose. 7.  No NSAIDs. 8.  Discussed briefly about repeat colonoscopy.  If CBC shows severe anemia he may need a capsule study of the small bowel as he had a colonoscopy by myself in July 2019.   Dr Ian Bellows  MD,MRCP [facility]) Follow up in 3 months

## 2019-08-24 NOTE — Addendum Note (Signed)
Addended by: Dorethea Clan on: 08/24/2019 03:36 PM   Modules accepted: Orders

## 2019-09-04 ENCOUNTER — Ambulatory Visit: Payer: PPO | Admitting: Internal Medicine

## 2019-09-04 ENCOUNTER — Other Ambulatory Visit: Payer: PPO

## 2019-09-10 ENCOUNTER — Encounter: Payer: Self-pay | Admitting: Gastroenterology

## 2019-09-12 ENCOUNTER — Other Ambulatory Visit: Payer: Self-pay

## 2019-09-12 ENCOUNTER — Encounter: Payer: Self-pay | Admitting: Oncology

## 2019-09-12 NOTE — Progress Notes (Signed)
Called patient no answer left message  

## 2019-09-13 ENCOUNTER — Other Ambulatory Visit: Payer: Self-pay

## 2019-09-13 ENCOUNTER — Inpatient Hospital Stay (HOSPITAL_BASED_OUTPATIENT_CLINIC_OR_DEPARTMENT_OTHER): Payer: PPO | Admitting: Oncology

## 2019-09-13 ENCOUNTER — Inpatient Hospital Stay: Payer: PPO | Attending: Oncology

## 2019-09-13 ENCOUNTER — Ambulatory Visit: Payer: PPO | Admitting: Internal Medicine

## 2019-09-13 ENCOUNTER — Other Ambulatory Visit: Payer: PPO

## 2019-09-13 VITALS — BP 149/72 | HR 55 | Temp 97.4°F | Resp 18 | Wt 198.3 lb

## 2019-09-13 DIAGNOSIS — Z9079 Acquired absence of other genital organ(s): Secondary | ICD-10-CM | POA: Diagnosis not present

## 2019-09-13 DIAGNOSIS — Z8052 Family history of malignant neoplasm of bladder: Secondary | ICD-10-CM | POA: Insufficient documentation

## 2019-09-13 DIAGNOSIS — Z808 Family history of malignant neoplasm of other organs or systems: Secondary | ICD-10-CM | POA: Diagnosis not present

## 2019-09-13 DIAGNOSIS — E039 Hypothyroidism, unspecified: Secondary | ICD-10-CM | POA: Insufficient documentation

## 2019-09-13 DIAGNOSIS — D500 Iron deficiency anemia secondary to blood loss (chronic): Secondary | ICD-10-CM

## 2019-09-13 DIAGNOSIS — I4891 Unspecified atrial fibrillation: Secondary | ICD-10-CM | POA: Insufficient documentation

## 2019-09-13 DIAGNOSIS — Z8249 Family history of ischemic heart disease and other diseases of the circulatory system: Secondary | ICD-10-CM | POA: Insufficient documentation

## 2019-09-13 DIAGNOSIS — K7460 Unspecified cirrhosis of liver: Secondary | ICD-10-CM

## 2019-09-13 DIAGNOSIS — Z85828 Personal history of other malignant neoplasm of skin: Secondary | ICD-10-CM | POA: Insufficient documentation

## 2019-09-13 DIAGNOSIS — D696 Thrombocytopenia, unspecified: Secondary | ICD-10-CM | POA: Diagnosis not present

## 2019-09-13 DIAGNOSIS — I10 Essential (primary) hypertension: Secondary | ICD-10-CM | POA: Diagnosis not present

## 2019-09-13 DIAGNOSIS — Z806 Family history of leukemia: Secondary | ICD-10-CM | POA: Insufficient documentation

## 2019-09-13 DIAGNOSIS — Z8051 Family history of malignant neoplasm of kidney: Secondary | ICD-10-CM | POA: Insufficient documentation

## 2019-09-13 DIAGNOSIS — Z7901 Long term (current) use of anticoagulants: Secondary | ICD-10-CM

## 2019-09-13 DIAGNOSIS — Z8546 Personal history of malignant neoplasm of prostate: Secondary | ICD-10-CM | POA: Insufficient documentation

## 2019-09-13 DIAGNOSIS — Z79899 Other long term (current) drug therapy: Secondary | ICD-10-CM | POA: Diagnosis not present

## 2019-09-13 DIAGNOSIS — Z8042 Family history of malignant neoplasm of prostate: Secondary | ICD-10-CM | POA: Insufficient documentation

## 2019-09-13 DIAGNOSIS — Z8673 Personal history of transient ischemic attack (TIA), and cerebral infarction without residual deficits: Secondary | ICD-10-CM | POA: Diagnosis not present

## 2019-09-13 LAB — CBC WITH DIFFERENTIAL/PLATELET
Abs Immature Granulocytes: 0.02 10*3/uL (ref 0.00–0.07)
Basophils Absolute: 0 10*3/uL (ref 0.0–0.1)
Basophils Relative: 0 %
Eosinophils Absolute: 0.1 10*3/uL (ref 0.0–0.5)
Eosinophils Relative: 5 %
HCT: 35.6 % — ABNORMAL LOW (ref 39.0–52.0)
Hemoglobin: 11.2 g/dL — ABNORMAL LOW (ref 13.0–17.0)
Immature Granulocytes: 1 %
Lymphocytes Relative: 20 %
Lymphs Abs: 0.5 10*3/uL — ABNORMAL LOW (ref 0.7–4.0)
MCH: 25.3 pg — ABNORMAL LOW (ref 26.0–34.0)
MCHC: 31.5 g/dL (ref 30.0–36.0)
MCV: 80.4 fL (ref 80.0–100.0)
Monocytes Absolute: 0.2 10*3/uL (ref 0.1–1.0)
Monocytes Relative: 9 %
Neutro Abs: 1.8 10*3/uL (ref 1.7–7.7)
Neutrophils Relative %: 65 %
Platelets: 52 10*3/uL — ABNORMAL LOW (ref 150–400)
RBC: 4.43 MIL/uL (ref 4.22–5.81)
RDW: 15.1 % (ref 11.5–15.5)
WBC: 2.7 10*3/uL — ABNORMAL LOW (ref 4.0–10.5)
nRBC: 0 % (ref 0.0–0.2)

## 2019-09-13 LAB — COMPREHENSIVE METABOLIC PANEL
ALT: 14 U/L (ref 0–44)
AST: 27 U/L (ref 15–41)
Albumin: 4 g/dL (ref 3.5–5.0)
Alkaline Phosphatase: 69 U/L (ref 38–126)
Anion gap: 9 (ref 5–15)
BUN: 10 mg/dL (ref 8–23)
CO2: 28 mmol/L (ref 22–32)
Calcium: 8.7 mg/dL — ABNORMAL LOW (ref 8.9–10.3)
Chloride: 102 mmol/L (ref 98–111)
Creatinine, Ser: 1.03 mg/dL (ref 0.61–1.24)
GFR calc Af Amer: >60 mL/min (ref >60)
GFR calc non Af Amer: >60 mL/min (ref >60)
Glucose, Bld: 153 mg/dL — ABNORMAL HIGH (ref 70–99)
Potassium: 3.4 mmol/L — ABNORMAL LOW (ref 3.5–5.1)
Sodium: 139 mmol/L (ref 135–145)
Total Bilirubin: 0.7 mg/dL (ref 0.3–1.2)
Total Protein: 6.9 g/dL (ref 6.5–8.1)

## 2019-09-13 NOTE — Progress Notes (Signed)
Patient here for follow up. No concerns voiced.  °

## 2019-09-16 DIAGNOSIS — D500 Iron deficiency anemia secondary to blood loss (chronic): Secondary | ICD-10-CM | POA: Insufficient documentation

## 2019-09-16 DIAGNOSIS — Z7901 Long term (current) use of anticoagulants: Secondary | ICD-10-CM | POA: Insufficient documentation

## 2019-09-16 DIAGNOSIS — K7460 Unspecified cirrhosis of liver: Secondary | ICD-10-CM | POA: Insufficient documentation

## 2019-09-16 DIAGNOSIS — Z8546 Personal history of malignant neoplasm of prostate: Secondary | ICD-10-CM | POA: Insufficient documentation

## 2019-09-16 MED ORDER — FERROUS SULFATE 325 (65 FE) MG PO TABS: 325.0000 mg | ORAL_TABLET | ORAL | 0 refills | Status: DC

## 2019-09-16 NOTE — Progress Notes (Signed)
[place] NOTE  Patient Care Team: Birdie Sons, MD as PCP - General (Family Medicine) Laneta Simmers as Physician Assistant (Urology) Isaias Cowman, MD as Consulting Physician (Cardiology) Jannet Mantis, MD (Dermatology)  CHIEF COMPLAINTS/PURPOSE OF CONSULTATION: Thrombocytopenia   HEMATOLOGY HISTORY  Oncology History Overview Note  # THROMBOCYTOPENIA [platelets- ]; WBC- ; Hb-  # Prostate cancer [s/p Prostatectomy 1995]; elevation PSA-Lupron [Sharon McGovern]  # Cirrhosis [id number]; MRI; urology]; A.fib Xarelto [Dr. Paraschos]; colonoscopy 2019 Dr. Vicente Males   Prostate cancer [facility])  06/20/2015 Initial Diagnosis   Prostate cancer ([place])      HISTORY OF PRESENTING ILLNESS:  Ian Sanders 78 y.o.  male pleasant patient with history of alcohol use /cirrhosis /A. fib on Xarelto was been referred to [place] for further evaluation of thrombocytopenia.  Patient states that he was told to have cirrhosis the liver approximately 1 to 2 years ago which was incidentally noted on MRI of the abdomen/ordered by urology.  However more recently noted to have decline in his blood counts including white count/hemoglobin and platelets.  He admits to mild bruising.  Denies any gum bleeding or nosebleeds.  Patient admits to mild abdominal distention.  Admits to mild fatigue.  Chronic mild joint pains.   INTERVAL HISTORY Ian Sanders is a 78 y.o. male who has above history reviewed by me today presents for follow up visit for management of thrombocytopenia.  Patient follows up with Dr.Brahmanday who is off today, I am covering him to see this patient. Patient reports feeling at baseline.  Chronic fatigue unchanged. Denies hematochezia, hematuria, hematemesis, epistaxis, black tarry stool or easy bruising.     Review of Systems  Constitutional: Positive for malaise/fatigue. Negative for chills, diaphoresis, fever and weight loss.  HENT: Negative for  nosebleeds and sore throat.   Eyes: Negative for double vision and redness.  Respiratory: Negative for cough, hemoptysis, sputum production, shortness of breath and wheezing.   Cardiovascular: Negative for chest pain, palpitations, orthopnea and leg swelling.  Gastrointestinal: Negative for abdominal pain, blood in stool, constipation, diarrhea, heartburn, melena, nausea and vomiting.       Abdominal distension.   Genitourinary: Negative for dysuria, frequency and urgency.  Musculoskeletal: Positive for back pain and joint pain. Negative for myalgias.  Skin: Negative.  Negative for itching and rash.  Neurological: Negative for dizziness, tingling, tremors, focal weakness, weakness and headaches.  Endo/Heme/Allergies: Does not bruise/bleed easily.  Psychiatric/Behavioral: Negative for depression and hallucinations. The patient is not nervous/anxious and does not have insomnia.      MEDICAL HISTORY:  Past Medical History:  Diagnosis Date  . Atrial fibrillation ([place])   . Basal cell carcinoma   . Benign paroxysmal positional vertigo   . Cancer ([place])   . History of chicken pox   . History of measles   . History of mumps   . Hypertension   . Mononeuritis of unspecified site   . Other seborrheic keratosis   . Stroke ([place])   . 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 Unspecified hypothyroidism     SURGICAL HISTORY: Past Surgical History:  Procedure Laterality Date  . Carotid Doppler Ultrasound  04/02/2010   Small amount Calcified plaque bilaterally, no significant stenosis.  . COLONOSCOPY  05/11/2014   Tubular Adenoma. Dr. Allen Norris  . COLONOSCOPY WITH PROPOFOL N/A 05/23/2018   Procedure: COLONOSCOPY WITH PROPOFOL;  Surgeon: [name], MD;  Location: [facility] ENDOSCOPY;  Service: Gastroenterology;  Laterality:  N/A;  . DOPPLER ECHOCARDIOGRAPHY  04/02/2010   Mild left atrial dilation. Normal right atrium. No valvular disease. No thrombus.  Normal LV function. EF>55%  . ESOPHAGOGASTRODUODENOSCOPY (EGD) WITH PROPOFOL N/A 08/16/2019   Procedure: ESOPHAGOGASTRODUODENOSCOPY (EGD) WITH PROPOFOL;  Surgeon: [name], MD;  Location: [facility] ENDOSCOPY;  Service: Gastroenterology;  Laterality: N/A;  . FINGER AMPUTATION    . HERNIA REPAIR    . MRI Brain with and without contrast  03/20/2010   Suggestive of basal ganglia lacunar infarct  . PROSTATECTOMY  1995   Abdominal  . SKIN SURGERY     multiple  . Thumb surgery      SOCIAL HISTORY: Social History   Socioeconomic History  . Marital status: Married    Spouse name: Not on file  . Number of children: 2  . Years of education: Not on file  . Highest education level: Associate degree: occupational, Hotel manager, or vocational program  Occupational History  . Occupation: Retired  Scientific laboratory technician  . Financial resource strain: Not hard at all  . Food insecurity    Worry: Never true    Inability: Never true  . Transportation needs    Medical: No    Non-medical: No  Tobacco Use  . Smoking status: Former Smoker    Packs/day: 0.00    Years: 12.00    Pack years: 0.00    Types: Cigarettes  . Smokeless tobacco: Current User    Types: Chew  . Tobacco comment: quit over 45 years ago  Substance and Sexual Activity  . Alcohol use: Yes    Alcohol/week: 5.0 standard drinks    Types: 5 Cans of beer per week  . Drug use: No  . Sexual activity: Not on file  Lifestyle  . Physical activity    Days per week: 0 days    Minutes per session: 0 min  . Stress: Not at all  Relationships  . Social Herbalist on phone: Patient refused    Gets together: Patient refused    Attends religious service: Patient refused    Active member of club or organization: Patient refused    Attends meetings of clubs or organizations: Patient refused    Relationship status: Patient refused  . Intimate partner violence    Fear of current or ex partner: Patient refused    Emotionally abused: Patient  refused    Physically abused: Patient refused    Forced sexual activity: Patient refused  Other Topics Concern  . 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     FAMILY HISTORY: Family History  Problem Relation Age of Onset  . Heart disease Mother 40       CABG  . Hypertension Mother   . Heart attack Mother   . Hypertension Brother   . Heart disease Sister        stents  . Prostate cancer Brother   . Kidney cancer Brother   . Skin cancer Brother   . Hypertension Brother   . Bladder Cancer Sister   . Leukemia Father   . Stroke Father        multiple  . Cerebral aneurysm Sister   . Kidney cancer Brother     ALLERGIES:  is allergic to lisinopril; myrbetriq [mirabegron]; atorvastatin; dabigatran etexilate mesylate; and dabigatran etexilate mesylate.  MEDICATIONS:  Current Outpatient Medications  Medication Sig Dispense Refill  . atenolol (  TENORMIN) 50 MG tablet Take 1 tablet (50 mg total) by mouth daily. 30 tablet 0  . calcium-vitamin D (OSCAL) 250-125 MG-UNIT per tablet Take 1 tablet by mouth 2 (two) times daily.     . chlorthalidone (HYGROTON) 25 MG tablet Take 1 tablet (25 mg total) by mouth daily. (Patient taking differently: Take 25 mg by mouth every other day. ) 90 tablet 3  . Cyanocobalamin (VITAMIN B-12 PO) Take by mouth daily.    Mariane Baumgarten Calcium (STOOL SOFTENER PO) Take by mouth as needed.    . ferrous sulfate (FERROUSUL) 325 (65 FE) MG tablet Take 1 tablet (325 mg total) by mouth every other day. 90 tablet 0  . fesoterodine (TOVIAZ) 4 MG TB24 tablet Take 1 tablet (4 mg total) by mouth daily. 30 tablet 3  . flecainide (TAMBOCOR) 50 MG tablet Take 1 tablet (50 mg total) by mouth 2 (two) times daily. 180 tablet 3  . gabapentin (NEURONTIN) 100 MG capsule Take 1 capsule (100 mg total) by mouth 3 (three) times daily. 270 capsule 3  . Leuprolide Acetate, 6 Month, (LUPRON) 45 MG  injection Inject 45 mg into the muscle every 6 (six) months.    Marland Kitchen omeprazole (PRILOSEC) 40 MG capsule TAKE ONE CAPSULE DAILY. 90 capsule 4  . rivaroxaban (XARELTO) 20 MG TABS tablet Take 1 tablet (20 mg total) by mouth daily. 30 tablet 11  . traMADol (ULTRAM) 50 MG tablet Take 1 tablet (50 mg total) by mouth every 6 (six) hours as needed. 30 tablet 1  . zolpidem (AMBIEN) 10 MG tablet TAKE ONE-HALF TO ONE TABLET AT BEDTIME. 90 tablet 0  . cyclobenzaprine (FLEXERIL) 10 MG tablet Take 0.5 tablets (5 mg total) by mouth 2 (two) times daily as needed. (Patient not taking: Reported on 08/16/2019) 30 tablet 3  . EPINEPHrine (EPIPEN 2-PAK) 0.3 mg/0.3 mL IJ SOAJ injection Inject 0.3 mg into the muscle once.      Current Facility-Administered Medications  Medication Dose Route Frequency Provider Last Rate Last Dose  . lidocaine (XYLOCAINE) 2 % jelly 1 application  1 application Urethral Once Stoioff, Scott C, MD         .  PHYSICAL EXAMINATION:   Vitals:   09/13/19 1309  BP: (!) 149/72  Pulse: (!) 55  Resp: 18  Temp: (!) 97.4 F (36.3 C)   Filed Weights   09/13/19 1309  Weight: 198 lb 4.8 oz (89.9 kg)    Physical Exam  Constitutional: He is oriented to person, place, and time and well-developed, well-nourished, and in no distress. No distress.  HENT:  Head: Normocephalic and atraumatic.  Nose: Nose normal.  Mouth/Throat: Oropharynx is clear and moist. No oropharyngeal exudate.  Eyes: Pupils are equal, round, and reactive to light. EOM are normal. No scleral icterus.  Neck: Normal range of motion. Neck supple.  Cardiovascular: Normal rate and regular rhythm.  No murmur heard. Pulmonary/Chest: Effort normal. No respiratory distress. He has no wheezes. He has no rales. He exhibits no tenderness.  Abdominal: Soft. Bowel sounds are normal. He exhibits no distension and no mass. There is no abdominal tenderness. There is no rebound and no guarding.  Positive for splenomegaly.   Musculoskeletal: Normal range of motion.        General: No tenderness or edema.  Neurological: He is alert and oriented to person, place, and time. No cranial nerve deficit. He exhibits normal muscle tone. Coordination normal.  Skin: Skin is warm and dry. He is not diaphoretic. No erythema.  Psychiatric: Affect normal.     LABORATORY DATA:  I have reviewed the data as listed Lab Results  Component Value Date   WBC 2.7 (L) 09/13/2019   HGB 11.2 (L) 09/13/2019   HCT 35.6 (L) 09/13/2019   MCV 80.4 09/13/2019   PLT 52 (L) 09/13/2019   Recent Labs    05/16/19 1020 09/13/19 1250  NA 140 139  K 3.7 3.4*  CL 99 102  CO2 25 28  GLUCOSE 159* 153*  BUN 11 10  CREATININE 0.91 1.03  CALCIUM 8.8 8.7*  GFRNONAA 80 >60  GFRAA 93 >60  PROT 6.5 6.9  ALBUMIN 4.5 4.0  AST 42* 27  ALT 21 14  ALKPHOS 67 69  BILITOT 1.1 0.7   RADIOGRAPHIC STUDIES: I have personally reviewed the radiological images as listed and agreed with the findings in the report. No results found.  ASSESSMENT & PLAN:  1. Thrombocytopenia ([place])   2. Cirrhosis of liver without ascites, unspecified hepatic cirrhosis type ([place])   3. Iron deficiency anemia due to chronic blood loss   4. Chronic anticoagulation   5. 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  Labs reviewed and discussed with patient.  Platelet count of 52,000. No bleeding events.  Thrombocytopenia is most likely secondary to chronic liver disease/cirrhosis/hypersplenism/alcohol,  Chronic anticoagulation for A. fib.  Need to be closely monitored.  If platelet drops below 40-50,000, will need to stop Xarelto due to increased bleeding risk. Alcohol cessation discussed with patient.  Anemia, hemoglobin 11.2, mildly decreased.  Continue to monitor.  Iron deficiency, recommend patient to continue oral iron supplementation.  He has been recommended to proceed with colonoscopy/endoscopu if hemoglobin significant drops. Reviewed  oral iron supplementation prescription.  Cirrhosis is likely alcohol induced versus others.  Continue follow-up with gastroenterology.  Recommend [place] surveillance.  With ultrasound every 6 months.  Last ultrasound was done 05/26/2019.  Images were reviewed. Discussed with patient and recommend to obtain ultrasound in January.  Patient prefers to defer ultrasound for now. Check AFP Q80m  History of prostate cancer, follow-up with urology.  On Lupron. All questions were answered. The patient knows to call the clinic with any problems, questions or concerns. Follow up in 3 months with Dr.B  Earlie Server, MD 09/16/2019 3:37 PM

## 2019-09-25 ENCOUNTER — Ambulatory Visit: Payer: PPO

## 2019-09-25 ENCOUNTER — Telehealth: Payer: Self-pay | Admitting: Urology

## 2019-09-25 ENCOUNTER — Other Ambulatory Visit: Payer: Self-pay | Admitting: Family Medicine

## 2019-09-25 DIAGNOSIS — G629 Polyneuropathy, unspecified: Secondary | ICD-10-CM

## 2019-09-25 MED ORDER — FESOTERODINE FUMARATE ER 8 MG PO TB24: 8.0000 mg | ORAL_TABLET | Freq: Every day | ORAL | 11 refills | Status: DC

## 2019-09-25 NOTE — Telephone Encounter (Signed)
That is fine to increase his Toviaz to 8 mg daily.

## 2019-09-25 NOTE — Telephone Encounter (Signed)
Pt called and states that he feels like he meds to increase his Toviaz due to it not controlling his leakage like it used to. He wants to know if it can be increased? If it can not be called in to pharmacy, he requests a Virtual visit. Please advise.

## 2019-09-25 NOTE — Telephone Encounter (Signed)
Patient notified and Medication was sent to pharmacy.

## 2019-10-04 ENCOUNTER — Telehealth: Payer: Self-pay | Admitting: Urology

## 2019-10-04 NOTE — Telephone Encounter (Signed)
NO PA REQUIRED FOR LUPRON/ELIGARD 10-04-19 MICHELLE

## 2019-10-06 ENCOUNTER — Ambulatory Visit: Payer: PPO | Admitting: Family Medicine

## 2019-10-06 NOTE — Progress Notes (Deleted)
{Method of visit:23308}  Patient: Ian Sanders Male    DOB: [birth year]   78 y.o.   MRN: [id number] Visit Date: 10/06/2019  Today's Provider: Lelon Huh, MD   No chief complaint on file.  Subjective:     Laceration  The laceration is located on the right arm.  Last Td: 05/29/2016  Allergies  Allergen Reactions  . Lisinopril Swelling    Tongue swelling Tongue swelling  Tongue swelling Tongue swelling  Tongue swelling   . Myrbetriq [Mirabegron] Swelling    Of the tongue  . Atorvastatin Other (See Comments)    Dizziness and Fatigue  Dizziness and Fatigue  . Dabigatran Etexilate Mesylate Other (See Comments)    Stomach ulcers ([place])  . Dabigatran Etexilate Mesylate Other (See Comments)    Stomach ulcers (Pradaxa)     Current Outpatient Medications:  .  atenolol (TENORMIN) 50 MG tablet, Take 1 tablet (50 mg total) by mouth daily., Disp: 30 tablet, Rfl: 0 .  calcium-vitamin D (OSCAL) 250-125 MG-UNIT per tablet, Take 1 tablet by mouth 2 (two) times daily. , Disp: , Rfl:  .  chlorthalidone (HYGROTON) 25 MG tablet, Take 1 tablet (25 mg total) by mouth daily. (Patient taking differently: Take 25 mg by mouth every other day. ), Disp: 90 tablet, Rfl: 3 .  Cyanocobalamin (VITAMIN B-12 PO), Take by mouth daily., Disp: , Rfl:  .  cyclobenzaprine (FLEXERIL) 10 MG tablet, Take 0.5 tablets (5 mg total) by mouth 2 (two) times daily as needed. (Patient not taking: Reported on 08/16/2019), Disp: 30 tablet, Rfl: 3 .  Docusate Calcium (STOOL SOFTENER PO), Take by mouth as needed., Disp: , Rfl:  .  EPINEPHrine (EPIPEN 2-PAK) 0.3 mg/0.3 mL IJ SOAJ injection, Inject 0.3 mg into the muscle once. , Disp: , Rfl:  .  ferrous sulfate (FERROUSUL) 325 (65 FE) MG tablet, Take 1 tablet (325 mg total) by mouth every other day., Disp: 90 tablet, Rfl: 0 .  fesoterodine (TOVIAZ) 8 MG TB24 tablet, Take 1 tablet (8 mg total) by mouth daily., Disp: 30 tablet, Rfl: 11 .  flecainide (TAMBOCOR) 50 MG  tablet, Take 1 tablet (50 mg total) by mouth 2 (two) times daily., Disp: 180 tablet, Rfl: 3 .  gabapentin (NEURONTIN) 100 MG capsule, Take 1 capsule (100 mg total) by mouth 3 (three) times daily., Disp: 270 capsule, Rfl: 4 .  Leuprolide Acetate, 6 Month, (LUPRON) 45 MG injection, Inject 45 mg into the muscle every 6 (six) months., Disp: , Rfl:  .  omeprazole (PRILOSEC) 40 MG capsule, TAKE ONE CAPSULE DAILY., Disp: 90 capsule, Rfl: 4 .  rivaroxaban (XARELTO) 20 MG TABS tablet, Take 1 tablet (20 mg total) by mouth daily., Disp: 30 tablet, Rfl: 11 .  traMADol (ULTRAM) 50 MG tablet, Take 1 tablet (50 mg total) by mouth every 6 (six) hours as needed., Disp: 30 tablet, Rfl: 1 .  zolpidem (AMBIEN) 10 MG tablet, TAKE ONE-HALF TO ONE TABLET AT BEDTIME., Disp: 90 tablet, Rfl: 0  Current Facility-Administered Medications:  .  lidocaine (XYLOCAINE) 2 % jelly 1 application, 1 application, Urethral, Once, Stoioff, Ronda Fairly, MD  Review of Systems  Social History   Tobacco Use  . Smoking status: Former Smoker    Packs/day: 0.00    Years: 12.00    Pack years: 0.00    Types: Cigarettes  . Smokeless tobacco: Current User    Types: Chew  . Tobacco comment: quit over 45 years ago  Substance Use Topics  .  Alcohol use: Yes    Alcohol/week: 5.0 standard drinks    Types: 5 Cans of beer per week      Objective:   There were no vitals taken for this visit. There were no vitals filed for this visit.There is no height or weight on file to calculate BMI.   Physical Exam   No results found for any visits on 10/06/19.     Assessment & Plan        Lelon Huh, MD  [facility]

## 2019-10-10 ENCOUNTER — Ambulatory Visit: Payer: PPO | Admitting: Family Medicine

## 2019-11-16 ENCOUNTER — Other Ambulatory Visit: Payer: Self-pay | Admitting: *Deleted

## 2019-11-16 DIAGNOSIS — C61 Malignant neoplasm of prostate: Secondary | ICD-10-CM

## 2019-11-18 ENCOUNTER — Other Ambulatory Visit: Payer: Self-pay | Admitting: Family Medicine

## 2019-11-20 ENCOUNTER — Other Ambulatory Visit: Payer: PPO

## 2019-11-20 ENCOUNTER — Other Ambulatory Visit: Payer: Self-pay

## 2019-11-20 DIAGNOSIS — C61 Malignant neoplasm of prostate: Secondary | ICD-10-CM

## 2019-11-21 LAB — PSA: Prostate Specific Ag, Serum: <0.1 ng/mL (ref 0.0–4.0)

## 2019-11-27 ENCOUNTER — Other Ambulatory Visit: Payer: Self-pay

## 2019-11-27 ENCOUNTER — Ambulatory Visit (INDEPENDENT_AMBULATORY_CARE_PROVIDER_SITE_OTHER): Payer: PPO

## 2019-11-27 DIAGNOSIS — C61 Malignant neoplasm of prostate: Secondary | ICD-10-CM | POA: Diagnosis not present

## 2019-11-27 MED ORDER — LEUPROLIDE ACETATE (6 MONTH) 45 MG ~~LOC~~ KIT
45.0000 mg | PACK | Freq: Once | SUBCUTANEOUS | Status: AC
  Administered 2019-11-27: 45 mg via SUBCUTANEOUS

## 2019-11-27 NOTE — Progress Notes (Signed)
Eligard SubQ Injection   Due to Prostate Cancer patient is present today for a Eligard Injection.  Medication: Eligard 6 month Dose: 45 mg  Location: left  Lot: [id number] Exp: 08/15/2021  Patient tolerated well, no complications were noted  Performed by: Bradly Bienenstock, CMA

## 2019-12-05 ENCOUNTER — Other Ambulatory Visit: Payer: Self-pay

## 2019-12-05 NOTE — Progress Notes (Signed)
Patient pre screened for office appointment, no questions or concerns today. Patient reminded of upcoming appointment time and date. 

## 2019-12-06 ENCOUNTER — Inpatient Hospital Stay (HOSPITAL_BASED_OUTPATIENT_CLINIC_OR_DEPARTMENT_OTHER): Payer: PPO | Admitting: Internal Medicine

## 2019-12-06 ENCOUNTER — Other Ambulatory Visit: Payer: Self-pay

## 2019-12-06 ENCOUNTER — Encounter: Payer: Self-pay | Admitting: Internal Medicine

## 2019-12-06 ENCOUNTER — Inpatient Hospital Stay: Payer: PPO | Attending: Internal Medicine

## 2019-12-06 DIAGNOSIS — Z85820 Personal history of malignant melanoma of skin: Secondary | ICD-10-CM | POA: Diagnosis not present

## 2019-12-06 DIAGNOSIS — I10 Essential (primary) hypertension: Secondary | ICD-10-CM | POA: Insufficient documentation

## 2019-12-06 DIAGNOSIS — D696 Thrombocytopenia, unspecified: Secondary | ICD-10-CM | POA: Insufficient documentation

## 2019-12-06 DIAGNOSIS — Z8546 Personal history of malignant neoplasm of prostate: Secondary | ICD-10-CM | POA: Insufficient documentation

## 2019-12-06 DIAGNOSIS — C61 Malignant neoplasm of prostate: Secondary | ICD-10-CM | POA: Insufficient documentation

## 2019-12-06 DIAGNOSIS — F1010 Alcohol abuse, uncomplicated: Secondary | ICD-10-CM | POA: Diagnosis not present

## 2019-12-06 DIAGNOSIS — Z7901 Long term (current) use of anticoagulants: Secondary | ICD-10-CM | POA: Diagnosis not present

## 2019-12-06 DIAGNOSIS — E039 Hypothyroidism, unspecified: Secondary | ICD-10-CM | POA: Insufficient documentation

## 2019-12-06 DIAGNOSIS — I4891 Unspecified atrial fibrillation: Secondary | ICD-10-CM | POA: Insufficient documentation

## 2019-12-06 DIAGNOSIS — K7460 Unspecified cirrhosis of liver: Secondary | ICD-10-CM | POA: Diagnosis not present

## 2019-12-06 DIAGNOSIS — Z79899 Other long term (current) drug therapy: Secondary | ICD-10-CM | POA: Diagnosis not present

## 2019-12-06 DIAGNOSIS — Z8673 Personal history of transient ischemic attack (TIA), and cerebral infarction without residual deficits: Secondary | ICD-10-CM | POA: Insufficient documentation

## 2019-12-06 DIAGNOSIS — Z87891 Personal history of nicotine dependence: Secondary | ICD-10-CM | POA: Insufficient documentation

## 2019-12-06 LAB — CBC WITH DIFFERENTIAL/PLATELET
Abs Immature Granulocytes: 0.01 10*3/uL (ref 0.00–0.07)
Basophils Absolute: 0 10*3/uL (ref 0.0–0.1)
Basophils Relative: 0 %
Eosinophils Absolute: 0.1 10*3/uL (ref 0.0–0.5)
Eosinophils Relative: 4 %
HCT: 39.1 % (ref 39.0–52.0)
Hemoglobin: 12.4 g/dL — ABNORMAL LOW (ref 13.0–17.0)
Immature Granulocytes: 0 %
Lymphocytes Relative: 17 %
Lymphs Abs: 0.4 10*3/uL — ABNORMAL LOW (ref 0.7–4.0)
MCH: 26.8 pg (ref 26.0–34.0)
MCHC: 31.7 g/dL (ref 30.0–36.0)
MCV: 84.4 fL (ref 80.0–100.0)
Monocytes Absolute: 0.2 10*3/uL (ref 0.1–1.0)
Monocytes Relative: 7 %
Neutro Abs: 1.7 10*3/uL (ref 1.7–7.7)
Neutrophils Relative %: 72 %
Platelets: 50 10*3/uL — ABNORMAL LOW (ref 150–400)
RBC: 4.63 MIL/uL (ref 4.22–5.81)
RDW: 15.5 % (ref 11.5–15.5)
WBC: 2.3 10*3/uL — ABNORMAL LOW (ref 4.0–10.5)
nRBC: 0 % (ref 0.0–0.2)

## 2019-12-06 LAB — COMPREHENSIVE METABOLIC PANEL
ALT: 19 U/L (ref 0–44)
AST: 30 U/L (ref 15–41)
Albumin: 4.1 g/dL (ref 3.5–5.0)
Alkaline Phosphatase: 66 U/L (ref 38–126)
Anion gap: 8 (ref 5–15)
BUN: 12 mg/dL (ref 8–23)
CO2: 30 mmol/L (ref 22–32)
Calcium: 9 mg/dL (ref 8.9–10.3)
Chloride: 99 mmol/L (ref 98–111)
Creatinine, Ser: 1.02 mg/dL (ref 0.61–1.24)
GFR calc Af Amer: >60 mL/min (ref >60)
GFR calc non Af Amer: >60 mL/min (ref >60)
Glucose, Bld: 211 mg/dL — ABNORMAL HIGH (ref 70–99)
Potassium: 4 mmol/L (ref 3.5–5.1)
Sodium: 137 mmol/L (ref 135–145)
Total Bilirubin: 1 mg/dL (ref 0.3–1.2)
Total Protein: 7.2 g/dL (ref 6.5–8.1)

## 2019-12-06 NOTE — Progress Notes (Signed)
[place] NOTE  Patient Care Team: Birdie Sons, MD as PCP - General (Family Medicine) Laneta Simmers as Physician Assistant (Urology) Isaias Cowman, MD as Consulting Physician (Cardiology) Jannet Mantis, MD (Dermatology)  CHIEF COMPLAINTS/PURPOSE OF CONSULTATION: Thrombocytopenia   HEMATOLOGY HISTORY  Oncology History Overview Note  # THROMBOCYTOPENIA [platelets- ]; WBC- ; Hb-  # Prostate cancer [s/p Prostatectomy 1995]; elevation PSA-Lupron [Sharon McGovern]  # Cirrhosis [id number]; MRI; urology]; A.fib Xarelto [Dr. Paraschos]; colonoscopy 2019 Dr. Vicente Males   Prostate cancer [facility])  06/20/2015 Initial Diagnosis   Prostate cancer ([place])      HISTORY OF PRESENTING ILLNESS:  Ian Sanders 79 y.o.  male pleasant patient with history of alcohol use /cirrhosis /A. fib on Xarelto was been referred to [place] for further evaluation of thrombocytopenia.  Patient denies any nosebleeds or gum bleeding.  Appetite is fair.  No weight loss.  No abdominal pain pain no nausea no vomiting.    Review of Systems  Constitutional: Positive for malaise/fatigue. Negative for chills, diaphoresis, fever and weight loss.  HENT: Negative for nosebleeds and sore throat.   Eyes: Negative for double vision.  Respiratory: Negative for cough, hemoptysis, sputum production, shortness of breath and wheezing.   Cardiovascular: Negative for chest pain, palpitations, orthopnea and leg swelling.  Gastrointestinal: Negative for abdominal pain, blood in stool, constipation, diarrhea, heartburn, melena, nausea and vomiting.       Abdominal distension.   Genitourinary: Negative for dysuria, frequency and urgency.  Musculoskeletal: Positive for back pain and joint pain.  Skin: Negative.  Negative for itching and rash.  Neurological: Negative for dizziness, tingling, focal weakness, weakness and headaches.  Endo/Heme/Allergies: Does not bruise/bleed easily.   Psychiatric/Behavioral: Negative for depression. The patient is not nervous/anxious and does not have insomnia.      MEDICAL HISTORY:  Past Medical History:  Diagnosis Date  . Atrial fibrillation ([place])   . Basal cell carcinoma   . Benign paroxysmal positional vertigo   . Cancer ([place])   . History of chicken pox   . History of measles   . History of mumps   . Hypertension   . Mononeuritis of unspecified site   . Other seborrheic keratosis   . Stroke ([place])   . 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 Unspecified hypothyroidism     SURGICAL HISTORY: Past Surgical History:  Procedure Laterality Date  . Carotid Doppler Ultrasound  04/02/2010   Small amount Calcified plaque bilaterally, no significant stenosis.  . COLONOSCOPY  05/11/2014   Tubular Adenoma. Dr. Allen Norris  . COLONOSCOPY WITH PROPOFOL N/A 05/23/2018   Procedure: COLONOSCOPY WITH PROPOFOL;  Surgeon: [name], MD;  Location: [facility] ENDOSCOPY;  Service: Gastroenterology;  Laterality: N/A;  . DOPPLER ECHOCARDIOGRAPHY  04/02/2010   Mild left atrial dilation. Normal right atrium. No valvular disease. No thrombus. Normal LV function. EF>55%  . ESOPHAGOGASTRODUODENOSCOPY (EGD) WITH PROPOFOL N/A 08/16/2019   Procedure: ESOPHAGOGASTRODUODENOSCOPY (EGD) WITH PROPOFOL;  Surgeon: [name], MD;  Location: [facility] ENDOSCOPY;  Service: Gastroenterology;  Laterality: N/A;  . FINGER AMPUTATION    . HERNIA REPAIR    . MRI Brain with and without contrast  03/20/2010   Suggestive of basal ganglia lacunar infarct  . PROSTATECTOMY  1995   Abdominal  . SKIN SURGERY     multiple  . Thumb surgery      SOCIAL HISTORY: Social History   Socioeconomic History  . Marital status: Married  Spouse name: Not on file  . Number of children: 2  . Years of education: Not on file  . Highest education level: Associate degree: occupational, Hotel manager, or vocational program  Occupational History   . Occupation: Retired  Tobacco Use  . Smoking status: Former Smoker    Packs/day: 0.00    Years: 12.00    Pack years: 0.00    Types: Cigarettes  . Smokeless tobacco: Current User    Types: Chew  . Tobacco comment: quit over 45 years ago  Substance and Sexual Activity  . Alcohol use: Yes    Alcohol/week: 5.0 standard drinks    Types: 5 Cans of beer per week  . Drug use: No  . Sexual activity: Not on file  Other Topics Concern  . 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   Social Determinants of Health   Financial Resource Strain:   . Difficulty of Paying Living Expenses: Not on file  Food Insecurity:   . Worried About Charity fundraiser in the Last Year: Not on file  . Ran Out of Food in the Last Year: Not on file  Transportation Needs:   . Lack of Transportation (Medical): Not on file  . Lack of Transportation (Non-Medical): Not on file  Physical Activity: Inactive  . Days of Exercise per Week: 0 days  . Minutes of Exercise per Session: 0 min  Stress:   . Feeling of Stress : Not on file  Social Connections: Unknown  . Frequency of Communication with Friends and Family: Patient refused  . Frequency of Social Gatherings with Friends and Family: Patient refused  . Attends Religious Services: Patient refused  . Active Member of Clubs or Organizations: Patient refused  . Attends Archivist Meetings: Patient refused  . Marital Status: Patient refused  Intimate Partner Violence: Unknown  . Fear of Current or Ex-Partner: Patient refused  . Emotionally Abused: Patient refused  . Physically Abused: Patient refused  . Sexually Abused: Patient refused    FAMILY HISTORY: Family History  Problem Relation Age of Onset  . Heart disease Mother 55       CABG  . Hypertension Mother   . Heart attack Mother   . Hypertension Brother   . Heart disease Sister         stents  . Prostate cancer Brother   . Kidney cancer Brother   . Skin cancer Brother   . Hypertension Brother   . Bladder Cancer Sister   . Leukemia Father   . Stroke Father        multiple  . Cerebral aneurysm Sister   . Kidney cancer Brother     ALLERGIES:  is allergic to lisinopril; myrbetriq [mirabegron]; atorvastatin; dabigatran etexilate mesylate; and dabigatran etexilate mesylate.  MEDICATIONS:  Current Outpatient Medications  Medication Sig Dispense Refill  . atenolol (TENORMIN) 50 MG tablet Take 1 tablet (50 mg total) by mouth daily. 30 tablet 0  . calcium-vitamin D (OSCAL) 250-125 MG-UNIT per tablet Take 1 tablet by mouth 2 (two) times daily.     . chlorthalidone (HYGROTON) 25 MG tablet Take 1 tablet (25 mg total) by mouth daily. (Patient taking differently: Take 25 mg by mouth every other day. ) 90 tablet 3  . Cyanocobalamin (VITAMIN B-12 PO) Take by mouth daily.    . cyclobenzaprine (FLEXERIL) 10 MG tablet Take 0.5 tablets (5 mg  total) by mouth 2 (two) times daily as needed. 30 tablet 3  . Docusate Calcium (STOOL SOFTENER PO) Take by mouth as needed.    Marland Kitchen EPINEPHrine (EPIPEN 2-PAK) 0.3 mg/0.3 mL IJ SOAJ injection Inject 0.3 mg into the muscle once.     . ferrous sulfate (FERROUSUL) 325 (65 FE) MG tablet Take 1 tablet (325 mg total) by mouth every other day. 90 tablet 0  . fesoterodine (TOVIAZ) 8 MG TB24 tablet Take 1 tablet (8 mg total) by mouth daily. 30 tablet 11  . flecainide (TAMBOCOR) 50 MG tablet Take 1 tablet (50 mg total) by mouth 2 (two) times daily. 180 tablet 3  . gabapentin (NEURONTIN) 100 MG capsule Take 1 capsule (100 mg total) by mouth 3 (three) times daily. 270 capsule 4  . Leuprolide Acetate, 6 Month, (LUPRON) 45 MG injection Inject 45 mg into the muscle every 6 (six) months.    Marland Kitchen omeprazole (PRILOSEC) 40 MG capsule TAKE ONE CAPSULE DAILY. 90 capsule 4  . rivaroxaban (XARELTO) 20 MG TABS tablet Take 1 tablet (20 mg total) by mouth daily. 30 tablet 11  .  traMADol (ULTRAM) 50 MG tablet Take 1 tablet (50 mg total) by mouth every 6 (six) hours as needed. 30 tablet 1  . zolpidem (AMBIEN) 10 MG tablet TAKE ONE-HALF TO ONE TABLET AT BEDTIME. 90 tablet 1   Current Facility-Administered Medications  Medication Dose Route Frequency Provider Last Rate Last Admin  . lidocaine (XYLOCAINE) 2 % jelly 1 application  1 application Urethral Once Stoioff, Scott C, MD         .  PHYSICAL EXAMINATION:   Vitals:   12/06/19 1319  BP: (!) 143/48  Pulse: 63  Temp: (!) 97.3 F (36.3 C)   Filed Weights   12/06/19 1319  Weight: 192 lb 6 oz (87.3 kg)    Physical Exam  Constitutional: He is oriented to person, place, and time and well-developed, well-nourished, and in no distress.  HENT:  Head: Normocephalic and atraumatic.  Mouth/Throat: Oropharynx is clear and moist. No oropharyngeal exudate.  Eyes: Pupils are equal, round, and reactive to light.  Cardiovascular: Normal rate and regular rhythm.  Pulmonary/Chest: No respiratory distress. He has no wheezes.  Abdominal: Soft. Bowel sounds are normal. He exhibits no distension and no mass. There is no abdominal tenderness. There is no rebound and no guarding.  Positive for splenomegaly.  Musculoskeletal:        General: No tenderness or edema. Normal range of motion.     Cervical back: Normal range of motion and neck supple.  Neurological: He is alert and oriented to person, place, and time.  Skin: Skin is warm.  Psychiatric: Affect normal.     LABORATORY DATA:  I have reviewed the data as listed Lab Results  Component Value Date   WBC 2.3 (L) 12/06/2019   HGB 12.4 (L) 12/06/2019   HCT 39.1 12/06/2019   MCV 84.4 12/06/2019   PLT 50 (L) 12/06/2019   Recent Labs    05/16/19 1020 09/13/19 1250 12/06/19 1256  NA 140 139 137  K 3.7 3.4* 4.0  CL 99 102 99  CO2 25 28 30   GLUCOSE 159* 153* 211*  BUN 11 10 12   CREATININE 0.91 1.03 1.02  CALCIUM 8.8 8.7* 9.0  GFRNONAA 80 >60 >60  GFRAA 93  >60 >60  PROT 6.5 6.9 7.2  ALBUMIN 4.5 4.0 4.1  AST 42* 27 30  ALT 21 14 19   ALKPHOS 67 69 66  BILITOT 1.1 0.7 1.0     No results found.  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   #A. fib on Xarelto-again needs to be closely monitored given increased risk of bleeding platelets dropping less than 40- 50,000.  Platelets today 50s.  Stable.  #Cirrhosis-question alcohol versus others-s/p GI evaluation.  #Alcohol abuse again discussed alcohol cessation.  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  Stable.  # # I discussed regarding Covid-19 precautions.  I reviewed the vaccine effectiveness and potential side effects in detail.  Also discussed long-term effectiveness and safety profile are unclear at this time.  I discussed December, 2020 ASCO position statement-that all patients are recommended COVID-19 vaccinations [when available]-as long as they do not have allergy to components of the vaccine.  However, I think the benefits of the vaccination outweigh the potential risks. Re: [id number] vaccination. S/p #1 vaccine-  DISPOSITION: # follow up in 6 months/labs- cbc/cmp- Dr.B  Cc: Dr.Fisher-     All questions were answered. The patient knows to call the clinic with any problems, questions or concerns.  Thank you Dr. for allowing me to participate in the care of your pleasant patient. Please do not hesitate to contact me with questions or concerns in the interim.   Cammie Sickle, MD 12/11/2019 8:03 AM   # 45 minutes face-to-face with the patient discussing the above plan of care; more than 50% of time spent on counseling and coordination.

## 2019-12-06 NOTE — Assessment & Plan Note (Addendum)
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   #A. fib on Xarelto-again needs to be closely monitored given increased risk of bleeding platelets dropping less than 40- 50,000.  Platelets today 50s.  Stable.  #Cirrhosis-question alcohol versus others-s/p GI evaluation.  #Alcohol abuse again discussed alcohol cessation.  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  Stable.  # # I discussed regarding Covid-19 precautions.  I reviewed the vaccine effectiveness and potential side effects in detail.  Also discussed long-term effectiveness and safety profile are unclear at this time.  I discussed December, 2020 ASCO position statement-that all patients are recommended COVID-19 vaccinations [when available]-as long as they do not have allergy to components of the vaccine.  However, I think the benefits of the vaccination outweigh the potential risks. Re: [id number] vaccination. S/p #1 vaccine-  DISPOSITION: # follow up in 6 months/labs- cbc/cmp- Dr.B  Cc: Dr.Fisher-

## 2020-01-17 DIAGNOSIS — Z85828 Personal history of other malignant neoplasm of skin: Secondary | ICD-10-CM | POA: Diagnosis not present

## 2020-01-17 DIAGNOSIS — L578 Other skin changes due to chronic exposure to nonionizing radiation: Secondary | ICD-10-CM | POA: Diagnosis not present

## 2020-01-17 DIAGNOSIS — Z859 Personal history of malignant neoplasm, unspecified: Secondary | ICD-10-CM | POA: Diagnosis not present

## 2020-01-31 DIAGNOSIS — Z8673 Personal history of transient ischemic attack (TIA), and cerebral infarction without residual deficits: Secondary | ICD-10-CM | POA: Diagnosis not present

## 2020-01-31 DIAGNOSIS — I480 Paroxysmal atrial fibrillation: Secondary | ICD-10-CM | POA: Diagnosis not present

## 2020-01-31 DIAGNOSIS — I10 Essential (primary) hypertension: Secondary | ICD-10-CM | POA: Diagnosis not present

## 2020-02-06 ENCOUNTER — Other Ambulatory Visit: Payer: Self-pay | Admitting: Family Medicine

## 2020-02-06 DIAGNOSIS — M7741 Metatarsalgia, right foot: Secondary | ICD-10-CM

## 2020-02-06 DIAGNOSIS — M7742 Metatarsalgia, left foot: Secondary | ICD-10-CM

## 2020-02-06 NOTE — Telephone Encounter (Signed)
LOV 05/09/19 LRF 12/14/18  # 30 x 1

## 2020-02-08 ENCOUNTER — Telehealth: Payer: Self-pay

## 2020-02-08 NOTE — Telephone Encounter (Signed)
We can send in a script for tolterodine 4 mg daily, # 30 with 3 refills.

## 2020-02-08 NOTE — Telephone Encounter (Signed)
Received prior auth for patients TOVIAZ 8mg . Covermymeds Key # [id number]. Noted that patient has tried and failed Myrbetriq due to toungue swelling, noted dry eye with Toviaz. Patients plan states he needs to try oxybutinin or Tolterodine 4mg  or Darifenacin 15mg . Please advise

## 2020-02-09 DIAGNOSIS — I10 Essential (primary) hypertension: Secondary | ICD-10-CM | POA: Diagnosis not present

## 2020-02-09 DIAGNOSIS — I480 Paroxysmal atrial fibrillation: Secondary | ICD-10-CM | POA: Diagnosis not present

## 2020-02-12 MED ORDER — TOLTERODINE TARTRATE ER 4 MG PO CP24: 4.0000 mg | ORAL_CAPSULE | Freq: Every day | ORAL | 11 refills | Status: DC

## 2020-02-12 NOTE — Telephone Encounter (Signed)
New script for tolterodine was sent and patient was notified

## 2020-02-12 NOTE — Telephone Encounter (Signed)
Patient called and left a voice mail message about the prior authorization for his medication.  He uses [facility] in [place].    Please send in the prescription and call patient to advise.

## 2020-02-13 DIAGNOSIS — R002 Palpitations: Secondary | ICD-10-CM | POA: Insufficient documentation

## 2020-02-13 DIAGNOSIS — I10 Essential (primary) hypertension: Secondary | ICD-10-CM | POA: Diagnosis not present

## 2020-02-13 HISTORY — DX: Palpitations: R00.2

## 2020-02-14 ENCOUNTER — Telehealth: Payer: Self-pay | Admitting: Family Medicine

## 2020-02-14 NOTE — Telephone Encounter (Signed)
Patient notified Lisbeth Ply 8mg  approved through 11/15/2020

## 2020-05-07 ENCOUNTER — Other Ambulatory Visit: Payer: Self-pay | Admitting: *Deleted

## 2020-05-07 DIAGNOSIS — R972 Elevated prostate specific antigen [PSA]: Secondary | ICD-10-CM

## 2020-05-09 ENCOUNTER — Encounter: Payer: Self-pay | Admitting: Emergency Medicine

## 2020-05-09 ENCOUNTER — Telehealth: Payer: Self-pay

## 2020-05-09 ENCOUNTER — Other Ambulatory Visit: Payer: Self-pay

## 2020-05-09 ENCOUNTER — Ambulatory Visit: Payer: Self-pay | Admitting: *Deleted

## 2020-05-09 ENCOUNTER — Emergency Department
Admission: EM | Admit: 2020-05-09 | Discharge: 2020-05-09 | Disposition: A | Discharge status: Home / Self Care | Payer: PPO | Attending: Emergency Medicine | Admitting: Emergency Medicine

## 2020-05-09 DIAGNOSIS — S60932A Unspecified superficial injury of left thumb, initial encounter: Secondary | ICD-10-CM | POA: Diagnosis not present

## 2020-05-09 DIAGNOSIS — I10 Essential (primary) hypertension: Secondary | ICD-10-CM | POA: Insufficient documentation

## 2020-05-09 DIAGNOSIS — Z87891 Personal history of nicotine dependence: Secondary | ICD-10-CM | POA: Insufficient documentation

## 2020-05-09 DIAGNOSIS — R001 Bradycardia, unspecified: Secondary | ICD-10-CM | POA: Diagnosis not present

## 2020-05-09 DIAGNOSIS — Y93G1 Activity, food preparation and clean up: Secondary | ICD-10-CM | POA: Diagnosis not present

## 2020-05-09 DIAGNOSIS — Z85828 Personal history of other malignant neoplasm of skin: Secondary | ICD-10-CM | POA: Diagnosis not present

## 2020-05-09 DIAGNOSIS — Y929 Unspecified place or not applicable: Secondary | ICD-10-CM | POA: Diagnosis not present

## 2020-05-09 DIAGNOSIS — Z79899 Other long term (current) drug therapy: Secondary | ICD-10-CM | POA: Diagnosis not present

## 2020-05-09 DIAGNOSIS — R110 Nausea: Secondary | ICD-10-CM | POA: Diagnosis not present

## 2020-05-09 DIAGNOSIS — E1149 Type 2 diabetes mellitus with other diabetic neurological complication: Secondary | ICD-10-CM | POA: Diagnosis not present

## 2020-05-09 DIAGNOSIS — Y999 Unspecified external cause status: Secondary | ICD-10-CM | POA: Diagnosis not present

## 2020-05-09 DIAGNOSIS — S60931A Unspecified superficial injury of right thumb, initial encounter: Secondary | ICD-10-CM | POA: Diagnosis not present

## 2020-05-09 DIAGNOSIS — R42 Dizziness and giddiness: Secondary | ICD-10-CM | POA: Diagnosis not present

## 2020-05-09 DIAGNOSIS — S6990XA Unspecified injury of unspecified wrist, hand and finger(s), initial encounter: Secondary | ICD-10-CM

## 2020-05-09 DIAGNOSIS — E039 Hypothyroidism, unspecified: Secondary | ICD-10-CM | POA: Insufficient documentation

## 2020-05-09 DIAGNOSIS — W268XXA Contact with other sharp object(s), not elsewhere classified, initial encounter: Secondary | ICD-10-CM | POA: Diagnosis not present

## 2020-05-09 DIAGNOSIS — S61012A Laceration without foreign body of left thumb without damage to nail, initial encounter: Secondary | ICD-10-CM | POA: Diagnosis not present

## 2020-05-09 DIAGNOSIS — Z7901 Long term (current) use of anticoagulants: Secondary | ICD-10-CM | POA: Insufficient documentation

## 2020-05-09 LAB — CBC
HCT: 40.6 % (ref 39.0–52.0)
Hemoglobin: 13.6 g/dL (ref 13.0–17.0)
MCH: 27.9 pg (ref 26.0–34.0)
MCHC: 33.5 g/dL (ref 30.0–36.0)
MCV: 83.2 fL (ref 80.0–100.0)
Platelets: 56 10*3/uL — ABNORMAL LOW (ref 150–400)
RBC: 4.88 MIL/uL (ref 4.22–5.81)
RDW: 13.1 % (ref 11.5–15.5)
WBC: 3 10*3/uL — ABNORMAL LOW (ref 4.0–10.5)
nRBC: 0 % (ref 0.0–0.2)

## 2020-05-09 LAB — URINALYSIS, COMPLETE (UACMP) WITH MICROSCOPIC
Bacteria, UA: NONE SEEN
Bilirubin Urine: NEGATIVE
Glucose, UA: NEGATIVE mg/dL
Hgb urine dipstick: NEGATIVE
Ketones, ur: NEGATIVE mg/dL
Leukocytes,Ua: NEGATIVE
Nitrite: NEGATIVE
Protein, ur: NEGATIVE mg/dL
Specific Gravity, Urine: 1.008 (ref 1.005–1.030)
pH: 7 (ref 5.0–8.0)

## 2020-05-09 LAB — BASIC METABOLIC PANEL
Anion gap: 12 (ref 5–15)
BUN: 7 mg/dL — ABNORMAL LOW (ref 8–23)
CO2: 28 mmol/L (ref 22–32)
Calcium: 9 mg/dL (ref 8.9–10.3)
Chloride: 97 mmol/L — ABNORMAL LOW (ref 98–111)
Creatinine, Ser: 0.87 mg/dL (ref 0.61–1.24)
GFR calc Af Amer: >60 mL/min (ref >60)
GFR calc non Af Amer: >60 mL/min (ref >60)
Glucose, Bld: 127 mg/dL — ABNORMAL HIGH (ref 70–99)
Potassium: 4 mmol/L (ref 3.5–5.1)
Sodium: 137 mmol/L (ref 135–145)

## 2020-05-09 LAB — TROPONIN I (HIGH SENSITIVITY)
Troponin I (High Sensitivity): 4 ng/L (ref <18)
Troponin I (High Sensitivity): 3 ng/L (ref <18)

## 2020-05-09 MED ORDER — SODIUM CHLORIDE 0.9% FLUSH: 3.0000 mL | Freq: Once | INTRAVENOUS | Status: DC

## 2020-05-09 NOTE — Telephone Encounter (Signed)
Pt called with complaints of a cut on his ;eft thumb that he thinks is infected; he cut his it on 01/12/32 on a mandolen slicing cucumbers for pickles; he says the cut does not look infected, and is not bledding at this time; he states it did "bleed profusely" initially; the pt states at 0400 05/09/20 he started feeling faint; he says that he feels the same way that he felt when he had blood poisoning from a cut; recommendation made that pt go to the ED for evaluation; he declines but states he will go to urgent care; the pt is seen by Dr Caryn Section, [facility]; will route to office for notification.   Reason for Disposition . Sounds like a serious injury to the triager  Answer Assessment - Initial Assessment Questions 1. MECHANISM: "How did the injury happen?"     *No Answer* 2. ONSET: "When did the injury happen?" (Minutes or hours ago)      05/06/20 3. APPEARANCE of INJURY: "What does the injury look like?"      Gash about 1/2" long 4. SEVERITY: "Can you use the hand normally?" "Can you bend your fingers into a ball and then fully open them?"     yes 5. SIZE: For cuts, bruises, or swelling, ask: "How large is it?" (e.g., inches or centimeters;  entire hand or wrist)     1/2" long 6. PAIN: "Is there pain?" If Yes, ask: "How bad is the pain?"  (Scale 1-10; or mild, moderate, severe)     2 out of 10 7. TETANUS: For any breaks in the skin, ask: "When was the last tetanus booster?"     Not sure of date 8. OTHER SYMPTOMS: "Do you have any other symptoms?"      Feeling faint since 05/09/20 at 0400 9. PREGNANCY: "Is there any chance you are pregnant?" "When was your last menstrual period?"    n/a  Protocols used: HAND AND WRIST INJURY-A-AH

## 2020-05-09 NOTE — Telephone Encounter (Signed)
Copied from [place] [id number]. Topic: General - Other >> May 09, 2020 10:01 AM Leward Quan A wrote: Reason for CRM: Patient called to get an appointment with Dr Caryn Section but nothing today. He states that he has a cut on a thumb that have become infected and need to know what to do. Please call patient at Ph# [phone]

## 2020-05-09 NOTE — ED Notes (Addendum)
Pt reports he cut his thumbs while cutting vegetables on Monday, and was seen at [facility] for that today. He reports that [place] checked his HR and said that given it was 60, he needed to follow up in the ER  Pt a&o x4, NAD at this time

## 2020-05-09 NOTE — Telephone Encounter (Signed)
Dr. Caryn Section is not in the office today. Please schedule appointment with another provider either today or tomorrow.

## 2020-05-09 NOTE — Discharge Instructions (Addendum)
Please seek medical attention for any high fevers, chest pain, shortness of breath, change in behavior, persistent vomiting, bloody stool or any other new or concerning symptoms.  

## 2020-05-09 NOTE — ED Triage Notes (Signed)
Pt presents to ED via POV with c/o dizziness/nausea that started this morning at approx 0400. Pt states awoke this morning with symptoms at approx 0400. Pt states went to bed at approx 12am and was symptoms free. Pt with noted bradycardia in triage, states takes Atenolol for BP. Pt currently A&O x4.   Pt also c/o difficult with his gait due to the dizziness. Pt walks with cane at baseline.

## 2020-05-09 NOTE — ED Provider Notes (Signed)
[facility] Emergency Department Provider Note  ____________________________________________   I have reviewed the triage vital signs and the nursing notes.   HISTORY  Chief Complaint Thumb lacerations  History limited by: Not Limited   HPI Ian Sanders is a 79 y.o. male who presents to the emergency department today sent by urgent care because of concerns for slow heart rate.  Patient went to urgent care today because of bilateral thumb injuries.  The patient states he had bought a new food mandolin and sliced both of his thumbs.  This happened 2 days ago.  Went to urgent care today because of his original bandages were not easily removed from the injured site.  Apparently while at urgent care he was noted to be bradycardic.  The patient however denies any shortness of breath, chest pain.  States he occasionally has vertigo but denies any recent change.  Patient is on atenolol.   Records reviewed. Per medical record review patient has a history of atrial fibrillation.  Past Medical History:  Diagnosis Date  . Atrial fibrillation ([place])   . Basal cell carcinoma   . Benign paroxysmal positional vertigo   . Cancer ([place])   . History of chicken pox   . History of measles   . History of mumps   . Hypertension   . Mononeuritis of unspecified site   . Other seborrheic keratosis   . Stroke ([place])   . 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 Unspecified hypothyroidism     Patient Active Problem List   Diagnosis Date Noted  . Cirrhosis of liver without ascites ([place]) 09/16/2019  . Iron deficiency anemia due to chronic blood loss 09/16/2019  . Chronic anticoagulation 09/16/2019  . History of prostate cancer 09/16/2019  . Thrombocytopenia ([place]) 06/05/2019  . History of adenomatous polyp of colon 05/02/2018  . Paroxysmal atrial fibrillation ([place]) 07/14/2017  . Weight loss 10/30/2016  . Encounter for  anticoagulation discussion and counseling 10/30/2016  . Cornu cutaneum 06/29/2016  . Laceration of ankle 06/29/2016  . LBP (low back pain) 06/29/2016  . Glossal swelling 06/29/2016  . Aortic atherosclerosis ([place]) 05/05/2016  . Fatty liver 05/05/2016  . Depression 04/17/2016  . Regular alcohol consumption 04/17/2016  . Neuropathy 04/16/2016  . Hernia, inguinal, right 04/16/2016  . Melena 04/16/2016  . Elevated liver function tests 04/16/2016  . Tobacco abuse 04/16/2016  . Urge incontinence 07/17/2015  . Incontinence 06/20/2015  . Prostate cancer ([place]) 06/20/2015  . Type 2 diabetes mellitus with neurological complications ([place]) 69/67/8938  . Groin pain 02/21/2013  . 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 HTN (hypertension) 12/14/2011  . Hypothyroidism 05/13/2010  . Sacroiliac sprain 04/28/2010  . History of CVA (cerebrovascular accident) 03/21/2010  . Headache 03/10/2010  . Benign paroxysmal positional vertigo 01/15/2010  . Insomnia 05/09/2009  . Allergic rhinitis 05/09/2009  . Esophageal reflux 05/09/2009  . 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 Carotid Doppler Ultrasound  04/02/2010   Small amount Calcified plaque bilaterally, no significant stenosis.  . COLONOSCOPY  05/11/2014   Tubular Adenoma. Dr. Allen Norris  . COLONOSCOPY WITH PROPOFOL N/A 05/23/2018   Procedure: COLONOSCOPY WITH PROPOFOL;  Surgeon: [name], MD;  Location: [facility] ENDOSCOPY;  Service: Gastroenterology;  Laterality: N/A;  . DOPPLER ECHOCARDIOGRAPHY  04/02/2010   Mild left atrial dilation. Normal right atrium. No valvular disease. No thrombus. Normal LV function. EF>55%  .  ESOPHAGOGASTRODUODENOSCOPY (EGD) WITH PROPOFOL N/A 08/16/2019   Procedure: ESOPHAGOGASTRODUODENOSCOPY (EGD) WITH PROPOFOL;  Surgeon: [name], MD;  Location: [facility] ENDOSCOPY;  Service: Gastroenterology;  Laterality: N/A;  . FINGER AMPUTATION    . HERNIA  REPAIR    . MRI Brain with and without contrast  03/20/2010   Suggestive of basal ganglia lacunar infarct  . PROSTATECTOMY  1995   Abdominal  . SKIN SURGERY     multiple  . Thumb surgery      Prior to Admission medications   Medication Sig Start Date End Date Taking? Authorizing Provider  atenolol (TENORMIN) 50 MG tablet Take 1 tablet (50 mg total) by mouth daily. 08/03/18   Minna Merritts, MD  calcium-vitamin D (OSCAL) 250-125 MG-UNIT per tablet Take 1 tablet by mouth 2 (two) times daily.     [provider]  chlorthalidone (HYGROTON) 25 MG tablet Take 1 tablet (25 mg total) by mouth daily. Patient taking differently: Take 25 mg by mouth every other day.  07/15/17   Minna Merritts, MD  Cyanocobalamin (VITAMIN B-12 PO) Take by mouth daily.    [provider]  cyclobenzaprine (FLEXERIL) 10 MG tablet Take 0.5 tablets (5 mg total) by mouth 2 (two) times daily as needed. 01/17/18   Birdie Sons, MD  Docusate Calcium (STOOL SOFTENER PO) Take by mouth as needed.    [provider]  EPINEPHrine (EPIPEN 2-PAK) 0.3 mg/0.3 mL IJ SOAJ injection Inject 0.3 mg into the muscle once.  02/12/15   [provider]  ferrous sulfate (FERROUSUL) 325 (65 FE) MG tablet Take 1 tablet (325 mg total) by mouth every other day. 09/16/19   Earlie Server, MD  fesoterodine (TOVIAZ) 8 MG TB24 tablet Take 1 tablet (8 mg total) by mouth daily. 09/25/19   McGowan, Hunt Oris, PA-C  flecainide (TAMBOCOR) 50 MG tablet Take 1 tablet (50 mg total) by mouth 2 (two) times daily. 07/15/17   Minna Merritts, MD  gabapentin (NEURONTIN) 100 MG capsule Take 1 capsule (100 mg total) by mouth 3 (three) times daily. 09/26/19   Birdie Sons, MD  Leuprolide Acetate, 6 Month, (LUPRON) 45 MG injection Inject 45 mg into the muscle every 6 (six) months.    [provider]  omeprazole (PRILOSEC) 40 MG capsule TAKE ONE CAPSULE DAILY. Patient taking differently: Take 40 mg by mouth daily.  06/06/18    Birdie Sons, MD  rivaroxaban (XARELTO) 20 MG TABS tablet Take 1 tablet (20 mg total) by mouth daily. 08/21/16   Minna Merritts, MD  tolterodine (DETROL LA) 4 MG 24 hr capsule Take 1 capsule (4 mg total) by mouth daily. 02/12/20   McGowan, Larene [place] A, PA-C  traMADol (ULTRAM) 50 MG tablet Take 1 tablet (50 mg total) by mouth every 6 (six) hours as needed. 02/06/20   Birdie Sons, MD  zolpidem (AMBIEN) 10 MG tablet TAKE ONE-HALF TO ONE TABLET AT BEDTIME. Patient taking differently: Take 5-10 mg by mouth at bedtime as needed.  11/18/19   Birdie Sons, MD    Allergies Lisinopril, Myrbetriq [mirabegron], Atorvastatin, Dabigatran etexilate mesylate, and Dabigatran etexilate mesylate  Family History  Problem Relation Age of Onset  . Heart disease Mother 87       CABG  . Hypertension Mother   . Heart attack Mother   . Hypertension Brother   . Heart disease Sister        stents  . Prostate cancer Brother   . Kidney cancer Brother   .  Skin cancer Brother   . Hypertension Brother   . Bladder Cancer Sister   . Leukemia Father   . Stroke Father        multiple  . Cerebral aneurysm Sister   . Kidney cancer Brother     Social History Social History   Tobacco Use  . Smoking status: Former Smoker    Packs/day: 0.00    Years: 12.00    Pack years: 0.00    Types: Cigarettes  . Smokeless tobacco: Current User    Types: Chew  . Tobacco comment: quit over 45 years ago  Vaping Use  . Vaping Use: Never used  Substance Use Topics  . Alcohol use: Yes    Alcohol/week: 5.0 standard drinks    Types: 5 Cans of beer per week  . Drug use: No    Review of Systems Constitutional: No fever/chills Eyes: No visual changes. ENT: No sore throat. Cardiovascular: Denies chest pain. Respiratory: Denies shortness of breath. Gastrointestinal: No abdominal pain.  No nausea, no vomiting.  No diarrhea.   Genitourinary: Negative for dysuria. Musculoskeletal: Positive for bilateral thumb  injury. Skin: Negative for rash. Neurological: Negative for headaches, focal weakness or numbness.  ____________________________________________   PHYSICAL EXAM:  VITAL SIGNS: ED Triage Vitals  Enc Vitals Group     BP 05/09/20 1351 (!) 152/61     Pulse Rate 05/09/20 1351 (!) 46     Resp 05/09/20 1351 18     Temp 05/09/20 1351 98 F (36.7 C)     Temp Source 05/09/20 1351 Oral     SpO2 05/09/20 1351 98 %     Weight 05/09/20 1351 172 lb (78 kg)     Height 05/09/20 1351 5\' 9"  (1.753 m)     Head Circumference --      Peak Flow --      Pain Score 05/09/20 1359 0   Constitutional: Alert and oriented.  Eyes: Conjunctivae are normal.  ENT      Head: Normocephalic and atraumatic.      Nose: No congestion/rhinnorhea.      Mouth/Throat: Mucous membranes are moist.      Neck: No stridor. Hematological/Lymphatic/Immunilogical: No cervical lymphadenopathy. Cardiovascular: Bradycardic, regular rhythm.  No murmurs, rubs, or gallops.  Respiratory: Normal respiratory effort without tachypnea nor retractions. Breath sounds are clear and equal bilaterally. No wheezes/rales/rhonchi. Gastrointestinal: Soft and non tender. No rebound. No guarding.  Genitourinary: Deferred Musculoskeletal: Normal range of motion in all extremities. No lower extremity edema. Bilateral thumbs in bandages.  Neurologic:  Normal speech and language. No gross focal neurologic deficits are appreciated.  Skin:  Skin is warm, dry and intact. No rash noted. Psychiatric: Mood and affect are normal. Speech and behavior are normal. Patient exhibits appropriate insight and judgment.  ____________________________________________    LABS (pertinent positives/negatives)  Trop hs 3 BMP wnl except cl 97, glu 127, bun 7, cr 0.87 CBC wbc 3.0, hgb 13.6, plt 56  ____________________________________________   EKG  I, Nance Pear, attending physician, personally viewed and interpreted this EKG  EKG Time: 1347 Rate:  47 Rhythm: sinus bradycardia Axis: normal Intervals: qtc 451 QRS: narrow ST changes: no st elevation Impression: abnormal ekg ____________________________________________    RADIOLOGY  None  ____________________________________________   PROCEDURES  Procedures  ____________________________________________   INITIAL IMPRESSION / ASSESSMENT AND PLAN / ED COURSE  Pertinent labs & imaging results that were available during my care of the patient were reviewed by me and considered in my medical decision making (  see chart for details).   Patient presented to the emergency department sent over from urgent care because of concerns for low heart rate.  Patient states he is asymptomatic from his heart rate.  His main concern was for bilateral thumb injuries.  I did offer to take a look at his thumbs however he declined stating that they were just wrapped.  I did suggest that he could potentially stop his atenolol to see if that helped with his heart rate.  Did discuss that he should follow-up with cardiology.  ____________________________________________   FINAL CLINICAL IMPRESSION(S) / ED DIAGNOSES  Final diagnoses:  Thumb injury, unspecified laterality, initial encounter     Note: This dictation was prepared with Dragon dictation. Any transcriptional errors that result from this process are unintentional     Nance Pear, MD 05/09/20 2107

## 2020-05-09 NOTE — Telephone Encounter (Signed)
PA form submitted for Lupron/Eligard.

## 2020-05-09 NOTE — Telephone Encounter (Signed)
Disregard. Phone call was triaged by RN at [facility] and patient advised to go to Urgent care.

## 2020-05-13 ENCOUNTER — Other Ambulatory Visit: Payer: Self-pay

## 2020-05-13 ENCOUNTER — Other Ambulatory Visit: Payer: PPO

## 2020-05-13 DIAGNOSIS — R972 Elevated prostate specific antigen [PSA]: Secondary | ICD-10-CM

## 2020-05-13 NOTE — Progress Notes (Signed)
Subjective:   Ian Sanders is a 79 y.o. male who presents for Medicare Annual/Subsequent preventive examination.  I connected with Beckie Busing today by telephone and verified that I am speaking with the correct person using two identifiers. Location patient: home Location provider: work Persons participating in the virtual visit: patient, provider.   I discussed the limitations, risks, security and privacy concerns of performing an evaluation and management service by telephone and the availability of in person appointments. I also discussed with the patient that there may be a patient responsible charge related to this service. The patient expressed understanding and verbally consented to this telephonic visit.    Interactive audio and video telecommunications were attempted between this provider and patient, however failed, due to patient having technical difficulties OR patient did not have access to video capability.  We continued and completed visit with audio only.  Review of Systems    N/A  Cardiac Risk Factors include: advanced age (>103men, >73 women);diabetes mellitus;hypertension;male gender     Objective:    There were no vitals filed for this visit. There is no height or weight on file to calculate BMI.  Advanced Directives 05/14/2020 05/09/2020 12/05/2019 09/12/2019 08/16/2019 06/05/2019 05/09/2019  Does Patient Have a Medical Advance Directive? Yes Yes Yes Yes Yes Yes Yes  Type of Paramedic of [place];Living will [place];Living will [place];Living will [place];Living will [place];Living will Living will;Healthcare Power of [place];Living will  Does patient want to make changes to medical advance directive? - No - Patient declined No - Patient declined No - Patient declined - - -  Copy of [place] in Chart? Yes -  validated most recent copy scanned in chart (See row information) No - copy requested No - copy requested Yes - validated most recent copy scanned in chart (See row information) - - Yes - validated most recent copy scanned in chart (See row information)  Would patient like information on creating a medical advance directive? - No - Patient declined No - Patient declined - - - -    Current Medications (verified) Outpatient Encounter Medications as of 05/14/2020  Medication Sig  . atenolol (TENORMIN) 50 MG tablet Take 1 tablet (50 mg total) by mouth daily.  . Biotin 10 MG TABS Take 10 mg by mouth daily.  . calcium-vitamin D (OSCAL) 250-125 MG-UNIT per tablet Take 1 tablet by mouth 2 (two) times daily.   . chlorthalidone (HYGROTON) 25 MG tablet Take 1 tablet (25 mg total) by mouth daily. (Patient taking differently: Take 25 mg by mouth every other day. )  . Cyanocobalamin (VITAMIN B-12 PO) Take by mouth daily.  . cyclobenzaprine (FLEXERIL) 10 MG tablet Take 0.5 tablets (5 mg total) by mouth 2 (two) times daily as needed.  Mariane Baumgarten Calcium (STOOL SOFTENER PO) Take by mouth as needed.  Marland Kitchen EPINEPHrine (EPIPEN 2-PAK) 0.3 mg/0.3 mL IJ SOAJ injection Inject 0.3 mg into the muscle once.   . ferrous sulfate (FERROUSUL) 325 (65 FE) MG tablet Take 1 tablet (325 mg total) by mouth every other day.  . fesoterodine (TOVIAZ) 8 MG TB24 tablet Take 1 tablet (8 mg total) by mouth daily.  . flecainide (TAMBOCOR) 50 MG tablet Take 1 tablet (50 mg total) by mouth 2 (two) times daily.  Marland Kitchen gabapentin (NEURONTIN) 100 MG capsule Take 1 capsule (100 mg total) by mouth 3 (three) times daily.  Marland Kitchen  Leuprolide Acetate, 6 Month, (LUPRON) 45 MG injection Inject 45 mg into the muscle every 6 (six) months.  Marland Kitchen omeprazole (PRILOSEC) 40 MG capsule TAKE ONE CAPSULE DAILY. (Patient taking differently: Take 40 mg by mouth daily. )  . rivaroxaban (XARELTO) 20 MG TABS tablet Take 1 tablet (20 mg total) by mouth daily.  Marland Kitchen tolterodine (DETROL  LA) 4 MG 24 hr capsule Take 1 capsule (4 mg total) by mouth daily.  . traMADol (ULTRAM) 50 MG tablet Take 1 tablet (50 mg total) by mouth every 6 (six) hours as needed.  . zolpidem (AMBIEN) 10 MG tablet TAKE ONE-HALF TO ONE TABLET AT BEDTIME. (Patient taking differently: Take 5-10 mg by mouth at bedtime as needed. )   Facility-Administered Encounter Medications as of 05/14/2020  Medication  . lidocaine (XYLOCAINE) 2 % jelly 1 application    Allergies (verified) Lisinopril, Myrbetriq [mirabegron], Atorvastatin, Dabigatran etexilate mesylate, and Dabigatran etexilate mesylate   History: Past Medical History:  Diagnosis Date  . Atrial fibrillation ([place])   . Basal cell carcinoma   . Benign paroxysmal positional vertigo   . Cancer ([place])   . History of chicken pox   . History of measles   . History of mumps   . Hypertension   . Mononeuritis of unspecified site   . Other seborrheic keratosis   . Stroke ([place])   . 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 Unspecified hypothyroidism    Past Surgical History:  Procedure Laterality Date  . Carotid Doppler Ultrasound  04/02/2010   Small amount Calcified plaque bilaterally, no significant stenosis.  . COLONOSCOPY  05/11/2014   Tubular Adenoma. Dr. Allen Norris  . COLONOSCOPY WITH PROPOFOL N/A 05/23/2018   Procedure: COLONOSCOPY WITH PROPOFOL;  Surgeon: [name], MD;  Location: [facility] ENDOSCOPY;  Service: Gastroenterology;  Laterality: N/A;  . DOPPLER ECHOCARDIOGRAPHY  04/02/2010   Mild left atrial dilation. Normal right atrium. No valvular disease. No thrombus. Normal LV function. EF>55%  . ESOPHAGOGASTRODUODENOSCOPY (EGD) WITH PROPOFOL N/A 08/16/2019   Procedure: ESOPHAGOGASTRODUODENOSCOPY (EGD) WITH PROPOFOL;  Surgeon: [name], MD;  Location: [facility] ENDOSCOPY;  Service: Gastroenterology;  Laterality: N/A;  . FINGER AMPUTATION    . HERNIA REPAIR    . MRI Brain with and without contrast   03/20/2010   Suggestive of basal ganglia lacunar infarct  . PROSTATECTOMY  1995   Abdominal  . SKIN SURGERY     multiple  . Thumb surgery     Family History  Problem Relation Age of Onset  . Heart disease Mother 25       CABG  . Hypertension Mother   . Heart attack Mother   . Hypertension Brother   . Heart disease Sister        stents  . Prostate cancer Brother   . Kidney cancer Brother   . Skin cancer Brother   . Hypertension Brother   . Bladder Cancer Sister   . Leukemia Father   . Stroke Father        multiple  . Cerebral aneurysm Sister   . Kidney cancer Brother    Social History   Socioeconomic History  . Marital status: Married    Spouse name: Not on file  . Number of children: 2  . Years of education: Not on file  . Highest education level: Associate degree: occupational, Hotel manager, or vocational program  Occupational History  . Occupation: Retired  Tobacco Use  . Smoking status: Former Smoker  Packs/day: 0.00    Years: 12.00    Pack years: 0.00    Types: Cigarettes  . Smokeless tobacco: Current User    Types: Chew  . Tobacco comment: quit over 45 years ago  Vaping Use  . Vaping Use: Never used  Substance and Sexual Activity  . Alcohol use: Yes    Alcohol/week: 5.0 standard drinks    Types: 5 Cans of beer per week  . Drug use: No  . Sexual activity: Not on file  Other Topics Concern  . 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 Difficulty of Paying Living Expenses: Not hard at all  Food Insecurity: No Food Insecurity  . Worried About Charity fundraiser in the Last Year: Never true  . Ran Out of Food in the Last Year: Never true  Transportation Needs: No Transportation Needs  . Lack of Transportation (Medical): No  . Lack of Transportation (Non-Medical): No    Physical Activity: Inactive  . Days of Exercise per Week: 0 days  . Minutes of Exercise per Session: 0 min  Stress: No Stress Concern Present  . Feeling of Stress : Not at all  Social Connections: Moderately Isolated  . Frequency of Communication with Friends and Family: More than three times a week  . Frequency of Social Gatherings with Friends and Family: More than three times a week  . Attends Religious Services: Never  . Active Member of Clubs or Organizations: No  . Attends Archivist Meetings: Never  . Marital Status: Married    Tobacco Counseling Ready to quit: Not Answered Counseling given: Not Answered Comment: quit over 45 years ago   Clinical Intake:  Pre-visit preparation completed: Yes  Pain : No/denies pain     Nutritional Risks: None Diabetes: Yes  How often do you need to have someone help you when you read instructions, pamphlets, or other written materials from your doctor or pharmacy?: 1 - Never  Diabetic? Yes  Nutrition Risk Assessment:  Has the patient had any N/V/D within the last 2 months?  No  Does the patient have any non-healing wounds?  No  Has the patient had any unintentional weight loss or weight gain?  No   Diabetes:  Is the patient diabetic?  Yes  If diabetic, was a CBG obtained today?  No  Did the patient bring in their glucometer from home?  No  How often do you monitor your CBG's? Randomly ever 2-3 weeks.   Financial Strains and Diabetes Management:  Are you having any financial strains with the device, your supplies or your medication? No .  Does the patient want to be seen by Chronic Care Management for management of their diabetes?  No  Would the patient like to be referred to a Nutritionist or for Diabetic Management?  No   Diabetic Exams:  Diabetic Eye Exam: Completed 06/26/19. Diabetic Foot Exam: Overdue, Pt has been advised about the importance in completing this exam. Note made to follow up on this at next  in office apt.    Interpreter Needed?: No  Information entered by :: [facility], LPN   Activities of Daily Living In your present state of health, do you have any difficulty performing the following activities: 05/14/2020  Hearing? N  Vision? N  Difficulty concentrating  or making decisions? Y  Comment Pt to follow up with PCP regarding memory loss concerns.  Walking or climbing stairs? N  Dressing or bathing? N  Doing errands, shopping? N  Preparing Food and eating ? N  Using the Toilet? N  In the past six months, have you accidently leaked urine? N  Do you have problems with loss of bowel control? N  Managing your Medications? N  Managing your Finances? N  Housekeeping or managing your Housekeeping? N  Some recent data might be hidden    Patient Care Team: Birdie Sons, MD as PCP - General (Family Medicine) Laneta Simmers as Physician Assistant (Urology) Isaias Cowman, MD as Consulting Physician (Cardiology) Jannet Mantis, MD (Dermatology) [place], [place] any recent Medical Services you may have received from other than [facility] providers in the past year (date may be approximate).     Assessment:   This is a routine wellness examination for [place].  Hearing/Vision screen No exam data present  Dietary issues and exercise activities discussed: Current Exercise Habits: The patient does not participate in regular exercise at present, Exercise limited by: neurologic condition(s);orthopedic condition(s)  Goals    . DIET - INCREASE WATER INTAKE     Recommend increasing water intake to 4-6 glasses a day.     . Exercise 3x per week (30 min per time)     Recommend increasing exercise. Pt to work up to walking 3 days a week for 30 minutes.       Depression Screen PHQ 2/9 Scores 05/14/2020 05/09/2019 05/09/2019 05/02/2018 05/02/2018 04/20/2017 04/20/2017  PHQ - 2 Score 0 0 0 1 1 1 1   PHQ- 9 Score - 5 - 6 - 9 -    Fall Risk Fall Risk   05/14/2020 05/09/2019 05/02/2018 04/20/2017 04/17/2016  Falls in the past year? 0 1 Yes No No  Number falls in past yr: 0 1 1 - -  Injury with Fall? 0 0 No - -  Follow up - Falls prevention discussed Falls prevention discussed - -    Any stairs in or around the home? Yes If so, are there any without handrails? No  Home free of loose throw rugs in walkways, pet beds, electrical cords, etc? Yes  Adequate lighting in your home to reduce risk of falls? Yes   ASSISTIVE DEVICES UTILIZED TO PREVENT FALLS:  Life alert? No  Use of a cane, walker or w/c? Yes  Grab bars in the bathroom? Yes  Shower chair or bench in shower? Yes  Elevated toilet seat or a handicapped toilet? No    Cognitive Function: Declined today.      6CIT Screen 05/02/2018 04/20/2017  What Year? 0 points 0 points  What month? 0 points 0 points  What time? 0 points 0 points  Count back from 20 0 points 0 points  Months in reverse 2 points 2 points  Repeat phrase 4 points 0 points  Total Score 6 2    Immunizations Immunization History  Administered Date(s) Administered  . Hep A / Hep B 08/24/2019  . Influenza, High Dose Seasonal PF 08/09/2015, 07/23/2016, 08/26/2018  . Pneumococcal Conjugate-13 12/15/2013  . Pneumococcal Polysaccharide-23 11/16/2006  . Td 05/29/2016    TDAP status: Up to date Flu Vaccine status: Due fall 2021 Pneumococcal vaccine status: Up to date Covid-19 vaccine status: Completed vaccines  Qualifies for Shingles Vaccine? Yes   Zostavax completed No   Shingrix Completed?: No.  Education has been provided regarding the importance of this vaccine. Patient has been advised to call insurance company to determine out of pocket expense if they have not yet received this vaccine. Advised may also receive vaccine at local pharmacy or Health Dept. Verbalized acceptance and understanding.  Screening Tests Health Maintenance  Topic Date Due  . COVID-19 Vaccine (1) Never done  . FOOT EXAM  04/27/2018    . URINE MICROALBUMIN  04/27/2018  . HEMOGLOBIN A1C  11/15/2019  . INFLUENZA VACCINE  06/16/2020  . OPHTHALMOLOGY EXAM  06/25/2020  . COLONOSCOPY  05/24/2023  . TETANUS/TDAP  05/29/2026  . Hepatitis C Screening  Completed  . PNA vac Low Risk Adult  Completed    Health Maintenance  Health Maintenance Due  Topic Date Due  . COVID-19 Vaccine (1) Never done  . FOOT EXAM  04/27/2018  . URINE MICROALBUMIN  04/27/2018  . HEMOGLOBIN A1C  11/15/2019    Colorectal cancer screening: No longer required.   Lung Cancer Screening: (Low Dose CT Chest recommended if Age 81-80 years, 30 pack-year currently smoking OR have quit w/in 15years.) does not qualify.   Additional Screening:  Hepatitis C Screening: Up to date  Vision Screening: Recommended annual ophthalmology exams for early detection of glaucoma and other disorders of the eye. Is the patient up to date with their annual eye exam?  Yes  Who is the provider or what is the name of the office in which the patient attends annual eye exams? [facility] If pt is not established with a provider, would they like to be referred to a provider to establish care? No .   Dental Screening: Recommended annual dental exams for proper oral hygiene  Community Resource Referral / Chronic Care Management: CRR required this visit?  No   CCM required this visit?  No      Plan:     I have personally reviewed and noted the following in the patient's chart:   . Medical and social history . Use of alcohol, tobacco or illicit drugs  . Current medications and supplements . Functional ability and status . Nutritional status . Physical activity . Advanced directives . List of other physicians . Hospitalizations, surgeries, and ER visits in previous 12 months . Vitals . Screenings to include cognitive, depression, and falls . Referrals and appointments  In addition, I have reviewed and discussed with patient certain preventive protocols,  quality metrics, and best practice recommendations. A written personalized care plan for preventive services as well as general preventive health recommendations were provided to patient.     Marquavious Nazar [place], [place]   9/74/1638   Nurse Notes: Pt needs a diabetic foot exam, Hgb A1c check and a urine check at next in office apt. Advised pt to bring in Covid vaccine information to up date chart.

## 2020-05-14 ENCOUNTER — Ambulatory Visit (INDEPENDENT_AMBULATORY_CARE_PROVIDER_SITE_OTHER): Payer: PPO

## 2020-05-14 DIAGNOSIS — Z0000 Encounter for general adult medical examination without abnormal findings: Secondary | ICD-10-CM | POA: Diagnosis not present

## 2020-05-14 LAB — PSA: Prostate Specific Ag, Serum: <0.1 ng/mL (ref 0.0–4.0)

## 2020-05-14 NOTE — Patient Instructions (Addendum)
Mr. Ian Sanders , Thank you for taking time to come for your Medicare Wellness Visit. I appreciate your ongoing commitment to your health goals. Please review the following plan we discussed and let me know if I can assist you in the future.   Screening recommendations/referrals: Colonoscopy: No longer required.  Recommended yearly ophthalmology/optometry visit for glaucoma screening and checkup Recommended yearly dental visit for hygiene and checkup  Vaccinations: Influenza vaccine: Due fall 2021 Pneumococcal vaccine: Completed series Tdap vaccine: Up to date, due 05/2026 Shingles vaccine: Currently due, declined at this time.     Advanced directives: Currently on file.  Conditions/risks identified: Continue to increase water intake to 6-8 8 oz glasses a day.  Next appointment: 08/19/20 @ 2:00 PM with Dr Caryn Section. Declined scheduling an AWV for 2022 at this time.  Preventive Care 20 Years and Older, Male Preventive care refers to lifestyle choices and visits with your health care provider that can promote health and wellness. What does preventive care include?  A yearly physical exam. This is also called an annual well check.  Dental exams once or twice a year.  Routine eye exams. Ask your health care provider how often you should have your eyes checked.  Personal lifestyle choices, including:  Daily care of your teeth and gums.  Regular physical activity.  Eating a healthy diet.  Avoiding tobacco and drug use.  Limiting alcohol use.  Practicing safe sex.  Taking low doses of aspirin every day.  Taking vitamin and mineral supplements as recommended by your health care provider. What happens during an annual well check? The services and screenings done by your health care provider during your annual well check will depend on your age, overall health, lifestyle risk factors, and family history of disease. Counseling  Your health care provider may ask you questions about  your:  Alcohol use.  Tobacco use.  Drug use.  Emotional well-being.  Home and relationship well-being.  Sexual activity.  Eating habits.  History of falls.  Memory and ability to understand (cognition).  Work and work Statistician. Screening  You may have the following tests or measurements:  Height, weight, and BMI.  Blood pressure.  Lipid and cholesterol levels. These may be checked every 5 years, or more frequently if you are over 64 years old.  Skin check.  Lung cancer screening. You may have this screening every year starting at age 51 if you have a 30-pack-year history of smoking and currently smoke or have quit within the past 15 years.  Fecal occult blood test (FOBT) of the stool. You may have this test every year starting at age 78.  Flexible sigmoidoscopy or colonoscopy. You may have a sigmoidoscopy every 5 years or a colonoscopy every 10 years starting at age 59.  Prostate cancer screening. Recommendations will vary depending on your family history and other risks.  Hepatitis C blood test.  Hepatitis B blood test.  Sexually transmitted disease (STD) testing.  Diabetes screening. This is done by checking your blood sugar (glucose) after you have not eaten for a while (fasting). You may have this done every 1-3 years.  Abdominal aortic aneurysm (AAA) screening. You may need this if you are a current or former smoker.  Osteoporosis. You may be screened starting at age 56 if you are at high risk. Talk with your health care provider about your test results, treatment options, and if necessary, the need for more tests. Vaccines  Your health care provider may recommend certain vaccines, such as:  Influenza vaccine. This is recommended every year.  Tetanus, diphtheria, and acellular pertussis (Tdap, Td) vaccine. You may need a Td booster every 10 years.  Zoster vaccine. You may need this after age 24.  Pneumococcal 13-valent conjugate (PCV13) vaccine.  One dose is recommended after age 9.  Pneumococcal polysaccharide (PPSV23) vaccine. One dose is recommended after age 72. Talk to your health care provider about which screenings and vaccines you need and how often you need them. This information is not intended to replace advice given to you by your health care provider. Make sure you discuss any questions you have with your health care provider. Document Released: 11/29/2015 Document Revised: 07/22/2016 Document Reviewed: 09/03/2015 Elsevier Interactive Patient Education  2017 [place] Prevention in the Home Falls can cause injuries. They can happen to people of all ages. There are many things you can do to make your home safe and to help prevent falls. What can I do on the outside of my home?  Regularly fix the edges of walkways and driveways and fix any cracks.  Remove anything that might make you trip as you walk through a door, such as a raised step or threshold.  Trim any bushes or trees on the path to your home.  Use bright outdoor lighting.  Clear any walking paths of anything that might make someone trip, such as rocks or tools.  Regularly check to see if handrails are loose or broken. Make sure that both sides of any steps have handrails.  Any raised decks and porches should have guardrails on the edges.  Have any leaves, snow, or ice cleared regularly.  Use sand or salt on walking paths during winter.  Clean up any spills in your garage right away. This includes oil or grease spills. What can I do in the bathroom?  Use night lights.  Install grab bars by the toilet and in the tub and shower. Do not use towel bars as grab bars.  Use non-skid mats or decals in the tub or shower.  If you need to sit down in the shower, use a plastic, non-slip stool.  Keep the floor dry. Clean up any water that spills on the floor as soon as it happens.  Remove soap buildup in the tub or shower regularly.  Attach bath  mats securely with double-sided non-slip rug tape.  Do not have throw rugs and other things on the floor that can make you trip. What can I do in the bedroom?  Use night lights.  Make sure that you have a light by your bed that is easy to reach.  Do not use any sheets or blankets that are too big for your bed. They should not hang down onto the floor.  Have a firm chair that has side arms. You can use this for support while you get dressed.  Do not have throw rugs and other things on the floor that can make you trip. What can I do in the kitchen?  Clean up any spills right away.  Avoid walking on wet floors.  Keep items that you use a lot in easy-to-reach places.  If you need to reach something above you, use a strong step stool that has a grab bar.  Keep electrical cords out of the way.  Do not use floor polish or wax that makes floors slippery. If you must use wax, use non-skid floor wax.  Do not have throw rugs and other things on the floor that  can make you trip. What can I do with my stairs?  Do not leave any items on the stairs.  Make sure that there are handrails on both sides of the stairs and use them. Fix handrails that are broken or loose. Make sure that handrails are as long as the stairways.  Check any carpeting to make sure that it is firmly attached to the stairs. Fix any carpet that is loose or worn.  Avoid having throw rugs at the top or bottom of the stairs. If you do have throw rugs, attach them to the floor with carpet tape.  Make sure that you have a light switch at the top of the stairs and the bottom of the stairs. If you do not have them, ask someone to add them for you. What else can I do to help prevent falls?  Wear shoes that:  Do not have high heels.  Have rubber bottoms.  Are comfortable and fit you well.  Are closed at the toe. Do not wear sandals.  If you use a stepladder:  Make sure that it is fully opened. Do not climb a closed  stepladder.  Make sure that both sides of the stepladder are locked into place.  Ask someone to hold it for you, if possible.  Clearly mark and make sure that you can see:  Any grab bars or handrails.  First and last steps.  Where the edge of each step is.  Use tools that help you move around (mobility aids) if they are needed. These include:  Canes.  Walkers.  Scooters.  Crutches.  Turn on the lights when you go into a dark area. Replace any light bulbs as soon as they burn out.  Set up your furniture so you have a clear path. Avoid moving your furniture around.  If any of your floors are uneven, fix them.  If there are any pets around you, be aware of where they are.  Review your medicines with your doctor. Some medicines can make you feel dizzy. This can increase your chance of falling. Ask your doctor what other things that you can do to help prevent falls. This information is not intended to replace advice given to you by your health care provider. Make sure you discuss any questions you have with your health care provider. Document Released: 08/29/2009 Document Revised: 04/09/2016 Document Reviewed: 12/07/2014 Elsevier Interactive Patient Education  2017 [employer].

## 2020-05-15 NOTE — Telephone Encounter (Signed)
Incoming confirmation from [place] Lupron/Eligard does not require PA. Case ID: [id number]

## 2020-05-17 ENCOUNTER — Other Ambulatory Visit: Payer: Self-pay | Admitting: Family Medicine

## 2020-05-27 ENCOUNTER — Other Ambulatory Visit: Payer: Self-pay

## 2020-05-27 ENCOUNTER — Ambulatory Visit (INDEPENDENT_AMBULATORY_CARE_PROVIDER_SITE_OTHER): Payer: PPO

## 2020-05-27 DIAGNOSIS — C61 Malignant neoplasm of prostate: Secondary | ICD-10-CM

## 2020-05-27 MED ORDER — LEUPROLIDE ACETATE (6 MONTH) 45 MG ~~LOC~~ KIT
45.0000 mg | PACK | Freq: Once | SUBCUTANEOUS | Status: AC
  Administered 2020-05-27: 45 mg via SUBCUTANEOUS

## 2020-05-27 NOTE — Progress Notes (Signed)
Eligard SubQ Injection   Due to Prostate Cancer patient is present today for a Eligard Injection.  Medication: Eligard 6 month Dose: 45 mg  Location: left  Lot: [id number] Exp: 12/15/2021  Patient tolerated well, no complications were noted  Performed by: Kerman Passey  Per Dr. Bernardo Heater patient is to continue therapy for 6 months . Patient's next follow up was scheduled for 11/2020. This appointment was scheduled using wheel and given to patient today along with reminder continue on Vitamin D 800-1000iu and Calium 1000-1200mg  daily while on Androgen Deprivation Therapy.

## 2020-05-27 NOTE — Patient Instructions (Signed)
reminder continue on Vitamin D 800-1000iu and Calium 1000-1200mg daily while on Androgen Deprivation Therapy. 

## 2020-05-30 DIAGNOSIS — I10 Essential (primary) hypertension: Secondary | ICD-10-CM | POA: Diagnosis not present

## 2020-05-30 DIAGNOSIS — R001 Bradycardia, unspecified: Secondary | ICD-10-CM | POA: Diagnosis not present

## 2020-05-30 DIAGNOSIS — I480 Paroxysmal atrial fibrillation: Secondary | ICD-10-CM | POA: Diagnosis not present

## 2020-06-05 ENCOUNTER — Inpatient Hospital Stay: Payer: PPO

## 2020-06-05 ENCOUNTER — Inpatient Hospital Stay: Payer: PPO | Admitting: Internal Medicine

## 2020-06-10 DIAGNOSIS — I480 Paroxysmal atrial fibrillation: Secondary | ICD-10-CM | POA: Diagnosis not present

## 2020-06-11 DIAGNOSIS — R001 Bradycardia, unspecified: Secondary | ICD-10-CM | POA: Diagnosis not present

## 2020-06-11 DIAGNOSIS — I480 Paroxysmal atrial fibrillation: Secondary | ICD-10-CM | POA: Diagnosis not present

## 2020-06-11 DIAGNOSIS — I10 Essential (primary) hypertension: Secondary | ICD-10-CM | POA: Diagnosis not present

## 2020-06-16 DIAGNOSIS — I639 Cerebral infarction, unspecified: Secondary | ICD-10-CM

## 2020-06-16 HISTORY — DX: Cerebral infarction, unspecified: I63.9

## 2020-06-25 ENCOUNTER — Encounter: Payer: Self-pay | Admitting: Emergency Medicine

## 2020-06-25 ENCOUNTER — Other Ambulatory Visit: Payer: Self-pay

## 2020-06-25 ENCOUNTER — Emergency Department: Payer: PPO

## 2020-06-25 ENCOUNTER — Observation Stay
Admission: EM | Admit: 2020-06-25 | Discharge: 2020-06-27 | Disposition: A | Discharge status: Home / Self Care | Payer: PPO | Attending: Internal Medicine | Admitting: Internal Medicine

## 2020-06-25 DIAGNOSIS — I480 Paroxysmal atrial fibrillation: Secondary | ICD-10-CM | POA: Diagnosis present

## 2020-06-25 DIAGNOSIS — Z20822 Contact with and (suspected) exposure to covid-19: Secondary | ICD-10-CM | POA: Insufficient documentation

## 2020-06-25 DIAGNOSIS — E039 Hypothyroidism, unspecified: Secondary | ICD-10-CM | POA: Insufficient documentation

## 2020-06-25 DIAGNOSIS — Y9389 Activity, other specified: Secondary | ICD-10-CM | POA: Insufficient documentation

## 2020-06-25 DIAGNOSIS — W108XXA Fall (on) (from) other stairs and steps, initial encounter: Secondary | ICD-10-CM | POA: Diagnosis not present

## 2020-06-25 DIAGNOSIS — E1149 Type 2 diabetes mellitus with other diabetic neurological complication: Secondary | ICD-10-CM | POA: Insufficient documentation

## 2020-06-25 DIAGNOSIS — R7303 Prediabetes: Secondary | ICD-10-CM | POA: Diagnosis present

## 2020-06-25 DIAGNOSIS — S065X0A Traumatic subdural hemorrhage without loss of consciousness, initial encounter: Secondary | ICD-10-CM

## 2020-06-25 DIAGNOSIS — I6201 Nontraumatic acute subdural hemorrhage: Secondary | ICD-10-CM | POA: Diagnosis not present

## 2020-06-25 DIAGNOSIS — S065XAA Traumatic subdural hemorrhage with loss of consciousness status unknown, initial encounter: Secondary | ICD-10-CM

## 2020-06-25 DIAGNOSIS — Z9181 History of falling: Secondary | ICD-10-CM | POA: Diagnosis not present

## 2020-06-25 DIAGNOSIS — I6200 Nontraumatic subdural hemorrhage, unspecified: Secondary | ICD-10-CM | POA: Diagnosis not present

## 2020-06-25 DIAGNOSIS — R262 Difficulty in walking, not elsewhere classified: Secondary | ICD-10-CM | POA: Diagnosis not present

## 2020-06-25 DIAGNOSIS — I10 Essential (primary) hypertension: Secondary | ICD-10-CM | POA: Diagnosis present

## 2020-06-25 DIAGNOSIS — M5030 Other cervical disc degeneration, unspecified cervical region: Secondary | ICD-10-CM | POA: Diagnosis not present

## 2020-06-25 DIAGNOSIS — I7090 Unspecified atherosclerosis: Secondary | ICD-10-CM | POA: Diagnosis not present

## 2020-06-25 DIAGNOSIS — K7030 Alcoholic cirrhosis of liver without ascites: Secondary | ICD-10-CM | POA: Insufficient documentation

## 2020-06-25 DIAGNOSIS — K7460 Unspecified cirrhosis of liver: Secondary | ICD-10-CM | POA: Diagnosis present

## 2020-06-25 DIAGNOSIS — M4312 Spondylolisthesis, cervical region: Secondary | ICD-10-CM | POA: Diagnosis not present

## 2020-06-25 DIAGNOSIS — Y999 Unspecified external cause status: Secondary | ICD-10-CM | POA: Insufficient documentation

## 2020-06-25 DIAGNOSIS — Z87891 Personal history of nicotine dependence: Secondary | ICD-10-CM | POA: Insufficient documentation

## 2020-06-25 DIAGNOSIS — Z7901 Long term (current) use of anticoagulants: Secondary | ICD-10-CM | POA: Diagnosis not present

## 2020-06-25 DIAGNOSIS — C801 Malignant (primary) neoplasm, unspecified: Secondary | ICD-10-CM | POA: Insufficient documentation

## 2020-06-25 DIAGNOSIS — R2689 Other abnormalities of gait and mobility: Secondary | ICD-10-CM | POA: Diagnosis not present

## 2020-06-25 DIAGNOSIS — I6782 Cerebral ischemia: Secondary | ICD-10-CM | POA: Diagnosis not present

## 2020-06-25 DIAGNOSIS — Z79899 Other long term (current) drug therapy: Secondary | ICD-10-CM | POA: Diagnosis not present

## 2020-06-25 DIAGNOSIS — S065X9A Traumatic subdural hemorrhage with loss of consciousness of unspecified duration, initial encounter: Secondary | ICD-10-CM

## 2020-06-25 DIAGNOSIS — Y9289 Other specified places as the place of occurrence of the external cause: Secondary | ICD-10-CM | POA: Insufficient documentation

## 2020-06-25 DIAGNOSIS — S199XXA Unspecified injury of neck, initial encounter: Secondary | ICD-10-CM | POA: Diagnosis not present

## 2020-06-25 DIAGNOSIS — S0990XA Unspecified injury of head, initial encounter: Secondary | ICD-10-CM | POA: Diagnosis present

## 2020-06-25 DIAGNOSIS — D693 Immune thrombocytopenic purpura: Secondary | ICD-10-CM

## 2020-06-25 DIAGNOSIS — I6529 Occlusion and stenosis of unspecified carotid artery: Secondary | ICD-10-CM | POA: Diagnosis not present

## 2020-06-25 HISTORY — DX: Traumatic subdural hemorrhage without loss of consciousness, initial encounter: S06.5X0A

## 2020-06-25 LAB — CBC WITH DIFFERENTIAL/PLATELET
Abs Immature Granulocytes: 0.01 10*3/uL (ref 0.00–0.07)
Basophils Absolute: 0 10*3/uL (ref 0.0–0.1)
Basophils Relative: 1 %
Eosinophils Absolute: 0.1 10*3/uL (ref 0.0–0.5)
Eosinophils Relative: 2 %
HCT: 40 % (ref 39.0–52.0)
Hemoglobin: 13.1 g/dL (ref 13.0–17.0)
Immature Granulocytes: 0 %
Lymphocytes Relative: 16 %
Lymphs Abs: 0.4 10*3/uL — ABNORMAL LOW (ref 0.7–4.0)
MCH: 27.3 pg (ref 26.0–34.0)
MCHC: 32.8 g/dL (ref 30.0–36.0)
MCV: 83.3 fL (ref 80.0–100.0)
Monocytes Absolute: 0.2 10*3/uL (ref 0.1–1.0)
Monocytes Relative: 7 %
Neutro Abs: 1.8 10*3/uL (ref 1.7–7.7)
Neutrophils Relative %: 74 %
Platelets: 56 10*3/uL — ABNORMAL LOW (ref 150–400)
RBC: 4.8 MIL/uL (ref 4.22–5.81)
RDW: 13.2 % (ref 11.5–15.5)
WBC: 2.4 10*3/uL — ABNORMAL LOW (ref 4.0–10.5)
nRBC: 0 % (ref 0.0–0.2)

## 2020-06-25 LAB — COMPREHENSIVE METABOLIC PANEL
ALT: 17 U/L (ref 0–44)
AST: 30 U/L (ref 15–41)
Albumin: 4.6 g/dL (ref 3.5–5.0)
Alkaline Phosphatase: 86 U/L (ref 38–126)
Anion gap: 15 (ref 5–15)
BUN: 7 mg/dL — ABNORMAL LOW (ref 8–23)
CO2: 26 mmol/L (ref 22–32)
Calcium: 9.4 mg/dL (ref 8.9–10.3)
Chloride: 95 mmol/L — ABNORMAL LOW (ref 98–111)
Creatinine, Ser: 0.77 mg/dL (ref 0.61–1.24)
GFR calc Af Amer: >60 mL/min (ref >60)
GFR calc non Af Amer: >60 mL/min (ref >60)
Glucose, Bld: 120 mg/dL — ABNORMAL HIGH (ref 70–99)
Potassium: 3.5 mmol/L (ref 3.5–5.1)
Sodium: 136 mmol/L (ref 135–145)
Total Bilirubin: 1.6 mg/dL — ABNORMAL HIGH (ref 0.3–1.2)
Total Protein: 7.6 g/dL (ref 6.5–8.1)

## 2020-06-25 LAB — PROTIME-INR
INR: 1.8 — ABNORMAL HIGH (ref 0.8–1.2)
Prothrombin Time: 20.3 seconds — ABNORMAL HIGH (ref 11.4–15.2)

## 2020-06-25 MED ORDER — METOCLOPRAMIDE HCL 5 MG/ML IJ SOLN
5.0000 mg | Freq: Once | INTRAMUSCULAR | Status: DC
  Filled 2020-06-25: qty 2

## 2020-06-25 MED ORDER — ACETAMINOPHEN 500 MG PO TABS
1000.0000 mg | ORAL_TABLET | Freq: Once | ORAL | Status: AC
  Administered 2020-06-25: 1000 mg via ORAL
  Filled 2020-06-25: qty 2

## 2020-06-25 MED ORDER — METOCLOPRAMIDE HCL 10 MG PO TABS
10.0000 mg | ORAL_TABLET | Freq: Once | ORAL | Status: AC
  Administered 2020-06-25: 10 mg via ORAL

## 2020-06-25 MED ORDER — ACETAMINOPHEN 325 MG PO TABS: 650.0000 mg | ORAL_TABLET | Freq: Four times a day (QID) | ORAL | Status: DC | PRN

## 2020-06-25 MED ORDER — ACETAMINOPHEN 650 MG RE SUPP: 650.0000 mg | Freq: Four times a day (QID) | RECTAL | Status: DC | PRN

## 2020-06-25 MED ORDER — ONDANSETRON HCL 4 MG PO TABS: 4.0000 mg | ORAL_TABLET | Freq: Four times a day (QID) | ORAL | Status: DC | PRN

## 2020-06-25 MED ORDER — ONDANSETRON HCL 4 MG/2ML IJ SOLN: 4.0000 mg | Freq: Four times a day (QID) | INTRAMUSCULAR | Status: DC | PRN

## 2020-06-25 NOTE — ED Provider Notes (Addendum)
[facility] Emergency Department Provider Note    First MD Initiated Contact with Patient 06/25/20 1422     (approximate)  I have reviewed the triage vital signs and the nursing notes.   HISTORY  Chief Complaint Fall and Headache    HPI Ian Sanders is a 79 y.o. male the history of A. fib on Xarelto presents to the ER for evaluation of headache and some blurry vision and unsteadiness after mechanical fall that occurred a little less than 1 week ago.  States that the time he was stepping down some stairs and lost his footing falling hitting his head on a plastic lawn chair.  Did have a small cut to the left forehead that was bleeding at that time but has since stopped.  Presents the ER today because of persistent symptoms.  In triage she had CT imaging ordered which shows evidence of small bilateral subdural hematoma.  Did take Xarelto today.    Past Medical History:  Diagnosis Date  . Atrial fibrillation ([place])   . Basal cell carcinoma   . Benign paroxysmal positional vertigo   . Cancer ([place])   . History of chicken pox   . History of measles   . History of mumps   . Hypertension   . Mononeuritis of unspecified site   . Other seborrheic keratosis   . Stroke ([place])   . 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 Unspecified hypothyroidism    Family History  Problem Relation Age of Onset  . Heart disease Mother 76       CABG  . Hypertension Mother   . Heart attack Mother   . Hypertension Brother   . Heart disease Sister        stents  . Prostate cancer Brother   . Kidney cancer Brother   . Skin cancer Brother   . Hypertension Brother   . Bladder Cancer Sister   . Leukemia Father   . Stroke Father        multiple  . Cerebral aneurysm Sister   . Kidney cancer Brother    Past Surgical History:  Procedure Laterality Date  . Carotid Doppler Ultrasound  04/02/2010   Small amount Calcified  plaque bilaterally, no significant stenosis.  . COLONOSCOPY  05/11/2014   Tubular Adenoma. Dr. Allen Norris  . COLONOSCOPY WITH PROPOFOL N/A 05/23/2018   Procedure: COLONOSCOPY WITH PROPOFOL;  Surgeon: [name], MD;  Location: [facility] ENDOSCOPY;  Service: Gastroenterology;  Laterality: N/A;  . DOPPLER ECHOCARDIOGRAPHY  04/02/2010   Mild left atrial dilation. Normal right atrium. No valvular disease. No thrombus. Normal LV function. EF>55%  . ESOPHAGOGASTRODUODENOSCOPY (EGD) WITH PROPOFOL N/A 08/16/2019   Procedure: ESOPHAGOGASTRODUODENOSCOPY (EGD) WITH PROPOFOL;  Surgeon: [name], MD;  Location: [facility] ENDOSCOPY;  Service: Gastroenterology;  Laterality: N/A;  . FINGER AMPUTATION    . HERNIA REPAIR    . MRI Brain with and without contrast  03/20/2010   Suggestive of basal ganglia lacunar infarct  . PROSTATECTOMY  1995   Abdominal  . SKIN SURGERY     multiple  . Thumb surgery     Patient Active Problem List   Diagnosis Date Noted  . Cirrhosis of liver without ascites ([place]) 09/16/2019  . Iron deficiency anemia due to chronic blood loss 09/16/2019  . Chronic anticoagulation 09/16/2019  . History of prostate cancer 09/16/2019  . Thrombocytopenia ([place]) 06/05/2019  . History of adenomatous polyp of colon 05/02/2018  . Paroxysmal  atrial fibrillation ([place]) 07/14/2017  . Weight loss 10/30/2016  . Encounter for anticoagulation discussion and counseling 10/30/2016  . Cornu cutaneum 06/29/2016  . Laceration of ankle 06/29/2016  . LBP (low back pain) 06/29/2016  . Glossal swelling 06/29/2016  . Aortic atherosclerosis ([place]) 05/05/2016  . Fatty liver 05/05/2016  . Depression 04/17/2016  . Regular alcohol consumption 04/17/2016  . Neuropathy 04/16/2016  . Hernia, inguinal, right 04/16/2016  . Melena 04/16/2016  . Elevated liver function tests 04/16/2016  . Tobacco abuse 04/16/2016  . Urge incontinence 07/17/2015  . Incontinence 06/20/2015  . Prostate cancer ([place]) 06/20/2015  . Type 2 diabetes  mellitus with neurological complications ([place]) 21/30/8657  . Groin pain 02/21/2013  . 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 HTN (hypertension) 12/14/2011  . Hypothyroidism 05/13/2010  . Sacroiliac sprain 04/28/2010  . History of CVA (cerebrovascular accident) 03/21/2010  . Headache 03/10/2010  . Benign paroxysmal positional vertigo 01/15/2010  . Insomnia 05/09/2009  . Allergic rhinitis 05/09/2009  . Esophageal reflux 05/09/2009  . 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/2009      Prior to Admission medications   Medication Sig Start Date End Date Taking? Authorizing Provider  atenolol (TENORMIN) 50 MG tablet Take 1 tablet (50 mg total) by mouth daily. 08/03/18   Minna Merritts, MD  Biotin 10 MG TABS Take 10 mg by mouth daily.    [provider]  calcium-vitamin D (OSCAL) 250-125 MG-UNIT per tablet Take 1 tablet by mouth 2 (two) times daily.     [provider]  chlorthalidone (HYGROTON) 25 MG tablet Take 1 tablet (25 mg total) by mouth daily. Patient taking differently: Take 25 mg by mouth every other day.  07/15/17   Minna Merritts, MD  Cyanocobalamin (VITAMIN B-12 PO) Take by mouth daily.    [provider]  cyclobenzaprine (FLEXERIL) 10 MG tablet Take 0.5 tablets (5 mg total) by mouth 2 (two) times daily as needed. 01/17/18   Birdie Sons, MD  Docusate Calcium (STOOL SOFTENER PO) Take by mouth as needed.    [provider]  EPINEPHrine (EPIPEN 2-PAK) 0.3 mg/0.3 mL IJ SOAJ injection Inject 0.3 mg into the muscle once.  02/12/15   [provider]  ferrous sulfate (FERROUSUL) 325 (65 FE) MG tablet Take 1 tablet (325 mg total) by mouth every other day. 09/16/19   Earlie Server, MD  fesoterodine (TOVIAZ) 8 MG TB24 tablet Take 1 tablet (8 mg total) by mouth daily. 09/25/19   McGowan, Hunt Oris, PA-C  flecainide (TAMBOCOR) 50 MG tablet Take 1 tablet (50 mg total) by mouth 2 (two) times daily. 07/15/17   Minna Merritts, MD   gabapentin (NEURONTIN) 100 MG capsule Take 1 capsule (100 mg total) by mouth 3 (three) times daily. 09/26/19   Birdie Sons, MD  Leuprolide Acetate, 6 Month, (LUPRON) 45 MG injection Inject 45 mg into the muscle every 6 (six) months.    [provider]  omeprazole (PRILOSEC) 40 MG capsule TAKE ONE CAPSULE DAILY. Patient taking differently: Take 40 mg by mouth daily.  06/06/18   Birdie Sons, MD  rivaroxaban (XARELTO) 20 MG TABS tablet Take 1 tablet (20 mg total) by mouth daily. 08/21/16   Minna Merritts, MD  tolterodine (DETROL LA) 4 MG 24 hr capsule Take 1 capsule (4 mg total) by mouth daily. 02/12/20   McGowan, Larene [place] A, PA-C  traMADol (ULTRAM) 50 MG tablet Take 1 tablet (50 mg total) by mouth every 6 (six)  hours as needed. 02/06/20   Birdie Sons, MD  zolpidem (AMBIEN) 10 MG tablet Take 0.5-1 tablets (5-10 mg total) by mouth at bedtime as needed. 05/18/20   Birdie Sons, MD    Allergies Lisinopril, Myrbetriq [mirabegron], Atorvastatin, Dabigatran etexilate mesylate, and Dabigatran etexilate mesylate    Social History Social History   Tobacco Use  . Smoking status: Former Smoker    Packs/day: 0.00    Years: 12.00    Pack years: 0.00    Types: Cigarettes  . Smokeless tobacco: Current User    Types: Chew  . Tobacco comment: quit over 45 years ago  Vaping Use  . Vaping Use: Never used  Substance Use Topics  . Alcohol use: Yes    Alcohol/week: 5.0 standard drinks    Types: 5 Cans of beer per week  . Drug use: No    Review of Systems Patient denies headaches, rhinorrhea, blurry vision, numbness, shortness of breath, chest pain, edema, cough, abdominal pain, nausea, vomiting, diarrhea, dysuria, fevers, rashes or hallucinations unless otherwise stated above in HPI. ____________________________________________   PHYSICAL EXAM:  VITAL SIGNS: Vitals:   06/25/20 1242  BP: (!) 164/79  Pulse: 66  Resp: 18  Temp: 98.7 F (37.1 C)  SpO2: 96%     Constitutional: Alert and oriented.  Eyes: Conjunctivae are normal.  Head: Atraumatic. Nose: No congestion/rhinnorhea. Mouth/Throat: Mucous membranes are moist.   Neck: No stridor. Painless ROM.  Cardiovascular: Normal rate, regular rhythm. Grossly normal heart sounds.  Good peripheral circulation. Respiratory: Normal respiratory effort.  No retractions. Lungs CTAB. Gastrointestinal: Soft and nontender. No distention. No abdominal bruits. No CVA tenderness. Genitourinary:  Musculoskeletal: No lower extremity tenderness nor edema.  No joint effusions. Neurologic:  CN- intact.  No facial droop, Normal FNF.  Normal heel to shin.  Sensation intact bilaterally. Normal speech and language. No gross focal neurologic deficits are appreciated. No gait instability. Skin:  Skin is warm, dry and intact. No rash noted. Psychiatric: Mood and affect are normal. Speech and behavior are normal.  ____________________________________________   LABS (all labs ordered are listed, but only abnormal results are displayed)  No results found for this or any previous visit (from the past 24 hour(s)). ____________________________________________ ____________________________________________  [id number]  I personally reviewed all radiographic images ordered to evaluate for the above acute complaints and reviewed radiology reports and findings.  These findings were personally discussed with the patient.  Please see medical record for radiology report.  ____________________________________________   PROCEDURES  Procedure(s) performed:  Procedures    Critical Care performed: no ____________________________________________   INITIAL IMPRESSION / ASSESSMENT AND PLAN / ED COURSE  Pertinent labs & imaging results that were available during my care of the patient were reviewed by me and considered in my medical decision making (see chart for details).   DDX: sdh, ich, cva, electrolyte abn,  anemia  Ian Sanders is a 79 y.o. who presents to the ED with sx as described above. Patient is AFVSS in ED. Exam as above. Given current presentation have considered the above differential.  Appears well and nontoxic.  Will consult neurosurgery.  Patient will be signed out to oncoming physician pending neurosurgery consultation blood work and reassessment.  I discussed the case in consultation with Dr. Lacinda Axon who reviewed the images. Given that this is nearly a week out does recommend repeat CT imaging to ensure stability and at that point would be okay to continue the Xarelto. I do believe that most of his symptoms  are likely secondary to mild concussive syndrome will add on blood work and have placed the order for the repeat 6-hour CT.     The patient was evaluated in Emergency Department today for the symptoms described in the history of present illness. He/she was evaluated in the context of the global COVID-19 pandemic, which necessitated consideration that the patient might be at risk for infection with the SARS-CoV-2 virus that causes COVID-19. Institutional protocols and algorithms that pertain to the evaluation of patients at risk for COVID-19 are in a state of rapid change based on information released by regulatory bodies including the CDC and federal and state organizations. These policies and algorithms were followed during the patient's care in the ED.  As part of my medical decision making, I reviewed the following data within the [facility] notes reviewed and incorporated, Labs reviewed, notes from prior ED visits and [place] Controlled Substance Database   ____________________________________________   FINAL CLINICAL IMPRESSION(S) / ED DIAGNOSES  Final diagnoses:  Subdural hematoma ([place])      NEW MEDICATIONS STARTED DURING THIS VISIT:  New Prescriptions   No medications on file     Note:  This document was prepared using Dragon voice recognition  software and may include unintentional dictation errors.    Merlyn Lot, MD 06/25/20 [id number]    Merlyn Lot, MD 06/25/20 [phone]

## 2020-06-25 NOTE — ED Notes (Signed)
Pt asking to sit up on side of bed. Currently on monitor and BP cuff. Instruction to not ambulate without calling for assistance. Verbalized understanding.

## 2020-06-25 NOTE — Consult Note (Signed)
Referring Physician:  No referring provider defined for this encounter.  Primary Physician:  Ian Sons, MD  Chief Complaint:  Bilateral Subdural hematomas after suffering a fall about 3 to 4 days ago  History of Present Illness: Ian Sanders is a 79 y.o. male who is seen in the [facility] ED after reporting that he fell about 3 to 4 days ago at home, he reports that he tripped on the steps and fell backwards and hit the left posterior portion of his head on the ground.  He denied any loss of consciousness.  Initially, he had no symptoms with the exception of some soreness at the site of the impact, however over the last 2 to 3 days he began to get headaches, blurry vision, and some neck pain.  He came for evaluation at the [facility] ED for these complaints, and a head CT did show bilateral subdural hematomas.  He is on chronic anticoagulation with Xarelto for history of atrial fibrillation, and he has been taking this as recently as this morning.  Ian Sanders has no symptoms of cervical myelopathy.  The symptoms are causing a significant impact on the patient's life.   Review of Systems:  A 10 point review of systems is negative, except for the pertinent positives and negatives detailed in the HPI.  Past Medical History: Past Medical History:  Diagnosis Date  . Atrial fibrillation ([place])   . Basal cell carcinoma   . Benign paroxysmal positional vertigo   . Cancer ([place])   . History of chicken pox   . History of measles   . History of mumps   . Hypertension   . Mononeuritis of unspecified site   . Other seborrheic keratosis   . Stroke ([place])   . 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 Unspecified hypothyroidism     Past Surgical History: Past Surgical History:  Procedure Laterality Date  . Carotid Doppler Ultrasound  04/02/2010   Small amount Calcified plaque bilaterally, no significant stenosis.  . COLONOSCOPY  05/11/2014    Tubular Adenoma. Dr. Allen Norris  . COLONOSCOPY WITH PROPOFOL N/A 05/23/2018   Procedure: COLONOSCOPY WITH PROPOFOL;  Surgeon: [name], MD;  Location: [facility] ENDOSCOPY;  Service: Gastroenterology;  Laterality: N/A;  . DOPPLER ECHOCARDIOGRAPHY  04/02/2010   Mild left atrial dilation. Normal right atrium. No valvular disease. No thrombus. Normal LV function. EF>55%  . ESOPHAGOGASTRODUODENOSCOPY (EGD) WITH PROPOFOL N/A 08/16/2019   Procedure: ESOPHAGOGASTRODUODENOSCOPY (EGD) WITH PROPOFOL;  Surgeon: [name], MD;  Location: [facility] ENDOSCOPY;  Service: Gastroenterology;  Laterality: N/A;  . FINGER AMPUTATION    . HERNIA REPAIR    . MRI Brain with and without contrast  03/20/2010   Suggestive of basal ganglia lacunar infarct  . PROSTATECTOMY  1995   Abdominal  . SKIN SURGERY     multiple  . Thumb surgery      Allergies: Allergies as of 06/25/2020 - Review Complete 06/25/2020  Allergen Reaction Noted  . Lisinopril Swelling 02/12/2015  . Myrbetriq [mirabegron] Swelling 12/26/2015  . Atorvastatin Other (See Comments) 06/19/2015  . Dabigatran etexilate mesylate Other (See Comments) 12/14/2011  . Dabigatran etexilate mesylate Other (See Comments) 12/14/2011    Medications:  Current Facility-Administered Medications:  .  lidocaine (XYLOCAINE) 2 % jelly 1 application, 1 application, Urethral, Once, Stoioff, Scott C, MD  Current Outpatient Medications:  .  atenolol (TENORMIN) 50 MG tablet, Take 1 tablet (50 mg total) by mouth daily., Disp: 30 tablet, Rfl:  0 .  Biotin 10 MG TABS, Take 10 mg by mouth daily., Disp: , Rfl:  .  calcium-vitamin D (OSCAL) 250-125 MG-UNIT per tablet, Take 1 tablet by mouth 2 (two) times daily. , Disp: , Rfl:  .  chlorthalidone (HYGROTON) 25 MG tablet, Take 1 tablet (25 mg total) by mouth daily. (Patient taking differently: Take 25 mg by mouth every other day. ), Disp: 90 tablet, Rfl: 3 .  Cyanocobalamin (VITAMIN B-12 PO), Take by mouth daily., Disp: , Rfl:  .   cyclobenzaprine (FLEXERIL) 10 MG tablet, Take 0.5 tablets (5 mg total) by mouth 2 (two) times daily as needed., Disp: 30 tablet, Rfl: 3 .  Docusate Calcium (STOOL SOFTENER PO), Take by mouth as needed., Disp: , Rfl:  .  EPINEPHrine (EPIPEN 2-PAK) 0.3 mg/0.3 mL IJ SOAJ injection, Inject 0.3 mg into the muscle once. , Disp: , Rfl:  .  ferrous sulfate (FERROUSUL) 325 (65 FE) MG tablet, Take 1 tablet (325 mg total) by mouth every other day., Disp: 90 tablet, Rfl: 0 .  fesoterodine (TOVIAZ) 8 MG TB24 tablet, Take 1 tablet (8 mg total) by mouth daily., Disp: 30 tablet, Rfl: 11 .  flecainide (TAMBOCOR) 50 MG tablet, Take 1 tablet (50 mg total) by mouth 2 (two) times daily., Disp: 180 tablet, Rfl: 3 .  gabapentin (NEURONTIN) 100 MG capsule, Take 1 capsule (100 mg total) by mouth 3 (three) times daily., Disp: 270 capsule, Rfl: 4 .  Leuprolide Acetate, 6 Month, (LUPRON) 45 MG injection, Inject 45 mg into the muscle every 6 (six) months., Disp: , Rfl:  .  omeprazole (PRILOSEC) 40 MG capsule, TAKE ONE CAPSULE DAILY. (Patient taking differently: Take 40 mg by mouth daily. ), Disp: 90 capsule, Rfl: 4 .  rivaroxaban (XARELTO) 20 MG TABS tablet, Take 1 tablet (20 mg total) by mouth daily., Disp: 30 tablet, Rfl: 11 .  tolterodine (DETROL LA) 4 MG 24 hr capsule, Take 1 capsule (4 mg total) by mouth daily., Disp: 30 capsule, Rfl: 11 .  traMADol (ULTRAM) 50 MG tablet, Take 1 tablet (50 mg total) by mouth every 6 (six) hours as needed., Disp: 30 tablet, Rfl: 0 .  zolpidem (AMBIEN) 10 MG tablet, Take 0.5-1 tablets (5-10 mg total) by mouth at bedtime as needed., Disp: 30 tablet, Rfl: 5   Social History: Social History   Tobacco Use  . Smoking status: Former Smoker    Packs/day: 0.00    Years: 12.00    Pack years: 0.00    Types: Cigarettes  . Smokeless tobacco: Current User    Types: Chew  . Tobacco comment: quit over 45 years ago  Vaping Use  . Vaping Use: Never used  Substance Use Topics  . Alcohol use: Yes     Alcohol/week: 5.0 standard drinks    Types: 5 Cans of beer per week  . Drug use: No    Family Medical History: Family History  Problem Relation Age of Onset  . Heart disease Mother 23       CABG  . Hypertension Mother   . Heart attack Mother   . Hypertension Brother   . Heart disease Sister        stents  . Prostate cancer Brother   . Kidney cancer Brother   . Skin cancer Brother   . Hypertension Brother   . Bladder Cancer Sister   . Leukemia Father   . Stroke Father        multiple  . Cerebral aneurysm Sister   .  Kidney cancer Brother     Physical Examination: Vitals:   06/25/20 1242  BP: (!) 164/79  Pulse: 66  Resp: 18  Temp: 98.7 F (37.1 C)  SpO2: 96%     General: Patient is well developed, well nourished, calm, collected, and in no apparent distress.  Psychiatric: Patient is non-anxious.  Head:  Pupils equal, round, and reactive to light.  ENT:  Oral mucosa appears well hydrated.  Neck:   Supple.  Full range of motion.  Respiratory: Patient is breathing without any difficulty.   NEUROLOGICAL:   Neurologic exam:  Mental status: alertness: alert, orientation: person, place, time, affect: normal Able to name 3/3 objects correctly. Able to repeat simple phrases and count backwards from 10 without difficulty.  Speech: fluent and clear Cranial nerves:  II: Visual fields are full by confrontation, no ptosis III/IV/VI: extra-ocular motions intact bilaterally V/VII:no evidence of facial droop or weakness and facial sensation intact VIII: hearing normal XI: trapezius strength symmetric,  sternocleidomastoid strength symmetric XII: tongue strength symmetric  Motor:strength symmetric 5/5, normal muscle mass and tone in all extremities and no pronator drift Sensory: intact to light touch in all extremities Reflexes: 2+ and symmetric bilaterally for arms and legs Coordination: some ataxia with finger to nose test on left however he reports this is  chronic and not new since fall Gait: normal, no difficulty with tandem gait ROM of spine: Full.  Some mild TTP over left paraspinal musculature on left side of cervical spine which extends up to the back of his skull, thinks this is where he hit his head.    Imaging: CT head 06/25/2020:   IMPRESSION: 1. Small bilateral subdural hematomas. On the right this is along the parietal-occipital convexity measuring 5 mm, on the left frontoparietal convexity measuring 4 mm. Slight mass effect from right subdural hematoma but no midline shift. 2. No skull fracture. 3. Mild chronic small vessel ischemia.  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  Assessment and Plan: Mr. Montz is a pleasant 79 y.o. male on chronic AC for atrial fibrillation (Xarelto 20mg  daily) with small bilateral hematomas.  His neurologic exam is non focal and reassuring.   Dr. Lacinda Axon has reviewed his head CT and we recommend a repeat head CT in 6 hours from prior. If this is stable in appearance, he can continue his Xarelto.  He was notified of symptoms that would require further medical attention, including weakness on one side of the body, worsening dizziness or blurred vision, slurred speech, or any new neurologic deficits.  He is to follow-up in 1 month in clinic with a repeat head CT.  I have discussed the condition with the patient, including showing him the radiographs. He and his wife verbalized understanding.    Lonell Face, NP Dept. of Neurosurgery

## 2020-06-25 NOTE — ED Triage Notes (Signed)
Patient reports falling last week (possibly on 06/19/20). States he did hit head at time of accident, had small amount of scalp bleeding. States since yesterday he has had "bad" headaches with +dizziness and slight blurry vision. Patient is on Xarelto.

## 2020-06-25 NOTE — H&P (Signed)
History and Physical    Ian Sanders EHU:[id number] DOB: 12-22-1940 DOA: 06/25/2020  PCP: Birdie Sons, MD   Patient coming from: home  I have personally briefly reviewed patient's old medical records in [place]  Chief Complaint: Headache, dizziness, blurred vision  HPI: Ian Sanders is a 79 y.o. male with medical history significant for paroxysmal A. fib, on Xarelto, cirrhosis and chronic thrombocytopenia recently seen by hematology, HTN, history of TIA, who presents to the emergency room with 1 day history of severe headaches associated with dizziness and visual disturbance.  Reported having fallen a week ago around 06/19/2020 but not seeking medical attention.  States he lost his footing while walking down some stairs hitting his head on a plastic lawn chair.  CT imaging showed a small bilateral subdural hematoma.  The emergency room provider spoke with neurosurgical nurse practitioner on-call who recommended observation in the ER and repeating head CT in 6 hours with plans to discharge with resumption of Xarelto if no change.  CT head was repeated in 6 hours but showed slight increase in left subdural hematoma.  The emergency room provider again contacted neurosurgeon, Dr. Lacinda Axon who recommended overnight observation with plans to hold Xarelto for 10 days.  Hospitalist consulted for admission.  Platelets 56,000.  Review of Systems: As per HPI otherwise all other systems on review of systems negative.    Past Medical History:  Diagnosis Date  . Atrial fibrillation ([place])   . Basal cell carcinoma   . Benign paroxysmal positional vertigo   . Cancer ([place])   . History of chicken pox   . History of measles   . History of mumps   . Hypertension   . Mononeuritis of unspecified site   . Other seborrheic keratosis   . Stroke ([place])   . 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 Unspecified hypothyroidism     Past Surgical  History:  Procedure Laterality Date  . Carotid Doppler Ultrasound  04/02/2010   Small amount Calcified plaque bilaterally, no significant stenosis.  . COLONOSCOPY  05/11/2014   Tubular Adenoma. Dr. Allen Norris  . COLONOSCOPY WITH PROPOFOL N/A 05/23/2018   Procedure: COLONOSCOPY WITH PROPOFOL;  Surgeon: [name], MD;  Location: [facility] ENDOSCOPY;  Service: Gastroenterology;  Laterality: N/A;  . DOPPLER ECHOCARDIOGRAPHY  04/02/2010   Mild left atrial dilation. Normal right atrium. No valvular disease. No thrombus. Normal LV function. EF>55%  . ESOPHAGOGASTRODUODENOSCOPY (EGD) WITH PROPOFOL N/A 08/16/2019   Procedure: ESOPHAGOGASTRODUODENOSCOPY (EGD) WITH PROPOFOL;  Surgeon: [name], MD;  Location: [facility] ENDOSCOPY;  Service: Gastroenterology;  Laterality: N/A;  . FINGER AMPUTATION    . HERNIA REPAIR    . MRI Brain with and without contrast  03/20/2010   Suggestive of basal ganglia lacunar infarct  . PROSTATECTOMY  1995   Abdominal  . SKIN SURGERY     multiple  . Thumb surgery       reports that he has quit smoking. His smoking use included cigarettes. He smoked 0.00 packs per day for 12.00 years. His smokeless tobacco use includes chew. He reports current alcohol use of about 5.0 standard drinks of alcohol per week. He reports that he does not use drugs.  Allergies  Allergen Reactions  . Lisinopril Swelling    Tongue swelling Tongue swelling  Tongue swelling Tongue swelling  Tongue swelling   . Myrbetriq [Mirabegron] Swelling    Of the tongue  . Atorvastatin Other (See Comments)  Dizziness and Fatigue  Dizziness and Fatigue  . Dabigatran Etexilate Mesylate Other (See Comments)    Stomach ulcers ([place])  . Dabigatran Etexilate Mesylate Other (See Comments)    Stomach ulcers (Pradaxa)    Family History  Problem Relation Age of Onset  . Heart disease Mother 67       CABG  . Hypertension Mother   . Heart attack Mother   . Hypertension Brother   . Heart disease Sister         stents  . Prostate cancer Brother   . Kidney cancer Brother   . Skin cancer Brother   . Hypertension Brother   . Bladder Cancer Sister   . Leukemia Father   . Stroke Father        multiple  . Cerebral aneurysm Sister   . Kidney cancer Brother       Prior to Admission medications   Medication Sig Start Date End Date Taking? Authorizing Provider  amLODipine (NORVASC) 5 MG tablet Take 5 mg by mouth daily. 05/30/20  Yes [provider]  atenolol (TENORMIN) 50 MG tablet Take 1 tablet (50 mg total) by mouth daily. 08/03/18  Yes Gollan, Kathlene November, MD  chlorthalidone (HYGROTON) 25 MG tablet Take 1 tablet (25 mg total) by mouth daily. Patient taking differently: Take 25 mg by mouth every other day.  07/15/17  Yes Minna Merritts, MD  flecainide (TAMBOCOR) 50 MG tablet Take 1 tablet (50 mg total) by mouth 2 (two) times daily. 07/15/17  Yes Minna Merritts, MD  gabapentin (NEURONTIN) 100 MG capsule Take 1 capsule (100 mg total) by mouth 3 (three) times daily. 09/26/19  Yes Birdie Sons, MD  omeprazole (PRILOSEC) 40 MG capsule TAKE ONE CAPSULE DAILY. Patient taking differently: Take 40 mg by mouth daily.  06/06/18  Yes Birdie Sons, MD  tolterodine (DETROL LA) 4 MG 24 hr capsule Take 1 capsule (4 mg total) by mouth daily. 02/12/20  Yes McGowan, Larene Beach A, PA-C  zolpidem (AMBIEN) 10 MG tablet Take 0.5-1 tablets (5-10 mg total) by mouth at bedtime as needed. 05/18/20  Yes Birdie Sons, MD  Biotin 10 MG TABS Take 10 mg by mouth daily.    [provider]  calcium-vitamin D (OSCAL) 250-125 MG-UNIT per tablet Take 1 tablet by mouth 2 (two) times daily.     [provider]  Cyanocobalamin (VITAMIN B-12 PO) Take by mouth daily.    [provider]  cyclobenzaprine (FLEXERIL) 10 MG tablet Take 0.5 tablets (5 mg total) by mouth 2 (two) times daily as needed. 01/17/18   Birdie Sons, MD  diltiazem (CARDIZEM) 30 MG tablet Take 30 mg by mouth at bedtime. 06/08/20    [provider]  Docusate Calcium (STOOL SOFTENER PO) Take by mouth as needed.    [provider]  EPINEPHrine (EPIPEN 2-PAK) 0.3 mg/0.3 mL IJ SOAJ injection Inject 0.3 mg into the muscle once.  02/12/15   [provider]  ferrous sulfate (FERROUSUL) 325 (65 FE) MG tablet Take 1 tablet (325 mg total) by mouth every other day. 09/16/19   Earlie Server, MD  fesoterodine (TOVIAZ) 8 MG TB24 tablet Take 1 tablet (8 mg total) by mouth daily. 09/25/19   Zara Council A, PA-C  Leuprolide Acetate, 6 Month, (LUPRON) 45 MG injection Inject 45 mg into the muscle every 6 (six) months.    [provider]  rivaroxaban (XARELTO) 20 MG TABS tablet Take 1 tablet (20 mg total) by  mouth daily. 08/21/16   Minna Merritts, MD    Physical Exam: Vitals:   06/25/20 1242 06/25/20 1244 06/25/20 1951 06/25/20 2013  BP: (!) 164/79  (!) 175/62 (!) 169/68  Pulse: 66  60 60  Resp: 18  18 (!) 22  Temp: 98.7 F (37.1 C)     TempSrc: Oral     SpO2: 96%  97% 98%  Weight:  78 kg    Height:  5\' 10"  (1.778 m)       Vitals:   06/25/20 1242 06/25/20 1244 06/25/20 1951 06/25/20 2013  BP: (!) 164/79  (!) 175/62 (!) 169/68  Pulse: 66  60 60  Resp: 18  18 (!) 22  Temp: 98.7 F (37.1 C)     TempSrc: Oral     SpO2: 96%  97% 98%  Weight:  78 kg    Height:  5\' 10"  (1.778 m)        Constitutional:  Awake and alert, no apparent distress  HEENT:      Head: Normocephalic and atraumatic.         Eyes: PERLA, EOMI, Conjunctivae are normal. Sclera is non-icteric.       Mouth/Throat: Mucous membranes are moist.       Neck: Supple with no signs of meningismus. Cardiovascular: Regular rate and rhythm. No murmurs, gallops, or rubs. 2+ symmetrical distal pulses are present . No JVD. No LE edema Respiratory: Respiratory effort normal .Lungs sounds clear bilaterally. No wheezes, crackles, or rhonchi.  Gastrointestinal: Soft, non tender, and non distended with positive bowel sounds. No rebound or  guarding. Genitourinary: No CVA tenderness. Musculoskeletal: Nontender with normal range of motion in all extremities. No cyanosis, or erythema of extremities. Neurologic: Normal speech and language. Face is symmetric. Moving all extremities. No gross focal neurologic deficits . Skin: Skin is warm, dry.  No rash or ulcers Psychiatric: Mood and affect are normal Speech and behavior are normal   Labs on Admission: I have personally reviewed following labs and imaging studies  CBC: Recent Labs  Lab 06/25/20 1611  WBC 2.4*  NEUTROABS 1.8  HGB 13.1  HCT 40.0  MCV 83.3  PLT 56*   Basic Metabolic Panel: Recent Labs  Lab 06/25/20 1611  NA 136  K 3.5  CL 95*  CO2 26  GLUCOSE 120*  BUN 7*  CREATININE 0.77  CALCIUM 9.4   GFR: Estimated Creatinine Clearance: 77.3 mL/min (by C-G formula based on SCr of 0.77 mg/dL). Liver Function Tests: Recent Labs  Lab 06/25/20 1611  AST 30  ALT 17  ALKPHOS 86  BILITOT 1.6*  PROT 7.6  ALBUMIN 4.6   No results for input(s): LIPASE, AMYLASE in the last 168 hours. No results for input(s): AMMONIA in the last 168 hours. Coagulation Profile: Recent Labs  Lab 06/25/20 1611  INR 1.8*   Cardiac Enzymes: No results for input(s): CKTOTAL, CKMB, CKMBINDEX, TROPONINI in the last 168 hours. BNP (last 3 results) No results for input(s): PROBNP in the last 8760 hours. HbA1C: No results for input(s): HGBA1C in the last 72 hours. CBG: No results for input(s): GLUCAP in the last 168 hours. Lipid Profile: No results for input(s): CHOL, HDL, LDLCALC, TRIG, CHOLHDL, LDLDIRECT in the last 72 hours. Thyroid Function Tests: No results for input(s): TSH, T4TOTAL, FREET4, T3FREE, THYROIDAB in the last 72 hours. Anemia Panel: No results for input(s): VITAMINB12, FOLATE, FERRITIN, TIBC, IRON, RETICCTPCT in the last 72 hours. Urine analysis:    Component Value Date/Time  COLORURINE YELLOW (A) 05/09/2020 1946   APPEARANCEUR CLEAR (A) 05/09/2020 1946     APPEARANCEUR Clear 07/27/2019 1319   LABSPEC 1.008 05/09/2020 1946   LABSPEC 1.009 08/10/2012 1219   PHURINE 7.0 05/09/2020 1946   GLUCOSEU NEGATIVE 05/09/2020 1946   GLUCOSEU Negative 08/10/2012 [place] 05/09/2020 1946   BILIRUBINUR NEGATIVE 05/09/2020 1946   BILIRUBINUR Negative 07/27/2019 1319   BILIRUBINUR Negative 08/10/2012 1219   [place] 05/09/2020 1946   PROTEINUR NEGATIVE 05/09/2020 1946   UROBILINOGEN 0.2 04/27/2017 0949   NITRITE NEGATIVE 05/09/2020 1946   LEUKOCYTESUR NEGATIVE 05/09/2020 1946   LEUKOCYTESUR Negative 08/10/2012 1219    Radiological Exams on Admission: CT Head Wo Contrast  Result Date: 06/25/2020 CLINICAL DATA:  Headache EXAM: CT HEAD WITHOUT CONTRAST TECHNIQUE: Contiguous axial images were obtained from the base of the skull through the vertex without intravenous contrast. COMPARISON:  06/25/2020 FINDINGS: Brain: Left parietal subdural hematoma again noted. This measures 8 mm in thickness compared to 5 mm previously. Small right posterior parietal subdural hematoma measuring 4 mm, stable. No mass effect or midline shift. No acute infarct or hydrocephalus. Vascular: No hyperdense vessel or unexpected calcification. Skull: No acute calvarial abnormality. Sinuses/Orbits: Visualized paranasal sinuses and mastoids clear. Orbital soft tissues unremarkable. Other: None IMPRESSION: Small bilateral parietal subdural hematomas, left slightly greater than right. The left subdural hematoma has enlarged slightly. No significant mass effect or midline shift. Electronically Signed   By: Rolm Baptise M.D.   On: 06/25/2020 20:14   CT Head Wo Contrast  Result Date: 06/25/2020 CLINICAL DATA:  Headache, intracranial hemorrhage suspected Headache after fall Fall last week. Headache and dizziness. Blurry vision. On anticoagulation. EXAM: CT HEAD WITHOUT CONTRAST TECHNIQUE: Contiguous axial images were obtained from the base of the skull through the vertex  without intravenous contrast. COMPARISON:  Head CT 01/14/2018 FINDINGS: Brain: Thin subdural hematoma is most prominent along the right parietooccipital convexity and measure up to 5 mm in depth, series 4, image 56. Slight adjacent mass effect. Thin left frontoparietal subdural measures up to 4 mm (series 4 image #51). No mass effect from left subdural hemorrhage. There is no midline shift. No subarachnoid or intraparenchymal bleed. There is no hydrocephalus. Basilar cisterns are patent. Mild chronic small vessel ischemia again seen. Tiny remote lacunar infarct in the left thalamus versus prominent perivascular space. Vascular: Atherosclerosis of skullbase vasculature without hyperdense vessel or abnormal calcification. Skull: No skull fracture, particularly in the region of subdural bleed. Sinuses/Orbits: Paranasal sinuses and mastoid air cells are clear. The visualized orbits are unremarkable. Other: None. IMPRESSION: 1. Small bilateral subdural hematomas. On the right this is along the parietal-occipital convexity measuring 5 mm, on the left frontoparietal convexity measuring 4 mm. Slight mass effect from right subdural hematoma but no midline shift. 2. No skull fracture. 3. Mild chronic small vessel ischemia. Critical Value/emergent results were called by telephone at the time of interpretation on 06/25/2020 at 2:03 pm to Dr Conni Slipper , who verbally acknowledged these results. Electronically Signed   By: Keith Rake M.D.   On: 06/25/2020 14:04   CT Cervical Spine Wo Contrast  Result Date: 06/25/2020 CLINICAL DATA:  Neck trauma (Age >= 65y) Fall last week. Headache and dizziness. Blurry vision. On anticoagulation. EXAM: CT CERVICAL SPINE WITHOUT CONTRAST TECHNIQUE: Multidetector CT imaging of the cervical spine was performed without intravenous contrast. Multiplanar CT image reconstructions were also generated. COMPARISON:  None. FINDINGS: Alignment: Trace degenerative anterolisthesis of C5 on C6. No  traumatic  subluxation. Skull base and vertebrae: No acute fracture. Vertebral body heights are maintained. The dens and skull base are intact. Soft tissues and spinal canal: No prevertebral fluid or swelling. No visible canal hematoma. Disc levels: Diffuse disc space narrowing and endplate spurring from F0-X3 through C7-T1, most prominent at C4-C5. There is multilevel facet hypertrophy. Multilevel neural foraminal stenosis but no central canal narrowing. Upper chest: Negative. Other: Carotid calcifications. 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 Electronically Signed   By: Keith Rake M.D.   On: 06/25/2020 13:54     Assessment/Plan 79 year old male with a history of paroxysmal A. fib, on Xarelto, cirrhosis and chronic thrombocytopenia recently seen by hematology, HTN, history of TIA, admitted with traumatic bilateral subdural hematoma symptomatic for headache, unsteady gait and visual disturbance with 6 hours repeat CT head showing enlargement, but without worsening symptoms.  Platelets 56,000    Traumatic bilateral subdural hematoma without loss of consciousness, initial encounter [facility]) -Patient suffered a fall on 06/19/2020 with symptoms starting several days later -Patient had CT head on 06/25/2020 at 1400 and again at 2000 showing slight enlargement and left subdural -Monitor overnight for worsening neurologic symptoms -Repeat CT head in 12 hours, at 0 800 on 06/26/2020 per neurosurgery recommendations -Per neurosurgery, okay to resume Xarelto in 10 days however given thrombocytopenia, bleeding risk versus risk of stroke may be revisited.  Patient has had bleeding complications in the past -Consult neurosurgery in the a.m. based on results of CT head scheduled for 8 AM    Paroxysmal atrial fibrillation ([facility])   Chronic anticoagulation -Xarelto being held for reasons above -Continue home atenolol  and diltiazem for rate control.  Continue flecainide    HTN (hypertension) -Continue meds above    Cirrhosis of liver without ascites ([facility]) -No acute concerns    Chronic thrombocytopenia ([facility]) -Platelet count 56,000 -Recently seen by hematologist, Dr. Rogue Bussing  DVT prophylaxis: SCDs code Status: full code  Family Communication:  none  Disposition Plan: Back to previous home environment Consults called: none  Status: Observation    Athena Masse MD [facility]     06/25/2020, 9:03 PM

## 2020-06-25 NOTE — ED Notes (Signed)
Pt up to bedside toilet with steady gait. Cane used by pt for additional support. Pt alert and calm at this time. Expressing needs and answering questions without difficulty. Provided for safety and comfort and will continue to assess.

## 2020-06-25 NOTE — ED Provider Notes (Signed)
Repeat head CT does show slightly worsening left parietal hematoma. Discussed with Dr. Lacinda Axon with neurosurgery who evaluated the images and recommended admission for further observation and repeat CT in the morning. Recommended holding xeralto for 10 days. Discussed plan with the patient.   Nance Pear, MD 06/25/20 2044

## 2020-06-25 NOTE — ED Notes (Signed)
Pt seen ambulating in the hallway. No distress noted. States he just wants to stretch his legs.

## 2020-06-26 ENCOUNTER — Observation Stay: Payer: PPO

## 2020-06-26 ENCOUNTER — Encounter: Payer: Self-pay | Admitting: Internal Medicine

## 2020-06-26 DIAGNOSIS — I10 Essential (primary) hypertension: Secondary | ICD-10-CM | POA: Diagnosis not present

## 2020-06-26 DIAGNOSIS — I480 Paroxysmal atrial fibrillation: Secondary | ICD-10-CM | POA: Diagnosis not present

## 2020-06-26 DIAGNOSIS — D693 Immune thrombocytopenic purpura: Secondary | ICD-10-CM

## 2020-06-26 DIAGNOSIS — I6201 Nontraumatic acute subdural hemorrhage: Secondary | ICD-10-CM | POA: Diagnosis not present

## 2020-06-26 DIAGNOSIS — S065X0A Traumatic subdural hemorrhage without loss of consciousness, initial encounter: Secondary | ICD-10-CM | POA: Diagnosis not present

## 2020-06-26 LAB — APTT: aPTT: 32 seconds (ref 24–36)

## 2020-06-26 LAB — SARS CORONAVIRUS 2 BY RT PCR (HOSPITAL ORDER, PERFORMED IN ~~LOC~~ HOSPITAL LAB): SARS Coronavirus 2: NEGATIVE

## 2020-06-26 MED ORDER — PANTOPRAZOLE SODIUM 40 MG PO TBEC
40.0000 mg | DELAYED_RELEASE_TABLET | Freq: Every day | ORAL | Status: DC
  Administered 2020-06-26 – 2020-06-27 (×2): 40 mg via ORAL
  Filled 2020-06-26 (×2): qty 1

## 2020-06-26 MED ORDER — POLYETHYLENE GLYCOL 3350 17 G PO PACK
17.0000 g | PACK | Freq: Every day | ORAL | Status: DC
  Administered 2020-06-26 – 2020-06-27 (×2): 17 g via ORAL
  Filled 2020-06-26 (×2): qty 1

## 2020-06-26 MED ORDER — GABAPENTIN 100 MG PO CAPS
200.0000 mg | ORAL_CAPSULE | Freq: Every day | ORAL | Status: DC
  Administered 2020-06-26: 200 mg via ORAL
  Filled 2020-06-26: qty 2

## 2020-06-26 MED ORDER — GABAPENTIN 100 MG PO CAPS
100.0000 mg | ORAL_CAPSULE | Freq: Every day | ORAL | Status: DC
  Administered 2020-06-26 – 2020-06-27 (×2): 100 mg via ORAL
  Filled 2020-06-26 (×2): qty 1

## 2020-06-26 MED ORDER — FLECAINIDE ACETATE 50 MG PO TABS
50.0000 mg | ORAL_TABLET | Freq: Two times a day (BID) | ORAL | Status: DC
  Administered 2020-06-26 – 2020-06-27 (×3): 50 mg via ORAL
  Filled 2020-06-26 (×4): qty 1

## 2020-06-26 MED ORDER — ATENOLOL 50 MG PO TABS
50.0000 mg | ORAL_TABLET | Freq: Every day | ORAL | Status: DC
  Filled 2020-06-26 (×2): qty 1

## 2020-06-26 NOTE — Evaluation (Signed)
Occupational Therapy Evaluation Patient Details Name: Ian Sanders MRN: [id number] DOB: 09/19/1941 Today's Date: 06/26/2020    History of Present Illness Pt is a 79 y.o. male with medical history significant for paroxysmal A. fib, on Xarelto, cirrhosis and chronic thrombocytopenia recently seen by hematology, HTN, history of TIA, who presents to the emergency room with 1 day history of severe headaches associated with dizziness and visual disturbance.  Reported having fallen a week ago around 06/19/2020 but not seeking medical attention.  States he lost his footing while walking down some stairs hitting his head on a plastic lawn chair.  CT imaging showed a small bilateral subdural hematoma.   Clinical Impression   Patient presenting with decreased I in self care, balance, functional mobility/transfers, endurance, and safety awareness.  Patient reports being mod I PTA. Patient currently functioning at supervision - CGA for self care tasks and balance. Patient will benefit from acute OT to increase overall independence in the areas of ADLs, functional mobility, and safety awareness in order to safely discharge home with caregiver.    Follow Up Recommendations  No OT follow up;Supervision - Intermittent    Equipment Recommendations  None recommended by OT    Recommendations for Other Services Other (comment) (none at this time)     Precautions / Restrictions Precautions Precautions: Fall Restrictions Weight Bearing Restrictions: No      Mobility Bed Mobility Overal bed mobility: Modified Independent       Transfers Overall transfer level: Needs assistance Equipment used: Straight cane Transfers: Sit to/from Stand Sit to Stand: Supervision         Balance Overall balance assessment: Needs assistance Sitting-balance support: Feet supported Sitting balance-Leahy Scale: Good     Standing balance support: Single extremity supported Standing balance-Leahy Scale: Fair          ADL either performed or assessed with clinical judgement   ADL Overall ADL's : Needs assistance/impaired      General ADL Comments: Pt seated on EOB to don B socks and shoes with supervision overall. Pt standing at sink with close supervision for grooming tasks. Pt ambulating with SPC cane with CGA into bathroom with CGA for toilet transfer.     Vision Baseline Vision/History: Wears glasses Wears Glasses: At all times Patient Visual Report: No change from baseline Vision Assessment?: No apparent visual deficits            Pertinent Vitals/Pain Pain Assessment: No/denies pain     Hand Dominance Right   Extremity/Trunk Assessment Upper Extremity Assessment Upper Extremity Assessment: Overall WFL for tasks assessed   Lower Extremity Assessment Lower Extremity Assessment: Overall WFL for tasks assessed   Cervical / Trunk Assessment Cervical / Trunk Assessment: Normal   Communication Communication Communication: No difficulties   Cognition Arousal/Alertness: Awake/alert Behavior During Therapy: WFL for tasks assessed/performed Overall Cognitive Status: Within Functional Limits for tasks assessed             Exercises Other Exercises Other Exercises: Family entered room, pt requested to return to bed so wife could sit up in recliner. Agreeable to return to chair for dinner.   Shoulder Instructions      Home Living Family/patient expects to be discharged to:: Private residence Living Arrangements: Spouse/significant other Available Help at Discharge: Family Type of Home: House Home Access: Stairs to enter [employer] of Steps: 2-3 steps   Home Layout: One level     Bathroom Shower/Tub: Corporate investment banker: Standard Bathroom Accessibility: Yes How Accessible:  Accessible via walker Home Equipment: Cane - single point;Walker - 2 wheels          Prior Functioning/Environment Level of Independence: Independent            OT Problem List: Decreased strength;Decreased activity tolerance;Decreased safety awareness;Impaired balance (sitting and/or standing)      OT Treatment/Interventions: Self-care/ADL training;Therapeutic exercise;Therapeutic activities;Energy conservation;Patient/family education;Manual therapy;Balance training    OT Goals(Current goals can be found in the care plan section) Acute Rehab OT Goals Patient Stated Goal: to go home OT Goal Formulation: With patient Time For Goal Achievement: 07/10/20 Potential to Achieve Goals: Good ADL Goals Pt Will Transfer to Toilet: with modified independence;ambulating Pt Will Perform Toileting - Clothing Manipulation and hygiene: with modified independence;sit to/from stand Pt Will Perform Tub/Shower Transfer: with supervision;ambulating;shower seat  OT Frequency: Min 1X/week   Barriers to D/C: Other (comment)  none known at this time          AM-PAC OT "6 Clicks" Daily Activity     Outcome Measure Help from another person eating meals?: None Help from another person taking care of personal grooming?: None Help from another person toileting, which includes using toliet, bedpan, or urinal?: A Little Help from another person bathing (including washing, rinsing, drying)?: None Help from another person to put on and taking off regular upper body clothing?: None Help from another person to put on and taking off regular lower body clothing?: A Little 6 Click Score: 22   End of Session Equipment Utilized During Treatment: Other (comment) [facility]) Nurse Communication: Mobility status  Activity Tolerance: Patient tolerated treatment well Patient left: in bed;with call bell/phone within reach  OT Visit Diagnosis: Unsteadiness on feet (R26.81);Muscle weakness (generalized) (M62.81)                Time: 5732-2025 OT Time Calculation (min): 23 min Charges:  OT General Charges $OT Visit: 1 Visit OT Evaluation $OT Eval Low Complexity: 1 Low OT  Treatments $Self Care/Home Management : 8-22 mins  Darleen Crocker, MS, OTR/L , CBIS ascom [phone]  06/26/20, 3:06 PM

## 2020-06-26 NOTE — ED Notes (Signed)
Pt provided with snack tray per admitting MD request. Pt sitting up on stretcher. No complaints at this time.

## 2020-06-26 NOTE — Plan of Care (Signed)

## 2020-06-26 NOTE — Consult Note (Signed)
Referring Physician:  No referring provider defined for this encounter.  Primary Physician:  Birdie Sons, MD  Chief Complaint:  Bilateral Subdural hematomas after suffering a fall about 4 to 5 days ago  History of Present Illness: 06/26/2020: Ian Sanders repeat head CT last night showed an increase in the Left parietal subdural hematoma to 46mm (18mm previously). R sided SDH remains stable at 38mm. Xarelto was held this morning. Repeat head CT from 0800 shows stable L and R SDH without mass effect or MLS. His neurologic exam remains unchanged and is intact. He continues to report a mild HA but no nausea.    Interval History: Ian Sanders is a 79 y.o. male who is seen in the [facility] ED after reporting that he fell about 3 to 4 days ago at home, he reports that he tripped on the steps and fell backwards and hit the left posterior portion of his head on the ground.  He denied any loss of consciousness.  Initially, he had no symptoms with the exception of some soreness at the site of the impact, however over the last 2 to 3 days he began to get headaches, blurry vision, and some neck pain.  He came for evaluation at the [facility] ED for these complaints, and a head CT did show bilateral subdural hematomas.  He is on chronic anticoagulation with Xarelto for history of atrial fibrillation, and he has been taking this as recently as this morning.  Ian Sanders has no symptoms of cervical myelopathy.  The symptoms are causing a significant impact on the patient's life.   Review of Systems:  A 10 point review of systems is negative, except for the pertinent positives and negatives detailed in the HPI.  Past Medical History: Past Medical History:  Diagnosis Date  . Atrial fibrillation ([place])   . Basal cell carcinoma   . Benign paroxysmal positional vertigo   . Cancer ([place])   . History of chicken pox   . History of measles   . History of mumps   . Hypertension   . Mononeuritis of unspecified  site   . Other seborrheic keratosis   . Stroke ([place])   . 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 Unspecified hypothyroidism     Past Surgical History: Past Surgical History:  Procedure Laterality Date  . Carotid Doppler Ultrasound  04/02/2010   Small amount Calcified plaque bilaterally, no significant stenosis.  . COLONOSCOPY  05/11/2014   Tubular Adenoma. Dr. Allen Norris  . COLONOSCOPY WITH PROPOFOL N/A 05/23/2018   Procedure: COLONOSCOPY WITH PROPOFOL;  Surgeon: [name], MD;  Location: [facility] ENDOSCOPY;  Service: Gastroenterology;  Laterality: N/A;  . DOPPLER ECHOCARDIOGRAPHY  04/02/2010   Mild left atrial dilation. Normal right atrium. No valvular disease. No thrombus. Normal LV function. EF>55%  . ESOPHAGOGASTRODUODENOSCOPY (EGD) WITH PROPOFOL N/A 08/16/2019   Procedure: ESOPHAGOGASTRODUODENOSCOPY (EGD) WITH PROPOFOL;  Surgeon: [name], MD;  Location: [facility] ENDOSCOPY;  Service: Gastroenterology;  Laterality: N/A;  . FINGER AMPUTATION    . HERNIA REPAIR    . MRI Brain with and without contrast  03/20/2010   Suggestive of basal ganglia lacunar infarct  . PROSTATECTOMY  1995   Abdominal  . SKIN SURGERY     multiple  . Thumb surgery      Allergies: Allergies as of 06/25/2020 - Review Complete 06/25/2020  Allergen Reaction Noted  . Lisinopril Swelling 02/12/2015  . Myrbetriq [mirabegron] Swelling 12/26/2015  . Atorvastatin Other (  See Comments) 06/19/2015  . Dabigatran etexilate mesylate Other (See Comments) 12/14/2011  . Dabigatran etexilate mesylate Other (See Comments) 12/14/2011    Medications:  Current Facility-Administered Medications:  .  acetaminophen (TYLENOL) tablet 650 mg, 650 mg, Oral, Q6H PRN **OR** acetaminophen (TYLENOL) suppository 650 mg, 650 mg, Rectal, Q6H PRN, Judd Gaudier V, MD .  atenolol (TENORMIN) tablet 50 mg, 50 mg, Oral, Daily, Eppie Gibson M, MD .  flecainide [facility]) tablet 50 mg, 50 mg,  Oral, Q12H, Wyvonnia Dusky, MD, 50 mg at 06/26/20 1021 .  gabapentin (NEURONTIN) capsule 100 mg, 100 mg, Oral, Daily, Wyvonnia Dusky, MD, 100 mg at 06/26/20 1012 .  gabapentin (NEURONTIN) capsule 200 mg, 200 mg, Oral, QHS, Wyvonnia Dusky, MD .  ondansetron [facility]) tablet 4 mg, 4 mg, Oral, Q6H PRN **OR** ondansetron (ZOFRAN) injection 4 mg, 4 mg, Intravenous, Q6H PRN, Athena Masse, MD .  pantoprazole (PROTONIX) EC tablet 40 mg, 40 mg, Oral, Daily, Wyvonnia Dusky, MD, 40 mg at 06/26/20 1012 .  polyethylene glycol (MIRALAX / GLYCOLAX) packet 17 g, 17 g, Oral, Daily, Wyvonnia Dusky, MD, 17 g at 06/26/20 1013   Social History: Social History   Tobacco Use  . Smoking status: Former Smoker    Packs/day: 0.00    Years: 12.00    Pack years: 0.00    Types: Cigarettes  . Smokeless tobacco: Current User    Types: Chew  . Tobacco comment: quit over 45 years ago  Vaping Use  . Vaping Use: Never used  Substance Use Topics  . Alcohol use: Yes    Alcohol/week: 5.0 standard drinks    Types: 5 Cans of beer per week  . Drug use: No    Family Medical History: Family History  Problem Relation Age of Onset  . Heart disease Mother 16       CABG  . Hypertension Mother   . Heart attack Mother   . Hypertension Brother   . Heart disease Sister        stents  . Prostate cancer Brother   . Kidney cancer Brother   . Skin cancer Brother   . Hypertension Brother   . Bladder Cancer Sister   . Leukemia Father   . Stroke Father        multiple  . Cerebral aneurysm Sister   . Kidney cancer Brother     Physical Examination: Vitals:   06/26/20 0741 06/26/20 1118  BP: (!) 148/65 (!) 148/63  Pulse: 62 65  Resp: 18 18  Temp: 98.1 F (36.7 C) 98 F (36.7 C)  SpO2: 97% 97%     General: Patient is well developed, well nourished, calm, collected, and in no apparent distress.  Psychiatric: Patient is non-anxious.  Head:  Pupils equal, round, and reactive to  light.  ENT:  Oral mucosa appears well hydrated.  Neck:   Supple.  Full range of motion.  Respiratory: Patient is breathing without any difficulty.   NEUROLOGICAL:   Neurologic exam:  Mental status: alertness: alert, orientation: person, place, time, affect: normal Able to name 3/3 objects correctly. Able to repeat simple phrases and count backwards from 10 without difficulty.  Speech: fluent and clear Cranial nerves:  II: Visual fields are full by confrontation, no ptosis III/IV/VI: extra-ocular motions intact bilaterally V/VII:no evidence of facial droop or weakness and facial sensation intact VIII: hearing normal XI: trapezius strength symmetric,  sternocleidomastoid strength symmetric XII: tongue strength symmetric  Motor:strength symmetric 5/5, normal muscle  mass and tone in all extremities and no pronator drift Sensory: intact to light touch in all extremities Reflexes: 2+ and symmetric bilaterally for arms and legs Coordination: some ataxia with finger to nose test on left however he reports this is chronic and not new since fall Gait: normal, no difficulty with tandem gait ROM of spine: Full.  No TTP over C spine   Imaging: Repeat CT Head 06/26/2020: IMPRESSION: Stable small left greater than right subdural hematomas.  Repeat CT head 06/25/2020 2000:  IMPRESSION: Small bilateral parietal subdural hematomas, left slightly greater than right. The left subdural hematoma has enlarged slightly. No significant mass effect or midline shift.  CT head 06/25/2020:  IMPRESSION: 1. Small bilateral subdural hematomas. On the right this is along the parietal-occipital convexity measuring 5 mm, on the left frontoparietal convexity measuring 4 mm. Slight mass effect from right subdural hematoma but no midline shift. 2. No skull fracture. 3. Mild chronic small vessel ischemia.  CT Cervical spine 06/25/2020: IMPRESSION: Multilevel degenerative disc disease and facet  hypertrophy throughout the cervical spine without acute fracture or subluxation.  Assessment and Plan: Mr. Ian Sanders is a pleasant 79 y.o. male on chronic AC for atrial fibrillation (Xarelto 20mg  daily) with small bilateral hematomas after a fall approx 4-5 days ago.   His neurologic exam is non focal and reassuring.   His repeat head CT last night did show increase in L SDH from 74mm to 69mm. Xarelto was held.    This morning, his head CT remained stable from last night.  His neurologic exam remains unchanged since yesterday's exam.   Given that his repeat head CT was stable, no indication to transfuse platelets although he was noted to be thrombocytopenic last night.   Would recommend holding Xarelto at this time given the risk of major cerebral bleeding while on this medication, and in the setting of thrombocytopenia. We would favor holding this medication indefinitely however the patient will need to discuss this with his cardiologist in regard to risk vs benefit.  He should avoid NSAIDs and/or other blood thinning medications and maintain normotension.   He was notified of symptoms that would require further medical attention, including weakness on one side of the body, worsening dizziness or blurred vision, slurred speech, or any new neurologic deficits.  He is to follow-up in 3 weeks in clinic with a repeat head CT.  Lonell Face, NP Dept. of Neurosurgery

## 2020-06-26 NOTE — Progress Notes (Signed)
PROGRESS NOTE    Ian Sanders  NFA:[id number] DOB: 09-09-1941 DOA: 06/25/2020 PCP: Birdie Sons, MD    Assessment & Plan:   Principal Problem:   Traumatic subdural hematoma without loss of consciousness ([place]) Active Problems:   HTN (hypertension)   Type 2 diabetes mellitus with neurological complications (HCC)   Paroxysmal atrial fibrillation (HCC)   Cirrhosis of liver without ascites (HCC)   Chronic anticoagulation   Chronic idiopathic thrombocytopenia (HCC)   Traumatic bilateral subdural hematoma: fell on 06/19/2020. CT head on 06/25/2020 and again at 2000 showing slight enlargement and left subdural. Repeat CT head 06/26/20 shows stable b/l subdural hematoma. Continue w/ neuro checks. Per neurosurgery, okay to resume xarelto in 10 days however given thrombocytopenia, bleeding risk versus risk of stroke may be revisited.    PAF: continue to hold home dose of xarelto secondary to subdural hematoma. Continue on atenolol, flecainide   HTN: continue on atenolol, flecainide   Cirrhosis of liver: not on any meds for cirrhosis. Will continue to monitor   Chronic thrombocytopenia: etiology unclear. Recently seen by hematologist, Dr. Rogue Bussing   DVT prophylaxis: SCDs Code Status: full  Family Communication:  Disposition Plan: depends on PT/OT recs     Consultants:    Procedures:   Antimicrobials:   Subjective: Pt c/o malaise  Objective: Vitals:   06/26/20 0430 06/26/20 0510 06/26/20 0600 06/26/20 0741  BP: (!) 142/63 (!) 173/69 (!) 154/64 (!) 148/65  Pulse: (!) 56 60  62  Resp: 18   18  Temp:  98.3 F (36.8 C)  98.1 F (36.7 C)  TempSrc:  Oral    SpO2: 94% 100%  97%  Weight:      Height:       No intake or output data in the 24 hours ending 06/26/20 0759 Filed Weights   06/25/20 1244  Weight: 78 kg    Examination:  General exam: Appears calm and comfortable  Respiratory system: Clear to auscultation. Respiratory effort normal. Cardiovascular  system: S1 & S2 +. No rubs, gallops or clicks.  Gastrointestinal system: Abdomen is nondistended, soft and nontender. Normal bowel sounds heard. Central nervous system: Alert and oriented. Moves all 4 extremities Psychiatry: Judgement and insight appear normal. Normal mood and affect    Data Reviewed: I have personally reviewed following labs and imaging studies  CBC: Recent Labs  Lab 06/25/20 1611  WBC 2.4*  NEUTROABS 1.8  HGB 13.1  HCT 40.0  MCV 83.3  PLT 56*   Basic Metabolic Panel: Recent Labs  Lab 06/25/20 1611  NA 136  K 3.5  CL 95*  CO2 26  GLUCOSE 120*  BUN 7*  CREATININE 0.77  CALCIUM 9.4   GFR: Estimated Creatinine Clearance: 77.3 mL/min (by C-G formula based on SCr of 0.77 mg/dL). Liver Function Tests: Recent Labs  Lab 06/25/20 1611  AST 30  ALT 17  ALKPHOS 86  BILITOT 1.6*  PROT 7.6  ALBUMIN 4.6   No results for input(s): LIPASE, AMYLASE in the last 168 hours. No results for input(s): AMMONIA in the last 168 hours. Coagulation Profile: Recent Labs  Lab 06/25/20 1611  INR 1.8*   Cardiac Enzymes: No results for input(s): CKTOTAL, CKMB, CKMBINDEX, TROPONINI in the last 168 hours. BNP (last 3 results) No results for input(s): PROBNP in the last 8760 hours. HbA1C: No results for input(s): HGBA1C in the last 72 hours. CBG: No results for input(s): GLUCAP in the last 168 hours. Lipid Profile: No results for input(s): CHOL, HDL,  LDLCALC, TRIG, CHOLHDL, LDLDIRECT in the last 72 hours. Thyroid Function Tests: No results for input(s): TSH, T4TOTAL, FREET4, T3FREE, THYROIDAB in the last 72 hours. Anemia Panel: No results for input(s): VITAMINB12, FOLATE, FERRITIN, TIBC, IRON, RETICCTPCT in the last 72 hours. Sepsis Labs: No results for input(s): PROCALCITON, LATICACIDVEN in the last 168 hours.  Recent Results (from the past 240 hour(s))  SARS Coronavirus 2 by RT PCR (hospital order, performed in [facility] hospital lab) Nasopharyngeal  Nasopharyngeal Swab     Status: None   Collection Time: 06/25/20  4:43 AM   Specimen: Nasopharyngeal Swab  Result Value Ref Range Status   SARS Coronavirus 2 NEGATIVE NEGATIVE Final    Comment: (NOTE) SARS-CoV-2 target nucleic acids are NOT DETECTED.  The SARS-CoV-2 RNA is generally detectable in upper and lower respiratory specimens during the acute phase of infection. The lowest concentration of SARS-CoV-2 viral copies this assay can detect is 250 copies / mL. A negative result does not preclude SARS-CoV-2 infection and should not be used as the sole basis for treatment or other patient management decisions.  A negative result may occur with improper specimen collection / handling, submission of specimen other than nasopharyngeal swab, presence of viral mutation(s) within the areas targeted by this assay, and inadequate number of viral copies (<250 copies / mL). A negative result must be combined with clinical observations, patient history, and epidemiological information.  Fact Sheet for Patients:   StrictlyIdeas.no  Fact Sheet for Healthcare Providers: BankingDealers.co.za  This test is not yet approved or  cleared by the [place] FDA and has been authorized for detection and/or diagnosis of SARS-CoV-2 by FDA under an Emergency Use Authorization (EUA).  This EUA will remain in effect (meaning this test can be used) for the duration of the COVID-19 declaration under Section 564(b)(1) of the Act, 21 U.S.C. section 360bbb-3(b)(1), unless the authorization is terminated or revoked sooner.  Performed at [facility], [address], [place], [place] [address]          Radiology Studies: CT Head Wo Contrast  Result Date: 06/25/2020 CLINICAL DATA:  Headache EXAM: CT HEAD WITHOUT CONTRAST TECHNIQUE: Contiguous axial images were obtained from the base of the skull through the vertex without intravenous contrast.  COMPARISON:  06/25/2020 FINDINGS: Brain: Left parietal subdural hematoma again noted. This measures 8 mm in thickness compared to 5 mm previously. Small right posterior parietal subdural hematoma measuring 4 mm, stable. No mass effect or midline shift. No acute infarct or hydrocephalus. Vascular: No hyperdense vessel or unexpected calcification. Skull: No acute calvarial abnormality. Sinuses/Orbits: Visualized paranasal sinuses and mastoids clear. Orbital soft tissues unremarkable. Other: None IMPRESSION: Small bilateral parietal subdural hematomas, left slightly greater than right. The left subdural hematoma has enlarged slightly. No significant mass effect or midline shift. Electronically Signed   By: Rolm Baptise M.D.   On: 06/25/2020 20:14   CT Head Wo Contrast  Result Date: 06/25/2020 CLINICAL DATA:  Headache, intracranial hemorrhage suspected Headache after fall Fall last week. Headache and dizziness. Blurry vision. On anticoagulation. EXAM: CT HEAD WITHOUT CONTRAST TECHNIQUE: Contiguous axial images were obtained from the base of the skull through the vertex without intravenous contrast. COMPARISON:  Head CT 01/14/2018 FINDINGS: Brain: Thin subdural hematoma is most prominent along the right parietooccipital convexity and measure up to 5 mm in depth, series 4, image 56. Slight adjacent mass effect. Thin left frontoparietal subdural measures up to 4 mm (series 4 image #51). No mass effect from left subdural  hemorrhage. There is no midline shift. No subarachnoid or intraparenchymal bleed. There is no hydrocephalus. Basilar cisterns are patent. Mild chronic small vessel ischemia again seen. Tiny remote lacunar infarct in the left thalamus versus prominent perivascular space. Vascular: Atherosclerosis of skullbase vasculature without hyperdense vessel or abnormal calcification. Skull: No skull fracture, particularly in the region of subdural bleed. Sinuses/Orbits: Paranasal sinuses and mastoid air cells are  clear. The visualized orbits are unremarkable. Other: None. IMPRESSION: 1. Small bilateral subdural hematomas. On the right this is along the parietal-occipital convexity measuring 5 mm, on the left frontoparietal convexity measuring 4 mm. Slight mass effect from right subdural hematoma but no midline shift. 2. No skull fracture. 3. Mild chronic small vessel ischemia. Critical Value/emergent results were called by telephone at the time of interpretation on 06/25/2020 at 2:03 pm to Dr Conni Slipper , who verbally acknowledged these results. Electronically Signed   By: Keith Rake M.D.   On: 06/25/2020 14:04   CT Cervical Spine Wo Contrast  Result Date: 06/25/2020 CLINICAL DATA:  Neck trauma (Age >= 65y) Fall last week. Headache and dizziness. Blurry vision. On anticoagulation. EXAM: CT CERVICAL SPINE WITHOUT CONTRAST TECHNIQUE: Multidetector CT imaging of the cervical spine was performed without intravenous contrast. Multiplanar CT image reconstructions were also generated. COMPARISON:  None. FINDINGS: Alignment: Trace degenerative anterolisthesis of C5 on C6. No traumatic subluxation. Skull base and vertebrae: No acute fracture. Vertebral body heights are maintained. The dens and skull base are intact. Soft tissues and spinal canal: No prevertebral fluid or swelling. No visible canal hematoma. Disc levels: Diffuse disc space narrowing and endplate spurring from V3-Z4 through C7-T1, most prominent at C4-C5. There is multilevel facet hypertrophy. Multilevel neural foraminal stenosis but no central canal narrowing. Upper chest: Negative. Other: Carotid calcifications. 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 Electronically Signed   By: Keith Rake M.D.   On: 06/25/2020 13:54        Scheduled Meds: Continuous Infusions:   LOS: 0 days    Time spent: 34 mins     Wyvonnia Dusky, MD [facility] Pager [phone]  If 7PM-7AM, please contact night-coverage [email or web address] 06/26/2020, 7:59 AM

## 2020-06-26 NOTE — Evaluation (Signed)
Physical Therapy Evaluation Patient Details Name: Ian Sanders MRN: [id number] DOB: September 03, 1941 Today's Date: 06/26/2020   History of Present Illness  Pt is a 79 y.o. male with medical history significant for paroxysmal A. fib, on Xarelto, cirrhosis and chronic thrombocytopenia recently seen by hematology, HTN, history of TIA, who presents to the emergency room with 1 day history of severe headaches associated with dizziness and visual disturbance.  Reported having fallen a week ago around 06/19/2020 but not seeking medical attention.  States he lost his footing while walking down some stairs hitting his head on a plastic lawn chair.  CT imaging showed a small bilateral subdural hematoma.    Clinical Impression  Pt alert, agreeable to PT, sitting in chair with OT in room. The patient endorsed at baseline he uses a [facility] for community ambulation, no AD in home. Pt endorsed 2 falls in the last 6 months one which led to this admission.  The patient demonstrated sit <> Stand with modI, good use of hands for support. He ambulated ~168ft with SPC and CGA. The patient did exhibit decreased gait velocity, decreased step height/length, and exhibited unsteadiness with head turns/scanning environment while walking. No overt LOB noted but pt endorsed he did not feel returned to his baseline level of mobility. Family entered room and pt returned to bed at his request to allow family to sit in recliner, agreeable to mobilize to chair for dinner. Overall the patient demonstrated deficits (see "PT Problem List") that impede the patient's functional abilities, safety, and mobility and would benefit from skilled PT intervention. Recommendation is Outpatient PT to maximize function and decrease risk of falls.     Follow Up Recommendations Outpatient PT    Equipment Recommendations  None recommended by PT    Recommendations for Other Services       Precautions / Restrictions Precautions Precautions:  Fall Restrictions Weight Bearing Restrictions: No      Mobility  Bed Mobility Overal bed mobility: Modified Independent                Transfers Overall transfer level: Needs assistance Equipment used: Straight cane Transfers: Sit to/from Stand Sit to Stand: Supervision            Ambulation/Gait Ambulation/Gait assistance: Min guard Gait Distance (Feet): 150 Feet Assistive device: Straight cane       General Gait Details: Pt with decreased gait velocity, decreased step height/length, and exhibited unsteadiness with head turns/scanning environment while walking.  Stairs            Wheelchair Mobility    Modified Rankin (Stroke Patients Only)       Balance Overall balance assessment: Needs assistance Sitting-balance support: Feet supported Sitting balance-Leahy Scale: Good     Standing balance support: Single extremity supported Standing balance-Leahy Scale: Fair                               Pertinent Vitals/Pain Pain Assessment: No/denies pain    Home Living Family/patient expects to be discharged to:: Private residence Living Arrangements: Spouse/significant other Available Help at Discharge: Family Type of Home: House         Home Equipment: Kasandra Knudsen - single point;Walker - 2 wheels      Prior Function Level of Independence: Independent               Hand Dominance   Dominant Hand: Right    Extremity/Trunk Assessment   Upper  Extremity Assessment Upper Extremity Assessment: Overall WFL for tasks assessed;Defer to OT evaluation    Lower Extremity Assessment Lower Extremity Assessment: Overall WFL for tasks assessed (strength grossly 4/5 bilaterally)    Cervical / Trunk Assessment Cervical / Trunk Assessment: Normal  Communication   Communication: No difficulties  Cognition Arousal/Alertness: Awake/alert Behavior During Therapy: WFL for tasks assessed/performed Overall Cognitive Status: Within Functional  Limits for tasks assessed                                        General Comments      Exercises Other Exercises Other Exercises: Family entered room, pt requested to return to bed so wife could sit up in recliner. Agreeable to return to chair for dinner.   Assessment/Plan    PT Assessment Patient needs continued PT services  PT Problem List Decreased strength;Decreased mobility;Decreased activity tolerance;Decreased balance;Decreased knowledge of use of DME       PT Treatment Interventions DME instruction;Therapeutic exercise;Gait training;Balance training;Stair training;Neuromuscular re-education;Functional mobility training;Therapeutic activities;Patient/family education    PT Goals (Current goals can be found in the Care Plan section)  Acute Rehab PT Goals Patient Stated Goal: to go home PT Goal Formulation: With patient Time For Goal Achievement: 07/10/20 Potential to Achieve Goals: Good    Frequency Min 2X/week   Barriers to discharge        Co-evaluation               AM-PAC PT "6 Clicks" Mobility  Outcome Measure Help needed turning from your back to your side while in a flat bed without using bedrails?: None Help needed moving from lying on your back to sitting on the side of a flat bed without using bedrails?: None Help needed moving to and from a bed to a chair (including a wheelchair)?: None Help needed standing up from a chair using your arms (e.g., wheelchair or bedside chair)?: None Help needed to walk in hospital room?: A Little Help needed climbing 3-5 steps with a railing? : A Little 6 Click Score: 22    End of Session Equipment Utilized During Treatment: Gait belt Activity Tolerance: Patient tolerated treatment well Patient left: in bed;with call bell/phone within reach;with bed alarm set Nurse Communication: Mobility status PT Visit Diagnosis: Other abnormalities of gait and mobility (R26.89);History of falling  (Z91.81);Difficulty in walking, not elsewhere classified (R26.2);Unsteadiness on feet (R26.81)    Time: 0037-0488 PT Time Calculation (min) (ACUTE ONLY): 16 min   Charges:   PT Evaluation $PT Eval Low Complexity: 1 Low          Lieutenant Diego PT, DPT 2:27 PM,06/26/20

## 2020-06-27 DIAGNOSIS — I480 Paroxysmal atrial fibrillation: Secondary | ICD-10-CM

## 2020-06-27 DIAGNOSIS — S065X0A Traumatic subdural hemorrhage without loss of consciousness, initial encounter: Secondary | ICD-10-CM | POA: Diagnosis not present

## 2020-06-27 DIAGNOSIS — I10 Essential (primary) hypertension: Secondary | ICD-10-CM | POA: Diagnosis not present

## 2020-06-27 DIAGNOSIS — D693 Immune thrombocytopenic purpura: Secondary | ICD-10-CM | POA: Diagnosis not present

## 2020-06-27 LAB — BASIC METABOLIC PANEL
Anion gap: 11 (ref 5–15)
BUN: 9 mg/dL (ref 8–23)
CO2: 28 mmol/L (ref 22–32)
Calcium: 9.1 mg/dL (ref 8.9–10.3)
Chloride: 97 mmol/L — ABNORMAL LOW (ref 98–111)
Creatinine, Ser: 0.71 mg/dL (ref 0.61–1.24)
GFR calc Af Amer: >60 mL/min (ref >60)
GFR calc non Af Amer: >60 mL/min (ref >60)
Glucose, Bld: 99 mg/dL (ref 70–99)
Potassium: 3.1 mmol/L — ABNORMAL LOW (ref 3.5–5.1)
Sodium: 136 mmol/L (ref 135–145)

## 2020-06-27 LAB — CBC
HCT: 35.4 % — ABNORMAL LOW (ref 39.0–52.0)
Hemoglobin: 11.9 g/dL — ABNORMAL LOW (ref 13.0–17.0)
MCH: 27.9 pg (ref 26.0–34.0)
MCHC: 33.6 g/dL (ref 30.0–36.0)
MCV: 83.1 fL (ref 80.0–100.0)
Platelets: 59 10*3/uL — ABNORMAL LOW (ref 150–400)
RBC: 4.26 MIL/uL (ref 4.22–5.81)
RDW: 13.2 % (ref 11.5–15.5)
WBC: 2.4 10*3/uL — ABNORMAL LOW (ref 4.0–10.5)
nRBC: 0 % (ref 0.0–0.2)

## 2020-06-27 MED ORDER — RIVAROXABAN 20 MG PO TABS: 20.0000 mg | ORAL_TABLET | Freq: Every day | ORAL | 11 refills | Status: DC

## 2020-06-27 MED ORDER — POTASSIUM CHLORIDE CRYS ER 20 MEQ PO TBCR
40.0000 meq | EXTENDED_RELEASE_TABLET | Freq: Once | ORAL | Status: AC
  Administered 2020-06-27: 40 meq via ORAL
  Filled 2020-06-27: qty 2

## 2020-06-27 NOTE — Plan of Care (Signed)

## 2020-06-27 NOTE — Progress Notes (Signed)
Patient given discharge instructions with wife at bedside. IV taken out and tele monitor taken off. Patient verbalized understanding without any question or concerns. Patient verbalizes having an appointment with cardiology.

## 2020-06-27 NOTE — Discharge Summary (Signed)
Physician Discharge Summary  Ian Sanders TFT:[id number] DOB: Sep 20, 1941 DOA: 06/25/2020  PCP: Ian Sons, MD  Admit date: 06/25/2020 Discharge date: 06/27/2020  Admitted From: home  Disposition:  Home   Recommendations for Outpatient Follow-up:  1. Follow up with PCP in 1 week 2. F/u neurosurg, NP Ian Sanders, in 3 weeks  3. F/u cardio in 1 week   Home Health: no; outpatient PT Equipment/Devices:  Discharge Condition: stable CODE STATUS: full  Diet recommendation: Heart Healthy / Carb Modified    Brief/Interim Summary: HPI was taken from Dr. Damita Sanders: Ian Sanders is a 79 y.o. male with medical history significant for paroxysmal A. fib, on Xarelto, cirrhosis and chronic thrombocytopenia recently seen by hematology, HTN, history of TIA, who presents to the emergency room with 1 day history of severe headaches associated with dizziness and visual disturbance.  Reported having fallen a week ago around 06/19/2020 but not seeking medical attention.  States he lost his footing while walking down some stairs hitting his head on a plastic lawn chair.  CT imaging showed a small bilateral subdural hematoma.  The emergency room provider spoke with neurosurgical nurse practitioner on-call who recommended observation in the ER and repeating head CT in 6 hours with plans to discharge with resumption of Xarelto if no change.  CT head was repeated in 6 hours but showed slight increase in left subdural hematoma.  The emergency room provider again contacted neurosurgeon, Dr. Lacinda Sanders who recommended overnight observation with plans to hold Xarelto for 10 days.  Hospitalist consulted for admission.  Platelets 56,000.  Hospital course from Dr. Lenise Sanders 8/11-8/12/21: Pt was found to have traumatic b/l subdural hematoma secondary to fall at home. Xarelto was held and will be continued to be held until the pt discusses it further with her cardiologist. Pt verbalized his understanding. Repeat CT head on day prior to  d/c, b/l hematomas were stable. Pt will f/u neurosurgery, NP Ian Sanders, in 3 weeks for repeat CT head. Pt verbalized his understanding. Pt was told of symptoms that would require further medical attention, including weakness on one side of the body, worsening dizziness or blurred vision, slurred speech, or any new neurologic deficits. Pt verbalized his understanding. PT/OT saw the pt and recommended outpatient PT only. Outpatient PT referral will be taken care of by CM. For more information, please see previous progress notes.   Discharge Diagnoses:  Principal Problem:   Traumatic subdural hematoma without loss of consciousness (HCC) Active Problems:   HTN (hypertension)   Type 2 diabetes mellitus with neurological complications (HCC)   Paroxysmal atrial fibrillation (HCC)   Cirrhosis of liver without ascites (HCC)   Chronic anticoagulation   Chronic idiopathic thrombocytopenia (HCC)  Traumatic bilateralsubdural hematoma: fell on 06/19/2020. CT head on 06/25/2020 and again at 2000 showing slight enlargement and left subdural. Repeat CT head 06/26/20 shows stable b/l subdural hematoma. Continue w/ neuro checks. Per neurosurgery, okay to resume xarelto in 10 days however given thrombocytopenia, bleeding risk versus risk of stroke may be revisited by pt's cardiologist.  PAF: continue to hold home dose of xarelto secondary to subdural hematoma. Continue on atenolol, flecainide   Hypokalemia: KCl repleted. Will continue to monitor   HTN: continue on atenolol, flecainide   Cirrhosis of liver: not on any meds for cirrhosis. Will continue to monitor   Chronic thrombocytopenia: etiology unclear. Recently seen by hematologist, Dr. Rogue Sanders  Discharge Instructions  Discharge Instructions    Diet - low sodium heart healthy   Complete by:  As directed    Discharge instructions   Complete by: As directed    Follow up w/ PCP in 1-2 weeks. Follow up with cardiology in 1 week. Follow-up neuro surg, Dr.  Lacinda Sanders or NP Ian Sanders, in 3 weeks for repeat CT head. Do not take xarelto until the risks/benefits are discussed with your cardiologist.   Increase activity slowly   Complete by: As directed      Allergies as of 06/27/2020      Reactions   Lisinopril Swelling   Tongue swelling Tongue swelling  Tongue swelling Tongue swelling  Tongue swelling    Myrbetriq [mirabegron] Swelling   Of the tongue   Atorvastatin Other (See Comments)   Dizziness and Fatigue Dizziness and Fatigue   Dabigatran Etexilate Mesylate Other (See Comments)   Stomach ulcers (Pradaxa)   Dabigatran Etexilate Mesylate Other (See Comments)   Stomach ulcers (Pradaxa)      Medication List    STOP taking these medications   atenolol 50 MG tablet Commonly known as: TENORMIN     TAKE these medications   amLODipine 5 MG tablet Commonly known as: NORVASC Take 5 mg by mouth daily.   Biotin 10 MG Tabs Take 10 mg by mouth daily.   calcium-vitamin D 250-125 MG-UNIT tablet Commonly known as: OSCAL Take 1 tablet by mouth 2 (two) times daily.   chlorthalidone 25 MG tablet Commonly known as: HYGROTON Take 1 tablet (25 mg total) by mouth daily. What changed: when to take this   diltiazem 30 MG tablet Commonly known as: CARDIZEM Take 1 tablet (30 mg total) by mouth once daily as needed (palpitations)   EpiPen 2-Pak 0.3 mg/0.3 mL Soaj injection Generic drug: EPINEPHrine Inject 0.3 mg into the muscle once.   flecainide 50 MG tablet Commonly known as: TAMBOCOR Take 1 tablet (50 mg total) by mouth 2 (two) times daily.   gabapentin 100 MG capsule Commonly known as: NEURONTIN Take 1 capsule (100 mg total) by mouth 3 (three) times daily. What changed:   when to take this  additional instructions   Leuprolide Acetate (6 Month) 45 MG injection Commonly known as: LUPRON Inject 45 mg into the muscle every 6 (six) months.   omeprazole 40 MG capsule Commonly known as: PRILOSEC TAKE ONE CAPSULE DAILY.   rivaroxaban  20 MG Tabs tablet Commonly known as: XARELTO Take 1 tablet (20 mg total) by mouth daily. Do not take this medication until risks/benefits are discussed w/ the cardiologist. What changed: additional instructions   tolterodine 4 MG 24 hr capsule Commonly known as: Detrol LA Take 1 capsule (4 mg total) by mouth daily.   VITAMIN B-12 PO Take by mouth daily.   zolpidem 10 MG tablet Commonly known as: AMBIEN Take 0.5-1 tablets (5-10 mg total) by mouth at bedtime as needed.       Follow-up Information    Ian Sons, MD On 07/17/2020.   Specialty: Family Medicine Why: [facility] [employer] information: [address] [place] [address] [phone]        Lonell Face, NP On 07/18/2020.   Specialty: Neurosurgery Why: will need a repeat CT head...9:15am Contact information: [place] [address] [phone]              Allergies  Allergen Reactions  . Lisinopril Swelling    Tongue swelling Tongue swelling  Tongue swelling Tongue swelling  Tongue swelling   . Myrbetriq [Mirabegron] Swelling    Of the tongue  .  Atorvastatin Other (See Comments)    Dizziness and Fatigue  Dizziness and Fatigue  . Dabigatran Etexilate Mesylate Other (See Comments)    Stomach ulcers (Pradaxa)  . Dabigatran Etexilate Mesylate Other (See Comments)    Stomach ulcers (Pradaxa)    Consultations:  Neurosurgery    Procedures/Studies: CT HEAD WO CONTRAST  Result Date: 06/26/2020 CLINICAL DATA:  Follow-up CT scan for left parietal hematoma. EXAM: CT HEAD WITHOUT CONTRAST TECHNIQUE: Contiguous axial images were obtained from the base of the skull through the vertex without intravenous contrast. COMPARISON:  CT head 06/25/2020 FINDINGS: Brain: Stable left parietal low-density thin subdural hematoma measuring 8 mm in thickness. Stable high density small right posterior subdural hematoma measuring 4 mm. No mass effect or midline shift. No  evidence of acute infarction or hydrocephalus. No evidence of mass lesion. Vascular: No hyperdense vessel or unexpected calcification. Skull: Normal. Negative for fracture or focal lesion. Sinuses/Orbits: No acute finding. Other: None. IMPRESSION: Stable small left greater than right subdural hematomas. Electronically Signed   By: Audie Pinto M.D.   On: 06/26/2020 08:32   CT Head Wo Contrast  Result Date: 06/25/2020 CLINICAL DATA:  Headache EXAM: CT HEAD WITHOUT CONTRAST TECHNIQUE: Contiguous axial images were obtained from the base of the skull through the vertex without intravenous contrast. COMPARISON:  06/25/2020 FINDINGS: Brain: Left parietal subdural hematoma again noted. This measures 8 mm in thickness compared to 5 mm previously. Small right posterior parietal subdural hematoma measuring 4 mm, stable. No mass effect or midline shift. No acute infarct or hydrocephalus. Vascular: No hyperdense vessel or unexpected calcification. Skull: No acute calvarial abnormality. Sinuses/Orbits: Visualized paranasal sinuses and mastoids clear. Orbital soft tissues unremarkable. Other: None IMPRESSION: Small bilateral parietal subdural hematomas, left slightly greater than right. The left subdural hematoma has enlarged slightly. No significant mass effect or midline shift. Electronically Signed   By: Rolm Baptise M.D.   On: 06/25/2020 20:14   CT Head Wo Contrast  Result Date: 06/25/2020 CLINICAL DATA:  Headache, intracranial hemorrhage suspected Headache after fall Fall last week. Headache and dizziness. Blurry vision. On anticoagulation. EXAM: CT HEAD WITHOUT CONTRAST TECHNIQUE: Contiguous axial images were obtained from the base of the skull through the vertex without intravenous contrast. COMPARISON:  Head CT 01/14/2018 FINDINGS: Brain: Thin subdural hematoma is most prominent along the right parietooccipital convexity and measure up to 5 mm in depth, series 4, image 56. Slight adjacent mass effect. Thin  left frontoparietal subdural measures up to 4 mm (series 4 image #51). No mass effect from left subdural hemorrhage. There is no midline shift. No subarachnoid or intraparenchymal bleed. There is no hydrocephalus. Basilar cisterns are patent. Mild chronic small vessel ischemia again seen. Tiny remote lacunar infarct in the left thalamus versus prominent perivascular space. Vascular: Atherosclerosis of skullbase vasculature without hyperdense vessel or abnormal calcification. Skull: No skull fracture, particularly in the region of subdural bleed. Sinuses/Orbits: Paranasal sinuses and mastoid air cells are clear. The visualized orbits are unremarkable. Other: None. IMPRESSION: 1. Small bilateral subdural hematomas. On the right this is along the parietal-occipital convexity measuring 5 mm, on the left frontoparietal convexity measuring 4 mm. Slight mass effect from right subdural hematoma but no midline shift. 2. No skull fracture. 3. Mild chronic small vessel ischemia. Critical Value/emergent results were called by telephone at the time of interpretation on 06/25/2020 at 2:03 pm to Dr Conni Slipper , who verbally acknowledged these results. Electronically Signed   By: Keith Rake M.D.   On: 06/25/2020  14:04   CT Cervical Spine Wo Contrast  Result Date: 06/25/2020 CLINICAL DATA:  Neck trauma (Age >= 65y) Fall last week. Headache and dizziness. Blurry vision. On anticoagulation. EXAM: CT CERVICAL SPINE WITHOUT CONTRAST TECHNIQUE: Multidetector CT imaging of the cervical spine was performed without intravenous contrast. Multiplanar CT image reconstructions were also generated. COMPARISON:  None. FINDINGS: Alignment: Trace degenerative anterolisthesis of C5 on C6. No traumatic subluxation. Skull base and vertebrae: No acute fracture. Vertebral body heights are maintained. The dens and skull base are intact. Soft tissues and spinal canal: No prevertebral fluid or swelling. No visible canal hematoma. Disc levels:  Diffuse disc space narrowing and endplate spurring from A3-F5 through C7-T1, most prominent at C4-C5. There is multilevel facet hypertrophy. Multilevel neural foraminal stenosis but no central canal narrowing. Upper chest: Negative. Other: Carotid calcifications. 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 Electronically Signed   By: Keith Rake M.D.   On: 06/25/2020 13:54      Subjective: Pt c/o fatigue   Discharge Exam: Vitals:   06/27/20 0726 06/27/20 1108  BP: 130/61 137/66  Pulse: (!) 58 67  Resp: 17 17  Temp: 98 F (36.7 C) 98.8 F (37.1 C)  SpO2: 97% 94%   Vitals:   06/26/20 2050 06/27/20 0431 06/27/20 0726 06/27/20 1108  BP: (!) 178/79 (!) 152/71 130/61 137/66  Pulse: 70 65 (!) 58 67  Resp: 20 20 17 17   Temp: 99 F (37.2 C) 98.4 F (36.9 C) 98 F (36.7 C) 98.8 F (37.1 C)  TempSrc: Oral Oral Oral Oral  SpO2: 98% 99% 97% 94%  Weight:      Height:        General: Pt is alert, awake, not in acute distress Cardiovascular:  S1/S2 +, no rubs, no gallops Respiratory: CTA bilaterally, no wheezing, no rhonchi Abdominal: Soft, NT, ND, bowel sounds + Extremities: no edema, no cyanosis    The results of significant diagnostics from this hospitalization (including imaging, microbiology, ancillary and laboratory) are listed below for reference.     Microbiology: Recent Results (from the past 240 hour(s))  SARS Coronavirus 2 by RT PCR (hospital order, performed in [facility] hospital lab) Nasopharyngeal Nasopharyngeal Swab     Status: None   Collection Time: 06/25/20  4:43 AM   Specimen: Nasopharyngeal Swab  Result Value Ref Range Status   SARS Coronavirus 2 NEGATIVE NEGATIVE Final    Comment: (NOTE) SARS-CoV-2 target nucleic acids are NOT DETECTED.  The SARS-CoV-2 RNA is generally detectable in upper and lower respiratory specimens during the acute phase of  infection. The lowest concentration of SARS-CoV-2 viral copies this assay can detect is 250 copies / mL. A negative result does not preclude SARS-CoV-2 infection and should not be used as the sole basis for treatment or other patient management decisions.  A negative result may occur with improper specimen collection / handling, submission of specimen other than nasopharyngeal swab, presence of viral mutation(s) within the areas targeted by this assay, and inadequate number of viral copies (<250 copies / mL). A negative result must be combined with clinical observations, patient history, and epidemiological information.  Fact Sheet for Patients:   StrictlyIdeas.no  Fact Sheet for Healthcare Providers: BankingDealers.co.za  This test is not yet approved or  cleared by the [place] FDA and has been authorized for detection and/or diagnosis of SARS-CoV-2 by FDA under an Emergency Use Authorization (EUA).  This EUA will remain in  effect (meaning this test can be used) for the duration of the COVID-19 declaration under Section 564(b)(1) of the Act, 21 U.S.C. section 360bbb-3(b)(1), unless the authorization is terminated or revoked sooner.  Performed at [facility], [address] [place], [place] [address]      Labs: BNP (last 3 results) No results for input(s): BNP in the last 8760 hours. Basic Metabolic Panel: Recent Labs  Lab 06/25/20 1611 06/27/20 0632  NA 136 136  K 3.5 3.1*  CL 95* 97*  CO2 26 28  GLUCOSE 120* 99  BUN 7* 9  CREATININE 0.77 0.71  CALCIUM 9.4 9.1   Liver Function Tests: Recent Labs  Lab 06/25/20 1611  AST 30  ALT 17  ALKPHOS 86  BILITOT 1.6*  PROT 7.6  ALBUMIN 4.6   No results for input(s): LIPASE, AMYLASE in the last 168 hours. No results for input(s): AMMONIA in the last 168 hours. CBC: Recent Labs  Lab 06/25/20 1611 06/27/20 0632  WBC 2.4* 2.4*  NEUTROABS 1.8  --   HGB  13.1 11.9*  HCT 40.0 35.4*  MCV 83.3 83.1  PLT 56* 59*   Cardiac Enzymes: No results for input(s): CKTOTAL, CKMB, CKMBINDEX, TROPONINI in the last 168 hours. BNP: Invalid input(s): POCBNP CBG: No results for input(s): GLUCAP in the last 168 hours. D-Dimer No results for input(s): DDIMER in the last 72 hours. Hgb A1c No results for input(s): HGBA1C in the last 72 hours. Lipid Profile No results for input(s): CHOL, HDL, LDLCALC, TRIG, CHOLHDL, LDLDIRECT in the last 72 hours. Thyroid function studies No results for input(s): TSH, T4TOTAL, T3FREE, THYROIDAB in the last 72 hours.  Invalid input(s): FREET3 Anemia work up No results for input(s): VITAMINB12, FOLATE, FERRITIN, TIBC, IRON, RETICCTPCT in the last 72 hours. Urinalysis    Component Value Date/Time   COLORURINE YELLOW (A) 05/09/2020 1946   APPEARANCEUR CLEAR (A) 05/09/2020 1946   APPEARANCEUR Clear 07/27/2019 1319   LABSPEC 1.008 05/09/2020 1946   LABSPEC 1.009 08/10/2012 1219   PHURINE 7.0 05/09/2020 1946   GLUCOSEU NEGATIVE 05/09/2020 1946   GLUCOSEU Negative 08/10/2012 1219   HGBUR NEGATIVE 05/09/2020 1946   BILIRUBINUR NEGATIVE 05/09/2020 1946   BILIRUBINUR Negative 07/27/2019 1319   BILIRUBINUR Negative 08/10/2012 1219   KETONESUR NEGATIVE 05/09/2020 1946   PROTEINUR NEGATIVE 05/09/2020 1946   UROBILINOGEN 0.2 04/27/2017 0949   NITRITE NEGATIVE 05/09/2020 1946   LEUKOCYTESUR NEGATIVE 05/09/2020 1946   LEUKOCYTESUR Negative 08/10/2012 1219   Sepsis Labs Invalid input(s): PROCALCITONIN,  WBC,  LACTICIDVEN Microbiology Recent Results (from the past 240 hour(s))  SARS Coronavirus 2 by RT PCR (hospital order, performed in [place] hospital lab) Nasopharyngeal Nasopharyngeal Swab     Status: None   Collection Time: 06/25/20  4:43 AM   Specimen: Nasopharyngeal Swab  Result Value Ref Range Status   SARS Coronavirus 2 NEGATIVE NEGATIVE Final    Comment: (NOTE) SARS-CoV-2 target nucleic acids are NOT  DETECTED.  The SARS-CoV-2 RNA is generally detectable in upper and lower respiratory specimens during the acute phase of infection. The lowest concentration of SARS-CoV-2 viral copies this assay can detect is 250 copies / mL. A negative result does not preclude SARS-CoV-2 infection and should not be used as the sole basis for treatment or other patient management decisions.  A negative result may occur with improper specimen collection / handling, submission of specimen other than nasopharyngeal swab, presence of viral mutation(s) within the areas targeted by this assay, and inadequate number of viral  copies (<250 copies / mL). A negative result must be combined with clinical observations, patient history, and epidemiological information.  Fact Sheet for Patients:   StrictlyIdeas.no  Fact Sheet for Healthcare Providers: BankingDealers.co.za  This test is not yet approved or  cleared by the [place] FDA and has been authorized for detection and/or diagnosis of SARS-CoV-2 by FDA under an Emergency Use Authorization (EUA).  This EUA will remain in effect (meaning this test can be used) for the duration of the COVID-19 declaration under Section 564(b)(1) of the Act, 21 U.S.C. section 360bbb-3(b)(1), unless the authorization is terminated or revoked sooner.  Performed at [facility], [address], [place], [place] [address]      Time coordinating discharge: Over 30 minutes  SIGNED:   Wyvonnia Dusky, MD  [facility] 06/27/2020, 2:01 PM Pager   If 7PM-7AM, please contact night-coverage [email or web address]

## 2020-06-28 ENCOUNTER — Other Ambulatory Visit: Payer: Self-pay | Admitting: Nurse Practitioner

## 2020-06-28 DIAGNOSIS — S065X9A Traumatic subdural hemorrhage with loss of consciousness of unspecified duration, initial encounter: Secondary | ICD-10-CM

## 2020-06-28 DIAGNOSIS — S065XAA Traumatic subdural hemorrhage with loss of consciousness status unknown, initial encounter: Secondary | ICD-10-CM

## 2020-07-17 ENCOUNTER — Other Ambulatory Visit: Payer: Self-pay

## 2020-07-17 ENCOUNTER — Encounter: Payer: Self-pay | Admitting: Family Medicine

## 2020-07-17 ENCOUNTER — Ambulatory Visit (INDEPENDENT_AMBULATORY_CARE_PROVIDER_SITE_OTHER): Payer: PPO | Admitting: Family Medicine

## 2020-07-17 VITALS — BP 166/72 | HR 64 | Temp 99.2°F | Resp 16 | Wt 171.0 lb

## 2020-07-17 DIAGNOSIS — E1149 Type 2 diabetes mellitus with other diabetic neurological complication: Secondary | ICD-10-CM

## 2020-07-17 DIAGNOSIS — S065X0A Traumatic subdural hemorrhage without loss of consciousness, initial encounter: Secondary | ICD-10-CM

## 2020-07-17 DIAGNOSIS — D731 Hypersplenism: Secondary | ICD-10-CM

## 2020-07-17 DIAGNOSIS — R161 Splenomegaly, not elsewhere classified: Secondary | ICD-10-CM | POA: Diagnosis not present

## 2020-07-17 DIAGNOSIS — R079 Chest pain, unspecified: Secondary | ICD-10-CM

## 2020-07-17 DIAGNOSIS — I700 Atherosclerosis of aorta: Secondary | ICD-10-CM

## 2020-07-17 DIAGNOSIS — I480 Paroxysmal atrial fibrillation: Secondary | ICD-10-CM

## 2020-07-17 DIAGNOSIS — D61818 Other pancytopenia: Secondary | ICD-10-CM | POA: Diagnosis not present

## 2020-07-17 LAB — POCT GLYCOSYLATED HEMOGLOBIN (HGB A1C)
Est. average glucose Bld gHb Est-mCnc: 111
Hemoglobin A1C: 5.5 % (ref 4.0–5.6)

## 2020-07-17 NOTE — Patient Instructions (Signed)
.   Please review the attached list of medications and notify my office if there are any errors.   . Please bring all of your medications to every appointment so we can make sure that our medication list is the same as yours.   

## 2020-07-17 NOTE — Progress Notes (Signed)
I,Roshena L Chambers,acting as a scribe for Lelon Huh, MD.,have documented all relevant documentation on the behalf of Lelon Huh, MD,as directed by  Lelon Huh, MD while in the presence of Lelon Huh, MD.  Established patient visit   Patient: Ian Sanders   DOB: Feb 06, 1941   79 y.o. Male  MRN: [id number] Visit Date: 07/17/2020  Today's healthcare provider: Lelon Huh, MD   Chief Complaint  Patient presents with  . Hospitalization Follow-up   Subjective    HPI  Follow up Hospitalization  Patient was admitted to [facility] on 06/25/2020 and discharged on 06/27/2020. He was treated for subdural hematoma. Treatment for this included placing Xarelto on hold, which he was taking for paroxymal atrial fibrillation Per discharge summary, patient is to follow up w/ PCP in 1-2 weeks. Follow up with cardiology in 1 week, however he is not scheduled to see Dr. Saralyn Pilar until October. Follow-up neuro surg, Dr. Lacinda Axon or NP Zdeb, in 3 weeks. He has appt scheduled tomorrow. Patient was advised to keep xarelto on hold until the risks/benefits are discussed with his cardiologist. Telephone follow up was not done. He reports good compliance with treatment. He reports this condition is improved. Patient states he still has a intermittent headache and is a little unsteady when walking. He has also been nervous about having another fall. He ambulates with a cane.   He also states he has occasional pain on the left side of his chest when sitting back in his recliner. It only lasts a couple of seconds and goes away on its on. No dyspnea or palpitations.  -----------------------------------------------------------------------------------------  He is also overdue for HgbA1c. Lab Results  Component Value Date   HGBA1C 6.7 (H) 05/16/2019   HGBA1C 6.3 (A) 10/04/2018        Medications: Outpatient Medications Prior to Visit  Medication Sig  . Biotin 10 MG TABS Take 10 mg by mouth daily.   . calcium-vitamin D (OSCAL) 250-125 MG-UNIT per tablet Take 1 tablet by mouth 2 (two) times daily.   . chlorthalidone (HYGROTON) 25 MG tablet Take 1 tablet (25 mg total) by mouth daily. (Patient taking differently: Take 25 mg by mouth every other day. )  . Cyanocobalamin (VITAMIN B-12 PO) Take by mouth daily.  Marland Kitchen diltiazem (CARDIZEM) 30 MG tablet Take 1 tablet (30 mg total) by mouth once daily as needed (palpitations)  . EPINEPHrine (EPIPEN 2-PAK) 0.3 mg/0.3 mL IJ SOAJ injection Inject 0.3 mg into the muscle once.   . flecainide (TAMBOCOR) 50 MG tablet Take 1 tablet (50 mg total) by mouth 2 (two) times daily.  Marland Kitchen gabapentin (NEURONTIN) 100 MG capsule Take 1 capsule (100 mg total) by mouth 3 (three) times daily. (Patient taking differently: Take 100 mg by mouth 2 (two) times daily. 1 (100 mg) capsule in the Mornings &  2 (200 mg) capsules at Night)  . Leuprolide Acetate, 6 Month, (LUPRON) 45 MG injection Inject 45 mg into the muscle every 6 (six) months.  Marland Kitchen omeprazole (PRILOSEC) 40 MG capsule TAKE ONE CAPSULE DAILY. (Patient taking differently: Take 40 mg by mouth daily. )  . zolpidem (AMBIEN) 10 MG tablet Take 0.5-1 tablets (5-10 mg total) by mouth at bedtime as needed.  Marland Kitchen amLODipine (NORVASC) 5 MG tablet Take 5 mg by mouth daily. (Patient not taking: Reported on 07/17/2020)  . rivaroxaban (XARELTO) 20 MG TABS tablet Take 1 tablet (20 mg total) by mouth daily. Do not take this medication until risks/benefits are discussed w/ the  cardiologist. (Patient not taking: Reported on 07/17/2020)  . tolterodine (DETROL LA) 4 MG 24 hr capsule Take 1 capsule (4 mg total) by mouth daily. (Patient not taking: Reported on 07/17/2020)   Facility-Administered Medications Prior to Visit  Medication Dose Route Frequency Provider  . lidocaine (XYLOCAINE) 2 % jelly 1 application  1 application Urethral Once Stoioff, Ronda Fairly, MD    Review of Systems  Constitutional: Positive for fatigue. Negative for appetite change,  chills and fever.  Respiratory: Negative for chest tightness, shortness of breath and wheezing.   Cardiovascular: Positive for chest pain (left side). Negative for palpitations.  Gastrointestinal: Negative for abdominal pain, nausea and vomiting.  Neurological: Positive for headaches.  Psychiatric/Behavioral: The patient is nervous/anxious.       Objective    BP (!) 166/72 (BP Location: Left Arm, Patient Position: Sitting, Cuff Size: Normal)   Pulse 64   Temp 99.2 F (37.3 C) (Oral)   Resp 16   Wt 171 lb (77.6 kg)   BMI 24.54 kg/m    Physical Exam   General: Appearance:    Well developed, well nourished male in no acute distress  Eyes:    PERRL, conjunctiva/corneas clear, EOM's intact       Lungs:     Clear to auscultation bilaterally, respirations unlabored  Heart:    Normal heart rate. Regular rhythm. No murmurs, rubs, or gallops.   Neurologic:   Awake, alert, oriented x 3. No apparent focal neurological           defect. Normal Romberg, normal finger to nose. Slightly unsteady gait, but is able to walk across room without assistance or use of cane.       EKG: NSR. No ischemic changes.   Results for orders placed or performed in visit on 07/17/20  POCT HgB A1C  Result Value Ref Range   Hemoglobin A1C 5.5 4.0 - 5.6 %   Est. average glucose Bld gHb Est-mCnc 111      Assessment & Plan     1. Traumatic subdural hematoma without loss of consciousness, initial encounter ([place]) Neuro sx steadily improvement. Remains off Xarelto and staying in sinus rhythm. He is scheduled for follow head CT 06-25-2020 and follow up with neurosurgery 06-29-2020  2. Paroxysmal atrial fibrillation (HCC) Remains in SR on current antiarrythmic regiment. Will need to move up cardiology follow up to discuss need for long term anticoagulation - Ambulatory referral to Cardiology  3. Chest pain, unspecified type Brief, intermittent and only occurring at rest. Not likely to be cardiac, but encouraged to  discuss with Dr. Saralyn Pilar at upcoming follow up - EKG 12-Lead - Ambulatory referral to Cardiology  4. Type 2 diabetes mellitus with neurological complications (HCC) Diet controlled. Check a1c 1-2 times annually.   5. Aortic atherosclerosis (HCC) Asymptomatic.   6. 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 Patient prefers not to follow up with Dr. Rogue Bussing. Will continue to monitor CBC perioidically.   7. 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       The entirety of the information documented in the History of Present Illness, Review of Systems and Physical Exam were personally obtained by me. Portions of this information were initially documented by the CMA and reviewed by me for thoroughness and accuracy.      Lelon Huh, MD  [facility] [phone] (phone) [phone] (fax)  [place]

## 2020-07-19 DIAGNOSIS — S065X0D Traumatic subdural hemorrhage without loss of consciousness, subsequent encounter: Secondary | ICD-10-CM | POA: Diagnosis not present

## 2020-07-19 DIAGNOSIS — I10 Essential (primary) hypertension: Secondary | ICD-10-CM | POA: Diagnosis not present

## 2020-07-19 DIAGNOSIS — I480 Paroxysmal atrial fibrillation: Secondary | ICD-10-CM | POA: Diagnosis not present

## 2020-07-19 DIAGNOSIS — E876 Hypokalemia: Secondary | ICD-10-CM | POA: Diagnosis not present

## 2020-07-19 DIAGNOSIS — Z8673 Personal history of transient ischemic attack (TIA), and cerebral infarction without residual deficits: Secondary | ICD-10-CM | POA: Diagnosis not present

## 2020-07-23 ENCOUNTER — Telehealth: Payer: Self-pay | Admitting: Family Medicine

## 2020-07-23 NOTE — Telephone Encounter (Signed)
Ok to order 

## 2020-07-23 NOTE — Telephone Encounter (Signed)
Patient called to ask if he could get an order for a BP cuff.  Please advise and call patient to discuss at [phone]

## 2020-07-23 NOTE — Telephone Encounter (Signed)
Ok to write and order, needs to be for a brachial cuff.

## 2020-07-24 NOTE — Telephone Encounter (Signed)
Order written and placed on Dr. Maralyn Sago desk for signature.

## 2020-07-26 ENCOUNTER — Ambulatory Visit
Admission: RE | Admit: 2020-07-26 | Discharge: 2020-07-26 | Disposition: A | Discharge status: Home / Self Care | Payer: PPO | Source: Ambulatory Visit | Attending: Nurse Practitioner | Admitting: Nurse Practitioner

## 2020-07-26 ENCOUNTER — Other Ambulatory Visit: Payer: Self-pay

## 2020-07-26 DIAGNOSIS — S065X9A Traumatic subdural hemorrhage with loss of consciousness of unspecified duration, initial encounter: Secondary | ICD-10-CM | POA: Diagnosis not present

## 2020-07-26 DIAGNOSIS — I6200 Nontraumatic subdural hemorrhage, unspecified: Secondary | ICD-10-CM | POA: Diagnosis not present

## 2020-07-26 DIAGNOSIS — I7090 Unspecified atherosclerosis: Secondary | ICD-10-CM | POA: Diagnosis not present

## 2020-07-26 DIAGNOSIS — S065XAA Traumatic subdural hemorrhage with loss of consciousness status unknown, initial encounter: Secondary | ICD-10-CM

## 2020-07-30 DIAGNOSIS — S065X9A Traumatic subdural hemorrhage with loss of consciousness of unspecified duration, initial encounter: Secondary | ICD-10-CM | POA: Diagnosis not present

## 2020-08-05 ENCOUNTER — Other Ambulatory Visit: Payer: Self-pay | Admitting: Family Medicine

## 2020-08-05 NOTE — Telephone Encounter (Signed)
Requested medication (s) are due for refill today: requested   Requested medication (s) are on the active medication list: no  Last refill:  3/32/21 #30 0 refills  Future visit scheduled: yes 08/19/20  Notes to clinic:  no order found on profile , not delegated per protocol     Requested Prescriptions  Pending Prescriptions Disp Refills   traMADol (ULTRAM) 50 MG tablet [Pharmacy Med Name: TRAMADOL HCL 50 MG TABLET] 30 tablet 0    Sig: Take 1 tablet (50 mg total) by mouth every 6 (six) hours as needed.      Not Delegated - Analgesics:  Opioid Agonists Failed - 08/05/2020 10:57 AM      Failed - This refill cannot be delegated      Failed - Urine Drug Screen completed in last 360 days.      Passed - Valid encounter within last 6 months    Recent Outpatient Visits           2 weeks ago Traumatic subdural hematoma without loss of consciousness, initial encounter [facility])   [facility] Birdie Sons, MD   1 year ago Essential hypertension   [facility] Birdie Sons, MD   1 year ago Metatarsalgia of both feet   [facility] Birdie Sons, MD   1 year ago Neuropathy   [facility] Birdie Sons, MD   1 year ago Neuropathy   [facility] Birdie Sons, MD       Future Appointments             In 2 weeks Fisher, Kirstie Peri, MD [facility], [place]   In 4 months McGowan, Shannon A, [place]

## 2020-08-12 ENCOUNTER — Telehealth: Payer: Self-pay | Admitting: Urology

## 2020-08-12 NOTE — Telephone Encounter (Signed)
We do have them here in [place]. Will call pt. To let him know.

## 2020-08-12 NOTE — Telephone Encounter (Signed)
We have samples of Toviaz 4 mg here in [place], but I do not know if we have any in the [place] office.  If we do, he may # 28 capsules (4 sleeves) of the Toviaz 4 mg daily.

## 2020-08-12 NOTE — Telephone Encounter (Signed)
Leaj we are in [place] today, is he coming here for records? [place] ok for samples?

## 2020-08-12 NOTE — Telephone Encounter (Signed)
Pt. Wants to know if he can get samples of 4mg .Toviaz. His wife will be coming into office today 08/12/20 to pick up medical records and wants to know if we could have samples ready at the front desk when she comes in. If he can not get samples he would like for someone to call him back.

## 2020-08-19 ENCOUNTER — Encounter: Payer: Self-pay | Admitting: Family Medicine

## 2020-08-19 ENCOUNTER — Other Ambulatory Visit: Payer: Self-pay

## 2020-08-19 ENCOUNTER — Ambulatory Visit (INDEPENDENT_AMBULATORY_CARE_PROVIDER_SITE_OTHER): Payer: PPO | Admitting: Family Medicine

## 2020-08-19 VITALS — BP 165/74 | HR 60 | Temp 98.2°F | Resp 18 | Wt 171.0 lb

## 2020-08-19 DIAGNOSIS — D61818 Other pancytopenia: Secondary | ICD-10-CM

## 2020-08-19 DIAGNOSIS — Z0000 Encounter for general adult medical examination without abnormal findings: Secondary | ICD-10-CM

## 2020-08-19 DIAGNOSIS — Z23 Encounter for immunization: Secondary | ICD-10-CM

## 2020-08-19 DIAGNOSIS — K760 Fatty (change of) liver, not elsewhere classified: Secondary | ICD-10-CM

## 2020-08-19 DIAGNOSIS — R011 Cardiac murmur, unspecified: Secondary | ICD-10-CM | POA: Insufficient documentation

## 2020-08-19 DIAGNOSIS — R42 Dizziness and giddiness: Secondary | ICD-10-CM

## 2020-08-19 DIAGNOSIS — R251 Tremor, unspecified: Secondary | ICD-10-CM | POA: Diagnosis not present

## 2020-08-19 DIAGNOSIS — R140 Abdominal distension (gaseous): Secondary | ICD-10-CM | POA: Diagnosis not present

## 2020-08-19 NOTE — Progress Notes (Signed)
Complete physical exam   Patient: Ian Sanders   DOB: Oct 11, 1941   79 y.o. Male  MRN: [id number] Visit Date: 08/19/2020  Today's healthcare provider: Lelon Huh, MD   Chief Complaint  Patient presents with   Annual Exam   Subjective    Ian Sanders is a 79 y.o. male who presents today for a complete physical exam.  He reports consuming a general diet. The patient does not participate in regular exercise at present. He generally feels fairly well. He reports sleeping fairly well. He does have additional problems to discuss today.   Had AWV with [facility] on 05/14/2020  HPI   His primary complaint today is swelling in the abdomen, and occasionally feeling somewhat dizzy. He was found to have indications of hepatocellular disease and splenomegaly on ultrasound last year and was referred to Dr. Vicente Males. He was started on hepatitis A and B series and recommended Q6 month ultrasound and periodic liver functions. He was pancytopenic with elevated INR. He denies any abnormal bruising or bleeding.   Past Medical History:  Diagnosis Date   Atrial fibrillation (HCC)    Basal cell carcinoma    Benign paroxysmal positional vertigo    Cancer (HCC)    History of chicken pox    History of measles    History of mumps    Hypertension    Mononeuritis of unspecified site    Other seborrheic keratosis    Stroke ([place])    Transient ischemic attack (TIA), and cerebral infarction without residual deficits(V12.54)    Unspecified hypothyroidism    Past Surgical History:  Procedure Laterality Date   Carotid Doppler Ultrasound  04/02/2010   Small amount Calcified plaque bilaterally, no significant stenosis.   COLONOSCOPY  05/11/2014   Tubular Adenoma. Dr. Allen Norris   COLONOSCOPY WITH PROPOFOL N/A 05/23/2018   Procedure: COLONOSCOPY WITH PROPOFOL;  Surgeon: [name], MD;  Location: [facility] ENDOSCOPY;  Service: Gastroenterology;  Laterality: N/A;   DOPPLER ECHOCARDIOGRAPHY  04/02/2010    Mild left atrial dilation. Normal right atrium. No valvular disease. No thrombus. Normal LV function. EF>55%   ESOPHAGOGASTRODUODENOSCOPY (EGD) WITH PROPOFOL N/A 08/16/2019   Procedure: ESOPHAGOGASTRODUODENOSCOPY (EGD) WITH PROPOFOL;  Surgeon: [name], MD;  Location: [facility] ENDOSCOPY;  Service: Gastroenterology;  Laterality: N/A;   FINGER AMPUTATION     HERNIA REPAIR     MRI Brain with and without contrast  03/20/2010   Suggestive of basal ganglia lacunar infarct   PROSTATECTOMY  1995   Abdominal   SKIN SURGERY     multiple   Thumb surgery     Social History   Socioeconomic History   Marital status: Married    Spouse name: Not on file   Number of children: 2   Years of education: Not on file   Highest education level: Associate degree: occupational, Hotel manager, or vocational program  Occupational History   Occupation: Retired  Tobacco Use   Smoking status: Former Smoker    Packs/day: 0.00    Years: 12.00    Pack years: 0.00    Types: Cigarettes   Smokeless tobacco: Current User    Types: Chew   Tobacco comment: quit over 45 years ago  Vaping Use   Vaping Use: Never used  Substance and Sexual Activity   Alcohol use: Yes    Alcohol/week: 5.0 standard drinks    Types: 5 Cans of beer per week   Drug use: No   Sexual activity: Not on file  Other Topics  Concern   Not on file  Social History Narrative   Lives with wife at home ; alcohol abuse; quit smoking 2016; 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   Social Determinants of Health   Financial Resource Strain: Low Risk    Difficulty of Paying Living Expenses: Not hard at all  Food Insecurity: No Food Insecurity   Worried About Charity fundraiser in the Last Year: Never true   [place] in the Last Year: Never true  Transportation Needs: No Transportation Needs   Lack of Transportation (Medical): No   Lack of Transportation (Non-Medical): No  Physical Activity: Inactive    Days of Exercise per Week: 0 days   Minutes of Exercise per Session: 0 min  Stress: No Stress Concern Present   Feeling of Stress : Not at all  Social Connections: Moderately Isolated   Frequency of Communication with Friends and Family: More than three times a week   Frequency of Social Gatherings with Friends and Family: More than three times a week   Attends Religious Services: Never   Marine scientist or Organizations: No   Attends Music therapist: Never   Marital Status: Married  Human resources officer Violence: Not At Risk   Fear of Current or Ex-Partner: No   Emotionally Abused: No   Physically Abused: No   Sexually Abused: No   Family Status  Relation Name Status   Mother  Deceased at age 81   Brother  Alive   Sister  Alive   Brother  Deceased       Cause of Death: Self inflicted gun shot wound   Brother  Alive       prostate issues   Sister  Deceased   Father  Deceased       leukemia   Sister  Alive   Sister  Deceased at age 34       cerebral aneurysm   Brother  Web designer  (Not Specified)   Daughter  Alive   Son  Alive   Family History  Problem Relation Age of Onset   Heart disease Mother 74       CABG   Hypertension Mother    Heart attack Mother    Hypertension Brother    Heart disease Sister        stents   Prostate cancer Brother    Kidney cancer Brother    Skin cancer Brother    Hypertension Brother    Bladder Cancer Sister    Leukemia Father    Stroke Father        multiple   Cerebral aneurysm Sister    Kidney cancer Brother    Allergies  Allergen Reactions   Lisinopril Swelling    Tongue swelling Tongue swelling  Tongue swelling Tongue swelling  Tongue swelling    Myrbetriq [Mirabegron] Swelling    Of the tongue   Atorvastatin Other (See Comments)    Dizziness and Fatigue  Dizziness and Fatigue   Dabigatran Etexilate Mesylate Other (See Comments)    Stomach ulcers  (Pradaxa)   Dabigatran Etexilate Mesylate Other (See Comments)    Stomach ulcers (Pradaxa)    Patient Care Team: Birdie Sons, MD as PCP - General (Family Medicine) Nori Riis, PA-C as Physician Assistant (Urology) Isaias Cowman, MD as Consulting Physician (Cardiology) Jannet Mantis, MD (Dermatology) Pa, [facility]   Medications: Outpatient Medications Prior to Visit  Medication Sig  Biotin 10 MG TABS Take 8 mg by mouth daily.    calcium-vitamin D (OSCAL) 250-125 MG-UNIT per tablet Take 1 tablet by mouth 2 (two) times daily.    chlorthalidone (HYGROTON) 25 MG tablet Take 1 tablet (25 mg total) by mouth daily. (Patient taking differently: Take 25 mg by mouth every other day. )   Cyanocobalamin (VITAMIN B-12 PO) Take by mouth daily.   diltiazem (CARDIZEM) 30 MG tablet Take 1 tablet (30 mg total) by mouth once daily as needed (palpitations)   EPINEPHrine (EPIPEN 2-PAK) 0.3 mg/0.3 mL IJ SOAJ injection Inject 0.3 mg into the muscle once.    flecainide (TAMBOCOR) 50 MG tablet Take 1 tablet (50 mg total) by mouth 2 (two) times daily.   gabapentin (NEURONTIN) 100 MG capsule Take 1 capsule (100 mg total) by mouth 3 (three) times daily. (Patient taking differently: Take 100 mg by mouth 2 (two) times daily. 1 (100 mg) capsule in the Mornings &  2 (200 mg) capsules at Night)   Leuprolide Acetate, 6 Month, (LUPRON) 45 MG injection Inject 45 mg into the muscle every 6 (six) months.   omeprazole (PRILOSEC) 40 MG capsule TAKE ONE CAPSULE DAILY. (Patient taking differently: Take 40 mg by mouth daily. )   rivaroxaban (XARELTO) 20 MG TABS tablet Take 1 tablet (20 mg total) by mouth daily. Do not take this medication until risks/benefits are discussed w/ the cardiologist.   traMADol (ULTRAM) 50 MG tablet Take 1 tablet (50 mg total) by mouth every 6 (six) hours as needed.   zolpidem (AMBIEN) 10 MG tablet Take 0.5-1 tablets (5-10 mg total) by mouth at  bedtime as needed.   Facility-Administered Medications Prior to Visit  Medication Dose Route Frequency Provider   lidocaine (XYLOCAINE) 2 % jelly 1 application  1 application Urethral Once Stoioff, Scott C, MD    Review of Systems  Constitutional: Negative for appetite change, chills, fatigue and fever.  HENT: Negative for congestion, ear pain, hearing loss, nosebleeds and trouble swallowing.   Eyes: Negative for pain and visual disturbance.  Respiratory: Negative for cough, chest tightness and shortness of breath.   Cardiovascular: Negative for chest pain, palpitations and leg swelling.  Gastrointestinal: Positive for abdominal distention. Negative for abdominal pain, blood in stool, constipation, diarrhea, nausea and vomiting.  Endocrine: Negative for polydipsia, polyphagia and polyuria.  Genitourinary: Negative for dysuria and flank pain.  Musculoskeletal: Negative for arthralgias, back pain, joint swelling, myalgias and neck stiffness.  Skin: Negative for color change, rash and wound.  Neurological: Positive for tremors (in hands). Negative for dizziness, seizures, speech difficulty, weakness, light-headedness and headaches.  Psychiatric/Behavioral: Negative for behavioral problems, confusion, decreased concentration, dysphoric mood and sleep disturbance. The patient is not nervous/anxious.   All other systems reviewed and are negative.      Objective    BP (!) 165/74 (BP Location: Left Arm, Patient Position: Sitting, Cuff Size: Normal)    Pulse 60    Temp 98.2 F (36.8 C) (Oral)    Resp 18    Wt 171 lb (77.6 kg)    BMI 24.54 kg/m    Physical Exam   General Appearance:    Well developed, well nourished male. Alert, cooperative, in no acute distress, appears stated age  Head:    Normocephalic, without obvious abnormality, atraumatic  Eyes:    PERRL, conjunctiva/corneas clear, EOM's intact, fundi    benign, both eyes       Ears:    Normal TM's and external ear canals, both  ears  Nose:   Nares normal, septum midline, mucosa normal, no drainage   or sinus tenderness  Throat:   Lips, mucosa, and tongue normal; teeth and gums normal  Neck:   Supple, symmetrical, trachea midline, no adenopathy;       thyroid:  No enlargement/tenderness/nodules; no carotid   bruit or JVD  Back:     Symmetric, no curvature, ROM normal, no CVA tenderness  Lungs:     Clear to auscultation bilaterally, respirations unlabored  Chest wall:    No tenderness or deformity  Heart:    Normal heart rate. Irregularly irregular rhythm.  2/6 systolic murmur at right upper sternal border S1 and S2 normal  Abdomen:     Soft, non-tender, bowel sounds active all four quadrants,    Moderately distended. Palpable spenomegaly. Unable to palpate liver.   Genitalia:    deferred  Rectal:    deferred  Extremities:    No cyanosis or edema  Pulses:   2+ and symmetric all extremities  Skin:   Skin color, texture, turgor normal, no rashes or lesions  Lymph nodes:   Cervical, supraclavicular, and axillary nodes normal  Neurologic:   CNII-XII intact. Normal strength, sensation and reflexes      throughout     Last depression screening scores PHQ 2/9 Scores 05/14/2020 05/09/2019 05/09/2019  PHQ - 2 Score 0 0 0  PHQ- 9 Score - 5 -   Last fall risk screening Fall Risk  05/14/2020  Falls in the past year? 0  Number falls in past yr: 0  Injury with Fall? 0  Follow up -   Last Audit-C alcohol use screening Alcohol Use Disorder Test (AUDIT) 05/14/2020  1. How often do you have a drink containing alcohol? 3  2. How many drinks containing alcohol do you have on a typical day when you are drinking? 0  3. How often do you have six or more drinks on one occasion? 0  AUDIT-C Score 3  4. How often during the last year have you found that you were not able to stop drinking once you had started? 0  5. How often during the last year have you failed to do what was normally expected from you because of drinking? 0  6.  How often during the last year have you needed a first drink in the morning to get yourself going after a heavy drinking session? 0  7. How often during the last year have you had a feeling of guilt of remorse after drinking? 0  8. How often during the last year have you been unable to remember what happened the night before because you had been drinking? 0  9. Have you or someone else been injured as a result of your drinking? 0  10. Has a relative or friend or a doctor or another health worker been concerned about your drinking or suggested you cut down? 0  Alcohol Use Disorder Identification Test Final Score (AUDIT) 3  Alcohol Brief Interventions/Follow-up AUDIT Score <7 follow-up not indicated   A score of 3 or more in women, and 4 or more in men indicates increased risk for alcohol abuse, EXCEPT if all of the points are from question 1   No results found for any visits on 08/19/20.  Assessment & Plan    Routine Health Maintenance and Physical Exam  Exercise Activities and Dietary recommendations Goals     DIET - INCREASE WATER INTAKE     Recommend increasing water intake  to 4-6 glasses a day.      Exercise 3x per week (30 min per time)     Recommend increasing exercise. Pt to work up to walking 3 days a week for 30 minutes.        Immunization History  Administered Date(s) Administered   Hep A / Hep B 08/24/2019   Influenza, High Dose Seasonal PF 08/09/2015, 07/23/2016, 08/26/2018   PFIZER SARS-COV-2 Vaccination 12/01/2019, 12/22/2019   Pneumococcal Conjugate-13 12/15/2013   Pneumococcal Polysaccharide-23 11/16/2006   Td 05/29/2016    Health Maintenance  Topic Date Due   FOOT EXAM  04/27/2018   URINE MICROALBUMIN  04/27/2018   INFLUENZA VACCINE  06/16/2020   OPHTHALMOLOGY EXAM  06/25/2020   HEMOGLOBIN A1C  01/14/2021   COLONOSCOPY  05/24/2023   TETANUS/TDAP  05/29/2026   COVID-19 Vaccine  Completed   Hepatitis C Screening  Completed   PNA vac Low  Risk Adult  Completed    Discussed health benefits of physical activity, and encouraged him to engage in regular exercise appropriate for his age and condition.  1. Annual physical exam   2. Abdominal distension Likely cirrhosis. He states he did stop drinking alcohol 3 months ago. Is not having abdominal pain.  - US Abdomen Complete; Future  3. Fatty liver  - Comprehensive metabolic panel - Am[place]ia - INR/PT - AFP tumor marker - Hepatitis A vaccine adult IM #2 - Heplisav-B (HepB-CPG) Vaccine #2  4. Tremors of nervous system Mild and intermittent. Suspect early asterixis.   5. Dizziness Multifactorial.   6. Pancytopenia (HCC) Likely secondary to splenomegaly - CBC  7. Heart murmur, systolic Has cardiology follow up tomorrow with Dr. Saralyn Pilar.   8. Need for influenza vaccination  - Flu Vaccine QUAD High Dose IM (Fluad)       The entirety of the information documented in the History of Present Illness, Review of Systems and Physical Exam were personally obtained by me. Portions of this information were initially documented by the CMA and reviewed by me for thoroughness and accuracy.      Lelon Huh, MD  [facility] [phone] (phone) [phone] (fax)  [place]

## 2020-08-20 DIAGNOSIS — I10 Essential (primary) hypertension: Secondary | ICD-10-CM | POA: Diagnosis not present

## 2020-08-20 DIAGNOSIS — S065X0D Traumatic subdural hemorrhage without loss of consciousness, subsequent encounter: Secondary | ICD-10-CM | POA: Diagnosis not present

## 2020-08-20 DIAGNOSIS — Z8673 Personal history of transient ischemic attack (TIA), and cerebral infarction without residual deficits: Secondary | ICD-10-CM | POA: Diagnosis not present

## 2020-08-20 DIAGNOSIS — I480 Paroxysmal atrial fibrillation: Secondary | ICD-10-CM | POA: Diagnosis not present

## 2020-08-20 LAB — CBC
Hematocrit: 36.6 % — ABNORMAL LOW (ref 37.5–51.0)
Hemoglobin: 12.2 g/dL — ABNORMAL LOW (ref 13.0–17.7)
MCH: 26 pg — ABNORMAL LOW (ref 26.6–33.0)
MCHC: 33.3 g/dL (ref 31.5–35.7)
MCV: 78 fL — ABNORMAL LOW (ref 79–97)
Platelets: 47 10*3/uL — CL (ref 150–450)
RBC: 4.69 x10E6/uL (ref 4.14–5.80)
RDW: 13 % (ref 11.6–15.4)
WBC: 2.5 10*3/uL — CL (ref 3.4–10.8)

## 2020-08-20 LAB — COMPREHENSIVE METABOLIC PANEL
ALT: 13 IU/L (ref 0–44)
AST: 23 IU/L (ref 0–40)
Albumin/Globulin Ratio: 2 (ref 1.2–2.2)
Albumin: 4.5 g/dL (ref 3.7–4.7)
Alkaline Phosphatase: 87 IU/L (ref 44–121)
BUN/Creatinine Ratio: 11 (ref 10–24)
BUN: 9 mg/dL (ref 8–27)
Bilirubin Total: 0.9 mg/dL (ref 0.0–1.2)
CO2: 28 mmol/L (ref 20–29)
Calcium: 9.3 mg/dL (ref 8.6–10.2)
Chloride: 99 mmol/L (ref 96–106)
Creatinine, Ser: 0.81 mg/dL (ref 0.76–1.27)
GFR calc Af Amer: 98 mL/min/{1.73_m2} (ref >59)
GFR calc non Af Amer: 85 mL/min/{1.73_m2} (ref >59)
Globulin, Total: 2.3 g/dL (ref 1.5–4.5)
Glucose: 95 mg/dL (ref 65–99)
Potassium: 3.4 mmol/L — ABNORMAL LOW (ref 3.5–5.2)
Sodium: 140 mmol/L (ref 134–144)
Total Protein: 6.8 g/dL (ref 6.0–8.5)

## 2020-08-20 LAB — AMMONIA: Ammonia: 46 ug/dL (ref 31–169)

## 2020-08-20 LAB — AFP TUMOR MARKER: AFP, Serum, Tumor Marker: 2.4 ng/mL (ref 0.0–8.3)

## 2020-08-20 LAB — PROTIME-INR
INR: 1.2 (ref 0.9–1.2)
Prothrombin Time: 12.3 s — ABNORMAL HIGH (ref 9.1–12.0)

## 2020-08-26 ENCOUNTER — Other Ambulatory Visit: Payer: Self-pay | Admitting: Family Medicine

## 2020-08-26 ENCOUNTER — Other Ambulatory Visit: Payer: Self-pay

## 2020-08-26 ENCOUNTER — Ambulatory Visit
Admission: RE | Admit: 2020-08-26 | Discharge: 2020-08-26 | Disposition: A | Discharge status: Home / Self Care | Payer: PPO | Source: Ambulatory Visit | Attending: Family Medicine | Admitting: Family Medicine

## 2020-08-26 DIAGNOSIS — R140 Abdominal distension (gaseous): Secondary | ICD-10-CM | POA: Diagnosis not present

## 2020-08-26 DIAGNOSIS — R161 Splenomegaly, not elsewhere classified: Secondary | ICD-10-CM

## 2020-08-26 DIAGNOSIS — K8020 Calculus of gallbladder without cholecystitis without obstruction: Secondary | ICD-10-CM | POA: Diagnosis not present

## 2020-08-26 DIAGNOSIS — D61818 Other pancytopenia: Secondary | ICD-10-CM

## 2020-08-27 ENCOUNTER — Telehealth: Payer: Self-pay

## 2020-08-27 ENCOUNTER — Ambulatory Visit: Payer: Self-pay | Admitting: *Deleted

## 2020-08-27 NOTE — Telephone Encounter (Signed)
Pt given ultrasound results per notes of Dr. Caryn Section on 08/27/20. Pt verbalized understanding.

## 2020-08-27 NOTE — Telephone Encounter (Signed)
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 This is the cause of his abdomen swelling, and causing him to have low blood cell counts as well. He needs referral to hematology for follow up. Have placed referral.

## 2020-08-27 NOTE — Telephone Encounter (Signed)
Attempted to contact patient, no answer left a voicemail. Okay for [facility] to advise patient.  

## 2020-08-30 NOTE — Telephone Encounter (Signed)
Left detailed message on PATIENTS private cell vm. I explained Hematology will be calling for very imortant appt since spleen has enlarged and low blood count and swelling are present.

## 2020-09-05 ENCOUNTER — Telehealth: Payer: Self-pay

## 2020-09-05 DIAGNOSIS — D696 Thrombocytopenia, unspecified: Secondary | ICD-10-CM

## 2020-09-05 NOTE — Telephone Encounter (Signed)
2nd time patient left voicemail about setting up a appt he is already est patient guess need f/up appt???? Thanks   -Dr. B please advise from [facility] Note from yesterday.

## 2020-09-05 NOTE — Telephone Encounter (Signed)
Pt is established with [place] already. Please have the pt follow up with me- next week; MD- labs- cbc/cmp/AFP; PSA.  Thanks, GB

## 2020-09-06 NOTE — Addendum Note (Signed)
Addended by: Delice Bison E on: 09/06/2020 01:07 PM   Modules accepted: Orders

## 2020-09-06 NOTE — Telephone Encounter (Signed)
Labs ordered.

## 2020-09-12 ENCOUNTER — Telehealth: Payer: Self-pay

## 2020-09-12 ENCOUNTER — Inpatient Hospital Stay: Payer: PPO | Attending: Internal Medicine

## 2020-09-12 ENCOUNTER — Encounter: Payer: Self-pay | Admitting: Internal Medicine

## 2020-09-12 ENCOUNTER — Other Ambulatory Visit: Payer: Self-pay

## 2020-09-12 ENCOUNTER — Inpatient Hospital Stay (HOSPITAL_BASED_OUTPATIENT_CLINIC_OR_DEPARTMENT_OTHER): Payer: PPO | Admitting: Internal Medicine

## 2020-09-12 DIAGNOSIS — Z808 Family history of malignant neoplasm of other organs or systems: Secondary | ICD-10-CM | POA: Diagnosis not present

## 2020-09-12 DIAGNOSIS — Z9079 Acquired absence of other genital organ(s): Secondary | ICD-10-CM | POA: Diagnosis not present

## 2020-09-12 DIAGNOSIS — Z85828 Personal history of other malignant neoplasm of skin: Secondary | ICD-10-CM | POA: Insufficient documentation

## 2020-09-12 DIAGNOSIS — K8020 Calculus of gallbladder without cholecystitis without obstruction: Secondary | ICD-10-CM | POA: Diagnosis not present

## 2020-09-12 DIAGNOSIS — Z8673 Personal history of transient ischemic attack (TIA), and cerebral infarction without residual deficits: Secondary | ICD-10-CM | POA: Diagnosis not present

## 2020-09-12 DIAGNOSIS — Z7289 Other problems related to lifestyle: Secondary | ICD-10-CM | POA: Diagnosis not present

## 2020-09-12 DIAGNOSIS — Z8249 Family history of ischemic heart disease and other diseases of the circulatory system: Secondary | ICD-10-CM | POA: Diagnosis not present

## 2020-09-12 DIAGNOSIS — M549 Dorsalgia, unspecified: Secondary | ICD-10-CM | POA: Diagnosis not present

## 2020-09-12 DIAGNOSIS — D731 Hypersplenism: Secondary | ICD-10-CM | POA: Insufficient documentation

## 2020-09-12 DIAGNOSIS — Z806 Family history of leukemia: Secondary | ICD-10-CM | POA: Insufficient documentation

## 2020-09-12 DIAGNOSIS — C61 Malignant neoplasm of prostate: Secondary | ICD-10-CM | POA: Insufficient documentation

## 2020-09-12 DIAGNOSIS — Z87891 Personal history of nicotine dependence: Secondary | ICD-10-CM | POA: Diagnosis not present

## 2020-09-12 DIAGNOSIS — I4891 Unspecified atrial fibrillation: Secondary | ICD-10-CM | POA: Diagnosis not present

## 2020-09-12 DIAGNOSIS — Z8051 Family history of malignant neoplasm of kidney: Secondary | ICD-10-CM | POA: Diagnosis not present

## 2020-09-12 DIAGNOSIS — Z8052 Family history of malignant neoplasm of bladder: Secondary | ICD-10-CM | POA: Insufficient documentation

## 2020-09-12 DIAGNOSIS — Z888 Allergy status to other drugs, medicaments and biological substances status: Secondary | ICD-10-CM | POA: Diagnosis not present

## 2020-09-12 DIAGNOSIS — M2550 Pain in unspecified joint: Secondary | ICD-10-CM | POA: Insufficient documentation

## 2020-09-12 DIAGNOSIS — D696 Thrombocytopenia, unspecified: Secondary | ICD-10-CM

## 2020-09-12 DIAGNOSIS — K7460 Unspecified cirrhosis of liver: Secondary | ICD-10-CM | POA: Diagnosis not present

## 2020-09-12 DIAGNOSIS — D72819 Decreased white blood cell count, unspecified: Secondary | ICD-10-CM | POA: Insufficient documentation

## 2020-09-12 DIAGNOSIS — Z79899 Other long term (current) drug therapy: Secondary | ICD-10-CM | POA: Insufficient documentation

## 2020-09-12 DIAGNOSIS — R5383 Other fatigue: Secondary | ICD-10-CM | POA: Insufficient documentation

## 2020-09-12 LAB — CBC WITH DIFFERENTIAL/PLATELET
Abs Immature Granulocytes: 0.02 10*3/uL (ref 0.00–0.07)
Basophils Absolute: 0 10*3/uL (ref 0.0–0.1)
Basophils Relative: 1 %
Eosinophils Absolute: 0.1 10*3/uL (ref 0.0–0.5)
Eosinophils Relative: 5 %
HCT: 35.2 % — ABNORMAL LOW (ref 39.0–52.0)
Hemoglobin: 11.5 g/dL — ABNORMAL LOW (ref 13.0–17.0)
Immature Granulocytes: 1 %
Lymphocytes Relative: 19 %
Lymphs Abs: 0.3 10*3/uL — ABNORMAL LOW (ref 0.7–4.0)
MCH: 25.8 pg — ABNORMAL LOW (ref 26.0–34.0)
MCHC: 32.7 g/dL (ref 30.0–36.0)
MCV: 78.9 fL — ABNORMAL LOW (ref 80.0–100.0)
Monocytes Absolute: 0.1 10*3/uL (ref 0.1–1.0)
Monocytes Relative: 6 %
Neutro Abs: 1.2 10*3/uL — ABNORMAL LOW (ref 1.7–7.7)
Neutrophils Relative %: 68 %
Platelets: 51 10*3/uL — ABNORMAL LOW (ref 150–400)
RBC: 4.46 MIL/uL (ref 4.22–5.81)
RDW: 13.1 % (ref 11.5–15.5)
WBC: 1.7 10*3/uL — ABNORMAL LOW (ref 4.0–10.5)
nRBC: 0 % (ref 0.0–0.2)

## 2020-09-12 LAB — COMPREHENSIVE METABOLIC PANEL
ALT: 16 U/L (ref 0–44)
AST: 26 U/L (ref 15–41)
Albumin: 4.3 g/dL (ref 3.5–5.0)
Alkaline Phosphatase: 71 U/L (ref 38–126)
Anion gap: 8 (ref 5–15)
BUN: 11 mg/dL (ref 8–23)
CO2: 30 mmol/L (ref 22–32)
Calcium: 8.9 mg/dL (ref 8.9–10.3)
Chloride: 98 mmol/L (ref 98–111)
Creatinine, Ser: 0.79 mg/dL (ref 0.61–1.24)
GFR, Estimated: >60 mL/min (ref >60)
Glucose, Bld: 161 mg/dL — ABNORMAL HIGH (ref 70–99)
Potassium: 3.1 mmol/L — ABNORMAL LOW (ref 3.5–5.1)
Sodium: 136 mmol/L (ref 135–145)
Total Bilirubin: 1.4 mg/dL — ABNORMAL HIGH (ref 0.3–1.2)
Total Protein: 7.2 g/dL (ref 6.5–8.1)

## 2020-09-12 NOTE — Progress Notes (Signed)
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 Vicente Males   Prostate cancer [facility]) (Resolved)  06/20/2015 Initial Diagnosis   Prostate cancer ([place])   Prostate cancer ([place])  09/12/2020 Initial Diagnosis   Prostate cancer ([place])      HISTORY OF PRESENTING ILLNESS:  Ian Sanders 79 y.o.  male pleasant patient with history of alcohol use /cirrhosis /A. Fib-is here for follow-up of his worsening leukopenia/thrombocytopenia.  Patient was recently admitted to hospital after a fall.  Noted to have intracranial bleed/subdural hematoma which improved conservatively after holding his Xarelto.  As per cardiology he is currently on baby aspirin; Xarelto has been discontinued.  Patient is referred to [place] for further evaluation given his worsening thrombocytopenia/leukopenia.  Patient denies any fevers or chills.   Review of Systems  Constitutional: Positive for malaise/fatigue. Negative for chills, diaphoresis, fever and weight loss.  HENT: Negative for nosebleeds and sore throat.   Eyes: Negative for double vision.  Respiratory: Negative for cough, hemoptysis, sputum production, shortness of breath and wheezing.    Cardiovascular: Negative for chest pain, palpitations, orthopnea and leg swelling.  Gastrointestinal: Negative for abdominal pain, blood in stool, constipation, diarrhea, heartburn, melena, nausea and vomiting.  Genitourinary: Negative for dysuria, frequency and urgency.  Musculoskeletal: Positive for back pain and joint pain.  Skin: Negative.  Negative for itching and rash.  Neurological: Negative for dizziness, tingling, focal weakness, weakness and headaches.  Endo/Heme/Allergies: Does not bruise/bleed easily.  Psychiatric/Behavioral: Negative for depression. The patient is not nervous/anxious and does not have insomnia.      MEDICAL HISTORY:  Past Medical History:  Diagnosis Date  . Atrial fibrillation ([place])   . Basal cell carcinoma   . Benign paroxysmal positional vertigo   . Cancer ([place])   . History of chicken pox   . History of measles   . History of mumps   . Hypertension   . Mononeuritis of unspecified site   . Other seborrheic keratosis   . Stroke ([place])   . 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 Traumatic subdural hematoma without loss of consciousness ([place]) 06/25/2020  . Unspecified hypothyroidism     SURGICAL HISTORY: Past Surgical History:  Procedure Laterality Date  . Carotid Doppler Ultrasound  04/02/2010   Small amount Calcified plaque bilaterally, no significant stenosis.  . COLONOSCOPY  05/11/2014   Tubular Adenoma. Dr. Allen Norris  . COLONOSCOPY WITH PROPOFOL N/A 05/23/2018   Procedure: COLONOSCOPY WITH PROPOFOL;  Surgeon: [name], MD;  Location[facility] END[facility]COPY;  Service: Gastroenterology;  Laterality: N/A;  . DOPPLER ECHOCARDIOGRAPHY  04/02/2010   Mild left atrial dilation. Normal right atrium. No valvular disease. No thrombus. Normal LV function. EF>55%  . ESOPHAGOGASTRODUODENOSCOPY (EGD) WITH PROPOFOL N/A 08/16/2019   Procedure: ESOPHAGOGASTRODUODENOSCOPY (EGD) WITH PROPOFOL;  Surgeon: [name], MD;   Location: [facility] ENDOSCOPY;  Service:  Gastroenterology;  Laterality: N/A;  . FINGER AMPUTATION    . HERNIA REPAIR    . MRI Brain with and without contrast  03/20/2010   Suggestive of basal ganglia lacunar infarct  . PROSTATECTOMY  1995   Abdominal  . SKIN SURGERY     multiple  . Thumb surgery      SOCIAL HISTORY: Social History   Socioeconomic History  . Marital status: Married    Spouse name: Not on file  . Number of children: 2  . Years of education: Not on file  . Highest education level: Associate degree: occupational, Hotel manager, or vocational program  Occupational History  . Occupation: Retired  Tobacco Use  . Smoking status: Former Smoker    Packs/day: 0.00    Years: 12.00    Pack years: 0.00    Types: Cigarettes  . Smokeless tobacco: Current User    Types: Chew  . Tobacco comment: quit over 45 years ago  Vaping Use  . Vaping Use: Never used  Substance and Sexual Activity  . Alcohol use: Not Currently    Alcohol/week: 5.0 standard drinks    Types: 5 Cans of beer per week    Comment: quit drinking summer 2021  . Drug use: No  . Sexual activity: Not on file  Other Topics Concern  . 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 Difficulty of Paying Living Expenses: Not hard at all  Food Insecurity: No Food Insecurity  . Worried About Charity fundraiser in the Last Year: Never true  . Ran Out of Food in the Last Year: Never true  Transportation Needs: No Transportation Needs  . Lack of Transportation (Medical): No  . Lack of Transportation (Non-Medical): No  Physical Activity: Inactive  . Days of Exercise per Week: 0 days  . Minutes of Exercise per Session: 0 min  Stress: No Stress Concern Present  . Feeling of Stress : Not at all  Social Connections: Moderately Isolated  .  Frequency of Communication with Friends and Family: More than three times a week  . Frequency of Social Gatherings with Friends and Family: More than three times a week  . Attends Religious Services: Never  . Active Member of Clubs or Organizations: No  . Attends Archivist Meetings: Never  . Marital Status: Married  Human resources officer Violence: Not At Risk  . Fear of Current or Ex-Partner: No  . Emotionally Abused: No  . Physically Abused: No  . Sexually Abused: No    FAMILY HISTORY: Family History  Problem Relation Age of Onset  . Heart disease Mother 64       CABG  . Hypertension Mother   . Heart attack Mother   . Hypertension Brother   . Heart disease Sister        stents  . Prostate cancer Brother   . Kidney cancer Brother   . Skin cancer Brother   . Hypertension Brother   . Bladder Cancer Sister   . Leukemia Father   . Stroke Father        multiple  . Cerebral aneurysm Sister   . Kidney cancer Brother     ALLERGIES:  is allergic to lisinopril, myrbetriq [mirabegron], atorvastatin, dabigatran etexilate mesylate, and dabigatran etexilate mesylate.  MEDICATIONS:  Current Outpatient Medications  Medication Sig Dispense Refill  . aspirin 81 MG chewable tablet Chew by mouth daily.    . Biotin 10 MG TABS Take 8 mg by mouth daily.     . calcium-vitamin D (OSCAL) 250-125 MG-UNIT per tablet Take 1 tablet by mouth 2 (two) times daily.     . chlorthalidone (HYGROTON) 25 MG tablet Take 1 tablet (25 mg total) by mouth daily. (Patient taking differently: Take 25 mg by mouth every other day. ) 90 tablet 3  . Cyanocobalamin (VITAMIN B-12 PO) Take by mouth daily.    . flecainide (TAMBOCOR) 50 MG tablet Take 1 tablet (50 mg total) by mouth 2 (two) times daily. 180 tablet 3  . gabapentin (NEURONTIN) 100 MG capsule Take 1 capsule (100 mg total) by mouth 3 (three) times daily. (Patient taking differently: Take 100 mg by mouth 2 (two) times daily. 1 (100 mg) capsule in the  Mornings &  2 (200 mg) capsules at Night) 270 capsule 4  . Leuprolide Acetate, 6 Month, (LUPRON) 45 MG injection Inject 45 mg into the muscle every 6 (six) months.    Marland Kitchen losartan (COZAAR) 25 MG tablet Take by mouth.    Marland Kitchen omeprazole (PRILOSEC) 40 MG capsule TAKE ONE CAPSULE DAILY. (Patient taking differently: Take 40 mg by mouth daily. ) 90 capsule 4  . Potassium 99 MG TABS Take by mouth.    . traMADol (ULTRAM) 50 MG tablet Take 1 tablet (50 mg total) by mouth every 6 (six) hours as needed. 30 tablet 4  . zolpidem (AMBIEN) 10 MG tablet Take 0.5-1 tablets (5-10 mg total) by mouth at bedtime as needed. 30 tablet 5  . diltiazem (CARDIZEM) 30 MG tablet Take 1 tablet (30 mg total) by mouth once daily as needed (palpitations) (Patient not taking: Reported on 09/12/2020)    . EPINEPHrine (EPIPEN 2-PAK) 0.3 mg/0.3 mL IJ SOAJ injection Inject 0.3 mg into the muscle once.  (Patient not taking: Reported on 09/12/2020)     Current Facility-Administered Medications  Medication Dose Route Frequency Provider Last Rate Last Admin  . lidocaine (XYLOCAINE) 2 % jelly 1 application  1 application Urethral Once Stoioff, Scott C, MD         .  PHYSICAL EXAMINATION:   Vitals:   09/12/20 1039  BP: (!) 143/63  Pulse: 68  Resp: 18  Temp: (!) 97.2 F (36.2 C)  SpO2: 100%   Filed Weights   09/12/20 1039  Weight: 172 lb 3.2 oz (78.1 kg)    Physical Exam HENT:     Head: Normocephalic and atraumatic.     Mouth/Throat:     Pharynx: No oropharyngeal exudate.  Eyes:     Pupils: Pupils are equal, round, and reactive to light.  Cardiovascular:     Rate and Rhythm: Normal rate and regular rhythm.  Pulmonary:     Effort: No respiratory distress.     Breath sounds: No wheezing.  Abdominal:     General: Bowel sounds are normal. There is no distension.     Palpations: Abdomen is soft. There is no mass.     Tenderness: There is no abdominal tenderness. There is no guarding or rebound.     Comments: Positive  for splenomegaly.  Musculoskeletal:        General: No tenderness. Normal range of motion.     Cervical back: Normal range of motion and neck supple.  Skin:    General: Skin is warm.  Neurological:     Mental Status: He is  alert and oriented to person, place, and time.  Psychiatric:        Mood and Affect: Affect normal.      LABORATORY DATA:  I have reviewed the data as listed Lab Results  Component Value Date   WBC 1.7 (L) 09/12/2020   HGB 11.5 (L) 09/12/2020   HCT 35.2 (L) 09/12/2020   MCV 78.9 (L) 09/12/2020   PLT 51 (L) 09/12/2020   Recent Labs    06/25/20 1611 06/25/20 1611 06/27/20 0632 08/19/20 1508 09/12/20 1013  NA 136   < > 136 140 136  K 3.5   < > 3.1* 3.4* 3.1*  CL 95*   < > 97* 99 98  CO2 26   < > 28 28 30   GLUCOSE 120*   < > 99 95 161*  BUN 7*   < > 9 9 11   CREATININE 0.77   < > 0.71 0.81 0.79  CALCIUM 9.4   < > 9.1 9.3 8.9  GFRNONAA >60   < > >60 85 >60  GFRAA >60  --  >60 98  --   PROT 7.6  --   --  6.8 7.2  ALBUMIN 4.6  --   --  4.5 4.3  AST 30  --   --  23 26  ALT 17  --   --  13 16  ALKPHOS 86  --   --  87 71  BILITOT 1.6*  --   --  0.9 1.4*   < > = values in this interval not displayed.     US Abdomen Complete  Result Date: 08/26/2020 CLINICAL DATA:  Abdominal distension EXAM: ABDOMEN ULTRASOUND COMPLETE COMPARISON:  May 26, 2019 FINDINGS: Gallbladder: Within the gallbladder, there are multiple echogenic foci which move and shadow consistent with cholelithiasis. Largest gallstone measures 5 mm in length. No gallbladder wall thickening or pericholecystic fluid. No sonographic Murphy sign noted by sonographer. Common bile duct: Diameter: 6 mm. No intrahepatic, common hepatic, or common bile duct dilatation. Liver: No focal lesion identified. Liver echogenicity is inhomogeneous and overall increased. Portal vein is patent on color Doppler imaging with normal direction of blood flow towards the liver. IVC: No abnormality visualized. Pancreas: No  pancreatic mass or inflammatory focus. Spleen: Spleen measures 18.8 x 18.7 x 11.1 cm with a measured splenic volume of 1,952 cubic cm. No focal splenic lesion or perisplenic fluid evident. Right Kidney: Length: 11.3 cm. Echogenicity within normal limits. No hydronephrosis visualized. There is a cyst in the mid right kidney measuring 1.4 x 1.3 x 1.2 cm. In the mid to lower pole region on the right, there is a cyst measuring 1.9 x 2.0 x 1.7 cm. Left Kidney: Length: 11.3 cm. Echogenicity within normal limits. No hydronephrosis visualized. There is a cyst in the mid left kidney measuring 2.5 x 2.4 x 2.3 cm. There are cysts more inferiorly measuring 1.4 x 1.3 x 1.1 cm 1.0 x 1.0 x 0.8 cm respectively. Abdominal aorta: No aneurysm visualized. Other findings: No demonstrable ascites. IMPRESSION: 1. Cholelithiasis. No gallbladder wall thickening or pericholecystic fluid. 2. Splenomegaly again noted with measured splenic volume of 1,952 cubic cm. No focal splenic lesions. 3. Liver echogenicity overall increased and somewhat inhomogeneous, likely due to hepatic steatosis with underlying parenchymal liver disease. No focal liver lesions are demonstrable on this study. 4.  Simple cysts in each kidney. Electronically Signed   By: Lowella Grip III M.D.   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  Worsening.  Given the recent worsening-I would recommend further evaluation with a bone marrow biopsy to rule out any primary bone marrow process-check MDS.  Discussed with the patient the bone marrow biopsy and aspiration indication and procedure at length.  Given significant discomfort involved-I would recommend under anesthesia/with radiology in the hospital. I discussed the potential complications include-bleeding/trauma and risk of infection; which are fortunately very rare.   Patient is in agreement. Patient will sign the consent prior to the procedure. Bone marrow biopsy/aspiration is ordered.   #A. fib on on aspirin; Xarelto discontinued [recent fall intracranial bleed]-stable.  #Cirrhosis child Pugh a- no evidence of any decompensation.  Recommend screening ultrasound.  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  2021 PSA less than 0.1-continue follow-up care as per urology.  DISPOSITION: # Bone marrow Biopsy in 1 week # follow up in 2 weeks after the bone marrow- MD; no labs- Dr.B  Cc: Dr.Fisher-     Cammie Sickle, MD 09/12/2020 12:15 PM

## 2020-09-12 NOTE — Assessment & Plan Note (Addendum)
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  Worsening.  Given the recent worsening-I would recommend further evaluation with a bone marrow biopsy to rule out any primary bone marrow process-check MDS.  Discussed with the patient the bone marrow biopsy and aspiration indication and procedure at length.  Given significant discomfort involved-I would recommend under anesthesia/with radiology in the hospital. I discussed the potential complications include-bleeding/trauma and risk of infection; which are fortunately very rare.  Patient is in agreement. Patient will sign the consent prior to the procedure. Bone marrow biopsy/aspiration is ordered.   #A. fib on on aspirin; Xarelto discontinued [recent fall intracranial bleed]-stable.  #Cirrhosis child Pugh a- no evidence of any decompensation.  Recommend screening ultrasound.  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  2021 PSA less than 0.1-continue follow-up care as per urology.  DISPOSITION: # Bone marrow Biopsy in 1 week # follow up in 2 weeks after the bone marrow- MD; no labs- Dr.B  Cc: Dr.Fisher-

## 2020-09-12 NOTE — Progress Notes (Signed)
Pt in for follow up, denies any concerns or difficulties today. Pt states no longer taking xarelto and started on aspirin 81mg .

## 2020-09-12 NOTE — Telephone Encounter (Signed)
Left detailed message on patients home phone per patients request with the date and time of bone marrow biopsy. Will call back if he does not understand and can get to a point to write down the information. Pt was in the car when I called.   Appointment: 11/1 Time: 7:30 for an 8:30 appt

## 2020-09-13 ENCOUNTER — Other Ambulatory Visit: Payer: Self-pay | Admitting: Student

## 2020-09-13 LAB — PSA, TOTAL AND FREE
PSA, Free Pct: UNDETERMINED %
PSA, Free: <0.01 ng/mL
Prostate Specific Ag, Serum: <0.1 ng/mL (ref 0.0–4.0)

## 2020-09-13 LAB — AFP TUMOR MARKER: AFP, Serum, Tumor Marker: 2 ng/mL (ref 0.0–8.3)

## 2020-09-16 ENCOUNTER — Ambulatory Visit
Admission: RE | Admit: 2020-09-16 | Discharge: 2020-09-16 | Disposition: A | Discharge status: Home / Self Care | Payer: PPO | Source: Ambulatory Visit | Attending: Internal Medicine | Admitting: Internal Medicine

## 2020-09-16 ENCOUNTER — Telehealth: Payer: Self-pay | Admitting: *Deleted

## 2020-09-16 ENCOUNTER — Encounter: Payer: Self-pay | Admitting: *Deleted

## 2020-09-16 ENCOUNTER — Other Ambulatory Visit: Payer: Self-pay

## 2020-09-16 DIAGNOSIS — D61818 Other pancytopenia: Secondary | ICD-10-CM | POA: Insufficient documentation

## 2020-09-16 DIAGNOSIS — Z87891 Personal history of nicotine dependence: Secondary | ICD-10-CM | POA: Insufficient documentation

## 2020-09-16 DIAGNOSIS — Z8546 Personal history of malignant neoplasm of prostate: Secondary | ICD-10-CM | POA: Diagnosis not present

## 2020-09-16 DIAGNOSIS — D696 Thrombocytopenia, unspecified: Secondary | ICD-10-CM | POA: Diagnosis not present

## 2020-09-16 DIAGNOSIS — Z9079 Acquired absence of other genital organ(s): Secondary | ICD-10-CM | POA: Insufficient documentation

## 2020-09-16 DIAGNOSIS — D7589 Other specified diseases of blood and blood-forming organs: Secondary | ICD-10-CM | POA: Diagnosis not present

## 2020-09-16 DIAGNOSIS — Z8619 Personal history of other infectious and parasitic diseases: Secondary | ICD-10-CM | POA: Diagnosis not present

## 2020-09-16 DIAGNOSIS — D72819 Decreased white blood cell count, unspecified: Secondary | ICD-10-CM | POA: Insufficient documentation

## 2020-09-16 DIAGNOSIS — D731 Hypersplenism: Secondary | ICD-10-CM | POA: Insufficient documentation

## 2020-09-16 DIAGNOSIS — Z7982 Long term (current) use of aspirin: Secondary | ICD-10-CM | POA: Diagnosis not present

## 2020-09-16 DIAGNOSIS — Z888 Allergy status to other drugs, medicaments and biological substances status: Secondary | ICD-10-CM | POA: Insufficient documentation

## 2020-09-16 DIAGNOSIS — D649 Anemia, unspecified: Secondary | ICD-10-CM | POA: Insufficient documentation

## 2020-09-16 DIAGNOSIS — Z79899 Other long term (current) drug therapy: Secondary | ICD-10-CM | POA: Insufficient documentation

## 2020-09-16 DIAGNOSIS — C61 Malignant neoplasm of prostate: Secondary | ICD-10-CM | POA: Insufficient documentation

## 2020-09-16 DIAGNOSIS — Z8673 Personal history of transient ischemic attack (TIA), and cerebral infarction without residual deficits: Secondary | ICD-10-CM | POA: Diagnosis not present

## 2020-09-16 DIAGNOSIS — K7460 Unspecified cirrhosis of liver: Secondary | ICD-10-CM | POA: Insufficient documentation

## 2020-09-16 DIAGNOSIS — Z89029 Acquired absence of unspecified finger(s): Secondary | ICD-10-CM | POA: Insufficient documentation

## 2020-09-16 LAB — CBC WITH DIFFERENTIAL/PLATELET
Abs Immature Granulocytes: 0.01 10*3/uL (ref 0.00–0.07)
Basophils Absolute: 0 10*3/uL (ref 0.0–0.1)
Basophils Relative: 1 %
Eosinophils Absolute: 0.1 10*3/uL (ref 0.0–0.5)
Eosinophils Relative: 5 %
HCT: 35.1 % — ABNORMAL LOW (ref 39.0–52.0)
Hemoglobin: 11.3 g/dL — ABNORMAL LOW (ref 13.0–17.0)
Immature Granulocytes: 1 %
Lymphocytes Relative: 25 %
Lymphs Abs: 0.5 10*3/uL — ABNORMAL LOW (ref 0.7–4.0)
MCH: 25.6 pg — ABNORMAL LOW (ref 26.0–34.0)
MCHC: 32.2 g/dL (ref 30.0–36.0)
MCV: 79.4 fL — ABNORMAL LOW (ref 80.0–100.0)
Monocytes Absolute: 0.1 10*3/uL (ref 0.1–1.0)
Monocytes Relative: 8 %
Neutro Abs: 1.1 10*3/uL — ABNORMAL LOW (ref 1.7–7.7)
Neutrophils Relative %: 60 %
Platelets: 47 10*3/uL — ABNORMAL LOW (ref 150–400)
RBC: 4.42 MIL/uL (ref 4.22–5.81)
RDW: 13.5 % (ref 11.5–15.5)
WBC: 1.9 10*3/uL — ABNORMAL LOW (ref 4.0–10.5)
nRBC: 0 % (ref 0.0–0.2)

## 2020-09-16 MED ORDER — FENTANYL CITRATE (PF) 100 MCG/2ML IJ SOLN
INTRAMUSCULAR | Status: AC | PRN
Start: 2020-09-16 — End: 2020-09-16
  Administered 2020-09-16: 50 ug via INTRAVENOUS

## 2020-09-16 MED ORDER — MIDAZOLAM HCL 2 MG/2ML IJ SOLN
INTRAMUSCULAR | Status: AC | PRN
  Administered 2020-09-16: 1 mg via INTRAVENOUS

## 2020-09-16 MED ORDER — MIDAZOLAM HCL 2 MG/2ML IJ SOLN
INTRAMUSCULAR | Status: AC
  Filled 2020-09-16: qty 2

## 2020-09-16 MED ORDER — SODIUM CHLORIDE 0.9 % IV SOLN: INTRAVENOUS | Status: DC

## 2020-09-16 MED ORDER — FENTANYL CITRATE (PF) 100 MCG/2ML IJ SOLN
INTRAMUSCULAR | Status: AC
  Filled 2020-09-16: qty 2

## 2020-09-16 MED ORDER — HEPARIN SOD (PORK) LOCK FLUSH 100 UNIT/ML IV SOLN
INTRAVENOUS | Status: AC
  Filled 2020-09-16: qty 5

## 2020-09-16 NOTE — Progress Notes (Signed)
Patient clinically stable post BMB per Dr Anselm Pancoast, tolerated well. Vitals stable pre and post procedure. Awake/alert and oriented post procedure. Wife at bedside. Discharge instructions given to wife and patient. 2x2 dressing dry/intact. Denies complaints at this time. Report given to Humberto Seals post procedure in specials.

## 2020-09-16 NOTE — Progress Notes (Signed)
SR on monitor  Patient states he is not taking xarelto.

## 2020-09-16 NOTE — Progress Notes (Signed)
Patient awake/alertx4. No c/o's  Lungs CTA. Has been NPO. Reviewed procedure and post op. Verbalizes understanding.

## 2020-09-16 NOTE — Procedures (Signed)
Interventional Radiology Procedure:   Indications: Thrombocytopenia moderate [50s];mild anemia/leukopenia [ANC-1.2 ]-secondary to hypersplenism/splenomegaly/cirrhosis.   Procedure: CT guided bone marrow biopsy  Findings: 2 aspirates and 2 cored from right ilium  Complications: None     EBL: Minimal, less than 10 ml  Plan: Discharge to home in one hour.   Gwendelyn Lanting R. Anselm Pancoast, MD  Pager: [phone]

## 2020-09-16 NOTE — Telephone Encounter (Signed)
Called report  IMPRESSION: 1. Successful CT-guided bone marrow aspirations and core biopsies. 2. Small amount of fluid in the left lower abdomen and there may be mild mesenteric edema particularly in the right lower quadrant. These findings are incompletely evaluated and could be further characterized with a dedicated CT of the abdomen and pelvis.  These results will be called to the ordering clinician or representative by the Radiologist Assistant, and communication documented in the PACS or [employer].   Electronically Signed   By: Markus Daft M.D.   On: 09/16/2020 11:29

## 2020-09-16 NOTE — H&P (Signed)
Chief Complaint: Patient was seen in consultation today for pancytopenia  Referring Physician(s): Cammie Sickle  Supervising Physician: Markus Daft  Patient Status: [facility] - Out-pt  History of Present Illness: COURAGE BIGLOW is a 79 y.o. male with past medical history of a fib, prostate cancer, HTN, cirrhosis s/p recent fall with subdural hematoma who was found to have leukopenia/thrombocytopenia. He was referred to Dr. Aletha Halim office who has initiated further work-up and requests a bone marrow biopsy for pancytopenia.   Mr. Horrigan presents for procedure today in his usual state of health.  He is not in a fib today.  He denies new concerns or symptoms. He has been NPO.  He has held his Xarelto since discharge from the hospital due to intracranial bleed.   Past Medical History:  Diagnosis Date  . Atrial fibrillation ([place])   . Basal cell carcinoma   . Benign paroxysmal positional vertigo   . Cancer ([place])   . History of chicken pox   . History of measles   . History of mumps   . Hypertension   . Mononeuritis of unspecified site   . Other seborrheic keratosis   . Stroke ([place])   . 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 Traumatic subdural hematoma without loss of consciousness ([place]) 06/25/2020  . Unspecified hypothyroidism     Past Surgical History:  Procedure Laterality Date  . Carotid Doppler Ultrasound  04/02/2010   Small amount Calcified plaque bilaterally, no significant stenosis.  . COLONOSCOPY  05/11/2014   Tubular Adenoma. Dr. Allen Norris  . COLONOSCOPY WITH PROPOFOL N/A 05/23/2018   Procedure: COLONOSCOPY WITH PROPOFOL;  Surgeon: [name], MD;  Location: [facility] ENDOSCOPY;  Service: Gastroenterology;  Laterality: N/A;  . DOPPLER ECHOCARDIOGRAPHY  04/02/2010   Mild left atrial dilation. Normal right atrium. No valvular disease. No thrombus. Normal LV function. EF>55%  . ESOPHAGOGASTRODUODENOSCOPY (EGD) WITH  PROPOFOL N/A 08/16/2019   Procedure: ESOPHAGOGASTRODUODENOSCOPY (EGD) WITH PROPOFOL;  Surgeon: [name], MD;  Location: [facility] ENDOSCOPY;  Service: Gastroenterology;  Laterality: N/A;  . FINGER AMPUTATION    . HERNIA REPAIR    . MRI Brain with and without contrast  03/20/2010   Suggestive of basal ganglia lacunar infarct  . PROSTATECTOMY  1995   Abdominal  . SKIN SURGERY     multiple  . Thumb surgery      Allergies: Lisinopril, Myrbetriq [mirabegron], Atorvastatin, Dabigatran etexilate mesylate, and Dabigatran etexilate mesylate  Medications: Prior to Admission medications   Medication Sig Start Date End Date Taking? Authorizing Provider  Biotin 10 MG TABS Take 8 mg by mouth daily.    Yes [provider]  calcium-vitamin D (OSCAL) 250-125 MG-UNIT per tablet Take 1 tablet by mouth 2 (two) times daily.    Yes [provider]  chlorthalidone (HYGROTON) 25 MG tablet Take 1 tablet (25 mg total) by mouth daily. Patient taking differently: Take 25 mg by mouth every other day.  07/15/17  Yes Minna Merritts, MD  Cyanocobalamin (VITAMIN B-12 PO) Take by mouth daily.   Yes [provider]  flecainide (TAMBOCOR) 50 MG tablet Take 1 tablet (50 mg total) by mouth 2 (two) times daily. 07/15/17  Yes Minna Merritts, MD  gabapentin (NEURONTIN) 100 MG capsule Take 1 capsule (100 mg total) by mouth 3 (three) times daily. Patient taking differently: Take 100 mg by mouth 2 (two) times daily. 1 (100 mg) capsule in the Mornings &  2 (200 mg) capsules at Night  09/26/19  Yes Birdie Sons, MD  losartan (COZAAR) 25 MG tablet Take by mouth. 08/20/20 08/20/21 Yes [provider]  Potassium 99 MG TABS Take by mouth.   Yes [provider]  traMADol (ULTRAM) 50 MG tablet Take 1 tablet (50 mg total) by mouth every 6 (six) hours as needed. 08/06/20  Yes Birdie Sons, MD  aspirin 81 MG chewable tablet Chew by mouth daily.    [provider]  diltiazem  (CARDIZEM) 30 MG tablet Take 1 tablet (30 mg total) by mouth once daily as needed (palpitations) Patient not taking: Reported on 09/12/2020 06/08/20   [provider]  EPINEPHrine (EPIPEN 2-PAK) 0.3 mg/0.3 mL IJ SOAJ injection Inject 0.3 mg into the muscle once.  Patient not taking: Reported on 09/12/2020 02/12/15   [provider]  Leuprolide Acetate, 6 Month, (LUPRON) 45 MG injection Inject 45 mg into the muscle every 6 (six) months.    [provider]  omeprazole (PRILOSEC) 40 MG capsule TAKE ONE CAPSULE DAILY. Patient taking differently: Take 40 mg by mouth daily.  06/06/18   Birdie Sons, MD  zolpidem (AMBIEN) 10 MG tablet Take 0.5-1 tablets (5-10 mg total) by mouth at bedtime as needed. 05/18/20   Birdie Sons, MD     Family History  Problem Relation Age of Onset  . Heart disease Mother 60       CABG  . Hypertension Mother   . Heart attack Mother   . Hypertension Brother   . Heart disease Sister        stents  . Prostate cancer Brother   . Kidney cancer Brother   . Skin cancer Brother   . Hypertension Brother   . Bladder Cancer Sister   . Leukemia Father   . Stroke Father        multiple  . Cerebral aneurysm Sister   . Kidney cancer Brother     Social History   Socioeconomic History  . Marital status: Married    Spouse name: Not on file  . Number of children: 2  . Years of education: Not on file  . Highest education level: Associate degree: occupational, Hotel manager, or vocational program  Occupational History  . Occupation: Retired  Tobacco Use  . Smoking status: Former Smoker    Packs/day: 0.00    Years: 12.00    Pack years: 0.00    Types: Cigarettes  . Smokeless tobacco: Current User    Types: Chew  . Tobacco comment: quit over 45 years ago  Vaping Use  . Vaping Use: Never used  Substance and Sexual Activity  . Alcohol use: Not Currently    Alcohol/week: 5.0 standard drinks    Types: 5 Cans of beer per week    Comment: quit  drinking summer 2021  . Drug use: No  . Sexual activity: Not on file  Other Topics Concern  . 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 Difficulty of Paying Living Expenses: Not hard at all  Food Insecurity: No Food Insecurity  . Worried About Charity fundraiser in the Last Year: Never true  . Ran Out of Food in the Last Year: Never true  Transportation Needs: No Transportation Needs  . Lack of Transportation (Medical): No  . Lack of Transportation (  Non-Medical): No  Physical Activity: Inactive  . Days of Exercise per Week: 0 days  . Minutes of Exercise per Session: 0 min  Stress: No Stress Concern Present  . Feeling of Stress : Not at all  Social Connections: Moderately Isolated  . Frequency of Communication with Friends and Family: More than three times a week  . Frequency of Social Gatherings with Friends and Family: More than three times a week  . Attends Religious Services: Never  . Active Member of Clubs or Organizations: No  . Attends Archivist Meetings: Never  . Marital Status: Married     Review of Systems: A 12 point ROS discussed and pertinent positives are indicated in the HPI above.  All other systems are negative.  Review of Systems  Constitutional: Negative for fatigue and fever.  Respiratory: Negative for cough and shortness of breath.   Cardiovascular: Negative for chest pain.  Gastrointestinal: Negative for abdominal pain.  Musculoskeletal: Negative for back pain.  Psychiatric/Behavioral: Negative for behavioral problems and confusion.    Vital Signs: There were no vitals taken for this visit.  Physical Exam Vitals and nursing note reviewed.  Constitutional:      General: He is not in acute distress.    Appearance: Normal appearance. He is not ill-appearing.   HENT:     Mouth/Throat:     Mouth: Mucous membranes are moist.     Pharynx: Oropharynx is clear.  Cardiovascular:     Rate and Rhythm: Normal rate and regular rhythm.  Pulmonary:     Effort: Pulmonary effort is normal. No respiratory distress.     Breath sounds: Normal breath sounds.  Skin:    General: Skin is warm and dry.  Neurological:     General: No focal deficit present.     Mental Status: He is alert and oriented to person, place, and time. Mental status is at baseline.  Psychiatric:        Mood and Affect: Mood normal.        Behavior: Behavior normal.        Thought Content: Thought content normal.        Judgment: Judgment normal.      MD Evaluation Airway: WNL Heart: WNL Abdomen: WNL Chest/ Lungs: WNL ASA  Classification: 3 Mallampati/Airway Score: Two   Imaging: US Abdomen Complete  Result Date: 08/26/2020 CLINICAL DATA:  Abdominal distension EXAM: ABDOMEN ULTRASOUND COMPLETE COMPARISON:  May 26, 2019 FINDINGS: Gallbladder: Within the gallbladder, there are multiple echogenic foci which move and shadow consistent with cholelithiasis. Largest gallstone measures 5 mm in length. No gallbladder wall thickening or pericholecystic fluid. No sonographic Murphy sign noted by sonographer. Common bile duct: Diameter: 6 mm. No intrahepatic, common hepatic, or common bile duct dilatation. Liver: No focal lesion identified. Liver echogenicity is inhomogeneous and overall increased. Portal vein is patent on color Doppler imaging with normal direction of blood flow towards the liver. IVC: No abnormality visualized. Pancreas: No pancreatic mass or inflammatory focus. Spleen: Spleen measures 18.8 x 18.7 x 11.1 cm with a measured splenic volume of 1,952 cubic cm. No focal splenic lesion or perisplenic fluid evident. Right Kidney: Length: 11.3 cm. Echogenicity within normal limits. No hydronephrosis visualized. There is a cyst in the mid right kidney measuring 1.4 x 1.3 x 1.2 cm. In  the mid to lower pole region on the right, there is a cyst measuring 1.9 x 2.0 x 1.7 cm. Left Kidney: Length: 11.3 cm. Echogenicity within normal limits. No hydronephrosis  visualized. There is a cyst in the mid left kidney measuring 2.5 x 2.4 x 2.3 cm. There are cysts more inferiorly measuring 1.4 x 1.3 x 1.1 cm 1.0 x 1.0 x 0.8 cm respectively. Abdominal aorta: No aneurysm visualized. Other findings: No demonstrable ascites. IMPRESSION: 1. Cholelithiasis. No gallbladder wall thickening or pericholecystic fluid. 2. Splenomegaly again noted with measured splenic volume of 1,952 cubic cm. No focal splenic lesions. 3. Liver echogenicity overall increased and somewhat inhomogeneous, likely due to hepatic steatosis with underlying parenchymal liver disease. No focal liver lesions are demonstrable on this study. 4.  Simple cysts in each kidney. Electronically Signed   By: Lowella Grip III M.D.   On: 08/26/2020 16:36    Labs:  CBC: Recent Labs    06/27/20 0632 08/19/20 1508 09/12/20 1013 09/16/20 0750  WBC 2.4* 2.5* 1.7* 1.9*  HGB 11.9* 12.2* 11.5* 11.3*  HCT 35.4* 36.6* 35.2* 35.1*  PLT 59* 47* 51* 47*    COAGS: Recent Labs    06/25/20 1611 06/26/20 0555 08/19/20 1508  INR 1.8*  --  1.2  APTT  --  32  --     BMP: Recent Labs    05/09/20 1352 05/09/20 1352 06/25/20 1611 06/27/20 0632 08/19/20 1508 09/12/20 1013  NA 137   < > 136 136 140 136  K 4.0   < > 3.5 3.1* 3.4* 3.1*  CL 97*   < > 95* 97* 99 98  CO2 28   < > 26 28 28 30   GLUCOSE 127*   < > 120* 99 95 161*  BUN 7*   < > 7* 9 9 11   CALCIUM 9.0   < > 9.4 9.1 9.3 8.9  CREATININE 0.87   < > 0.77 0.71 0.81 0.79  GFRNONAA >60   < > >60 >60 85 >60  GFRAA >60  --  >60 >60 98  --    < > = values in this interval not displayed.    LIVER FUNCTION TESTS: Recent Labs    12/06/19 1256 06/25/20 1611 08/19/20 1508 09/12/20 1013  BILITOT 1.0 1.6* 0.9 1.4*  AST 30 30 23 26   ALT 19 17 13 16   ALKPHOS 66 86 87 71  PROT 7.2  7.6 6.8 7.2  ALBUMIN 4.1 4.6 4.5 4.3    TUMOR MARKERS: No results for input(s): AFPTM, CEA, CA199, CHROMGRNA in the last 8760 hours.  Assessment and Plan: Patient with past medical history of a fib, HTN, cirrhosis, presents with complaint of worsening pancytopenia identified after recent fall resulting in subdural hematoma.  IR consulted for bone marrow biopsy at the request of Dr. Rogue Bussing. Case reviewed by Dr. Anselm Pancoast who approves patient for procedure.  Patient presents today in their usual state of health.  He has been NPO and is not currently on blood thinners.    Risks and benefits of biopsy was discussed with the patient and/or patient's family including, but not limited to bleeding, infection, damage to adjacent structures or low yield requiring additional tests.  All of the questions were answered and there is agreement to proceed.  Consent signed and in chart.  Thank you for this interesting consult.  I greatly enjoyed meeting DANN GALICIA and look forward to participating in their care.  A copy of this report was sent to the requesting provider on this date.  Electronically Signed: Docia Barrier, PA 09/16/2020, 8:41 AM   I spent a total of  30 Minutes   in face to  face in clinical consultation, greater than 50% of which was counseling/coordinating care for pancytopenia.

## 2020-09-16 NOTE — Discharge Instructions (Signed)
Bone Marrow Aspiration and Bone Marrow Biopsy, Adult, Care After This sheet gives you information about how to care for yourself after your procedure. If you have problems or questions, contact your health care provider.  What can I expect after the procedure?  After the procedure, it is common to have:  Mild pain and tenderness.  Swelling.  Bruising.  Follow these instructions at home:  Take over-the-counter or prescription medicines only as told by your health care provider.  You may shower tomorrow ? Remove band aid tomorrow, replace with another bandaid if  site has any drainage from biopsy site. ? Wash your hands with soap and water before you touch your biopsy site  If soap and water are not available, use hand sanitizer. ? Change your dressing frequently for bleeding and/or drainage.  Check your puncture site every day for signs of infection. Check for: ? More redness, swelling, or pain. ? More fluid or blood. ? Warmth. ? Pus or a bad smell.  Return to your normal activities in 24hours.   Do not drive for 24 hours if you were given a medicine to help you relax (sedative).  Keep all follow-up visits as told by your health care provider. This is important. Contact a health care provider if:  You have more redness, swelling, or pain around the puncture site.  You have more fluid or blood coming from the puncture site.  Your puncture site feels warm to the touch.  You have pus or a bad smell coming from the puncture site.  You have a fever.  Your pain is not controlled with medicine. This information is not intended to replace advice given to you by your health care provider. Make sure you discuss any questions you have with your health care provider. Document Released: 05/22/2005 Document Revised: 05/22/2016 Document Reviewed: 04/15/2016 Elsevier Interactive Patient Education  2018 [employer].

## 2020-09-17 ENCOUNTER — Telehealth: Payer: Self-pay | Admitting: Internal Medicine

## 2020-09-17 NOTE — Telephone Encounter (Signed)
See md phone note 

## 2020-09-17 NOTE — Telephone Encounter (Signed)
On 11/1--spoke to patient regarding the incidental findings-mesenteric/colonic noted on the CT scan done for bone marrow biopsy.  Patient is asymptomatic-denies any symptoms.  Patient asked to call us if he develops any abdominal symptoms.  Recommend follow-up as planned to discuss the results f bone marrow biopsy as planned.   GB

## 2020-09-23 ENCOUNTER — Encounter (HOSPITAL_COMMUNITY): Payer: Self-pay | Admitting: Internal Medicine

## 2020-09-23 LAB — SURGICAL PATHOLOGY

## 2020-09-26 ENCOUNTER — Other Ambulatory Visit: Payer: Self-pay | Admitting: Family Medicine

## 2020-09-26 NOTE — Telephone Encounter (Signed)
Requested medication (s) are due for refill today -no  Requested medication (s) are on the active medication list -no  Future visit scheduled -no  Last refill: 02/01/19  Notes to clinic: Request non delegated Rx- expired Rx  Requested Prescriptions  Pending Prescriptions Disp Refills   cyclobenzaprine (FLEXERIL) 10 MG tablet [Pharmacy Med Name: CYCLOBENZAPRINE 10 MG TABLET] 30 tablet 0    Sig: Take 0.5 tablets (5 mg total) by mouth 2 (two) times daily as needed.      Not Delegated - Analgesics:  Muscle Relaxants Failed - 09/26/2020 12:34 PM      Failed - This refill cannot be delegated      Passed - Valid encounter within last 6 months    Recent Outpatient Visits           1 month ago Annual physical exam   [facility] Birdie Sons, MD   2 months ago Traumatic subdural hematoma without loss of consciousness, initial encounter [facility])   [facility] Birdie Sons, MD   1 year ago Essential hypertension   [facility] Birdie Sons, MD   1 year ago Metatarsalgia of both feet   [facility] Birdie Sons, MD   1 year ago Neuropathy   [place], [place]       Future Appointments             In 2 months McGowan, Gordan Payment [facility]                Requested Prescriptions  Pending Prescriptions Disp Refills   cyclobenzaprine (FLEXERIL) 10 MG tablet [Pharmacy Med Name: CYCLOBENZAPRINE 10 MG TABLET] 30 tablet 0    Sig: Take 0.5 tablets (5 mg total) by mouth 2 (two) times daily as needed.      Not Delegated - Analgesics:  Muscle Relaxants Failed - 09/26/2020 12:34 PM      Failed - This refill cannot be delegated      Passed - Valid encounter within last 6 months    Recent Outpatient Visits           1 month ago Annual physical exam   [facility] Birdie Sons, MD   2 months ago Traumatic subdural hematoma without loss of  consciousness, initial encounter [facility])   [facility] Birdie Sons, MD   1 year ago Essential hypertension   [facility] Birdie Sons, MD   1 year ago Metatarsalgia of both feet   [facility] Birdie Sons, MD   1 year ago Neuropathy   [facility] Birdie Sons, MD       Future Appointments             In 2 months McGowan, Gordan Payment [facility]

## 2020-09-27 ENCOUNTER — Encounter: Payer: Self-pay | Admitting: Internal Medicine

## 2020-09-27 ENCOUNTER — Encounter: Payer: Self-pay | Admitting: *Deleted

## 2020-09-27 NOTE — Progress Notes (Signed)
Patient called for pre assessment. Denies any pain or concerns at this time.  °

## 2020-09-30 ENCOUNTER — Encounter: Payer: Self-pay | Admitting: Internal Medicine

## 2020-09-30 ENCOUNTER — Other Ambulatory Visit: Payer: Self-pay | Admitting: Family Medicine

## 2020-09-30 ENCOUNTER — Inpatient Hospital Stay: Payer: PPO | Attending: Internal Medicine | Admitting: Internal Medicine

## 2020-09-30 DIAGNOSIS — K7460 Unspecified cirrhosis of liver: Secondary | ICD-10-CM | POA: Diagnosis not present

## 2020-09-30 DIAGNOSIS — M549 Dorsalgia, unspecified: Secondary | ICD-10-CM | POA: Insufficient documentation

## 2020-09-30 DIAGNOSIS — M2550 Pain in unspecified joint: Secondary | ICD-10-CM | POA: Diagnosis not present

## 2020-09-30 DIAGNOSIS — Z8673 Personal history of transient ischemic attack (TIA), and cerebral infarction without residual deficits: Secondary | ICD-10-CM | POA: Diagnosis not present

## 2020-09-30 DIAGNOSIS — D61818 Other pancytopenia: Secondary | ICD-10-CM | POA: Diagnosis not present

## 2020-09-30 DIAGNOSIS — I10 Essential (primary) hypertension: Secondary | ICD-10-CM | POA: Insufficient documentation

## 2020-09-30 DIAGNOSIS — Z85828 Personal history of other malignant neoplasm of skin: Secondary | ICD-10-CM | POA: Insufficient documentation

## 2020-09-30 DIAGNOSIS — G629 Polyneuropathy, unspecified: Secondary | ICD-10-CM

## 2020-09-30 DIAGNOSIS — E039 Hypothyroidism, unspecified: Secondary | ICD-10-CM | POA: Diagnosis not present

## 2020-09-30 DIAGNOSIS — D696 Thrombocytopenia, unspecified: Secondary | ICD-10-CM | POA: Diagnosis not present

## 2020-09-30 DIAGNOSIS — C61 Malignant neoplasm of prostate: Secondary | ICD-10-CM | POA: Insufficient documentation

## 2020-09-30 DIAGNOSIS — Z7982 Long term (current) use of aspirin: Secondary | ICD-10-CM | POA: Insufficient documentation

## 2020-09-30 DIAGNOSIS — I4891 Unspecified atrial fibrillation: Secondary | ICD-10-CM | POA: Diagnosis not present

## 2020-09-30 DIAGNOSIS — Z87891 Personal history of nicotine dependence: Secondary | ICD-10-CM | POA: Diagnosis not present

## 2020-09-30 DIAGNOSIS — Z79899 Other long term (current) drug therapy: Secondary | ICD-10-CM | POA: Diagnosis not present

## 2020-09-30 NOTE — Progress Notes (Signed)
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 Vicente Males   Prostate cancer [facility]) (Resolved)  06/20/2015 Initial Diagnosis   Prostate cancer ([place])   Prostate cancer ([place])  09/12/2020 Initial Diagnosis   Prostate cancer ([place])      HISTORY OF PRESENTING ILLNESS:  Ian Sanders 79 y.o.  male pleasant patient with history of alcohol use /cirrhosis /A. Fib-is here for follow-up to review the results of the bone marrow biopsy given his worsening thrombocytopenia and leukopenia.  Post biopsy-no complications noted.  Healing well.  No falls.  Continues to have joint pains back pain.   Review of Systems  Constitutional: Positive for malaise/fatigue. Negative for chills, diaphoresis, fever and weight loss.  HENT: Negative for nosebleeds and sore throat.   Eyes: Negative for double vision.  Respiratory: Negative for cough, hemoptysis, sputum production, shortness of breath and wheezing.   Cardiovascular: Negative for chest pain, palpitations, orthopnea and leg swelling.  Gastrointestinal: Negative for abdominal pain, blood in stool,  constipation, diarrhea, heartburn, melena, nausea and vomiting.  Genitourinary: Negative for dysuria, frequency and urgency.  Musculoskeletal: Positive for back pain and joint pain.  Skin: Negative.  Negative for itching and rash.  Neurological: Negative for dizziness, tingling, focal weakness, weakness and headaches.  Endo/Heme/Allergies: Does not bruise/bleed easily.  Psychiatric/Behavioral: Negative for depression. The patient is not nervous/anxious and does not have insomnia.      MEDICAL HISTORY:  Past Medical History:  Diagnosis Date  . Atrial fibrillation ([place])   . Basal cell carcinoma   . Benign paroxysmal positional vertigo   . Cancer ([place])   . History of chicken pox   . History of measles   . History of mumps   . Hypertension   . Mononeuritis of unspecified site   . Other seborrheic keratosis   . Stroke ([place])   . 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 Traumatic subdural hematoma without loss of consciousness ([place]) 06/25/2020  . Unspecified hypothyroidism     SURGICAL HISTORY: Past Surgical History:  Procedure Laterality Date  . Carotid Doppler Ultrasound  04/02/2010   Small amount Calcified plaque bilaterally, no significant stenosis.  . COLONOSCOPY  05/11/2014   Tubular Adenoma. Dr. Allen Norris  . COLONOSCOPY WITH PROPOFOL N/A 05/23/2018   Procedure: COLONOSCOPY WITH PROPOFOL;  Surgeon: [name], MD;  Location: [facility] ENDOSCOPY;  Service: Gastroenterology;  Laterality: N/A;  . DOPPLER ECHOCARDIOGRAPHY  04/02/2010   Mild left atrial dilation. Normal right atrium. No valvular disease. No thrombus. Normal LV function. EF>55%  . ESOPHAGOGASTRODUODENOSCOPY (EGD) WITH PROPOFOL N/A 08/16/2019   Procedure: ESOPHAGOGASTRODUODENOSCOPY (EGD) WITH PROPOFOL;  Surgeon: [name], MD;  Location: [facility] ENDOSCOPY;  Service: Gastroenterology;  Laterality: N/A;  . FINGER AMPUTATION    . HERNIA REPAIR    . MRI Brain  with and without  contrast  03/20/2010   Suggestive of basal ganglia lacunar infarct  . PROSTATECTOMY  1995   Abdominal  . SKIN SURGERY     multiple  . Thumb surgery      SOCIAL HISTORY: Social History   Socioeconomic History  . Marital status: Married    Spouse name: Not on file  . Number of children: 2  . Years of education: Not on file  . Highest education level: Associate degree: occupational, Hotel manager, or vocational program  Occupational History  . Occupation: Retired  Tobacco Use  . Smoking status: Former Smoker    Packs/day: 0.00    Years: 12.00    Pack years: 0.00    Types: Cigarettes  . Smokeless tobacco: Current User    Types: Chew  . Tobacco comment: quit over 45 years ago  Vaping Use  . Vaping Use: Never used  Substance and Sexual Activity  . Alcohol use: Not Currently    Alcohol/week: 5.0 standard drinks    Types: 5 Cans of beer per week    Comment: quit drinking summer 2021  . Drug use: No  . Sexual activity: Not on file  Other Topics Concern  . 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 Difficulty of Paying Living Expenses: Not hard at all  Food Insecurity: No Food Insecurity  . Worried About Charity fundraiser in the Last Year: Never true  . Ran Out of Food in the Last Year: Never true  Transportation Needs: No Transportation Needs  . Lack of Transportation (Medical): No  . Lack of Transportation (Non-Medical): No  Physical Activity: Inactive  . Days of Exercise per Week: 0 days  . Minutes of Exercise per Session: 0 min  Stress: No Stress Concern Present  . Feeling of Stress : Not at all  Social Connections: Moderately Isolated  . Frequency of Communication with Friends and Family: More than three times a week  . Frequency of Social Gatherings with Friends and Family: More than  three times a week  . Attends Religious Services: Never  . Active Member of Clubs or Organizations: No  . Attends Archivist Meetings: Never  . Marital Status: Married  Human resources officer Violence: Not At Risk  . Fear of Current or Ex-Partner: No  . Emotionally Abused: No  . Physically Abused: No  . Sexually Abused: No    FAMILY HISTORY: Family History  Problem Relation Age of Onset  . Heart disease Mother 99       CABG  . Hypertension Mother   . Heart attack Mother   . Hypertension Brother   . Heart disease Sister        stents  . Prostate cancer Brother   . Kidney cancer Brother   . Skin cancer Brother   . Hypertension Brother   . Bladder Cancer Sister   . Leukemia Father   . Stroke Father        multiple  . Cerebral aneurysm Sister   . Kidney cancer Brother     ALLERGIES:  is allergic to lisinopril, myrbetriq [mirabegron], other, amlodipine, atorvastatin, dabigatran etexilate mesylate, and dabigatran etexilate mesylate.  MEDICATIONS:  Current Outpatient Medications  Medication Sig Dispense Refill  . aspirin 81 MG chewable tablet Chew by mouth daily.    Marland Kitchen  Biotin 10 MG TABS Take 8 mg by mouth daily.     . calcium-vitamin D (OSCAL) 250-125 MG-UNIT per tablet Take 1 tablet by mouth 2 (two) times daily.     . chlorthalidone (HYGROTON) 25 MG tablet Take 1 tablet (25 mg total) by mouth daily. (Patient taking differently: Take 25 mg by mouth every other day. ) 90 tablet 3  . Cyanocobalamin (VITAMIN B-12 PO) Take by mouth daily.    . cyclobenzaprine (FLEXERIL) 10 MG tablet Take 0.5 tablets (5 mg total) by mouth 2 (two) times daily as needed. 30 tablet 2  . EPINEPHrine (EPIPEN 2-PAK) 0.3 mg/0.3 mL IJ SOAJ injection Inject 0.3 mg into the muscle once.     . fesoterodine (TOVIAZ) 4 MG TB24 tablet Take 4 mg by mouth as needed.    . flecainide (TAMBOCOR) 50 MG tablet Take 1 tablet (50 mg total) by mouth 2 (two) times daily. 180 tablet 3  . Leuprolide Acetate, 6 Month,  (LUPRON) 45 MG injection Inject 45 mg into the muscle every 6 (six) months.    Marland Kitchen losartan (COZAAR) 25 MG tablet Take 25 mg by mouth daily.     Marland Kitchen omeprazole (PRILOSEC) 40 MG capsule TAKE ONE CAPSULE DAILY. (Patient taking differently: Take 40 mg by mouth daily. ) 90 capsule 4  . Potassium 99 MG TABS Take by mouth.    . traMADol (ULTRAM) 50 MG tablet Take 1 tablet (50 mg total) by mouth every 6 (six) hours as needed. 30 tablet 4  . zolpidem (AMBIEN) 10 MG tablet Take 0.5-1 tablets (5-10 mg total) by mouth at bedtime as needed. 30 tablet 5  . diltiazem (CARDIZEM) 30 MG tablet Take 1 tablet (30 mg total) by mouth once daily as needed (palpitations) (Patient not taking: Reported on 09/12/2020)    . gabapentin (NEURONTIN) 100 MG capsule Take 1 capsule (100 mg total) by mouth 3 (three) times daily. 270 capsule 0   Current Facility-Administered Medications  Medication Dose Route Frequency Provider Last Rate Last Admin  . lidocaine (XYLOCAINE) 2 % jelly 1 application  1 application Urethral Once Stoioff, Scott C, MD         .  PHYSICAL EXAMINATION:   Vitals:   09/30/20 0920  BP: (!) 146/67  Pulse: 72  Resp: 16  Temp: (!) 96.9 F (36.1 C)  SpO2: 100%   Filed Weights   09/30/20 0920  Weight: 169 lb (76.7 kg)    Physical Exam HENT:     Head: Normocephalic and atraumatic.     Mouth/Throat:     Pharynx: No oropharyngeal exudate.  Eyes:     Pupils: Pupils are equal, round, and reactive to light.  Cardiovascular:     Rate and Rhythm: Normal rate and regular rhythm.  Pulmonary:     Effort: No respiratory distress.     Breath sounds: No wheezing.  Abdominal:     General: Bowel sounds are normal. There is no distension.     Palpations: Abdomen is soft. There is no mass.     Tenderness: There is no abdominal tenderness. There is no guarding or rebound.     Comments: Positive for splenomegaly.  Musculoskeletal:        General: No tenderness. Normal range of motion.     Cervical back:  Normal range of motion and neck supple.  Skin:    General: Skin is warm.  Neurological:     Mental Status: He is alert and oriented to person, place, and time.  Psychiatric:        Mood and Affect: Affect normal.      LABORATORY DATA:  I have reviewed the data as listed Lab Results  Component Value Date   WBC 1.9 (L) 09/16/2020   HGB 11.3 (L) 09/16/2020   HCT 35.1 (L) 09/16/2020   MCV 79.4 (L) 09/16/2020   PLT 47 (L) 09/16/2020   Recent Labs    06/25/20 1611 06/27/20 0632 08/19/20 1508 09/12/20 1013  NA 136 136 140 136  K 3.5 3.1* 3.4* 3.1*  CL 95* 97* 99 98  CO2 26 28 28 30   GLUCOSE 120* 99 95 161*  BUN 7* 9 9 11   CREATININE 0.77 0.71 0.81 0.79  CALCIUM 9.4 9.1 9.3 8.9  GFRNONAA >60 >60 85 >60  GFRAA >60 >60 98  --   PROT 7.6  --  6.8 7.2  ALBUMIN 4.6  --  4.5 4.3  AST 30  --  23 26  ALT 17  --  13 16  ALKPHOS 86  --  87 71  BILITOT 1.6*  --  0.9 1.4*     No results found.  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  November 2021 STABLE; bone marrow- NEG/reactive.  Continue surveillance.  #Iron deficient anemia-hemoglobin 11.3 recommend increased p.o. iron intake.   #A. fib on on aspirin; Xarelto discontinued [recent fall intracranial bleed]- STABLE.   #Cirrhosis child Pugh a- no evidence of any decompensation; discussed re: [employer] screening  # Prostate cancer/biochemical recurrence- PSA [Proastatectomy 1995;PSA- 9 started on Lupron] ; Shannon McGowan].  2021 PSA less than 0.1-continue follow-up care as per urology.  DISPOSITION: # follow up in 3 months- MD; labs-cbc/cmp;AFP; iron studies/ferritin- Dr.B  Cc: Dr.Fisher-     Cammie Sickle, MD 11/06/2020 12:44 AM

## 2020-09-30 NOTE — Assessment & Plan Note (Addendum)
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  November 2021 STABLE; bone marrow- NEG/reactive.  Continue surveillance.  #Iron deficient anemia-hemoglobin 11.3 recommend increased p.o. iron intake.   #A. fib on on aspirin; Xarelto discontinued [recent fall intracranial bleed]- STABLE.   #Cirrhosis child Pugh a- no evidence of any decompensation; discussed re: [place] screening  # Prostate cancer/biochemical recurrence- PSA [Proastatectomy 1995;PSA- 9 started on Lupron] ; Shannon McGowan].  2021 PSA less than 0.1-continue follow-up care as per urology.  DISPOSITION: # follow up in 3 months- MD; labs-cbc/cmp;AFP; iron studies/ferritin- Dr.B  Cc: Dr.Fisher-

## 2020-11-19 DIAGNOSIS — I480 Paroxysmal atrial fibrillation: Secondary | ICD-10-CM | POA: Diagnosis not present

## 2020-11-19 DIAGNOSIS — I700 Atherosclerosis of aorta: Secondary | ICD-10-CM | POA: Diagnosis not present

## 2020-11-19 DIAGNOSIS — I10 Essential (primary) hypertension: Secondary | ICD-10-CM | POA: Diagnosis not present

## 2020-11-19 DIAGNOSIS — E781 Pure hyperglyceridemia: Secondary | ICD-10-CM | POA: Diagnosis not present

## 2020-11-29 ENCOUNTER — Telehealth: Payer: Self-pay

## 2020-11-29 NOTE — Telephone Encounter (Signed)
No PA required for 2022, unless change in benefits.

## 2020-12-02 ENCOUNTER — Ambulatory Visit: Payer: Self-pay

## 2020-12-02 NOTE — Progress Notes (Signed)
01/24/2019 8:04 PM   Ian Sanders [birth year] [id number]  Referring provider: Birdie Sons, [place],  [place] [address]  Chief Complaint  Patient presents with  . Prostate Cancer   Urological history 1. Prostate cancer - Patient underwent RRP by Dr. Domingo Cocking at [facility] in 1995 - PSA remained below 1 until 2014 when it rose to 9.8 ng/mL - Started on ADT with [place] in 03/15 - PSA <0.1 ng/mL on 09/12/2020  2. High risk hematuria - Former smoker - CT urogram 01/31/2018 noted a 1.2 cm partially exophytic lesion within the inferior pole of the right kidney with suggestion of mild contrast enhancement, raising the possibility of solid renal neoplasm.  There is a 9 mm lesion within the medial superior left kidney with suggestion of possible associated enhancement.  No nephroureterolithiasis. No hydronephrosis. No abnormal filling defects within the opacified renal collecting systems and ureters.  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  Prominent vascularity and stranding lateral to the ascending colon favored to be sequelae of portal venous hypertension.  Recommend clinical correlation for signs of colitis.  Cholelithiasis.  Bilateral femoral head AVN.  There is a 5 mm right lung nodule - cysto 02/09/2018 NED - MRI on 05/17/2018 noted Bosniak category 1 and category 2 renal cysts in both kidneys. No suspicious renal masses - UA today negative for micro heme  3. Incontinence - taking Toviaz 4 mg daily - PVR 0 mL   HPI: Ian Sanders is a 80 y.o. male who presents today for follow up.  He states that the Toviaz 4 mg daily is no longer effective and reducing his urinary leakage.  He states he leaks 6-8 times during the day and once at night.  He goes through 1-2 pads daily.  He is experiencing dry mouth and constipation with the Toviaz.  He had been tried on Myrbetriq in the  past, but it was cost prohibitive.  I also looked up [place] and his insurance does not cover that medication.  He saw 3 to 5 large drops of blood in his underwear yesterday.  He did not see any blood in his urine stream, but he is confident it came from the urine.  Abdominal ultrasound 08/2020 bilateral renal cysts, no stones and no hydronephrosis.   Patient denies any modifying or aggravating factors.  Patient denies any dysuria or suprapubic/flank pain.  Patient denies any fevers, chills, nausea or vomiting.   UA negative for micro heme.  PVR 0 mL.     PMH: Past Medical History:  Diagnosis Date  . Atrial fibrillation ([place])   . Basal cell carcinoma   . Benign paroxysmal positional vertigo   . Cancer ([place])   . History of chicken pox   . History of measles   . History of mumps   . Hypertension   . Mononeuritis of unspecified site   . Other seborrheic keratosis   . Stroke ([place])   . 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 Traumatic subdural hematoma without loss of consciousness ([place]) 06/25/2020  . Unspecified hypothyroidism     Surgical History: Past Surgical History:  Procedure Laterality Date  . Carotid Doppler Ultrasound  04/02/2010   Small amount Calcified plaque bilaterally, no significant stenosis.  . COLONOSCOPY  05/11/2014   Tubular Adenoma. Dr. Allen Norris  . COLONOSCOPY WITH PROPOFOL N/A 05/23/2018   Procedure: COLONOSCOPY WITH  PROPOFOL;  Surgeon: [name], MD;  Location: [facility] ENDOSCOPY;  Service: Gastroenterology;  Laterality: N/A;  . DOPPLER ECHOCARDIOGRAPHY  04/02/2010   Mild left atrial dilation. Normal right atrium. No valvular disease. No thrombus. Normal LV function. EF>55%  . ESOPHAGOGASTRODUODENOSCOPY (EGD) WITH PROPOFOL N/A 08/16/2019   Procedure: ESOPHAGOGASTRODUODENOSCOPY (EGD) WITH PROPOFOL;  Surgeon: [name], MD;  Location: [facility] ENDOSCOPY;  Service: Gastroenterology;  Laterality: N/A;  . FINGER  AMPUTATION    . HERNIA REPAIR    . MRI Brain with and without contrast  03/20/2010   Suggestive of basal ganglia lacunar infarct  . PROSTATECTOMY  1995   Abdominal  . SKIN SURGERY     multiple  . Thumb surgery      Home Medications:  Allergies as of 12/03/2020      Reactions   Lisinopril Swelling   Tongue swelling Tongue swelling  Tongue swelling Tongue swelling  Tongue swelling    Myrbetriq [mirabegron] Swelling   Of the tongue   Other Other (See Comments)   Symptomatic bradycardia    Amlodipine Other (See Comments)   Peripheral edema   Atorvastatin Other (See Comments)   Dizziness and Fatigue Dizziness and Fatigue   Dabigatran Etexilate Mesylate Other (See Comments)   Stomach ulcers (Prad[place])   Dabigatran Etexilate Mesylate Other (See Comments)   Stomach ulcers (Pradaxa)      Medication List       Accurate as of December 03, 2020 11:59 PM. If you have any questions, ask your nurse or doctor.        aspirin 81 MG chewable tablet Chew by mouth daily.   Biotin 10 MG Tabs Take 8 mg by mouth daily.   calcium-vitamin D 250-125 MG-UNIT tablet Commonly known as: OSCAL Take 1 tablet by mouth 2 (two) times daily.   chlorthalidone 25 MG tablet Commonly known as: HYGROTON Take 1 tablet (25 mg total) by mouth daily. What changed: when to take this   cyclobenzaprine 10 MG tablet Commonly known as: FLEXERIL Take 0.5 tablets (5 mg total) by mouth 2 (two) times daily as needed.   diltiazem 30 MG tablet Commonly known as: CARDIZEM Take 1 tablet (30 mg total) by mouth once daily as needed (palpitations)   EPINEPHrine 0.3 mg/0.3 mL Soaj injection Commonly known as: EPI-PEN Inject 0.3 mg into the muscle once.   fesoterodine 4 MG Tb24 tablet Commonly known as: TOVIAZ Take 4 mg by mouth as needed.   flecainide 50 MG tablet Commonly known as: TAMBOCOR Take 1 tablet (50 mg total) by mouth 2 (two) times daily.   gabapentin 100 MG capsule Commonly known as:  NEURONTIN Take 1 capsule (100 mg total) by mouth 3 (three) times daily.   Leuprolide Acetate (6 Month) 45 MG injection Commonly known as: LUPRON Inject 45 mg into the muscle every 6 (six) months.   losartan 25 MG tablet Commonly known as: COZAAR Take 25 mg by mouth daily.   omeprazole 40 MG capsule Commonly known as: PRILOSEC TAKE ONE CAPSULE DAILY.   Potassium 99 MG Tabs Take by mouth.   traMADol 50 MG tablet Commonly known as: ULTRAM Take 1 tablet (50 mg total) by mouth every 6 (six) hours as needed.   Trospium Chloride 60 MG Cp24 Take 1 capsule (60 mg total) by mouth daily. Started by: Zara Council, PA-C   VITAMIN B-12 PO Take by mouth daily.   zolpidem 10 MG tablet Commonly known as: AMBIEN Take 0.5-1 tablets (5-10 mg total) by mouth at bedtime as  needed.       Allergies:  Allergies  Allergen Reactions  . Lisinopril Swelling    Tongue swelling Tongue swelling  Tongue swelling Tongue swelling  Tongue swelling   . Myrbetriq [Mirabegron] Swelling    Of the tongue  . Other Other (See Comments)    Symptomatic bradycardia   . Triamterene     Other reaction(s): ITCHING,WATERING EYES  . Amlodipine Other (See Comments)    Peripheral edema  . Atorvastatin Other (See Comments)    Dizziness and Fatigue  Dizziness and Fatigue  . Dabigatran Etexilate Mesylate Other (See Comments)    Stomach ulcers (Pradaxa)  . Dabigatran Etexilate Mesylate Other (See Comments)    Stomach ulcers (Pradaxa)    Family History: Family History  Problem Relation Age of Onset  . Heart disease Mother 58       CABG  . Hypertension Mother   . Heart attack Mother   . Hypertension Brother   . Heart disease Sister        stents  . Prostate cancer Brother   . Kidney cancer Brother   . Skin cancer Brother   . Hypertension Brother   . Bladder Cancer Sister   . Leukemia Father   . Stroke Father        multiple  . Cerebral aneurysm Sister   . Kidney cancer Brother     Social  History:  reports that he has quit smoking. His smoking use included cigarettes. He smoked 0.00 packs per day for 12.00 years. His smokeless tobacco use includes chew. He reports previous alcohol use of about 5.0 standard drinks of alcohol per week. He reports that he does not use drugs.  ROS: Pertinent ROS in HPI  Physical Exam: BP (!) 156/69   Pulse 73   Ht 5\' 10"  (1.778 m)   Wt 165 lb (74.8 kg)   BMI 23.68 kg/m   Constitutional:  Well nourished. Alert and oriented, No acute distress. HEENT: [place] AT, mask in place.  Trachea midline Cardiovascular: No clubbing, cyanosis, or edema. Respiratory: Normal respiratory effort, no increased work of breathing. GU: No CVA tenderness.  No bladder fullness or masses.  Patient with uncircumcised phallus.  Foreskin easily retracted Urethral meatus is patent.  No penile discharge. No penile lesions or rashes. Scrotum without lesions, cysts, rashes and/or edema.  Testicles are located scrotally bilaterally. No masses are appreciated in the testicles. Left and right epididymis are normal. Neurologic: Grossly intact, no focal deficits, moving all 4 extremities. Psychiatric: Normal mood and affect.  Laboratory Data: Lab Results  Component Value Date   WBC 1.9 (L) 09/16/2020   HGB 11.3 (L) 09/16/2020   HCT 35.1 (L) 09/16/2020   MCV 79.4 (L) 09/16/2020   PLT 47 (L) 09/16/2020    Lab Results  Component Value Date   CREATININE 0.79 09/12/2020   Lab Results  Component Value Date   HGBA1C 5.5 07/17/2020   Lab Results  Component Value Date   AST 26 09/12/2020   Lab Results  Component Value Date   ALT 16 09/12/2020   Urinalysis Component     Latest Ref Rng & Units 12/03/2020  Specific Gravity, UA     1.005 - 1.030 1.010  pH, UA     5.0 - 7.5 7.0  Color, UA     Yellow Yellow  Appearance Ur     Clear Clear  Leukocytes,UA     Negative Negative  Protein,UA     Negative/Trace Negative  Glucose, UA  Negative Negative  Ketones, UA      Negative Negative  RBC, UA     Negative Negative  Bilirubin, UA     Negative Negative  Urobilinogen, Ur     0.2 - 1.0 mg/dL 0.2  Nitrite, UA     Negative Negative  Microscopic Examination      See below:   Component     Latest Ref Rng & Units 12/03/2020  WBC, UA     0 - 5 /hpf 0-5  RBC     0 - 2 /hpf None seen  Epithelial Cells (non renal)     0 - 10 /hpf 0-10  Bacteria, UA     None seen/Few None seen   I have reviewed the labs.  Pertinent Imaging Results for KIERON, PARTIN (MRN [id number]) as of 12/03/2020 13:38  Ref. Range 12/03/2020 13:12  Scan Result Unknown 0   CLINICAL DATA:  Abdominal distension  EXAM: ABDOMEN ULTRASOUND COMPLETE  COMPARISON:  May 26, 2019  FINDINGS: Gallbladder: Within the gallbladder, there are multiple echogenic foci which move and shadow consistent with cholelithiasis. Largest gallstone measures 5 mm in length. No gallbladder wall thickening or pericholecystic fluid. No sonographic Murphy sign noted by sonographer.  Common bile duct: Diameter: 6 mm. No intrahepatic, common hepatic, or common bile duct dilatation.  Liver: No focal lesion identified. Liver echogenicity is inhomogeneous and overall increased. Portal vein is patent on color Doppler imaging with normal direction of blood flow towards the liver.  IVC: No abnormality visualized.  Pancreas: No pancreatic mass or inflammatory focus.  Spleen: Spleen measures 18.8 x 18.7 x 11.1 cm with a measured splenic volume of 1,952 cubic cm. No focal splenic lesion or perisplenic fluid evident.  Right Kidney: Length: 11.3 cm. Echogenicity within normal limits. No hydronephrosis visualized. There is a cyst in the mid right kidney measuring 1.4 x 1.3 x 1.2 cm. In the mid to lower pole region on the right, there is a cyst measuring 1.9 x 2.0 x 1.7 cm.  Left Kidney: Length: 11.3 cm. Echogenicity within normal limits. No hydronephrosis visualized. There is a cyst in the mid  left kidney measuring 2.5 x 2.4 x 2.3 cm. There are cysts more inferiorly measuring 1.4 x 1.3 x 1.1 cm 1.0 x 1.0 x 0.8 cm respectively.  Abdominal aorta: No aneurysm visualized.  Other findings: No demonstrable ascites.  IMPRESSION: 1. Cholelithiasis. No gallbladder wall thickening or pericholecystic fluid.  2. Splenomegaly again noted with measured splenic volume of 1,952 cubic cm. No focal splenic lesions.  3. Liver echogenicity overall increased and somewhat inhomogeneous, likely due to hepatic steatosis with underlying parenchymal liver disease. No focal liver lesions are demonstrable on this study.  4.  Simple cysts in each kidney.   Electronically Signed   By: Lowella Grip III M.D.   On: 08/26/2020 16:36 I have independently reviewed the films.  See HPI.   Assessment & Plan:    1. Prostate cancer - PSA undetectable - ADT given today - see separate note -RTC in 6 months for next injection  2. High risk hematuria - had several drops of blood in his underwear - Abdominal US in 08/2020 bilateral cysts - not wanting to repeat CT scan at this time - schedule repeat cystoscopy - explained procedure and what to expect afterwards - patient agrees and will return for cysto   3. Urge incontinence -We will switch from Toviaz 4 mg daily tropsium XL 60 mg daily -RTC in 6 months  for I PSS and PVR  Return for cysto for gross heme .  Zara Council, PA-C  [facility] [address] [place], [place] [address] [phone]

## 2020-12-03 ENCOUNTER — Other Ambulatory Visit: Payer: Self-pay

## 2020-12-03 ENCOUNTER — Ambulatory Visit (INDEPENDENT_AMBULATORY_CARE_PROVIDER_SITE_OTHER): Payer: PPO | Admitting: Urology

## 2020-12-03 ENCOUNTER — Encounter: Payer: Self-pay | Admitting: Urology

## 2020-12-03 VITALS — BP 156/69 | HR 73 | Ht 70.0 in | Wt 165.0 lb

## 2020-12-03 DIAGNOSIS — N3941 Urge incontinence: Secondary | ICD-10-CM | POA: Diagnosis not present

## 2020-12-03 DIAGNOSIS — C61 Malignant neoplasm of prostate: Secondary | ICD-10-CM

## 2020-12-03 DIAGNOSIS — R310 Gross hematuria: Secondary | ICD-10-CM | POA: Diagnosis not present

## 2020-12-03 DIAGNOSIS — R319 Hematuria, unspecified: Secondary | ICD-10-CM | POA: Diagnosis not present

## 2020-12-03 LAB — BLADDER SCAN AMB NON-IMAGING: Scan Result: 0

## 2020-12-03 MED ORDER — TROSPIUM CHLORIDE ER 60 MG PO CP24: 1.0000 | ORAL_CAPSULE | Freq: Every day | ORAL | 3 refills | Status: DC

## 2020-12-03 MED ORDER — LEUPROLIDE ACETATE (6 MONTH) 45 MG ~~LOC~~ KIT
45.0000 mg | PACK | Freq: Once | SUBCUTANEOUS | Status: AC
  Administered 2020-12-03: 45 mg via SUBCUTANEOUS

## 2020-12-03 NOTE — Progress Notes (Signed)
Eligard SubQ Injection   Due to Prostate Cancer patient is present today for a Eligard Injection.  Medication: Eligard 6 month Dose: 45mg   Location: left  Lot: [id number] Exp: 01/2022  Patient tolerated well, no complications were noted  Performed by: Gaspar Cola

## 2020-12-04 LAB — URINALYSIS, COMPLETE
Bilirubin, UA: NEGATIVE
Glucose, UA: NEGATIVE
Ketones, UA: NEGATIVE
Leukocytes,UA: NEGATIVE
Nitrite, UA: NEGATIVE
Protein,UA: NEGATIVE
RBC, UA: NEGATIVE
Specific Gravity, UA: 1.01 (ref 1.005–1.030)
Urobilinogen, Ur: 0.2 mg/dL (ref 0.2–1.0)
pH, UA: 7 (ref 5.0–7.5)

## 2020-12-04 LAB — MICROSCOPIC EXAMINATION
Bacteria, UA: NONE SEEN
RBC, Urine: NONE SEEN /hpf (ref 0–2)

## 2020-12-23 ENCOUNTER — Ambulatory Visit: Payer: PPO | Admitting: Gastroenterology

## 2020-12-23 ENCOUNTER — Other Ambulatory Visit: Payer: Self-pay

## 2020-12-23 ENCOUNTER — Encounter: Payer: Self-pay | Admitting: Gastroenterology

## 2020-12-23 VITALS — BP 132/70 | HR 74 | Temp 98.2°F | Ht 70.0 in | Wt 172.2 lb

## 2020-12-23 DIAGNOSIS — K7460 Unspecified cirrhosis of liver: Secondary | ICD-10-CM | POA: Diagnosis not present

## 2020-12-23 DIAGNOSIS — R9389 Abnormal findings on diagnostic imaging of other specified body structures: Secondary | ICD-10-CM | POA: Diagnosis not present

## 2020-12-23 DIAGNOSIS — K7290 Hepatic failure, unspecified without coma: Secondary | ICD-10-CM

## 2020-12-23 DIAGNOSIS — K7682 Hepatic encephalopathy: Secondary | ICD-10-CM

## 2020-12-23 DIAGNOSIS — R0989 Other specified symptoms and signs involving the circulatory and respiratory systems: Secondary | ICD-10-CM | POA: Insufficient documentation

## 2020-12-23 DIAGNOSIS — R0609 Other forms of dyspnea: Secondary | ICD-10-CM | POA: Insufficient documentation

## 2020-12-23 DIAGNOSIS — R12 Heartburn: Secondary | ICD-10-CM | POA: Insufficient documentation

## 2020-12-23 DIAGNOSIS — F1010 Alcohol abuse, uncomplicated: Secondary | ICD-10-CM | POA: Insufficient documentation

## 2020-12-23 NOTE — Progress Notes (Signed)
[name] MD, MRCP(U.K) [address]  [place], [place] [address]  Main: [phone]  Fax: [phone]   Primary Care Physician: Birdie Sons, MD  Primary Gastroenterologist:  Dr. [name]   Abnormal MRI and liver cirrhosis   HPI: Ian Sanders is a 80 y.o. 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   He has a history of prostate cancer.  He has served in [employer] and spent time at [place].  He has had a tattoo during his service.  He has also had a blood transfusion in 1995.  He absolutely denied any illegal drug use either intravenous or snorting of cocaine.  07/16/2019: EGD: Gastric polyp showed hyperplastic polyp with ulceration.  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  07/13/2019: Not immune to hepatitis A and B and need vaccination.  Low level of iron.  Negative hepatitis B and C.  ANA positive.  Negative smooth muscle antibody.  Ferritin of 25 and iron of 60 .  Celiac serology negative. Negative AMA and smooth muscle antibody.  Did not tolerate lactulose previously as it caused diarrhea  Interval history 08/24/2019-12/23/2020   08/26/2020:USG abdomen shows splenomegaly, hepatic steatosis , simple kidney cysts , no liver mass  09/16/2020: Hb 11.3, MCV 79, platelet count 47. Bone marrows shows no pathological abnormality as etiology for low platelet count.  Being evaluated for hematuria. Plan for cystoscopy  Score of 8 I have evaluated and October 2021  He has stopped drinking alcohol.  On lactulose which he takes daily.  Sometimes has issues with confusion but overall doing fine.  Has a daily bowel movement.  Denies any NSAID use.  Feels well otherwise.  During his CT bone marrow biopsy there was some amount of fluid noted in the left lower quadrant and some mild mesenteric edema.  Further evaluation with a CT scan was recommended and he was referred to  [place] to evaluate further.  He denies any GI symptoms.   Current Outpatient Medications  Medication Sig Dispense Refill  . aspirin 81 MG chewable tablet Chew by mouth daily.    Marland Kitchen atenolol (TENORMIN) 50 MG tablet Take 1 tablet by mouth daily.    . Biotin 10 MG TABS Take 8 mg by mouth daily.     . calcium-vitamin D (OSCAL) 250-125 MG-UNIT per tablet Take 1 tablet by mouth 2 (two) times daily.     . chlorthalidone (HYGROTON) 25 MG tablet Take 1 tablet (25 mg total) by mouth daily. (Patient taking differently: Take 25 mg by mouth every other day.) 90 tablet 3  . Cyanocobalamin (VITAMIN B-12 PO) Take by mouth daily.    . cyclobenzaprine (FLEXERIL) 10 MG tablet Take 0.5 tablets (5 mg total) by mouth 2 (two) times daily as needed. 30 tablet 2  . diltiazem (CARDIZEM) 30 MG tablet Take 1 tablet (30 mg total) by mouth once daily as needed (palpitations)    . EPINEPHrine 0.3 mg/0.3 mL IJ SOAJ injection Inject 0.3 mg into the muscle once.     . Ferrous Sulfate 134 MG TABS Take 1 tablet by mouth daily.    . fesoterodine (TOVIAZ) 4 MG TB24 tablet Take 4 mg by mouth as needed.    . flecainide (TAMBOCOR) 50 MG tablet Take 1 tablet (50 mg total) by mouth 2 (two) times daily. 180 tablet 3  . gabapentin (NEURONTIN) 100 MG capsule Take  1 capsule (100 mg total) by mouth 3 (three) times daily. 270 capsule 0  . Leuprolide Acetate, 6 Month, (LUPRON) 45 MG injection Inject 45 mg into the muscle every 6 (six) months.    Marland Kitchen losartan (COZAAR) 25 MG tablet Take 25 mg by mouth daily.     Marland Kitchen omeprazole (PRILOSEC) 40 MG capsule TAKE ONE CAPSULE DAILY. (Patient taking differently: Take 40 mg by mouth daily.) 90 capsule 4  . Potassium 99 MG TABS Take by mouth.    . traMADol (ULTRAM) 50 MG tablet Take 1 tablet (50 mg total) by mouth every 6 (six) hours as needed. 30 tablet 4  . Trospium Chloride 60 MG CP24 Take 1 capsule (60 mg total) by mouth daily. 30 capsule 3  . zolpidem (AMBIEN) 10 MG tablet Take 0.5-1 tablets (5-10 mg total)  by mouth at bedtime as needed. 30 tablet 5   Current Facility-Administered Medications  Medication Dose Route Frequency Provider Last Rate Last Admin  . lidocaine (XYLOCAINE) 2 % jelly 1 application  1 application Urethral Once Stoioff, Ronda Fairly, MD        Allergies as of 12/23/2020 - Review Complete 12/23/2020  Allergen Reaction Noted  . Lisinopril Swelling 02/12/2015  . Myrbetriq [mirabegron] Swelling 12/26/2015  . Other Other (See Comments) 08/20/2020  . Triamterene  08/27/2004  . Amlodipine Other (See Comments) 06/11/2020  . Atorvastatin Other (See Comments) 06/19/2015  . Dabigatran etexilate mesylate Other (See Comments) 12/14/2011  . Dabigatran etexilate mesylate Other (See Comments) 12/14/2011    ROS:  General: Negative for anorexia, weight loss, fever, chills, fatigue, weakness. ENT: Negative for hoarseness, difficulty swallowing , nasal congestion. CV: Negative for chest pain, angina, palpitations, dyspnea on exertion, peripheral edema.  Respiratory: Negative for dyspnea at rest, dyspnea on exertion, cough, sputum, wheezing.  GI: See history of present illness. GU:  Negative for dysuria, hematuria, urinary incontinence, urinary frequency, nocturnal urination.  Endo: Negative for unusual weight change.    Physical Examination:   BP 132/70   Pulse 74   Temp 98.2 F (36.8 C) (Oral)   Ht _0  (1.778 m)   Wt 172 lb 3.2 oz (78.1 kg)   BMI 24.71 kg/m   General: Well-nourished, well-developed in no acute distress.  Eyes: No icterus. Conjunctivae pink. Mouth: Oropharyngeal mucosa moist and pink , no lesions erythema or exudate. Lungs: Clear to auscultation bilaterally. Non-labored. Heart: Regular rate and rhythm, no murmurs rubs or gallops.  Abdomen: Bowel sounds are normal, nontender, nondistended, no hepatosplenomegaly or masses, no abdominal bruits or hernia , no rebound or guarding.   Extremities: No lower extremity edema. No clubbing or deformities. Neuro: Alert  and oriented x 3.  Grossly intact. Skin: Warm and dry, no jaundice.   Psych: Alert and cooperative, normal mood and affect.   Imaging Studies: No results found.  Assessment and Plan:   Ian Sanders is a 80 y.o. 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  Compensated.    No significant hepatic encephalopathy.  He was lost to follow-up and has returned back to continue care for his liver cirrhosis as well as evaluate abnormality seen on recent CT scan which was performed for bone marrow biopsy.  The findings are nonspecific and a CT scan of the abdomen and pelvis has been recommended.  Plan 1.   CT abdomen and pelvis to evaluate abnormality seen on recent imaging. 2. Right upper quadrant ultrasound every 6 monthly to screen for  [place].  Due in April 2022 3. EGD to screen for esophageal varices in 2023.Marland Kitchen 4.Check labs to calculate meld score at next visit 5.  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  At his next visit if he continues to have symptoms then can consider putting him on Xifaxan.   Dr [name]  MD,MRCP [facility]) Follow up in 2 to 3 months

## 2020-12-23 NOTE — Patient Instructions (Addendum)
CT scan of the Abdomen has been schedule for April 13th at 9:00am. Please arrive at the [facility] entrance to check in by 8:30am with Radiology. You will need to pick up the prep at Radiology between now and April. Nothing to eat or drink 4 hours before CT scan.

## 2020-12-25 ENCOUNTER — Telehealth: Payer: Self-pay | Admitting: *Deleted

## 2020-12-25 NOTE — Telephone Encounter (Signed)
-----   Message from Rochele Raring sent at 12/25/2020 12:56 PM EST ----- Regarding: Pt requests to cancel appts Hey, pt left a vm stating that he would like to cancel his future appts with Dr. B because he is now seeing "Dr. Wilhemena Durie" as Dr. B requested. Please advise.   Thanks, [employer]

## 2020-12-31 ENCOUNTER — Ambulatory Visit: Payer: PPO | Admitting: Internal Medicine

## 2020-12-31 ENCOUNTER — Other Ambulatory Visit: Payer: PPO

## 2021-01-01 ENCOUNTER — Other Ambulatory Visit: Payer: Self-pay | Admitting: Family Medicine

## 2021-01-01 DIAGNOSIS — G629 Polyneuropathy, unspecified: Secondary | ICD-10-CM

## 2021-01-01 NOTE — Telephone Encounter (Signed)
Requested medication (s) are due for refill today: yes  Requested medication (s) are on the active medication list: no  Last refill:  09/30/20  Future visit scheduled: no  Notes to clinic: not on active med list   Requested Prescriptions  Pending Prescriptions Disp Refills   gabapentin (NEURONTIN) 100 MG capsule [Pharmacy Med Name: GABAPENTIN 100 MG CAPSULE] 270 capsule 0    Sig: Take 1 capsule (100 mg total) by mouth 3 (three) times daily.      Neurology: Anticonvulsants - gabapentin Passed - 01/01/2021 11:24 AM      Passed - Valid encounter within last 12 months    Recent Outpatient Visits           4 months ago Annual physical exam   [facility] Birdie Sons, MD   5 months ago Traumatic subdural hematoma without loss of consciousness, initial encounter [facility])   [facility] Birdie Sons, MD   1 year ago Essential hypertension   [facility] Birdie Sons, MD   2 years ago 33 of both feet   [facility] Birdie Sons, MD   2 years ago Neuropathy   [facility] Birdie Sons, MD       Future Appointments             In 3 months [name], MD [place]

## 2021-01-01 NOTE — Telephone Encounter (Signed)
Requested medication (s) are due for refill today: yes  Requested medication (s) are on the active medication list: yes  Last refill:  10/30/20  Future visit scheduled: no  Notes to clinic:  not delegated   Requested Prescriptions  Pending Prescriptions Disp Refills   zolpidem (AMBIEN) 10 MG tablet [Pharmacy Med Name: ZOLPIDEM TARTRATE 10 MG TABLET] 30 tablet 0    Sig: TAKE ONE-HALF TO ONE TABLET AT BEDTIME.      Not Delegated - Psychiatry:  Anxiolytics/Hypnotics Failed - 01/01/2021 11:27 AM      Failed - This refill cannot be delegated      Failed - Urine Drug Screen completed in last 360 days      Passed - Valid encounter within last 6 months    Recent Outpatient Visits           4 months ago Annual physical exam   [facility] Birdie Sons, MD   5 months ago Traumatic subdural hematoma without loss of consciousness, initial encounter [facility])   [facility] Birdie Sons, MD   1 year ago Essential hypertension   [facility] Birdie Sons, MD   2 years ago 35 of both feet   [facility] Birdie Sons, MD   2 years ago Neuropathy   [facility] Birdie Sons, MD       Future Appointments             In 3 months [name], MD [place]

## 2021-01-03 ENCOUNTER — Other Ambulatory Visit: Payer: Self-pay

## 2021-01-03 ENCOUNTER — Ambulatory Visit: Payer: PPO | Admitting: Urology

## 2021-01-03 ENCOUNTER — Encounter: Payer: Self-pay | Admitting: Urology

## 2021-01-03 VITALS — BP 161/73 | HR 72 | Ht 68.0 in | Wt 168.0 lb

## 2021-01-03 DIAGNOSIS — N368 Other specified disorders of urethra: Secondary | ICD-10-CM

## 2021-01-03 DIAGNOSIS — R310 Gross hematuria: Secondary | ICD-10-CM | POA: Diagnosis not present

## 2021-01-03 LAB — URINALYSIS, COMPLETE
Bilirubin, UA: NEGATIVE
Glucose, UA: NEGATIVE
Ketones, UA: NEGATIVE
Leukocytes,UA: NEGATIVE
Nitrite, UA: NEGATIVE
Protein,UA: NEGATIVE
RBC, UA: NEGATIVE
Specific Gravity, UA: 1.015 (ref 1.005–1.030)
Urobilinogen, Ur: 0.2 mg/dL (ref 0.2–1.0)
pH, UA: 7 (ref 5.0–7.5)

## 2021-01-03 LAB — MICROSCOPIC EXAMINATION: Bacteria, UA: NONE SEEN

## 2021-01-03 NOTE — Progress Notes (Signed)
   01/03/21  CC:  Chief Complaint  Patient presents with  . Cysto    HPI:   Refer to Shannon's note 12/03/2020  Noted a small blood spotting in his underwear approximately 2 weeks ago but no recurrences since that time  Denies gross hematuria  Was switched from Luke to trospium for incontinence and states he went back to [place] because he thought this worked better  Blood pressure (!) 161/73, pulse 72, height 5\' 8"  (1.727 m), weight 168 lb (76.2 kg). NED. A&Ox3.   No respiratory distress   Abd soft, NT, ND Normal phallus with bilateral descended testicles  Cystoscopy Procedure Note  Patient identification was confirmed, informed consent was obtained, and patient was prepped using Betadine solution.  Lidocaine jelly was administered per urethral meatus.     Pre-Procedure: - Inspection reveals a normal caliber urethral meatus.  Procedure: The flexible cystoscope was introduced without difficulty - No urethral strictures/lesions are present.  Prominent hypervascularity of urethra - Surgically absent prostate  - Normal bladder neck; no bladder neck contracture - Bilateral ureteral orifices identified - Bladder mucosa  reveals no ulcers, tumors, or lesions - No bladder stones - No trabeculation  Retroflexion shows no abnormalities   Post-Procedure: - Patient tolerated the procedure well  Assessment/ Plan:  No significant abnormalities on cystoscopy  Prominent hypervascularity of urethra which is the most likely etiology of his prior urethral bleeding  Keep scheduled follow-up with Wilhemena Durie, MD

## 2021-01-08 DIAGNOSIS — E559 Vitamin D deficiency, unspecified: Secondary | ICD-10-CM | POA: Diagnosis not present

## 2021-01-08 DIAGNOSIS — E538 Deficiency of other specified B group vitamins: Secondary | ICD-10-CM | POA: Diagnosis not present

## 2021-01-08 DIAGNOSIS — G20 Parkinson's disease: Secondary | ICD-10-CM | POA: Diagnosis not present

## 2021-01-08 DIAGNOSIS — R42 Dizziness and giddiness: Secondary | ICD-10-CM | POA: Diagnosis not present

## 2021-01-08 DIAGNOSIS — Z8679 Personal history of other diseases of the circulatory system: Secondary | ICD-10-CM | POA: Diagnosis not present

## 2021-01-08 DIAGNOSIS — Z8673 Personal history of transient ischemic attack (TIA), and cerebral infarction without residual deficits: Secondary | ICD-10-CM | POA: Diagnosis not present

## 2021-01-13 ENCOUNTER — Other Ambulatory Visit: Payer: Self-pay | Admitting: Neurology

## 2021-01-13 DIAGNOSIS — R42 Dizziness and giddiness: Secondary | ICD-10-CM

## 2021-01-13 DIAGNOSIS — Z8679 Personal history of other diseases of the circulatory system: Secondary | ICD-10-CM

## 2021-01-13 DIAGNOSIS — Z8673 Personal history of transient ischemic attack (TIA), and cerebral infarction without residual deficits: Secondary | ICD-10-CM

## 2021-01-28 ENCOUNTER — Other Ambulatory Visit: Payer: Self-pay

## 2021-01-28 ENCOUNTER — Ambulatory Visit
Admission: RE | Admit: 2021-01-28 | Discharge: 2021-01-28 | Disposition: A | Discharge status: Home / Self Care | Payer: PPO | Source: Ambulatory Visit | Attending: Neurology | Admitting: Neurology

## 2021-01-28 DIAGNOSIS — Z8679 Personal history of other diseases of the circulatory system: Secondary | ICD-10-CM | POA: Diagnosis not present

## 2021-01-28 DIAGNOSIS — Z8673 Personal history of transient ischemic attack (TIA), and cerebral infarction without residual deficits: Secondary | ICD-10-CM | POA: Diagnosis not present

## 2021-01-28 DIAGNOSIS — R42 Dizziness and giddiness: Secondary | ICD-10-CM | POA: Insufficient documentation

## 2021-01-29 DIAGNOSIS — N390 Urinary tract infection, site not specified: Secondary | ICD-10-CM | POA: Diagnosis not present

## 2021-02-06 DIAGNOSIS — Z87440 Personal history of urinary (tract) infections: Secondary | ICD-10-CM | POA: Diagnosis not present

## 2021-02-26 ENCOUNTER — Ambulatory Visit
Admission: RE | Admit: 2021-02-26 | Discharge: 2021-02-26 | Disposition: A | Discharge status: Home / Self Care | Payer: PPO | Source: Ambulatory Visit | Attending: Gastroenterology | Admitting: Gastroenterology

## 2021-02-26 ENCOUNTER — Other Ambulatory Visit: Payer: Self-pay

## 2021-02-26 DIAGNOSIS — K7682 Hepatic encephalopathy: Secondary | ICD-10-CM

## 2021-02-26 DIAGNOSIS — Z8669 Personal history of other diseases of the nervous system and sense organs: Secondary | ICD-10-CM | POA: Diagnosis not present

## 2021-02-26 DIAGNOSIS — K7469 Other cirrhosis of liver: Secondary | ICD-10-CM | POA: Diagnosis not present

## 2021-02-26 DIAGNOSIS — D7389 Other diseases of spleen: Secondary | ICD-10-CM | POA: Diagnosis not present

## 2021-02-26 DIAGNOSIS — K7290 Hepatic failure, unspecified without coma: Secondary | ICD-10-CM | POA: Diagnosis not present

## 2021-02-26 DIAGNOSIS — K8020 Calculus of gallbladder without cholecystitis without obstruction: Secondary | ICD-10-CM | POA: Diagnosis not present

## 2021-02-26 LAB — POCT I-STAT CREATININE: Creatinine, Ser: 0.9 mg/dL (ref 0.61–1.24)

## 2021-02-26 MED ORDER — IOHEXOL 300 MG/ML  SOLN
100.0000 mL | Freq: Once | INTRAMUSCULAR | Status: AC | PRN
  Administered 2021-02-26: 100 mL via INTRAVENOUS

## 2021-03-07 ENCOUNTER — Other Ambulatory Visit: Payer: Self-pay | Admitting: Family Medicine

## 2021-03-07 NOTE — Telephone Encounter (Signed)
Requested medication (s) are due for refill today:   Provider to review  Requested medication (s) are on the active medication list:   Yes  Future visit scheduled:   No   Last ordered: 08/06/2020 #30, 4 refills  Non delegated refill   Requested Prescriptions  Pending Prescriptions Disp Refills   traMADol (ULTRAM) 50 MG tablet [Pharmacy Med Name: TRAMADOL HCL 50 MG TABLET] 30 tablet 0    Sig: Take 1 tablet (50 mg total) by mouth every 6 (six) hours as needed.      Not Delegated - Analgesics:  Opioid Agonists Failed - 03/07/2021  9:33 AM      Failed - This refill cannot be delegated      Failed - Urine Drug Screen completed in last 360 days      Failed - Valid encounter within last 6 months    Recent Outpatient Visits           6 months ago Annual physical exam   [facility] Birdie Sons, MD   7 months ago Traumatic subdural hematoma without loss of consciousness, initial encounter [facility])   [facility] Birdie Sons, MD   1 year ago Essential hypertension   [facility] Birdie Sons, MD   2 years ago 96 of both feet   [facility] Birdie Sons, MD   2 years ago Neuropathy   [facility] Birdie Sons, MD       Future Appointments             In 3 weeks [name], MD [place]   In 3 months McGowan, Shannon A, [place]

## 2021-03-11 ENCOUNTER — Telehealth: Payer: Self-pay

## 2021-03-11 NOTE — Telephone Encounter (Signed)
Patient was informed of CT results. Pt will call PCP for a follow up visit. Pt verbalized understanding.

## 2021-03-11 NOTE — Telephone Encounter (Signed)
-----   Message from [name], MD sent at 03/11/2021  9:20 AM EDT ----- Ian Sanders   Inform   1. 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 Should follow up with Birdie Sons, MD for B/l femoral head avascular necrosis , gynecomastic    C/c Fisher, Kirstie Peri, MD   Dr [name] MD,MRCP [facility]) Gastroenterology/Hepatology Pager: [phone]

## 2021-03-12 ENCOUNTER — Encounter: Payer: Self-pay | Admitting: Family Medicine

## 2021-03-12 DIAGNOSIS — M87059 Idiopathic aseptic necrosis of unspecified femur: Secondary | ICD-10-CM | POA: Insufficient documentation

## 2021-03-17 ENCOUNTER — Other Ambulatory Visit: Payer: Self-pay

## 2021-03-17 ENCOUNTER — Ambulatory Visit (INDEPENDENT_AMBULATORY_CARE_PROVIDER_SITE_OTHER): Payer: PPO | Admitting: Family Medicine

## 2021-03-17 ENCOUNTER — Encounter: Payer: Self-pay | Admitting: Family Medicine

## 2021-03-17 VITALS — BP 146/72 | HR 64 | Wt 171.2 lb

## 2021-03-17 DIAGNOSIS — D61818 Other pancytopenia: Secondary | ICD-10-CM | POA: Diagnosis not present

## 2021-03-17 DIAGNOSIS — R161 Splenomegaly, not elsewhere classified: Secondary | ICD-10-CM | POA: Diagnosis not present

## 2021-03-17 DIAGNOSIS — R7303 Prediabetes: Secondary | ICD-10-CM

## 2021-03-17 DIAGNOSIS — M87059 Idiopathic aseptic necrosis of unspecified femur: Secondary | ICD-10-CM

## 2021-03-17 LAB — POCT GLYCOSYLATED HEMOGLOBIN (HGB A1C)
Est. average glucose Bld gHb Est-mCnc: 105
Hemoglobin A1C: 5.3 % (ref 4.0–5.6)

## 2021-03-17 NOTE — Progress Notes (Signed)
Established patient visit   Patient: Ian Sanders   DOB: Oct 11, 1941   80 y.o. Male  MRN: [id number] Visit Date: 03/17/2021  Today's healthcare provider: Lelon Huh, MD   Chief Complaint  Patient presents with  . Diabetes   I,Porsha C McClurkin,acting as a scribe for Lelon Huh, MD.,have documented all relevant documentation on the behalf of Lelon Huh, MD,as directed by  Lelon Huh, MD while in the presence of Lelon Huh, MD.  Subjective    HPI  Diabetes Mellitus Type II, Follow-up  Lab Results  Component Value Date   HGBA1C 5.5 07/17/2020   HGBA1C 6.7 (H) 05/16/2019   HGBA1C 6.3 (A) 10/04/2018   Wt Readings from Last 3 Encounters:  03/17/21 171 lb 3.2 oz (77.7 kg)  01/03/21 168 lb (76.2 kg)  12/23/20 172 lb 3.2 oz (78.1 kg)   Last seen for diabetes 8 months ago.  Management since then includes continuing healthy diet (diet controlled). He reports good compliance with treatment. He is not having side effects.  Symptoms: No fatigue No foot ulcerations  No appetite changes No nausea  No paresthesia of the feet  No polydipsia  No polyuria No visual disturbances   No vomiting     Home blood sugar records: not being checked  Episodes of hypoglycemia? No    Current insulin regiment: none Most Recent Eye Exam:  Current exercise: housecleaning and no regular exercise Current diet habits: well balanced  Pertinent Labs: Lab Results  Component Value Date   CHOL 113 05/16/2019   HDL 26 (L) 05/16/2019   LDLCALC 41 05/16/2019   TRIG 231 (H) 05/16/2019   CHOLHDL 4.3 05/16/2019   Lab Results  Component Value Date   NA 136 09/12/2020   K 3.1 (L) 09/12/2020   CREATININE 0.90 02/26/2021   GFRNONAA >60 09/12/2020   GFRAA 98 08/19/2020   GLUCOSE 161 (H) 09/12/2020     --------------------------------------------------------------------------------------------------- He also wishes to discuss results of recent CT ordered by Dr. Vicente Males who follows  patient for liver disease. CT showed enlarged but stable spleen, liver cirrhosis, and AVN of both hips. He states he is not having any unusual pains in his hips and no more trouble ambulating than usual.      Medications: Outpatient Medications Prior to Visit  Medication Sig  . aspirin 81 MG chewable tablet Chew by mouth daily.  . Biotin 10 MG TABS Take 8 mg by mouth daily.   . calcium-vitamin D (OSCAL) 250-125 MG-UNIT per tablet Take 1 tablet by mouth 2 (two) times daily.   . chlorthalidone (HYGROTON) 25 MG tablet Take 1 tablet (25 mg total) by mouth daily. (Patient taking differently: Take 25 mg by mouth every other day.)  . Cyanocobalamin (VITAMIN B-12 PO) Take by mouth daily.  . cyclobenzaprine (FLEXERIL) 10 MG tablet Take 0.5 tablets (5 mg total) by mouth 2 (two) times daily as needed.  . diltiazem (CARDIZEM) 30 MG tablet Take 1 tablet (30 mg total) by mouth once daily as needed (palpitations)  . Ferrous Sulfate 134 MG TABS Take 1 tablet by mouth daily.  . fesoterodine (TOVIAZ) 4 MG TB24 tablet Take 4 mg by mouth as needed.  . flecainide (TAMBOCOR) 50 MG tablet Take 1 tablet (50 mg total) by mouth 2 (two) times daily.  Marland Kitchen gabapentin (NEURONTIN) 100 MG capsule Take 1 capsule (100 mg total) by mouth 3 (three) times daily.  Marland Kitchen Leuprolide Acetate, 6 Month, (LUPRON) 45 MG injection Inject 45 mg into  the muscle every 6 (six) months.  Marland Kitchen losartan (COZAAR) 25 MG tablet Take 50 mg by mouth daily.  Marland Kitchen omeprazole (PRILOSEC) 40 MG capsule TAKE ONE CAPSULE DAILY. (Patient taking differently: Take 40 mg by mouth daily.)  . Potassium 99 MG TABS Take by mouth.  . tolterodine (DETROL LA) 4 MG 24 hr capsule Take 4 mg by mouth daily.  . traMADol (ULTRAM) 50 MG tablet Take 1 tablet (50 mg total) by mouth every 6 (six) hours as needed.  . Trospium Chloride 60 MG CP24 Take 1 capsule (60 mg total) by mouth daily.  Marland Kitchen zolpidem (AMBIEN) 10 MG tablet Take 0.5-1 tablets (5-10 mg total) by mouth at bedtime as needed  for sleep.  . [DISCONTINUED] atenolol (TENORMIN) 50 MG tablet Take 1 tablet by mouth daily. (Patient not taking: Reported on 03/17/2021)  . [DISCONTINUED] EPINEPHrine 0.3 mg/0.3 mL IJ SOAJ injection Inject 0.3 mg into the muscle once.  (Patient not taking: Reported on 03/17/2021)  . [DISCONTINUED] lidocaine (XYLOCAINE) 2 % jelly 1 application    No facility-administered medications prior to visit.    Review of Systems  Constitutional: Negative.   Respiratory: Negative.   Cardiovascular: Negative.   Hematological: Negative.        Objective    BP (!) 146/72 (BP Location: Left Arm, Patient Position: Sitting, Cuff Size: Normal)   Pulse 64   Wt 171 lb 3.2 oz (77.7 kg)   SpO2 100%   BMI 26.03 kg/m     Physical Exam    General: Appearance:     Well developed, well nourished male in no acute distress  Eyes:    PERRL, conjunctiva/corneas clear, EOM's intact       Lungs:     Clear to auscultation bilaterally, respirations unlabored  Heart:    Normal heart rate. Irregularly irregular rhythm.  2/6 systolic murmur  Neurologic:   Awake, alert, oriented x 3. No apparent focal neurological           defect.        Results for orders placed or performed in visit on 03/17/21  POCT glycosylated hemoglobin (Hb A1C)  Result Value Ref Range   Hemoglobin A1C 5.3 4.0 - 5.6 %   Est. average glucose Bld gHb Est-mCnc 105      Assessment & Plan     1. Prediabetes Normal a1c today. 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   2. Avascular necrosis of bone of hip, unspecified laterality ([place] ) He reports he is not having any unusual pains in hips or difficulty ambulating. He is inclined to hold off on any orthopedic evaluation for the time being.   3. 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 Vicente Males. Complicated by pancytopenia followed by Dr. Rogue Bussing. Extensively counseled regarding recent CT findings. Will follow up in 6 months.         The entirety of the  information documented in the History of Present Illness, Review of Systems and Physical Exam were personally obtained by me. Portions of this information were initially documented by the CMA and reviewed by me for thoroughness and accuracy.      Lelon Huh, MD  [facility] [phone] (phone) [phone] (fax)  [place]

## 2021-03-17 NOTE — Patient Instructions (Signed)
.   Your spleen measures 17.8 cm on your recent CT scan. The normal range is 10-14 cm. It is larger than normal due to having chronic liver disease which Ian Sanders is monitoring.

## 2021-03-24 DIAGNOSIS — H2511 Age-related nuclear cataract, right eye: Secondary | ICD-10-CM | POA: Diagnosis not present

## 2021-03-27 DIAGNOSIS — H2511 Age-related nuclear cataract, right eye: Secondary | ICD-10-CM | POA: Diagnosis not present

## 2021-04-03 ENCOUNTER — Encounter: Payer: Self-pay | Admitting: Gastroenterology

## 2021-04-03 ENCOUNTER — Other Ambulatory Visit: Payer: Self-pay

## 2021-04-03 ENCOUNTER — Ambulatory Visit: Payer: PPO | Admitting: Gastroenterology

## 2021-04-03 VITALS — BP 149/65 | HR 62 | Ht 70.0 in | Wt 167.0 lb

## 2021-04-03 DIAGNOSIS — K7460 Unspecified cirrhosis of liver: Secondary | ICD-10-CM | POA: Diagnosis not present

## 2021-04-03 DIAGNOSIS — K7290 Hepatic failure, unspecified without coma: Secondary | ICD-10-CM

## 2021-04-03 DIAGNOSIS — K7682 Hepatic encephalopathy: Secondary | ICD-10-CM

## 2021-04-03 NOTE — Progress Notes (Signed)
Ian Bellows MD, MRCP(U.K) [address]  [place]  [place], [place] [address]  Main: [phone]  Fax: [phone]   Primary Care Physician: Ian Sons, MD  Primary Gastroenterologist:  Dr. Jonathon Sanders   Liver cirrhosis   HPI: Ian Sanders is a 80 y.o. 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  He has a history of prostate cancer. He has served in [employer] and spent time at [place]. He has had a tattoo during his service. He has also had a blood transfusion in 1995. He absolutely denied any illegal drug use either intravenous or snorting of cocaine.  Iron deficiency anemia secondary to possible hematuria  07/16/2019: EGD: no esophageal varices.  07/13/2019: Not immune to hepatitis A and B and need vaccination. Low level of iron. Negative hepatitisB and C. ANA positive. Negative smooth muscle antibody. Ferritin of 25 and iron of 60 . Celiac serology negative.Negative AMA and smooth muscle antibody.  08/26/2020:USG abdomen shows splenomegaly,  no liver mass  09/16/2020: Hb 11.3, MCV 79, platelet count 47. Bone marrows shows no pathological abnormality as etiology for low platelet count.    Did not tolerate lactulose previously as it caused diarrhea  Interval history 12/23/2020-04/03/2021 02/27/2021: CT abdomen and pelvis demonstrated cirrhosis with portal venous hypertension and splenomegaly no HCC.  Gastric underdistention cannot exclude gastric wall thickening gastritis.  Small volume pelvic fluid.  Left-sided gynecomastia.  Prostatectomy without metastatic disease.  Bilateral femoral head avascular necrosis.  No alcohol intake.  He has not been taking any lactulose recently and said he did not know how to take the lactulose.  He complains of difficulty sleeping at night and occasional confusion.  No abdominal pain.  Does not have a bowel movement every day.   Current  Outpatient Medications  Medication Sig Dispense Refill  . Biotin 10 MG TABS Take 8 mg by mouth daily.     . calcium-vitamin D (OSCAL) 250-125 MG-UNIT per tablet Take 1 tablet by mouth 2 (two) times daily.     . chlorthalidone (HYGROTON) 25 MG tablet Take 1 tablet (25 mg total) by mouth daily. (Patient taking differently: Take 25 mg by mouth every other day.) 90 tablet 3  . Cyanocobalamin (VITAMIN B-12 PO) Take by mouth daily.    . cyclobenzaprine (FLEXERIL) 10 MG tablet Take 0.5 tablets (5 mg total) by mouth 2 (two) times daily as needed. 30 tablet 2  . diltiazem (CARDIZEM) 30 MG tablet Take 1 tablet (30 mg total) by mouth once daily as needed (palpitations)    . Ferrous Sulfate 134 MG TABS Take 1 tablet by mouth daily.    . flecainide (TAMBOCOR) 50 MG tablet Take 1 tablet (50 mg total) by mouth 2 (two) times daily. 180 tablet 3  . gabapentin (NEURONTIN) 100 MG capsule Take 1 capsule (100 mg total) by mouth 3 (three) times daily. 270 capsule 0  . Leuprolide Acetate, 6 Month, (LUPRON) 45 MG injection Inject 45 mg into the muscle every 6 (six) months.    Marland Kitchen losartan (COZAAR) 25 MG tablet Take 50 mg by mouth daily.    Marland Kitchen omeprazole (PRILOSEC) 40 MG capsule TAKE ONE CAPSULE DAILY. (Patient taking differently: Take 40 mg by mouth daily.) 90 capsule 4  . Potassium 99 MG TABS Take by mouth.    . tolterodine (DETROL LA) 4 MG 24 hr capsule Take 4 mg by mouth daily.    Marland Kitchen  traMADol (ULTRAM) 50 MG tablet Take 1 tablet (50 mg total) by mouth every 6 (six) hours as needed. 30 tablet 3  . Trospium Chloride 60 MG CP24 Take 1 capsule (60 mg total) by mouth daily. 30 capsule 3  . zolpidem (AMBIEN) 10 MG tablet Take 0.5-1 tablets (5-10 mg total) by mouth at bedtime as needed for sleep. 30 tablet 2  . aspirin 81 MG chewable tablet Chew by mouth daily. (Patient not taking: Reported on 04/03/2021)    . fesoterodine (TOVIAZ) 4 MG TB24 tablet Take 4 mg by mouth as needed. (Patient not taking: Reported on 04/03/2021)     No  current facility-administered medications for this visit.    Allergies as of 04/03/2021 - Review Complete 04/03/2021  Allergen Reaction Noted  . Lisinopril Swelling 02/12/2015  . Myrbetriq [mirabegron] Swelling 12/26/2015  . Other Other (See Comments) 08/20/2020  . Triamterene  08/27/2004  . Amlodipine Other (See Comments) 06/11/2020  . Atorvastatin Other (See Comments) 06/19/2015  . Dabigatran etexilate mesylate Other (See Comments) 12/14/2011  . Dabigatran etexilate mesylate Other (See Comments) 12/14/2011    ROS:  General: Negative for anorexia, weight loss, fever, chills, fatigue, weakness. ENT: Negative for hoarseness, difficulty swallowing , nasal congestion. CV: Negative for chest pain, angina, palpitations, dyspnea on exertion, peripheral edema.  Respiratory: Negative for dyspnea at rest, dyspnea on exertion, cough, sputum, wheezing.  GI: See history of present illness. GU:  Negative for dysuria, hematuria, urinary incontinence, urinary frequency, nocturnal urination.  Endo: Negative for unusual weight change.    Physical Examination:   BP (!) 149/65 (BP Location: Left Arm, Patient Position: Sitting, Cuff Size: Large)   Pulse 62   Ht 5\' 10"  (1.778 m)   Wt 167 lb (75.8 kg)   BMI 23.96 kg/m   General: Well-nourished, well-developed in no acute distress.  Eyes: No icterus. Conjunctivae pink. Mouth: Oropharyngeal mucosa moist and pink , no lesions erythema or exudate. Lungs: Clear to auscultation bilaterally. Non-labored. Heart: Regular rate and rhythm, no murmurs rubs or gallops.  Abdomen: Bowel sounds are normal, nontender, nondistended, no hepatosplenomegaly or masses, no abdominal bruits or hernia , no rebound or guarding.   Extremities: No lower extremity edema. No clubbing or deformities. Neuro: Alert and oriented x 3.  Grossly intact. Skin: Warm and dry, no jaundice.   Psych: Alert and cooperative, normal mood and affect.   Imaging Studies: No results  found.  Assessment and Plan:   Ian Sanders is a 80 y.o. y/o male here to follow-up forcirrhosis of the liverlikely secondary to alcohol use. Compensated.   No significant hepatic encephalopathy although he may have mild hepatic encephalopathy.    Plan 1. Right upper quadrant ultrasound to screen for [facility] in October 2022 2. EGD to screen for esophageal varices in 2023 4.Check labs to calculate meld score  5.  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  If he does not respond to the lactulose in terms of improvement of his mental status despite having adequate bowel movements then we may need to add Xifaxan as well.  I have advised him to give me a call in a few weeks and inform me how he feels.    Dr Ian Bellows  MD,MRCP [facility]) Follow up in 6 months

## 2021-04-04 ENCOUNTER — Encounter: Payer: Self-pay | Admitting: Ophthalmology

## 2021-04-10 NOTE — Discharge Instructions (Signed)

## 2021-04-15 ENCOUNTER — Encounter
Admission: RE | Disposition: A | Discharge status: Home / Self Care | Payer: Self-pay | Source: Home / Self Care | Attending: Ophthalmology

## 2021-04-15 ENCOUNTER — Ambulatory Visit
Admission: RE | Admit: 2021-04-15 | Discharge: 2021-04-15 | Disposition: A | Discharge status: Home / Self Care | Payer: PPO | Attending: Ophthalmology | Admitting: Ophthalmology

## 2021-04-15 ENCOUNTER — Other Ambulatory Visit: Payer: Self-pay

## 2021-04-15 ENCOUNTER — Encounter: Payer: Self-pay | Admitting: Ophthalmology

## 2021-04-15 ENCOUNTER — Ambulatory Visit: Payer: PPO | Admitting: Anesthesiology

## 2021-04-15 DIAGNOSIS — Z8051 Family history of malignant neoplasm of kidney: Secondary | ICD-10-CM | POA: Diagnosis not present

## 2021-04-15 DIAGNOSIS — Z8249 Family history of ischemic heart disease and other diseases of the circulatory system: Secondary | ICD-10-CM | POA: Insufficient documentation

## 2021-04-15 DIAGNOSIS — E039 Hypothyroidism, unspecified: Secondary | ICD-10-CM | POA: Diagnosis not present

## 2021-04-15 DIAGNOSIS — Z8042 Family history of malignant neoplasm of prostate: Secondary | ICD-10-CM | POA: Insufficient documentation

## 2021-04-15 DIAGNOSIS — H25811 Combined forms of age-related cataract, right eye: Secondary | ICD-10-CM | POA: Diagnosis not present

## 2021-04-15 DIAGNOSIS — I4891 Unspecified atrial fibrillation: Secondary | ICD-10-CM | POA: Diagnosis not present

## 2021-04-15 DIAGNOSIS — Z8052 Family history of malignant neoplasm of bladder: Secondary | ICD-10-CM | POA: Diagnosis not present

## 2021-04-15 DIAGNOSIS — Z8673 Personal history of transient ischemic attack (TIA), and cerebral infarction without residual deficits: Secondary | ICD-10-CM | POA: Diagnosis not present

## 2021-04-15 DIAGNOSIS — Z888 Allergy status to other drugs, medicaments and biological substances status: Secondary | ICD-10-CM | POA: Diagnosis not present

## 2021-04-15 DIAGNOSIS — Z87891 Personal history of nicotine dependence: Secondary | ICD-10-CM | POA: Diagnosis not present

## 2021-04-15 DIAGNOSIS — Z79899 Other long term (current) drug therapy: Secondary | ICD-10-CM | POA: Insufficient documentation

## 2021-04-15 DIAGNOSIS — Z806 Family history of leukemia: Secondary | ICD-10-CM | POA: Insufficient documentation

## 2021-04-15 DIAGNOSIS — H2511 Age-related nuclear cataract, right eye: Secondary | ICD-10-CM | POA: Diagnosis not present

## 2021-04-15 DIAGNOSIS — G629 Polyneuropathy, unspecified: Secondary | ICD-10-CM

## 2021-04-15 HISTORY — PX: CATARACT EXTRACTION W/PHACO: SHX586

## 2021-04-15 SURGERY — PHACOEMULSIFICATION, CATARACT, WITH IOL INSERTION
Anesthesia: Monitor Anesthesia Care | Site: Eye | Laterality: Right

## 2021-04-15 MED ORDER — TETRACAINE HCL 0.5 % OP SOLN
1.0000 [drp] | OPHTHALMIC | Status: DC | PRN
  Administered 2021-04-15 (×3): 1 [drp] via OPHTHALMIC

## 2021-04-15 MED ORDER — BRIMONIDINE TARTRATE-TIMOLOL 0.2-0.5 % OP SOLN
OPHTHALMIC | Status: DC | PRN
  Administered 2021-04-15: 1 [drp] via OPHTHALMIC

## 2021-04-15 MED ORDER — NA CHONDROIT SULF-NA HYALURON 40-17 MG/ML IO SOLN
INTRAOCULAR | Status: DC | PRN
  Administered 2021-04-15: 1 mL via INTRAOCULAR

## 2021-04-15 MED ORDER — GABAPENTIN 100 MG PO CAPS: 100.0000 mg | ORAL_CAPSULE | Freq: Three times a day (TID) | ORAL | 4 refills | Status: DC

## 2021-04-15 MED ORDER — LACTATED RINGERS IV SOLN: INTRAVENOUS | Status: DC

## 2021-04-15 MED ORDER — FENTANYL CITRATE (PF) 100 MCG/2ML IJ SOLN
INTRAMUSCULAR | Status: DC | PRN
  Administered 2021-04-15: 50 ug via INTRAVENOUS

## 2021-04-15 MED ORDER — EPINEPHRINE PF 1 MG/ML IJ SOLN
INTRAOCULAR | Status: DC | PRN
  Administered 2021-04-15: 52 mL via OPHTHALMIC

## 2021-04-15 MED ORDER — MIDAZOLAM HCL 2 MG/2ML IJ SOLN
INTRAMUSCULAR | Status: DC | PRN
  Administered 2021-04-15: 1 mg via INTRAVENOUS

## 2021-04-15 MED ORDER — LIDOCAINE HCL (PF) 2 % IJ SOLN
INTRAOCULAR | Status: DC | PRN
  Administered 2021-04-15: 1 mL

## 2021-04-15 MED ORDER — MOXIFLOXACIN HCL 0.5 % OP SOLN
OPHTHALMIC | Status: DC | PRN
  Administered 2021-04-15: 0.2 mL via OPHTHALMIC

## 2021-04-15 MED ORDER — ARMC OPHTHALMIC DILATING DROPS
1.0000 "application " | OPHTHALMIC | Status: DC | PRN
  Administered 2021-04-15 (×3): 1 via OPHTHALMIC

## 2021-04-15 SURGICAL SUPPLY — 19 items
CANNULA ANT/CHMB 27GA (MISCELLANEOUS) ×4 IMPLANT
GLOVE SURG LX 8.0 MICRO (GLOVE) ×1
GLOVE SURG LX STRL 8.0 MICRO (GLOVE) ×1 IMPLANT
GLOVE SURG TRIUMPH 8.0 PF LTX (GLOVE) ×6 IMPLANT
GOWN STRL REUS W/ TWL LRG LVL3 (GOWN DISPOSABLE) ×2 IMPLANT
GOWN STRL REUS W/TWL LRG LVL3 (GOWN DISPOSABLE) ×4
LENS IOL TECNIS EYHANCE 19.0 (Intraocular Lens) ×2 IMPLANT
MARKER SKIN DUAL TIP RULER LAB (MISCELLANEOUS) ×2 IMPLANT
NEEDLE FILTER BLUNT 18X 1/2SAF (NEEDLE) ×1
NEEDLE FILTER BLUNT 18X1 1/2 (NEEDLE) ×1 IMPLANT
PACK EYE AFTER SURG (MISCELLANEOUS) ×2 IMPLANT
PACK OPTHALMIC (MISCELLANEOUS) ×2 IMPLANT
PACK PORFILIO (MISCELLANEOUS) ×2 IMPLANT
SUT ETHILON 10-0 CS-B-6CS-B-6 (SUTURE)
SUTURE EHLN 10-0 CS-B-6CS-B-6 (SUTURE) IMPLANT
SYR 3ML LL SCALE MARK (SYRINGE) ×2 IMPLANT
SYR TB 1ML LUER SLIP (SYRINGE) ×2 IMPLANT
WATER STERILE IRR 250ML POUR (IV SOLUTION) ×2 IMPLANT
WIPE NON LINTING 3.25X3.25 (MISCELLANEOUS) ×2 IMPLANT

## 2021-04-15 NOTE — Op Note (Signed)
PREOPERATIVE DIAGNOSIS:  Nuclear sclerotic cataract of the right eye.   POSTOPERATIVE DIAGNOSIS:  Cataract   OPERATIVE PROCEDURE:@   SURGEON:  Birder Robson, MD.   ANESTHESIA:  Anesthesiologist: Elgie Collard, MD CRNA: Mayme Genta, CRNA; Nyoka Cowden, CRNA  1.      Managed anesthesia care. 2.      0.53ml of Shugarcaine was instilled in the eye following the paracentesis.   COMPLICATIONS:  None.   TECHNIQUE:   Stop and chop   DESCRIPTION OF PROCEDURE:  The patient was examined and consented in the preoperative holding area where the aforementioned topical anesthesia was applied to the right eye and then brought back to the Operating Room where the right eye was prepped and draped in the usual sterile ophthalmic fashion and a lid speculum was placed. A paracentesis was created with the side port blade and the anterior chamber was filled with viscoelastic. A near clear corneal incision was performed with the steel keratome. A continuous curvilinear capsulorrhexis was performed with a cystotome followed by the capsulorrhexis forceps. Hydrodissection and hydrodelineation were carried out with BSS on a blunt cannula. The lens was removed in a stop and chop  technique and the remaining cortical material was removed with the irrigation-aspiration handpiece. The capsular bag was inflated with viscoelastic and the Technis [id number]  lens was placed in the capsular bag without complication. The remaining viscoelastic was removed from the eye with the irrigation-aspiration handpiece. The wounds were hydrated. The anterior chamber was flushed with BSS and the eye was inflated to physiologic pressure. 0.78ml of Vigamox was placed in the anterior chamber. The wounds were found to be water tight. The eye was dressed with Combigan. The patient was given protective glasses to wear throughout the day and a shield with which to sleep tonight. The patient was also given drops with which to begin a drop  regimen today and will follow-up with me in one day. Implant Name Type Inv. Item Serial No. Manufacturer Lot No. LRB No. Used Action  LENS IOL TECNIS EYHANCE 19.0 - [id number] Intraocular Lens LENS IOL TECNIS EYHANCE 19.0 [id number] JOHNSON   Right 1 Implanted   Procedure(s) with comments: CATARACT EXTRACTION PHACO AND INTRAOCULAR LENS PLACEMENT (IOC) RIGHT (Right) - 7.98 0:47.2  Electronically signed: Birder Robson 04/15/2021 11:34 AM

## 2021-04-15 NOTE — Anesthesia Preprocedure Evaluation (Addendum)
Anesthesia Evaluation  Patient identified by MRN, date of birth, ID band Patient awake    Reviewed: Allergy & Precautions, H&P , NPO status , Patient's Chart, lab work & pertinent test results  Airway Mallampati: II  TM Distance: >3 FB Neck ROM: full    Dental no notable dental hx.    Pulmonary former smoker,    Pulmonary exam normal        Cardiovascular hypertension, On Medications Normal cardiovascular exam Rhythm:regular Rate:Normal     Neuro/Psych  Headaches, PSYCHIATRIC DISORDERS CVA    GI/Hepatic Neg liver ROS, Medicated,  Endo/Other  Hypothyroidism   Renal/GU negative Renal ROS     Musculoskeletal   Abdominal   Peds  Hematology   Anesthesia Other Findings   Reproductive/Obstetrics                           Anesthesia Physical Anesthesia Plan  ASA: III  Anesthesia Plan: MAC   Post-op Pain Management:    Induction:   PONV Risk Score and Plan: 1 and Treatment may vary due to age or medical condition  Airway Management Planned:   Additional Equipment:   Intra-op Plan:   Post-operative Plan:   Informed Consent: I have reviewed the patients History and Physical, chart, labs and discussed the procedure including the risks, benefits and alternatives for the proposed anesthesia with the patient or authorized representative who has indicated his/her understanding and acceptance.       Plan Discussed with:   Anesthesia Plan Comments:         Anesthesia Quick Evaluation

## 2021-04-15 NOTE — Transfer of Care (Signed)
Immediate Anesthesia Transfer of Care Note  Patient: Ian Sanders  Procedure(s) Performed: CATARACT EXTRACTION PHACO AND INTRAOCULAR LENS PLACEMENT (IOC) RIGHT (Right Eye)  Patient Location: PACU  Anesthesia Type: MAC  Level of Consciousness: awake, alert  and patient cooperative  Airway and Oxygen Therapy: Patient Spontanous Breathing and Patient connected to supplemental oxygen  Post-op Assessment: Post-op Vital signs reviewed, Patient's Cardiovascular Status Stable, Respiratory Function Stable, Patent Airway and No signs of Nausea or vomiting  Post-op Vital Signs: Reviewed and stable  Complications: No complications documented.

## 2021-04-15 NOTE — Telephone Encounter (Signed)
[facility] faxed refill request for the following medications:  gabapentin (NEURONTIN) 100 MG capsule    Please advise.

## 2021-04-15 NOTE — Anesthesia Postprocedure Evaluation (Signed)
Anesthesia Post Note  Patient: Ian Sanders  Procedure(s) Performed: CATARACT EXTRACTION PHACO AND INTRAOCULAR LENS PLACEMENT (IOC) RIGHT (Right Eye)     Patient location during evaluation: PACU Anesthesia Type: MAC Level of consciousness: awake and alert Pain management: pain level controlled Vital Signs Assessment: post-procedure vital signs reviewed and stable Respiratory status: spontaneous breathing Cardiovascular status: stable Anesthetic complications: no   No complications documented.  Gillian Scarce

## 2021-04-15 NOTE — H&P (Signed)
[facility]   Primary Care Physician:  Birdie Sons, MD Ophthalmologist: Dr. George Ina  Pre-Procedure History & Physical: HPI:  Ian Sanders is a 80 y.o. male here for cataract surgery.   Past Medical History:  Diagnosis Date  . Atrial fibrillation ([place])   . Basal cell carcinoma   . Benign paroxysmal positional vertigo   . Cancer ([place])   . History of chicken pox   . History of measles   . History of mumps   . Hypertension   . Mononeuritis of unspecified site   . Other seborrheic keratosis   . Stroke ([place]) 06/2020  . 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 Traumatic subdural hematoma without loss of consciousness ([place]) 06/25/2020  . Unspecified hypothyroidism     Past Surgical History:  Procedure Laterality Date  . Carotid Doppler Ultrasound  04/02/2010   Small amount Calcified plaque bilaterally, no significant stenosis.  . COLONOSCOPY  05/11/2014   Tubular Adenoma. Dr. Allen Norris  . COLONOSCOPY WITH PROPOFOL N/A 05/23/2018   Procedure: COLONOSCOPY WITH PROPOFOL;  Surgeon: [name], MD;  Location: [facility] ENDOSCOPY;  Service: Gastroenterology;  Laterality: N/A;  . DOPPLER ECHOCARDIOGRAPHY  04/02/2010   Mild left atrial dilation. Normal right atrium. No valvular disease. No thrombus. Normal LV function. EF>55%  . ESOPHAGOGASTRODUODENOSCOPY (EGD) WITH PROPOFOL N/A 08/16/2019   Procedure: ESOPHAGOGASTRODUODENOSCOPY (EGD) WITH PROPOFOL;  Surgeon: [name], MD;  Location: [facility] ENDOSCOPY;  Service: Gastroenterology;  Laterality: N/A;  . FINGER AMPUTATION    . HERNIA REPAIR    . MRI Brain with and without contrast  03/20/2010   Suggestive of basal ganglia lacunar infarct  . PROSTATECTOMY  1995   Abdominal  . SKIN SURGERY     multiple  . Thumb surgery      Prior to Admission medications   Medication Sig Start Date End Date Taking? Authorizing Provider  Biotin 10 MG TABS Take 8 mg by mouth daily.    Yes [provider]  cyclobenzaprine (FLEXERIL) 10 MG tablet Take 0.5 tablets (5 mg total) by mouth 2 (two) times daily as needed. 09/26/20  Yes Birdie Sons, MD  diltiazem (CARDIZEM) 30 MG tablet Take 1 tablet (30 mg total) by mouth once daily as needed (palpitations) 06/08/20  Yes [provider]  Ferrous Sulfate 134 MG TABS Take 1 tablet by mouth daily.   Yes [provider]  gabapentin (NEURONTIN) 100 MG capsule Take 1 capsule (100 mg total) by mouth 3 (three) times daily. 01/01/21  Yes Birdie Sons, MD  Leuprolide Acetate, 6 Month, (LUPRON) 45 MG injection Inject 45 mg into the muscle every 6 (six) months.   Yes [provider]  losartan (COZAAR) 25 MG tablet Take 50 mg by mouth daily. 08/20/20 08/20/21 Yes [provider]  Potassium 99 MG TABS Take by mouth.   Yes [provider]  tolterodine (DETROL LA) 4 MG 24 hr capsule Take 4 mg by mouth daily. 12/25/20  Yes [provider]  traMADol (ULTRAM) 50 MG tablet Take 1 tablet (50 mg total) by mouth every 6 (six) hours as needed. 03/07/21  Yes Birdie Sons, MD  Trospium Chloride 60 MG CP24 Take 1 capsule (60 mg total) by mouth daily. 12/03/20  Yes McGowan, Hunt Oris, PA-C  Vitamin A 2400 MCG (8000 UT) CAPS Take by mouth.   Yes [provider]  zolpidem (AMBIEN) 10 MG tablet Take 0.5-1 tablets (5-10 mg total) by mouth  at bedtime as needed for sleep. 01/01/21  Yes Birdie Sons, MD  aspirin 81 MG chewable tablet Chew by mouth daily. Patient not taking: Reported on 04/03/2021    [provider]  calcium-vitamin D (OSCAL) 250-125 MG-UNIT per tablet Take 1 tablet by mouth 2 (two) times daily.     [provider]  chlorthalidone (HYGROTON) 25 MG tablet Take 1 tablet (25 mg total) by mouth daily. Patient taking differently: Take 25 mg by mouth every other day. 07/15/17   Minna Merritts, MD  Cyanocobalamin (VITAMIN B-12 PO) Take by mouth daily.    [provider]   fesoterodine (TOVIAZ) 4 MG TB24 tablet Take 4 mg by mouth as needed. Patient not taking: Reported on 04/03/2021    [provider]  flecainide (TAMBOCOR) 50 MG tablet Take 1 tablet (50 mg total) by mouth 2 (two) times daily. 07/15/17   Minna Merritts, MD  omeprazole (PRILOSEC) 40 MG capsule TAKE ONE CAPSULE DAILY. Patient taking differently: Take 40 mg by mouth daily. 06/06/18   Birdie Sons, MD    Allergies as of 03/25/2021 - Review Complete 03/17/2021  Allergen Reaction Noted  . Lisinopril Swelling 02/12/2015  . Myrbetriq [mirabegron] Swelling 12/26/2015  . Other Other (See Comments) 08/20/2020  . Triamterene  08/27/2004  . Amlodipine Other (See Comments) 06/11/2020  . Atorvastatin Other (See Comments) 06/19/2015  . Dabigatran etexilate mesylate Other (See Comments) 12/14/2011  . Dabigatran etexilate mesylate Other (See Comments) 12/14/2011    Family History  Problem Relation Age of Onset  . Heart disease Mother 23       CABG  . Hypertension Mother   . Heart attack Mother   . Hypertension Brother   . Heart disease Sister        stents  . Prostate cancer Brother   . Kidney cancer Brother   . Skin cancer Brother   . Hypertension Brother   . Bladder Cancer Sister   . Leukemia Father   . Stroke Father        multiple  . Cerebral aneurysm Sister   . Kidney cancer Brother     Social History   Socioeconomic History  . Marital status: Married    Spouse name: Not on file  . Number of children: 2  . Years of education: Not on file  . Highest education level: Associate degree: occupational, Hotel manager, or vocational program  Occupational History  . Occupation: Retired  Tobacco Use  . Smoking status: Former Smoker    Packs/day: 0.00    Years: 12.00    Pack years: 0.00    Types: Cigarettes  . Smokeless tobacco: Current User    Types: Chew  . Tobacco comment: quit over 45 years ago  Vaping Use  . Vaping Use: Never used  Substance and Sexual Activity  .  Alcohol use: Not Currently    Alcohol/week: 5.0 standard drinks    Types: 5 Cans of beer per week    Comment: quit drinking summer 2021  . Drug use: No  . Sexual activity: Not on file  Other Topics Concern  . 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 Difficulty of Paying Living Expenses: Not hard at all  Food Insecurity: No Food Insecurity  . Worried About Charity fundraiser  in the Last Year: Never true  . Ran Out of Food in the Last Year: Never true  Transportation Needs: No Transportation Needs  . Lack of Transportation (Medical): No  . Lack of Transportation (Non-Medical): No  Physical Activity: Inactive  . Days of Exercise per Week: 0 days  . Minutes of Exercise per Session: 0 min  Stress: No Stress Concern Present  . Feeling of Stress : Not at all  Social Connections: Moderately Isolated  . Frequency of Communication with Friends and Family: More than three times a week  . Frequency of Social Gatherings with Friends and Family: More than three times a week  . Attends Religious Services: Never  . Active Member of Clubs or Organizations: No  . Attends Archivist Meetings: Never  . Marital Status: Married  Human resources officer Violence: Not At Risk  . Fear of Current or Ex-Partner: No  . Emotionally Abused: No  . Physically Abused: No  . Sexually Abused: No    Review of Systems: See HPI, otherwise negative ROS  Physical Exam: BP (!) 147/65   Pulse 71   Temp (!) 97.1 F (36.2 C) (Temporal)   Resp 20   Wt 75.8 kg   SpO2 99%   BMI 23.96 kg/m  General:   Alert,  pleasant and cooperative in NAD Head:  Normocephalic and atraumatic. Respiratory:  Normal work of breathing. Cardiovascular:  RRR  Impression/Plan: Ian Sanders is here for cataract surgery.  Risks, benefits,  limitations, and alternatives regarding cataract surgery have been reviewed with the patient.  Questions have been answered.  All parties agreeable.   Birder Robson, MD  04/15/2021, 11:10 AM

## 2021-04-21 ENCOUNTER — Other Ambulatory Visit: Payer: Self-pay | Admitting: Family Medicine

## 2021-04-21 ENCOUNTER — Encounter: Payer: Self-pay | Admitting: Ophthalmology

## 2021-04-21 DIAGNOSIS — H2512 Age-related nuclear cataract, left eye: Secondary | ICD-10-CM | POA: Diagnosis not present

## 2021-04-21 NOTE — Telephone Encounter (Signed)
Requested medication (s) are due for refill today: yes   Requested medication (s) are on the active medication list: yes   Last refill:  03/07/2021  Future visit scheduled: yes  Notes to clinic: this refill cannot be delegated    Requested Prescriptions  Pending Prescriptions Disp Refills   zolpidem (AMBIEN) 10 MG tablet [Pharmacy Med Name: ZOLPIDEM TARTRATE 10 MG TABLET] 30 tablet 0    Sig: TAKE ONE-HALF TO ONE TABLET AT BEDTIME.      Not Delegated - Psychiatry:  Anxiolytics/Hypnotics Failed - 04/21/2021 12:39 PM      Failed - This refill cannot be delegated      Failed - Urine Drug Screen completed in last 360 days      Passed - Valid encounter within last 6 months    Recent Outpatient Visits           1 month ago Prediabetes   [facility] Birdie Sons, MD   8 months ago Annual physical exam   [facility] Birdie Sons, MD   9 months ago Traumatic subdural hematoma without loss of consciousness, initial encounter [facility])   [facility] Birdie Sons, MD   1 year ago Essential hypertension   [facility] Birdie Sons, MD   2 years ago 67 of both feet   [facility] Birdie Sons, MD       Future Appointments             In 2 months McGowan, Gordan Payment [place]   In 5 months Caryn Section, Kirstie Peri, MD [facility] [place]   In 5 months [name], [place]

## 2021-04-24 DIAGNOSIS — K7460 Unspecified cirrhosis of liver: Secondary | ICD-10-CM | POA: Diagnosis not present

## 2021-04-25 LAB — CBC WITH DIFFERENTIAL/PLATELET
Basophils Absolute: 0 10*3/uL (ref 0.0–0.2)
Basos: 0 %
EOS (ABSOLUTE): 0.1 10*3/uL (ref 0.0–0.4)
Eos: 2 %
Hematocrit: 35.4 % — ABNORMAL LOW (ref 37.5–51.0)
Hemoglobin: 11.7 g/dL — ABNORMAL LOW (ref 13.0–17.7)
Immature Grans (Abs): 0 10*3/uL (ref 0.0–0.1)
Immature Granulocytes: 0 %
Lymphocytes Absolute: 0.4 10*3/uL — ABNORMAL LOW (ref 0.7–3.1)
Lymphs: 14 %
MCH: 25.1 pg — ABNORMAL LOW (ref 26.6–33.0)
MCHC: 33.1 g/dL (ref 31.5–35.7)
MCV: 76 fL — ABNORMAL LOW (ref 79–97)
Monocytes Absolute: 0.2 10*3/uL (ref 0.1–0.9)
Monocytes: 8 %
Neutrophils Absolute: 1.8 10*3/uL (ref 1.4–7.0)
Neutrophils: 76 %
Platelets: 47 10*3/uL — CL (ref 150–450)
RBC: 4.67 x10E6/uL (ref 4.14–5.80)
RDW: 14.5 % (ref 11.6–15.4)
WBC: 2.4 10*3/uL — CL (ref 3.4–10.8)

## 2021-04-25 LAB — COMPREHENSIVE METABOLIC PANEL
ALT: 10 IU/L (ref 0–44)
AST: 22 IU/L (ref 0–40)
Albumin/Globulin Ratio: 2 (ref 1.2–2.2)
Albumin: 4.7 g/dL (ref 3.7–4.7)
Alkaline Phosphatase: 89 IU/L (ref 44–121)
BUN/Creatinine Ratio: 9 — ABNORMAL LOW (ref 10–24)
BUN: 7 mg/dL — ABNORMAL LOW (ref 8–27)
Bilirubin Total: 0.8 mg/dL (ref 0.0–1.2)
CO2: 28 mmol/L (ref 20–29)
Calcium: 9 mg/dL (ref 8.6–10.2)
Chloride: 93 mmol/L — ABNORMAL LOW (ref 96–106)
Creatinine, Ser: 0.79 mg/dL (ref 0.76–1.27)
Globulin, Total: 2.4 g/dL (ref 1.5–4.5)
Glucose: 103 mg/dL — ABNORMAL HIGH (ref 65–99)
Potassium: 3.8 mmol/L (ref 3.5–5.2)
Sodium: 134 mmol/L (ref 134–144)
Total Protein: 7.1 g/dL (ref 6.0–8.5)
eGFR: 90 mL/min/{1.73_m2} (ref >59)

## 2021-04-25 LAB — PROTIME-INR
INR: 1.1 (ref 0.9–1.2)
Prothrombin Time: 11.7 s (ref 9.1–12.0)

## 2021-05-07 ENCOUNTER — Telehealth: Payer: Self-pay | Admitting: Gastroenterology

## 2021-05-07 NOTE — Telephone Encounter (Signed)
Patient is requesting a copy of his blood work results. Please call to Advise

## 2021-05-07 NOTE — Telephone Encounter (Signed)
Called patient back and explained his lab results and then he requested a copy. I told him that I had already printed them out and left at the front door. Patient agreed.

## 2021-05-14 DIAGNOSIS — G20 Parkinson's disease: Secondary | ICD-10-CM | POA: Diagnosis not present

## 2021-05-14 DIAGNOSIS — Z8673 Personal history of transient ischemic attack (TIA), and cerebral infarction without residual deficits: Secondary | ICD-10-CM | POA: Diagnosis not present

## 2021-05-14 DIAGNOSIS — Z8679 Personal history of other diseases of the circulatory system: Secondary | ICD-10-CM | POA: Diagnosis not present

## 2021-05-20 ENCOUNTER — Encounter: Payer: Self-pay | Admitting: Ophthalmology

## 2021-05-20 ENCOUNTER — Ambulatory Visit: Payer: PPO | Admitting: Anesthesiology

## 2021-05-20 ENCOUNTER — Ambulatory Visit
Admission: RE | Admit: 2021-05-20 | Discharge: 2021-05-20 | Disposition: A | Discharge status: Home / Self Care | Payer: PPO | Attending: Ophthalmology | Admitting: Ophthalmology

## 2021-05-20 ENCOUNTER — Other Ambulatory Visit: Payer: Self-pay

## 2021-05-20 ENCOUNTER — Encounter
Admission: RE | Disposition: A | Discharge status: Home / Self Care | Payer: Self-pay | Source: Home / Self Care | Attending: Ophthalmology

## 2021-05-20 DIAGNOSIS — Z9841 Cataract extraction status, right eye: Secondary | ICD-10-CM | POA: Insufficient documentation

## 2021-05-20 DIAGNOSIS — H2512 Age-related nuclear cataract, left eye: Secondary | ICD-10-CM | POA: Diagnosis not present

## 2021-05-20 DIAGNOSIS — Z961 Presence of intraocular lens: Secondary | ICD-10-CM | POA: Insufficient documentation

## 2021-05-20 DIAGNOSIS — Z79899 Other long term (current) drug therapy: Secondary | ICD-10-CM | POA: Diagnosis not present

## 2021-05-20 DIAGNOSIS — Z8673 Personal history of transient ischemic attack (TIA), and cerebral infarction without residual deficits: Secondary | ICD-10-CM | POA: Diagnosis not present

## 2021-05-20 DIAGNOSIS — H25812 Combined forms of age-related cataract, left eye: Secondary | ICD-10-CM | POA: Diagnosis not present

## 2021-05-20 DIAGNOSIS — Z87891 Personal history of nicotine dependence: Secondary | ICD-10-CM | POA: Diagnosis not present

## 2021-05-20 DIAGNOSIS — Z888 Allergy status to other drugs, medicaments and biological substances status: Secondary | ICD-10-CM | POA: Insufficient documentation

## 2021-05-20 HISTORY — PX: CATARACT EXTRACTION W/PHACO: SHX586

## 2021-05-20 SURGERY — PHACOEMULSIFICATION, CATARACT, WITH IOL INSERTION
Anesthesia: Monitor Anesthesia Care | Site: Eye | Laterality: Left

## 2021-05-20 MED ORDER — MIDAZOLAM HCL 2 MG/2ML IJ SOLN
INTRAMUSCULAR | Status: DC | PRN
  Administered 2021-05-20: 1 mg via INTRAVENOUS

## 2021-05-20 MED ORDER — SIGHTPATH DOSE#1 NA CHONDROIT SULF-NA HYALURON 40-17 MG/ML IO SOLN
INTRAOCULAR | Status: DC | PRN
  Administered 2021-05-20: 1 mL via INTRAOCULAR

## 2021-05-20 MED ORDER — FENTANYL CITRATE (PF) 100 MCG/2ML IJ SOLN
INTRAMUSCULAR | Status: DC | PRN
  Administered 2021-05-20: 50 ug via INTRAVENOUS

## 2021-05-20 MED ORDER — SIGHTPATH DOSE#1 BSS IO SOLN
INTRAOCULAR | Status: DC | PRN
  Administered 2021-05-20: 55 mL via OPHTHALMIC

## 2021-05-20 MED ORDER — LIDOCAINE HCL (PF) 2 % IJ SOLN
INTRAOCULAR | Status: DC | PRN
  Administered 2021-05-20: 1 mL

## 2021-05-20 MED ORDER — PHENYLEPHRINE HCL 10 % OP SOLN
1.0000 [drp] | OPHTHALMIC | Status: DC | PRN
  Administered 2021-05-20 (×3): 1 [drp] via OPHTHALMIC

## 2021-05-20 MED ORDER — BRIMONIDINE TARTRATE-TIMOLOL 0.2-0.5 % OP SOLN
OPHTHALMIC | Status: DC | PRN
  Administered 2021-05-20: 1 [drp] via OPHTHALMIC

## 2021-05-20 MED ORDER — TETRACAINE HCL 0.5 % OP SOLN
1.0000 [drp] | OPHTHALMIC | Status: DC | PRN
  Administered 2021-05-20 (×3): 1 [drp] via OPHTHALMIC

## 2021-05-20 MED ORDER — LACTATED RINGERS IV SOLN: INTRAVENOUS | Status: DC

## 2021-05-20 MED ORDER — MOXIFLOXACIN HCL 0.5 % OP SOLN
OPHTHALMIC | Status: DC | PRN
  Administered 2021-05-20: 0.2 mL via OPHTHALMIC

## 2021-05-20 MED ORDER — CYCLOPENTOLATE HCL 2 % OP SOLN
1.0000 [drp] | OPHTHALMIC | Status: DC | PRN
  Administered 2021-05-20 (×3): 1 [drp] via OPHTHALMIC

## 2021-05-20 SURGICAL SUPPLY — 15 items
CANNULA ANT/CHMB 27GA (MISCELLANEOUS) ×4 IMPLANT
GLOVE SURG ENC TEXT LTX SZ8 (GLOVE) ×2 IMPLANT
GLOVE SURG TRIUMPH 8.0 PF LTX (GLOVE) ×2 IMPLANT
GOWN STRL REUS W/ TWL LRG LVL3 (GOWN DISPOSABLE) ×2 IMPLANT
GOWN STRL REUS W/TWL LRG LVL3 (GOWN DISPOSABLE) ×4
LENS IOL TECNIS EYHANCE 19.5 (Intraocular Lens) ×2 IMPLANT
MARKER SKIN DUAL TIP RULER LAB (MISCELLANEOUS) ×2 IMPLANT
NEEDLE FILTER BLUNT 18X 1/2SAF (NEEDLE) ×1
NEEDLE FILTER BLUNT 18X1 1/2 (NEEDLE) ×1 IMPLANT
PACK EYE AFTER SURG (MISCELLANEOUS) ×2 IMPLANT
SYR 3ML LL SCALE MARK (SYRINGE) ×2 IMPLANT
SYR TB 1ML LUER SLIP (SYRINGE) ×2 IMPLANT
TIP ITREPID SGL USE BENT I/A (SUCTIONS) ×2 IMPLANT
WATER STERILE IRR 250ML POUR (IV SOLUTION) ×2 IMPLANT
WIPE NON LINTING 3.25X3.25 (MISCELLANEOUS) ×2 IMPLANT

## 2021-05-20 NOTE — Transfer of Care (Signed)
Immediate Anesthesia Transfer of Care Note  Patient: Ian Sanders  Procedure(s) Performed: CATARACT EXTRACTION PHACO AND INTRAOCULAR LENS PLACEMENT (IOC) LEFT 3.37 00:32.0 (Left: Eye)  Patient Location: PACU  Anesthesia Type: MAC  Level of Consciousness: awake, alert  and patient cooperative  Airway and Oxygen Therapy: Patient Spontanous Breathing and Patient connected to supplemental oxygen  Post-op Assessment: Post-op Vital signs reviewed, Patient's Cardiovascular Status Stable, Respiratory Function Stable, Patent Airway and No signs of Nausea or vomiting  Post-op Vital Signs: Reviewed and stable  Complications: No notable events documented.

## 2021-05-20 NOTE — Anesthesia Preprocedure Evaluation (Signed)
Anesthesia Evaluation  Patient identified by MRN, date of birth, ID band Patient awake    Reviewed: Allergy & Precautions, H&P , NPO status , Patient's Chart, lab work & pertinent test results  Airway Mallampati: II  TM Distance: >3 FB Neck ROM: full    Dental no notable dental hx.    Pulmonary former smoker,    Pulmonary exam normal        Cardiovascular hypertension, On Medications Normal cardiovascular exam Rhythm:regular Rate:Normal     Neuro/Psych  Headaches, PSYCHIATRIC DISORDERS CVA    GI/Hepatic Neg liver ROS,   Endo/Other  Hypothyroidism   Renal/GU negative Renal ROS     Musculoskeletal   Abdominal   Peds  Hematology   Anesthesia Other Findings   Reproductive/Obstetrics                             Anesthesia Physical  Anesthesia Plan  ASA: 3  Anesthesia Plan: MAC   Post-op Pain Management:    Induction:   PONV Risk Score and Plan: 1 and Treatment may vary due to age or medical condition  Airway Management Planned:   Additional Equipment:   Intra-op Plan:   Post-operative Plan:   Informed Consent: I have reviewed the patients History and Physical, chart, labs and discussed the procedure including the risks, benefits and alternatives for the proposed anesthesia with the patient or authorized representative who has indicated his/her understanding and acceptance.       Plan Discussed with:   Anesthesia Plan Comments:         Anesthesia Quick Evaluation

## 2021-05-20 NOTE — Op Note (Signed)
PREOPERATIVE DIAGNOSIS:  Nuclear sclerotic cataract of the left eye.   POSTOPERATIVE DIAGNOSIS:  Nuclear sclerotic cataract of the left eye.   OPERATIVE PROCEDURE:ORPROCALL@   SURGEON:  Birder Robson, MD.   ANESTHESIA:  Anesthesiologist: April Manson, MD CRNA: Mayme Genta, CRNA  1.      Managed anesthesia care. 2.     0.61ml of Shugarcaine was instilled following the paracentesis   COMPLICATIONS:  None.   TECHNIQUE:   Stop and chop   DESCRIPTION OF PROCEDURE:  The patient was examined and consented in the preoperative holding area where the aforementioned topical anesthesia was applied to the left eye and then brought back to the Operating Room where the left eye was prepped and draped in the usual sterile ophthalmic fashion and a lid speculum was placed. A paracentesis was created with the side port blade and the anterior chamber was filled with viscoelastic. A near clear corneal incision was performed with the steel keratome. A continuous curvilinear capsulorrhexis was performed with a cystotome followed by the capsulorrhexis forceps. Hydrodissection and hydrodelineation were carried out with BSS on a blunt cannula. The lens was removed in a stop and chop  technique and the remaining cortical material was removed with the irrigation-aspiration handpiece. The capsular bag was inflated with viscoelastic and the Technis [id number] lens was placed in the capsular bag without complication. The remaining viscoelastic was removed from the eye with the irrigation-aspiration handpiece. The wounds were hydrated. The anterior chamber was flushed with BSS and the eye was inflated to physiologic pressure. 0.69ml Vigamox was placed in the anterior chamber. The wounds were found to be water tight. The eye was dressed with Combigan. The patient was given protective glasses to wear throughout the day and a shield with which to sleep tonight. The patient was also given drops with which to begin a drop  regimen today and will follow-up with me in one day. Implant Name Type Inv. Item Serial No. Manufacturer Lot No. LRB No. Used Action  LENS IOL TECNIS EYHANCE 19.5 - [id number] Intraocular Lens LENS IOL TECNIS EYHANCE 19.5 [id number] JOHNSON   Left 1 Implanted    Procedure(s) with comments: CATARACT EXTRACTION PHACO AND INTRAOCULAR LENS PLACEMENT (IOC) LEFT (Left) - Requests arrival 10A or after  Electronically signed: Birder Robson 05/20/2021 1:46 PM

## 2021-05-20 NOTE — Anesthesia Procedure Notes (Signed)
Procedure Name: MAC Date/Time: 05/20/2021 1:35 PM Performed by: Mayme Genta, CRNA Pre-anesthesia Checklist: Patient identified, Emergency Drugs available, Suction available, Timeout performed and Patient being monitored Patient Re-evaluated:Patient Re-evaluated prior to induction Oxygen Delivery Method: Nasal cannula Placement Confirmation: positive ETCO2

## 2021-05-20 NOTE — Anesthesia Postprocedure Evaluation (Signed)
Anesthesia Post Note  Patient: Ian Sanders  Procedure(s) Performed: CATARACT EXTRACTION PHACO AND INTRAOCULAR LENS PLACEMENT (IOC) LEFT 3.37 00:32.0 (Left: Eye)     Patient location during evaluation: PACU Anesthesia Type: MAC Level of consciousness: awake and alert Pain management: pain level controlled Vital Signs Assessment: post-procedure vital signs reviewed and stable Respiratory status: spontaneous breathing, nonlabored ventilation and respiratory function stable Cardiovascular status: stable and blood pressure returned to baseline Postop Assessment: no apparent nausea or vomiting Anesthetic complications: no   No notable events documented.  April Manson

## 2021-05-20 NOTE — H&P (Signed)
[facility]   Primary Care Physician:  Birdie Sons, MD Ophthalmologist: Dr. George Ina  Pre-Procedure History & Physical: HPI:  Ian Sanders is a 80 y.o. male here for cataract surgery.   Past Medical History:  Diagnosis Date   Atrial fibrillation ([facility])    Basal cell carcinoma    Benign paroxysmal positional vertigo    Cancer ([place])    History of chicken pox    History of measles    History of mumps    Hypertension    Mononeuritis of unspecified site    Other seborrheic keratosis    Stroke ([place]) 06/2020   Transient ischemic attack (TIA), and cerebral infarction without residual deficits(V12.54)    Traumatic subdural hematoma without loss of consciousness ([place]) 06/25/2020   Unspecified hypothyroidism     Past Surgical History:  Procedure Laterality Date   Carotid Doppler Ultrasound  04/02/2010   Small amount Calcified plaque bilaterally, no significant stenosis.   CATARACT EXTRACTION W/PHACO Right 04/15/2021   Procedure: CATARACT EXTRACTION PHACO AND INTRAOCULAR LENS PLACEMENT ([place]) RIGHT;  Surgeon: Birder Robson, MD;  Location: [facility];  Service: Ophthalmology;  Laterality: Right;  7.98 0:47.2   COLONOSCOPY  05/11/2014   Tubular Adenoma. Dr. Allen Norris   COLONOSCOPY WITH PROPOFOL N/A 05/23/2018   Procedure: COLONOSCOPY WITH PROPOFOL;  Surgeon: [name], MD;  Location: [facility] ENDOSCOPY;  Service: Gastroenterology;  Laterality: N/A;   DOPPLER ECHOCARDIOGRAPHY  04/02/2010   Mild left atrial dilation. Normal right atrium. No valvular disease. No thrombus. Normal LV function. EF>55%   ESOPHAGOGASTRODUODENOSCOPY (EGD) WITH PROPOFOL N/A 08/16/2019   Procedure: ESOPHAGOGASTRODUODENOSCOPY (EGD) WITH PROPOFOL;  Surgeon: [name], MD;  Location: [facility] ENDOSCOPY;  Service: Gastroenterology;  Laterality: N/A;   FINGER AMPUTATION     HERNIA REPAIR     MRI Brain with and without contrast  03/20/2010   Suggestive of basal ganglia lacunar infarct   PROSTATECTOMY  1995    Abdominal   SKIN SURGERY     multiple   Thumb surgery      Prior to Admission medications   Medication Sig Start Date End Date Taking? Authorizing Provider  Biotin 10 MG TABS Take 8 mg by mouth daily.    Yes [provider]  calcium-vitamin D (OSCAL) 250-125 MG-UNIT per tablet Take 1 tablet by mouth 2 (two) times daily.    Yes [provider]  chlorthalidone (HYGROTON) 25 MG tablet Take 1 tablet (25 mg total) by mouth daily. Patient taking differently: Take 25 mg by mouth every other day. 07/15/17  Yes Minna Merritts, MD  Cyanocobalamin (VITAMIN B-12 PO) Take by mouth daily.   Yes [provider]  cyclobenzaprine (FLEXERIL) 10 MG tablet Take 0.5 tablets (5 mg total) by mouth 2 (two) times daily as needed. 09/26/20  Yes Birdie Sons, MD  diltiazem (CARDIZEM) 30 MG tablet Take 1 tablet (30 mg total) by mouth once daily as needed (palpitations) 06/08/20  Yes [provider]  Ferrous Sulfate 134 MG TABS Take 1 tablet by mouth daily.   Yes [provider]  fesoterodine (TOVIAZ) 4 MG TB24 tablet Take 4 mg by mouth as needed.   Yes [provider]  flecainide (TAMBOCOR) 50 MG tablet Take 1 tablet (50 mg total) by mouth 2 (two) times daily. 07/15/17  Yes Minna Merritts, MD  gabapentin (NEURONTIN) 100 MG capsule Take 1 capsule (100 mg total) by mouth 3 (three) times daily. 04/15/21  Yes Birdie Sons, MD  losartan (COZAAR)  25 MG tablet Take 50 mg by mouth daily. 08/20/20 08/20/21 Yes [provider]  omeprazole (PRILOSEC) 40 MG capsule TAKE ONE CAPSULE DAILY. Patient taking differently: Take 40 mg by mouth daily. 06/06/18  Yes Birdie Sons, MD  Potassium 99 MG TABS Take by mouth.   Yes [provider]  tolterodine (DETROL LA) 4 MG 24 hr capsule Take 4 mg by mouth daily. 12/25/20  Yes [provider]  traMADol (ULTRAM) 50 MG tablet Take 1 tablet (50 mg total) by mouth every 6 (six) hours as needed. 03/07/21  Yes  Birdie Sons, MD  Trospium Chloride 60 MG CP24 Take 1 capsule (60 mg total) by mouth daily. 12/03/20  Yes McGowan, Hunt Oris, PA-C  Vitamin A 2400 MCG (8000 UT) CAPS Take by mouth.   Yes [provider]  zolpidem (AMBIEN) 10 MG tablet TAKE ONE-HALF TO ONE TABLET AT BEDTIME. 04/22/21  Yes Birdie Sons, MD  aspirin 81 MG chewable tablet Chew by mouth daily. Patient not taking: Reported on 04/03/2021    [provider]  Leuprolide Acetate, 6 Month, (LUPRON) 45 MG injection Inject 45 mg into the muscle every 6 (six) months.    [provider]    Allergies as of 03/25/2021 - Review Complete 03/17/2021  Allergen Reaction Noted   Lisinopril Swelling 02/12/2015   Myrbetriq [mirabegron] Swelling 12/26/2015   Other Other (See Comments) 08/20/2020   Triamterene  08/27/2004   Amlodipine Other (See Comments) 06/11/2020   Atorvastatin Other (See Comments) 06/19/2015   Dabigatran etexilate mesylate Other (See Comments) 12/14/2011   Dabigatran etexilate mesylate Other (See Comments) 12/14/2011    Family History  Problem Relation Age of Onset   Heart disease Mother 68       CABG   Hypertension Mother    Heart attack Mother    Hypertension Brother    Heart disease Sister        stents   Prostate cancer Brother    Kidney cancer Brother    Skin cancer Brother    Hypertension Brother    Bladder Cancer Sister    Leukemia Father    Stroke Father        multiple   Cerebral aneurysm Sister    Kidney cancer Brother     Social History   Socioeconomic History   Marital status: Married    Spouse name: Not on file   Number of children: 2   Years of education: Not on file   Highest education level: Associate degree: occupational, Hotel manager, or vocational program  Occupational History   Occupation: Retired  Tobacco Use   Smoking status: Former    Packs/day: 0.00    Years: 12.00    Pack years: 0.00    Types: Cigarettes   Smokeless tobacco: Current    Types:  Chew   Tobacco comments:    quit over 45 years ago  Vaping Use   Vaping Use: Never used  Substance and Sexual Activity   Alcohol use: Not Currently    Alcohol/week: 5.0 standard drinks    Types: 5 Cans of beer per week    Comment: quit drinking summer 2021   Drug use: No   Sexual activity: Not on file  Other Topics Concern   Not on file  Social History Narrative   Lives with wife at home ; alcohol abuse; quit smoking 2016; 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 Respiratory:  Normal work of breathing. Cardiovascular:  RRR  Impression/Plan: Ian Sanders is here for cataract surgery.  Risks, benefits, limitations, and alternatives regarding cataract surgery have been reviewed with the patient.  Questions have been answered.  All parties agreeable.   Birder Robson, MD  05/20/2021, 1:22 PM

## 2021-05-21 ENCOUNTER — Encounter: Payer: Self-pay | Admitting: Ophthalmology

## 2021-05-29 ENCOUNTER — Encounter: Payer: Self-pay | Admitting: Ophthalmology

## 2021-06-02 ENCOUNTER — Ambulatory Visit (INDEPENDENT_AMBULATORY_CARE_PROVIDER_SITE_OTHER): Payer: PPO | Admitting: *Deleted

## 2021-06-02 ENCOUNTER — Other Ambulatory Visit: Payer: Self-pay

## 2021-06-02 DIAGNOSIS — F1010 Alcohol abuse, uncomplicated: Secondary | ICD-10-CM | POA: Diagnosis not present

## 2021-06-02 DIAGNOSIS — R002 Palpitations: Secondary | ICD-10-CM | POA: Diagnosis not present

## 2021-06-02 DIAGNOSIS — D61818 Other pancytopenia: Secondary | ICD-10-CM | POA: Diagnosis not present

## 2021-06-02 DIAGNOSIS — D696 Thrombocytopenia, unspecified: Secondary | ICD-10-CM | POA: Diagnosis not present

## 2021-06-02 DIAGNOSIS — C61 Malignant neoplasm of prostate: Secondary | ICD-10-CM

## 2021-06-02 DIAGNOSIS — I480 Paroxysmal atrial fibrillation: Secondary | ICD-10-CM | POA: Diagnosis not present

## 2021-06-02 DIAGNOSIS — I10 Essential (primary) hypertension: Secondary | ICD-10-CM | POA: Diagnosis not present

## 2021-06-02 MED ORDER — LEUPROLIDE ACETATE (6 MONTH) 45 MG ~~LOC~~ KIT
45.0000 mg | PACK | Freq: Once | SUBCUTANEOUS | Status: AC
  Administered 2021-06-02: 45 mg via SUBCUTANEOUS

## 2021-06-02 NOTE — Progress Notes (Signed)
Eligard SubQ Injection   Due to Prostate Cancer patient is present today for a Eligard Injection.  Medication: Eligard 6 month Dose: 45 mg  Location: right  Lot: [id number] Exp: 01/20204  Patient tolerated well, no complications were noted  Performed by: Gaspar Cola CMA   Per Dr. Dr. Bernardo Heater  patient is to continue therapy for 6 months . Patient's next follow up was scheduled for 12/22/2021.This appointment was scheduled using wheel and given to patient today along with reminder continue on Vitamin D 800-1000iu and Calium 1000-1200mg  daily while on Androgen Deprivation Therapy.  PA approval dates:

## 2021-06-26 ENCOUNTER — Other Ambulatory Visit: Payer: Self-pay | Admitting: *Deleted

## 2021-06-26 ENCOUNTER — Other Ambulatory Visit: Payer: Self-pay | Admitting: Urology

## 2021-07-01 ENCOUNTER — Other Ambulatory Visit: Payer: Self-pay | Admitting: Urology

## 2021-07-01 NOTE — Telephone Encounter (Signed)
Should pt be on Detrol. Per his last visit with shannon   3. Urge incontinence -We will switch from Toviaz 4 mg daily tropsium XL 60 mg daily

## 2021-07-02 NOTE — Progress Notes (Signed)
01/24/2019 10:00 AM   Ian Sanders [birth year] [id number]  Referring provider: Birdie Sons, [place],  [place] [address]  Urological history: 1. Prostate cancer - Patient underwent RRP by Dr. Domingo Cocking at [facility] in 1995 - PSA remained below 1 until 2014 when it rose to 9.8 ng/mL - Started on ADT with [place] in 03/15 - PSA pending  2. High risk hematuria - Former smoker - CT urogram 01/31/2018 noted a 1.2 cm partially exophytic lesion within the inferior pole of the right kidney with suggestion of mild contrast enhancement, raising the possibility of solid renal neoplasm.  There is a 9 mm lesion within the medial superior left kidney with suggestion of possible associated enhancement.  No nephroureterolithiasis. No hydronephrosis. No abnormal filling defects within the opacified renal collecting systems and ureters.  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  Prominent vascularity and stranding lateral to the ascending colon favored to be sequelae of portal venous hypertension.  Recommend clinical correlation for signs of colitis.  Cholelithiasis.  Bilateral femoral head AVN.  There is a 5 mm right lung nodule - cysto 02/09/2018 NED - MRI on 05/17/2018 noted Bosniak category 1 and category 2 renal cysts in both kidneys. No suspicious renal masses - Cysto 12/2020 Prominent hypervascularity of urethra  - CT w/wo 02/2021 Normal adrenal glands. Bilateral too small to characterize renal lesions. 1.7 cm interpolar right renal lesion anteriorly is likely a cyst or minimally complex cyst, slightly enlarged compared to the prior.  There is also a 2.2 cm interpolar left renal cyst.  Minimally complex 1.2 cm interpolar right renal lesion including on 76/12 with similar in size on 01/31/2018 and characterized on interval MRI. Normal urinary bladder. -UA neg for micro heme  3. 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 male who presents today for follow up.  His wife made him circle urinary leakage, but he states the leakage isn't enough to mention.    A couple of months ago, he started to experience burning.  An EMT was sent to his house and he was told he had an UTI.  A doctor called something in and the dysuria abated.  This service was provided through his insurance company.  Patient denies any modifying or aggravating factors.  Patient denies any gross hematuria, dysuria or suprapubic/flank pain.  Patient denies any fevers, chills, nausea or vomiting.    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   CATARACT EXTRACTION W/PHACO Right 04/15/2021  Procedure: CATARACT EXTRACTION PHACO AND INTRAOCULAR LENS PLACEMENT ([place]) RIGHT;  Surgeon: Birder Robson, MD;  Location: [place] [facility];  Service: Ophthalmology;  Laterality: Right;  7.98 0:47.2   CATARACT EXTRACTION W/PHACO Left 05/20/2021   Procedure: CATARACT EXTRACTION PHACO AND INTRAOCULAR LENS PLACEMENT ([facility]) LEFT 3.37 00:32.0;  Surgeon: Birder Robson, MD;  Location: [facility];  Service:  Ophthalmology;  Laterality: Left;  Requests arrival 10A or after   COLONOSCOPY  05/11/2014   Tubular Adenoma. Dr. Allen Norris   COLONOSCOPY WITH PROPOFOL N/A 05/23/2018   Procedure: COLONOSCOPY WITH PROPOFOL;  Surgeon: [name], MD;  Location[facility] ENDOSCOPY;  Service: Gastroenterology;  Laterality: N/A;   DOPPLER ECHOCARDIOGRAPHY  04/02/2010   Mild left atrial dilation. Normal right atrium. No valvular disease. No thrombus. Normal LV function. EF>55%   ESOPHAGOGASTRODUODENOSCOPY (EGD) WITH PROPOFOL N/A 08/16/2019   Procedure: ESOPHAGOGASTRODUODENOSCOPY (EGD) WITH PROPOFOL;  Surgeon: [name], MD;  Location: [facility] ENDOSCOPY;  Service: Gastroenterology;  Laterality: N/A;   FINGER AMPUTATION     HERNIA REPAIR     MRI Brain with and without contrast  03/20/2010   Suggestive of basal ganglia lacunar infarct   PROSTATECTOMY  1995   Abdominal   SKIN SURGERY     multiple   Thumb surgery      Home Medications:  Allergies as of 07/03/2021       Reactions   Lisinopril Swelling   Tongue swelling   Myrbetriq [mirabegron] Swelling   Of the tongue   Other Other (See Comments)   Symptomatic bradycardia    Triamterene    Other reaction(s): ITCHING,WATERING EYES   Amlodipine Other (See Comments)   Peripheral edema   Atorvastatin Other (See Comments)   Dizziness and Fatigue   Dabigatran Etexilate Mesylate Other (See Comments)   Stomach ulcers (Pradaxa)   Dabigatran Etexilate Mesylate Other (See Comments)   Stomach ulcers (Pradaxa)        Medication List        Accurate as of July 03, 2021 10:00 AM. If you have any questions, ask your nurse or doctor.          STOP taking these medications    Biotin 10 MG Tabs Stopped by: Aimee Heldman, PA-C   diltiazem 30 MG tablet Commonly known as: CARDIZEM Stopped by: Dominick Zertuche, PA-C   Trospium Chloride 60 MG Cp24 Stopped by: Kairon Shock, PA-C       TAKE these medications    aspirin 81 MG chewable tablet Chew by mouth  daily.   calcium-vitamin D 250-125 MG-UNIT tablet Commonly known as: OSCAL Take 1 tablet by mouth 2 (two) times daily.   chlorthalidone 25 MG tablet Commonly known as: HYGROTON Take 1 tablet (25 mg total) by mouth daily. What changed: when to take this   cyclobenzaprine 10 MG tablet Commonly known as: FLEXERIL Take 0.5 tablets (5 mg total) by mouth 2 (two) times daily as needed.   Ferrous Sulfate 134 MG Tabs Take 1 tablet by mouth daily.   flecainide 50 MG tablet Commonly known as: TAMBOCOR Take 1 tablet (50 mg total) by mouth 2 (two) times daily.   gabapentin 100 MG capsule Commonly known as: NEURONTIN Take 1 capsule (100 mg total) by mouth 3 (three) times daily.   Leuprolide Acetate (6 Month) 45 MG injection Commonly known as: LUPRON Inject 45 mg into the muscle every 6 (six) months.   losartan 25 MG tablet Commonly known as: COZAAR Take 50 mg by mouth daily.   omeprazole 40 MG  capsule Commonly known as: PRILOSEC TAKE ONE CAPSULE DAILY.   Potassium 99 MG Tabs Take by mouth.   tolterodine 4 MG 24 hr capsule Commonly known as: DETROL LA Take 1 capsule (4 mg total) by mouth daily.   traMADol 50 MG tablet Commonly known as: ULTRAM Take 1 tablet (50 mg total) by mouth every 6 (six) hours as needed.   Vitamin A 2400 MCG (8000 UT) Caps Take by mouth.   VITAMIN B-12 PO Take by mouth daily.   zolpidem 10 MG tablet Commonly known as: AMBIEN TAKE ONE-HALF TO ONE TABLET AT BEDTIME.        Allergies:  Allergies  Allergen Reactions   Lisinopril Swelling    Tongue swelling   Myrbetriq [Mirabegron] Swelling    Of the tongue   Other Other (See Comments)    Symptomatic bradycardia    Triamterene     Other reaction(s): ITCHING,WATERING EYES   Amlodipine Other (See Comments)    Peripheral edema   Atorvastatin Other (See Comments)    Dizziness and Fatigue   Dabigatran Etexilate Mesylate Other (See Comments)    Stomach ulcers (Pradaxa)   Dabigatran  Etexilate Mesylate Other (See Comments)    Stomach ulcers (Pradaxa)    Family History: Family History  Problem Relation Age of Onset   Heart disease Mother 81       CABG   Hypertension Mother    Heart attack Mother    Hypertension Brother    Heart disease Sister        stents   Prostate cancer Brother    Kidney cancer Brother    Skin cancer Brother    Hypertension Brother    Bladder Cancer Sister    Leukemia Father    Stroke Father        multiple   Cerebral aneurysm Sister    Kidney cancer Brother     Social History:  reports that he has quit smoking. His smoking use included cigarettes. His smokeless tobacco use includes chew. He reports that he does not currently use alcohol after a past usage of about 5.0 standard drinks per week. He reports that he does not use drugs.  ROS: Pertinent ROS in HPI  Physical Exam: BP (!) 160/76   Pulse 61   Ht '5\' 10"'$  (1.778 m)   Wt 165 lb (74.8 kg)   BMI 23.68 kg/m   Constitutional:  Well nourished. Alert and oriented, No acute distress. HEENT: [place] AT, mask in place  Trachea midline Cardiovascular: No clubbing, cyanosis, or edema. Respiratory: Normal respiratory effort, no increased work of breathing. Neurologic: Grossly intact, no focal deficits, moving all 4 extremities. Psychiatric: Normal mood and affect.   Laboratory Data: Lab Results  Component Value Date   WBC 2.4 (LL) 04/24/2021   HGB 11.7 (L) 04/24/2021   HCT 35.4 (L) 04/24/2021   MCV 76 (L) 04/24/2021   PLT 47 (LL) 04/24/2021    Lab Results  Component Value Date   CREATININE 0.79 04/24/2021   Lab Results  Component Value Date   HGBA1C 5.3 03/17/2021   Lab Results  Component Value Date   AST 22 04/24/2021   Lab Results  Component Value Date   ALT 10 04/24/2021   Urinalysis Component     Latest Ref Rng & Units 07/03/2021  Specific Gravity, UA     1.005 - 1.030 1.010  pH, UA     5.0 - 7.5 6.5  Color, UA     Yellow Yellow  Appearance Ur     Clear  Clear  Leukocytes,UA     Negative Negative  Protein,UA     Negative/Trace Negative  Glucose, UA     Negative Negative  Ketones, UA     Negative Negative  RBC, UA     Negative Negative  Bilirubin, UA     Negative Negative  Urobilinogen, Ur     0.2 - 1.0 mg/dL 0.2  Nitrite, UA     Negative Negative  Microscopic Examination      See below:   Component     Latest Ref Rng & Units 07/03/2021  WBC, UA     0 - 5 /hpf None seen  RBC     0 - 2 /hpf 0-2  Epithelial Cells (non renal)     0 - 10 /hpf 0-10  Bacteria, UA     None seen/Few None seen    I have reviewed the labs.  Pertinent Imaging  Results for AUM, TEER (MRN [id number]) as of 07/03/2021 09:49  Ref. Range 07/03/2021 09:48  Scan Result Unknown 0 ml    Assessment & Plan:    1. Prostate cancer -PSA pending -ADT given in July 2022 -RTC in 6 months for next injection  2. High risk hematuria -UA neg for micro heme  3. Urge incontinence -PVR 0  mL -continue Detrol LA 4 mg daily-refills given  Return in about 6 months (around 01/03/2022) for UA, PVR, PSA and Eligard .  Zara Council, PA-C  [facility] [address] [place], [place] [address] [phone]

## 2021-07-03 ENCOUNTER — Ambulatory Visit (INDEPENDENT_AMBULATORY_CARE_PROVIDER_SITE_OTHER): Payer: PPO | Admitting: Urology

## 2021-07-03 ENCOUNTER — Encounter: Payer: Self-pay | Admitting: Urology

## 2021-07-03 ENCOUNTER — Other Ambulatory Visit: Payer: Self-pay

## 2021-07-03 VITALS — BP 160/76 | HR 61 | Ht 70.0 in | Wt 165.0 lb

## 2021-07-03 DIAGNOSIS — N368 Other specified disorders of urethra: Secondary | ICD-10-CM

## 2021-07-03 DIAGNOSIS — N3941 Urge incontinence: Secondary | ICD-10-CM | POA: Diagnosis not present

## 2021-07-03 DIAGNOSIS — C61 Malignant neoplasm of prostate: Secondary | ICD-10-CM

## 2021-07-03 LAB — URINALYSIS, COMPLETE
Bilirubin, UA: NEGATIVE
Glucose, UA: NEGATIVE
Ketones, UA: NEGATIVE
Leukocytes,UA: NEGATIVE
Nitrite, UA: NEGATIVE
Protein,UA: NEGATIVE
RBC, UA: NEGATIVE
Specific Gravity, UA: 1.01 (ref 1.005–1.030)
Urobilinogen, Ur: 0.2 mg/dL (ref 0.2–1.0)
pH, UA: 6.5 (ref 5.0–7.5)

## 2021-07-03 LAB — MICROSCOPIC EXAMINATION
Bacteria, UA: NONE SEEN
WBC, UA: NONE SEEN /hpf (ref 0–5)

## 2021-07-03 LAB — BLADDER SCAN AMB NON-IMAGING: Scan Result: 0

## 2021-07-03 MED ORDER — TOLTERODINE TARTRATE ER 4 MG PO CP24: 4.0000 mg | ORAL_CAPSULE | Freq: Every day | ORAL | 3 refills | Status: DC

## 2021-07-04 ENCOUNTER — Telehealth: Payer: Self-pay

## 2021-07-04 LAB — PSA: Prostate Specific Ag, Serum: <0.1 ng/mL (ref 0.0–4.0)

## 2021-07-04 NOTE — Telephone Encounter (Signed)
Pt called triage line stating he missed a call. Called pt back gave him PSA results per Larene Beach McGowab's result note. PT voiced understanding.

## 2021-07-12 ENCOUNTER — Observation Stay
Admission: EM | Admit: 2021-07-12 | Discharge: 2021-07-13 | Disposition: A | Discharge status: Home / Self Care | Payer: PPO | Attending: Internal Medicine | Admitting: Internal Medicine

## 2021-07-12 ENCOUNTER — Encounter: Payer: Self-pay | Admitting: Emergency Medicine

## 2021-07-12 ENCOUNTER — Emergency Department: Payer: PPO

## 2021-07-12 ENCOUNTER — Other Ambulatory Visit: Payer: Self-pay

## 2021-07-12 DIAGNOSIS — Z8673 Personal history of transient ischemic attack (TIA), and cerebral infarction without residual deficits: Secondary | ICD-10-CM | POA: Insufficient documentation

## 2021-07-12 DIAGNOSIS — Z87891 Personal history of nicotine dependence: Secondary | ICD-10-CM | POA: Insufficient documentation

## 2021-07-12 DIAGNOSIS — Z20822 Contact with and (suspected) exposure to covid-19: Secondary | ICD-10-CM | POA: Insufficient documentation

## 2021-07-12 DIAGNOSIS — Z7982 Long term (current) use of aspirin: Secondary | ICD-10-CM | POA: Diagnosis not present

## 2021-07-12 DIAGNOSIS — R29818 Other symptoms and signs involving the nervous system: Secondary | ICD-10-CM | POA: Diagnosis not present

## 2021-07-12 DIAGNOSIS — I10 Essential (primary) hypertension: Secondary | ICD-10-CM | POA: Diagnosis not present

## 2021-07-12 DIAGNOSIS — E871 Hypo-osmolality and hyponatremia: Secondary | ICD-10-CM | POA: Diagnosis not present

## 2021-07-12 DIAGNOSIS — R531 Weakness: Secondary | ICD-10-CM | POA: Diagnosis not present

## 2021-07-12 DIAGNOSIS — D61818 Other pancytopenia: Secondary | ICD-10-CM | POA: Diagnosis present

## 2021-07-12 DIAGNOSIS — R5381 Other malaise: Secondary | ICD-10-CM | POA: Diagnosis not present

## 2021-07-12 DIAGNOSIS — R42 Dizziness and giddiness: Secondary | ICD-10-CM

## 2021-07-12 DIAGNOSIS — E039 Hypothyroidism, unspecified: Secondary | ICD-10-CM | POA: Diagnosis not present

## 2021-07-12 DIAGNOSIS — I639 Cerebral infarction, unspecified: Secondary | ICD-10-CM | POA: Diagnosis not present

## 2021-07-12 DIAGNOSIS — Z79899 Other long term (current) drug therapy: Secondary | ICD-10-CM | POA: Insufficient documentation

## 2021-07-12 DIAGNOSIS — Z85828 Personal history of other malignant neoplasm of skin: Secondary | ICD-10-CM | POA: Diagnosis not present

## 2021-07-12 DIAGNOSIS — Z8546 Personal history of malignant neoplasm of prostate: Secondary | ICD-10-CM | POA: Diagnosis not present

## 2021-07-12 DIAGNOSIS — I6782 Cerebral ischemia: Secondary | ICD-10-CM | POA: Diagnosis not present

## 2021-07-12 DIAGNOSIS — R161 Splenomegaly, not elsewhere classified: Secondary | ICD-10-CM | POA: Diagnosis present

## 2021-07-12 DIAGNOSIS — K7460 Unspecified cirrhosis of liver: Secondary | ICD-10-CM | POA: Diagnosis present

## 2021-07-12 DIAGNOSIS — I480 Paroxysmal atrial fibrillation: Secondary | ICD-10-CM | POA: Diagnosis not present

## 2021-07-12 LAB — CBG MONITORING, ED: Glucose-Capillary: 127 mg/dL — ABNORMAL HIGH (ref 70–99)

## 2021-07-12 LAB — COMPREHENSIVE METABOLIC PANEL
ALT: 13 U/L (ref 0–44)
AST: 28 U/L (ref 15–41)
Albumin: 4.1 g/dL (ref 3.5–5.0)
Alkaline Phosphatase: 68 U/L (ref 38–126)
Anion gap: 11 (ref 5–15)
BUN: 10 mg/dL (ref 8–23)
CO2: 25 mmol/L (ref 22–32)
Calcium: 9.3 mg/dL (ref 8.9–10.3)
Chloride: 92 mmol/L — ABNORMAL LOW (ref 98–111)
Creatinine, Ser: 0.72 mg/dL (ref 0.61–1.24)
GFR, Estimated: >60 mL/min (ref >60)
Glucose, Bld: 124 mg/dL — ABNORMAL HIGH (ref 70–99)
Potassium: 3.6 mmol/L (ref 3.5–5.1)
Sodium: 128 mmol/L — ABNORMAL LOW (ref 135–145)
Total Bilirubin: 2.4 mg/dL — ABNORMAL HIGH (ref 0.3–1.2)
Total Protein: 7.1 g/dL (ref 6.5–8.1)

## 2021-07-12 LAB — CBC
HCT: 35.3 % — ABNORMAL LOW (ref 39.0–52.0)
Hemoglobin: 12.4 g/dL — ABNORMAL LOW (ref 13.0–17.0)
MCH: 27.3 pg (ref 26.0–34.0)
MCHC: 35.1 g/dL (ref 30.0–36.0)
MCV: 77.8 fL — ABNORMAL LOW (ref 80.0–100.0)
Platelets: 50 10*3/uL — ABNORMAL LOW (ref 150–400)
RBC: 4.54 MIL/uL (ref 4.22–5.81)
RDW: 13.8 % (ref 11.5–15.5)
WBC: 3.5 10*3/uL — ABNORMAL LOW (ref 4.0–10.5)
nRBC: 0 % (ref 0.0–0.2)

## 2021-07-12 LAB — PROTIME-INR
INR: 1.3 — ABNORMAL HIGH (ref 0.8–1.2)
Prothrombin Time: 16.2 seconds — ABNORMAL HIGH (ref 11.4–15.2)

## 2021-07-12 LAB — DIFFERENTIAL
Abs Immature Granulocytes: 0.01 10*3/uL (ref 0.00–0.07)
Basophils Absolute: 0 10*3/uL (ref 0.0–0.1)
Basophils Relative: 0 %
Eosinophils Absolute: 0.1 10*3/uL (ref 0.0–0.5)
Eosinophils Relative: 2 %
Immature Granulocytes: 0 %
Lymphocytes Relative: 9 %
Lymphs Abs: 0.3 10*3/uL — ABNORMAL LOW (ref 0.7–4.0)
Monocytes Absolute: 0.2 10*3/uL (ref 0.1–1.0)
Monocytes Relative: 6 %
Neutro Abs: 2.9 10*3/uL (ref 1.7–7.7)
Neutrophils Relative %: 83 %
Smear Review: NORMAL

## 2021-07-12 LAB — APTT: aPTT: 31 seconds (ref 24–36)

## 2021-07-12 NOTE — ED Triage Notes (Signed)
Pt to ED via EMS with c/o possible stroke. Pt reports symptoms started at approx 12pm today. Reports difficulty using R arm. Pt with noted facial symmetry intact, c/o numbness to R arm/R side of face, no drift noted. Pt able to name objects, no aphasia noted, no c/o difficulty with vision. Pt with noted slow speech in triage, pt's wife reports different from baseline.

## 2021-07-12 NOTE — ED Triage Notes (Signed)
Pt comes ems from home with weakness and back pain. Pt was concerned for stroke but pt is neurologically intact except for some generalized weakness. Wife thought maybe his speech was off. 175/80. HR 72. CBG 132. Started around when he woke up from a nap today. Felt like his joints were on fire.

## 2021-07-12 NOTE — ED Notes (Signed)
Pt arrives to room via wheelchair, 'I cant walk, my legs dont work,' pt states uses walker at home. Pt able to stand with assistance and shuffle legs equally to bed. Pt states 'I came because I believe I had a stroke, this morning I couldn't pick my head up off the bed, I worked it a while and then noticed my R arm/leg was weak.' reports had stroke 28yr ago without any deficits. Pt able to speak in complete sentences, face symmetrical, no obvious weakness appreciated in extremities.

## 2021-07-13 ENCOUNTER — Observation Stay
Admit: 2021-07-13 | Discharge: 2021-07-13 | Disposition: A | Discharge status: Home / Self Care | Payer: PPO | Attending: Family Medicine | Admitting: Family Medicine

## 2021-07-13 ENCOUNTER — Observation Stay: Payer: PPO

## 2021-07-13 ENCOUNTER — Emergency Department: Payer: PPO

## 2021-07-13 ENCOUNTER — Encounter: Payer: Self-pay | Admitting: Family Medicine

## 2021-07-13 DIAGNOSIS — I6502 Occlusion and stenosis of left vertebral artery: Secondary | ICD-10-CM | POA: Diagnosis not present

## 2021-07-13 DIAGNOSIS — I6782 Cerebral ischemia: Secondary | ICD-10-CM | POA: Diagnosis not present

## 2021-07-13 DIAGNOSIS — I639 Cerebral infarction, unspecified: Secondary | ICD-10-CM | POA: Diagnosis not present

## 2021-07-13 DIAGNOSIS — G459 Transient cerebral ischemic attack, unspecified: Secondary | ICD-10-CM

## 2021-07-13 DIAGNOSIS — R29818 Other symptoms and signs involving the nervous system: Secondary | ICD-10-CM | POA: Diagnosis not present

## 2021-07-13 DIAGNOSIS — R42 Dizziness and giddiness: Secondary | ICD-10-CM | POA: Diagnosis not present

## 2021-07-13 DIAGNOSIS — I6523 Occlusion and stenosis of bilateral carotid arteries: Secondary | ICD-10-CM | POA: Diagnosis not present

## 2021-07-13 DIAGNOSIS — R531 Weakness: Secondary | ICD-10-CM

## 2021-07-13 DIAGNOSIS — D61818 Other pancytopenia: Secondary | ICD-10-CM

## 2021-07-13 DIAGNOSIS — I10 Essential (primary) hypertension: Secondary | ICD-10-CM | POA: Diagnosis not present

## 2021-07-13 DIAGNOSIS — R161 Splenomegaly, not elsewhere classified: Secondary | ICD-10-CM

## 2021-07-13 DIAGNOSIS — I480 Paroxysmal atrial fibrillation: Secondary | ICD-10-CM

## 2021-07-13 DIAGNOSIS — I6389 Other cerebral infarction: Secondary | ICD-10-CM

## 2021-07-13 DIAGNOSIS — R200 Anesthesia of skin: Secondary | ICD-10-CM | POA: Diagnosis not present

## 2021-07-13 LAB — CBC
HCT: 31.7 % — ABNORMAL LOW (ref 39.0–52.0)
Hemoglobin: 11 g/dL — ABNORMAL LOW (ref 13.0–17.0)
MCH: 26.7 pg (ref 26.0–34.0)
MCHC: 34.7 g/dL (ref 30.0–36.0)
MCV: 76.9 fL — ABNORMAL LOW (ref 80.0–100.0)
Platelets: 38 10*3/uL — ABNORMAL LOW (ref 150–400)
RBC: 4.12 MIL/uL — ABNORMAL LOW (ref 4.22–5.81)
RDW: 13.8 % (ref 11.5–15.5)
WBC: 1.9 10*3/uL — ABNORMAL LOW (ref 4.0–10.5)
nRBC: 0 % (ref 0.0–0.2)

## 2021-07-13 LAB — RESP PANEL BY RT-PCR (FLU A&B, COVID) ARPGX2
Influenza A by PCR: NEGATIVE
Influenza B by PCR: NEGATIVE
SARS Coronavirus 2 by RT PCR: NEGATIVE

## 2021-07-13 LAB — LIPID PANEL
Cholesterol: 124 mg/dL (ref 0–200)
HDL: 32 mg/dL — ABNORMAL LOW (ref >40)
LDL Cholesterol: 71 mg/dL (ref 0–99)
Total CHOL/HDL Ratio: 3.9 RATIO
Triglycerides: 104 mg/dL (ref <150)
VLDL: 21 mg/dL (ref 0–40)

## 2021-07-13 LAB — ECHOCARDIOGRAM COMPLETE BUBBLE STUDY
AR max vel: 2.91 cm2
AV Area VTI: 2.93 cm2
AV Area mean vel: 2.85 cm2
AV Mean grad: 2 mmHg
AV Peak grad: 4.8 mmHg
Ao pk vel: 1.09 m/s
Area-P 1/2: 2.93 cm2
S' Lateral: 2.7 cm

## 2021-07-13 LAB — HEMOGLOBIN A1C
Hgb A1c MFr Bld: 5.1 % (ref 4.8–5.6)
Mean Plasma Glucose: 99.67 mg/dL

## 2021-07-13 LAB — URINALYSIS, COMPLETE (UACMP) WITH MICROSCOPIC
Bilirubin Urine: NEGATIVE
Glucose, UA: NEGATIVE mg/dL
Hgb urine dipstick: NEGATIVE
Ketones, ur: 5 mg/dL — AB
Leukocytes,Ua: NEGATIVE
Nitrite: NEGATIVE
Protein, ur: NEGATIVE mg/dL
Specific Gravity, Urine: 1.013 (ref 1.005–1.030)
pH: 7 (ref 5.0–8.0)

## 2021-07-13 LAB — BASIC METABOLIC PANEL
Anion gap: 8 (ref 5–15)
BUN: 10 mg/dL (ref 8–23)
CO2: 27 mmol/L (ref 22–32)
Calcium: 8.9 mg/dL (ref 8.9–10.3)
Chloride: 95 mmol/L — ABNORMAL LOW (ref 98–111)
Creatinine, Ser: 0.61 mg/dL (ref 0.61–1.24)
GFR, Estimated: >60 mL/min (ref >60)
Glucose, Bld: 98 mg/dL (ref 70–99)
Potassium: 3 mmol/L — ABNORMAL LOW (ref 3.5–5.1)
Sodium: 130 mmol/L — ABNORMAL LOW (ref 135–145)

## 2021-07-13 MED ORDER — STROKE: EARLY STAGES OF RECOVERY BOOK: Freq: Once | Status: DC

## 2021-07-13 MED ORDER — POTASSIUM CHLORIDE 10 MEQ/100ML IV SOLN
10.0000 meq | INTRAVENOUS | Status: AC
  Administered 2021-07-13 (×2): 10 meq via INTRAVENOUS
  Filled 2021-07-13: qty 100

## 2021-07-13 MED ORDER — GABAPENTIN 100 MG PO CAPS
100.0000 mg | ORAL_CAPSULE | Freq: Three times a day (TID) | ORAL | Status: DC
  Administered 2021-07-13: 100 mg via ORAL
  Filled 2021-07-13: qty 1

## 2021-07-13 MED ORDER — LOSARTAN POTASSIUM 50 MG PO TABS
50.0000 mg | ORAL_TABLET | Freq: Every day | ORAL | Status: DC
  Administered 2021-07-13: 50 mg via ORAL
  Filled 2021-07-13: qty 1

## 2021-07-13 MED ORDER — CYCLOBENZAPRINE HCL 10 MG PO TABS: 5.0000 mg | ORAL_TABLET | Freq: Three times a day (TID) | ORAL | Status: DC | PRN

## 2021-07-13 MED ORDER — ACETAMINOPHEN 325 MG PO TABS
650.0000 mg | ORAL_TABLET | Freq: Four times a day (QID) | ORAL | Status: DC | PRN
  Administered 2021-07-13: 650 mg via ORAL
  Filled 2021-07-13 (×2): qty 2

## 2021-07-13 MED ORDER — ONDANSETRON HCL 4 MG/2ML IJ SOLN: 4.0000 mg | Freq: Four times a day (QID) | INTRAMUSCULAR | Status: DC | PRN

## 2021-07-13 MED ORDER — SODIUM CHLORIDE 0.9 % IV SOLN: INTRAVENOUS | Status: DC

## 2021-07-13 MED ORDER — ASPIRIN 81 MG PO CHEW
81.0000 mg | CHEWABLE_TABLET | Freq: Every day | ORAL | Status: DC
  Administered 2021-07-13: 81 mg via ORAL
  Filled 2021-07-13: qty 1

## 2021-07-13 MED ORDER — CALCIUM CARBONATE-VITAMIN D 500-200 MG-UNIT PO TABS
1.0000 | ORAL_TABLET | Freq: Two times a day (BID) | ORAL | Status: DC
  Administered 2021-07-13: 1 via ORAL
  Filled 2021-07-13: qty 1

## 2021-07-13 MED ORDER — PANTOPRAZOLE SODIUM 40 MG PO TBEC
40.0000 mg | DELAYED_RELEASE_TABLET | Freq: Every day | ORAL | Status: DC
  Administered 2021-07-13: 40 mg via ORAL
  Filled 2021-07-13: qty 1

## 2021-07-13 MED ORDER — ZOLPIDEM TARTRATE 5 MG PO TABS: 5.0000 mg | ORAL_TABLET | Freq: Every evening | ORAL | Status: DC | PRN

## 2021-07-13 MED ORDER — TRAZODONE HCL 50 MG PO TABS: 25.0000 mg | ORAL_TABLET | Freq: Every evening | ORAL | Status: DC | PRN

## 2021-07-13 MED ORDER — FESOTERODINE FUMARATE ER 4 MG PO TB24
4.0000 mg | ORAL_TABLET | Freq: Every day | ORAL | Status: DC
  Administered 2021-07-13: 4 mg via ORAL
  Filled 2021-07-13: qty 1

## 2021-07-13 MED ORDER — CLOPIDOGREL BISULFATE 75 MG PO TABS
75.0000 mg | ORAL_TABLET | Freq: Every day | ORAL | Status: DC
  Administered 2021-07-13: 75 mg via ORAL
  Filled 2021-07-13: qty 1

## 2021-07-13 MED ORDER — CHLORTHALIDONE 25 MG PO TABS
25.0000 mg | ORAL_TABLET | ORAL | Status: DC
  Administered 2021-07-13: 25 mg via ORAL
  Filled 2021-07-13: qty 1

## 2021-07-13 MED ORDER — ACETAMINOPHEN 650 MG RE SUPP: 650.0000 mg | Freq: Four times a day (QID) | RECTAL | Status: DC | PRN

## 2021-07-13 MED ORDER — FLECAINIDE ACETATE 50 MG PO TABS
50.0000 mg | ORAL_TABLET | Freq: Two times a day (BID) | ORAL | Status: DC
  Administered 2021-07-13: 50 mg via ORAL
  Filled 2021-07-13 (×3): qty 1

## 2021-07-13 MED ORDER — MAGNESIUM HYDROXIDE 400 MG/5ML PO SUSP: 30.0000 mL | Freq: Every day | ORAL | Status: DC | PRN

## 2021-07-13 MED ORDER — VITAMIN B-12 100 MCG PO TABS
100.0000 ug | ORAL_TABLET | Freq: Every day | ORAL | Status: DC
  Administered 2021-07-13: 100 ug via ORAL
  Filled 2021-07-13: qty 1

## 2021-07-13 MED ORDER — ONDANSETRON HCL 4 MG PO TABS: 4.0000 mg | ORAL_TABLET | Freq: Four times a day (QID) | ORAL | Status: DC | PRN

## 2021-07-13 MED ORDER — FERROUS SULFATE 325 (65 FE) MG PO TABS
325.0000 mg | ORAL_TABLET | Freq: Every day | ORAL | Status: DC
  Administered 2021-07-13: 325 mg via ORAL
  Filled 2021-07-13: qty 1

## 2021-07-13 NOTE — Evaluation (Signed)
Physical Therapy Evaluation Patient Details Name: Ian Sanders MRN: [id number] DOB: [birth year] Today's Date: 07/13/2021   History of Present Illness  80 y.o. Caucasian male with medical history significant for atrial fibrillation, TIA and CVA, hypertension, benign positional vertigo, hypothyroidism and previous history of falls, who presented to the ER with acute onset of right-sided numbness and weakness as well as severe dizziness and associated vertigo with inability to ambulate. MRI of the brain did not show acute stroke.  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  Clinical Impression  Pt seen for PT evaluation with wife present for session. Pt reports prior to admission he was mod I with either SPC or RW, denies falls in the last 6 months. On this date, pt requires min assist for bed mobility without rails, CGA for transfers & short distance gait with RW. No focal deficits appreciated. Pt does demonstrate impaired standing balance without UE support. Pt c/o dizziness in supine that doesn't change with positions but does limit overall mobility as pt reports nausea by end of session; pt (-) for orthostatics & no nystagmus noted. Have asked for formal vestibular assessment tomorrow.  Supine: 143/62 mmHg (MAP 87) Sitting: 151/71 mmHg (MAP 92) Standing at 0: 165/70 mmHg (MAP 99)     Follow Up Recommendations Home health PT;Supervision for mobility/OOB    Equipment Recommendations  None recommended by PT    Recommendations for Other Services       Precautions / Restrictions Precautions Precautions: Fall Restrictions Weight Bearing Restrictions: No      Mobility  Bed Mobility Overal bed mobility: Needs Assistance Bed Mobility: Supine to Sit     Supine to sit: Min assist     General bed mobility comments: supine>sit with HOB elevated, stabilizing on PT's hand when  exiting R side of bed    Transfers Overall transfer level: Needs assistance   Transfers: Sit to/from Stand Sit to Stand: Min guard;From elevated surface         General transfer comment: Requires verbal cues for safe hand placement with RW use  Ambulation/Gait Ambulation/Gait assistance: Min guard Gait Distance (Feet): 30 Feet Assistive device: Rolling walker (2 wheeled) Gait Pattern/deviations: Decreased step length - left;Decreased step length - right Gait velocity: decreased, purposeful      Stairs            Wheelchair Mobility    Modified Rankin (Stroke Patients Only)       Balance Overall balance assessment: Needs assistance Sitting-balance support: Feet supported;Bilateral upper extremity supported Sitting balance-Leahy Scale: Good Sitting balance - Comments: Supervision sitting EOB reaching within BOS   Standing balance support: No upper extremity supported;During functional activity Standing balance-Leahy Scale: Fair Standing balance comment: CGA<>close supervision static standing to void with urinal, but posterior lean on EOB with BLE                             Pertinent Vitals/Pain Pain Assessment: 0-10 Pain Score: 2  Pain Location: bottom of feet Pain Descriptors / Indicators: Burning Pain Intervention(s): Limited activity within patient's tolerance;Monitored during session    Home Living Family/patient expects to be discharged to:: Private residence Living Arrangements: Spouse/significant other Available Help at Discharge: Family;Available 24 hours/day Type of Home: House Home Access: Stairs to enter   [employer] of Steps: 1+3 Home Layout: One level Home Equipment: Gilford Rile -  2 wheels;Cane - single point      Prior Function Level of Independence: Independent with assistive device(s)         Comments: Uses SPC & RW, decides which AD based on how he's feeling that day     Hand Dominance   Dominant Hand:  Right    Extremity/Trunk Assessment   Upper Extremity Assessment Upper Extremity Assessment: Generalized weakness    Lower Extremity Assessment Lower Extremity Assessment: Generalized weakness (no focal deficits appreciated)       Communication   Communication: No difficulties  Cognition Arousal/Alertness: Awake/alert Behavior During Therapy: WFL for tasks assessed/performed Overall Cognitive Status: Within Functional Limits for tasks assessed                                        General Comments General comments (skin integrity, edema, etc.): pt with continent void during session, requires assistance to thread shorts on BLE but able to pull them over hips without assistance    Exercises     Assessment/Plan    PT Assessment Patient needs continued PT services  PT Problem List Decreased strength;Decreased mobility;Decreased activity tolerance;Decreased balance       PT Treatment Interventions DME instruction;Therapeutic exercise;Balance training;Gait training;Stair training;Neuromuscular re-education;Functional mobility training;Therapeutic activities;Patient/family education    PT Goals (Current goals can be found in the Care Plan section)  Acute Rehab PT Goals Patient Stated Goal: to go home PT Goal Formulation: With patient Time For Goal Achievement: 07/27/21 Potential to Achieve Goals: Good    Frequency Min 2X/week   Barriers to discharge        Co-evaluation               AM-PAC PT "6 Clicks" Mobility  Outcome Measure Help needed turning from your back to your side while in a flat bed without using bedrails?: None Help needed moving from lying on your back to sitting on the side of a flat bed without using bedrails?: A Little Help needed moving to and from a bed to a chair (including a wheelchair)?: A Little Help needed standing up from a chair using your arms (e.g., wheelchair or bedside chair)?: A Little Help needed to walk in  hospital room?: A Little Help needed climbing 3-5 steps with a railing? : A Little 6 Click Score: 19    End of Session   Activity Tolerance:  (limited 2/2 dizziness) Patient left: in bed (sitting EOB with wife present in handoff to OT)   PT Visit Diagnosis: Unsteadiness on feet (R26.81);Muscle weakness (generalized) (M62.81);Dizziness and giddiness (R42)    Time: [id number] PT Time Calculation (min) (ACUTE ONLY): 19 min   Charges:   PT Evaluation $PT Eval Low Complexity: 1 Low PT Treatments $Therapeutic Activity: 8-22 mins        Lavone Nian, PT, DPT 07/13/21, 4:11 PM   Waunita Schooner 07/13/2021, 4:09 PM

## 2021-07-13 NOTE — ED Notes (Signed)
Applesauce and water provided

## 2021-07-13 NOTE — Consult Note (Addendum)
Neurology Consultation  Reason for Consult: Dizziness, weakness Referring Physician: Dr. Roosevelt Locks  CC: Dizziness, generalized weakness with transiently worse right-sided weakness  History is obtained from: Patient, chart-review of care everywhere neurology notes  HPI: Ian Sanders is a 80 y.o. 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 His right-sided weakness symptoms seem to have resolved.  He has not attempted to walk on his been in the hospital but reports some improvement in the lightheadedness as well but still feels that he is dizzy and not back to his baseline.  When asked how this fared with his ongoing chronic dizziness, he said this felt much worse than the dizziness that he is used to.  He did not significantly feel different in terms of his speech.  Per the ER MD note-his wife is concerned that his speech was also slower than usual.  He also reported some numbness on the right side of his body. His vitals at the time of this encounter revealed a blood pressure 128/59 and a heart rate of 56.  He reports that ever since starting propranolol he has had fluctuations in blood pressure-both high and low at times. Denies any chest pain or shortness of breath.  Denies palpitations that are new-reports some baseline ongoing palpitations.  No visual changes.  No  headaches. He has been off of anticoagulation since he had subdural hematomas bilaterally, around 12 months ago, while he was on Xarelto and has since then Xarelto has been discontinued and he has been on aspirin 81 mg daily.   LKW: 11 AM 07/13/2019 tpa given?: no, outside the window Premorbid modified Rankin scale (mRS): 2-3  ROS: Full ROS was performed and is negative except as noted in the HPI.  Past Medical History:  Diagnosis Date   Atrial fibrillation (HCC)    Basal cell carcinoma    Benign paroxysmal positional vertigo    Cancer ([place])    History of chicken pox    History of measles    History of mumps    Hypertension    Mononeuritis of unspecified site    Other seborrheic keratosis    Stroke ([place]) 06/2020   Transient ischemic attack (TIA), and cerebral infarction without residual deficits(V12.54)    Traumatic subdural hematoma without loss of consciousness ([place]) 06/25/2020   Unspecified hypothyroidism    Family History  Problem Relation Age of Onset   Heart disease Mother 46       CABG   Hypertension Mother    Heart attack Mother    Hypertension Brother    Heart disease Sister        stents   Prostate cancer Brother    Kidney cancer Brother    Skin cancer Brother    Hypertension Brother    Bladder Cancer Sister    Leukemia Father    Stroke Father        multiple   Cerebral aneurysm Sister  Kidney cancer Brother      Social History:   reports that he has quit smoking. His smoking use included cigarettes. His smokeless tobacco use includes chew. He reports that he does not currently use alcohol after a past usage of about 5.0 standard drinks per week. He reports that he does not use drugs.  Medications  Current Facility-Administered Medications:     stroke: mapping our early stages of recovery book, , Does not apply, Once, Mansy, Jan A, MD   acetaminophen (TYLENOL) tablet 650 mg, 650 mg, Oral, Q6H PRN **OR** acetaminophen (TYLENOL) suppository 650 mg, 650  mg, Rectal, Q6H PRN, Mansy, Jan A, MD   aspirin chewable tablet 81 mg, 81 mg, Oral, Daily, Mansy, Jan A, MD, 81 mg at 07/13/21 [id number]   calcium-vitamin D (OSCAL WITH D) 500-200 MG-UNIT per tablet 1 tablet, 1 tablet, Oral, BID, Mansy, Jan A, MD, 1 tablet at 07/13/21 [id number]   chlorthalidone (HYGROTON) tablet 25 mg, 25 mg, Oral, QODAY, Mansy, Jan A, MD   clopidogrel (PLAVIX) tablet 75 mg, 75 mg, Oral, Daily, Mansy, Jan A, MD, 75 mg at 07/13/21 0818   cyclobenzaprine (FLEXERIL) tablet 5 mg, 5 mg, Oral, TID PRN, Mansy, Jan A, MD   ferrous sulfate tablet 325 mg, 325 mg, Oral, Daily, Mansy, Jan A, MD, 325 mg at 07/13/21 0818   fesoterodine (TOVIAZ) tablet 4 mg, 4 mg, Oral, Daily, Mansy, Jan A, MD   flecainide [facility]) tablet 50 mg, 50 mg, Oral, BID, Mansy, Jan A, MD   gabapentin (NEURONTIN) capsule 100 mg, 100 mg, Oral, TID, Mansy, Jan A, MD, 100 mg at 07/13/21 [id number]   losartan (COZAAR) tablet 50 mg, 50 mg, Oral, Daily, Mansy, Jan A, MD, 50 mg at 07/13/21 [id number]   magnesium hydroxide (MILK OF MAGNESIA) suspension 30 mL, 30 mL, Oral, Daily PRN, Mansy, Jan A, MD   ondansetron (ZOFRAN) tablet 4 mg, 4 mg, Oral, Q6H PRN **OR** ondansetron (ZOFRAN) injection 4 mg, 4 mg, Intravenous, Q6H PRN, Mansy, Jan A, MD   pantoprazole (PROTONIX) EC tablet 40 mg, 40 mg, Oral, Daily, Mansy, Jan A, MD, 40 mg at 07/13/21 0818   traZODone (DESYREL) tablet 25 mg, 25 mg, Oral, QHS PRN, Mansy, Jan A, MD   vitamin B-12 (CYANOCOBALAMIN) tablet 100 mcg, 100 mcg, Oral, Daily, Mansy, Jan A, MD   zolpidem (AMBIEN) tablet 5 mg, 5 mg, Oral, QHS PRN, Mansy, Jan A, MD  Current Outpatient Medications:    aspirin 81 MG chewable tablet, Chew by mouth daily., Disp: , Rfl:    calcium-vitamin D (OSCAL) 250-125 MG-UNIT per tablet, Take 1 tablet by mouth 2 (two) times daily. , Disp: , Rfl:    chlorthalidone (HYGROTON) 25 MG tablet, Take 1 tablet (25 mg total) by mouth daily. (Patient taking differently: Take 25 mg by mouth every other day.), Disp: 90  tablet, Rfl: 3   Cyanocobalamin (VITAMIN B-12 PO), Take 1 tablet by mouth daily., Disp: , Rfl:    cyclobenzaprine (FLEXERIL) 10 MG tablet, Take 0.5 tablets (5 mg total) by mouth 2 (two) times daily as needed., Disp: 30 tablet, Rfl: 2   Ferrous Sulfate 134 MG TABS, Take 1 tablet by mouth daily., Disp: , Rfl:    flecainide (TAMBOCOR) 50 MG tablet, Take 1 tablet (50 mg total) by mouth 2 (two) times daily., Disp: 180 tablet, Rfl: 3   gabapentin (NEURONTIN) 100 MG capsule, Take 1 capsule (100 mg total) by mouth 3 (three) times daily., Disp: 270 capsule, Rfl: 4   Leuprolide  Acetate, 6 Month, (LUPRON) 45 MG injection, Inject 45 mg into the muscle every 6 (six) months., Disp: , Rfl:    losartan (COZAAR) 25 MG tablet, Take 50 mg by mouth daily., Disp: , Rfl:    omeprazole (PRILOSEC) 40 MG capsule, TAKE ONE CAPSULE DAILY. (Patient taking differently: Take 40 mg by mouth daily.), Disp: 90 capsule, Rfl: 4   Potassium 99 MG TABS, Take by mouth., Disp: , Rfl:    propranolol (INDERAL) 10 MG tablet, Take 1 tablet by mouth daily., Disp: , Rfl:    tolterodine (DETROL LA) 4 MG 24 hr capsule, Take 1 capsule (4 mg total) by mouth daily., Disp: 90 capsule, Rfl: 3   traMADol (ULTRAM) 50 MG tablet, Take 1 tablet (50 mg total) by mouth every 6 (six) hours as needed., Disp: 30 tablet, Rfl: 3   Vitamin A 2400 MCG (8000 UT) CAPS, Take by mouth., Disp: , Rfl:    zolpidem (AMBIEN) 10 MG tablet, TAKE ONE-HALF TO ONE TABLET AT BEDTIME., Disp: 30 tablet, Rfl: 3  Exam: Current vital signs: BP (!) 128/59   Pulse (!) 56   Temp 97.7 F (36.5 C) (Oral)   Resp 15   Ht '5\' 10"'$  (1.778 m)   Wt 74.8 kg   SpO2 96%   BMI 23.68 kg/m  Vital signs in last 24 hours: Temp:  [97.7 F (36.5 C)] 97.7 F (36.5 C) (08/27 1926) Pulse Rate:  [51-70] 56 (08/28 0800) Resp:  [11-20] 15 (08/28 0800) BP: (112-170)/(53-99) 128/59 (08/28 0800) SpO2:  [95 %-99 %] 96 % (08/28 0800) Weight:  [74.8 kg] 74.8 kg (08/27 1928) GENERAL: Awake, alert in  NAD HEENT: - Normocephalic and atraumatic, dry mm, no LN++, no Thyromegally LUNGS - Clear to auscultation bilaterally with no wheezes CV - S1S2 RRR, no m/r/g, equal pulses bilaterally. ABDOMEN - Soft, nontender, nondistended with normoactive BS Ext: warm, well perfused, intact peripheral pulses, no edema NEURO:  Mental Status: AA&Ox3  Language: speech is nondysarthric, mild hypophonia.  Naming, repetition, fluency, and comprehension intact. Cranial Nerves: PERRL. EOMI, visual fields full, no facial asymmetry,+hypomimia,  facial sensation intact, hearing intact, tongue/uvula/soft palate midline, normal sternocleidomastoid and trapezius muscle strength. No evidence of tongue atrophy or fibrillations Motor: Full and symmetric strength in all 4 extremities without vertical drift.  There is mild cogwheeling in the left upper extremity.  Slow finger taps bilaterally worse on the left. Tone: is normal and bulk is normal Sensation- Intact to light touch bilaterally Coordination: FTN-slow to perform with very mild dysmetria on the left Gait- deferred  NIHSS 1a Level of Conscious.: 0 1b LOC Questions: 0 1c LOC Commands: 0 2 Best Gaze: 0 3 Visual: 0 4 Facial Palsy: 0 5a Motor Arm - left: 0 5b Motor Arm - Right: 0 6a Motor Leg - Left: 0 6b Motor Leg - Right: 0 7 Limb Ataxia: 1 8 Sensory: 0 9 Best Language: 0 10 Dysarthria: 0 11 Extinct. and Inatten.: 0 TOTAL: 1  Labs I have reviewed labs in epic and the results pertinent to this consultation are: WBC count 1.9, hemoglobin 11, hematocrit 31.7, platelet count 38,000 Hyponatremia and hypokalemia  CBC    Component Value Date/Time   WBC 1.9 (L) 07/13/2021 0449   RBC 4.12 (L) 07/13/2021 0449   HGB 11.0 (L) 07/13/2021 0449   HGB 11.7 (L) 04/24/2021 1437   HCT 31.7 (L) 07/13/2021 0449   HCT 35.4 (L) 04/24/2021 1437   PLT 38 (L) 07/13/2021 0449   PLT 47 (LL)  04/24/2021 1437   MCV 76.9 (L) 07/13/2021 0449   MCV 76 (L) 04/24/2021 1437    MCV 83 08/18/2012 0108   MCH 26.7 07/13/2021 0449   MCHC 34.7 07/13/2021 0449   RDW 13.8 07/13/2021 0449   RDW 14.5 04/24/2021 1437   RDW 14.3 08/18/2012 0108   LYMPHSABS 0.3 (L) 07/12/2021 1932   LYMPHSABS 0.4 (L) 04/24/2021 1437   LYMPHSABS 1.1 08/18/2012 0108   MONOABS 0.2 07/12/2021 1932   MONOABS 0.7 08/18/2012 0108   EOSABS 0.1 07/12/2021 1932   EOSABS 0.1 04/24/2021 1437   EOSABS 0.1 08/18/2012 0108   BASOSABS 0.0 07/12/2021 1932   BASOSABS 0.0 04/24/2021 1437   BASOSABS 0.0 08/18/2012 0108    CMP - hyponatremia    Component Value Date/Time   NA 130 (L) 07/13/2021 0449   NA 134 04/24/2021 1437   NA 142 08/18/2012 0108   K 3.0 (L) 07/13/2021 0449   K 3.8 08/18/2012 0108   CL 95 (L) 07/13/2021 0449   CL 102 08/18/2012 0108   CO2 27 07/13/2021 0449   CO2 30 08/18/2012 0108   GLUCOSE 98 07/13/2021 0449   GLUCOSE 93 08/18/2012 0108   BUN 10 07/13/2021 0449   BUN 7 (L) 04/24/2021 1437   BUN 16 08/18/2012 0108   CREATININE 0.61 07/13/2021 0449   CREATININE 1.36 (H) 08/18/2012 0108   CALCIUM 8.9 07/13/2021 0449   CALCIUM 8.2 (L) 08/18/2012 0108   PROT 7.1 07/12/2021 1932   PROT 7.1 04/24/2021 1437   PROT 6.6 08/18/2012 0108   ALBUMIN 4.1 07/12/2021 1932   ALBUMIN 4.7 04/24/2021 1437   ALBUMIN 3.4 08/18/2012 0108   AST 28 07/12/2021 1932   AST 37 08/18/2012 0108   ALT 13 07/12/2021 1932   ALT 37 08/18/2012 0108   ALKPHOS 68 07/12/2021 1932   ALKPHOS 73 08/18/2012 0108   BILITOT 2.4 (H) 07/12/2021 1932   BILITOT 0.8 04/24/2021 1437   BILITOT 1.4 (H) 08/18/2012 0108   GFRNONAA >60 07/13/2021 0449   GFRNONAA 52 (L) 08/18/2012 0108   GFRAA 98 08/19/2020 1508   GFRAA >60 08/18/2012 0108    Lipid Panel     Component Value Date/Time   CHOL 124 07/13/2021 0449   CHOL 113 05/16/2019 1020   TRIG 104 07/13/2021 0449   HDL 32 (L) 07/13/2021 0449   HDL 26 (L) 05/16/2019 1020   CHOLHDL 3.9 07/13/2021 0449   VLDL 21 07/13/2021 0449   LDLCALC 71 07/13/2021 0449    LDLCALC 41 05/16/2019 1020  LDL 71 A1c - 5.1   Carotid Dopplers: Chronic bilateral cervical carotid artery atherosclerosis.  Doppler ultrasound parameters indicate 50% or less carotid stenosis bilaterally.  Imaging I have reviewed the images obtained: MRI examination of the brain-no acute stroke.  Underlying mild chronic microvascular ischemic disease with few scattered remote and lacunar infarctions involving bilateral basal ganglia, right thalamus and corona radiata.  Additional small remote cortical/subcortical infarct involving the high posterior parasagittal right frontal parietal region.  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 The right-sided weakness could very well reflect a left hemispheric dysfunction (TIA) but his overall picture is more consistent with a systemic process which is causing generalized weakness and dizziness-that he  describes as lightheadedness.    He has pancytopenia with severe thrombocytopenia and absolute leukopenia along with anemia.  He is also on propranolol-for essential tremor-which could be contributing to both the generalized weakness, lightheadedness as well as the bradycardia that he is having at the moment.  Hyponatremia was also present on arrival.  With the level of thrombocytopenia, I am not sure if even aspirin is safe for him.  Recommend discussion with hematology  He would need to continue to follow with his outpatient neurologist, cardiologist and hematologist for the pancytopenia, ongoing chronic dizziness as well as atrial  fibrillation.  Impression: -Dizziness/lightheadedness-do not feel this is BPPV or TIA-more likely related to pancytopenia as well as side effect of propranolol and bradycardia as well as hyponatremia. -Other explanation for dizziness/possible autonomic instability in the setting of underlying parkinsonism -Transient right-sided weakness-could have been a TIA  Recommendations: -MRA head without contrast -As for stroke prevention-patient with atrial fibrillation history of stroke should be on anticoagulation-that said with his severe thrombocytopenia as well as history of bilateral subdurals-anticoagulation is off the table for now.  As for antiplatelets, even that is something that be extremely cautious and I would absolutely not do dual antiplatelets and him with that platelet count.  I would recommend discussion of aspirin use also with hematology -Management of hyponatremia per primary team as you are. -Orthostatic vitals -His LDL is nearly at goal at 71.  Goal less than 70.  I would start him on a low-dose statin but he has allergy (dizziness and weakness with statins) - and currently almost at goal and continues problems with dizziness - will hold off using statin -A1c at goal -I would also recommend looking at his home medication list and medications such as Flexeril and Ambien should also be avoided -PT OT ST -PT to perform Dix-Hallpike and assess for need for Epley in the off chance this is  BPPV -follow up Echo  He would need to continue to follow with his outpatient neurologist, cardiologist and hematologist for the pancytopenia, ongoing chronic dizziness as well as atrial fibrillation.  I will follow-up the MRA head with you  Amie [place], MD Neurologist [facility] Pager: [phone]

## 2021-07-13 NOTE — Evaluation (Signed)
Occupational Therapy Evaluation Patient Details Name: Ian Sanders MRN: [id number] DOB: 08-12-1941 Today's Date: 07/13/2021    History of Present Illness 80 y.o. Caucasian male with medical history significant for atrial fibrillation, TIA and CVA, hypertension, benign positional vertigo, hypothyroidism and previous history of falls, who presented to the ER with acute onset of right-sided numbness and weakness as well as severe dizziness and associated vertigo with inability to ambulate. MRI of the brain did not show acute stroke.  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   Clinical Impression   Pt seen for OT evaluation this date. Pt was independent in all ADLs and functional mobility, living in a 1-story home with wife. Pt currently requires MIN A for LB dressing, MIN GUARD for standing grooming tasks, and MIN GUARD for functional mobility of short household distances (~18f) due to dizziness, and decreased balance, activity tolerance, and strength. Of note, strength and sensation appear to be symmetrical in UE/LE. Pt educated on energy conservation strategies including seated rest breaks and pacing; pt and wife verbalized understanding. Pt would benefit from additional skilled OT services to maximize return to PLOF and minimize risk of future falls, injury, caregiver burden, and readmission. Upon discharge, recommend [facility].        Follow Up Recommendations  Home health OT;Supervision - Intermittent    Equipment Recommendations  None recommended by OT       Precautions / Restrictions Precautions Precautions: Fall Restrictions Weight Bearing Restrictions: No      Mobility Bed Mobility Overal bed mobility: Modified Independent             General bed mobility comments: Able to perform with increased time and HOB elevated    Transfers Overall transfer level:  Needs assistance   Transfers: Sit to/from Stand Sit to Stand: Min guard;From elevated surface         General transfer comment: Requires verbal cues for safe hand placement with RW use    Balance Overall balance assessment: Needs assistance   Sitting balance-Leahy Scale: Good Sitting balance - Comments: Supervision sitting EOB reaching within BOS   Standing balance support: No upper extremity supported;During functional activity Standing balance-Leahy Scale: Fair Standing balance comment: MIN GUARD reach outside BOS to wash hands                           ADL either performed or assessed with clinical judgement   ADL Overall ADL's : Needs assistance/impaired     Grooming: Wash/dry hands;Min guard;Standing               Lower Body Dressing: Minimal assistance;Sit to/from stand Lower Body Dressing Details (indicate cue type and reason): to don shorts. MIN A to thread through L LE             Functional mobility during ADLs: Min guard;Rolling walker       Vision Patient Visual Report: Other (comment) (Dizziness but denies any blurred or double vision)              Pertinent Vitals/Pain Pain Assessment: 0-10 Pain Score: 2  Pain Location: bottom of feet Pain Descriptors / Indicators: Burning Pain Intervention(s): Monitored during session;Limited activity within patient's tolerance     Hand Dominance     Extremity/Trunk Assessment Upper Extremity Assessment Upper Extremity Assessment: Generalized weakness   Lower Extremity Assessment Lower Extremity  Assessment: Generalized weakness       Communication Communication Communication: No difficulties   Cognition Arousal/Alertness: Awake/alert Behavior During Therapy: WFL for tasks assessed/performed Overall Cognitive Status: Within Functional Limits for tasks assessed                                                Home Living Family/patient expects to be discharged  to:: Private residence Living Arrangements: Spouse/significant other Available Help at Discharge: Family Type of Home: House Home Access: Stairs to enter [employer] of Steps: 2-3 steps   Home Layout: One level     Bathroom Shower/Tub: Tub/shower unit                    Prior Functioning/Environment Level of Independence: Independent        Comments: Independent with ADLs. Wife assists with some IADLs. MOD-I with walker for functional mobility        OT Problem List: Decreased strength;Decreased activity tolerance;Impaired balance (sitting and/or standing)      OT Treatment/Interventions: Self-care/ADL training;Therapeutic exercise;Energy conservation;DME and/or AE instruction;Therapeutic activities;Patient/family education;Balance training    OT Goals(Current goals can be found in the care plan section) Acute Rehab OT Goals Patient Stated Goal: to go home OT Goal Formulation: With patient Time For Goal Achievement: 07/27/21 Potential to Achieve Goals: Good  OT Frequency: Min 1X/week    AM-PAC OT "6 Clicks" Daily Activity     Outcome Measure Help from another person eating meals?: None Help from another person taking care of personal grooming?: A Little Help from another person toileting, which includes using toliet, bedpan, or urinal?: A Little Help from another person bathing (including washing, rinsing, drying)?: A Little Help from another person to put on and taking off regular upper body clothing?: None Help from another person to put on and taking off regular lower body clothing?: A Little 6 Click Score: 20   End of Session Equipment Utilized During Treatment: Rolling walker Nurse Communication: Mobility status  Activity Tolerance: Patient tolerated treatment well Patient left: in bed;with call bell/phone within reach;with family/visitor present  OT Visit Diagnosis: Unsteadiness on feet (R26.81);Muscle weakness (generalized) (M62.81)                 Time: 1351-1406 OT Time Calculation (min): 15 min Charges:  OT General Charges $OT Visit: 1 Visit OT Evaluation $OT Eval Moderate Complexity: [place], OTR/L [place]

## 2021-07-13 NOTE — ED Provider Notes (Signed)
[facility] Emergency Department Provider Note   ____________________________________________   Event Date/Time   First MD Initiated Contact with Patient 07/13/21 0000     (approximate)  I have reviewed the triage vital signs and the nursing notes.   HISTORY  Chief Complaint Weakness    HPI Ian Sanders is a 80 y.o. male with past medical history of hypertension, hyperlipidemia, stroke, and atrial fibrillation who presents to the ED for weakness.  Patient reports that he woke up this morning around 10:30 AM and noticed some numbness and weakness on his right side along with significant dizziness.  Symptoms have persisted throughout the day today and wife was concerned that his speech was slower than usual.  Patient states the weakness and numbness in his leg is severe enough that he is unable to walk.  He denies any history of similar symptoms in the past, does have a history of atrial fibrillation but is not anticoagulated due to multiple falls in the past.  He denies any fevers, cough, chest pain, shortness of breath, dysuria, or hematuria.        Past Medical History:  Diagnosis Date   Atrial fibrillation ([place])    Basal cell carcinoma    Benign paroxysmal positional vertigo    Cancer ([place])    History of chicken pox    History of measles    History of mumps    Hypertension    Mononeuritis of unspecified site    Other seborrheic keratosis    Stroke ([place]) 06/2020   Transient ischemic attack (TIA), and cerebral infarction without residual deficits(V12.54)    Traumatic subdural hematoma without loss of consciousness ([place]) 06/25/2020   Unspecified hypothyroidism     Patient Active Problem List   Diagnosis Date Noted   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   Alcohol abuse 12/23/2020   Other dyspnea and respiratory abnormality 12/23/2020   Heartburn 12/23/2020   Prostate cancer ([place]) 09/12/2020   Heart murmur, systolic 99991111    Pancytopenia ([place]) 07/17/2020   Splenomegaly 07/17/2020   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   Bradycardia 06/11/2020   Heart palpitations 02/13/2020   Cirrhosis of liver without ascites ([place]) 09/16/2019   Iron deficiency anemia due to chronic blood loss 09/16/2019   Chronic anticoagulation 09/16/2019   History of prostate cancer 09/16/2019   Thrombocytopenia ([place]) 06/05/2019   History of adenomatous polyp of colon 05/02/2018   Paroxysmal atrial fibrillation ([place]) 07/14/2017   Cornu cutaneum 06/29/2016   Laceration of ankle 06/29/2016   LBP (low back pain) 06/29/2016   Aortic atherosclerosis ([place]) 05/05/2016   Fatty liver 05/05/2016   Depression 04/17/2016   Regular alcohol consumption 04/17/2016   Neuropathy 04/16/2016   Hernia, inguinal, right 04/16/2016   Melena 04/16/2016   Elevated liver function tests 04/16/2016   Tobacco abuse 04/16/2016   Urge incontinence 07/17/2015   Incontinence 06/20/2015   Prediabetes 12/19/2013   Groin pain 02/21/2013   Hypertriglyceridemia 12/14/2011   HTN (hypertension) 12/14/2011   Hypothyroidism 05/13/2010   Sacroiliac sprain 04/28/2010   History of CVA (cerebrovascular accident) 03/21/2010   Headache 03/10/2010   Benign paroxysmal positional vertigo 01/15/2010   Insomnia 05/09/2009   Allergic rhinitis 05/09/2009   Esophageal reflux 05/09/2009   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/2009    Past Surgical History:  Procedure Laterality Date   Carotid Doppler Ultrasound  04/02/2010   Small amount Calcified plaque bilaterally, no significant stenosis.   CATARACT EXTRACTION W/PHACO Right 04/15/2021   Procedure: CATARACT EXTRACTION  PHACO AND INTRAOCULAR LENS PLACEMENT (IOC) RIGHT;  Surgeon: Birder Robson, MD;  Location: [place];  Service: Ophthalmology;  Laterality: Right;  7.98 0:47.2   CATARACT EXTRACTION W/PHACO Left 05/20/2021   Procedure: CATARACT EXTRACTION PHACO AND INTRAOCULAR LENS PLACEMENT (IOC) LEFT 3.37 00:32.0;  Surgeon: Birder Robson, MD;  Location: [place];  Service: Ophthalmology;  Laterality: Left;  Requests arrival 10A or after   COLONOSCOPY  05/11/2014   Tubular Adenoma. Dr. Allen Norris   COLONOSCOPY WITH PROPOFOL N/A 05/23/2018   Procedure: COLONOSCOPY WITH PROPOFOL;  Surgeon: [name], MD;  Location: [facility] ENDOSCOPY;  Service: Gastroenterology;  Laterality: N/A;   DOPPLER ECHOCARDIOGRAPHY  04/02/2010   Mild left atrial dilation. Normal right atrium. No valvular disease. No thrombus. Normal LV function. EF>55%   ESOPHAGOGASTRODUODENOSCOPY (EGD) WITH PROPOFOL N/A 08/16/2019   Procedure: ESOPHAGOGASTRODUODENOSCOPY (EGD) WITH PROPOFOL;  Surgeon: [name], MD;  Location: [facility] ENDOSCOPY;  Service: Gastroenterology;  Laterality: N/A;   FINGER AMPUTATION     HERNIA REPAIR     MRI Brain with and without contrast  03/20/2010   Suggestive of basal ganglia lacunar infarct   PROSTATECTOMY  1995   Abdominal   SKIN SURGERY     multiple   Thumb surgery      Prior to Admission medications   Medication Sig Start Date End Date Taking? Authorizing Provider  aspirin 81 MG chewable tablet Chew by mouth daily.    [provider]  calcium-vitamin D (OSCAL) 250-125 MG-UNIT per tablet Take 1 tablet by mouth 2 (two) times daily.     [provider]  chlorthalidone (HYGROTON) 25 MG tablet Take 1 tablet (25 mg total) by mouth daily. Patient taking differently: Take 25 mg by mouth every other day. 07/15/17   Minna Merritts, MD  Cyanocobalamin (VITAMIN B-12 PO) Take by mouth daily.    [provider]  cyclobenzaprine (FLEXERIL) 10 MG tablet Take 0.5 tablets (5 mg total) by mouth 2 (two) times daily as needed. 09/26/20   Birdie Sons, MD  Ferrous Sulfate 134 MG TABS Take 1 tablet by mouth daily.    [provider]  flecainide (TAMBOCOR) 50 MG tablet Take 1 tablet (50 mg total) by mouth 2 (two) times daily. 07/15/17   Minna Merritts, MD  gabapentin (NEURONTIN) 100 MG capsule Take 1  capsule (100 mg total) by mouth 3 (three) times daily. 04/15/21   Birdie Sons, MD  Leuprolide Acetate, 6 Month, (LUPRON) 45 MG injection Inject 45 mg into the muscle every 6 (six) months.    [provider]  losartan (COZAAR) 25 MG tablet Take 50 mg by mouth daily. 08/20/20 08/20/21  [provider]  omeprazole (PRILOSEC) 40 MG capsule TAKE ONE CAPSULE DAILY. Patient taking differently: Take 40 mg by mouth daily. 06/06/18   Birdie Sons, MD  Potassium 99 MG TABS Take by mouth.    [provider]  tolterodine (DETROL LA) 4 MG 24 hr capsule Take 1 capsule (4 mg total) by mouth daily. 07/03/21   McGowan, Larene Beach A, PA-C  traMADol (ULTRAM) 50 MG tablet Take 1 tablet (50 mg total) by mouth every 6 (six) hours as needed. 03/07/21   Birdie Sons, MD  Vitamin A 2400 MCG (8000 UT) CAPS Take by mouth.    [provider]  zolpidem (AMBIEN) 10 MG tablet TAKE ONE-HALF TO ONE TABLET AT BEDTIME. 04/22/21   Birdie Sons, MD    Allergies Lisinopril, Myrbetriq [mirabegron], Other, Triamterene, Amlodipine,  Atorvastatin, Dabigatran etexilate mesylate, and Dabigatran etexilate mesylate  Family History  Problem Relation Age of Onset   Heart disease Mother 92       CABG   Hypertension Mother    Heart attack Mother    Hypertension Brother    Heart disease Sister        stents   Prostate cancer Brother    Kidney cancer Brother    Skin cancer Brother    Hypertension Brother    Bladder Cancer Sister    Leukemia Father    Stroke Father        multiple   Cerebral aneurysm Sister    Kidney cancer Brother     Social History Social History   Tobacco Use   Smoking status: Former    Packs/day: 0.00    Years: 12.00    Pack years: 0.00    Types: Cigarettes   Smokeless tobacco: Current    Types: Chew   Tobacco comments:    quit over 45 years ago  Vaping Use   Vaping Use: Never used  Substance Use Topics   Alcohol use: Not Currently    Alcohol/week: 5.0  standard drinks    Types: 5 Cans of beer per week    Comment: quit drinking summer 2021   Drug use: No    Review of Systems  Constitutional: No fever/chills Eyes: No visual changes. ENT: No sore throat. Cardiovascular: Denies chest pain. Respiratory: Denies shortness of breath. Gastrointestinal: No abdominal pain.  No nausea, no vomiting.  No diarrhea.  No constipation. Genitourinary: Negative for dysuria. Musculoskeletal: Negative for back pain. Skin: Negative for rash. Neurological: Negative for headaches, positive for right-sided numbness and weakness.  Positive for dizziness.  ____________________________________________   PHYSICAL EXAM:  VITAL SIGNS: ED Triage Vitals  Enc Vitals Group     BP 07/12/21 1926 (!) 170/75     Pulse Rate 07/12/21 1926 70     Resp 07/12/21 1926 20     Temp 07/12/21 1926 97.7 F (36.5 C)     Temp Source 07/12/21 1926 Oral     SpO2 07/12/21 1926 95 %     Weight 07/12/21 1928 165 lb (74.8 kg)     Height 07/12/21 1928 '5\' 10"'$  (1.778 m)     Head Circumference --      Peak Flow --      Pain Score 07/12/21 1927 0     Pain Loc --      Pain Edu? --      Excl. in [place]? --     Constitutional: Alert and oriented. Eyes: Conjunctivae are normal. Head: Atraumatic. Nose: No congestion/rhinnorhea. Mouth/Throat: Mucous membranes are moist. Neck: Normal ROM Cardiovascular: Normal rate, regular rhythm. Grossly normal heart sounds.  2+ radial pulses bilaterally. Respiratory: Normal respiratory effort.  No retractions. Lungs CTAB. Gastrointestinal: Soft and nontender. No distention. Genitourinary: deferred Musculoskeletal: No lower extremity tenderness nor edema. Neurologic:  Normal speech and language.  4+ out of 5 strength in right upper and lower extremities, 5 out of 5 strength in left upper and lower extremities.  No facial droop noted. Skin:  Skin is warm, dry and intact. No rash noted. Psychiatric: Mood and affect are normal. Speech and behavior  are normal.  ____________________________________________   LABS (all labs ordered are listed, but only abnormal results are displayed)  Labs Reviewed  PROTIME-INR - Abnormal; Notable for the following components:      Result Value   Prothrombin Time 16.2 (*)  INR 1.3 (*)    All other components within normal limits  CBC - Abnormal; Notable for the following components:   WBC 3.5 (*)    Hemoglobin 12.4 (*)    HCT 35.3 (*)    MCV 77.8 (*)    Platelets 50 (*)    All other components within normal limits  DIFFERENTIAL - Abnormal; Notable for the following components:   Lymphs Abs 0.3 (*)    All other components within normal limits  COMPREHENSIVE METABOLIC PANEL - Abnormal; Notable for the following components:   Sodium 128 (*)    Chloride 92 (*)    Glucose, Bld 124 (*)    Total Bilirubin 2.4 (*)    All other components within normal limits  CBG MONITORING, ED - Abnormal; Notable for the following components:   Glucose-Capillary 127 (*)    All other components within normal limits  RESP PANEL BY RT-PCR (FLU A&B, COVID) ARPGX2  APTT  URINALYSIS, COMPLETE (UACMP) WITH MICROSCOPIC   ____________________________________________  EKG  ED ECG REPORT I, Blake Divine, the attending physician, personally viewed and interpreted this ECG.   Date: 07/13/2021  EKG Time: 19:28  Rate: 66  Rhythm: normal sinus rhythm  Axis: Normal  Intervals:none  ST&T Change: None   PROCEDURES  Procedure(s) performed (including Critical Care):  Procedures   ____________________________________________   INITIAL IMPRESSION / ASSESSMENT AND PLAN / ED COURSE      80 year old male with past medical history of hypertension, hyperlipidemia, stroke, and atrial fibrillation not currently on anticoagulation who presents to the ED complaining of dizziness and right-sided weakness upon waking this morning at 10:30 AM.  He is outside the window for tPA and exam does not appear consistent with  large vessel occlusion.  CT head reviewed by me and shows no hemorrhage, negative for acute process per radiology.  Labs are remarkable for mild hyponatremia but otherwise reassuring.  Patient would benefit from admission for stroke work-up, especially given he has high risk with his atrial fibrillation history.  Plan to discuss with hospitalist for admission.      ____________________________________________   FINAL CLINICAL IMPRESSION(S) / ED DIAGNOSES  Final diagnoses:  Right sided weakness  Dizziness  Hyponatremia     ED Discharge Orders     None        Note:  This document was prepared using Dragon voice recognition software and may include unintentional dictation errors.    Blake Divine, MD 07/13/21 [phone]

## 2021-07-13 NOTE — H&P (Signed)
[place]   PATIENT NAME: Ian Sanders    MR#:  [id number]  DATE OF BIRTH:  Jun 07, 1941  DATE OF ADMISSION:  07/12/2021  PRIMARY CARE PHYSICIAN: Birdie Sons, MD   Patient is coming from: Home  REQUESTING/REFERRING PHYSICIAN: Blake Divine, MD  CHIEF COMPLAINT:   Chief Complaint  Patient presents with   Weakness    HISTORY OF PRESENT ILLNESS:  Ian Sanders is a 80 y.o. Caucasian male with medical history significant for atrial fibrillation, TIA and CVA, hypertension, benign positional vertigo, hypothyroidism and previous history of falls for which he he was not deemed a candidate for anticoagulation with his atrial fibrillation, who presented to the ER with acute onset of right-sided numbness and weakness as well as severe dizziness and associated vertigo with inability to ambulate secondarily.  He was noted by the ER physician to have right upper extremity drift and leaning to the right side.  He denied any facial weakness or dysphagia or dysarthria.  No chest pain or palpitations.  No cough or wheezing or dyspnea.  No nausea vomiting or diarrhea.  No urinary or stool incontinence.  No witnessed seizures.    ED Course: Upon presentation to the ER, BP was 160/76 and otherwise vital signs were within normal.  Labs revealed hyponatremia 128 and hypochloremia of 92 with potassium of 3.6 and total bili of 2.4.  CBC showed mild leukopenia of 3.5 and anemia with hemoglobin of 12.4 hematocrit 35.3 and thrombocytopenia with platelets of 50.  His numbers are actually better than June of this year.  INR was 1.3 and PT 16.2.  Influenza antigens and COVID-19 PCR came back negative.UA was unremarkable. EKG as reviewed by me : Showed sinus rhythm with a rate of 66 with T wave inversion inferiorly and in V1 Imaging: 2 view chest x-ray showed no acute cardiopulmonary disease.  Patient will be admitted to an observation medical monitored bed for further evaluation and management. PAST  MEDICAL HISTORY:   Past Medical History:  Diagnosis Date   Atrial fibrillation (HCC)    Basal cell carcinoma    Benign paroxysmal positional vertigo    Cancer ([place])    History of chicken pox    History of measles    History of mumps    Hypertension    Mononeuritis of unspecified site    Other seborrheic keratosis    Stroke ([place]) 06/2020   Transient ischemic attack (TIA), and cerebral infarction without residual deficits(V12.54)    Traumatic subdural hematoma without loss of consciousness ([place]) 06/25/2020   Unspecified hypothyroidism     PAST SURGICAL HISTORY:   Past Surgical History:  Procedure Laterality Date   Carotid Doppler Ultrasound  04/02/2010   Small amount Calcified plaque bilaterally, no significant stenosis.   CATARACT EXTRACTION W/PHACO Right 04/15/2021   Procedure: CATARACT EXTRACTION PHACO AND INTRAOCULAR LENS PLACEMENT ([place]) RIGHT;  Surgeon: Birder Robson, MD;  Location: [place];  Service: Ophthalmology;  Laterality: Right;  7.98 0:47.2   CATARACT EXTRACTION W/PHACO Left 05/20/2021   Procedure: CATARACT EXTRACTION PHACO AND INTRAOCULAR LENS PLACEMENT ([facility]) LEFT 3.37 00:32.0;  Surgeon: Birder Robson, MD;  Location: [facility];  Service: Ophthalmology;  Laterality: Left;  Requests arrival 10A or after   COLONOSCOPY  05/11/2014   Tubular Adenoma. Dr. Allen Norris   COLONOSCOPY WITH PROPOFOL N/A 05/23/2018   Procedure: COLONOSCOPY WITH PROPOFOL;  Surgeon: [name], MD;  Location: [facility] ENDOSCOPY;  Service: Gastroenterology;  Laterality: N/A;   DOPPLER ECHOCARDIOGRAPHY  04/02/2010   Mild left atrial dilation. Normal right atrium. No valvular disease. No thrombus. Normal LV function. EF>55%   ESOPHAGOGASTRODUODENOSCOPY (EGD) WITH PROPOFOL N/A 08/16/2019   Procedure: ESOPHAGOGASTRODUODENOSCOPY (EGD) WITH PROPOFOL;  Surgeon: [name], MD;  Location: [facility] ENDOSCOPY;  Service: Gastroenterology;  Laterality: N/A;   FINGER AMPUTATION     HERNIA REPAIR      MRI Brain with and without contrast  03/20/2010   Suggestive of basal ganglia lacunar infarct   PROSTATECTOMY  1995   Abdominal   SKIN SURGERY     multiple   Thumb surgery      SOCIAL HISTORY:   Social History   Tobacco Use   Smoking status: Former    Packs/day: 0.00    Years: 12.00    Pack years: 0.00    Types: Cigarettes   Smokeless tobacco: Current    Types: Chew   Tobacco comments:    quit over 45 years ago  Substance Use Topics   Alcohol use: Not Currently    Alcohol/week: 5.0 standard drinks    Types: 5 Cans of beer per week    Comment: quit drinking summer 2021    FAMILY HISTORY:   Family History  Problem Relation Age of Onset   Heart disease Mother 27       CABG   Hypertension Mother    Heart attack Mother    Hypertension Brother    Heart disease Sister        stents   Prostate cancer Brother    Kidney cancer Brother    Skin cancer Brother    Hypertension Brother    Bladder Cancer Sister    Leukemia Father    Stroke Father        multiple   Cerebral aneurysm Sister    Kidney cancer Brother     DRUG ALLERGIES:   Allergies  Allergen Reactions   Lisinopril Swelling    Tongue swelling   Myrbetriq [Mirabegron] Swelling    Of the tongue   Other Other (See Comments)    Symptomatic bradycardia    Triamterene     Other reaction(s): ITCHING,WATERING EYES   Amlodipine Other (See Comments)    Peripheral edema   Atorvastatin Other (See Comments)    Dizziness and Fatigue   Dabigatran Etexilate Mesylate Other (See Comments)    Stomach ulcers (Pradaxa)   Dabigatran Etexilate Mesylate Other (See Comments)    Stomach ulcers (Pradaxa)    REVIEW OF SYSTEMS:   ROS As per history of present illness. All pertinent systems were reviewed above. Constitutional, HEENT, cardiovascular, respiratory, GI, GU, musculoskeletal, neuro, psychiatric, endocrine, integumentary and hematologic systems were reviewed and are otherwise negative/unremarkable except for  positive findings mentioned above in the HPI.   MEDICATIONS AT HOME:   Prior to Admission medications   Medication Sig Start Date End Date Taking? Authorizing Provider  aspirin 81 MG chewable tablet Chew by mouth daily.   Yes [provider]  calcium-vitamin D (OSCAL) 250-125 MG-UNIT per tablet Take 1 tablet by mouth 2 (two) times daily.    Yes [provider]  chlorthalidone (HYGROTON) 25 MG tablet Take 1 tablet (25 mg total) by mouth daily. Patient taking differently: Take 25 mg by mouth every other day. 07/15/17  Yes Gollan, Kathlene November, MD  Cyanocobalamin (VITAMIN B-12 PO) Take 1 tablet by mouth daily.   Yes [provider]  cyclobenzaprine (FLEXERIL) 10 MG tablet Take 0.5 tablets (5 mg total) by mouth 2 (two) times daily  as needed. 09/26/20  Yes Birdie Sons, MD  Ferrous Sulfate 134 MG TABS Take 1 tablet by mouth daily.   Yes [provider]  flecainide (TAMBOCOR) 50 MG tablet Take 1 tablet (50 mg total) by mouth 2 (two) times daily. 07/15/17  Yes Minna Merritts, MD  gabapentin (NEURONTIN) 100 MG capsule Take 1 capsule (100 mg total) by mouth 3 (three) times daily. 04/15/21  Yes Birdie Sons, MD  Leuprolide Acetate, 6 Month, (LUPRON) 45 MG injection Inject 45 mg into the muscle every 6 (six) months.   Yes [provider]  losartan (COZAAR) 25 MG tablet Take 50 mg by mouth daily. 08/20/20 08/20/21 Yes [provider]  omeprazole (PRILOSEC) 40 MG capsule TAKE ONE CAPSULE DAILY. Patient taking differently: Take 40 mg by mouth daily. 06/06/18  Yes Birdie Sons, MD  Potassium 99 MG TABS Take by mouth.   Yes [provider]  propranolol (INDERAL) 10 MG tablet Take 1 tablet by mouth daily. 07/09/21  Yes [provider]  tolterodine (DETROL LA) 4 MG 24 hr capsule Take 1 capsule (4 mg total) by mouth daily. 07/03/21  Yes McGowan, Larene Beach A, PA-C  traMADol (ULTRAM) 50 MG tablet Take 1 tablet (50 mg total) by mouth every 6  (six) hours as needed. 03/07/21  Yes Birdie Sons, MD  Vitamin A 2400 MCG (8000 UT) CAPS Take by mouth.   Yes [provider]  zolpidem (AMBIEN) 10 MG tablet TAKE ONE-HALF TO ONE TABLET AT BEDTIME. 04/22/21  Yes Birdie Sons, MD      VITAL SIGNS:  Blood pressure (!) 157/66, pulse 64, temperature 97.7 F (36.5 C), temperature source Oral, resp. rate 15, height '5\' 10"'$  (1.778 m), weight 74.8 kg, SpO2 99 %.  PHYSICAL EXAMINATION:  Physical Exam  GENERAL:  80 y.o.-year-old Caucasian male patient lying in the bed with no acute distress.  EYES: Pupils equal, round, reactive to light and accommodation. No scleral icterus. Extraocular muscles intact.  HEENT: Head atraumatic, normocephalic. Oropharynx and nasopharynx clear.  NECK:  Supple, no jugular venous distention. No thyroid enlargement, no tenderness.  LUNGS: Normal breath sounds bilaterally, no wheezing, rales,rhonchi or crepitation. No use of accessory muscles of respiration.  CARDIOVASCULAR: Regular rate and rhythm, S1, S2 normal. No murmurs, rubs, or gallops.  ABDOMEN: Soft, nondistended, nontender. Bowel sounds present. No organomegaly or mass.  EXTREMITIES: No pedal edema, cyanosis, or clubbing.  NEUROLOGIC: Cranial nerves II through XII are intact.  No facial droop.  No pronator drift.  Muscle strength 5/5 in all extremities. Sensation intact throughout except the right lower extremity.. Gait not checked.  PSYCHIATRIC: The patient is alert and oriented x 3.  Normal affect and good eye contact. SKIN: No obvious rash, lesion, or ulcer.   LABORATORY PANEL:   CBC Recent Labs  Lab 07/12/21 1932  WBC 3.5*  HGB 12.4*  HCT 35.3*  PLT 50*   ------------------------------------------------------------------------------------------------------------------  Chemistries  Recent Labs  Lab 07/12/21 1932  NA 128*  K 3.6  CL 92*  CO2 25  GLUCOSE 124*  BUN 10  CREATININE 0.72  CALCIUM 9.3  AST 28  ALT 13  ALKPHOS 68   BILITOT 2.4*   ------------------------------------------------------------------------------------------------------------------  Cardiac Enzymes No results for input(s): TROPONINI in the last 168 hours. ------------------------------------------------------------------------------------------------------------------  RADIOLOGY:  DG Chest 2 View  Result Date: 07/13/2021 CLINICAL DATA:  Weakness EXAM: CHEST - 2 VIEW COMPARISON:  None. FINDINGS: Lungs are clear.  No pleural effusion or pneumothorax. The  heart is normal in size.  Thoracic aortic atherosclerosis. Mild degenerative changes of the visualized thoracolumbar spine. IMPRESSION: Normal chest radiographs. Electronically Signed   By: Julian Hy M.D.   On: 07/13/2021 01:06   CT HEAD WO CONTRAST  Result Date: 07/12/2021 CLINICAL DATA:  Acute neuro deficit, stroke suspected.  Weakness. EXAM: CT HEAD WITHOUT CONTRAST TECHNIQUE: Contiguous axial images were obtained from the base of the skull through the vertex without intravenous contrast. COMPARISON:  Head CT dated 07/27/2019 FINDINGS: Brain: Ventricles are stable in size. Mild chronic small vessel ischemic changes noted within the deep periventricular white matter regions bilaterally. No mass, hemorrhage, edema or other evidence of acute parenchymal abnormality. No extra-axial hemorrhage. Vascular: Chronic calcified atherosclerotic changes of the large vessels at the skull base. No unexpected hyperdense vessel. Skull: Normal. Negative for fracture or focal lesion. Sinuses/Orbits: No acute finding. Other: None. IMPRESSION: 1. No acute findings. No intracranial mass, hemorrhage or edema. 2. Mild chronic small vessel ischemic changes in the white matter. Electronically Signed   By: Franki Cabot M.D.   On: 07/12/2021 19:58      IMPRESSION AND PLAN:  Active Problems:   CVA (cerebral vascular accident) ([place])  1.  Right-sided numbness and weakness, currently better, with associated  persistent vertigo and dizziness concerning for TIA, rule out evolving CVA. - The patient be admitted to an observation medical monitored bed. - We will follow neurochecks every 4 hours for 24 hours. - We will add Plavix to his aspirin. - Statin therapy will be provided. - Fasting lipids will be checked. - Brain MRI without contrast as well as bilateral carotid Doppler and 2D echo with bubble study will be obtained. - We will place the patient on as needed Antivert.  Differential diagnosis would include is benign positional vertigo could be related to inner ear etiology. - Neurology consult as well as PT/ST and OT consults will be obtained. -I notified Dr. Rory Percy about the patient.  2.  Mild hyponatremia and hypochloremia.  This is likely hypovolemic. - The patient will be hydrated with IV normal saline and will follow his BMP.  3.  Paroxysmal atrial fibrillation. - We will continue his flecainide.  4.  Essential hypertension. - We will continue his antihypertensives with permissive parameters.  5.  GERD. - PPI therapy will be resumed.  6.  Overactive bladder. - We will continue Detrol LA.  DVT prophylaxis: Lovenox. Code Status: full code. Family Communication:  The plan of care was discussed in details with the patient (and family). I answered all questions. The patient agreed to proceed with the above mentioned plan. Further management will depend upon hospital course. Disposition Plan: Back to previous home environment Consults called: Neurology.  All the records are reviewed and case discussed with ED provider.  Status is: Observation  The patient remains OBS appropriate and will d/c before 2 midnights.  Dispo: The patient is from: Home              Anticipated d/c is to: Home              Patient currently is not medically stable to d/c.   Difficult to place patient No   TOTAL TIME TAKING CARE OF THIS PATIENT: 55 minutes.    Christel Mormon M.D on 07/13/2021 at 1:56  AM  [facility]   From 7 PM-7 AM, contact night-coverage [email or web address]  CC: Primary care physician; Birdie Sons, MD

## 2021-07-13 NOTE — Discharge Summary (Addendum)
Physician Discharge Summary  Patient ID: Ian Sanders MRN: [id number] DOB/AGE: [birth year] 80 y.o.  Admit date: 07/12/2021 Discharge date: 07/13/2021  Admission Diagnoses:  Discharge Diagnoses:  Active Problems:   CVA (cerebral vascular accident) [facility])   Discharged Condition: fair  Hospital Course:  Ian Sanders is a 80 y.o. Caucasian male with medical history significant for atrial fibrillation, TIA and CVA, hypertension, benign positional vertigo, hypothyroidism and previous history of falls for which he he was not deemed a candidate for anticoagulation with his atrial fibrillation, who presented to the ER with acute onset of right-sided numbness and weakness as well as severe dizziness and associated vertigo with inability to ambulate secondarily. She is a seen by neurology, MRI of the brain did not show acute stroke.  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  Discussed with Dr. Rory Percy, patient conditions are more consistent with a TIA.  Patient had severe thrombocytopenia, not safe to treat with aspirin or Plavix.  Patient also has intolerance of statin. This point, decision made to discharge patient to follow-up with neurology as outpatient.  #1.  TIA. No additional treatment is indicated at this time.  Follow-up with neurology as outpatient.  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 Due to severe thrombocytopenia, antiplatelet is not indicated.  Patient will be followed with her oncologist-hematology and decide if patient can take aspirin.  I will hold it for now.  #3.  Paroxysmal atrial fibrillation. Continue flecainide.  Not a candidate for anticoagulation due to severe thrombocytopenia.  #4.  Hypokalemia  Repleted with 59mq IV KCl   Consults: neurology  Significant Diagnostic Studies:  Echo: Left ventricular  ejection fraction, by estimation, is 60 to 65%. The left ventricle has normal function. The left ventricle has no regional wall motion abnormalities. Left ventricular diastolic parameters are consistent with Grade I diastolic dysfunction (impaired relaxation). 1. Right ventricular systolic function is normal. The right ventricular size is normal. There is normal pulmonary artery systolic pressure. 2. 3. Left atrial size was mildly dilated. 4. Right atrial size was mildly dilated. The mitral valve is normal in structure. Mild to moderate mitral valve regurgitation. No evidence of mitral stenosis. 5. The aortic valve is normal in structure. Aortic valve regurgitation is moderate. Mild aortic valve sclerosis is present, with no evidence of aortic valve stenosis. 6. The inferior vena cava is normal in size with greater than 50% respiratory variability, suggesting right atrial pressure of 3 mmHg. 7. Agitated saline contrast bubble study was negative, with no evidence of any interatrial shunt.  MRA HEAD WITHOUT CONTRAST   TECHNIQUE: Angiographic images of the Circle of Willis were acquired using MRA technique without intravenous contrast.   COMPARISON:  Brain MRI 0153 hours today. Carotid Doppler ultrasound earlier today.   FINDINGS: Antegrade flow in the posterior circulation but absent flow signal in the distal right vertebral artery, which also demonstrated an abnormal flow void on the plain MRI earlier today. Distal left vertebral artery is patent and supplies the basilar with mild V4 segment irregularity and stenosis upstream of the patent left PICA origin. Retrograde supply to the right PICA is suspected.   Patent although diminutive basilar artery. The basilar functionally terminates at the ICAs with fetal type bilateral PCA origins. Tortuous posterior communicating arteries. Bilateral PCA branches are within normal limits.   Antegrade flow in both ICA siphons. 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 No apparent vessel stenosis. And both ICA siphons are patent to the carotid termini without stenosis. Ophthalmic and posterior communicating artery origins are within normal limits. Patent MCA and ACA origins. Dominant right A1 segment. Anterior communicating artery and visible ACA branches are within normal limits. Bilateral MCA M1 segments are tortuous. Bilateral MCA trifurcations are patent without stenosis. Visible bilateral MCA branches are within normal limits.   IMPRESSION: 1. 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 This is age indeterminate, but probably chronic given absence of acute MRI signal changes in the posterior fossa this morning.   - moderate stenosis of the distal Left Vertebral Artery which supplies the Basilar.   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   2. No other large vessel or circle-of-Willis branch occlusion identified.     Electronically Signed   By: Genevie Ann M.D.   On: 07/13/2021 11:57  MRI HEAD WITHOUT CONTRAST   TECHNIQUE: Multiplanar, multiecho pulse sequences of the brain and surrounding structures were obtained without intravenous contrast.   COMPARISON:  CT from 07/12/2021.   FINDINGS: Brain: Cerebral volume within normal limits for age. Mild chronic microvascular ischemic disease noted involving the periventricular white matter. Few scattered remote lacunar infarcts noted about the bilateral basal ganglia, right thalamus, and corona radiata. Small remote cortical/subcortical infarct at the high posterior parasagittal right frontoparietal region (series 15, image 46).   No abnormal foci of restricted diffusion to suggest acute or subacute ischemia. Gray-white matter  differentiation otherwise maintained. No acute or chronic intracranial hemorrhage.   No mass lesion or midline shift. No hydrocephalus or extra-axial fluid collection. Pituitary gland and suprasellar region normal. Midline structures intact.   Vascular: Major intracranial vascular flow voids are maintained.   Skull and upper cervical spine: Craniocervical junction within normal limits. Bone marrow signal intensity normal. No scalp soft tissue abnormality.   Sinuses/Orbits: Patient status post bilateral ocular lens replacement. Mild scattered mucosal thickening noted within the ethmoidal air cells. Paranasal sinuses are otherwise clear. Trace right mastoid effusion noted, of doubtful significance. Inner ear structures grossly normal.   Other: None.   IMPRESSION: 1. No acute intracranial abnormality. 2. Underlying mild chronic microvascular ischemic disease with a few scattered remote lacunar infarcts involving the bilateral basal ganglia, right thalamus, and corona radiata. Additional small remote cortical/subcortical infarct involving the high posterior parasagittal right frontoparietal region.     Electronically Signed   By: Jeannine Boga M.D.   On: 07/13/2021 03:03   Treatments: stroke workup  Discharge Exam: Blood pressure 135/61, pulse 61, temperature 97.7 F (36.5 C), temperature source Oral, resp. rate 15, height '5\' 10"'$  (1.778 m), weight 74.8 kg, SpO2 100 %. General appearance: alert and cooperative Resp: clear to auscultation bilaterally Cardio: regular rate and rhythm, S1, S2 normal, no murmur, click, rub or gallop GI: Abdomen seems to be distended, with shifting dullness and splenomegaly. Extremities: extremities normal, atraumatic, no cyanosis or edema  Disposition: Discharge disposition: 01-Home or Self Care       Discharge Instructions     Diet - low sodium heart healthy   Complete by: As directed    Increase activity slowly   Complete by:  As directed       Allergies as of  07/13/2021       Reactions   Lisinopril Swelling   Tongue swelling   Myrbetriq [mirabegron] Swelling   Of the tongue   Other Other (See Comments)   Symptomatic bradycardia    Triamterene    Other reaction(s): ITCHING,WATERING EYES   Amlodipine Other (See Comments)   Peripheral edema   Atorvastatin Other (See Comments)   Dizziness and Fatigue   Dabigatran Etexilate Mesylate Other (See Comments)   Stomach ulcers (Pradaxa)   Dabigatran Etexilate Mesylate Other (See Comments)   Stomach ulcers (Pradaxa)        Medication List     STOP taking these medications    aspirin 81 MG chewable tablet   propranolol 10 MG tablet Commonly known as: INDERAL       TAKE these medications    calcium-vitamin D 250-125 MG-UNIT tablet Commonly known as: OSCAL Take 1 tablet by mouth 2 (two) times daily.   chlorthalidone 25 MG tablet Commonly known as: HYGROTON Take 1 tablet (25 mg total) by mouth daily. What changed: when to take this   cyclobenzaprine 10 MG tablet Commonly known as: FLEXERIL Take 0.5 tablets (5 mg total) by mouth 2 (two) times daily as needed.   Ferrous Sulfate 134 MG Tabs Take 1 tablet by mouth daily.   flecainide 50 MG tablet Commonly known as: TAMBOCOR Take 1 tablet (50 mg total) by mouth 2 (two) times daily.   gabapentin 100 MG capsule Commonly known as: NEURONTIN Take 1 capsule (100 mg total) by mouth 3 (three) times daily.   Leuprolide Acetate (6 Month) 45 MG injection Commonly known as: LUPRON Inject 45 mg into the muscle every 6 (six) months.   losartan 25 MG tablet Commonly known as: COZAAR Take 50 mg by mouth daily.   omeprazole 40 MG capsule Commonly known as: PRILOSEC TAKE ONE CAPSULE DAILY.   Potassium 99 MG Tabs Take by mouth.   tolterodine 4 MG 24 hr capsule Commonly known as: DETROL LA Take 1 capsule (4 mg total) by mouth daily.   traMADol 50 MG tablet Commonly known as: ULTRAM Take 1  tablet (50 mg total) by mouth every 6 (six) hours as needed.   Vitamin A 2400 MCG (8000 UT) Caps Take by mouth.   VITAMIN B-12 PO Take 1 tablet by mouth daily.   zolpidem 10 MG tablet Commonly known as: AMBIEN TAKE ONE-HALF TO ONE TABLET AT BEDTIME.        Follow-up Information     Birdie Sons, MD Follow up in 1 week(s).   Specialty: Family Medicine Contact information: [address] [place] [address] [phone]         Cammie Sickle, MD Follow up in 1 week(s).   Specialties: Internal Medicine, Oncology Contact information: [place] [address] [phone]         [facility] Follow up in 2 week(s).   Contact information: [place] [phone]                Signed: Sharen Hones 07/13/2021, 1:31 PM

## 2021-07-13 NOTE — ED Notes (Signed)
Pt transported to MRI 

## 2021-07-13 NOTE — ED Notes (Signed)
Echo at bedside

## 2021-07-27 DIAGNOSIS — S51011A Laceration without foreign body of right elbow, initial encounter: Secondary | ICD-10-CM | POA: Diagnosis not present

## 2021-07-27 DIAGNOSIS — S01111A Laceration without foreign body of right eyelid and periocular area, initial encounter: Secondary | ICD-10-CM | POA: Diagnosis not present

## 2021-07-27 DIAGNOSIS — Z9181 History of falling: Secondary | ICD-10-CM | POA: Diagnosis not present

## 2021-07-27 DIAGNOSIS — R509 Fever, unspecified: Secondary | ICD-10-CM | POA: Diagnosis not present

## 2021-07-27 DIAGNOSIS — J029 Acute pharyngitis, unspecified: Secondary | ICD-10-CM | POA: Diagnosis not present

## 2021-07-27 DIAGNOSIS — W19XXXA Unspecified fall, initial encounter: Secondary | ICD-10-CM | POA: Diagnosis not present

## 2021-07-30 ENCOUNTER — Telehealth: Payer: Self-pay

## 2021-07-30 NOTE — Telephone Encounter (Signed)
I called and spoke with patient asking about his symptoms. He stated "the crisis has passed and he doesn't need assistance". I asked patient to clarify what he meant by that statement He advised me that he has already been seen at another clinic and is being treated.

## 2021-07-30 NOTE — Telephone Encounter (Signed)
Copied from [place] [phone]. Topic: General - Other >> Jul 30, 2021  1:21 PM Yvette Rack wrote: Reason for CRM: Pt reports that he did an at home Covid test and he tested positive for Covid and wanted to make Dr. Caryn Section aware.

## 2021-08-04 ENCOUNTER — Ambulatory Visit (INDEPENDENT_AMBULATORY_CARE_PROVIDER_SITE_OTHER): Payer: PPO | Admitting: Family Medicine

## 2021-08-04 DIAGNOSIS — E876 Hypokalemia: Secondary | ICD-10-CM

## 2021-08-04 DIAGNOSIS — R29898 Other symptoms and signs involving the musculoskeletal system: Secondary | ICD-10-CM | POA: Diagnosis not present

## 2021-08-04 DIAGNOSIS — D61818 Other pancytopenia: Secondary | ICD-10-CM | POA: Diagnosis not present

## 2021-08-04 DIAGNOSIS — E871 Hypo-osmolality and hyponatremia: Secondary | ICD-10-CM | POA: Diagnosis not present

## 2021-08-04 NOTE — Progress Notes (Signed)
Virtual telephone visit    Virtual Visit via Telephone Note   This visit type was conducted due to national recommendations for restrictions regarding the COVID-19 Pandemic (e.g. social distancing) in an effort to limit this patient's exposure and mitigate transmission in our community. Due to his co-morbid illnesses, this patient is at least at moderate risk for complications without adequate follow up. This format is felt to be most appropriate for this patient at this time. The patient did not have access to video technology or had technical difficulties with video requiring transitioning to audio format only (telephone). Physical exam was limited to content and character of the telephone converstion.    Patient location: home Provider location: [facility]   I discussed the limitations of evaluation and management by telemedicine and the availability of in person appointments. The patient expressed understanding and agreed to proceed.   Visit Date: 08/04/2021  Today's healthcare provider: Lelon Huh, MD   Chief Complaint  Patient presents with   Hospitalization Follow-up   Subjective    HPI  Follow up Hospitalization  Patient was admitted to [facility] on 07/12/2021 and discharged on 07/13/2021. He was treated for right sided weakness.  Telephone follow up was done on N/A He reports excellent compliance with treatment. He reports this condition is improved.  But pt states his legs still feel weak.   ----------------------------------------------------------------------------------------- - Pt would like to decrease gabapentin.     Medications: Outpatient Medications Prior to Visit  Medication Sig   calcium-vitamin D (OSCAL) 250-125 MG-UNIT per tablet Take 1 tablet by mouth 2 (two) times daily.    chlorthalidone (HYGROTON) 25 MG tablet Take 1 tablet (25 mg total) by mouth daily. (Patient taking differently: Take 25 mg by mouth every other day.)   Cyanocobalamin (VITAMIN B-12  PO) Take 1 tablet by mouth daily.   cyclobenzaprine (FLEXERIL) 10 MG tablet Take 0.5 tablets (5 mg total) by mouth 2 (two) times daily as needed.   Ferrous Sulfate 134 MG TABS Take 1 tablet by mouth daily.   flecainide (TAMBOCOR) 50 MG tablet Take 1 tablet (50 mg total) by mouth 2 (two) times daily.   gabapentin (NEURONTIN) 100 MG capsule Take 1 capsule (100 mg total) by mouth 3 (three) times daily.   Leuprolide Acetate, 6 Month, (LUPRON) 45 MG injection Inject 45 mg into the muscle every 6 (six) months.   losartan (COZAAR) 25 MG tablet Take 50 mg by mouth daily.   omeprazole (PRILOSEC) 40 MG capsule TAKE ONE CAPSULE DAILY. (Patient taking differently: Take 40 mg by mouth daily.)   Potassium 99 MG TABS Take by mouth.   tolterodine (DETROL LA) 4 MG 24 hr capsule Take 1 capsule (4 mg total) by mouth daily.   traMADol (ULTRAM) 50 MG tablet Take 1 tablet (50 mg total) by mouth every 6 (six) hours as needed.   Vitamin A 2400 MCG (8000 UT) CAPS Take by mouth.   zolpidem (AMBIEN) 10 MG tablet TAKE ONE-HALF TO ONE TABLET AT BEDTIME.   No facility-administered medications prior to visit.    Review of Systems  Constitutional: Negative.   HENT:  Positive for sore throat. Negative for congestion, dental problem, drooling, ear discharge, ear pain, facial swelling, hearing loss, mouth sores, nosebleeds, postnasal drip, rhinorrhea, sinus pressure, sinus pain, sneezing, tinnitus, trouble swallowing and voice change.   Respiratory: Negative.    Cardiovascular: Negative.   Gastrointestinal: Negative.   Neurological:  Positive for weakness (Pt states his legs feel weak.  He  states he has fallen once.), light-headedness and headaches. Negative for dizziness and speech difficulty.        Objective    There were no vitals taken for this visit.   Awake, alert, oriented x 3. In no apparent distress   Assessment & Plan     1. Pancytopenia ([place]) Due for follow up with oncology.  - Ambulatory referral to  Hematology / Oncology  2. Hyponatremia   3. Hypokalemia Advised we need to recheck electrolytes. He states he prefers to have Dr. Rogue Bussing check it at his follow up. Will check on status of referral later this week to make sure it is done in timely manner  4. Weakness of both lower limbs He feels like this may be due to gabapentin. He was advised to stop taking it during the day, and just take one at night. If not improving then he needs an in office visit for further evaluation.       I discussed the assessment and treatment plan with the patient. The patient was provided an opportunity to ask questions and all were answered. The patient agreed with the plan and demonstrated an understanding of the instructions.   The patient was advised to call back or seek an in-person evaluation if the symptoms worsen or if the condition fails to improve as anticipated.  I provided 10 minutes of non-face-to-face time during this encounter.  The entirety of the information documented in the History of Present Illness, Review of Systems and Physical Exam were personally obtained by me. Portions of this information were initially documented by the CMA and reviewed by me for thoroughness and accuracy.    Lelon Huh, MD [facility] [phone] (phone) [phone] (fax)  [place]

## 2021-08-06 DIAGNOSIS — S51011D Laceration without foreign body of right elbow, subsequent encounter: Secondary | ICD-10-CM | POA: Diagnosis not present

## 2021-08-06 DIAGNOSIS — W19XXXD Unspecified fall, subsequent encounter: Secondary | ICD-10-CM | POA: Diagnosis not present

## 2021-08-06 DIAGNOSIS — S01111D Laceration without foreign body of right eyelid and periocular area, subsequent encounter: Secondary | ICD-10-CM | POA: Diagnosis not present

## 2021-08-12 DIAGNOSIS — R42 Dizziness and giddiness: Secondary | ICD-10-CM | POA: Diagnosis not present

## 2021-08-12 DIAGNOSIS — I480 Paroxysmal atrial fibrillation: Secondary | ICD-10-CM | POA: Diagnosis not present

## 2021-08-12 DIAGNOSIS — G20 Parkinson's disease: Secondary | ICD-10-CM | POA: Diagnosis not present

## 2021-08-12 DIAGNOSIS — I729 Aneurysm of unspecified site: Secondary | ICD-10-CM | POA: Diagnosis not present

## 2021-08-12 DIAGNOSIS — Z8673 Personal history of transient ischemic attack (TIA), and cerebral infarction without residual deficits: Secondary | ICD-10-CM | POA: Diagnosis not present

## 2021-08-12 DIAGNOSIS — Z8679 Personal history of other diseases of the circulatory system: Secondary | ICD-10-CM | POA: Diagnosis not present

## 2021-08-20 ENCOUNTER — Other Ambulatory Visit: Payer: Self-pay | Admitting: Family Medicine

## 2021-08-20 NOTE — Telephone Encounter (Signed)
Requested medications are due for refill today.  yes  Requested medications are on the active medications list.  yes  Last refill. 04/22/2021  Future visit scheduled.   yes  Notes to clinic.  Medication not delegated.

## 2021-09-16 DIAGNOSIS — I720 Aneurysm of carotid artery: Secondary | ICD-10-CM | POA: Diagnosis not present

## 2021-09-16 DIAGNOSIS — I729 Aneurysm of unspecified site: Secondary | ICD-10-CM | POA: Diagnosis not present

## 2021-09-19 ENCOUNTER — Other Ambulatory Visit: Payer: Self-pay

## 2021-09-19 ENCOUNTER — Inpatient Hospital Stay (HOSPITAL_BASED_OUTPATIENT_CLINIC_OR_DEPARTMENT_OTHER): Payer: PPO | Admitting: Internal Medicine

## 2021-09-19 ENCOUNTER — Other Ambulatory Visit: Payer: Self-pay | Admitting: Family Medicine

## 2021-09-19 ENCOUNTER — Ambulatory Visit: Payer: Self-pay | Admitting: Family Medicine

## 2021-09-19 ENCOUNTER — Inpatient Hospital Stay: Payer: PPO | Attending: Internal Medicine

## 2021-09-19 VITALS — BP 144/54 | HR 54 | Temp 97.2°F | Resp 17 | Wt 165.2 lb

## 2021-09-19 DIAGNOSIS — D509 Iron deficiency anemia, unspecified: Secondary | ICD-10-CM | POA: Diagnosis not present

## 2021-09-19 DIAGNOSIS — K7460 Unspecified cirrhosis of liver: Secondary | ICD-10-CM | POA: Diagnosis not present

## 2021-09-19 DIAGNOSIS — D696 Thrombocytopenia, unspecified: Secondary | ICD-10-CM | POA: Insufficient documentation

## 2021-09-19 DIAGNOSIS — Z85828 Personal history of other malignant neoplasm of skin: Secondary | ICD-10-CM | POA: Diagnosis not present

## 2021-09-19 DIAGNOSIS — I10 Essential (primary) hypertension: Secondary | ICD-10-CM | POA: Diagnosis not present

## 2021-09-19 DIAGNOSIS — E039 Hypothyroidism, unspecified: Secondary | ICD-10-CM | POA: Diagnosis not present

## 2021-09-19 DIAGNOSIS — Z7982 Long term (current) use of aspirin: Secondary | ICD-10-CM | POA: Diagnosis not present

## 2021-09-19 DIAGNOSIS — M2550 Pain in unspecified joint: Secondary | ICD-10-CM | POA: Diagnosis not present

## 2021-09-19 DIAGNOSIS — C61 Malignant neoplasm of prostate: Secondary | ICD-10-CM | POA: Insufficient documentation

## 2021-09-19 DIAGNOSIS — M549 Dorsalgia, unspecified: Secondary | ICD-10-CM | POA: Diagnosis not present

## 2021-09-19 DIAGNOSIS — I4891 Unspecified atrial fibrillation: Secondary | ICD-10-CM | POA: Insufficient documentation

## 2021-09-19 DIAGNOSIS — Z8673 Personal history of transient ischemic attack (TIA), and cerebral infarction without residual deficits: Secondary | ICD-10-CM | POA: Diagnosis not present

## 2021-09-19 DIAGNOSIS — Z79899 Other long term (current) drug therapy: Secondary | ICD-10-CM | POA: Insufficient documentation

## 2021-09-19 LAB — CBC WITH DIFFERENTIAL/PLATELET
Abs Immature Granulocytes: 0.01 10*3/uL (ref 0.00–0.07)
Basophils Absolute: 0 10*3/uL (ref 0.0–0.1)
Basophils Relative: 0 %
Eosinophils Absolute: 0.1 10*3/uL (ref 0.0–0.5)
Eosinophils Relative: 5 %
HCT: 35.5 % — ABNORMAL LOW (ref 39.0–52.0)
Hemoglobin: 11.8 g/dL — ABNORMAL LOW (ref 13.0–17.0)
Immature Granulocytes: 0 %
Lymphocytes Relative: 17 %
Lymphs Abs: 0.4 10*3/uL — ABNORMAL LOW (ref 0.7–4.0)
MCH: 26.7 pg (ref 26.0–34.0)
MCHC: 33.2 g/dL (ref 30.0–36.0)
MCV: 80.3 fL (ref 80.0–100.0)
Monocytes Absolute: 0.2 10*3/uL (ref 0.1–1.0)
Monocytes Relative: 8 %
Neutro Abs: 1.8 10*3/uL (ref 1.7–7.7)
Neutrophils Relative %: 70 %
Platelets: 52 10*3/uL — ABNORMAL LOW (ref 150–400)
RBC: 4.42 MIL/uL (ref 4.22–5.81)
RDW: 14.1 % (ref 11.5–15.5)
WBC: 2.6 10*3/uL — ABNORMAL LOW (ref 4.0–10.5)
nRBC: 0 % (ref 0.0–0.2)

## 2021-09-19 LAB — COMPREHENSIVE METABOLIC PANEL
ALT: 15 U/L (ref 0–44)
AST: 30 U/L (ref 15–41)
Albumin: 4.2 g/dL (ref 3.5–5.0)
Alkaline Phosphatase: 86 U/L (ref 38–126)
Anion gap: 8 (ref 5–15)
BUN: 11 mg/dL (ref 8–23)
CO2: 28 mmol/L (ref 22–32)
Calcium: 8.7 mg/dL — ABNORMAL LOW (ref 8.9–10.3)
Chloride: 94 mmol/L — ABNORMAL LOW (ref 98–111)
Creatinine, Ser: 0.83 mg/dL (ref 0.61–1.24)
GFR, Estimated: >60 mL/min (ref >60)
Glucose, Bld: 99 mg/dL (ref 70–99)
Potassium: 3.4 mmol/L — ABNORMAL LOW (ref 3.5–5.1)
Sodium: 130 mmol/L — ABNORMAL LOW (ref 135–145)
Total Bilirubin: 1.5 mg/dL — ABNORMAL HIGH (ref 0.3–1.2)
Total Protein: 7.3 g/dL (ref 6.5–8.1)

## 2021-09-19 LAB — IRON AND TIBC
Iron: 86 ug/dL (ref 45–182)
Saturation Ratios: 21 % (ref 17.9–39.5)
TIBC: 416 ug/dL (ref 250–450)
UIBC: 330 ug/dL

## 2021-09-19 LAB — FERRITIN: Ferritin: 55 ng/mL (ref 24–336)

## 2021-09-19 NOTE — Progress Notes (Signed)
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 Vicente Males   Prostate cancer [facility]) (Resolved)  06/20/2015 Initial Diagnosis   Prostate cancer ([place])   Prostate cancer ([place])  09/12/2020 Initial Diagnosis   Prostate cancer ([place])      HISTORY OF PRESENTING ILLNESS: Alone.  Walks with a cane. [name] 80 y.o.  male pleasant patient with history of alcohol use /cirrhosis /A. Fib-he is here for follow-up.  Patient denies any blood in stools or black-colored stools.  Denies any nausea vomiting.  Denies any swelling in the legs.  No falls.  Continues to have joint pains back pain.   Review of Systems  Constitutional:  Positive for malaise/fatigue. Negative for chills, diaphoresis, fever and weight loss.  HENT:  Negative for nosebleeds and sore throat.   Eyes:  Negative for double vision.  Respiratory:  Negative for cough, hemoptysis, sputum production, shortness of breath and wheezing.   Cardiovascular:  Negative for chest pain, palpitations, orthopnea and leg swelling.  Gastrointestinal:  Negative for abdominal pain, blood in stool,  constipation, diarrhea, heartburn, melena, nausea and vomiting.  Genitourinary:  Negative for dysuria, frequency and urgency.  Musculoskeletal:  Positive for back pain and joint pain.  Skin: Negative.  Negative for itching and rash.  Neurological:  Negative for dizziness, tingling, focal weakness, weakness and headaches.  Endo/Heme/Allergies:  Does not bruise/bleed easily.  Psychiatric/Behavioral:  Negative for depression. The patient is not nervous/anxious and does not have insomnia.     MEDICAL HISTORY:  Past Medical History:  Diagnosis Date  . Atrial fibrillation ([place])   . Basal cell carcinoma   . Benign paroxysmal positional vertigo   . Cancer ([place])   . History of chicken pox   . History of measles   . History of mumps   . Hypertension   . Mononeuritis of unspecified site   . Other seborrheic keratosis   . Stroke ([place]) 06/2020  . 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 Traumatic subdural hematoma without loss of consciousness ([place]) 06/25/2020  . Unspecified hypothyroidism     SURGICAL HISTORY: Past Surgical History:  Procedure Laterality Date  . Carotid Doppler Ultrasound  04/02/2010   Small amount Calcified plaque bilaterally, no significant stenosis.  Marland Kitchen CATARACT EXTRACTION W/PHACO Right 04/15/2021   Procedure: CATARACT EXTRACTION PHACO AND INTRAOCULAR LENS PLACEMENT ([place]) RIGHT;  Surgeon: Birder Robson, MD;  Location: [place];  Service: Ophthalmology;  Laterality: Right;  7.98 0:47.2  . CATARACT EXTRACTION W/PHACO Left 05/20/2021   Procedure: CATARACT EXTRACTION PHACO AND INTRAOCULAR LENS PLACEMENT (IOC) LEFT 3.37 00:32.0;  Surgeon: Birder Robson, MD;  Location: [place];  Service: Ophthalmology;  Laterality: Left;  Requests arrival 10A or after  . COLONOSCOPY  05/11/2014   Tubular Adenoma. Dr. Allen Norris  .  COLONOSCOPY WITH PROPOFOL N/A 05/23/2018   Procedure: COLONOSCOPY WITH PROPOFOL;  Surgeon:  [name], MD;  Location: [facility] ENDOSCOPY;  Service: Gastroenterology;  Laterality: N/A;  . DOPPLER ECHOCARDIOGRAPHY  04/02/2010   Mild left atrial dilation. Normal right atrium. No valvular disease. No thrombus. Normal LV function. EF>55%  . ESOPHAGOGASTRODUODENOSCOPY (EGD) WITH PROPOFOL N/A 08/16/2019   Procedure: ESOPHAGOGASTRODUODENOSCOPY (EGD) WITH PROPOFOL;  Surgeon: [name], MD;  Location: [facility] ENDOSCOPY;  Service: Gastroenterology;  Laterality: N/A;  . FINGER AMPUTATION    . HERNIA REPAIR    . MRI Brain with and without contrast  03/20/2010   Suggestive of basal ganglia lacunar infarct  . PROSTATECTOMY  1995   Abdominal  . SKIN SURGERY     multiple  . Thumb surgery      SOCIAL HISTORY: Social History   Socioeconomic History  . Marital status: Married    Spouse name: Not on file  . Number of children: 2  . Years of education: Not on file  . Highest education level: Associate degree: occupational, Hotel manager, or vocational program  Occupational History  . Occupation: Retired  Tobacco Use  . Smoking status: Former    Packs/day: 0.00    Years: 12.00    Pack years: 0.00    Types: Cigarettes  . Smokeless tobacco: Current    Types: Chew  . Tobacco comments:    quit over 45 years ago  Vaping Use  . Vaping Use: Never used  Substance and Sexual Activity  . Alcohol use: Not Currently    Alcohol/week: 5.0 standard drinks    Types: 5 Cans of beer per week    Comment: quit drinking summer 2021  . Drug use: No  . Sexual activity: Not on file  Other Topics Concern  . 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   Social Determinants of Health   Financial Resource Strain: Not on file  Food Insecurity: Not on file  Transportation Needs: Not on file  Physical Activity: Not on file  Stress: Not on file  Social Connections: Not on file  Intimate Partner Violence:  Not on file    FAMILY HISTORY: Family History  Problem Relation Age of Onset  . Heart disease Mother 54       CABG  . Hypertension Mother   . Heart attack Mother   . Hypertension Brother   . Heart disease Sister        stents  . Prostate cancer Brother   . Kidney cancer Brother   . Skin cancer Brother   . Hypertension Brother   . Bladder Cancer Sister   . Leukemia Father   . Stroke Father        multiple  . Cerebral aneurysm Sister   . Kidney cancer Brother     ALLERGIES:  is allergic to lisinopril, myrbetriq [mirabegron], other, triamterene, amlodipine, atorvastatin, dabigatran etexilate mesylate, and dabigatran etexilate mesylate.  MEDICATIONS:  Current Outpatient Medications  Medication Sig Dispense Refill  . calcium-vitamin D (OSCAL) 250-125 MG-UNIT per tablet Take 1 tablet by mouth 2 (two) times daily.     . chlorthalidone (HYGROTON) 25 MG tablet Take 1 tablet (25 mg total) by mouth daily. 90 tablet 3  . Cyanocobalamin (VITAMIN B-12 PO) Take 1 tablet by mouth daily.    . cyclobenzaprine (FLEXERIL) 10 MG tablet Take 0.5 tablets (5 mg total) by mouth 2 (two) times  daily as needed. 30 tablet 2  . Ferrous Sulfate 134 MG TABS Take 1 tablet by mouth daily.    . flecainide (TAMBOCOR) 50 MG tablet Take 1 tablet (50 mg total) by mouth 2 (two) times daily. 180 tablet 3  . gabapentin (NEURONTIN) 100 MG capsule Take 1 capsule (100 mg total) by mouth 3 (three) times daily. 270 capsule 4  . Leuprolide Acetate, 6 Month, (LUPRON) 45 MG injection Inject 45 mg into the muscle every 6 (six) months.    Marland Kitchen omeprazole (PRILOSEC) 40 MG capsule TAKE ONE CAPSULE DAILY. (Patient taking differently: Take 40 mg by mouth daily.) 90 capsule 4  . Potassium 99 MG TABS Take by mouth.    . propranolol (INDERAL) 10 MG tablet Take 10 mg by mouth 2 (two) times daily.    Marland Kitchen tolterodine (DETROL LA) 4 MG 24 hr capsule Take 1 capsule (4 mg total) by mouth daily. 90 capsule 3  . traMADol (ULTRAM) 50 MG tablet  Take 1 tablet (50 mg total) by mouth every 6 (six) hours as needed. 30 tablet 3  . Vitamin A 2400 MCG (8000 UT) CAPS Take by mouth.    . zolpidem (AMBIEN) 10 MG tablet TAKE ONE-HALF TO ONE TABLET AT BEDTIME. 30 tablet 2  . losartan (COZAAR) 25 MG tablet Take 25 mg by mouth 2 (two) times daily.     No current facility-administered medications for this visit.     Marland Kitchen  PHYSICAL EXAMINATION:   Vitals:   09/19/21 1506  BP: (!) 144/54  Pulse: (!) 54  Resp: 17  Temp: (!) 97.2 F (36.2 C)  SpO2: 100%   Filed Weights   09/19/21 1506  Weight: 165 lb 3.2 oz (74.9 kg)    Physical Exam HENT:     Head: Normocephalic and atraumatic.     Mouth/Throat:     Pharynx: No oropharyngeal exudate.  Eyes:     Pupils: Pupils are equal, round, and reactive to light.  Cardiovascular:     Rate and Rhythm: Normal rate and regular rhythm.  Pulmonary:     Effort: No respiratory distress.     Breath sounds: No wheezing.  Abdominal:     General: Bowel sounds are normal. There is no distension.     Palpations: Abdomen is soft. There is no mass.     Tenderness: There is no abdominal tenderness. There is no guarding or rebound.     Comments: Positive for splenomegaly.  Musculoskeletal:        General: No tenderness. Normal range of motion.     Cervical back: Normal range of motion and neck supple.  Skin:    General: Skin is warm.  Neurological:     Mental Status: He is alert and oriented to person, place, and time.  Psychiatric:        Mood and Affect: Affect normal.     LABORATORY DATA:  I have reviewed the data as listed Lab Results  Component Value Date   WBC 2.6 (L) 09/19/2021   HGB 11.8 (L) 09/19/2021   HCT 35.5 (L) 09/19/2021   MCV 80.3 09/19/2021   PLT 52 (L) 09/19/2021   Recent Labs    04/24/21 1437 07/12/21 1932 07/13/21 0449 09/19/21 1433  NA 134 128* 130* 130*  K 3.8 3.6 3.0* 3.4*  CL 93* 92* 95* 94*  CO2 _0 GLUCOSE 103* 124* 98 99  BUN 7* _1 CREATININE 0.79 0.72 0.61 0.83  CALCIUM  9.0 9.3 8.9 8.7*  GFRNONAA  --  >60 >60 >60  PROT 7.1 7.1  --  7.3  ALBUMIN 4.7 4.1  --  4.2  AST 22 28  --  30  ALT 10 13  --  15  ALKPHOS 89 68  --  86  BILITOT 0.8 2.4*  --  1.5*     No results found.  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  November 2021 STABLE; bone marrow- NEG/reactive.  Continue surveillance.  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 iron intake.   #A. fib on on aspirin; Xarelto discontinued [recent fall intracranial bleed]- STABLE.   #Cirrhosis child Pugh a _0 CT scan April 2022-NEG- no evidence of any decompensation; discussed re: [place] screening; ultrasound in 6 months.  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  2021 PSA less than 0.1. STABLE.  DISPOSITION: # follow up in 6 months- MD; labs-cbc/cmp;AFP; iron studies/ferritin;US RUQ prior - Dr.B  Cc: Dr.Fisher-     Cammie Sickle, MD 09/19/2021 3:47 PM

## 2021-09-19 NOTE — Progress Notes (Signed)
Pt has no concerns/questions at this time.

## 2021-09-19 NOTE — Assessment & Plan Note (Addendum)
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  November 2021 STABLE; bone marrow- NEG/reactive.  Continue surveillance.  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 iron intake.   #A. fib on on aspirin; Xarelto discontinued [recent fall intracranial bleed]- STABLE.   #Cirrhosis child Pugh a [] CT scan April 2022-NEG- no evidence of any decompensation; discussed re: [place] screening; ultrasound in 6 months.  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  2021 PSA less than 0.1. STABLE.  DISPOSITION: # follow up in 6 months- MD; labs-cbc/cmp;AFP; iron studies/ferritin;US RUQ prior - Dr.B  Cc: Dr.Fisher-

## 2021-09-20 LAB — AFP TUMOR MARKER: AFP, Serum, Tumor Marker: 1.8 ng/mL (ref 0.0–8.4)

## 2021-09-22 ENCOUNTER — Ambulatory Visit: Payer: PPO | Admitting: Family Medicine

## 2021-09-29 ENCOUNTER — Telehealth: Payer: Self-pay | Admitting: *Deleted

## 2021-09-29 NOTE — Telephone Encounter (Signed)
Left message on voice mail for him to calls [place] and make an appt.

## 2021-09-29 NOTE — Telephone Encounter (Signed)
Patient left message about tolterodine, he wants to up the doses. He states he has been getting up in the middle of the night more. Two times a night. This has been going on for two weeks now.

## 2021-10-06 ENCOUNTER — Ambulatory Visit: Payer: PPO | Admitting: Gastroenterology

## 2021-10-06 NOTE — Progress Notes (Deleted)
01/24/2019 10:11 AM   Ian Sanders May 02, 1941 [id number]  Referring provider: Birdie Sons, [address] [place],  [place] [address]  Urological history 1. Prostate cancer -PSA <0.1 in 06/2021 -RRP by Dr. Domingo Cocking at [facility] in 1995 -PSA remained below 1 until 2014 when it rose to 9.8 ng/mL -started on ADT with [place] in 03/15  2. High risk hematuria - Former smoker - CT urogram 01/31/2018 noted a 1.2 cm partially exophytic lesion within the inferior pole of the right kidney with suggestion of mild contrast enhancement, raising the possibility of solid renal neoplasm.  There is a 9 mm lesion within the medial superior left kidney with suggestion of possible associated enhancement.  No nephroureterolithiasis. No hydronephrosis. No abnormal filling defects within the opacified renal collecting systems and ureters.  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  Prominent vascularity and stranding lateral to the ascending colon favored to be sequelae of portal venous hypertension.  Recommend clinical correlation for signs of colitis.  Cholelithiasis.  Bilateral femoral head AVN.  There is a 5 mm right lung nodule - cysto 02/09/2018 NED - MRI on 05/17/2018 noted Bosniak category 1 and category 2 renal cysts in both kidneys. No suspicious renal masses - Cysto 12/2020 Prominent hypervascularity of urethra  - CT w/wo 02/2021 Normal adrenal glands. Bilateral too small to characterize renal lesions. 1.7 cm interpolar right renal lesion anteriorly is likely a cyst or minimally complex cyst, slightly enlarged compared to the prior.  There is also a 2.2 cm interpolar left renal cyst.  Minimally complex 1.2 cm interpolar right renal lesion including on 76/12 with similar in size on 01/31/2018 and characterized on interval MRI. Normal urinary bladder. -no reports of gross heme -UA ***  3.  Incontinence -contributing factors of age, CVA, pelvic surgery, HTH, heart disease, depression and alcohol abuse - taking Detrol LA 4 mg daily - PVR ***  No chief complaint on file.    HPI: Ian Sanders is a 80 y.o. male who presents today for further evaluation of increase in nocturia.  UA ***  PVR ***  Risk factors for nocturia: obstructive sleep apnea, hypertension, diabetes, arthritis, asthma, heart disease, anxiety, depression, inflammatory bowel disease and BPH.  ***   PMH: Past Medical History:  Diagnosis Date   Atrial fibrillation (HCC)    Basal cell carcinoma    Benign paroxysmal positional vertigo    Cancer (HCC)    History of chicken pox    History of measles    History of mumps    Hypertension    Mononeuritis of unspecified site    Other seborrheic keratosis    Stroke ([place]) 06/2020   Transient ischemic attack (TIA), and cerebral infarction without residual deficits(V12.54)    Traumatic subdural hematoma without loss of consciousness ([place]) 06/25/2020   Unspecified hypothyroidism     Surgical History: Past Surgical History:  Procedure Laterality Date   Carotid Doppler Ultrasound  04/02/2010   Small amount Calcified plaque bilaterally, no significant stenosis.   CATARACT EXTRACTION W/PHACO Right 04/15/2021   Procedure: CATARACT EXTRACTION PHACO AND INTRAOCULAR LENS PLACEMENT ([place]) RIGHT;  Surgeon: Birder Robson, MD;  Location: [place];  Service: Ophthalmology;  Laterality: Right;  7.98 0:47.2   CATARACT EXTRACTION W/PHACO Left 05/20/2021   Procedure: CATARACT EXTRACTION PHACO AND INTRAOCULAR LENS PLACEMENT ([facility]) LEFT 3.37 00:32.0;  Surgeon: Birder Robson, MD;  Location: [place];  Service: Ophthalmology;  Laterality: Left;  Requests arrival 10A or after   COLONOSCOPY  05/11/2014   Tubular Adenoma. Dr. Allen Norris   COLONOSCOPY WITH PROPOFOL N/A 05/23/2018   Procedure: COLONOSCOPY WITH PROPOFOL;  Surgeon: [name], MD;  Location[facility]  ENDOSCOPY;  Service: Gastroenterology;  Laterality: N/A;   DOPPLER ECHOCARDIOGRAPHY  04/02/2010   Mild left atrial dilation. Normal right atrium. No valvular disease. No thrombus. Normal LV function. EF>55%   ESOPHAGOGASTRODUODENOSCOPY (EGD) WITH PROPOFOL N/A 08/16/2019   Procedure: ESOPHAGOGASTRODUODENOSCOPY (EGD) WITH PROPOFOL;  Surgeon: [name], MD;  Location: [facility] ENDOSCOPY;  Service: Gastroenterology;  Laterality: N/A;   FINGER AMPUTATION     HERNIA REPAIR     MRI Brain with and without contrast  03/20/2010   Suggestive of basal ganglia lacunar infarct   PROSTATECTOMY  1995   Abdominal   SKIN SURGERY     multiple   Thumb surgery      Home Medications:  Allergies as of 10/07/2021       Reactions   Lisinopril Swelling   Tongue swelling   Myrbetriq [mirabegron] Swelling   Of the tongue   Other Other (See Comments)   Symptomatic bradycardia    Triamterene    Other reaction(s): ITCHING,WATERING EYES   Amlodipine Other (See Comments)   Peripheral edema   Atorvastatin Other (See Comments)   Dizziness and Fatigue   Dabigatran Etexilate Mesylate Other (See Comments)   Stomach ulcers (Pradaxa)   Dabigatran Etexilate Mesylate Other (See Comments)   Stomach ulcers (Pradaxa)        Medication List        Accurate as of October 06, 2021 10:11 AM. If you have any questions, ask your nurse or doctor.          calcium-vitamin D 250-125 MG-UNIT tablet Commonly known as: OSCAL Take 1 tablet by mouth 2 (two) times daily.   chlorthalidone 25 MG tablet Commonly known as: HYGROTON Take 1 tablet (25 mg total) by mouth daily.   cyclobenzaprine 10 MG tablet Commonly known as: FLEXERIL Take 0.5 tablets (5 mg total) by mouth 2 (two) times daily as needed.   Ferrous Sulfate 134 MG Tabs Take 1 tablet by mouth daily.   flecainide 50 MG tablet Commonly known as: TAMBOCOR Take 1 tablet (50 mg total) by mouth 2 (two) times daily.   gabapentin 100 MG capsule Commonly known  as: NEURONTIN Take 1 capsule (100 mg total) by mouth 3 (three) times daily.   Leuprolide Acetate (6 Month) 45 MG injection Commonly known as: LUPRON Inject 45 mg into the muscle every 6 (six) months.   losartan 25 MG tablet Commonly known as: COZAAR Take 25 mg by mouth 2 (two) times daily.   omeprazole 40 MG capsule Commonly known as: PRILOSEC TAKE ONE CAPSULE DAILY.   Potassium 99 MG Tabs Take by mouth.   propranolol 10 MG tablet Commonly known as: INDERAL Take 10 mg by mouth 2 (two) times daily.   tolterodine 4 MG 24 hr capsule Commonly known as: DETROL LA Take 1 capsule (4 mg total) by mouth daily.   traMADol 50 MG tablet Commonly known as: ULTRAM Take 1 tablet (50 mg total) by mouth every 6 (six) hours as needed.   Vitamin A 2400 MCG (8000 UT) Caps Take by mouth.   VITAMIN B-12 PO Take 1 tablet by mouth daily.   zolpidem 10 MG tablet Commonly known as: AMBIEN TAKE ONE-HALF TO ONE TABLET AT BEDTIME.        Allergies:  Allergies  Allergen Reactions   Lisinopril Swelling    Tongue swelling   Myrbetriq [Mirabegron] Swelling    Of the tongue   Other Other (See Comments)    Symptomatic bradycardia    Triamterene     Other reaction(s): ITCHING,WATERING EYES   Amlodipine Other (See Comments)    Peripheral edema   Atorvastatin Other (See Comments)    Dizziness and Fatigue   Dabigatran Etexilate Mesylate Other (See Comments)    Stomach ulcers (Pradaxa)   Dabigatran Etexilate Mesylate Other (See Comments)    Stomach ulcers (Pradaxa)    Family History: Family History  Problem Relation Age of Onset   Heart disease Mother 45       CABG   Hypertension Mother    Heart attack Mother    Hypertension Brother    Heart disease Sister        stents   Prostate cancer Brother    Kidney cancer Brother    Skin cancer Brother    Hypertension Brother    Bladder Cancer Sister    Leukemia Father    Stroke Father        multiple   Cerebral aneurysm Sister     Kidney cancer Brother     Social History:  reports that he has quit smoking. His smoking use included cigarettes. His smokeless tobacco use includes chew. He reports that he does not currently use alcohol after a past usage of about 5.0 standard drinks per week. He reports that he does not use drugs.  ROS: Pertinent ROS in HPI  Physical Exam: There were no vitals taken for this visit.  Constitutional:  Well nourished. Alert and oriented, No acute distress. HEENT: [place] AT, moist mucus membranes.  Trachea midline Cardiovascular: No clubbing, cyanosis, or edema. Respiratory: Normal respiratory effort, no increased work of breathing. GI: Abdomen is soft, non tender, non distended, no abdominal masses. Liver and spleen not palpable.  No hernias appreciated.  Stool sample for occult testing is not indicated.   GU: No CVA tenderness.  No bladder fullness or masses.  Patient with circumcised/uncircumcised phallus. ***Foreskin easily retracted***  Urethral meatus is patent.  No penile discharge. No penile lesions or rashes. Scrotum without lesions, cysts, rashes and/or edema.  Testicles are located scrotally bilaterally. No masses are appreciated in the testicles. Left and right epididymis are normal. Rectal: Patient with  normal sphincter tone. Anus and perineum without scarring or rashes. No rectal masses are appreciated. Prostate is approximately *** grams, *** nodules are appreciated. Seminal vesicles are normal. Skin: No rashes, bruises or suspicious lesions. Lymph: No inguinal adenopathy. Neurologic: Grossly intact, no focal deficits, moving all 4 extremities. Psychiatric: Normal mood and affect.   Laboratory Data: Lab Results  Component Value Date   WBC 2.6 (L) 09/19/2021   HGB 11.8 (L) 09/19/2021   HCT 35.5 (L) 09/19/2021   MCV 80.3 09/19/2021   PLT 52 (L) 09/19/2021    Lab Results  Component Value Date   CREATININE 0.83 09/19/2021   Lab Results  Component Value Date   HGBA1C 5.1  07/13/2021   Lab Results  Component Value Date   AST 30 09/19/2021   Lab Results  Component Value Date   ALT 15 09/19/2021   Urinalysis ***  I have reviewed the labs.  Pertinent Imaging N/A   Assessment & Plan:    1. Nocturia ***  1. Prostate cancer -PSA undetectable  -ADT given in July 2022 -RTC in 6 months for next injection  2. High  risk hematuria -UA neg for micro heme  3. Urge incontinence -PVR 0  mL -continue Detrol LA 4 mg daily-refills given  No follow-ups on file.  Zara Council, PA-C  [facility] [address] [place], [place] [address] [phone]

## 2021-10-07 ENCOUNTER — Ambulatory Visit: Payer: PPO | Admitting: Urology

## 2021-10-07 DIAGNOSIS — C61 Malignant neoplasm of prostate: Secondary | ICD-10-CM

## 2021-10-07 DIAGNOSIS — N3941 Urge incontinence: Secondary | ICD-10-CM

## 2021-10-07 DIAGNOSIS — R351 Nocturia: Secondary | ICD-10-CM

## 2021-10-07 DIAGNOSIS — R319 Hematuria, unspecified: Secondary | ICD-10-CM

## 2021-10-13 NOTE — Progress Notes (Signed)
01/24/2019 2:33 PM   Ian Sanders 1941/10/17 [id number]  Referring provider: Birdie Sons, [address] [place],  [place] [address]  Chief Complaint  Patient presents with   Follow-up    Urological history 1. Prostate cancer -PSA <0.1 in 06/2021 -RRP by Dr. Domingo Cocking at [facility] in 1995 -PSA remained below 1 until 2014 when it rose to 9.8 ng/mL -started on ADT with [place] in 03/15  2. High risk hematuria - Former smoker - CT urogram 01/31/2018 noted a 1.2 cm partially exophytic lesion within the inferior pole of the right kidney with suggestion of mild contrast enhancement, raising the possibility of solid renal neoplasm.  There is a 9 mm lesion within the medial superior left kidney with suggestion of possible associated enhancement.  No nephroureterolithiasis. No hydronephrosis. No abnormal filling defects within the opacified renal collecting systems and ureters.  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  Prominent vascularity and stranding lateral to the ascending colon favored to be sequelae of portal venous hypertension.  Recommend clinical correlation for signs of colitis.  Cholelithiasis.  Bilateral femoral head AVN.  There is a 5 mm right lung nodule - cysto 02/09/2018 NED - MRI on 05/17/2018 noted Bosniak category 1 and category 2 renal cysts in both kidneys. No suspicious renal masses - Cysto 12/2020 Prominent hypervascularity of urethra  - CT w/wo 02/2021 Normal adrenal glands. Bilateral too small to characterize renal lesions. 1.7 cm interpolar right renal lesion anteriorly is likely a cyst or minimally complex cyst, slightly enlarged compared to the prior.  There is also a 2.2 cm interpolar left renal cyst.  Minimally complex 1.2 cm interpolar right renal lesion including on 76/12 with similar in size on 01/31/2018 and characterized on interval MRI. Normal urinary bladder. -no  reports of gross heme -UA no micro heme  3. Incontinence -contributing factors of age, CVA, pelvic surgery, HTH, heart disease, depression and alcohol abuse - taking Detrol LA 4 mg daily - PVR 0 mL    HPI: Ian Sanders is a 80 y.o. male who presents today for further evaluation of increase in nocturia.  UA benign   PVR 0 mL   He is experiencing a worsening of incontinence since August.  He is wearing pads daily with moderate volume loss.   He consumes 20 ounces of water daily, two cups of coffee, no sodas, no teas, glass of OJ in the morning and no alcohol.    Patient denies any modifying or aggravating factors.  Patient denies any gross hematuria, dysuria or suprapubic/flank pain.  Patient denies any fevers, chills, nausea or vomiting.    PMH: Past Medical History:  Diagnosis Date   Atrial fibrillation (HCC)    Basal cell carcinoma    Benign paroxysmal positional vertigo    Cancer (HCC)    History of chicken pox    History of measles    History of mumps    Hypertension    Mononeuritis of unspecified site    Other seborrheic keratosis    Stroke ([place]) 06/2020   Transient ischemic attack (TIA), and cerebral infarction without residual deficits(V12.54)    Traumatic subdural hematoma without loss of consciousness ([place]) 06/25/2020   Unspecified hypothyroidism     Surgical History: Past Surgical History:  Procedure Laterality Date   Carotid Doppler Ultrasound  04/02/2010   Small amount Calcified plaque bilaterally, no significant stenosis.   CATARACT EXTRACTION W/PHACO Right 04/15/2021  Procedure: CATARACT EXTRACTION PHACO AND INTRAOCULAR LENS PLACEMENT ([place]) RIGHT;  Surgeon: Birder Robson, MD;  Location: [place];  Service: Ophthalmology;  Laterality: Right;  7.98 0:47.2   CATARACT EXTRACTION W/PHACO Left 05/20/2021   Procedure: CATARACT EXTRACTION PHACO AND INTRAOCULAR LENS PLACEMENT ([facility]) LEFT 3.37 00:32.0;  Surgeon: Birder Robson, MD;  Location:  [place];  Service: Ophthalmology;  Laterality: Left;  Requests arrival 10A or after   COLONOSCOPY  05/11/2014   Tubular Adenoma. Dr. Allen Norris   COLONOSCOPY WITH PROPOFOL N/A 05/23/2018   Procedure: COLONOSCOPY WITH PROPOFOL;  Surgeon: [name], MD;  Location: [facility] ENDOSCOPY;  Service: Gastroenterology;  Laterality: N/A;   DOPPLER ECHOCARDIOGRAPHY  04/02/2010   Mild left atrial dilation. Normal right atrium. No valvular disease. No thrombus. Normal LV function. EF>55%   ESOPHAGOGASTRODUODENOSCOPY (EGD) WITH PROPOFOL N/A 08/16/2019   Procedure: ESOPHAGOGASTRODUODENOSCOPY (EGD) WITH PROPOFOL;  Surgeon: [name], MD;  [facility] ENDOSCOPY;  Service: Gastroenterology;  Laterality: N/A;   FINGER AMPUTATION     HERNIA REPAIR     MRI Brain with and without contrast  03/20/2010   Suggestive of basal ganglia lacunar infarct   PROSTATECTOMY  1995   Abdominal   SKIN SURGERY     multiple   Thumb surgery      Home Medications:  Allergies as of 10/14/2021       Reactions   Lisinopril Swelling   Tongue swelling   Myrbetriq [mirabegron] Swelling   Of the tongue   Other Other (See Comments)   Symptomatic bradycardia    Triamterene    Other reaction(s): ITCHING,WATERING EYES   Amlodipine Other (See Comments)   Peripheral edema   Atorvastatin Other (See Comments)   Dizziness and Fatigue   Dabigatran Etexilate Mesylate Other (See Comments)   Stomach ulcers (Pradaxa)   Dabigatran Etexilate Mesylate Other (See Comments)   Stomach ulcers (Pradaxa)        Medication List        Accurate as of October 14, 2021  2:33 PM. If you have any questions, ask your nurse or doctor.          STOP taking these medications    tolterodine 4 MG 24 hr capsule Commonly known as: DETROL LA Stopped by: Dona Walby, PA-C       TAKE these medications    calcium-vitamin D 250-125 MG-UNIT tablet Commonly known as: OSCAL Take 1 tablet by mouth 2 (two) times daily.    chlorthalidone 25 MG tablet Commonly known as: HYGROTON Take 1 tablet (25 mg total) by mouth daily.   cyclobenzaprine 10 MG tablet Commonly known as: FLEXERIL Take 0.5 tablets (5 mg total) by mouth 2 (two) times daily as needed.   Ferrous Sulfate 134 MG Tabs Take 1 tablet by mouth daily.   flecainide 50 MG tablet Commonly known as: TAMBOCOR Take 1 tablet (50 mg total) by mouth 2 (two) times daily.   gabapentin 100 MG capsule Commonly known as: NEURONTIN Take 1 capsule (100 mg total) by mouth 3 (three) times daily.   Gemtesa 75 MG Tabs Generic drug: Vibegron Take 75 mg by mouth daily. Started by: Zara Council, PA-C   Leuprolide Acetate (6 Month) 45 MG injection Commonly known as: LUPRON Inject 45 mg into the muscle every 6 (six) months.   losartan 25 MG tablet Commonly known as: COZAAR Take 25 mg by mouth 2 (two) times daily.   omeprazole 40 MG capsule Commonly known as: PRILOSEC TAKE ONE CAPSULE DAILY.   Potassium 99  MG Tabs Take by mouth.   propranolol 10 MG tablet Commonly known as: INDERAL Take 10 mg by mouth 2 (two) times daily.   traMADol 50 MG tablet Commonly known as: ULTRAM Take 1 tablet (50 mg total) by mouth every 6 (six) hours as needed.   Vitamin A 2400 MCG (8000 UT) Caps Take by mouth.   VITAMIN B-12 PO Take 1 tablet by mouth daily.   zolpidem 10 MG tablet Commonly known as: AMBIEN TAKE ONE-HALF TO ONE TABLET AT BEDTIME.        Allergies:  Allergies  Allergen Reactions   Lisinopril Swelling    Tongue swelling   Myrbetriq [Mirabegron] Swelling    Of the tongue   Other Other (See Comments)    Symptomatic bradycardia    Triamterene     Other reaction(s): ITCHING,WATERING EYES   Amlodipine Other (See Comments)    Peripheral edema   Atorvastatin Other (See Comments)    Dizziness and Fatigue   Dabigatran Etexilate Mesylate Other (See Comments)    Stomach ulcers (Pradaxa)   Dabigatran Etexilate Mesylate Other (See Comments)     Stomach ulcers (Pradaxa)    Family History: Family History  Problem Relation Age of Onset   Heart disease Mother 71       CABG   Hypertension Mother    Heart attack Mother    Hypertension Brother    Heart disease Sister        stents   Prostate cancer Brother    Kidney cancer Brother    Skin cancer Brother    Hypertension Brother    Bladder Cancer Sister    Leukemia Father    Stroke Father        multiple   Cerebral aneurysm Sister    Kidney cancer Brother     Social History:  reports that he has quit smoking. His smoking use included cigarettes. His smokeless tobacco use includes chew. He reports that he does not currently use alcohol after a past usage of about 5.0 standard drinks per week. He reports that he does not use drugs.  ROS: Pertinent ROS in HPI  Physical Exam: BP (!) 162/69 (BP Location: Left Arm, Patient Position: Sitting, Cuff Size: Large)   Pulse 66   Ht 5\' 10"  (1.778 m)   Wt 165 lb (74.8 kg)   BMI 23.68 kg/m   Constitutional:  Well nourished. Alert and oriented, No acute distress. HEENT: [place] AT, mask in place.  Trachea midline Cardiovascular: No clubbing, cyanosis, or edema. Respiratory: Normal respiratory effort, no increased work of breathing. Neurologic: Grossly intact, no focal deficits, moving all 4 extremities. Psychiatric: Normal mood and affect.   Laboratory Data: Lab Results  Component Value Date   WBC 2.6 (L) 09/19/2021   HGB 11.8 (L) 09/19/2021   HCT 35.5 (L) 09/19/2021   MCV 80.3 09/19/2021   PLT 52 (L) 09/19/2021    Lab Results  Component Value Date   CREATININE 0.83 09/19/2021   Lab Results  Component Value Date   HGBA1C 5.1 07/13/2021   Lab Results  Component Value Date   AST 30 09/19/2021   Lab Results  Component Value Date   ALT 15 09/19/2021   Urinalysis Component     Latest Ref Rng & Units 10/14/2021  Specific Gravity, UA     1.005 - 1.030 1.015  pH, UA     5.0 - 7.5 7.0  Color, UA     Yellow Yellow   Appearance Ur  Clear Clear  Leukocytes,UA     Negative Negative  Protein,UA     Negative/Trace Negative  Glucose, UA     Negative Negative  Ketones, UA     Negative Negative  RBC, UA     Negative Negative  Bilirubin, UA     Negative Negative  Urobilinogen, Ur     0.2 - 1.0 mg/dL 0.2  Nitrite, UA     Negative Negative  Microscopic Examination      See below:   Component     Latest Ref Rng & Units 10/14/2021  WBC, UA     0 - 5 /hpf 0-5  RBC     0 - 2 /hpf 0-2  Epithelial Cells (non renal)     0 - 10 /hpf 0-10  Bacteria, UA     None seen/Few None seen    I have reviewed the labs.  Pertinent Imaging Component     Latest Ref Rng & Units 10/14/2021  Scan Result      0 mL     Assessment & Plan:    1. Prostate cancer -PSA undetectable  -ADT given in July 2022 -RTC in 6 months for next injection  2. High risk hematuria -UA neg for micro heme  3. Urge incontinence -PVR 0  mL -discontinue Detrol LA 4 mg daily -start Gemtesa 75 mg, # 28 samples as patient is not a candidate for anticholinergics due to his advanced age and he is not a candidate for Myrbetriq as his history of TIA and brain aneurysm he cannot afford an elevation of blood pressure  Return in about 3 weeks (around 11/04/2021) for IPSS and PVR.  Zara Council, PA-C  [facility] [address] [place],  [address] [phone]

## 2021-10-14 ENCOUNTER — Other Ambulatory Visit: Payer: Self-pay

## 2021-10-14 ENCOUNTER — Ambulatory Visit (INDEPENDENT_AMBULATORY_CARE_PROVIDER_SITE_OTHER): Payer: PPO | Admitting: Urology

## 2021-10-14 ENCOUNTER — Encounter: Payer: Self-pay | Admitting: Urology

## 2021-10-14 VITALS — BP 162/69 | HR 66 | Ht 70.0 in | Wt 165.0 lb

## 2021-10-14 DIAGNOSIS — R351 Nocturia: Secondary | ICD-10-CM | POA: Diagnosis not present

## 2021-10-14 DIAGNOSIS — N3941 Urge incontinence: Secondary | ICD-10-CM | POA: Diagnosis not present

## 2021-10-14 DIAGNOSIS — C61 Malignant neoplasm of prostate: Secondary | ICD-10-CM

## 2021-10-14 LAB — URINALYSIS, COMPLETE
Bilirubin, UA: NEGATIVE
Glucose, UA: NEGATIVE
Ketones, UA: NEGATIVE
Leukocytes,UA: NEGATIVE
Nitrite, UA: NEGATIVE
Protein,UA: NEGATIVE
RBC, UA: NEGATIVE
Specific Gravity, UA: 1.015 (ref 1.005–1.030)
Urobilinogen, Ur: 0.2 mg/dL (ref 0.2–1.0)
pH, UA: 7 (ref 5.0–7.5)

## 2021-10-14 LAB — MICROSCOPIC EXAMINATION: Bacteria, UA: NONE SEEN

## 2021-10-14 LAB — BLADDER SCAN AMB NON-IMAGING: Scan Result: 0

## 2021-10-14 MED ORDER — GEMTESA 75 MG PO TABS: 75.0000 mg | ORAL_TABLET | Freq: Every day | ORAL | 0 refills | Status: DC

## 2021-10-20 ENCOUNTER — Ambulatory Visit: Payer: PPO | Admitting: Gastroenterology

## 2021-10-27 DIAGNOSIS — D692 Other nonthrombocytopenic purpura: Secondary | ICD-10-CM | POA: Diagnosis not present

## 2021-10-27 DIAGNOSIS — C61 Malignant neoplasm of prostate: Secondary | ICD-10-CM | POA: Diagnosis not present

## 2021-10-27 DIAGNOSIS — I4891 Unspecified atrial fibrillation: Secondary | ICD-10-CM | POA: Diagnosis not present

## 2021-11-03 NOTE — Progress Notes (Signed)
01/24/2019 1:53 PM   Ian Sanders [birth year] [id number]  Referring provider: Birdie Sons, [place],  [place] [address]  Chief Complaint  Patient presents with   Urinary Incontinence    Urological history 1. Prostate cancer -PSA <0.1 in 06/2021 -RRP by Dr. Domingo Cocking at [facility] in 1995 -PSA remained below 1 until 2014 when it rose to 9.8 ng/mL -started on ADT with [place] in 03/15  2. High risk hematuria - Former smoker - CT urogram 01/31/2018 noted a 1.2 cm partially exophytic lesion within the inferior pole of the right kidney with suggestion of mild contrast enhancement, raising the possibility of solid renal neoplasm.  There is a 9 mm lesion within the medial superior left kidney with suggestion of possible associated enhancement.  No nephroureterolithiasis. No hydronephrosis. No abnormal filling defects within the opacified renal collecting systems and ureters.  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  Prominent vascularity and stranding lateral to the ascending colon favored to be sequelae of portal venous hypertension.  Recommend clinical correlation for signs of colitis.  Cholelithiasis.  Bilateral femoral head AVN.  There is a 5 mm right lung nodule - cysto 02/09/2018 NED - MRI on 05/17/2018 noted Bosniak category 1 and category 2 renal cysts in both kidneys. No suspicious renal masses - Cysto 12/2020 Prominent hypervascularity of urethra  - CT w/wo 02/2021 Normal adrenal glands. Bilateral too small to characterize renal lesions. 1.7 cm interpolar right renal lesion anteriorly is likely a cyst or minimally complex cyst, slightly enlarged compared to the prior.  There is also a 2.2 cm interpolar left renal cyst.  Minimally complex 1.2 cm interpolar right renal lesion including on 76/12 with similar in size on 01/31/2018 and characterized on interval MRI. Normal urinary  bladder. -no reports of gross heme -UA no micro heme  3. Incontinence -contributing factors of age, CVA, pelvic surgery, HTH, heart disease, depression and alcohol abuse - I PSS 7/3 - PVR 0 mL    HPI: Ian Sanders is a 80 y.o. male who presents today after 3-week trial Gemtesa 75 mg nightly.  At his visit 3 weeks ago, he was starting to experience a worsening of his urinary incontinence.  He was started on Gemtesa 75 mg to see if this would decrease the incontinence episodes.  PVR 0 mL  Patient feels the [place]ores curbed his urinary leakage and would like to continue the medication.  Patient denies any modifying or aggravating factors.  Patient denies any gross hematuria, dysuria or suprapubic/flank pain.  Patient denies any fevers, chills, nausea or vomiting.     IPSS     Row Name 11/04/21 1300         International Prostate Symptom Score   How often have you had the sensation of not emptying your bladder? Not at All     How often have you had to urinate less than every two hours? Not at All     How often have you found you stopped and started again several times when you urinated? Not at All     How often have you found it difficult to postpone urination? Less than 1 in 5 times     How often have you had a weak urinary stream? Almost always     How often have you had to strain to start urination? Less than 1 in 5 times     How many  times did you typically get up at night to urinate? None     Total IPSS Score 7       Quality of Life due to urinary symptoms   If you were to spend the rest of your life with your urinary condition just the way it is now how would you feel about that? Mixed              Score:  1-7 Mild 8-19 Moderate 20-35 Severe   PMH: Past Medical History:  Diagnosis Date   Atrial fibrillation (HCC)    Basal cell carcinoma    Benign paroxysmal positional vertigo    Cancer (HCC)    History of chicken pox    History of measles    History of  mumps    Hypertension    Mononeuritis of unspecified site    Other seborrheic keratosis    Stroke ([place]) 06/2020   Transient ischemic attack (TIA), and cerebral infarction without residual deficits(V12.54)    Traumatic subdural hematoma without loss of consciousness ([place]) 06/25/2020   Unspecified hypothyroidism     Surgical History: Past Surgical History:  Procedure Laterality Date   Carotid Doppler Ultrasound  04/02/2010   Small amount Calcified plaque bilaterally, no significant stenosis.   CATARACT EXTRACTION W/PHACO Right 04/15/2021   Procedure: CATARACT EXTRACTION PHACO AND INTRAOCULAR LENS PLACEMENT ([place]) RIGHT;  Surgeon: Birder Robson, MD;  Location: [place];  Service: Ophthalmology;  Laterality: Right;  7.98 0:47.2   CATARACT EXTRACTION W/PHACO Left 05/20/2021   Procedure: CATARACT EXTRACTION PHACO AND INTRAOCULAR LENS PLACEMENT ([facility]) LEFT 3.37 00:32.0;  Surgeon: Birder Robson, MD;  Location: [place];  Service: Ophthalmology;  Laterality: Left;  Requests arrival 10A or after   COLONOSCOPY  05/11/2014   Tubular Adenoma. Dr. Allen Norris   COLONOSCOPY WITH PROPOFOL N/A 05/23/2018   Procedure: COLONOSCOPY WITH PROPOFOL;  Surgeon: [name], MD;  Location[facility] ENDOSCOPY;  Service: Gastroenterology;  Laterality: N/A;   DOPPLER ECHOCARDIOGRAPHY  04/02/2010   Mild left atrial dilation. Normal right atrium. No valvular disease. No thrombus. Normal LV function. EF>55%   ESOPHAGOGASTRODUODENOSCOPY (EGD) WITH PROPOFOL N/A 08/16/2019   Procedure: ESOPHAGOGASTRODUODENOSCOPY (EGD) WITH PROPOFOL;  Surgeon: [name], MD;  Location: [facility] ENDOSCOPY;  Service: Gastroenterology;  Laterality: N/A;   FINGER AMPUTATION     HERNIA REPAIR     MRI Brain with and without contrast  03/20/2010   Suggestive of basal ganglia lacunar infarct   PROSTATECTOMY  1995   Abdominal   SKIN SURGERY     multiple   Thumb surgery      Home Medications:  Allergies as of 11/04/2021        Reactions   Lisinopril Swelling   Tongue swelling   Myrbetriq [mirabegron] Swelling   Of the tongue   Other Other (See Comments)   Symptomatic bradycardia    Triamterene    Other reaction(s): ITCHING,WATERING EYES   Amlodipine Other (See Comments)   Peripheral edema   Atorvastatin Other (See Comments)   Dizziness and Fatigue   Dabigatran Etexilate Mesylate Other (See Comments)   Stomach ulcers (Pradaxa)   Dabigatran Etexilate Mesylate Other (See Comments)   Stomach ulcers (Pradaxa)        Medication List        Accurate as of November 04, 2021  1:53 PM. If you have any questions, ask your nurse or doctor.          calcium-vitamin D 250-125 MG-UNIT tablet Commonly known as: OSCAL Take  1 tablet by mouth 2 (two) times daily.   chlorthalidone 25 MG tablet Commonly known as: HYGROTON Take 1 tablet (25 mg total) by mouth daily.   cyclobenzaprine 10 MG tablet Commonly known as: FLEXERIL Take 0.5 tablets (5 mg total) by mouth 2 (two) times daily as needed.   Ferrous Sulfate 134 MG Tabs Take 1 tablet by mouth daily.   flecainide 50 MG tablet Commonly known as: TAMBOCOR Take 1 tablet (50 mg total) by mouth 2 (two) times daily.   gabapentin 100 MG capsule Commonly known as: NEURONTIN Take 1 capsule (100 mg total) by mouth 3 (three) times daily.   Gemtesa 75 MG Tabs Generic drug: Vibegron Take 75 mg by mouth daily.   Leuprolide Acetate (6 Month) 45 MG injection Commonly known as: LUPRON Inject 45 mg into the muscle every 6 (six) months.   losartan 25 MG tablet Commonly known as: COZAAR Take 25 mg by mouth 2 (two) times daily.   omeprazole 40 MG capsule Commonly known as: PRILOSEC TAKE ONE CAPSULE DAILY.   Potassium 99 MG Tabs Take by mouth.   propranolol 10 MG tablet Commonly known as: INDERAL Take 10 mg by mouth 2 (two) times daily.   traMADol 50 MG tablet Commonly known as: ULTRAM Take 1 tablet (50 mg total) by mouth every 6 (six) hours as  needed.   Vitamin A 2400 MCG (8000 UT) Caps Take by mouth.   VITAMIN B-12 PO Take 1 tablet by mouth daily.   zolpidem 10 MG tablet Commonly known as: AMBIEN TAKE ONE-HALF TO ONE TABLET AT BEDTIME.        Allergies:  Allergies  Allergen Reactions   Lisinopril Swelling    Tongue swelling   Myrbetriq [Mirabegron] Swelling    Of the tongue   Other Other (See Comments)    Symptomatic bradycardia    Triamterene     Other reaction(s): ITCHING,WATERING EYES   Amlodipine Other (See Comments)    Peripheral edema   Atorvastatin Other (See Comments)    Dizziness and Fatigue   Dabigatran Etexilate Mesylate Other (See Comments)    Stomach ulcers (Pradaxa)   Dabigatran Etexilate Mesylate Other (See Comments)    Stomach ulcers (Pradaxa)    Family History: Family History  Problem Relation Age of Onset   Heart disease Mother 1       CABG   Hypertension Mother    Heart attack Mother    Hypertension Brother    Heart disease Sister        stents   Prostate cancer Brother    Kidney cancer Brother    Skin cancer Brother    Hypertension Brother    Bladder Cancer Sister    Leukemia Father    Stroke Father        multiple   Cerebral aneurysm Sister    Kidney cancer Brother     Social History:  reports that he has quit smoking. His smoking use included cigarettes. His smokeless tobacco use includes chew. He reports that he does not currently use alcohol after a past usage of about 5.0 standard drinks per week. He reports that he does not use drugs.  ROS: Pertinent ROS in HPI  Physical Exam: BP (!) 161/70    Pulse (!) 58    Ht 5\' 10"  (1.778 m)    Wt 160 lb (72.6 kg)    BMI 22.96 kg/m   Constitutional:  Well nourished. Alert and oriented, No acute distress. HEENT: [place] AT, mask in  place.  Trachea midline Cardiovascular: No clubbing, cyanosis, or edema. Respiratory: Normal respiratory effort, no increased work of breathing. Neurologic: Grossly intact, no focal deficits, moving  all 4 extremities. Psychiatric: Normal mood and affect.   Laboratory Data: N/A   Pertinent Imaging  Most Recent  Scan Result 0 11/04/21 13:27    Assessment & Plan:    1. Prostate cancer -PSA undetectable  -ADT given in July 2022 -RTC in 6 months for next injection and PSA -12/2021  2. High risk hematuria -UA neg for micro heme  3. Urge incontinence -PVR 0  mL -Failed Detrol, Enablex and oxybutynin and experienced extreme dry mouth with all 3 of them -Patient experienced angioedema with Myrbetriq -Patient at goal with Gemtesa 75 mg daily -Prescription sent to pharmacy  Return for February for ADT injection and PSA .  Zara Council, PA-C  [facility] [address] [place], [place] [address] [phone]

## 2021-11-04 ENCOUNTER — Encounter: Payer: Self-pay | Admitting: Urology

## 2021-11-04 ENCOUNTER — Other Ambulatory Visit: Payer: Self-pay

## 2021-11-04 ENCOUNTER — Ambulatory Visit (INDEPENDENT_AMBULATORY_CARE_PROVIDER_SITE_OTHER): Payer: PPO | Admitting: Urology

## 2021-11-04 VITALS — BP 161/70 | HR 58 | Ht 70.0 in | Wt 160.0 lb

## 2021-11-04 DIAGNOSIS — C61 Malignant neoplasm of prostate: Secondary | ICD-10-CM

## 2021-11-04 DIAGNOSIS — N3941 Urge incontinence: Secondary | ICD-10-CM | POA: Diagnosis not present

## 2021-11-04 LAB — BLADDER SCAN AMB NON-IMAGING: Scan Result: 0

## 2021-11-04 MED ORDER — GEMTESA 75 MG PO TABS: 75.0000 mg | ORAL_TABLET | Freq: Every day | ORAL | 3 refills | Status: DC

## 2021-11-21 ENCOUNTER — Encounter: Payer: Self-pay | Admitting: Family Medicine

## 2021-11-21 ENCOUNTER — Other Ambulatory Visit: Payer: Self-pay

## 2021-11-21 ENCOUNTER — Ambulatory Visit (INDEPENDENT_AMBULATORY_CARE_PROVIDER_SITE_OTHER): Payer: PPO | Admitting: Family Medicine

## 2021-11-21 VITALS — BP 146/55 | HR 54 | Temp 98.0°F | Wt 167.0 lb

## 2021-11-21 DIAGNOSIS — D61818 Other pancytopenia: Secondary | ICD-10-CM

## 2021-11-21 DIAGNOSIS — G8929 Other chronic pain: Secondary | ICD-10-CM | POA: Diagnosis not present

## 2021-11-21 DIAGNOSIS — D696 Thrombocytopenia, unspecified: Secondary | ICD-10-CM

## 2021-11-21 DIAGNOSIS — I10 Essential (primary) hypertension: Secondary | ICD-10-CM

## 2021-11-21 DIAGNOSIS — R4189 Other symptoms and signs involving cognitive functions and awareness: Secondary | ICD-10-CM

## 2021-11-21 DIAGNOSIS — R609 Edema, unspecified: Secondary | ICD-10-CM | POA: Diagnosis not present

## 2021-11-21 DIAGNOSIS — M545 Low back pain, unspecified: Secondary | ICD-10-CM | POA: Diagnosis not present

## 2021-11-21 MED ORDER — CYCLOBENZAPRINE HCL 10 MG PO TABS: ORAL_TABLET | ORAL | 2 refills | Status: DC

## 2021-11-21 NOTE — Progress Notes (Signed)
Established patient visit   Patient: Ian Sanders   DOB: [birth year]   81 y.o. Male  MRN: [id number] Visit Date: 11/21/2021  Today's healthcare provider: Lelon Huh, MD   No chief complaint on file.  Subjective    HPI  Hypertension, follow-up  BP Readings from Last 3 Encounters:  11/21/21 (!) 146/55  11/04/21 (!) 161/70  10/14/21 (!) 162/69   Wt Readings from Last 3 Encounters:  11/21/21 167 lb (75.8 kg)  11/04/21 160 lb (72.6 kg)  10/14/21 165 lb (74.8 kg)     He was last seen for hypertension 7 months ago.  BP at that visit was 146/72. Management since that visit includes no changes.  He reports excellent compliance with treatment. He is not having side effects.  He is following a Regular diet. He is exercising. He does not smoke.  Use of agents associated with hypertension: none.   Outside blood pressures are checked occasionally.  Pt states his blood pressure is "all over the place". Symptoms: No chest pain No chest pressure  No palpitations No syncope  No dyspnea No orthopnea  No paroxysmal nocturnal dyspnea No lower extremity edema   Pertinent labs: Lab Results  Component Value Date   CHOL 124 07/13/2021   HDL 32 (L) 07/13/2021   LDLCALC 71 07/13/2021   TRIG 104 07/13/2021   CHOLHDL 3.9 07/13/2021   Lab Results  Component Value Date   NA 130 (L) 09/19/2021   K 3.4 (L) 09/19/2021   CREATININE 0.83 09/19/2021   GFRNONAA >60 09/19/2021   GLUCOSE 99 09/19/2021   TSH 3.260 04/17/2016     The ASCVD Risk score (Arnett DK, et al., 2019) failed to calculate for the following reasons:   The 2019 ASCVD risk score is only valid for ages 83 to 29   The patient has a prior MI or stroke diagnosis   --------------------------------------------------------------------------------------------------- Prediabetes, Follow-up  Lab Results  Component Value Date   HGBA1C 5.1 07/13/2021   HGBA1C 5.3 03/17/2021   HGBA1C 5.5 07/17/2020   GLUCOSE 99  09/19/2021   GLUCOSE 98 07/13/2021   GLUCOSE 124 (H) 07/12/2021    Last seen for for this7 months ago.  Management since that visit includes no changes    Normal a1c. 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   Current symptoms include none and have been stable.  Prior visit with dietician: no Current diet: in general, a "healthy" diet    -----------------------------------------------------------------------------------------  He also has hepatic cirrhosis with secondary pan cytopenia, followed off and on by hematology and GI Currently schedule to see Dr Vicente Males later this month, but has no hematology follow up scheduled. He states he gets forgetful at times, A&O x 3 today. He doesn't think it is out of the ordinary for his age.   Medications: Outpatient Medications Prior to Visit  Medication Sig   calcium-vitamin D (OSCAL) 250-125 MG-UNIT per tablet Take 1 tablet by mouth 2 (two) times daily.    Cyanocobalamin (VITAMIN B-12 PO) Take 1 tablet by mouth daily.   cyclobenzaprine (FLEXERIL) 10 MG tablet Take 0.5 tablets (5 mg total) by mouth 2 (two) times daily as needed.   Ferrous Sulfate 134 MG TABS Take 1 tablet by mouth daily.   flecainide (TAMBOCOR) 50 MG tablet Take 1 tablet (50 mg total) by mouth 2 (two) times daily.   gabapentin (NEURONTIN) 100 MG capsule Take 1 capsule (100 mg total) by mouth 3 (three) times daily.  Leuprolide Acetate, 6 Month, (LUPRON) 45 MG injection Inject 45 mg into the muscle every 6 (six) months.   omeprazole (PRILOSEC) 40 MG capsule TAKE ONE CAPSULE DAILY. (Patient taking differently: Take 40 mg by mouth daily.)   Potassium 99 MG TABS Take by mouth in the morning and at bedtime.   propranolol (INDERAL) 10 MG tablet Take 10 mg by mouth 2 (two) times daily.   traMADol (ULTRAM) 50 MG tablet Take 1 tablet (50 mg total) by mouth every 6 (six) hours as needed.   Vibegron (GEMTESA) 75 MG TABS Take 75 mg by mouth daily.   Vitamin A 2400 MCG (8000  UT) CAPS Take by mouth.   zolpidem (AMBIEN) 10 MG tablet TAKE ONE-HALF TO ONE TABLET AT BEDTIME.   chlorthalidone (HYGROTON) 25 MG tablet Take 1 tablet (25 mg total) by mouth daily.   losartan (COZAAR) 25 MG tablet Take 25 mg by mouth 2 (two) times daily.   No facility-administered medications prior to visit.    Review of Systems  Constitutional: Negative.   Respiratory: Negative.    Cardiovascular:  Positive for leg swelling. Negative for chest pain and palpitations.  Gastrointestinal: Negative.   Neurological:  Positive for headaches (Occasionally.). Negative for dizziness and light-headedness.      Objective    BP (!) 146/55 (BP Location: Right Arm, Patient Position: Sitting, Cuff Size: Large)    Pulse (!) 54    Temp 98 F (36.7 C) (Oral)    Wt 167 lb (75.8 kg)    SpO2 100%    BMI 23.96 kg/m    Physical Exam    General: Appearance:    Well developed, well nourished male in no acute distress  Eyes:    PERRL, conjunctiva/corneas clear, EOM's intact       Lungs:     Clear to auscultation bilaterally, respirations unlabored  Heart:    Bradycardic. Irregularly irregular rhythm.  2/6 systolic murmur at right upper sternal border   Neurologic:   Awake, alert, oriented x 3. No apparent focal neurological defect.         Assessment & Plan     1. Primary hypertension Well controlled.  Continue current medications.   - Comprehensive metabolic panel  2. 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  - CBC  3. Edema, unspecified type Chronic and stable.   4. Pancytopenia ([place])   5. Cognitive change Likely age related.  - Ammonia  6. Chronic low back pain, unspecified back pain laterality, unspecified whether sciatica present refill- cyclobenzaprine (FLEXERIL) 10 MG tablet; Take 0.5 tablets (5 mg total) by mouth 2 (two) times daily as needed.  Dispense: 30 tablet; Refill: 2         The entirety of the information documented in the History of Present Illness,  Review of Systems and Physical Exam were personally obtained by me. Portions of this information were initially documented by the CMA and reviewed by me for thoroughness and accuracy.     Lelon Huh, MD  [facility] [phone] (phone) [phone] (fax)  [place]

## 2021-11-22 LAB — CBC
Hematocrit: 36.9 % — ABNORMAL LOW (ref 37.5–51.0)
Hemoglobin: 12.3 g/dL — ABNORMAL LOW (ref 13.0–17.7)
MCH: 27 pg (ref 26.6–33.0)
MCHC: 33.3 g/dL (ref 31.5–35.7)
MCV: 81 fL (ref 79–97)
Platelets: 50 10*3/uL — CL (ref 150–450)
RBC: 4.56 x10E6/uL (ref 4.14–5.80)
RDW: 13.8 % (ref 11.6–15.4)
WBC: 3.1 10*3/uL — ABNORMAL LOW (ref 3.4–10.8)

## 2021-11-22 LAB — AMMONIA: Ammonia: 87 ug/dL (ref 28–135)

## 2021-11-22 LAB — COMPREHENSIVE METABOLIC PANEL
ALT: 13 IU/L (ref 0–44)
AST: 29 IU/L (ref 0–40)
Albumin/Globulin Ratio: 2.1 (ref 1.2–2.2)
Albumin: 4.7 g/dL — ABNORMAL HIGH (ref 3.6–4.6)
Alkaline Phosphatase: 98 IU/L (ref 44–121)
BUN/Creatinine Ratio: 10 (ref 10–24)
BUN: 9 mg/dL (ref 8–27)
Bilirubin Total: 1.1 mg/dL (ref 0.0–1.2)
CO2: 28 mmol/L (ref 20–29)
Calcium: 9.2 mg/dL (ref 8.6–10.2)
Chloride: 96 mmol/L (ref 96–106)
Creatinine, Ser: 0.88 mg/dL (ref 0.76–1.27)
Globulin, Total: 2.2 g/dL (ref 1.5–4.5)
Glucose: 93 mg/dL (ref 70–99)
Potassium: 4.3 mmol/L (ref 3.5–5.2)
Sodium: 135 mmol/L (ref 134–144)
Total Protein: 6.9 g/dL (ref 6.0–8.5)
eGFR: 86 mL/min/{1.73_m2} (ref >59)

## 2021-12-04 ENCOUNTER — Encounter: Payer: Self-pay | Admitting: Gastroenterology

## 2021-12-04 ENCOUNTER — Ambulatory Visit (INDEPENDENT_AMBULATORY_CARE_PROVIDER_SITE_OTHER): Payer: PPO | Admitting: Gastroenterology

## 2021-12-04 VITALS — BP 151/78 | HR 61 | Temp 97.8°F | Wt 166.2 lb

## 2021-12-04 DIAGNOSIS — K7682 Hepatic encephalopathy: Secondary | ICD-10-CM | POA: Diagnosis not present

## 2021-12-04 DIAGNOSIS — K7460 Unspecified cirrhosis of liver: Secondary | ICD-10-CM | POA: Diagnosis not present

## 2021-12-04 MED ORDER — LACTULOSE 10 GM/15ML PO SOLN: 20.0000 g | Freq: Three times a day (TID) | ORAL | 11 refills | Status: DC

## 2021-12-04 MED ORDER — RIFAXIMIN 550 MG PO TABS: 550.0000 mg | ORAL_TABLET | Freq: Two times a day (BID) | ORAL | 11 refills | Status: DC

## 2021-12-04 NOTE — Progress Notes (Signed)
[name] MD, MRCP(U.K) [address]  [place]  [place], [place] [address]  Main: [phone]  Fax: [phone]   Primary Care Physician: Birdie Sons, MD  Primary Gastroenterologist:  Dr. [name]   Chief complaint: Follow-up for liver cirrhosis   HPI: Ian Sanders is a 81 y.o. 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   He has a history of prostate cancer.  He has served in [employer] and spent time at [place].  He has had a tattoo during his service.  He has also had a blood transfusion in 1995.  He absolutely denied any illegal drug use either intravenous or snorting of cocaine.  Iron deficiency anemia secondary to possible hematuria   07/16/2019: EGD: no esophageal varices.   07/13/2019: Not immune to hepatitis A and B and need vaccination.  Low level of iron.  Negative hepatitis B and C.  ANA positive.  Negative smooth muscle antibody.  Ferritin of 25 and iron of 60 .  Celiac serology negative.Negative AMA and smooth muscle antibody.   08/26/2020:USG abdomen shows splenomegaly,  no liver mass   09/16/2020: Hb 11.3, MCV 79, platelet count 47. Bone marrows shows no pathological abnormality as etiology for low platelet count.   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  Gastric underdistention cannot exclude gastric wall thickening gastritis.  Small volume pelvic fluid.  Left-sided gynecomastia.  Prostatectomy without metastatic disease.  Bilateral femoral head avascular necrosis.     Did not tolerate lactulose previously as it caused diarrhea   Interval history 04/03/2021-12/04/2021 No recent ultrasound of the liver to screen for [facility].  11/21/2021: Albumin 4.7 total bilirubin 1.1, creatinine 0.88, hemoglobin 12.3 g platelet count 50 iron studies were normal in November 2022.  No alcohol intake.  Denies any NSAID use.   Doing well.  Bowel movements and not as regular as he would wish.  Not taking any Xifaxan.  Some difficulty sleeping at night.  Some issues with memory.   Current Outpatient Medications  Medication Sig Dispense Refill   calcium-vitamin D (OSCAL) 250-125 MG-UNIT per tablet Take 1 tablet by mouth 2 (two) times daily.      chlorthalidone (HYGROTON) 25 MG tablet Take 1 tablet (25 mg total) by mouth daily. 90 tablet 3   Cyanocobalamin (VITAMIN B-12 PO) Take 1 tablet by mouth daily.     cyclobenzaprine (FLEXERIL) 10 MG tablet Take 0.5 tablets (5 mg total) by mouth 2 (two) times daily as needed. 30 tablet 2   Ferrous Sulfate 134 MG TABS Take 1 tablet by mouth daily.     flecainide (TAMBOCOR) 50 MG tablet Take 1 tablet (50 mg total) by mouth 2 (two) times daily. 180 tablet 3   gabapentin (NEURONTIN) 100 MG capsule Take 1 capsule (100 mg total) by mouth 3 (three) times daily. 270 capsule 4   Leuprolide Acetate, 6 Month, (LUPRON) 45 MG injection Inject 45 mg into the muscle every 6 (six) months.     losartan (COZAAR) 25 MG tablet Take 25 mg by mouth 2 (two) times daily.     omeprazole (PRILOSEC) 40 MG capsule TAKE ONE CAPSULE DAILY. (Patient taking differently: Take 40 mg by mouth daily.) 90 capsule 4   Potassium 99 MG TABS Take by mouth in the morning and at bedtime.     propranolol (INDERAL) 10 MG  tablet Take 10 mg by mouth 2 (two) times daily.     traMADol (ULTRAM) 50 MG tablet Take 1 tablet (50 mg total) by mouth every 6 (six) hours as needed. 30 tablet 3   Vibegron (GEMTESA) 75 MG TABS Take 75 mg by mouth daily. 90 tablet 3   Vitamin A 2400 MCG (8000 UT) CAPS Take by mouth.     zolpidem (AMBIEN) 10 MG tablet TAKE ONE-HALF TO ONE TABLET AT BEDTIME. 30 tablet 2   No current facility-administered medications for this visit.    Allergies as of 12/04/2021 - Review Complete 12/04/2021  Allergen Reaction Noted   Lisinopril Swelling 02/12/2015   Myrbetriq [mirabegron] Swelling 12/26/2015   Other  Other (See Comments) 08/20/2020   Triamterene  08/27/2004   Amlodipine Other (See Comments) 06/11/2020   Atorvastatin Other (See Comments) 06/19/2015   Dabigatran etexilate mesylate Other (See Comments) 12/14/2011   Dabigatran etexilate mesylate Other (See Comments) 12/14/2011    ROS:  General: Negative for anorexia, weight loss, fever, chills, fatigue, weakness. ENT: Negative for hoarseness, difficulty swallowing , nasal congestion. CV: Negative for chest pain, angina, palpitations, dyspnea on exertion, peripheral edema.  Respiratory: Negative for dyspnea at rest, dyspnea on exertion, cough, sputum, wheezing.  GI: See history of present illness. GU:  Negative for dysuria, hematuria, urinary incontinence, urinary frequency, nocturnal urination.  Endo: Negative for unusual weight change.    Physical Examination:   BP (!) 151/78    Pulse 61    Temp 97.8 F (36.6 C) (Oral)    Wt 166 lb 3.2 oz (75.4 kg)    BMI 23.85 kg/m   General: Well-nourished, well-developed in no acute distress.  Eyes: No icterus. Conjunctivae pink. Mouth: Oropharyngeal mucosa moist and pink , no lesions erythema or exudate. Lungs: Clear to auscultation bilaterally. Non-labored. Heart: Regular rate and rhythm, no murmurs rubs or gallops.  Abdomen: Bowel sounds are normal, nontender, nondistended, no hepatosplenomegaly or masses, no abdominal bruits or hernia , no rebound or guarding.   Extremities: No lower extremity edema. No clubbing or deformities. Neuro: Alert and oriented x 3.  Grossly intact. Skin: Warm and dry, no jaundice.   Psych: Alert and cooperative, normal mood and affect.   Imaging Studies: No results found.  Assessment and Plan:   Ian Sanders is a 81 y.o. 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  Compensated.  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   Plan 1.  Right upper quadrant ultrasound to screen for  [place]  2.  EGD to screen for esophageal varices  4.  Check INR to calculate meld score 5.  Restart lactulose.  Suggested him to take 20 cc 3 times daily.  If no better can increase to 30 cc 3 times daily or 4 times daily titrated to 2 soft bowel movements per day.  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  No NSAIDs     I have discussed alternative options, risks & benefits,  which include, but are not limited to, bleeding, infection, perforation,respiratory complication & drug reaction.  The patient agrees with this plan & written consent will be obtained.     Dr [name]  MD,MRCP [facility]) Follow up in 6 months

## 2021-12-04 NOTE — Addendum Note (Signed)
Addended by: Wayna Chalet on: 12/04/2021 05:05 PM   Modules accepted: Orders

## 2021-12-04 NOTE — Patient Instructions (Addendum)
We will call you with your Ultrasound date.

## 2021-12-10 DIAGNOSIS — D61818 Other pancytopenia: Secondary | ICD-10-CM | POA: Diagnosis not present

## 2021-12-10 DIAGNOSIS — I10 Essential (primary) hypertension: Secondary | ICD-10-CM | POA: Diagnosis not present

## 2021-12-10 DIAGNOSIS — K7030 Alcoholic cirrhosis of liver without ascites: Secondary | ICD-10-CM | POA: Diagnosis not present

## 2021-12-10 DIAGNOSIS — R001 Bradycardia, unspecified: Secondary | ICD-10-CM | POA: Diagnosis not present

## 2021-12-10 DIAGNOSIS — G629 Polyneuropathy, unspecified: Secondary | ICD-10-CM | POA: Diagnosis not present

## 2021-12-10 DIAGNOSIS — F1010 Alcohol abuse, uncomplicated: Secondary | ICD-10-CM | POA: Diagnosis not present

## 2021-12-10 DIAGNOSIS — R002 Palpitations: Secondary | ICD-10-CM | POA: Diagnosis not present

## 2021-12-10 DIAGNOSIS — Z8673 Personal history of transient ischemic attack (TIA), and cerebral infarction without residual deficits: Secondary | ICD-10-CM | POA: Diagnosis not present

## 2021-12-10 DIAGNOSIS — E781 Pure hyperglyceridemia: Secondary | ICD-10-CM | POA: Diagnosis not present

## 2021-12-10 DIAGNOSIS — D696 Thrombocytopenia, unspecified: Secondary | ICD-10-CM | POA: Diagnosis not present

## 2021-12-10 DIAGNOSIS — I700 Atherosclerosis of aorta: Secondary | ICD-10-CM | POA: Diagnosis not present

## 2021-12-10 DIAGNOSIS — I480 Paroxysmal atrial fibrillation: Secondary | ICD-10-CM | POA: Diagnosis not present

## 2021-12-12 ENCOUNTER — Ambulatory Visit: Payer: PPO

## 2021-12-12 DIAGNOSIS — G20 Parkinson's disease: Secondary | ICD-10-CM | POA: Diagnosis not present

## 2021-12-12 DIAGNOSIS — R42 Dizziness and giddiness: Secondary | ICD-10-CM | POA: Diagnosis not present

## 2021-12-12 DIAGNOSIS — Z8673 Personal history of transient ischemic attack (TIA), and cerebral infarction without residual deficits: Secondary | ICD-10-CM | POA: Diagnosis not present

## 2021-12-12 DIAGNOSIS — I480 Paroxysmal atrial fibrillation: Secondary | ICD-10-CM | POA: Diagnosis not present

## 2021-12-12 DIAGNOSIS — Z8679 Personal history of other diseases of the circulatory system: Secondary | ICD-10-CM | POA: Diagnosis not present

## 2021-12-12 DIAGNOSIS — I729 Aneurysm of unspecified site: Secondary | ICD-10-CM | POA: Diagnosis not present

## 2021-12-12 DIAGNOSIS — S065X9D Traumatic subdural hemorrhage with loss of consciousness of unspecified duration, subsequent encounter: Secondary | ICD-10-CM | POA: Diagnosis not present

## 2021-12-13 ENCOUNTER — Other Ambulatory Visit: Payer: Self-pay | Admitting: Family Medicine

## 2021-12-13 NOTE — Telephone Encounter (Signed)
Requested medication (s) are due for refill today: yes  Requested medication (s) are on the active medication list: yes  Last refill:  09/19/21  #30 2 RF  Future visit scheduled: no  Notes to clinic:  med not delegated to NT to RF or REFUSE   Requested Prescriptions  Pending Prescriptions Disp Refills   zolpidem (AMBIEN) 10 MG tablet [Pharmacy Med Name: ZOLPIDEM TARTRATE 10 MG TABLET] 30 tablet 0    Sig: TAKE ONE-HALF TO ONE TABLET AT BEDTIME.     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

## 2021-12-22 ENCOUNTER — Ambulatory Visit: Payer: Self-pay

## 2021-12-23 ENCOUNTER — Ambulatory Visit: Payer: PPO

## 2021-12-26 ENCOUNTER — Other Ambulatory Visit: Payer: Self-pay

## 2021-12-26 ENCOUNTER — Ambulatory Visit (INDEPENDENT_AMBULATORY_CARE_PROVIDER_SITE_OTHER): Payer: PPO | Admitting: *Deleted

## 2021-12-26 DIAGNOSIS — C61 Malignant neoplasm of prostate: Secondary | ICD-10-CM | POA: Diagnosis not present

## 2021-12-26 MED ORDER — LEUPROLIDE ACETATE (6 MONTH) 45 MG ~~LOC~~ KIT
45.0000 mg | PACK | Freq: Once | SUBCUTANEOUS | Status: AC
Start: 2021-12-26 — End: 2021-12-26
  Administered 2021-12-26: 45 mg via SUBCUTANEOUS

## 2021-12-26 NOTE — Progress Notes (Signed)
Eligard SubQ Injection   Due to Prostate Cancer patient is present today for a Eligard Injection.  Medication: Eligard 6 month Dose: 45 mg  Location: left subcutaneous hip Lot: [id number] Exp: 7/24  Patient tolerated well, no complications were noted  Performed by: Verlene Mayer, CMA  Per Dr. Bernardo Heater patient is to continue therapy for 6 months . Patient's next follow up was scheduled for 06/26/22. This appointment was scheduled using wheel and given to patient today along with reminder continue on Vitamin D 800-1000iu and Calcium 1000-1200mg  daily while on Androgen Deprivation Therapy.  PA approval dates: No PA required

## 2021-12-27 LAB — PSA: Prostate Specific Ag, Serum: <0.1 ng/mL (ref 0.0–4.0)

## 2021-12-29 ENCOUNTER — Other Ambulatory Visit: Payer: Self-pay | Admitting: *Deleted

## 2021-12-29 DIAGNOSIS — C61 Malignant neoplasm of prostate: Secondary | ICD-10-CM

## 2022-01-01 ENCOUNTER — Telehealth: Payer: Self-pay | Admitting: Family Medicine

## 2022-01-01 NOTE — Telephone Encounter (Signed)
Copied from [place] [id number]. Topic: Medicare AWV >> Jan 01, 2022 11:38 AM Cher Nakai R wrote: Reason for CRM:  Left message for patient to call back and schedule Medicare Annual Wellness Visit (AWV) in office.   If not able to come in office, please offer to do virtually or by telephone.   Last AWV: 05/14/2020  Please schedule at anytime with [facility] Health Advisor.  If any questions, please contact me at [phone]

## 2022-01-04 IMAGING — CT CT HEAD W/O CM
3 series · 16 of 47 positions shown, 19 images · non-contrast
Comparison: CT head 06/25/2020

CLINICAL DATA: Follow-up CT scan for left parietal hematoma.

EXAM:
CT HEAD WITHOUT CONTRAST
TECHNIQUE: Contiguous axial images were obtained from the base of the skull
through the vertex without intravenous contrast.

[Series 3: head wo · axial · 0.46mm/px · z∈[-95,+35]mm · 10 of 32 slices shown, 13 images]
[im 3/32  brain]
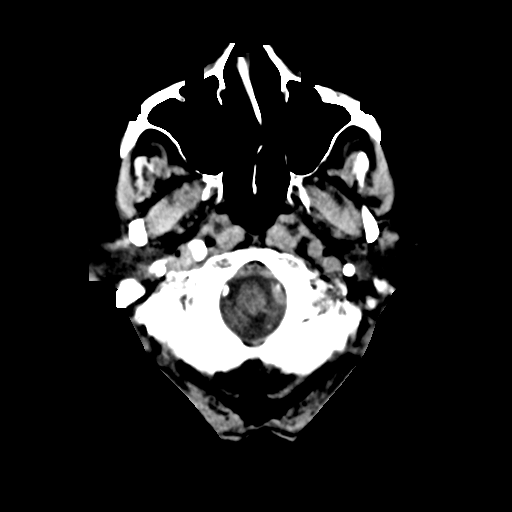
[im 3/32  bone]
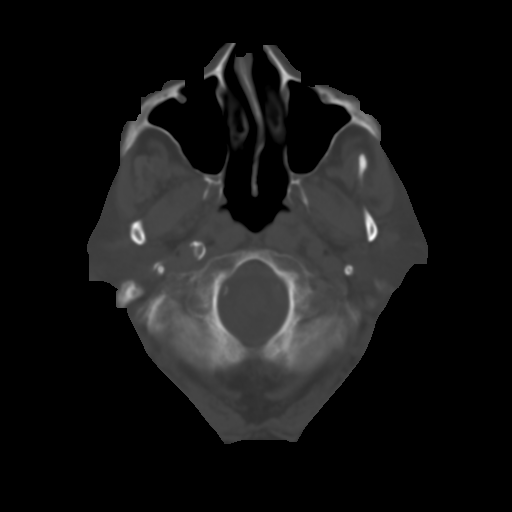
[im 6/32  brain]
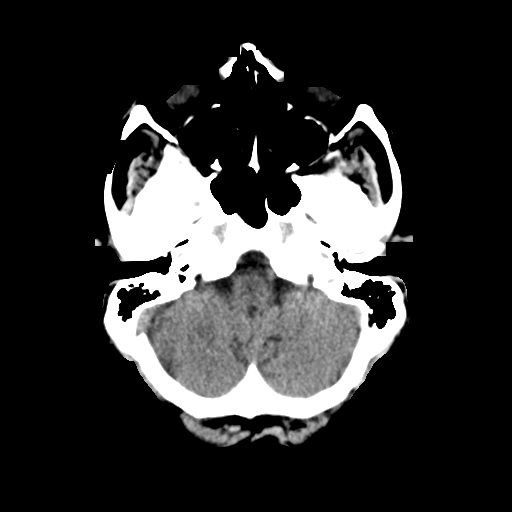
[im 9/32  brain]
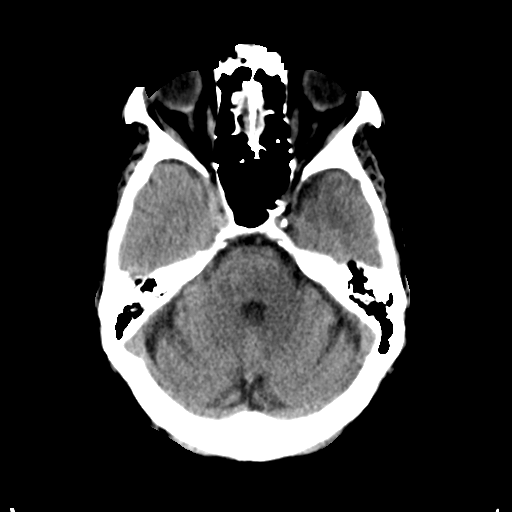
[im 11/32  brain]
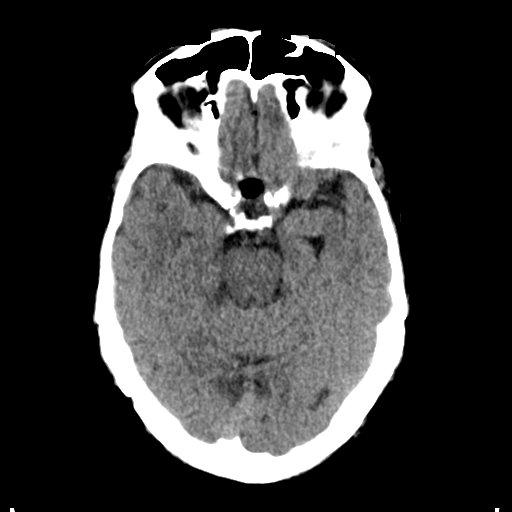
[im 14/32  brain]
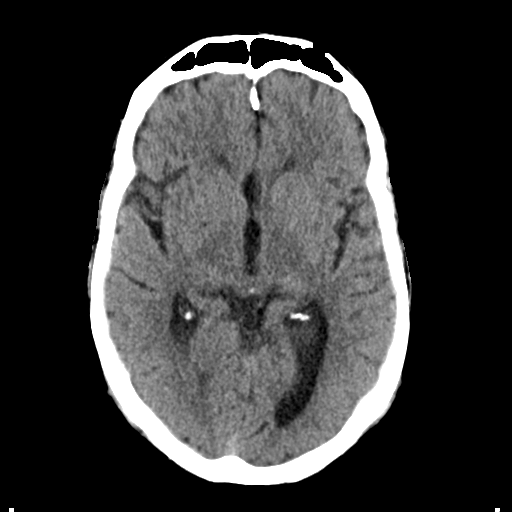
[im 14/32  bone]
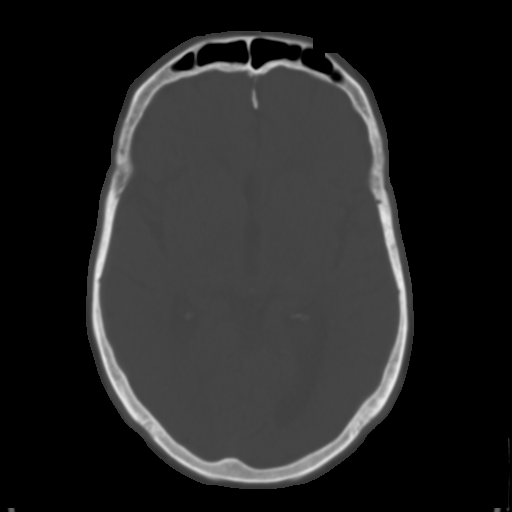
[im 18/32  brain]
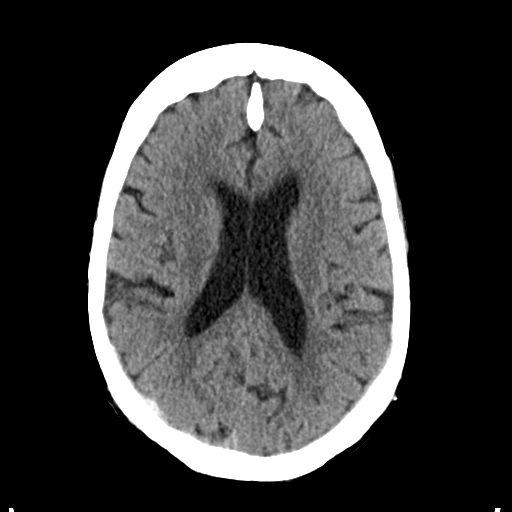
[im 21/32  brain]
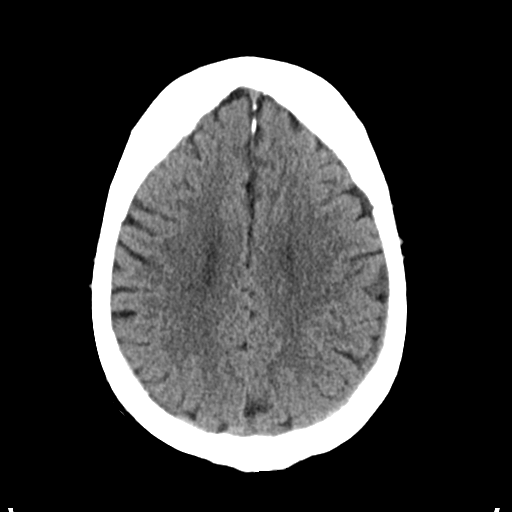
[im 24/32  brain]
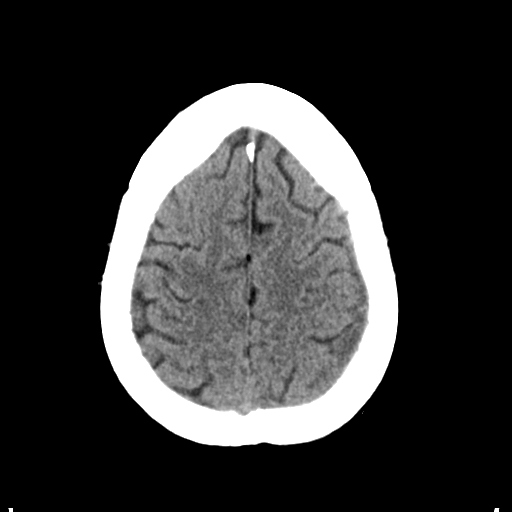
[im 26/32  brain]
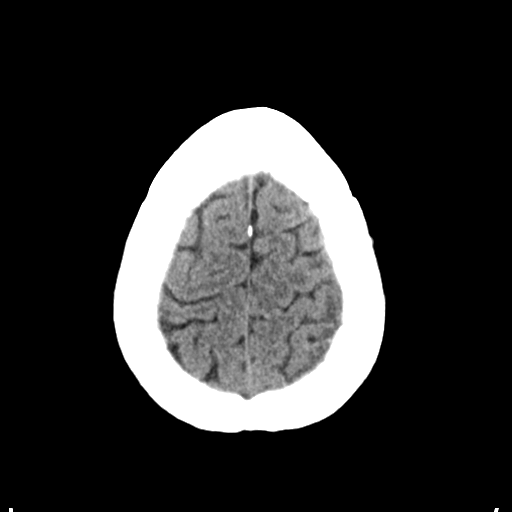
[im 26/32  bone]
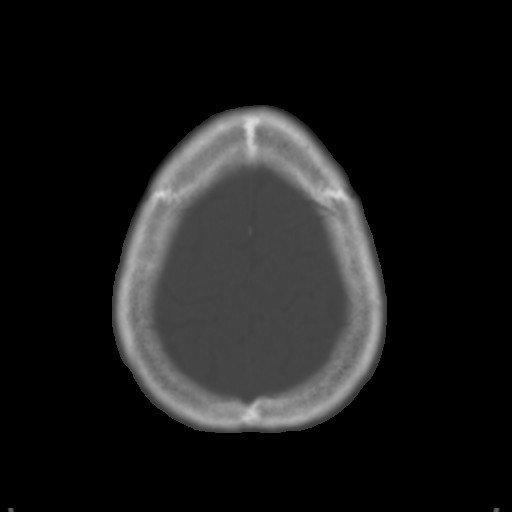
[im 29/32  brain]
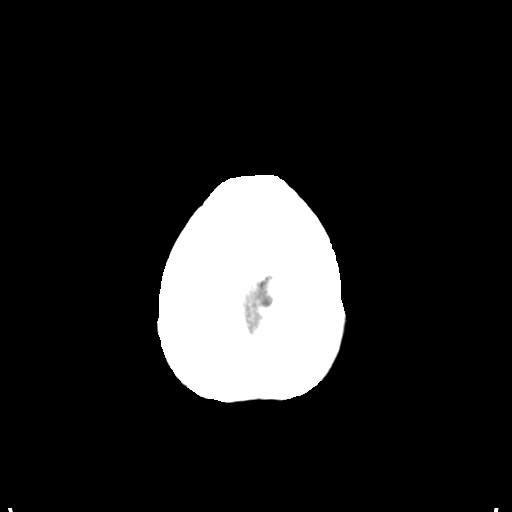

[Series 4: coronal soft tissue · coronal · 0.32mm/px · 3 of 73 slices shown]
[im 25/73  brain]
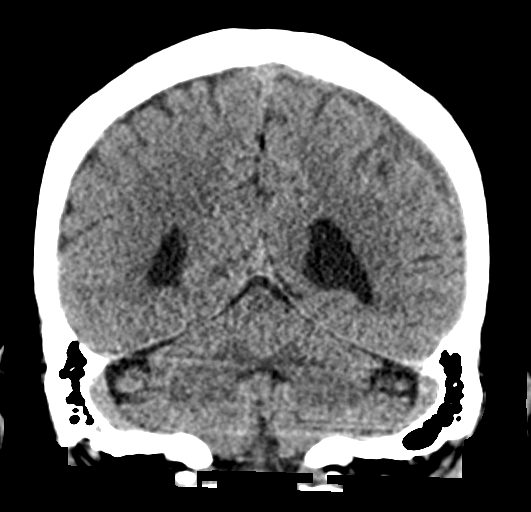
[im 33/73  brain]
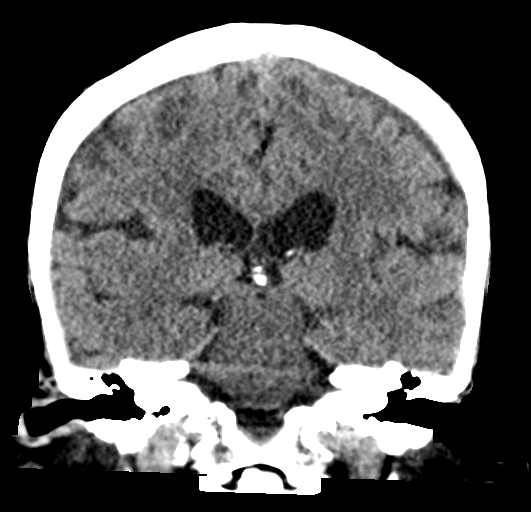
[im 41/73  brain]
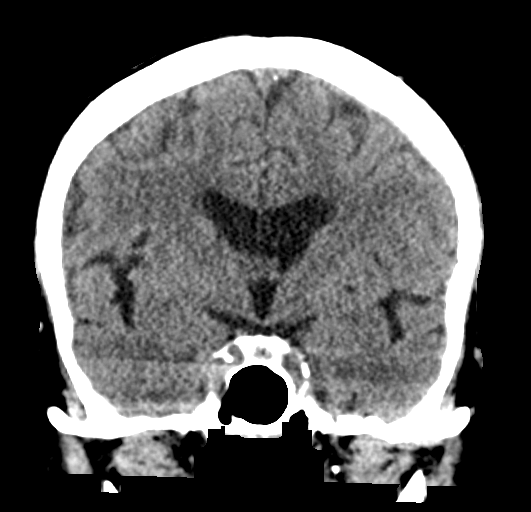

[Series 5: sagittal soft tissue · sagittal · 0.34mm/px · 3 of 59 slices shown]
[im 20/59  brain]
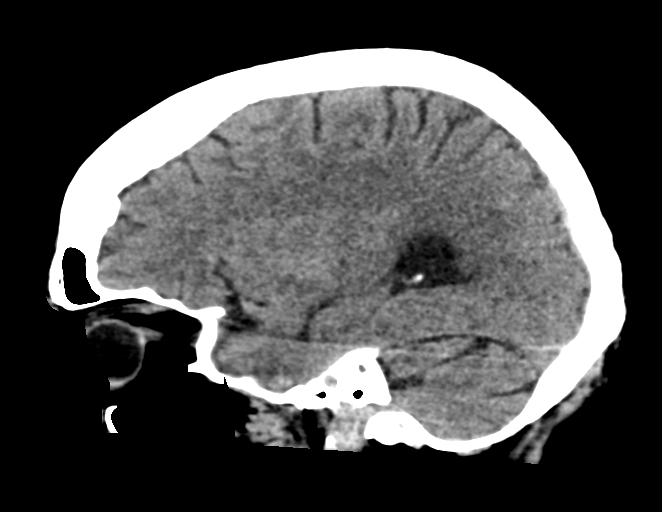
[im 30/59  brain]
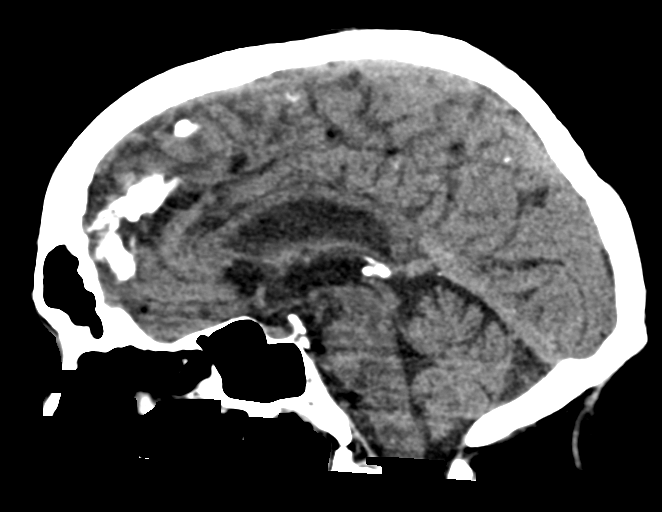
[im 39/59  brain]
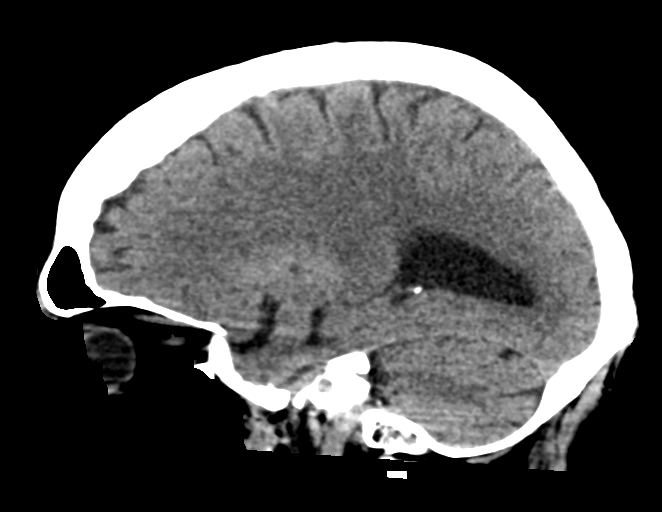

[16 of 47 positions shown; findings below may reference images not displayed]

FINDINGS: Brain: Stable left parietal low-density thin subdural hematoma
measuring 8 mm in thickness. Stable high density small right
posterior subdural hematoma measuring 4 mm. No mass effect or
midline shift. No evidence of acute infarction or hydrocephalus. No
evidence of mass lesion.

Vascular: No hyperdense vessel or unexpected calcification.

Skull: Normal. Negative for fracture or focal lesion.

Sinuses/Orbits: No acute finding.

Other: None.
IMPRESSION: Stable small left greater than right subdural hematomas.

## 2022-01-06 DIAGNOSIS — F1090 Alcohol use, unspecified, uncomplicated: Secondary | ICD-10-CM | POA: Diagnosis not present

## 2022-01-06 DIAGNOSIS — Z9181 History of falling: Secondary | ICD-10-CM | POA: Diagnosis not present

## 2022-01-06 DIAGNOSIS — I700 Atherosclerosis of aorta: Secondary | ICD-10-CM | POA: Diagnosis not present

## 2022-01-06 DIAGNOSIS — Z79818 Long term (current) use of other agents affecting estrogen receptors and estrogen levels: Secondary | ICD-10-CM | POA: Diagnosis not present

## 2022-01-06 DIAGNOSIS — D692 Other nonthrombocytopenic purpura: Secondary | ICD-10-CM | POA: Diagnosis not present

## 2022-01-06 DIAGNOSIS — I4891 Unspecified atrial fibrillation: Secondary | ICD-10-CM | POA: Diagnosis not present

## 2022-01-06 DIAGNOSIS — K7030 Alcoholic cirrhosis of liver without ascites: Secondary | ICD-10-CM | POA: Diagnosis not present

## 2022-01-06 DIAGNOSIS — D689 Coagulation defect, unspecified: Secondary | ICD-10-CM | POA: Diagnosis not present

## 2022-01-06 DIAGNOSIS — E119 Type 2 diabetes mellitus without complications: Secondary | ICD-10-CM | POA: Diagnosis not present

## 2022-01-06 DIAGNOSIS — C61 Malignant neoplasm of prostate: Secondary | ICD-10-CM | POA: Diagnosis not present

## 2022-01-09 ENCOUNTER — Other Ambulatory Visit: Payer: Self-pay | Admitting: Family Medicine

## 2022-01-12 ENCOUNTER — Ambulatory Visit (INDEPENDENT_AMBULATORY_CARE_PROVIDER_SITE_OTHER): Payer: PPO

## 2022-01-12 DIAGNOSIS — Z0000 Encounter for general adult medical examination without abnormal findings: Secondary | ICD-10-CM

## 2022-01-12 NOTE — Patient Instructions (Addendum)
Ian Sanders , Thank you for taking time to come for your Medicare Wellness Visit. I appreciate your ongoing commitment to your health goals. Please review the following plan we discussed and let me know if I can assist you in the future.   Screening recommendations/referrals: Colonoscopy: aged out Recommended yearly ophthalmology/optometry visit for glaucoma screening and checkup Recommended yearly dental visit for hygiene and checkup  Vaccinations: Influenza vaccine: 08/29/21 Pneumococcal vaccine: 08/17/15 Tdap vaccine: 05/29/16 Shingles vaccine: n/d   Covid-19: 11/17/19, 12/01/19, 12/22/19  Advanced directives: none  Conditions/risks identified: none  Next appointment: Follow up in one year for your annual wellness visit. 01/13/23 @ 1:40pm by phone  Preventive Care 65 Years and Older, Male Preventive care refers to lifestyle choices and visits with your health care provider that can promote health and wellness. What does preventive care include? A yearly physical exam. This is also called an annual well check. Dental exams once or twice a year. Routine eye exams. Ask your health care provider how often you should have your eyes checked. Personal lifestyle choices, including: Daily care of your teeth and gums. Regular physical activity. Eating a healthy diet. Avoiding tobacco and drug use. Limiting alcohol use. Practicing safe sex. Taking low doses of aspirin every day. Taking vitamin and mineral supplements as recommended by your health care provider. What happens during an annual well check? The services and screenings done by your health care provider during your annual well check will depend on your age, overall health, lifestyle risk factors, and family history of disease. Counseling  Your health care provider may ask you questions about your: Alcohol use. Tobacco use. Drug use. Emotional well-being. Home and relationship well-being. Sexual activity. Eating  habits. History of falls. Memory and ability to understand (cognition). Work and work Statistician. Screening  You may have the following tests or measurements: Height, weight, and BMI. Blood pressure. Lipid and cholesterol levels. These may be checked every 5 years, or more frequently if you are over 50 years old. Skin check. Lung cancer screening. You may have this screening every year starting at age 31 if you have a 30-pack-year history of smoking and currently smoke or have quit within the past 15 years. Fecal occult blood test (FOBT) of the stool. You may have this test every year starting at age 86. Flexible sigmoidoscopy or colonoscopy. You may have a sigmoidoscopy every 5 years or a colonoscopy every 10 years starting at age 18. Prostate cancer screening. Recommendations will vary depending on your family history and other risks. Hepatitis C blood test. Hepatitis B blood test. Sexually transmitted disease (STD) testing. Diabetes screening. This is done by checking your blood sugar (glucose) after you have not eaten for a while (fasting). You may have this done every 1-3 years. Abdominal aortic aneurysm (AAA) screening. You may need this if you are a current or former smoker. Osteoporosis. You may be screened starting at age 95 if you are at high risk. Talk with your health care provider about your test results, treatment options, and if necessary, the need for more tests. Vaccines  Your health care provider may recommend certain vaccines, such as: Influenza vaccine. This is recommended every year. Tetanus, diphtheria, and acellular pertussis (Tdap, Td) vaccine. You may need a Td booster every 10 years. Zoster vaccine. You may need this after age 24. Pneumococcal 13-valent conjugate (PCV13) vaccine. One dose is recommended after age 42. Pneumococcal polysaccharide (PPSV23) vaccine. One dose is recommended after age 86. Talk to your health care  provider about which screenings and  vaccines you need and how often you need them. This information is not intended to replace advice given to you by your health care provider. Make sure you discuss any questions you have with your health care provider. Document Released: 11/29/2015 Document Revised: 07/22/2016 Document Reviewed: 09/03/2015 Elsevier Interactive Patient Education  2017 [employer] Prevention in the Home Falls can cause injuries. They can happen to people of all ages. There are many things you can do to make your home safe and to help prevent falls. What can I do on the outside of my home? Regularly fix the edges of walkways and driveways and fix any cracks. Remove anything that might make you trip as you walk through a door, such as a raised step or threshold. Trim any bushes or trees on the path to your home. Use bright outdoor lighting. Clear any walking paths of anything that might make someone trip, such as rocks or tools. Regularly check to see if handrails are loose or broken. Make sure that both sides of any steps have handrails. Any raised decks and porches should have guardrails on the edges. Have any leaves, snow, or ice cleared regularly. Use sand or salt on walking paths during winter. Clean up any spills in your garage right away. This includes oil or grease spills. What can I do in the bathroom? Use night lights. Install grab bars by the toilet and in the tub and shower. Do not use towel bars as grab bars. Use non-skid mats or decals in the tub or shower. If you need to sit down in the shower, use a plastic, non-slip stool. Keep the floor dry. Clean up any water that spills on the floor as soon as it happens. Remove soap buildup in the tub or shower regularly. Attach bath mats securely with double-sided non-slip rug tape. Do not have throw rugs and other things on the floor that can make you trip. What can I do in the bedroom? Use night lights. Make sure that you have a light by your  bed that is easy to reach. Do not use any sheets or blankets that are too big for your bed. They should not hang down onto the floor. Have a firm chair that has side arms. You can use this for support while you get dressed. Do not have throw rugs and other things on the floor that can make you trip. What can I do in the kitchen? Clean up any spills right away. Avoid walking on wet floors. Keep items that you use a lot in easy-to-reach places. If you need to reach something above you, use a strong step stool that has a grab bar. Keep electrical cords out of the way. Do not use floor polish or wax that makes floors slippery. If you must use wax, use non-skid floor wax. Do not have throw rugs and other things on the floor that can make you trip. What can I do with my stairs? Do not leave any items on the stairs. Make sure that there are handrails on both sides of the stairs and use them. Fix handrails that are broken or loose. Make sure that handrails are as long as the stairways. Check any carpeting to make sure that it is firmly attached to the stairs. Fix any carpet that is loose or worn. Avoid having throw rugs at the top or bottom of the stairs. If you do have throw rugs, attach them to the  floor with carpet tape. Make sure that you have a light switch at the top of the stairs and the bottom of the stairs. If you do not have them, ask someone to add them for you. What else can I do to help prevent falls? Wear shoes that: Do not have high heels. Have rubber bottoms. Are comfortable and fit you well. Are closed at the toe. Do not wear sandals. If you use a stepladder: Make sure that it is fully opened. Do not climb a closed stepladder. Make sure that both sides of the stepladder are locked into place. Ask someone to hold it for you, if possible. Clearly mark and make sure that you can see: Any grab bars or handrails. First and last steps. Where the edge of each step is. Use tools that  help you move around (mobility aids) if they are needed. These include: Canes. Walkers. Scooters. Crutches. Turn on the lights when you go into a dark area. Replace any light bulbs as soon as they burn out. Set up your furniture so you have a clear path. Avoid moving your furniture around. If any of your floors are uneven, fix them. If there are any pets around you, be aware of where they are. Review your medicines with your doctor. Some medicines can make you feel dizzy. This can increase your chance of falling. Ask your doctor what other things that you can do to help prevent falls. This information is not intended to replace advice given to you by your health care provider. Make sure you discuss any questions you have with your health care provider. Document Released: 08/29/2009 Document Revised: 04/09/2016 Document Reviewed: 12/07/2014 Elsevier Interactive Patient Education  2017 [employer].

## 2022-01-12 NOTE — Progress Notes (Signed)
Virtual Visit via Telephone Note  I connected with  Ian Sanders on 01/12/22 at  1:40 PM EST by telephone and verified that I am speaking with the correct person using two identifiers.  Location: Patient: home Provider: [facility] Persons participating in the virtual visit: [place]   I discussed the limitations, risks, security and privacy concerns of performing an evaluation and management service by telephone and the availability of in person appointments. The patient expressed understanding and agreed to proceed.  Interactive audio and video telecommunications were attempted between this nurse and patient, however failed, due to patient having technical difficulties OR patient did not have access to video capability.  We continued and completed visit with audio only.  Some vital signs may be absent or patient reported.   Dionisio David, LPN  Subjective:   Ian Sanders is a 81 y.o. male who presents for Medicare Annual/Subsequent preventive examination.  Review of Systems           Objective:    There were no vitals filed for this visit. There is no height or weight on file to calculate BMI.  Advanced Directives 09/19/2021 07/12/2021 05/20/2021 09/27/2020 09/16/2020 09/12/2020 06/26/2020  Does Patient Have a Medical Advance Directive? Yes No Yes Yes Yes Yes Yes  Type of Paramedic of [place];Living will - [place];Living will [place];Living will - [place];Living will [place];Living will  Does patient want to make changes to medical advance directive? No - Patient declined - No - Patient declined No - Patient declined - - No - Patient declined  Copy of [place] in Chart? - - No - copy requested No - copy requested - - -  Would patient like information on creating a medical advance directive? - No - Patient declined - - - - -    Current  Medications (verified) Outpatient Encounter Medications as of 01/12/2022  Medication Sig   calcium-vitamin D (OSCAL) 250-125 MG-UNIT per tablet Take 1 tablet by mouth 2 (two) times daily.    chlorthalidone (HYGROTON) 25 MG tablet Take 1 tablet (25 mg total) by mouth daily.   Cyanocobalamin (VITAMIN B-12 PO) Take 1 tablet by mouth daily.   cyclobenzaprine (FLEXERIL) 10 MG tablet Take 0.5 tablets (5 mg total) by mouth 2 (two) times daily as needed.   Ferrous Sulfate 134 MG TABS Take 1 tablet by mouth daily.   flecainide (TAMBOCOR) 50 MG tablet Take 1 tablet (50 mg total) by mouth 2 (two) times daily.   gabapentin (NEURONTIN) 100 MG capsule Take 1 capsule (100 mg total) by mouth 3 (three) times daily.   lactulose (CHRONULAC) 10 GM/15ML solution Take 30 mLs (20 g total) by mouth 3 (three) times daily.   Leuprolide Acetate, 6 Month, (LUPRON) 45 MG injection Inject 45 mg into the muscle every 6 (six) months.   losartan (COZAAR) 25 MG tablet Take 25 mg by mouth 2 (two) times daily.   omeprazole (PRILOSEC) 40 MG capsule TAKE ONE CAPSULE DAILY. (Patient taking differently: Take 40 mg by mouth daily.)   Potassium 99 MG TABS Take by mouth in the morning and at bedtime.   propranolol (INDERAL) 10 MG tablet Take 10 mg by mouth 2 (two) times daily.   rifaximin (XIFAXAN) 550 MG TABS tablet Take 1 tablet (550 mg total) by mouth 2 (two) times daily.   traMADol (ULTRAM) 50 MG tablet Take 1 tablet (50 mg total)  by mouth every 6 (six) hours as needed.   Vibegron (GEMTESA) 75 MG TABS Take 75 mg by mouth daily.   Vitamin A 2400 MCG (8000 UT) CAPS Take by mouth.   zolpidem (AMBIEN) 10 MG tablet TAKE ONE-HALF TO ONE TABLET AT BEDTIME.   No facility-administered encounter medications on file as of 01/12/2022.    Allergies (verified) Lisinopril, Myrbetriq [mirabegron], Other, Triamterene, Amlodipine, Atorvastatin, Dabigatran etexilate mesylate, and Dabigatran etexilate mesylate   History: Past Medical History:   Diagnosis Date   Atrial fibrillation (HCC)    Basal cell carcinoma    Benign paroxysmal positional vertigo    Cancer (HCC)    History of chicken pox    History of measles    History of mumps    Hypertension    Mononeuritis of unspecified site    Other seborrheic keratosis    Stroke ([place]) 06/2020   Transient ischemic attack (TIA), and cerebral infarction without residual deficits(V12.54)    Traumatic subdural hematoma without loss of consciousness ([place]) 06/25/2020   Unspecified hypothyroidism    Past Surgical History:  Procedure Laterality Date   Carotid Doppler Ultrasound  04/02/2010   Small amount Calcified plaque bilaterally, no significant stenosis.   CATARACT EXTRACTION W/PHACO Right 04/15/2021   Procedure: CATARACT EXTRACTION PHACO AND INTRAOCULAR LENS PLACEMENT ([place]) RIGHT;  Surgeon: Birder Robson, MD;  Location: [facility];  Service: Ophthalmology;  Laterality: Right;  7.98 0:47.2   CATARACT EXTRACTION W/PHACO Left 05/20/2021   Procedure: CATARACT EXTRACTION PHACO AND INTRAOCULAR LENS PLACEMENT ([facility]) LEFT 3.37 00:32.0;  Surgeon: Birder Robson, MD;  Location: [facility];  Service: Ophthalmology;  Laterality: Left;  Requests arrival 10A or after   COLONOSCOPY  05/11/2014   Tubular Adenoma. Dr. Allen Norris   COLONOSCOPY WITH PROPOFOL N/A 05/23/2018   Procedure: COLONOSCOPY WITH PROPOFOL;  Surgeon: [name], MD;  Location[facility] ENDOSCOPY;  Service: Gastroenterology;  Laterality: N/A;   DOPPLER ECHOCARDIOGRAPHY  04/02/2010   Mild left atrial dilation. Normal right atrium. No valvular disease. No thrombus. Normal LV function. EF>55%   ESOPHAGOGASTRODUODENOSCOPY (EGD) WITH PROPOFOL N/A 08/16/2019   Procedure: ESOPHAGOGASTRODUODENOSCOPY (EGD) WITH PROPOFOL;  Surgeon: [name], MD;  Location[facility] ENDOSCOPY;  Service: Gastroenterology;  Laterality: N/A;   FINGER AMPUTATION     HERNIA REPAIR     MRI Brain with and without contrast  03/20/2010   Suggestive of basal  ganglia lacunar infarct   PROSTATECTOMY  1995   Abdominal   SKIN SURGERY     multiple   Thumb surgery     Family History  Problem Relation Age of Onset   Heart disease Mother 54       CABG   Hypertension Mother    Heart attack Mother    Hypertension Brother    Heart disease Sister        stents   Prostate cancer Brother    Kidney cancer Brother    Skin cancer Brother    Hypertension Brother    Bladder Cancer Sister    Leukemia Father    Stroke Father        multiple   Cerebral aneurysm Sister    Kidney cancer Brother    Social History   Socioeconomic History   Marital status: Married    Spouse name: Not on file   Number of children: 2   Years of education: Not on file   Highest education level: Associate degree: occupational, Hotel manager, or vocational program  Occupational History   Occupation: Retired  Tobacco Use   Smoking status: Former    Packs/day: 0.00    Years: 12.00    Pack years: 0.00    Types: Cigarettes   Smokeless tobacco: Current    Types: Chew   Tobacco comments:    quit over 45 years ago  Vaping Use   Vaping Use: Never used  Substance and Sexual Activity   Alcohol use: Not Currently    Alcohol/week: 5.0 standard drinks    Types: 5 Cans of beer per week    Comment: quit drinking summer 2021   Drug use: No   Sexual activity: Not on file  Other Topics Concern   Not on file  Social History Narrative   Lives with wife at home ; alcohol abuse; quit smoking 2016; 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 bring in CBG monitor from home?: No  How often do you need to have someone help you when you read instructions, pamphlets, or other written materials from your doctor or pharmacy?: 1 - Never  Diabetic?yes Nutrition Risk Assessment:  Has the patient had any N/V/D within the last 2 months?  No  Does the patient have any non-healing wounds?  No  Has the patient had any unintentional weight loss or weight gain?  No   Diabetes:  Is the patient diabetic?  Yes  If diabetic, was a CBG obtained today?  No  Did the patient bring in their glucometer from home?  No  How often do you monitor your CBG's? never.   Financial Strains and Diabetes Management:  Are you having any financial strains with the device, your supplies or your medication? No .  Does the patient want to be seen by Chronic Care Management for management of their diabetes?  No  Would the patient like to be referred to a Nutritionist or for Diabetic Management?  No   Diabetic Exams:  Diabetic Eye Exam: Completed 06/26/19. Has appointment on Friday. Overdue for diabetic eye exam. Pt has been advised about the importance in completing this exam.   Diabetic Foot Exam: Completed 04/27/17. Pt has been advised about the importance in completing this exam.   Interpreter Needed?: No  Information entered by :: Kirke Shaggy, LPN   Activities of Daily Living In your present state of health, do you have any difficulty performing the following activities: 11/21/2021 05/20/2021  Hearing? N N  Vision? N N  Difficulty concentrating or making decisions? Y N  Walking or climbing stairs? Y N  Dressing or bathing? Y N  Doing errands, shopping? Y -  Some recent data might be hidden    Patient Care Team: Birdie Sons, MD as PCP - General (Family Medicine) Laneta Simmers as Physician Assistant (Urology) Isaias Cowman, MD as Consulting Physician  (Cardiology) Ree Edman, MD (Dermatology) [place], [place], Elisha Headland, MD as Consulting Physician (Hematology and Oncology)  Indicate any recent Medical Services you may have received from other  than [facility] providers in the past year (date may be approximate).     Assessment:   This is a routine wellness examination for Ian Sanders.  Hearing/Vision screen No results found.  Dietary issues and exercise activities discussed:     Goals Addressed   None    Depression Screen PHQ 2/9 Scores 11/21/2021 03/17/2021 05/14/2020 05/09/2019 05/09/2019 05/02/2018 05/02/2018  PHQ - 2 Score 0 0 0 0 0 1 1  PHQ- 9 Score 5 1 - 5 - 6 -    Fall Risk Fall Risk  11/21/2021 03/17/2021 05/14/2020 05/09/2019 05/02/2018  Falls in the past year? 1 0 0 1 Yes  Number falls in past yr: 0 0 0 1 1  Injury with Fall? 0 0 0 0 No  Risk for fall due to : No Fall Risks No Fall Risks - - -  Follow up Falls evaluation completed Falls evaluation completed - Falls prevention discussed Falls prevention discussed    FALL RISK PREVENTION PERTAINING TO THE HOME:  Any stairs in or around the home? Yes  If so, are there any without handrails? No  Home free of loose throw rugs in walkways, pet beds, electrical cords, etc? Yes  Adequate lighting in your home to reduce risk of falls? Yes   ASSISTIVE DEVICES UTILIZED TO PREVENT FALLS:  Life alert? No  Use of a cane, walker or w/c? Yes  Grab bars in the bathroom? Yes  Shower chair or bench in shower? Yes  Elevated toilet seat or a handicapped toilet? Yes   Cognitive Function:     6CIT Screen 05/02/2018 04/20/2017  What Year? 0 points 0 points  What month? 0 points 0 points  What time? 0 points 0 points  Count back from 20 0 points 0 points  Months in reverse 2 points 2 points  Repeat phrase 4 points 0 points  Total Score 6 2    Immunizations Immunization History  Administered Date(s) Administered   Fluad Quad(high Dose 65+) 08/19/2020, 08/29/2021    Hep A / Hep B 08/24/2019   Hepatitis A, Adult 08/19/2020   Hepb-cpg 08/19/2020   Influenza, High Dose Seasonal PF 08/09/2015, 07/23/2016, 08/26/2018, 10/02/2019   Influenza-Unspecified 10/29/2004, 08/25/2006, 10/19/2007, 07/31/2010, 08/16/2014, 08/25/2017   PFIZER(Purple Top)SARS-COV-2 Vaccination 11/17/2019, 12/01/2019, 12/22/2019   Pneumococcal Conjugate-13 12/15/2013, 08/17/2015   Pneumococcal Polysaccharide-23 11/16/2006, 10/19/2007   Td 05/29/2016   Tdap 11/17/2011    TDAP status: Up to date  Flu Vaccine status: Up to date  Pneumococcal vaccine status: Up to date  Covid-19 vaccine status: Completed vaccines  Qualifies for Shingles Vaccine? Yes   Zostavax completed No   Shingrix Completed?: No.    Education has been provided regarding the importance of this vaccine. Patient has been advised to call insurance company to determine out of pocket expense if they have not yet received this vaccine. Advised may also receive vaccine at local pharmacy or Health Dept. Verbalized acceptance and understanding.  Screening Tests Health Maintenance  Topic Date Due   Zoster Vaccines- Shingrix (1 of 2) Never done   FOOT EXAM  04/27/2018   URINE MICROALBUMIN  04/27/2018   COVID-19 Vaccine (4 - Booster) 02/16/2020   OPHTHALMOLOGY EXAM  06/25/2020   HEMOGLOBIN A1C  01/13/2022   COLONOSCOPY (Pts 45-26yrs Insurance coverage will need to be confirmed)  05/24/2023   TETANUS/TDAP  05/29/2026   Pneumonia Vaccine 76+ Years old  Completed   INFLUENZA VACCINE  Completed   HPV VACCINES  Aged Out    Health Maintenance  Health Maintenance Due  Topic Date Due   Zoster Vaccines- Shingrix (1 of 2) Never done   FOOT EXAM  04/27/2018   URINE MICROALBUMIN  04/27/2018   COVID-19 Vaccine (4 - Booster) 02/16/2020   OPHTHALMOLOGY EXAM  06/25/2020    Colorectal cancer screening: No longer required.   Lung Cancer Screening: (Low Dose CT Chest recommended if Age 34-80 years, 30 pack-year currently  smoking OR have quit w/in 15years.) does not qualify.    Additional Screening:  Hepatitis C Screening: does not qualify; Completed no  Vision Screening: Recommended annual ophthalmology exams for early detection of glaucoma and other disorders of the eye. Is the patient up to date with their annual eye exam?  Yes  Who is the provider or what is the name of the office in which the patient attends annual eye exams? [facility] If pt is not established with a provider, would they like to be referred to a provider to establish care? No .   Dental Screening: Recommended annual dental exams for proper oral hygiene  Community Resource Referral / Chronic Care Management: CRR required this visit?  No   CCM required this visit?  No      Plan:     I have personally reviewed and noted the following in the patients chart:   Medical and social history Use of alcohol, tobacco or illicit drugs  Current medications and supplements including opioid prescriptions. Patient is not currently taking opioid prescriptions. Functional ability and status Nutritional status Physical activity Advanced directives List of other physicians Hospitalizations, surgeries, and ER visits in previous 12 months Vitals Screenings to include cognitive, depression, and falls Referrals and appointments  In addition, I have reviewed and discussed with patient certain preventive protocols, quality metrics, and best practice recommendations. A written personalized care plan for preventive services as well as general preventive health recommendations were provided to patient.     Dionisio David, LPN   01/14/6009   Nurse Notes: none

## 2022-01-16 DIAGNOSIS — H353211 Exudative age-related macular degeneration, right eye, with active choroidal neovascularization: Secondary | ICD-10-CM | POA: Diagnosis not present

## 2022-02-13 DIAGNOSIS — H353211 Exudative age-related macular degeneration, right eye, with active choroidal neovascularization: Secondary | ICD-10-CM | POA: Diagnosis not present

## 2022-03-06 DIAGNOSIS — D692 Other nonthrombocytopenic purpura: Secondary | ICD-10-CM | POA: Diagnosis not present

## 2022-03-06 DIAGNOSIS — E119 Type 2 diabetes mellitus without complications: Secondary | ICD-10-CM | POA: Diagnosis not present

## 2022-03-06 DIAGNOSIS — Z9181 History of falling: Secondary | ICD-10-CM | POA: Diagnosis not present

## 2022-03-06 DIAGNOSIS — I10 Essential (primary) hypertension: Secondary | ICD-10-CM | POA: Diagnosis not present

## 2022-03-06 DIAGNOSIS — I480 Paroxysmal atrial fibrillation: Secondary | ICD-10-CM | POA: Diagnosis not present

## 2022-03-06 IMAGING — US US ABDOMEN COMPLETE
1 series · 13 of 25 positions shown · non-contrast
Comparison: May 26, 2019

CLINICAL DATA: Abdominal distension

EXAM:
ABDOMEN ULTRASOUND COMPLETE

[Series 1: us abdomen complete · 13 of 97 slices shown]
[im 1/97]
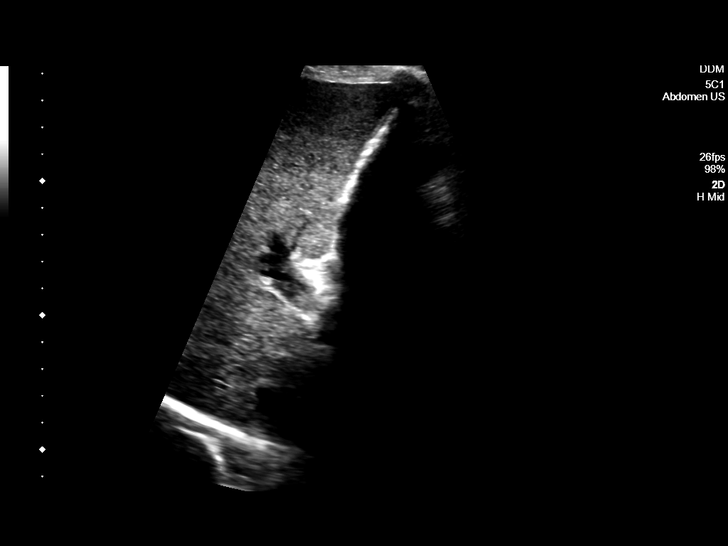
[im 9/97]
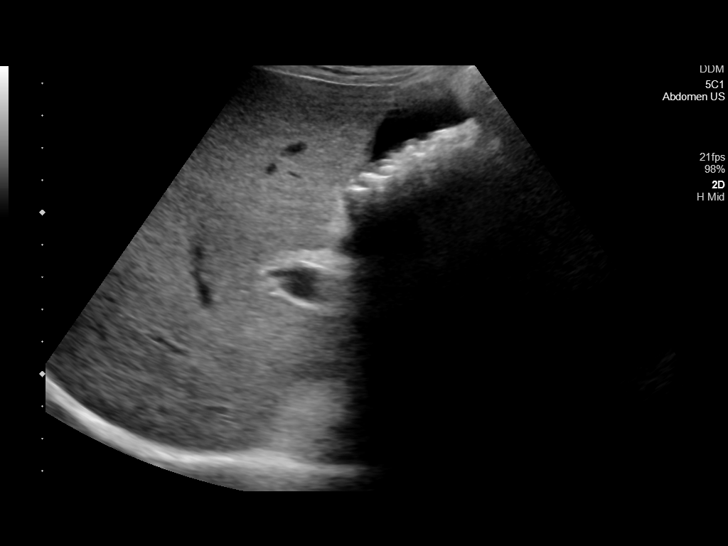
[im 17/97]
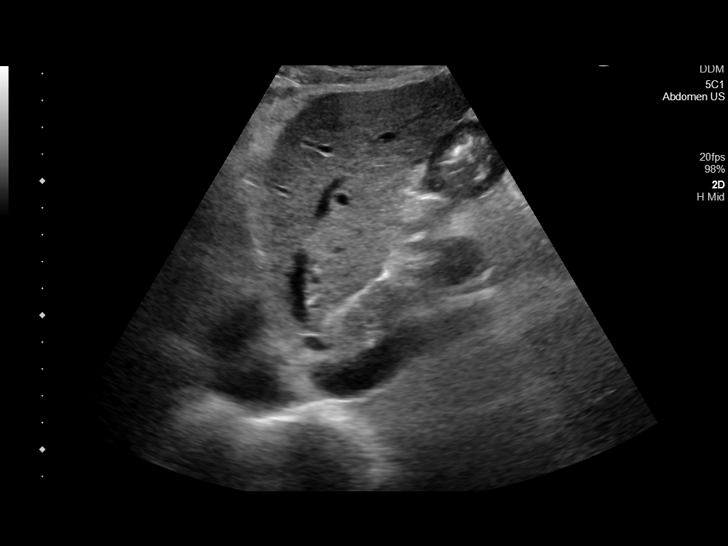
[im 25/97]
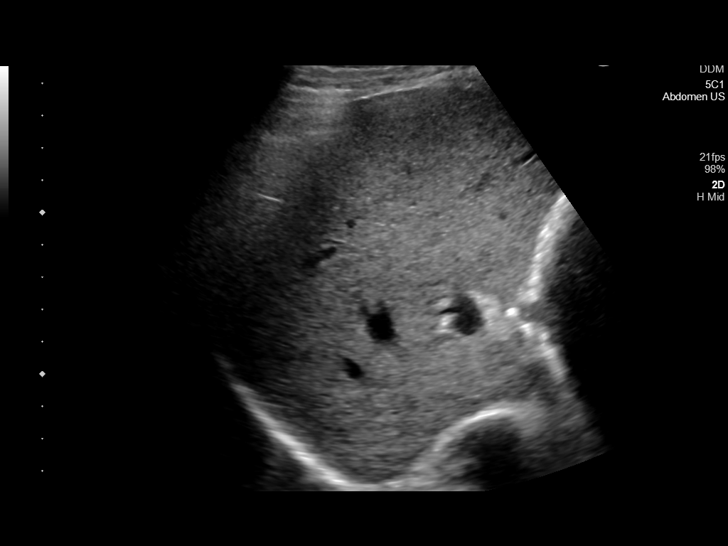
[im 33/97]
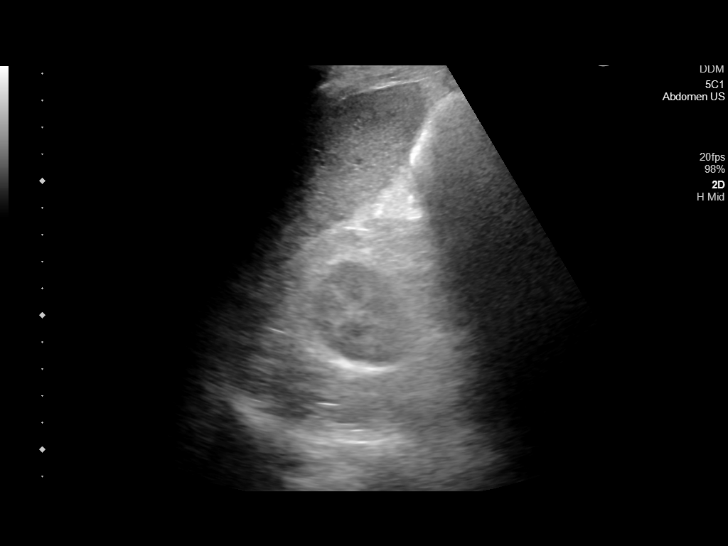
[im 41/97]
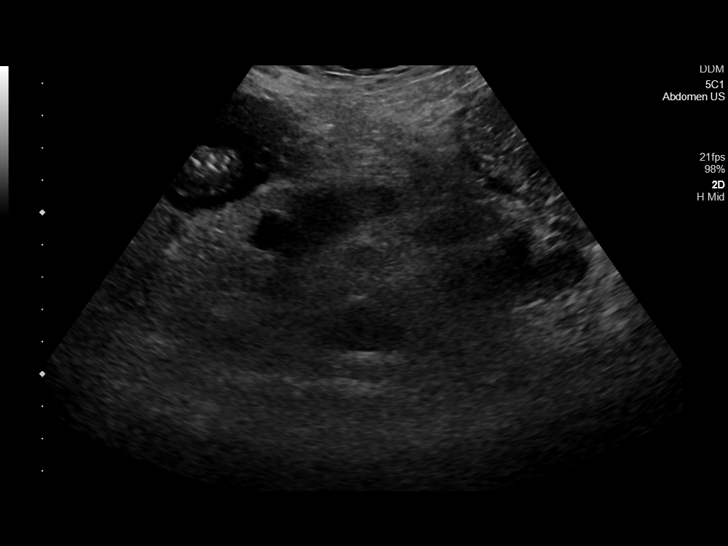
[im 49/97]
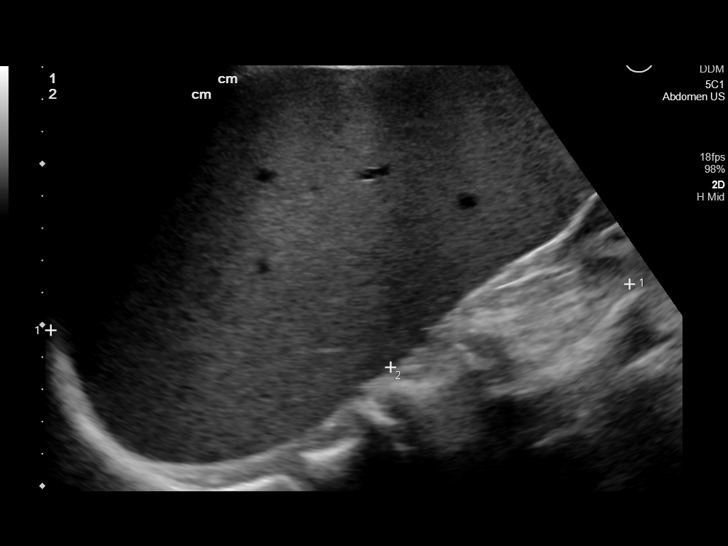
[im 57/97]
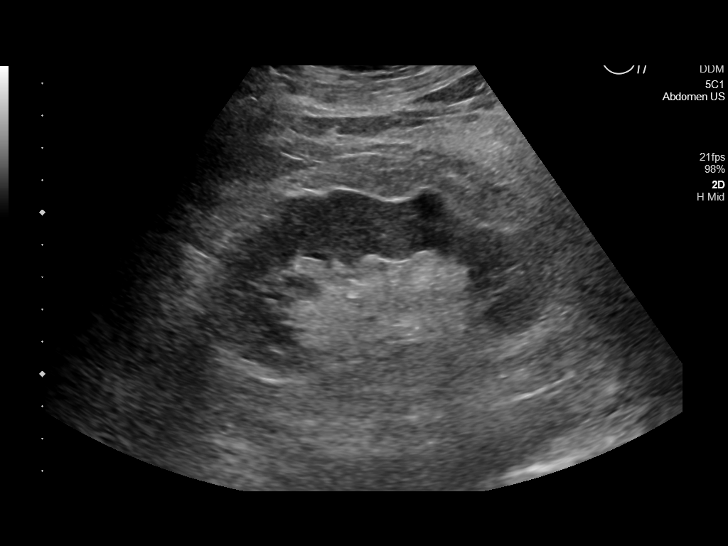
[im 65/97]
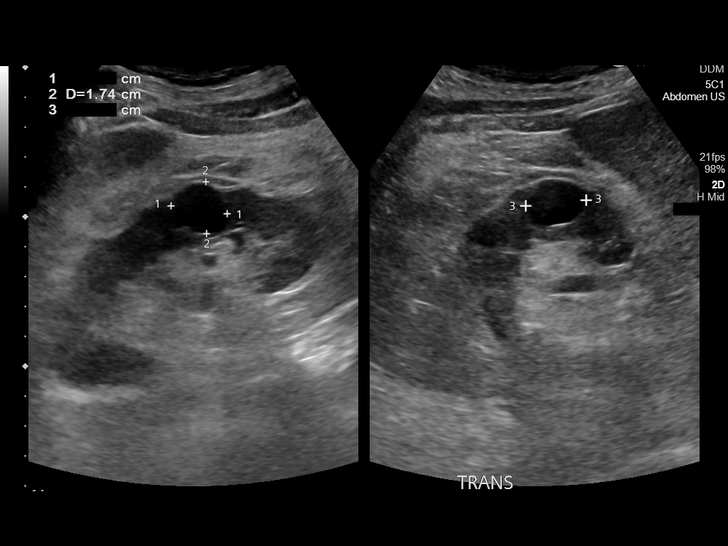
[im 73/97]
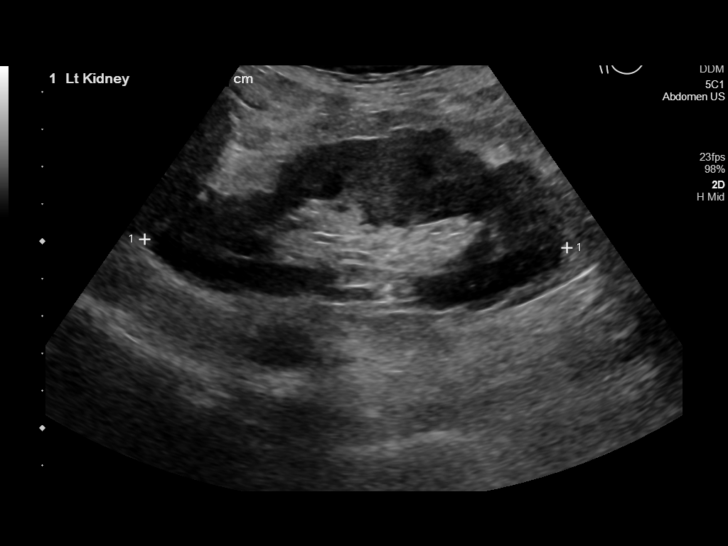
[im 81/97]
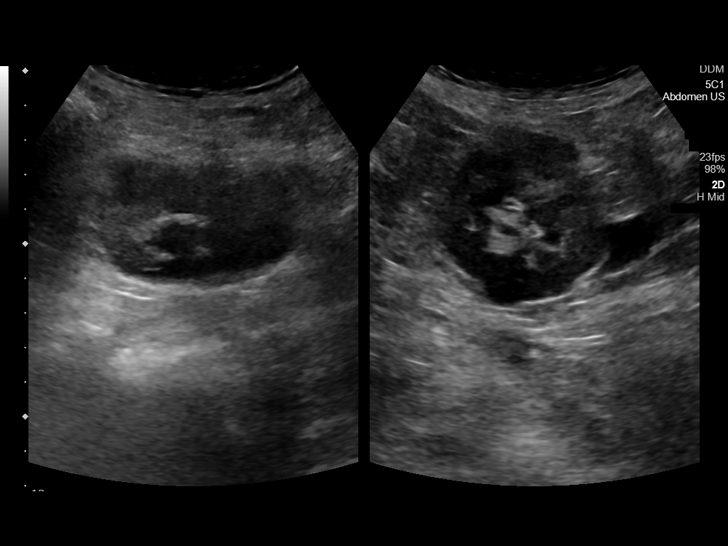
[im 89/97]
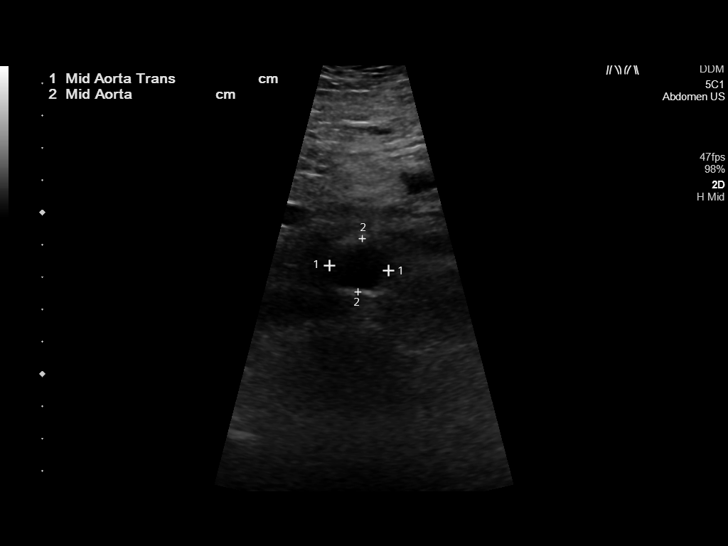
[im 97/97]
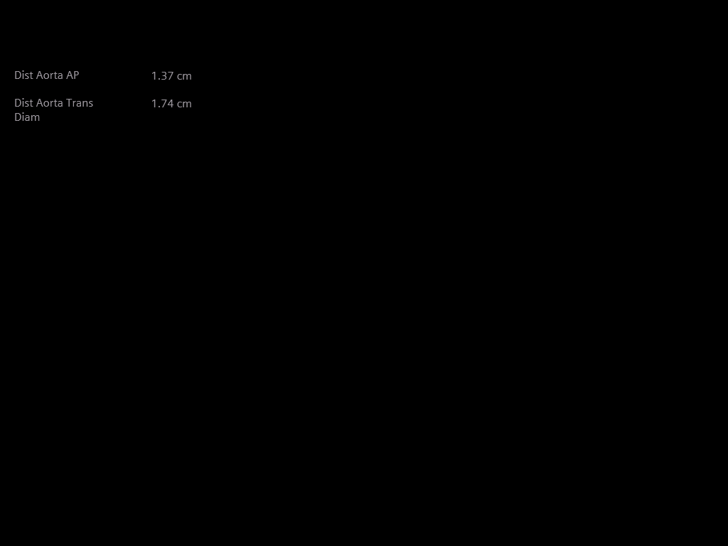

[13 of 25 positions shown; findings below may reference images not displayed]

FINDINGS: Gallbladder: Within the gallbladder, there are multiple echogenic
foci which move and shadow consistent with cholelithiasis. Largest
gallstone measures 5 mm in length. No gallbladder wall thickening or
pericholecystic fluid. No sonographic Murphy sign noted by
sonographer.

Common bile duct: Diameter: 6 mm. No intrahepatic, common hepatic,
or common bile duct dilatation.

Liver: No focal lesion identified. Liver echogenicity is
inhomogeneous and overall increased. Portal vein is patent on color
Doppler imaging with normal direction of blood flow towards the
liver.

IVC: No abnormality visualized.

Pancreas: No pancreatic mass or inflammatory focus.

Spleen: Spleen measures 18.8 x 18.7 x 11.1 cm with a measured
splenic volume of 1,952 cubic cm. No focal splenic lesion or
perisplenic fluid evident.

Right Kidney: Length: 11.3 cm. Echogenicity within normal limits. No
hydronephrosis visualized. There is a cyst in the mid right kidney
measuring 1.4 x 1.3 x 1.2 cm. In the mid to lower pole region on the
right, there is a cyst measuring 1.9 x 2.0 x 1.7 cm.

Left Kidney: Length: 11.3 cm. Echogenicity within normal limits. No
hydronephrosis visualized. There is a cyst in the mid left kidney
measuring 2.5 x 2.4 x 2.3 cm. There are cysts more inferiorly
measuring 1.4 x 1.3 x 1.1 cm 1.0 x 1.0 x 0.8 cm respectively.

Abdominal aorta: No aneurysm visualized.

Other findings: No demonstrable ascites.
IMPRESSION: 1. Cholelithiasis. No gallbladder wall thickening or pericholecystic
fluid.

2. Splenomegaly again noted with measured splenic volume of 1,952
cubic cm. No focal splenic lesions.

3. Liver echogenicity overall increased and somewhat inhomogeneous,
likely due to hepatic steatosis with underlying parenchymal liver
disease. No focal liver lesions are demonstrable on this study.

4.  Simple cysts in each kidney.

## 2022-03-09 DIAGNOSIS — L570 Actinic keratosis: Secondary | ICD-10-CM | POA: Diagnosis not present

## 2022-03-09 DIAGNOSIS — Z85828 Personal history of other malignant neoplasm of skin: Secondary | ICD-10-CM | POA: Diagnosis not present

## 2022-03-09 DIAGNOSIS — Z08 Encounter for follow-up examination after completed treatment for malignant neoplasm: Secondary | ICD-10-CM | POA: Diagnosis not present

## 2022-03-09 DIAGNOSIS — D225 Melanocytic nevi of trunk: Secondary | ICD-10-CM | POA: Diagnosis not present

## 2022-03-09 DIAGNOSIS — L821 Other seborrheic keratosis: Secondary | ICD-10-CM | POA: Diagnosis not present

## 2022-03-11 ENCOUNTER — Telehealth: Payer: Self-pay | Admitting: *Deleted

## 2022-03-11 NOTE — Telephone Encounter (Signed)
Tried returning call. Left detailed message on voice message system.  ?

## 2022-03-11 NOTE — Telephone Encounter (Signed)
That's fine

## 2022-03-11 NOTE — Telephone Encounter (Signed)
Copied from [place] [phone]. Topic: Quick Communication - Home Health Verbal Orders ?>> Mar 10, 2022  2:07 PM McGill, Nelva Bush wrote: ?Caller/Agency: Ethelene Browns Nelson//[facility] ?Callback Number: [phone] ?Requesting OT/PT/Skilled Nursing/Social Work/Speech Therapy: PT in home for deconditioning and balance  ?Frequency: N/A ?

## 2022-03-13 DIAGNOSIS — H353211 Exudative age-related macular degeneration, right eye, with active choroidal neovascularization: Secondary | ICD-10-CM | POA: Diagnosis not present

## 2022-03-18 ENCOUNTER — Other Ambulatory Visit: Payer: PPO

## 2022-03-19 ENCOUNTER — Other Ambulatory Visit: Payer: Self-pay

## 2022-03-19 DIAGNOSIS — C61 Malignant neoplasm of prostate: Secondary | ICD-10-CM

## 2022-03-19 DIAGNOSIS — D696 Thrombocytopenia, unspecified: Secondary | ICD-10-CM

## 2022-03-20 ENCOUNTER — Inpatient Hospital Stay: Payer: PPO

## 2022-03-20 ENCOUNTER — Inpatient Hospital Stay: Payer: PPO | Admitting: Internal Medicine

## 2022-03-23 ENCOUNTER — Ambulatory Visit
Admission: RE | Admit: 2022-03-23 | Discharge: 2022-03-23 | Disposition: A | Discharge status: Home / Self Care | Payer: PPO | Source: Ambulatory Visit | Attending: Internal Medicine | Admitting: Internal Medicine

## 2022-03-23 DIAGNOSIS — D696 Thrombocytopenia, unspecified: Secondary | ICD-10-CM | POA: Insufficient documentation

## 2022-03-23 DIAGNOSIS — K7460 Unspecified cirrhosis of liver: Secondary | ICD-10-CM | POA: Insufficient documentation

## 2022-03-30 ENCOUNTER — Inpatient Hospital Stay: Payer: PPO | Attending: Internal Medicine

## 2022-03-30 ENCOUNTER — Encounter: Payer: Self-pay | Admitting: Internal Medicine

## 2022-03-30 ENCOUNTER — Inpatient Hospital Stay (HOSPITAL_BASED_OUTPATIENT_CLINIC_OR_DEPARTMENT_OTHER): Payer: PPO | Admitting: Internal Medicine

## 2022-03-30 VITALS — BP 143/61 | HR 55 | Temp 97.5°F | Ht 70.0 in | Wt 167.6 lb

## 2022-03-30 DIAGNOSIS — Z806 Family history of leukemia: Secondary | ICD-10-CM | POA: Insufficient documentation

## 2022-03-30 DIAGNOSIS — C61 Malignant neoplasm of prostate: Secondary | ICD-10-CM | POA: Diagnosis not present

## 2022-03-30 DIAGNOSIS — Z8042 Family history of malignant neoplasm of prostate: Secondary | ICD-10-CM | POA: Insufficient documentation

## 2022-03-30 DIAGNOSIS — M549 Dorsalgia, unspecified: Secondary | ICD-10-CM | POA: Insufficient documentation

## 2022-03-30 DIAGNOSIS — Z8052 Family history of malignant neoplasm of bladder: Secondary | ICD-10-CM | POA: Insufficient documentation

## 2022-03-30 DIAGNOSIS — Z87891 Personal history of nicotine dependence: Secondary | ICD-10-CM | POA: Diagnosis not present

## 2022-03-30 DIAGNOSIS — I4891 Unspecified atrial fibrillation: Secondary | ICD-10-CM | POA: Diagnosis not present

## 2022-03-30 DIAGNOSIS — D696 Thrombocytopenia, unspecified: Secondary | ICD-10-CM

## 2022-03-30 DIAGNOSIS — K7030 Alcoholic cirrhosis of liver without ascites: Secondary | ICD-10-CM | POA: Diagnosis not present

## 2022-03-30 DIAGNOSIS — M2550 Pain in unspecified joint: Secondary | ICD-10-CM | POA: Insufficient documentation

## 2022-03-30 DIAGNOSIS — D509 Iron deficiency anemia, unspecified: Secondary | ICD-10-CM | POA: Diagnosis not present

## 2022-03-30 DIAGNOSIS — I10 Essential (primary) hypertension: Secondary | ICD-10-CM | POA: Diagnosis not present

## 2022-03-30 DIAGNOSIS — K7460 Unspecified cirrhosis of liver: Secondary | ICD-10-CM | POA: Insufficient documentation

## 2022-03-30 DIAGNOSIS — Z8051 Family history of malignant neoplasm of kidney: Secondary | ICD-10-CM | POA: Insufficient documentation

## 2022-03-30 LAB — COMPREHENSIVE METABOLIC PANEL
ALT: 15 U/L (ref 0–44)
AST: 27 U/L (ref 15–41)
Albumin: 4 g/dL (ref 3.5–5.0)
Alkaline Phosphatase: 71 U/L (ref 38–126)
Anion gap: 6 (ref 5–15)
BUN: 9 mg/dL (ref 8–23)
CO2: 29 mmol/L (ref 22–32)
Calcium: 8.8 mg/dL — ABNORMAL LOW (ref 8.9–10.3)
Chloride: 95 mmol/L — ABNORMAL LOW (ref 98–111)
Creatinine, Ser: 0.75 mg/dL (ref 0.61–1.24)
GFR, Estimated: >60 mL/min (ref >60)
Glucose, Bld: 134 mg/dL — ABNORMAL HIGH (ref 70–99)
Potassium: 3.8 mmol/L (ref 3.5–5.1)
Sodium: 130 mmol/L — ABNORMAL LOW (ref 135–145)
Total Bilirubin: 1.2 mg/dL (ref 0.3–1.2)
Total Protein: 7.2 g/dL (ref 6.5–8.1)

## 2022-03-30 LAB — CBC WITH DIFFERENTIAL/PLATELET
Abs Immature Granulocytes: 0.01 10*3/uL (ref 0.00–0.07)
Basophils Absolute: 0 10*3/uL (ref 0.0–0.1)
Basophils Relative: 1 %
Eosinophils Absolute: 0.1 10*3/uL (ref 0.0–0.5)
Eosinophils Relative: 5 %
HCT: 36.9 % — ABNORMAL LOW (ref 39.0–52.0)
Hemoglobin: 12.4 g/dL — ABNORMAL LOW (ref 13.0–17.0)
Immature Granulocytes: 1 %
Lymphocytes Relative: 17 %
Lymphs Abs: 0.4 10*3/uL — ABNORMAL LOW (ref 0.7–4.0)
MCH: 27.9 pg (ref 26.0–34.0)
MCHC: 33.6 g/dL (ref 30.0–36.0)
MCV: 83.1 fL (ref 80.0–100.0)
Monocytes Absolute: 0.1 10*3/uL (ref 0.1–1.0)
Monocytes Relative: 6 %
Neutro Abs: 1.6 10*3/uL — ABNORMAL LOW (ref 1.7–7.7)
Neutrophils Relative %: 70 %
Platelets: 56 10*3/uL — ABNORMAL LOW (ref 150–400)
RBC: 4.44 MIL/uL (ref 4.22–5.81)
RDW: 13.5 % (ref 11.5–15.5)
WBC: 2.2 10*3/uL — ABNORMAL LOW (ref 4.0–10.5)
nRBC: 0 % (ref 0.0–0.2)

## 2022-03-30 LAB — FERRITIN: Ferritin: 58 ng/mL (ref 24–336)

## 2022-03-30 LAB — IRON AND TIBC
Iron: 78 ug/dL (ref 45–182)
Saturation Ratios: 20 % (ref 17.9–39.5)
TIBC: 398 ug/dL (ref 250–450)
UIBC: 320 ug/dL

## 2022-03-30 NOTE — Progress Notes (Signed)
[place] ?CONSULT NOTE ? ?Patient Care Team: ?Birdie Sons, MD as PCP - General (Family Medicine) ?Laneta Simmers as Physician Assistant (Urology) ?Isaias Cowman, MD as Consulting Physician (Cardiology) ?Ree Edman, MD (Dermatology) ?Pa, [place] ?Cammie Sickle, MD as Consulting Physician (Hematology and Oncology) ? ?CHIEF COMPLAINTS/PURPOSE OF CONSULTATION: Thrombocytopenia ? ? ?HEMATOLOGY HISTORY ? 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 ?# Prostate cancer [s/p Prostatectomy 5790]; elevation PSA-Lupron [Sharon McGovern] ? 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 Vicente Males ?  ?Prostate cancer ([place]) (Resolved)  ?06/20/2015 Initial Diagnosis  ? Prostate cancer ([place]) ?  ?Prostate cancer ([place])  ?09/12/2020 Initial Diagnosis  ? Prostate cancer ([place]) ?  ? ? ? ?HISTORY OF PRESENTING ILLNESS: Accompanied by his wife.  Walks with a cane. ?Ian Sanders 81 y.o.  male pleasant patient with history of alcohol use /cirrhosis /A. Fib-he is here for follow-up/review results of the ultrasound labs. ? ?Patient denies any blood in stools or black-colored stools.  Denies any nausea vomiting.  Denies any swelling in the legs. ? ?No falls.  Continues to have joint pains back pain. ?  ?Review of Systems  ?Constitutional:  Positive for malaise/fatigue. Negative for chills, diaphoresis, fever and weight loss.  ?HENT:  Negative for nosebleeds and sore throat.   ?Eyes:  Negative for double vision.  ?Respiratory:  Negative for cough, hemoptysis, sputum production, shortness of breath and wheezing.   ?Cardiovascular:  Negative for chest pain, palpitations, orthopnea and leg swelling.   ?Gastrointestinal:  Negative for abdominal pain, blood in stool, constipation, diarrhea, heartburn, melena, nausea and vomiting.  ?Genitourinary:  Negative for dysuria, frequency and urgency.  ?Musculoskeletal:  Positive for back pain and joint pain.  ?Skin: Negative.  Negative for itching and rash.  ?Neurological:  Negative for dizziness, tingling, focal weakness, weakness and headaches.  ?Endo/Heme/Allergies:  Does not bruise/bleed easily.  ?Psychiatric/Behavioral:  Negative for depression. The patient is not nervous/anxious and does not have insomnia.   ? ? ?MEDICAL HISTORY:  ?Past Medical History:  ?Diagnosis Date  ? Atrial fibrillation ([place])   ? Basal cell carcinoma   ? Benign paroxysmal positional vertigo   ? Cancer [facility])   ? History of chicken pox   ? History of measles   ? History of mumps   ? Hypertension   ? Mononeuritis of unspecified site   ? Other seborrheic keratosis   ? Stroke [facility]) 06/2020  ? Transient ischemic attack (TIA), and cerebral infarction without residual deficits(V12.54)   ? Traumatic subdural hematoma without loss of consciousness ([place]) 06/25/2020  ? Unspecified hypothyroidism   ? ? ?SURGICAL HISTORY: ?Past Surgical History:  ?Procedure Laterality Date  ? Carotid Doppler Ultrasound  04/02/2010  ? Small amount Calcified plaque bilaterally, no significant stenosis.  ? CATARACT EXTRACTION W/PHACO Right 04/15/2021  ? Procedure: CATARACT EXTRACTION PHACO AND INTRAOCULAR LENS PLACEMENT ([place]) RIGHT;  Surgeon: Birder Robson, MD;  Location: [place];  Service: Ophthalmology;  Laterality: Right;  7.98 ?0:47.2  ? CATARACT EXTRACTION W/PHACO Left 05/20/2021  ? Procedure: CATARACT EXTRACTION PHACO AND INTRAOCULAR LENS PLACEMENT ([facility]) LEFT 3.37 00:32.0;  Surgeon: Birder Robson, MD;  Location: [place];  Service: Ophthalmology;  Laterality: Left;  Requests arrival 10A or after  ? COLONOSCOPY  05/11/2014  ?  Tubular Adenoma. Dr. Allen Norris  ? COLONOSCOPY WITH PROPOFOL N/A 05/23/2018  ?  Procedure: COLONOSCOPY WITH PROPOFOL;  Surgeon: [name], MD;  Location[facility] END[facility]COPY;  Service: Gastroenterology;  Laterality: N/A;  ? DOPPLER ECHOCARDIOGRAPHY  04/02/2010  ? Mild left atrial dilation. Normal right atrium. No valvular disease. No thrombus. Normal LV function. EF>55%  ? ESOPHAGOGASTRODUODENOSCOPY (EGD) WITH PROPOFOL N/A 08/16/2019  ? Procedure: ESOPHAGOGASTRODUODENOSCOPY (EGD) WITH PROPOFOL;  Surgeon: [name], MD;  Location: [facility] ENDOSCOPY;  Service: Gastroenterology;  Laterality: N/A;  ? FINGER AMPUTATION    ? HERNIA REPAIR    ? MRI Brain with and without contrast  03/20/2010  ? Suggestive of basal ganglia lacunar infarct  ? PROSTATECTOMY  1995  ? Abdominal  ? SKIN SURGERY    ? multiple  ? Thumb surgery    ? ? ?SOCIAL HISTORY: ?Social History  ? ?Socioeconomic History  ? Marital status: Married  ?  Spouse name: Not on file  ? Number of children: 2  ? Years of education: Not on file  ? Highest education level: Associate degree: occupational, Hotel manager, or vocational program  ?Occupational History  ? Occupation: Retired  ?Tobacco Use  ? Smoking status: Former  ?  Packs/day: 0.00  ?  Years: 12.00  ?  Pack years: 0.00  ?  Types: Cigarettes  ? Smokeless tobacco: Current  ?  Types: Chew  ? Tobacco comments:  ?  quit over 45 years ago  ?Vaping Use  ? Vaping Use: Never used  ?Substance and Sexual Activity  ? Alcohol use: Not Currently  ?  Alcohol/week: 5.0 standard drinks  ?  Types: 5 Cans of beer per week  ?  Comment: quit drinking summer 2021  ? Drug use: No  ? Sexual activity: Not on file  ?Other Topics Concern  ? Not on file  ?Social History Narrative  ? 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 ?Social Determinants of Health  ? ?Financial Resource Strain: Low Risk   ? Difficulty of Paying Living Expenses: Not hard at all  ?Food Insecurity: No Food Insecurity  ? Worried About Charity fundraiser in the Last Year: Never true   ? Ran Out of Food in the Last Year: Never true  ?Transportation Needs: No Transportation Needs  ? Lack of Transportation (Medical): No  ? Lack of Transportation (Non-Medical): No  ?Physical Activity: Inactive  ? Days of Exercise per Week: 0 days  ? Minutes of Exercise per Session: 0 min  ?Stress: No Stress Concern Present  ? Feeling of Stress : Not at all  ?Social Connections: Moderately Isolated  ? Frequency of Communication with Friends and Family: Three times a week  ? Frequency of Social Gatherings with Friends and Family: Once a week  ? Attends Religious Services: Never  ? Active Member of Clubs or Organizations: No  ? Attends Archivist Meetings: Never  ? Marital Status: Married  ?Intimate Partner Violence: Not At Risk  ? Fear of Current or Ex-Partner: No  ? Emotionally Abused: No  ? Physically Abused: No  ? Sexually Abused: No  ? ? ?FAMILY HISTORY: ?Family History  ?Problem Relation Age of Onset  ? Heart disease Mother 48  ?     CABG  ? Hypertension Mother   ? Heart attack Mother   ? Hypertension Brother   ? Heart disease Sister   ?     stents  ? Prostate cancer  Brother   ? Kidney cancer Brother   ? Skin cancer Brother   ? Hypertension Brother   ? Bladder Cancer Sister   ? Leukemia Father   ? Stroke Father   ?     multiple  ? Cerebral aneurysm Sister   ? Kidney cancer Brother   ? ? ?ALLERGIES:  is allergic to lisinopril, myrbetriq [mirabegron], other, triamterene, amlodipine, atorvastatin, dabigatran etexilate mesylate, and dabigatran etexilate mesylate. ? ?MEDICATIONS:  ?Current Outpatient Medications  ?Medication Sig Dispense Refill  ? calcium-vitamin D (OSCAL) 250-125 MG-UNIT per tablet Take 1 tablet by mouth 2 (two) times daily.     ? chlorthalidone (HYGROTON) 25 MG tablet Take 1 tablet (25 mg total) by mouth daily. 90 tablet 3  ? Cyanocobalamin (VITAMIN B-12 PO) Take 1 tablet by mouth daily.    ? cyclobenzaprine (FLEXERIL) 10 MG tablet Take 0.5 tablets (5 mg total) by mouth 2 (two) times daily  as needed. 30 tablet 2  ? Ferrous Sulfate 134 MG TABS Take 1 tablet by mouth daily.    ? flecainide (TAMBOCOR) 50 MG tablet Take 1 tablet (50 mg total) by mouth 2 (two) times daily. 180 tablet 3  ? gabapentin (NEURO

## 2022-03-30 NOTE — Assessment & Plan Note (Addendum)
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  November 2021 STABLE; bone marrow- NEG/reactive.  Continue surveillance. ? 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 iron every other day.   ? ?#A. fib on on aspirin; Xarelto discontinued [recent fall intracranial bleed]- STABLE.  ? ?#Cirrhosis child Pugh a [CT scan April 2022-NEG- no evidence of any decompensation; discussed re: [place] screening; ultrasound in 6 months; MAY 2023-liver morphology suggestive of cirrhosis.  No focal lesions noted.  aFP pending today.  ? ?# Prostate cancer/biochemical recurrence- PSA [Proastatectomy 1995;PSA- 9 started on Lupron] ; Shannon McGowan].  2021 PSA less than 0.1. STABLE. ? ?DISPOSITION: ?# follow up in 6 months- MD; labs-cbc/cmp;AFP; iron studies/ferritin;US RUQ prior - Dr.B ? ?Cc: Dr.Fisher-  ?

## 2022-03-30 NOTE — Progress Notes (Signed)
Itchy rash right arm, starting after he had an eye injection. ? ?U/S results. ? ? ?

## 2022-03-31 LAB — AFP TUMOR MARKER: AFP, Serum, Tumor Marker: 2.3 ng/mL (ref 0.0–6.4)

## 2022-04-17 ENCOUNTER — Telehealth: Payer: Self-pay | Admitting: Family Medicine

## 2022-04-17 DIAGNOSIS — L309 Dermatitis, unspecified: Secondary | ICD-10-CM | POA: Diagnosis not present

## 2022-04-17 NOTE — Telephone Encounter (Signed)
NP Marcie Bal  informing provider pt has a flat itchy rash on both arms x1w  Marcie Bal advised pt to apply hydrocodone cream and to take Claritin, pt will schedule appt w/ dermatologist if rash does not improve

## 2022-04-24 DIAGNOSIS — H353211 Exudative age-related macular degeneration, right eye, with active choroidal neovascularization: Secondary | ICD-10-CM | POA: Diagnosis not present

## 2022-05-04 DIAGNOSIS — L309 Dermatitis, unspecified: Secondary | ICD-10-CM | POA: Diagnosis not present

## 2022-05-08 ENCOUNTER — Other Ambulatory Visit: Payer: Self-pay | Admitting: Family Medicine

## 2022-05-08 DIAGNOSIS — G629 Polyneuropathy, unspecified: Secondary | ICD-10-CM

## 2022-05-12 DIAGNOSIS — H3589 Other specified retinal disorders: Secondary | ICD-10-CM | POA: Diagnosis not present

## 2022-05-12 DIAGNOSIS — H353211 Exudative age-related macular degeneration, right eye, with active choroidal neovascularization: Secondary | ICD-10-CM | POA: Diagnosis not present

## 2022-05-30 ENCOUNTER — Other Ambulatory Visit: Payer: Self-pay | Admitting: Family Medicine

## 2022-06-04 ENCOUNTER — Encounter: Payer: Self-pay | Admitting: Gastroenterology

## 2022-06-04 ENCOUNTER — Other Ambulatory Visit: Payer: Self-pay

## 2022-06-04 ENCOUNTER — Ambulatory Visit: Payer: PPO | Admitting: Gastroenterology

## 2022-06-04 VITALS — BP 174/72 | HR 86 | Temp 97.8°F | Wt 167.8 lb

## 2022-06-04 DIAGNOSIS — Z23 Encounter for immunization: Secondary | ICD-10-CM

## 2022-06-04 DIAGNOSIS — R0683 Snoring: Secondary | ICD-10-CM | POA: Insufficient documentation

## 2022-06-04 DIAGNOSIS — K7460 Unspecified cirrhosis of liver: Secondary | ICD-10-CM | POA: Diagnosis not present

## 2022-06-04 NOTE — Progress Notes (Signed)
Patient was offered to finish his Hepatitis A vaccines and he declined. I also told him that Dr. Vicente Males wanted him to have blood work done and he also declined. The only thing that he agreed on was to have his ultrasound done that has been scheduled and follow up in 6 months.

## 2022-06-04 NOTE — Progress Notes (Signed)
[name] MD, MRCP(U.K) [address]  [place]  [place], [place] [address]  Main: [phone]  Fax: [phone]   Primary Care Physician: Birdie Sons, MD  Primary Gastroenterologist:  Dr. [name]   Chief Complaint  Patient presents with   cirrhosis of the liver    HPI: Ian Sanders is a 81 y.o. 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   He has a history of prostate cancer.  He has served in [employer] and spent time at [place].  He has had a tattoo during his service.  He has also had a blood transfusion in 1995.  He absolutely denied any illegal drug use either intravenous or snorting of cocaine.  Iron deficiency anemia secondary to possible hematuria   07/16/2019: EGD: no esophageal varices.   07/13/2019: Not immune to hepatitis A and B and need vaccination.  Low level of iron.  Negative hepatitis B and C.  ANA positive.  Negative smooth muscle antibody.  Ferritin of 25 and iron of 60 .  Celiac serology negative.Negative AMA and smooth muscle antibody.     09/16/2020: Hb 11.3, MCV 79, platelet count 47. Bone marrows shows no pathological abnormality as etiology for low platelet count.    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  Gastric underdistention cannot exclude gastric wall thickening gastritis.  Small volume pelvic fluid.  Left-sided gynecomastia.  Prostatectomy without metastatic disease.  Bilateral femoral head avascular necrosis.   Interval history 12/04/2021-06/04/2022  03/23/2022: RUQ USG: no focal lesion  03/30/2022: AFP, normal. Iron studies normal. Hb 12.4 , Cr 0.75    11/21/2021: Albumin 4.7 total bilirubin 1.1, creatinine 0.88, hemoglobin 12.3 g platelet count 50 iron studies were normal in November 2022.  He is doing well does not drink any alcohol no NSAID use taking his Xifaxan every day take his  lactulose.  Having some difficulty sleeping at night.  No issues with memory presently.  He says he has taken his hepatitis a and B vaccine with a record showing he is taken 1 out of 3 doses low salt in his diet no other complaints  Current Outpatient Medications  Medication Sig Dispense Refill   atenolol-chlorthalidone (TENORETIC) 50-25 MG tablet Take 1 tablet by mouth daily.     calcium-vitamin D (OSCAL) 250-125 MG-UNIT per tablet Take 1 tablet by mouth 2 (two) times daily.      cyanocobalamin 1000 MCG tablet Take 1 tablet by mouth daily.     cyclobenzaprine (FLEXERIL) 10 MG tablet Take 1 tablet by mouth daily.     digoxin (LANOXIN) 0.125 MG tablet Take 1 tablet by mouth daily.     diltiazem (CARDIZEM) 30 MG tablet Take 1 tablet by mouth daily.     FA-B6-B12-D-Omega 3-Phytoster (ANIMI-3) 1 MG CAPS Take 1 capsule by mouth 1 day or 1 dose.     Ferrous Sulfate 134 MG TABS Take 1 tablet by mouth daily.     flecainide (TAMBOCOR) 50 MG tablet Take 1 tablet (50 mg total) by mouth 2 (two) times daily. 180 tablet 3   gabapentin (NEURONTIN) 100 MG capsule Take 1 capsule by mouth daily.     lactulose (CHRONULAC) 10 GM/15ML solution Take 30 mLs (20 g total) by mouth 3 (three) times daily. 2700 mL 11   Leuprolide Acetate, 6 Month, (LUPRON) 45 MG injection Inject  45 mg into the muscle every 6 (six) months.     lisinopril (ZESTRIL) 5 MG tablet Take 1 tablet by mouth daily.     losartan (COZAAR) 25 MG tablet Take 1 tablet by mouth daily.     meclizine (ANTIVERT) 25 MG tablet Take 1 tablet by mouth daily.     Multiple Vitamins-Minerals (PRESERVISION AREDS 2) CHEW Chew 1 mg by mouth daily.     omeprazole (PRILOSEC) 20 MG capsule Take 1 capsule by mouth daily.     polyvinyl alcohol (LIQUIFILM TEARS) 1.4 % ophthalmic solution 1 drop as needed.     Potassium 99 MG TABS Take 1 tablet by mouth daily.     rifaximin (XIFAXAN) 550 MG TABS tablet Take 1 tablet (550 mg total) by mouth 2 (two) times daily. 60 tablet 11    rivaroxaban (XARELTO) 20 MG TABS tablet Take 1 tablet by mouth daily.     traMADol (ULTRAM) 50 MG tablet Take 1 tablet by mouth every 6 (six) hours as needed.     Vibegron (GEMTESA) 75 MG TABS Take 75 mg by mouth daily. 90 tablet 3   Vitamin A 2400 MCG (8000 UT) CAPS Take by mouth.     zolpidem (AMBIEN) 10 MG tablet TAKE ONE-HALF TO ONE TABLET AT BEDTIME. 30 tablet 5   No current facility-administered medications for this visit.    Allergies as of 06/04/2022 - Review Complete 03/30/2022  Allergen Reaction Noted   Lisinopril Swelling 02/12/2015   Myrbetriq [mirabegron] Swelling 12/26/2015   Other Other (See Comments) 08/20/2020   Triamterene  08/27/2004   Amlodipine Other (See Comments) 06/11/2020   Atorvastatin Other (See Comments) 06/19/2015   Dabigatran etexilate mesylate Other (See Comments) 12/14/2011   Dabigatran etexilate mesylate Other (See Comments) 12/14/2011    ROS:  General: Negative for anorexia, weight loss, fever, chills, fatigue, weakness. ENT: Negative for hoarseness, difficulty swallowing , nasal congestion. CV: Negative for chest pain, angina, palpitations, dyspnea on exertion, peripheral edema.  Respiratory: Negative for dyspnea at rest, dyspnea on exertion, cough, sputum, wheezing.  GI: See history of present illness. GU:  Negative for dysuria, hematuria, urinary incontinence, urinary frequency, nocturnal urination.  Endo: Negative for unusual weight change.    Physical Examination:   There were no vitals taken for this visit.  General: Well-nourished, well-developed in no acute distress.  Eyes: No icterus. Conjunctivae pink. Neuro: Alert and oriented x 3.  Grossly intact. Skin: Warm and dry, no jaundice.   Psych: Alert and cooperative, normal mood and affect.   Imaging Studies: No results found.  Assessment and Plan:   Ian Sanders is a 81 y.o. 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  Compensated.   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  Right upper quadrant ultrasound to screen for [facility] in 09/2022 2.  EGD to screen for esophageal varices discussed but he is not keen on it at this point of time 4.  Check INR to calculate meld score 5.  Continue lactulose titrated to 2 soft bowel movements per day.  Continue Xifaxan 550 mg twice daily 6.  No NSAIDs 7. Needs Hep A/ B vaccine today he only had 1 out of 3 doses we will complete the course. Shingles vaccine with his PCP.     Dr [name]  MD,MRCP [facility]) Follow up in 27-month

## 2022-06-05 ENCOUNTER — Other Ambulatory Visit: Payer: Self-pay | Admitting: Family Medicine

## 2022-06-05 NOTE — Telephone Encounter (Signed)
Requested medication (s) are due for refill today: yes  Requested medication (s) are on the active medication list: yes  Last refill:  12/15/21  Future visit scheduled: no  Notes to clinic:  med not delegated to NT to RF   Requested Prescriptions  Pending Prescriptions Disp Refills   zolpidem (AMBIEN) 10 MG tablet [Pharmacy Med Name: ZOLPIDEM TARTRATE 10 MG TABLET] 30 tablet 0    Sig: TAKE ONE-HALF TO ONE TABLET AT BEDTIME.     Not Delegated - Psychiatry:  Anxiolytics/Hypnotics Failed - 06/05/2022  2:24 PM      Failed - This refill cannot be delegated      Failed - Urine Drug Screen completed in last 360 days      Passed - Valid encounter within last 6 months    Recent Outpatient Visits           6 months ago Primary hypertension   [facility] Birdie Sons, MD   10 months ago Pancytopenia [facility])   [facility] Birdie Sons, MD   1 year ago [place], Donald E, MD   1 year ago Annual physical exam   [facility] Birdie Sons, MD   1 year ago Traumatic subdural hematoma without loss of consciousness, initial encounter [facility])   [facility] Birdie Sons, MD

## 2022-06-09 DIAGNOSIS — F1010 Alcohol abuse, uncomplicated: Secondary | ICD-10-CM | POA: Diagnosis not present

## 2022-06-09 DIAGNOSIS — I10 Essential (primary) hypertension: Secondary | ICD-10-CM | POA: Diagnosis not present

## 2022-06-09 DIAGNOSIS — R002 Palpitations: Secondary | ICD-10-CM | POA: Diagnosis not present

## 2022-06-09 DIAGNOSIS — K7030 Alcoholic cirrhosis of liver without ascites: Secondary | ICD-10-CM | POA: Diagnosis not present

## 2022-06-09 DIAGNOSIS — R001 Bradycardia, unspecified: Secondary | ICD-10-CM | POA: Diagnosis not present

## 2022-06-09 DIAGNOSIS — Z8673 Personal history of transient ischemic attack (TIA), and cerebral infarction without residual deficits: Secondary | ICD-10-CM | POA: Diagnosis not present

## 2022-06-09 DIAGNOSIS — D696 Thrombocytopenia, unspecified: Secondary | ICD-10-CM | POA: Diagnosis not present

## 2022-06-09 DIAGNOSIS — E781 Pure hyperglyceridemia: Secondary | ICD-10-CM | POA: Diagnosis not present

## 2022-06-11 ENCOUNTER — Other Ambulatory Visit: Payer: Self-pay | Admitting: Family Medicine

## 2022-06-11 DIAGNOSIS — G8929 Other chronic pain: Secondary | ICD-10-CM

## 2022-06-11 DIAGNOSIS — I729 Aneurysm of unspecified site: Secondary | ICD-10-CM | POA: Diagnosis not present

## 2022-06-11 DIAGNOSIS — E569 Vitamin deficiency, unspecified: Secondary | ICD-10-CM | POA: Diagnosis not present

## 2022-06-11 DIAGNOSIS — G250 Essential tremor: Secondary | ICD-10-CM | POA: Diagnosis not present

## 2022-06-11 DIAGNOSIS — G20 Parkinson's disease: Secondary | ICD-10-CM | POA: Diagnosis not present

## 2022-06-11 NOTE — Telephone Encounter (Signed)
Requested medication (s) are due for refill today: yes  Requested medication (s) are on the active medication list: yes  Last refill:  05/08/22  Future visit scheduled: no  Notes to clinic:  historical med. Please advise for refill.      Requested Prescriptions  Pending Prescriptions Disp Refills   gabapentin (NEURONTIN) 100 MG capsule [Pharmacy Med Name: GABAPENTIN 100 MG CAPSULE] 90 capsule 0    Sig: Take 1 capsule (100 mg total) by mouth 3 (three) times daily.     Neurology: Anticonvulsants - gabapentin Passed - 06/11/2022 11:48 AM      Passed - Cr in normal range and within 360 days    Creatinine  Date Value Ref Range Status  08/18/2012 1.36 (H) 0.60 - 1.30 mg/dL Final   Creatinine, Ser  Date Value Ref Range Status  03/30/2022 0.75 0.61 - 1.24 mg/dL Final   Creatinine, POC  Date Value Ref Range Status  04/27/2017 n/a mg/dL Final         Passed - Completed PHQ-2 or PHQ-9 in the last 360 days      Passed - Valid encounter within last 12 months    Recent Outpatient Visits           6 months ago Primary hypertension   [facility] Birdie Sons, MD   10 months ago Pancytopenia [facility])   [facility] Birdie Sons, MD   1 year ago [place], Donald E, MD   1 year ago Annual physical exam   [facility] Birdie Sons, MD   1 year ago Traumatic subdural hematoma without loss of consciousness, initial encounter [facility])   [facility] Birdie Sons, MD

## 2022-06-12 DIAGNOSIS — H353211 Exudative age-related macular degeneration, right eye, with active choroidal neovascularization: Secondary | ICD-10-CM | POA: Diagnosis not present

## 2022-06-12 NOTE — Telephone Encounter (Signed)
Refill sent.

## 2022-06-17 ENCOUNTER — Other Ambulatory Visit: Payer: Self-pay | Admitting: Student

## 2022-06-17 DIAGNOSIS — I729 Aneurysm of unspecified site: Secondary | ICD-10-CM

## 2022-06-23 DIAGNOSIS — H903 Sensorineural hearing loss, bilateral: Secondary | ICD-10-CM | POA: Diagnosis not present

## 2022-06-26 ENCOUNTER — Ambulatory Visit: Payer: PPO | Admitting: Physician Assistant

## 2022-06-29 ENCOUNTER — Ambulatory Visit (INDEPENDENT_AMBULATORY_CARE_PROVIDER_SITE_OTHER): Payer: PPO | Admitting: Physician Assistant

## 2022-06-29 DIAGNOSIS — C61 Malignant neoplasm of prostate: Secondary | ICD-10-CM

## 2022-06-29 MED ORDER — LEUPROLIDE ACETATE (6 MONTH) 45 MG ~~LOC~~ KIT
45.0000 mg | PACK | Freq: Once | SUBCUTANEOUS | Status: AC
  Administered 2022-06-29: 45 mg via SUBCUTANEOUS

## 2022-06-29 NOTE — Progress Notes (Signed)
Eligard SubQ Injection    Due to Prostate Cancer patient is present today for a Eligard Injection.   Medication: Eligard 6 month Dose: 45 mg  Location: left buttock Lot: [id number] Exp:09/2023   Patient tolerated well, no complications were noted   Performed by: Gaspar Cola CMA   Per Dr. Bernardo Heater patient is to continue therapy for 6 months . Patient's next follow up was scheduled for 12/31/2022 This appointment was scheduled using wheel and given to patient today along with reminder continue on Vitamin D 800-1000iu and Calcium 1000-'1200mg'$  daily while on Androgen Deprivation Therapy.  PA approval dates: No PA required

## 2022-06-30 LAB — PSA: Prostate Specific Ag, Serum: <0.1 ng/mL (ref 0.0–4.0)

## 2022-07-06 DIAGNOSIS — I480 Paroxysmal atrial fibrillation: Secondary | ICD-10-CM | POA: Diagnosis not present

## 2022-07-06 DIAGNOSIS — I10 Essential (primary) hypertension: Secondary | ICD-10-CM | POA: Diagnosis not present

## 2022-07-06 DIAGNOSIS — Z9181 History of falling: Secondary | ICD-10-CM | POA: Diagnosis not present

## 2022-07-06 DIAGNOSIS — R251 Tremor, unspecified: Secondary | ICD-10-CM | POA: Diagnosis not present

## 2022-07-06 DIAGNOSIS — Z6823 Body mass index (BMI) 23.0-23.9, adult: Secondary | ICD-10-CM | POA: Diagnosis not present

## 2022-07-06 DIAGNOSIS — D692 Other nonthrombocytopenic purpura: Secondary | ICD-10-CM | POA: Diagnosis not present

## 2022-07-06 DIAGNOSIS — E119 Type 2 diabetes mellitus without complications: Secondary | ICD-10-CM | POA: Diagnosis not present

## 2022-07-06 DIAGNOSIS — R002 Palpitations: Secondary | ICD-10-CM | POA: Diagnosis not present

## 2022-07-07 ENCOUNTER — Other Ambulatory Visit: Payer: Self-pay | Admitting: Family Medicine

## 2022-07-07 DIAGNOSIS — R002 Palpitations: Secondary | ICD-10-CM | POA: Diagnosis not present

## 2022-07-07 DIAGNOSIS — F1010 Alcohol abuse, uncomplicated: Secondary | ICD-10-CM | POA: Diagnosis not present

## 2022-07-07 DIAGNOSIS — I10 Essential (primary) hypertension: Secondary | ICD-10-CM | POA: Diagnosis not present

## 2022-07-07 DIAGNOSIS — I700 Atherosclerosis of aorta: Secondary | ICD-10-CM | POA: Diagnosis not present

## 2022-07-07 DIAGNOSIS — I480 Paroxysmal atrial fibrillation: Secondary | ICD-10-CM | POA: Diagnosis not present

## 2022-07-07 DIAGNOSIS — R001 Bradycardia, unspecified: Secondary | ICD-10-CM | POA: Diagnosis not present

## 2022-07-07 DIAGNOSIS — E781 Pure hyperglyceridemia: Secondary | ICD-10-CM | POA: Diagnosis not present

## 2022-07-07 NOTE — Telephone Encounter (Signed)
Requested medication (s) are due for refill today - yes  Requested medication (s) are on the active medication list -yes  Future visit scheduled -no  Last refill: 06/08/22 #30  Notes to clinic: non delegated Rx  Requested Prescriptions  Pending Prescriptions Disp Refills   zolpidem (AMBIEN) 10 MG tablet [Pharmacy Med Name: ZOLPIDEM TARTRATE 10 MG TABLET] 30 tablet 0    Sig: TAKE ONE-HALF TO ONE TABLET AT BEDTIME.     Not Delegated - Psychiatry:  Anxiolytics/Hypnotics Failed - 07/07/2022 11:47 AM      Failed - This refill cannot be delegated      Failed - Urine Drug Screen completed in last 360 days      Passed - Valid encounter within last 6 months    Recent Outpatient Visits           7 months ago Primary hypertension   [facility] Birdie Sons, MD   11 months ago Pancytopenia [facility])   [facility] Birdie Sons, MD   1 year ago [place], Donald E, MD   1 year ago Annual physical exam   [facility] Birdie Sons, MD   1 year ago Traumatic subdural hematoma without loss of consciousness, initial encounter [facility])   [facility] Birdie Sons, MD       Future Appointments             In 5 months T[place], MD [place]               Requested Prescriptions  Pending Prescriptions Disp Refills   zolpidem (AMBIEN) 10 MG tablet [Pharmacy Med Name: ZOLPIDEM TARTRATE 10 MG TABLET] 30 tablet 0    Sig: TAKE ONE-HALF TO ONE TABLET AT BEDTIME.     Not Delegated - Psychiatry:  Anxiolytics/Hypnotics Failed - 07/07/2022 11:47 AM      Failed - This refill cannot be delegated      Failed - Urine Drug Screen completed in last 360 days      Passed - Valid encounter within last 6 months    Recent Outpatient Visits           7 months ago Primary hypertension   [facility] Birdie Sons, MD   11 months ago Pancytopenia  [facility])   [facility] Birdie Sons, MD   1 year ago [place], Donald E, MD   1 year ago Annual physical exam   [facility] Birdie Sons, MD   1 year ago Traumatic subdural hematoma without loss of consciousness, initial encounter [facility])   [facility] Birdie Sons, MD       Future Appointments             In 5 months Stoioff, Ronda Fairly, MD [place]              bb

## 2022-07-09 ENCOUNTER — Ambulatory Visit
Admission: RE | Admit: 2022-07-09 | Discharge: 2022-07-09 | Disposition: A | Discharge status: Home / Self Care | Payer: PPO | Source: Ambulatory Visit | Attending: Student | Admitting: Student

## 2022-07-09 DIAGNOSIS — I6502 Occlusion and stenosis of left vertebral artery: Secondary | ICD-10-CM | POA: Diagnosis not present

## 2022-07-09 DIAGNOSIS — Q283 Other malformations of cerebral vessels: Secondary | ICD-10-CM | POA: Diagnosis not present

## 2022-07-09 DIAGNOSIS — I729 Aneurysm of unspecified site: Secondary | ICD-10-CM

## 2022-07-24 ENCOUNTER — Other Ambulatory Visit: Payer: Self-pay | Admitting: Physician Assistant

## 2022-07-24 DIAGNOSIS — G8929 Other chronic pain: Secondary | ICD-10-CM

## 2022-07-27 NOTE — Telephone Encounter (Signed)
Requested Prescriptions  Pending Prescriptions Disp Refills  . gabapentin (NEURONTIN) 100 MG capsule [Pharmacy Med Name: GABAPENTIN 100 MG CAPSULE] 90 capsule 0    Sig: Take 1 capsule (100 mg total) by mouth 3 (three) times daily.     Neurology: Anticonvulsants - gabapentin Passed - 07/24/2022 10:03 AM      Passed - Cr in normal range and within 360 days    Creatinine  Date Value Ref Range Status  08/18/2012 1.36 (H) 0.60 - 1.30 mg/dL Final   Creatinine, Ser  Date Value Ref Range Status  03/30/2022 0.75 0.61 - 1.24 mg/dL Final   Creatinine, POC  Date Value Ref Range Status  04/27/2017 n/a mg/dL Final         Passed - Completed PHQ-2 or PHQ-9 in the last 360 days      Passed - Valid encounter within last 12 months    Recent Outpatient Visits          8 months ago Primary hypertension   [facility] Birdie Sons, MD   11 months ago Pancytopenia [facility])   [facility] Birdie Sons, MD   1 year ago Prediabetes   [facility] Birdie Sons, MD   1 year ago Annual physical exam   [facility] Birdie Sons, MD   2 years ago Traumatic subdural hematoma without loss of consciousness, initial encounter [facility])   [facility] Birdie Sons, MD      Future Appointments            In 5 months [place], MD [place]

## 2022-08-05 DIAGNOSIS — I720 Aneurysm of carotid artery: Secondary | ICD-10-CM | POA: Diagnosis not present

## 2022-08-05 DIAGNOSIS — I6502 Occlusion and stenosis of left vertebral artery: Secondary | ICD-10-CM | POA: Diagnosis not present

## 2022-08-06 ENCOUNTER — Other Ambulatory Visit: Payer: Self-pay | Admitting: Family Medicine

## 2022-08-12 DIAGNOSIS — G20 Parkinson's disease: Secondary | ICD-10-CM | POA: Diagnosis not present

## 2022-08-12 DIAGNOSIS — Z8679 Personal history of other diseases of the circulatory system: Secondary | ICD-10-CM | POA: Diagnosis not present

## 2022-08-12 DIAGNOSIS — R42 Dizziness and giddiness: Secondary | ICD-10-CM | POA: Diagnosis not present

## 2022-08-12 DIAGNOSIS — I729 Aneurysm of unspecified site: Secondary | ICD-10-CM | POA: Diagnosis not present

## 2022-08-12 DIAGNOSIS — Z79899 Other long term (current) drug therapy: Secondary | ICD-10-CM | POA: Diagnosis not present

## 2022-08-12 DIAGNOSIS — R251 Tremor, unspecified: Secondary | ICD-10-CM | POA: Diagnosis not present

## 2022-08-14 DIAGNOSIS — H353211 Exudative age-related macular degeneration, right eye, with active choroidal neovascularization: Secondary | ICD-10-CM | POA: Diagnosis not present

## 2022-08-26 ENCOUNTER — Other Ambulatory Visit: Payer: Self-pay | Admitting: Family Medicine

## 2022-08-26 DIAGNOSIS — M545 Other chronic pain: Secondary | ICD-10-CM

## 2022-08-28 DIAGNOSIS — H903 Sensorineural hearing loss, bilateral: Secondary | ICD-10-CM | POA: Diagnosis not present

## 2022-09-30 ENCOUNTER — Other Ambulatory Visit: Payer: Self-pay | Admitting: Family Medicine

## 2022-09-30 ENCOUNTER — Ambulatory Visit
Admission: RE | Admit: 2022-09-30 | Discharge: 2022-09-30 | Disposition: A | Discharge status: Home / Self Care | Payer: PPO | Source: Ambulatory Visit | Attending: Internal Medicine | Admitting: Internal Medicine

## 2022-09-30 DIAGNOSIS — K7030 Alcoholic cirrhosis of liver without ascites: Secondary | ICD-10-CM | POA: Diagnosis not present

## 2022-09-30 DIAGNOSIS — K8020 Calculus of gallbladder without cholecystitis without obstruction: Secondary | ICD-10-CM | POA: Diagnosis not present

## 2022-09-30 DIAGNOSIS — K824 Cholesterolosis of gallbladder: Secondary | ICD-10-CM | POA: Diagnosis not present

## 2022-09-30 DIAGNOSIS — K7460 Unspecified cirrhosis of liver: Secondary | ICD-10-CM | POA: Diagnosis not present

## 2022-09-30 NOTE — Telephone Encounter (Signed)
Requested medication (s) are due for refill today: yes  Requested medication (s) are on the active medication list: yes  Last refill:  08/06/22  Future visit scheduled: no  Notes to clinic:  Unable to refill per protocol, cannot delegate.      Requested Prescriptions  Pending Prescriptions Disp Refills   zolpidem (AMBIEN) 10 MG tablet [Pharmacy Med Name: ZOLPIDEM TARTRATE 10 MG TABLET] 30 tablet 0    Sig: TAKE ONE-HALF TO ONE TABLET AT BEDTIME.     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

## 2022-09-30 NOTE — Telephone Encounter (Signed)
Pt needs apt. Left VM to call back to schedule.

## 2022-10-01 ENCOUNTER — Ambulatory Visit (INDEPENDENT_AMBULATORY_CARE_PROVIDER_SITE_OTHER): Payer: PPO | Admitting: Physician Assistant

## 2022-10-01 ENCOUNTER — Encounter: Payer: Self-pay | Admitting: Physician Assistant

## 2022-10-01 VITALS — BP 127/68 | HR 68 | Temp 98.4°F | Resp 18 | Wt 161.0 lb

## 2022-10-01 DIAGNOSIS — T148XXA Other injury of unspecified body region, initial encounter: Secondary | ICD-10-CM | POA: Diagnosis not present

## 2022-10-01 DIAGNOSIS — G4700 Insomnia, unspecified: Secondary | ICD-10-CM | POA: Diagnosis not present

## 2022-10-01 MED ORDER — ZOLPIDEM TARTRATE 10 MG PO TABS: ORAL_TABLET | ORAL | 0 refills | Status: DC

## 2022-10-01 NOTE — Progress Notes (Signed)
I,Roshena L Chambers,acting as a Education administrator for [employer], PA-C.,have documented all relevant documentation on the behalf of Mardene Speak, PA-C,as directed by  [employer], PA-C while in the presence of [employer], PA-C.    Established patient visit   Patient: Ian Sanders   DOB: 1941-08-14   81 y.o. Male  MRN: [id number] Visit Date: 10/01/2022  Today's healthcare provider: Mardene Speak, PA-C   Chief Complaint  Patient presents with   Insomnia   Subjective    HPI  Follow up for insomnia:  The patient was last seen for this more than 1 year ago.   Current treatment includes Zolpidem. He reports good compliance with treatment. He feels that condition is Worse. He is not having side effects.   Medications: Outpatient Medications Prior to Visit  Medication Sig Note   calcium-vitamin D (OSCAL) 250-125 MG-UNIT per tablet Take 1 tablet by mouth 2 (two) times daily.     chlorthalidone (HYGROTON) 25 MG tablet Take 25 mg by mouth daily.    cyanocobalamin 1000 MCG tablet Take 1 tablet by mouth daily.    cyclobenzaprine (FLEXERIL) 10 MG tablet Take 1 tablet by mouth daily.    diltiazem (CARDIZEM CD) 120 MG 24 hr capsule Take 120 mg by mouth daily.    diltiazem (CARDIZEM) 30 MG tablet Take 1 tablet by mouth daily.    FA-B6-B12-D-Omega 3-Phytoster (ANIMI-3) 1 MG CAPS Take 1 capsule by mouth 1 day or 1 dose.    Ferrous Sulfate 134 MG TABS Take 1 tablet by mouth daily.    flecainide (TAMBOCOR) 50 MG tablet Take 1 tablet (50 mg total) by mouth 2 (two) times daily.    gabapentin (NEURONTIN) 100 MG capsule Take 1 capsule (100 mg total) by mouth 3 (three) times daily.    Leuprolide Acetate, 6 Month, (LUPRON) 45 MG injection Inject 45 mg into the muscle every 6 (six) months.    lisinopril (ZESTRIL) 5 MG tablet Take 1 tablet by mouth daily.    losartan (COZAAR) 25 MG tablet Take 1 tablet by mouth daily.    meclizine (ANTIVERT) 25 MG tablet Take 1 tablet by mouth daily.    Multiple  Vitamins-Minerals (PRESERVISION AREDS 2) CHEW Chew 1 mg by mouth daily.    omeprazole (PRILOSEC) 20 MG capsule Take 1 capsule by mouth daily.    polyvinyl alcohol (LIQUIFILM TEARS) 1.4 % ophthalmic solution 1 drop as needed.    Potassium 99 MG TABS Take 1 tablet by mouth daily.    traMADol (ULTRAM) 50 MG tablet Take 1 tablet by mouth every 6 (six) hours as needed.    Vibegron (GEMTESA) 75 MG TABS Take 75 mg by mouth daily.    zolpidem (AMBIEN) 10 MG tablet TAKE ONE-HALF TO ONE TABLET AT BEDTIME.    atenolol-chlorthalidone (TENORETIC) 50-25 MG tablet Take 1 tablet by mouth daily. (Patient not taking: Reported on 10/01/2022) 10/01/2022: Patient reports that he stopped taking this medication several years ago.   digoxin (LANOXIN) 0.125 MG tablet Take 1 tablet by mouth daily. (Patient not taking: Reported on 10/01/2022)    No facility-administered medications prior to visit.    Review of Systems  Constitutional:  Negative for appetite change, chills and fever.  Respiratory:  Negative for chest tightness, shortness of breath and wheezing.   Cardiovascular:  Negative for chest pain and palpitations.  Gastrointestinal:  Negative for abdominal pain, nausea and vomiting.  Neurological:  Negative for seizures, syncope, facial asymmetry, speech difficulty, weakness, numbness and headaches.  Objective    BP 127/68 (BP Location: Right Arm, Patient Position: Sitting, Cuff Size: Normal)   Pulse 68   Temp 98.4 F (36.9 C) (Oral)   Resp 18   Wt 161 lb (73 kg)   SpO2 98% Comment: room air  BMI 23.10 kg/m    Physical Exam Vitals reviewed.  Constitutional:      General: He is not in acute distress.    Appearance: Normal appearance. He is not diaphoretic.  HENT:     Head: Normocephalic and atraumatic.     Nose: Nose normal.  Eyes:     General: No scleral icterus.    Extraocular Movements: Extraocular movements intact.     Conjunctiva/sclera: Conjunctivae normal.     Pupils: Pupils are  equal, round, and reactive to light.  Cardiovascular:     Rate and Rhythm: Normal rate and regular rhythm.     Pulses: Normal pulses.     Heart sounds: Normal heart sounds. No murmur heard. Pulmonary:     Effort: Pulmonary effort is normal. No respiratory distress.     Breath sounds: Normal breath sounds. No wheezing or rhonchi.  Musculoskeletal:        General: Normal range of motion.     Cervical back: Normal range of motion and neck supple.     Right lower leg: No edema.     Left lower leg: No edema.  Lymphadenopathy:     Cervical: No cervical adenopathy.  Skin:    General: Skin is warm and dry.     Capillary Refill: Capillary refill takes less than 2 seconds.     Findings: Bruising (healing on left side of the face 2/2 after hitting his head 2-3 weeks ago) present. No rash.  Neurological:     General: No focal deficit present.     Mental Status: He is alert and oriented to person, place, and time. Mental status is at baseline.     Cranial Nerves: No cranial nerve deficit.     Sensory: No sensory deficit.     Motor: No weakness.  Psychiatric:        Mood and Affect: Mood normal.        Behavior: Behavior normal.        Thought Content: Thought content normal.        Judgment: Judgment normal.      No results found for any visits on 10/01/22.  Assessment & Plan     Insomnia, unspecified type Pt has been having trouble with sleeping and has been taking zolpidem with significant relief. Will refill a medication for a mo - zolpidem (AMBIEN) 10 MG tablet; TAKE ONE-HALF TO ONE TABLET AT BEDTIME.  Dispense: 30 tablet; Refill: 0  Benefits and risks were discussed. Patient agreed and expressed his understanding.    Pt requested to schedule an appt with Dr.Fisher as he was not able to see his PCP since January of this year. Fu with Dr. Caryn Section for chronic problems were scheduled for December of 2023.   Bruising After hitting his head X 2-3 weeks ago /Left forehead and  underneath of left eye. Healing, almost healed Normal neuro exam , normal vitals, asymptomatic Pt did not see any medical specialist this problem Believed that it resolved and he did not need any evaluation for this problem at this visit. Will see an ophthalmologist for his regular eye exam on 10/02/22. The patient was advised to call back or seek an in-person evaluation if the symptoms worsen or  if the condition fails to improve as anticipated.  I discussed the assessment and treatment plan with the patient. The patient was provided an opportunity to ask questions and all were answered. The patient agreed with the plan and demonstrated an understanding of the instructions.  The entirety of the information documented in the History of Present Illness, Review of Systems and Physical Exam were personally obtained by me. Portions of this information were initially documented by the CMA and reviewed by me for thoroughness and accuracy.    Mardene Speak, [facility], [place] [phone] (phone) [phone] (fax)

## 2022-10-02 ENCOUNTER — Inpatient Hospital Stay: Payer: PPO | Admitting: Internal Medicine

## 2022-10-02 ENCOUNTER — Inpatient Hospital Stay: Payer: PPO

## 2022-10-02 DIAGNOSIS — H353211 Exudative age-related macular degeneration, right eye, with active choroidal neovascularization: Secondary | ICD-10-CM | POA: Diagnosis not present

## 2022-10-12 DIAGNOSIS — G20C Parkinsonism, unspecified: Secondary | ICD-10-CM | POA: Diagnosis not present

## 2022-10-12 DIAGNOSIS — I480 Paroxysmal atrial fibrillation: Secondary | ICD-10-CM | POA: Diagnosis not present

## 2022-10-12 DIAGNOSIS — G3184 Mild cognitive impairment, so stated: Secondary | ICD-10-CM | POA: Diagnosis not present

## 2022-10-12 DIAGNOSIS — Z8673 Personal history of transient ischemic attack (TIA), and cerebral infarction without residual deficits: Secondary | ICD-10-CM | POA: Diagnosis not present

## 2022-10-12 DIAGNOSIS — Z8679 Personal history of other diseases of the circulatory system: Secondary | ICD-10-CM | POA: Diagnosis not present

## 2022-10-12 DIAGNOSIS — I729 Aneurysm of unspecified site: Secondary | ICD-10-CM | POA: Diagnosis not present

## 2022-10-12 DIAGNOSIS — E119 Type 2 diabetes mellitus without complications: Secondary | ICD-10-CM | POA: Diagnosis not present

## 2022-10-14 DIAGNOSIS — I10 Essential (primary) hypertension: Secondary | ICD-10-CM | POA: Diagnosis not present

## 2022-10-14 DIAGNOSIS — Z515 Encounter for palliative care: Secondary | ICD-10-CM | POA: Diagnosis not present

## 2022-10-14 DIAGNOSIS — Z6823 Body mass index (BMI) 23.0-23.9, adult: Secondary | ICD-10-CM | POA: Diagnosis not present

## 2022-10-26 DIAGNOSIS — I10 Essential (primary) hypertension: Secondary | ICD-10-CM | POA: Diagnosis not present

## 2022-10-26 DIAGNOSIS — R001 Bradycardia, unspecified: Secondary | ICD-10-CM | POA: Diagnosis not present

## 2022-10-26 DIAGNOSIS — Z8673 Personal history of transient ischemic attack (TIA), and cerebral infarction without residual deficits: Secondary | ICD-10-CM | POA: Diagnosis not present

## 2022-10-26 DIAGNOSIS — I480 Paroxysmal atrial fibrillation: Secondary | ICD-10-CM | POA: Diagnosis not present

## 2022-10-26 DIAGNOSIS — R002 Palpitations: Secondary | ICD-10-CM | POA: Diagnosis not present

## 2022-10-26 DIAGNOSIS — G629 Polyneuropathy, unspecified: Secondary | ICD-10-CM | POA: Diagnosis not present

## 2022-10-30 ENCOUNTER — Encounter: Payer: Self-pay | Admitting: Family Medicine

## 2022-10-30 ENCOUNTER — Ambulatory Visit (INDEPENDENT_AMBULATORY_CARE_PROVIDER_SITE_OTHER): Payer: PPO | Admitting: Family Medicine

## 2022-10-30 VITALS — BP 131/66 | HR 55 | Temp 98.2°F | Resp 16 | Wt 163.9 lb

## 2022-10-30 DIAGNOSIS — I10 Essential (primary) hypertension: Secondary | ICD-10-CM | POA: Diagnosis not present

## 2022-10-30 DIAGNOSIS — L989 Disorder of the skin and subcutaneous tissue, unspecified: Secondary | ICD-10-CM | POA: Diagnosis not present

## 2022-10-30 DIAGNOSIS — R1031 Right lower quadrant pain: Secondary | ICD-10-CM | POA: Diagnosis not present

## 2022-10-30 DIAGNOSIS — G4700 Insomnia, unspecified: Secondary | ICD-10-CM

## 2022-10-30 MED ORDER — CHLORTHALIDONE 25 MG PO TABS: 25.0000 mg | ORAL_TABLET | Freq: Every day | ORAL | 0 refills | Status: DC

## 2022-10-30 NOTE — Progress Notes (Signed)
I,Sulibeya S Dimas,acting as a scribe for Lelon Huh, MD.,have documented all relevant documentation on the behalf of Lelon Huh, MD,as directed by  Lelon Huh, MD while in the presence of Lelon Huh, MD.     Established patient visit   Patient: Ian Sanders   DOB: 06/08/1941   81 y.o. Male  MRN: [id number] Visit Date: 10/30/2022  Today's healthcare provider: Lelon Huh, MD   Chief Complaint  Patient presents with   Hypertension   Insomnia   Hernia   Rash   Subjective    HPI  Hypertension, follow-up  BP Readings from Last 3 Encounters:  10/30/22 131/66  10/01/22 127/68  06/04/22 (!) 174/72   Wt Readings from Last 3 Encounters:  10/30/22 163 lb 14.4 oz (74.3 kg)  10/01/22 161 lb (73 kg)  06/04/22 167 lb 12.8 oz (76.1 kg)     He was last seen for hypertension 11 months ago.  Management since that visit includes; Well controlled.  Continue current medications. Marland Kitchen  He reports excellent compliance with treatment. He is not having side effects.   Use of agents associated with hypertension: none.   Outside blood pressures are stable.   Pertinent labs Lab Results  Component Value Date   CHOL 124 07/13/2021   HDL 32 (L) 07/13/2021   LDLCALC 71 07/13/2021   TRIG 104 07/13/2021   CHOLHDL 3.9 07/13/2021   Lab Results  Component Value Date   NA 130 (L) 03/30/2022   K 3.8 03/30/2022   CREATININE 0.75 03/30/2022   GFRNONAA >60 03/30/2022   GLUCOSE 134 (H) 03/30/2022   TSH 3.260 04/17/2016      --------------------------------------------------------------------------------------------------- Follow up for insomnia  The patient was last seen for this 4 weeks ago. Changes made at last visit include no changes. Refilled Ambien 10 mg #30 no refills.   He reports excellent compliance with treatment. He feels that condition is Unchanged. He is not having side effects. Patient denies any side effects.    ----------------------------------------------------------------------------------------- Patient reports he believes his hernia is back. Reports pain in right lower groin area. Has been been swollen with dull pain.   Patient reports a scab on his left back that wont heal. Patient reports scab x 1 year.  Patient denies any redness, pain or discharge.   Medications: Outpatient Medications Prior to Visit  Medication Sig   calcium-vitamin D (OSCAL) 250-125 MG-UNIT per tablet Take 1 tablet by mouth 2 (two) times daily.    chlorthalidone (HYGROTON) 25 MG tablet Take 25 mg by mouth daily.   cyanocobalamin 1000 MCG tablet Take 1 tablet by mouth daily.   cyclobenzaprine (FLEXERIL) 10 MG tablet Take 1 tablet by mouth daily.   diltiazem (CARDIZEM CD) 120 MG 24 hr capsule Take 120 mg by mouth daily.   diltiazem (CARDIZEM) 30 MG tablet Take 1 tablet by mouth daily.   FA-B6-B12-D-Omega 3-Phytoster (ANIMI-3) 1 MG CAPS Take 1 capsule by mouth 1 day or 1 dose.   Ferrous Sulfate 134 MG TABS Take 1 tablet by mouth daily.   flecainide (TAMBOCOR) 50 MG tablet Take 1 tablet (50 mg total) by mouth 2 (two) times daily.   gabapentin (NEURONTIN) 100 MG capsule Take 1 capsule (100 mg total) by mouth 3 (three) times daily.   Leuprolide Acetate, 6 Month, (LUPRON) 45 MG injection Inject 45 mg into the muscle every 6 (six) months.   losartan (COZAAR) 25 MG tablet Take 1 tablet by mouth daily.   meclizine (ANTIVERT) 25 MG tablet Take  1 tablet by mouth daily.   Multiple Vitamins-Minerals (PRESERVISION AREDS 2) CHEW Chew 1 mg by mouth daily.   omeprazole (PRILOSEC) 20 MG capsule Take 1 capsule by mouth daily.   polyvinyl alcohol (LIQUIFILM TEARS) 1.4 % ophthalmic solution 1 drop as needed.   Potassium 99 MG TABS Take 1 tablet by mouth daily.   traMADol (ULTRAM) 50 MG tablet Take 1 tablet by mouth every 6 (six) hours as needed.   Vibegron (GEMTESA) 75 MG TABS Take 75 mg by mouth daily.   zolpidem (AMBIEN) 10 MG tablet  TAKE ONE-HALF TO ONE TABLET AT BEDTIME.   No facility-administered medications prior to visit.    Review of Systems  Constitutional:  Negative for appetite change, chills and fever.  Eyes:  Negative for visual disturbance.  Respiratory:  Negative for chest tightness, shortness of breath and wheezing.   Cardiovascular:  Positive for leg swelling. Negative for chest pain and palpitations.  Gastrointestinal:  Positive for abdominal pain and constipation. Negative for blood in stool, nausea and vomiting.  Genitourinary:  Negative for dysuria.  Musculoskeletal:  Positive for gait problem.  Skin:  Positive for color change and rash.  Neurological:  Negative for dizziness, light-headedness and headaches.  Hematological:  Bruises/bleeds easily.       Objective    BP 131/66 (BP Location: Left Arm, Patient Position: Sitting, Cuff Size: Large)   Pulse (!) 55   Temp 98.2 F (36.8 C) (Oral)   Resp 16   Wt 163 lb 14.4 oz (74.3 kg)   SpO2 99%   BMI 23.52 kg/m  BP Readings from Last 3 Encounters:  10/30/22 131/66  10/01/22 127/68  06/04/22 (!) 174/72   Wt Readings from Last 3 Encounters:  10/30/22 163 lb 14.4 oz (74.3 kg)  10/01/22 161 lb (73 kg)  06/04/22 167 lb 12.8 oz (76.1 kg)   Physical Exam   Approx 1cm crusty pigmented well circumscribed skin lesion left lower back.  Slight right inguinal bulge, minimal tenderness.     Assessment & Plan     1. Right groin pain 1-2 weeks onset with history of hernia pain on same side several years. Exam is equivocal. Consider surgery referral if he develops progressive swelling or if becomes more persistently painful.   2. Primary hypertension Well controlled.  Continue current medications.    3. Skin lesion of back Non-healing lesion that does not have highly suspicious appearance, but he states it has not healed over the last year and he denies picking at it at all. Recommend evaluation by dermatologist. He states he will call and make  appointment himself.   4. Insomnia, unspecified type Doing well with prn zolpidem      The entirety of the information documented in the History of Present Illness, Review of Systems and Physical Exam were personally obtained by me. Portions of this information were initially documented by the CMA and reviewed by me for thoroughness and accuracy.     Lelon Huh, MD  [facility] [phone] (phone) [phone] (fax)  [place]

## 2022-11-12 DIAGNOSIS — J069 Acute upper respiratory infection, unspecified: Secondary | ICD-10-CM | POA: Diagnosis not present

## 2022-11-19 DIAGNOSIS — H353221 Exudative age-related macular degeneration, left eye, with active choroidal neovascularization: Secondary | ICD-10-CM | POA: Diagnosis not present

## 2022-11-19 DIAGNOSIS — H353211 Exudative age-related macular degeneration, right eye, with active choroidal neovascularization: Secondary | ICD-10-CM | POA: Diagnosis not present

## 2022-11-26 ENCOUNTER — Other Ambulatory Visit: Payer: Self-pay | Admitting: Family Medicine

## 2022-11-26 NOTE — Telephone Encounter (Signed)
Requested medication (s) are due for refill today: routing for approval  Requested medication (s) are on the active medication list: yes  Last refill:  06/04/22  Future visit scheduled: yes  Notes to clinic:  Unable to refill per protocol, cannot delegate.      Requested Prescriptions  Pending Prescriptions Disp Refills   traMADol (ULTRAM) 50 MG tablet [Pharmacy Med Name: TRAMADOL HCL 50 MG TABLET] 30 tablet 0    Sig: Take 1 tablet (50 mg total) by mouth every 6 (six) hours as needed.     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

## 2022-11-27 NOTE — Telephone Encounter (Signed)
Requested medication (s) are due for refill today -unknown  Requested medication (s) are on the active medication list -yes  Future visit scheduled -yes  Last refill: unknown  Notes to clinic: medication listed as historical, non delegated Rx  Requested Prescriptions  Pending Prescriptions Disp Refills   traMADol (ULTRAM) 50 MG tablet [Pharmacy Med Name: TRAMADOL HCL 50 MG TABLET] 30 tablet 0    Sig: Take 1 tablet (50 mg total) by mouth every 6 (six) hours as needed.     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

## 2022-12-01 ENCOUNTER — Other Ambulatory Visit: Payer: Self-pay | Admitting: Family Medicine

## 2022-12-01 DIAGNOSIS — G8929 Other chronic pain: Secondary | ICD-10-CM

## 2022-12-01 NOTE — Telephone Encounter (Signed)
Requested Prescriptions  Pending Prescriptions Disp Refills   gabapentin (NEURONTIN) 100 MG capsule [Pharmacy Med Name: GABAPENTIN 100 MG CAPSULE] 90 capsule 0    Sig: Take 1 capsule (100 mg total) by mouth 3 (three) times daily.     Neurology: Anticonvulsants - gabapentin Passed - 12/01/2022 10:57 AM      Passed - Cr in normal range and within 360 days    Creatinine  Date Value Ref Range Status  08/18/2012 1.36 (H) 0.60 - 1.30 mg/dL Final   Creatinine, Ser  Date Value Ref Range Status  03/30/2022 0.75 0.61 - 1.24 mg/dL Final   Creatinine, POC  Date Value Ref Range Status  04/27/2017 n/a mg/dL Final         Passed - Completed PHQ-2 or PHQ-9 in the last 360 days      Passed - Valid encounter within last 12 months    Recent Outpatient Visits           1 month ago Right groin pain   [place], MD   2 months ago Insomnia, unspecified type   [facility] [place], [place], PA-C   1 year ago Primary hypertension   [facility] Birdie Sons, MD   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

## 2022-12-03 ENCOUNTER — Other Ambulatory Visit: Payer: Self-pay | Admitting: Urology

## 2022-12-03 DIAGNOSIS — N3941 Urge incontinence: Secondary | ICD-10-CM

## 2022-12-23 ENCOUNTER — Other Ambulatory Visit: Payer: Self-pay | Admitting: Physician Assistant

## 2022-12-23 DIAGNOSIS — G4700 Insomnia, unspecified: Secondary | ICD-10-CM

## 2022-12-24 NOTE — Telephone Encounter (Signed)
Requested medication (s) are due for refill today: yes  Requested medication (s) are on the active medication list: yes  Last refill:  10/01/22  Future visit scheduled: yes  Notes to clinic:  Unable to refill per protocol, cannot delegate.      Requested Prescriptions  Pending Prescriptions Disp Refills   zolpidem (AMBIEN) 10 MG tablet [Pharmacy Med Name: ZOLPIDEM TARTRATE 10 MG TABLET] 30 tablet 0    Sig: TAKE ONE-HALF TO ONE TABLET AT BEDTIME.     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

## 2022-12-29 ENCOUNTER — Ambulatory Visit: Payer: PPO | Admitting: Physician Assistant

## 2022-12-30 ENCOUNTER — Encounter: Payer: Self-pay | Admitting: Urology

## 2022-12-30 ENCOUNTER — Ambulatory Visit (INDEPENDENT_AMBULATORY_CARE_PROVIDER_SITE_OTHER): Payer: PPO | Admitting: Urology

## 2022-12-30 VITALS — BP 150/71 | HR 62 | Ht 70.0 in | Wt 153.0 lb

## 2022-12-30 DIAGNOSIS — N393 Stress incontinence (female) (male): Secondary | ICD-10-CM

## 2022-12-30 DIAGNOSIS — R32 Unspecified urinary incontinence: Secondary | ICD-10-CM | POA: Diagnosis not present

## 2022-12-30 DIAGNOSIS — C61 Malignant neoplasm of prostate: Secondary | ICD-10-CM

## 2022-12-30 MED ORDER — LEUPROLIDE ACETATE (6 MONTH) 45 MG IM KIT: 45.0000 mg | PACK | Freq: Once | INTRAMUSCULAR | Status: DC

## 2022-12-30 MED ORDER — LEUPROLIDE ACETATE (6 MONTH) 45 MG ~~LOC~~ KIT
45.0000 mg | PACK | Freq: Once | SUBCUTANEOUS | Status: AC
  Administered 2022-12-30: 45 mg via SUBCUTANEOUS

## 2022-12-30 MED ORDER — TROSPIUM CHLORIDE 20 MG PO TABS: 20.0000 mg | ORAL_TABLET | Freq: Two times a day (BID) | ORAL | 0 refills | Status: DC

## 2022-12-30 NOTE — Progress Notes (Signed)
12/30/2022 2:05 PM   Ian Sanders [birth year] [id number]  Referring provider: Birdie Sons, [place] [place],  [place] [address]  Chief Complaint  Patient presents with   Prostate Cancer   Urological history 1. Prostate cancer -PSA <0.1 in 06/2021 -RRP by Dr. Domingo Cocking at [facility] in 1995 -PSA remained below 1 until 2014 when it rose to 9.8 ng/mL -started on ADT with [place] in 03/15  HPI: 82 y.o. male presents for 2-monthfollow-up.    PSA has remained undetectable since starting ADT.  He is thinking of discontinuing hormonal therapy He was started on Lupron prior to Epic EHR but this was apparently started for biochemical recurrence and no objective evidence of metastatic disease He has chronic urinary incontinence which sounds more stress related secondary to prior prostatectomy Most recently tried on Gemtesa which he states was not effective Denies dysuria or gross hematuria   PMH: Past Medical History:  Diagnosis Date   Atrial fibrillation (HCC)    Basal cell carcinoma    Benign paroxysmal positional vertigo    [place] ([facility]    History of chicken pox    History of measles    History of mumps    Hypertension    Mononeuritis of unspecified site    Other seborrheic keratosis    Stroke ([facility] 06/2020   Transient ischemic attack (TIA), and cerebral infarction without residual deficits(V12.54)    Traumatic subdural hematoma without loss of consciousness ([facility] 06/25/2020   Unspecified hypothyroidism     Surgical History: Past Surgical History:  Procedure Laterality Date   Carotid Doppler Ultrasound  04/02/2010   Small amount Calcified plaque bilaterally, no significant stenosis.   CATARACT EXTRACTION W/PHACO Right 04/15/2021   Procedure: CATARACT EXTRACTION PHACO AND INTRAOCULAR LENS PLACEMENT ([facility] RIGHT;  Surgeon: PBirder Robson MD;  [place] [facility]  Service: Ophthalmology;  Laterality: Right;  7.98 0:47.2   CATARACT EXTRACTION  W/PHACO Left 05/20/2021   Procedure: CATARACT EXTRACTION PHACO AND INTRAOCULAR LENS PLACEMENT (IOC) LEFT 3.37 00:32.0;  Surgeon: PBirder Robson MD;  [place]  Service: Ophthalmology;  Laterality: Left;  Requests arrival 10A or after   COLONOSCOPY  05/11/2014   Tubular Adenoma. Dr. WAllen Norris  COLONOSCOPY WITH PROPOFOL N/A 05/23/2018   Procedure: COLONOSCOPY WITH PROPOFOL;  Surgeon: AJonathon Bellows MD;  [facility]  Service: Gastroenterology;  Laterality: N/A;   DOPPLER ECHOCARDIOGRAPHY  04/02/2010   Mild left atrial dilation. Normal right atrium. No valvular disease. No thrombus. Normal LV function. EF>55%   ESOPHAGOGASTRODUODENOSCOPY (EGD) WITH PROPOFOL N/A 08/16/2019   Procedure: ESOPHAGOGASTRODUODENOSCOPY (EGD) WITH PROPOFOL;  Surgeon: AJonathon Bellows MD;  [facility]  Service: Gastroenterology;  Laterality: N/A;   FINGER AMPUTATION     HERNIA REPAIR Right    MRI Brain with and without contrast  03/20/2010   Suggestive of basal ganglia lacunar infarct   PROSTATECTOMY  11/16/1993   Abdominal   SKIN SURGERY     multiple   Thumb surgery      Home Medications:  Allergies as of 12/30/2022       Reactions   Lisinopril Swelling   Tongue swelling   Myrbetriq [mirabegron] Swelling   Of the tongue   Beta Adrenergic Blockers    Other Reaction(s): Other (See Comments) Symptomatic bradycardia   Other Other (See Comments)   Symptomatic bradycardia    Triamterene    Other reaction(s): ITCHING,WATERING EYES Other Reaction(s): ITCHING,WATERING EYES   Amlodipine Other (See Comments)  Peripheral edema   Atorvastatin Other (See Comments)   Dizziness and Fatigue   Dabigatran Etexilate Mesylate Other (See Comments)   Stomach ulcers (Pradaxa)   Dabigatran Etexilate Mesylate Other (See Comments)   Stomach ulcers (Pradaxa)        Medication List        Accurate as of December 30, 2022  2:05 PM. If you have any questions, ask your nurse or  doctor.          Animi-3 1 MG Caps Take 1 capsule by mouth 1 day or 1 dose.   calcium-vitamin D 250-125 MG-UNIT tablet Commonly known as: OSCAL Take 1 tablet by mouth 2 (two) times daily.   chlorthalidone 25 MG tablet Commonly known as: HYGROTON Take 1 tablet (25 mg total) by mouth daily. Take 25 mg by mouth daily.   cyanocobalamin 1000 MCG tablet Take 1 tablet by mouth daily.   cyclobenzaprine 10 MG tablet Commonly known as: FLEXERIL Take 1 tablet by mouth daily.   diltiazem 120 MG 24 hr capsule Commonly known as: CARDIZEM CD Take 120 mg by mouth daily.   diltiazem 30 MG tablet Commonly known as: CARDIZEM Take 1 tablet by mouth daily.   Ferrous Sulfate 134 MG Tabs Take 1 tablet by mouth daily.   flecainide 50 MG tablet Commonly known as: TAMBOCOR Take 1 tablet (50 mg total) by mouth 2 (two) times daily.   gabapentin 100 MG capsule Commonly known as: NEURONTIN Take 1 capsule (100 mg total) by mouth 3 (three) times daily.   Gemtesa 75 MG Tabs Generic drug: Vibegron Take 75 mg(ONE TABLET) by mouth daily.   Leuprolide Acetate (6 Month) 45 MG injection Commonly known as: LUPRON Inject 45 mg into the muscle every 6 (six) months.   losartan 25 MG tablet Commonly known as: COZAAR Take 1 tablet by mouth daily.   meclizine 25 MG tablet Commonly known as: ANTIVERT Take 1 tablet by mouth daily.   omeprazole 20 MG capsule Commonly known as: PRILOSEC Take 1 capsule by mouth daily.   polyvinyl alcohol 1.4 % ophthalmic solution Commonly known as: LIQUIFILM TEARS 1 drop as needed.   Potassium 99 MG Tabs Take 1 tablet by mouth daily.   PreserVision AREDS 2 Chew Chew 1 mg by mouth daily.   traMADol 50 MG tablet Commonly known as: ULTRAM Take 1 tablet (50 mg total) by mouth every 6 (six) hours as needed.   zolpidem 10 MG tablet Commonly known as: AMBIEN TAKE ONE-HALF TO ONE TABLET AT BEDTIME.        Allergies:  Allergies  Allergen Reactions    Lisinopril Swelling    Tongue swelling   Myrbetriq [Mirabegron] Swelling    Of the tongue   Beta Adrenergic Blockers     Other Reaction(s): Other (See Comments)  Symptomatic bradycardia   Other Other (See Comments)    Symptomatic bradycardia    Triamterene     Other reaction(s): ITCHING,WATERING EYES  Other Reaction(s): ITCHING,WATERING EYES   Amlodipine Other (See Comments)    Peripheral edema   Atorvastatin Other (See Comments)    Dizziness and Fatigue   Dabigatran Etexilate Mesylate Other (See Comments)    Stomach ulcers (Pradaxa)   Dabigatran Etexilate Mesylate Other (See Comments)    Stomach ulcers (Pradaxa)    Family History: Family History  Problem Relation Age of Onset   Heart disease Mother 87       CABG   Hypertension Mother    Heart attack Mother  Hypertension Brother    Heart disease Sister        stents   Prostate cancer Brother    Kidney cancer Brother    Skin cancer Brother    Hypertension Brother    Bladder Cancer Sister    Leukemia Father    Stroke Father        multiple   Cerebral aneurysm Sister    Kidney cancer Brother     Social History:  reports that he has quit smoking. His smoking use included cigarettes. His smokeless tobacco use includes chew. He reports that he does not currently use alcohol after a past usage of about 5.0 standard drinks of alcohol per week. He reports that he does not use drugs.   Physical Exam: BP (!) 150/71   Pulse 62   Ht 5' 10"$  (1.778 m)   Wt 153 lb (69.4 kg)   BMI 21.95 kg/m   Constitutional:  Alert and oriented, No acute distress. HEENT: [place] AT Cardiovascular: No clubbing, cyanosis, or edema. Respiratory: Normal respiratory effort, no increased work of breathing. Psychiatric: Normal mood and affect.   Assessment & Plan:    1. Prostate cancer [facility]) He received leuprolide today. PSA drawn He desires to discontinue leuprolide and will have him follow-up in 6 months for a repeat PSA.  We discussed  reinstitution of ADT would be recommended for a rising PSA  2.  Urinary incontinence Most bothersome symptoms are stress related which may be exacerbated by urethral atrophy  He is interested in talking with physical therapy regarding any benefit he may have with pelvic floor physical therapy *** He requested additional medication to try.  I discussed this will not help stress incontinence but he was given a 30-day trial of trospium 20 mg twice daily in the event this helps any urge symptoms he may have   Abbie Sons, [place] [address], [place], [place] [address] [phone]

## 2022-12-30 NOTE — Progress Notes (Signed)
Eligard SubQ Injection    Due to Prostate Cancer patient is present today for a Eligard Injection.   Medication: Eligard 6 month Dose: 45 mg  Location: left buttock Lot: [id number] Exp:12/2023   Patient tolerated well, no complications were noted   Performed by:    Elberta Leatherwood CMA   Per Dr. Bernardo Heater patient states he is stopping his eligard injections. .  This appointment was scheduled using wheel and given to patient today along with reminder continue on Vitamin D 800-1000iu and Calcium 1000-1263m daily while on Androgen Deprivation Therapy.  PA approval dates: No PA required

## 2022-12-31 DIAGNOSIS — H353211 Exudative age-related macular degeneration, right eye, with active choroidal neovascularization: Secondary | ICD-10-CM | POA: Diagnosis not present

## 2023-01-13 ENCOUNTER — Other Ambulatory Visit: Payer: Self-pay

## 2023-01-13 ENCOUNTER — Other Ambulatory Visit: Payer: PPO

## 2023-01-13 ENCOUNTER — Ambulatory Visit (INDEPENDENT_AMBULATORY_CARE_PROVIDER_SITE_OTHER): Payer: PPO

## 2023-01-13 VITALS — Ht 70.0 in | Wt 160.0 lb

## 2023-01-13 DIAGNOSIS — Z0000 Encounter for general adult medical examination without abnormal findings: Secondary | ICD-10-CM | POA: Diagnosis not present

## 2023-01-13 DIAGNOSIS — C61 Malignant neoplasm of prostate: Secondary | ICD-10-CM

## 2023-01-13 NOTE — Progress Notes (Signed)
I connected with  Ian Sanders on 01/13/23 by a audio/telephone and verified that I am speaking with the correct person using two identifiers.  Patient Location: Home  Provider Location: Office/Clinic  I discussed the limitations of evaluation and management by telemedicine. The patient expressed understanding and agreed to proceed.  Subjective:   Ian Sanders is a 82 y.o. male who presents for Medicare Annual/Subsequent preventive examination.  Review of Systems    Cardiac Risk Factors include: advanced age (>76mn, >>7women);hypertension;male gender;dyslipidemia;sedentary lifestyle     Objective:    Today's Vitals   01/13/23 1309  Weight: 160 lb (72.6 kg)  Height: '5\' 10"'$  (1.778 m)   Body mass index is 22.96 kg/m.     01/13/2023    1:20 PM 03/30/2022    2:11 PM 01/12/2022    1:51 PM 09/19/2021    3:02 PM 07/12/2021    7:29 PM 05/20/2021   12:52 PM 09/27/2020   11:40 AM  Advanced Directives  Does Patient Have a Medical Advance Directive? Yes No No Yes No Yes Yes  Type of ACorporate treasurerof ACecilLiving will  [place] will  [place]Living will [place] will  Does patient want to make changes to medical advance directive?    No - Patient declined  No - Patient declined No - Patient declined  Copy of [place]um [place] Chart?      No - copy requested No - copy requested  Would patient like information on creating a medical advance directive?   No - Patient declined  No - Patient declined      Current Medications (verified) Outpatient Encounter Medications as of 01/13/2023  Medication Sig   calcium-vitamin D (OSCAL) 250-125 MG-UNIT per tablet Take 1 tablet by mouth 2 (two) times daily.    chlorthalidone (HYGROTON) 25 MG tablet Take 1 tablet (25 mg total) by mouth daily. Take 25 mg by mouth daily.   cyanocobalamin 1000 MCG tablet Take 1 tablet by mouth daily.    cyclobenzaprine (FLEXERIL) 10 MG tablet Take 1 tablet by mouth daily.   diltiazem (CARDIZEM CD) 120 MG 24 hr capsule Take 120 mg by mouth daily.   diltiazem (CARDIZEM) 30 MG tablet Take 1 tablet by mouth daily.   FA-B6-B12-D-Omega 3-Phytoster (ANIMI-3) 1 MG CAPS Take 1 capsule by mouth 1 day or 1 dose.   Ferrous Sulfate 134 MG TABS Take 1 tablet by mouth daily.   gabapentin (NEURONTIN) 100 MG capsule Take 1 capsule (100 mg total) by mouth 3 (three) times daily.   Leuprolide Acetate, 6 Month, (LUPRON) 45 MG injection Inject 45 mg into the muscle every 6 (six) months.   losartan (COZAAR) 25 MG tablet Take 1 tablet by mouth daily.   meclizine (ANTIVERT) 25 MG tablet Take 1 tablet by mouth daily.   omeprazole (PRILOSEC) 20 MG capsule Take 1 capsule by mouth daily.   polyvinyl alcohol (LIQUIFILM TEARS) 1.4 % ophthalmic solution 1 drop as needed.   Potassium 99 MG TABS Take 1 tablet by mouth daily.   traMADol (ULTRAM) 50 MG tablet Take 1 tablet (50 mg total) by mouth every 6 (six) hours as needed.   trospium (SANCTURA) 20 MG tablet Take 1 tablet (20 mg total) by mouth 2 (two) times daily.   zolpidem (AMBIEN) 10 MG tablet TAKE ONE-HALF TO ONE TABLET AT BEDTIME.   flecainide (TAMBOCOR) 50 MG tablet Take 1 tablet (50 mg total) by  mouth 2 (two) times daily. (Patient not taking: Reported on 01/13/2023)   GEMTESA 75 MG TABS Take 75 mg(ONE TABLET) by mouth daily. (Patient not taking: Reported on 01/13/2023)   Multiple Vitamins-Minerals (PRESERVISION AREDS 2) CHEW Chew 1 mg by mouth daily. (Patient not taking: Reported on 01/13/2023)   No facility-administered encounter medications on file as of 01/13/2023.    Allergies (verified) Lisinopril, Myrbetriq [mirabegron], Beta adrenergic blockers, Other, Triamterene, Amlodipine, Atorvastatin, Dabigatran etexilate mesylate, and Dabigatran etexilate mesylate   History: Past Medical History:  Diagnosis Date   Atrial fibrillation (HCC)    Basal cell carcinoma     Benign paroxysmal positional vertigo    Cancer (HCC)    History of chicken pox    History of measles    History of mumps    Hypertension    Mononeuritis of unspecified site    Other seborrheic keratosis    Stroke ([place]) 06/2020   Transient ischemic attack (TIA), and cerebral infarction without residual deficits(V12.54)    Traumatic subdural hematoma without loss of consciousness ([place]) 06/25/2020   Unspecified hypothyroidism    Past Surgical History:  Procedure Laterality Date   Carotid Doppler Ultrasound  04/02/2010   Small amount Calcified plaque bilaterally, no significant stenosis.   CATARACT EXTRACTION W/PHACO Right 04/15/2021   Procedure: CATARACT EXTRACTION PHACO AND INTRAOCULAR LENS PLACEMENT ([place]) RIGHT;  Surgeon: Birder Robson, MD;  Location: [place];  Service: Ophthalmology;  Laterality: Right;  7.98 0:47.2   CATARACT EXTRACTION W/PHACO Left 05/20/2021   Procedure: CATARACT EXTRACTION PHACO AND INTRAOCULAR LENS PLACEMENT ([facility]) LEFT 3.37 00:32.0;  Surgeon: Birder Robson, MD;  Location: [place];  Service: Ophthalmology;  Laterality: Left;  Requests arrival 10A or after   COLONOSCOPY  05/11/2014   Tubular Adenoma. Dr. Allen Norris   COLONOSCOPY WITH PROPOFOL N/A 05/23/2018   Procedure: COLONOSCOPY WITH PROPOFOL;  Surgeon: [name], MD;  Location[facility] ENDOSCOPY;  Service: Gastroenterology;  Laterality: N/A;   DOPPLER ECHOCARDIOGRAPHY  04/02/2010   Mild left atrial dilation. Normal right atrium. No valvular disease. No thrombus. Normal LV function. EF>55%   ESOPHAGOGASTRODUODENOSCOPY (EGD) WITH PROPOFOL N/A 08/16/2019   Procedure: ESOPHAGOGASTRODUODENOSCOPY (EGD) WITH PROPOFOL;  Surgeon: [name], MD;  Location: [facility] ENDOSCOPY;  Service: Gastroenterology;  Laterality: N/A;   FINGER AMPUTATION     HERNIA REPAIR Right    MRI Brain with and without contrast  03/20/2010   Suggestive of basal ganglia lacunar infarct   PROSTATECTOMY  11/16/1993    Abdominal   SKIN SURGERY     multiple   Thumb surgery     Family History  Problem Relation Age of Onset   Heart disease Mother 65       CABG   Hypertension Mother    Heart attack Mother    Hypertension Brother    Heart disease Sister        stents   Prostate cancer Brother    Kidney cancer Brother    Skin cancer Brother    Hypertension Brother    Bladder Cancer Sister    Leukemia Father    Stroke Father        multiple   Cerebral aneurysm Sister    Kidney cancer Brother    Social History   Socioeconomic History   Marital status: Married    Spouse name: Not on file   Number of children: 2   Years of education: Not on file   Highest education level: Associate degree: occupational, Hotel manager, or vocational program  Occupational  History   Occupation: Retired  Tobacco Use   Smoking status: Former    Packs/day: 0.00    Years: 12.00    Total pack years: 0.00    Types: Cigarettes   Smokeless tobacco: Current    Types: Chew   Tobacco comments:    quit over 45 years ago  Vaping Use   Vaping Use: Never used  Substance and Sexual Activity   Alcohol use: Not Currently    Alcohol/week: 5.0 standard drinks of alcohol    Types: 5 Cans of beer per week    Comment: quit drinking summer 2021   Drug use: No   Sexual activity: Not on file  Other Topics Concern   Not on file  Social History Narrative   Lives with wife at home ; alcohol abuse; quit smoking 2016; 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   Social Determinants of Health   Financial Resource Strain: Low Risk  (01/13/2023)   Overall Financial Resource Strain (CARDIA)    Difficulty of Paying Living Expenses: Not hard at all  Food Insecurity: No Food Insecurity (01/13/2023)   Hunger Vital Sign    Worried About Running Out of Food in the Last Year: Never true    Ran Out of Food in the Last Year: Never true  Transportation Needs: No Transportation Needs (01/13/2023)   PRAPARE - Radiographer, therapeutic (Medical): No    Lack of Transportation (Non-Medical): No  Physical Activity: Inactive (01/13/2023)   Exercise Vital Sign    Days of Exercise per Week: 0 days    Minutes of Exercise per Session: 0 min  Stress: No Stress Concern Present (01/13/2023)   [place]    Feeling of Stress : Not at all  Social Connections: Moderately Isolated (01/13/2023)   Social Connection and Isolation Panel [NHANES]    Frequency of Communication with Friends and Family: Three times a week    Frequency of Social Gatherings with Friends and Family: Once a week    Attends Religious Services: Never    Marine scientist or Organizations: No    Attends Music therapist: Never    Marital Status: Married    Tobacco Counseling Ready to quit: Not Answered Counseling given: Not Answered Tobacco comments: quit over 45 years ago   Clinical Intake:  Pre-visit preparation completed: Yes  Pain : No/denies pain        How often do you need to have someone help you when you read instructions, pamphlets, or other written materials from your doctor or pharmacy?: 1 - Never  Diabetic?no  Interpreter Needed?: No  Information entered by :: B.Jaiyah Beining,LPN   Activities of Daily Living    01/13/2023    1:21 PM  In your present state of health, do you have any difficulty performing the following activities:  Hearing? 1  Vision? 1  Difficulty concentrating or making decisions? 1  Walking or climbing stairs? 1  Dressing or bathing? 0  Doing errands, shopping? 0  Preparing Food and eating ? N  Using the Toilet? N  In the past six months, have you accidently leaked urine? Y  Do you have problems with loss of bowel control? N  Managing your Medications? N  Managing your Finances? N  Housekeeping or managing your Housekeeping? N    Patient Care Team: Birdie Sons, MD as PCP - General (Family  Medicine) Laneta Simmers as Physician Assistant (  Urology) Isaias Cowman, MD as Consulting Physician (Cardiology) Ree Edman, MD (Dermatology) Pa, [place], Elisha Headland, MD as Consulting Physician (Hematology and Oncology)  Indicate any recent Medical Services you may have received from other than [facility] providers in the past year (date may be approximate).     Assessment:   This is a routine wellness examination for [place].  Hearing/Vision screen Hearing Screening - Comments:: Adequate hearing w/hearing aides Vision Screening - Comments:: Adequate vision w/glasses Dr [place]  Dietary issues and exercise activities discussed: Current Exercise Habits: The patient does not participate in regular exercise at present, Exercise limited by: cardiac condition(s);neurologic condition(s)   Goals Addressed             This Visit's Progress    DIET - EAT MORE FRUITS AND VEGETABLES   On track    DIET - INCREASE WATER INTAKE   On track    Recommend increasing water intake to 4-6 glasses a day.      Exercise 3x per week (30 min per time)   Not on track    Recommend increasing exercise. Pt to work up to walking 3 days a week for 30 minutes.        Depression Screen    01/13/2023    1:18 PM 01/12/2022    1:49 PM 11/21/2021   11:01 AM 03/17/2021    9:44 AM 05/14/2020    2:43 PM 05/09/2019    1:46 PM 05/09/2019    1:45 PM  PHQ 2/9 Scores  PHQ - 2 Score 0 0 0 0 0 0 0  PHQ- 9 Score  '4 5 1  5     '$ Fall Risk    01/13/2023    1:13 PM 01/12/2022    1:52 PM 11/21/2021   11:00 AM 03/17/2021    9:44 AM 05/14/2020    2:46 PM  Force in the past year? '1 1 1 '$ 0 0  Number falls in past yr: 1 1 0 0 0  Injury with Fall? 0 0 0 0 0  Risk for fall due to : No Fall Risks History of fall(s) No Fall Risks No Fall Risks   Follow up Falls prevention discussed;Education provided Falls prevention discussed Falls evaluation completed Falls  evaluation completed     [place]:  Any stairs in or around the home? No  If so, are there any without handrails? No  Home free of loose throw rugs in walkways, pet beds, electrical cords, etc? Yes  Adequate lighting in your home to reduce risk of falls? Yes   ASSISTIVE DEVICES UTILIZED TO PREVENT FALLS:  Life alert? No  Use of a cane, walker or w/c? Yes walker Grab bars in the bathroom? Yes  Shower chair or bench in shower? Yes  Elevated toilet seat or a handicapped toilet? Yes    Cognitive Function:        01/13/2023    1:23 PM 05/02/2018    1:35 PM 04/20/2017   10:44 AM  6CIT Screen  What Year? 0 points 0 points 0 points  What month? 0 points 0 points 0 points  What time? 0 points 0 points 0 points  Count back from 20 0 points 0 points 0 points  Months in reverse 0 points 2 points 2 points  Repeat phrase 0 points 4 points 0 points  Total Score 0 points 6 points 2 points    Immunizations Immunization History  Administered Date(s) Administered   Fluad Quad(high Dose 65+) 08/19/2020, 08/29/2021   Hep A / Hep B 08/24/2019   Hepatitis A, Adult 08/19/2020   Hepb-cpg 08/19/2020   Influenza, High Dose Seasonal PF 08/09/2015, 07/23/2016, 08/26/2018, 10/02/2019   Influenza-Unspecified 10/29/2004, 08/25/2006, 10/19/2007, 07/31/2010, 08/16/2014, 08/25/2017, 08/25/2021   PFIZER(Purple Top)SARS-COV-2 Vaccination 11/17/2019, 12/01/2019, 12/22/2019   Pneumococcal Conjugate-13 12/15/2013, 08/17/2015   Pneumococcal Polysaccharide-23 11/16/2006, 10/19/2007   Td 05/29/2016   Tdap 11/17/2011    TDAP status: Up to date  Flu Vaccine status: Up to date  Pneumococcal vaccine status: Up to date  Covid-19 vaccine status: Completed vaccines  Qualifies for Shingles Vaccine? Yes   Zostavax completed No   Shingrix Completed?: No.    Education has been provided regarding the importance of this vaccine. Patient has been advised to call insurance company  to determine out of pocket expense if they have not yet received this vaccine. Advised may also receive vaccine at local pharmacy or Health Dept. Verbalized acceptance and understanding.  Screening Tests Health Maintenance  Topic Date Due   Zoster Vaccines- Shingrix (1 of 2) Never done   Diabetic kidney evaluation - Urine ACR  04/27/2018   OPHTHALMOLOGY EXAM  06/25/2020   HEMOGLOBIN A1C  01/13/2022   INFLUENZA VACCINE  06/16/2022   COVID-19 Vaccine (5 - 2023-24 season) 07/17/2022   Diabetic kidney evaluation - eGFR measurement  03/31/2023   COLONOSCOPY (Pts 45-74yr Insurance coverage will need to be confirmed)  05/24/2023   Medicare Annual Wellness (AWV)  01/14/2024   DTaP/Tdap/Td (3 - Td or Tdap) 05/29/2026   Pneumonia Vaccine 82 Years old  Completed   HPV VACCINES  Aged Out    Health Maintenance  Health Maintenance Due  Topic Date Due   Zoster Vaccines- Shingrix (1 of 2) Never done   Diabetic kidney evaluation - Urine ACR  04/27/2018   OPHTHALMOLOGY EXAM  06/25/2020   HEMOGLOBIN A1C  01/13/2022   INFLUENZA VACCINE  06/16/2022   COVID-19 Vaccine (5 - 2023-24 season) 07/17/2022    Colorectal cancer screening: No longer required.   Lung Cancer Screening: (Low Dose CT Chest recommended if Age 83-80years, 30 pack-year currently smoking OR have quit w/in 15years.) does not qualify.   Lung Cancer Screening Referral: no  Additional Screening:  Hepatitis C Screening: does not qualify; Completed no  Vision Screening: Recommended annual ophthalmology exams for early detection of glaucoma and other disorders of the eye. Is the patient up to date with their annual eye exam?  Yes  Who is the provider or what is the name of the office in which the patient attends annual eye exams? Dr PCarolan ClinesIf pt is not established with a provider, would they like to be referred to a provider to establish care? No .   Dental Screening: Recommended annual dental exams for proper oral  hygiene  Community Resource Referral / Chronic Care Management: CRR required this visit?  No   CCM required this visit?  No      Plan:     I have personally reviewed and noted the following in the patient's chart:   Medical and social history Use of alcohol, tobacco or illicit drugs  Current medications and supplements including opioid prescriptions. Patient is currently taking opioid prescriptions. Information provided to patient regarding non-opioid alternatives. Patient advised to discuss non-opioid treatment plan with their provider. Tramadol Functional ability and status Nutritional status Physical activity Advanced directives List of other physicians Hospitalizations, surgeries, and ER visits in previous 12  months Vitals Screenings to include cognitive, depression, and falls Referrals and appointments  In addition, I have reviewed and discussed with patient certain preventive protocols, quality metrics, and best practice recommendations. A written personalized care plan for preventive services as well as general preventive health recommendations were provided to patient.     Roger Shelter, LPN   [id number]   Nurse Notes: pt states he is functioning at his baseline. Ambulates with a walker at home and relays he has had 3 or 4 falls; no injuries. Pt does relay a need for medication refills: Flexeril and Zolpidem as he has a few tablets left.

## 2023-01-13 NOTE — Patient Instructions (Signed)
Ian Sanders , Thank you for taking time to come for your Medicare Wellness Visit. I appreciate your ongoing commitment to your health goals. Please review the following plan we discussed and let me know if I can assist you in the future.   These are the goals we discussed:  Goals      DIET - EAT MORE FRUITS AND VEGETABLES     DIET - INCREASE WATER INTAKE     Recommend increasing water intake to 4-6 glasses a day.      Exercise 3x per week (30 min per time)     Recommend increasing exercise. Pt to work up to walking 3 days a week for 30 minutes.         This is a list of the screening recommended for you and due dates:  Health Maintenance  Topic Date Due   Zoster (Shingles) Vaccine (1 of 2) Never done   Yearly kidney health urinalysis for diabetes  04/27/2018   Eye exam for diabetics  06/25/2020   Hemoglobin A1C  01/13/2022   Flu Shot  06/16/2022   COVID-19 Vaccine (5 - 2023-24 season) 07/17/2022   Yearly kidney function blood test for diabetes  03/31/2023   Colon Cancer Screening  05/24/2023   Medicare Annual Wellness Visit  01/14/2024   DTaP/Tdap/Td vaccine (3 - Td or Tdap) 05/29/2026   Pneumonia Vaccine  Completed   HPV Vaccine  Aged Out    Advanced directives: yes  Conditions/risks identified: high falls risk  Next appointment: Follow up in one year for your annual wellness visit. 01/18/2024 '@1'$ :30pm telephone  Preventive Care 65 Years and Older, Male  Preventive care refers to lifestyle choices and visits with your health care provider that can promote health and wellness. What does preventive care include? A yearly physical exam. This is also called an annual well check. Dental exams once or twice a year. Routine eye exams. Ask your health care provider how often you should have your eyes checked. Personal lifestyle choices, including: Daily care of your teeth and gums. Regular physical activity. Eating a healthy diet. Avoiding tobacco and drug use. Limiting  alcohol use. Practicing safe sex. Taking low doses of aspirin every day. Taking vitamin and mineral supplements as recommended by your health care provider. What happens during an annual well check? The services and screenings done by your health care provider during your annual well check will depend on your age, overall health, lifestyle risk factors, and family history of disease. Counseling  Your health care provider may ask you questions about your: Alcohol use. Tobacco use. Drug use. Emotional well-being. Home and relationship well-being. Sexual activity. Eating habits. History of falls. Memory and ability to understand (cognition). Work and work Statistician. Screening  You may have the following tests or measurements: Height, weight, and BMI. Blood pressure. Lipid and cholesterol levels. These may be checked every 5 years, or more frequently if you are over 25 years old. Skin check. Lung cancer screening. You may have this screening every year starting at age 59 if you have a 30-pack-year history of smoking and currently smoke or have quit within the past 15 years. Fecal occult blood test (FOBT) of the stool. You may have this test every year starting at age 63. Flexible sigmoidoscopy or colonoscopy. You may have a sigmoidoscopy every 5 years or a colonoscopy every 10 years starting at age 2. Prostate cancer screening. Recommendations will vary depending on your family history and other risks. Hepatitis C blood test.  Hepatitis B blood test. Sexually transmitted disease (STD) testing. Diabetes screening. This is done by checking your blood sugar (glucose) after you have not eaten for a while (fasting). You may have this done every 1-3 years. Abdominal aortic aneurysm (AAA) screening. You may need this if you are a current or former smoker. Osteoporosis. You may be screened starting at age 28 if you are at high risk. Talk with your health care provider about your test results,  treatment options, and if necessary, the need for more tests. Vaccines  Your health care provider may recommend certain vaccines, such as: Influenza vaccine. This is recommended every year. Tetanus, diphtheria, and acellular pertussis (Tdap, Td) vaccine. You may need a Td booster every 10 years. Zoster vaccine. You may need this after age 63. Pneumococcal 13-valent conjugate (PCV13) vaccine. One dose is recommended after age 28. Pneumococcal polysaccharide (PPSV23) vaccine. One dose is recommended after age 64. Talk to your health care provider about which screenings and vaccines you need and how often you need them. This information is not intended to replace advice given to you by your health care provider. Make sure you discuss any questions you have with your health care provider. Document Released: 11/29/2015 Document Revised: 07/22/2016 Document Reviewed: 09/03/2015 Elsevier Interactive Patient Education  2017 [place] Prevention in the Home Falls can cause injuries. They can happen to people of all ages. There are many things you can do to make your home safe and to help prevent falls. What can I do on the outside of my home? Regularly fix the edges of walkways and driveways and fix any cracks. Remove anything that might make you trip as you walk through a door, such as a raised step or threshold. Trim any bushes or trees on the path to your home. Use bright outdoor lighting. Clear any walking paths of anything that might make someone trip, such as rocks or tools. Regularly check to see if handrails are loose or broken. Make sure that both sides of any steps have handrails. Any raised decks and porches should have guardrails on the edges. Have any leaves, snow, or ice cleared regularly. Use sand or salt on walking paths during winter. Clean up any spills in your garage right away. This includes oil or grease spills. What can I do in the bathroom? Use night  lights. Install grab bars by the toilet and in the tub and shower. Do not use towel bars as grab bars. Use non-skid mats or decals in the tub or shower. If you need to sit down in the shower, use a plastic, non-slip stool. Keep the floor dry. Clean up any water that spills on the floor as soon as it happens. Remove soap buildup in the tub or shower regularly. Attach bath mats securely with double-sided non-slip rug tape. Do not have throw rugs and other things on the floor that can make you trip. What can I do in the bedroom? Use night lights. Make sure that you have a light by your bed that is easy to reach. Do not use any sheets or blankets that are too big for your bed. They should not hang down onto the floor. Have a firm chair that has side arms. You can use this for support while you get dressed. Do not have throw rugs and other things on the floor that can make you trip. What can I do in the kitchen? Clean up any spills right away. Avoid walking on wet floors.  Keep items that you use a lot in easy-to-reach places. If you need to reach something above you, use a strong step stool that has a grab bar. Keep electrical cords out of the way. Do not use floor polish or wax that makes floors slippery. If you must use wax, use non-skid floor wax. Do not have throw rugs and other things on the floor that can make you trip. What can I do with my stairs? Do not leave any items on the stairs. Make sure that there are handrails on both sides of the stairs and use them. Fix handrails that are broken or loose. Make sure that handrails are as long as the stairways. Check any carpeting to make sure that it is firmly attached to the stairs. Fix any carpet that is loose or worn. Avoid having throw rugs at the top or bottom of the stairs. If you do have throw rugs, attach them to the floor with carpet tape. Make sure that you have a light switch at the top of the stairs and the bottom of the stairs. If  you do not have them, ask someone to add them for you. What else can I do to help prevent falls? Wear shoes that: Do not have high heels. Have rubber bottoms. Are comfortable and fit you well. Are closed at the toe. Do not wear sandals. If you use a stepladder: Make sure that it is fully opened. Do not climb a closed stepladder. Make sure that both sides of the stepladder are locked into place. Ask someone to hold it for you, if possible. Clearly mark and make sure that you can see: Any grab bars or handrails. First and last steps. Where the edge of each step is. Use tools that help you move around (mobility aids) if they are needed. These include: Canes. Walkers. Scooters. Crutches. Turn on the lights when you go into a dark area. Replace any light bulbs as soon as they burn out. Set up your furniture so you have a clear path. Avoid moving your furniture around. If any of your floors are uneven, fix them. If there are any pets around you, be aware of where they are. Review your medicines with your doctor. Some medicines can make you feel dizzy. This can increase your chance of falling. Ask your doctor what other things that you can do to help prevent falls. This information is not intended to replace advice given to you by your health care provider. Make sure you discuss any questions you have with your health care provider. Document Released: 08/29/2009 Document Revised: 04/09/2016 Document Reviewed: 12/07/2014 Elsevier Interactive Patient Education  2017 [employer].

## 2023-01-14 ENCOUNTER — Telehealth: Payer: Self-pay | Admitting: Family Medicine

## 2023-01-14 DIAGNOSIS — D485 Neoplasm of uncertain behavior of skin: Secondary | ICD-10-CM | POA: Diagnosis not present

## 2023-01-14 DIAGNOSIS — L570 Actinic keratosis: Secondary | ICD-10-CM | POA: Diagnosis not present

## 2023-01-14 DIAGNOSIS — Z85828 Personal history of other malignant neoplasm of skin: Secondary | ICD-10-CM | POA: Diagnosis not present

## 2023-01-14 DIAGNOSIS — Z08 Encounter for follow-up examination after completed treatment for malignant neoplasm: Secondary | ICD-10-CM | POA: Diagnosis not present

## 2023-01-14 DIAGNOSIS — L578 Other skin changes due to chronic exposure to nonionizing radiation: Secondary | ICD-10-CM | POA: Diagnosis not present

## 2023-01-14 DIAGNOSIS — C44519 Basal cell carcinoma of skin of other part of trunk: Secondary | ICD-10-CM | POA: Diagnosis not present

## 2023-01-14 LAB — PSA: Prostate Specific Ag, Serum: <0.1 ng/mL (ref 0.0–4.0)

## 2023-01-14 NOTE — Telephone Encounter (Signed)
LMOM informed result.

## 2023-01-14 NOTE — Telephone Encounter (Signed)
-----   Message from Abbie Sons, MD sent at 01/14/2023  7:18 AM EST ----- PSA remains detectable at <0.1.  Keep August 2024 follow-up as scheduled

## 2023-01-15 ENCOUNTER — Other Ambulatory Visit: Payer: Self-pay | Admitting: Family Medicine

## 2023-01-15 DIAGNOSIS — M545 Low back pain, unspecified: Secondary | ICD-10-CM

## 2023-01-15 NOTE — Telephone Encounter (Signed)
Requested Prescriptions  Pending Prescriptions Disp Refills   gabapentin (NEURONTIN) 100 MG capsule [Pharmacy Med Name: GABAPENTIN 100 MG CAPSULE] 270 capsule 0    Sig: Take 1 capsule (100 mg total) by mouth 3 (three) times daily.     Neurology: Anticonvulsants - gabapentin Passed - 01/15/2023  5:34 PM      Passed - Cr in normal range and within 360 days    Creatinine  Date Value Ref Range Status  08/18/2012 1.36 (H) 0.60 - 1.30 mg/dL Final   Creatinine, Ser  Date Value Ref Range Status  03/30/2022 0.75 0.61 - 1.24 mg/dL Final   Creatinine, POC  Date Value Ref Range Status  04/27/2017 n/a mg/dL Final         Passed - Completed PHQ-2 or PHQ-9 in the last 360 days      Passed - Valid encounter within last 12 months    Recent Outpatient Visits           2 months ago Right groin pain   [place], MD   3 months ago Insomnia, unspecified type   [place], [place], PA-C   1 year ago Primary hypertension   [place], Donald E, MD   1 year ago 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

## 2023-01-18 ENCOUNTER — Other Ambulatory Visit: Payer: Self-pay | Admitting: Physician Assistant

## 2023-01-18 DIAGNOSIS — G4700 Insomnia, unspecified: Secondary | ICD-10-CM

## 2023-01-19 NOTE — Telephone Encounter (Signed)
Requested medications are due for refill today.  Unsure  Requested medications are on the active medications list.  yes  Last refill. 12/24/2022 #30 0 rf  Future visit scheduled.   yes  Notes to clinic.  Refill not delegated. [place] listed as PCP.    Requested Prescriptions  Pending Prescriptions Disp Refills   zolpidem (AMBIEN) 10 MG tablet [Pharmacy Med Name: ZOLPIDEM TARTRATE 10 MG TABLET] 30 tablet 0    Sig: TAKE ONE-HALF TO ONE TABLET AT BEDTIME.     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, [place]

## 2023-01-29 ENCOUNTER — Other Ambulatory Visit: Payer: Self-pay | Admitting: Family Medicine

## 2023-01-29 DIAGNOSIS — I10 Essential (primary) hypertension: Secondary | ICD-10-CM

## 2023-02-01 ENCOUNTER — Other Ambulatory Visit: Payer: Self-pay | Admitting: Family Medicine

## 2023-02-01 MED ORDER — TROSPIUM CHLORIDE 20 MG PO TABS: 20.0000 mg | ORAL_TABLET | Freq: Two times a day (BID) | ORAL | 1 refills | Status: DC

## 2023-02-04 DIAGNOSIS — H353211 Exudative age-related macular degeneration, right eye, with active choroidal neovascularization: Secondary | ICD-10-CM | POA: Diagnosis not present

## 2023-02-10 DIAGNOSIS — G20C Parkinsonism, unspecified: Secondary | ICD-10-CM | POA: Diagnosis not present

## 2023-02-10 DIAGNOSIS — G20A1 Parkinson's disease without dyskinesia, without mention of fluctuations: Secondary | ICD-10-CM | POA: Diagnosis not present

## 2023-02-10 DIAGNOSIS — I480 Paroxysmal atrial fibrillation: Secondary | ICD-10-CM | POA: Diagnosis not present

## 2023-02-10 DIAGNOSIS — Z8679 Personal history of other diseases of the circulatory system: Secondary | ICD-10-CM | POA: Diagnosis not present

## 2023-02-10 DIAGNOSIS — G3184 Mild cognitive impairment, so stated: Secondary | ICD-10-CM | POA: Diagnosis not present

## 2023-02-16 ENCOUNTER — Other Ambulatory Visit: Payer: Self-pay | Admitting: Physician Assistant

## 2023-02-16 DIAGNOSIS — G4700 Insomnia, unspecified: Secondary | ICD-10-CM

## 2023-02-16 NOTE — Telephone Encounter (Signed)
Requested medication (s) are due for refill today: yes  Requested medication (s) are on the active medication list: yes  Last refill:  01/19/23 #30 0 refills  Future visit scheduled: yes in 2 months   Notes to clinic:  not delegated per protocol. Do you want to refill Rx?     Requested Prescriptions  Pending Prescriptions Disp Refills   zolpidem (AMBIEN) 10 MG tablet [Pharmacy Med Name: ZOLPIDEM TARTRATE 10 MG TABLET] 30 tablet 0    Sig: TAKE ONE-HALF TO ONE TABLET AT BEDTIME.     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

## 2023-02-24 DIAGNOSIS — C44519 Basal cell carcinoma of skin of other part of trunk: Secondary | ICD-10-CM | POA: Diagnosis not present

## 2023-02-24 DIAGNOSIS — L570 Actinic keratosis: Secondary | ICD-10-CM | POA: Diagnosis not present

## 2023-03-11 DIAGNOSIS — H353211 Exudative age-related macular degeneration, right eye, with active choroidal neovascularization: Secondary | ICD-10-CM | POA: Diagnosis not present

## 2023-03-12 DIAGNOSIS — C44619 Basal cell carcinoma of skin of left upper limb, including shoulder: Secondary | ICD-10-CM | POA: Diagnosis not present

## 2023-03-12 DIAGNOSIS — D2261 Melanocytic nevi of right upper limb, including shoulder: Secondary | ICD-10-CM | POA: Diagnosis not present

## 2023-03-12 DIAGNOSIS — D2262 Melanocytic nevi of left upper limb, including shoulder: Secondary | ICD-10-CM | POA: Diagnosis not present

## 2023-03-12 DIAGNOSIS — D485 Neoplasm of uncertain behavior of skin: Secondary | ICD-10-CM | POA: Diagnosis not present

## 2023-03-12 DIAGNOSIS — Z85828 Personal history of other malignant neoplasm of skin: Secondary | ICD-10-CM | POA: Diagnosis not present

## 2023-03-12 DIAGNOSIS — D225 Melanocytic nevi of trunk: Secondary | ICD-10-CM | POA: Diagnosis not present

## 2023-04-05 ENCOUNTER — Other Ambulatory Visit: Payer: Self-pay | Admitting: Urology

## 2023-04-05 ENCOUNTER — Other Ambulatory Visit: Payer: Self-pay | Admitting: Family Medicine

## 2023-04-07 ENCOUNTER — Other Ambulatory Visit: Payer: Self-pay | Admitting: *Deleted

## 2023-04-07 DIAGNOSIS — D61818 Other pancytopenia: Secondary | ICD-10-CM

## 2023-04-07 DIAGNOSIS — K7460 Unspecified cirrhosis of liver: Secondary | ICD-10-CM

## 2023-04-09 ENCOUNTER — Inpatient Hospital Stay: Payer: PPO

## 2023-04-09 ENCOUNTER — Encounter: Payer: Self-pay | Admitting: Internal Medicine

## 2023-04-09 ENCOUNTER — Other Ambulatory Visit: Payer: Self-pay | Admitting: Family Medicine

## 2023-04-09 ENCOUNTER — Inpatient Hospital Stay: Payer: PPO | Attending: Internal Medicine | Admitting: Internal Medicine

## 2023-04-09 VITALS — BP 134/49 | HR 59 | Temp 96.9°F | Ht 70.0 in | Wt 160.8 lb

## 2023-04-09 DIAGNOSIS — Z79899 Other long term (current) drug therapy: Secondary | ICD-10-CM | POA: Insufficient documentation

## 2023-04-09 DIAGNOSIS — M549 Dorsalgia, unspecified: Secondary | ICD-10-CM | POA: Insufficient documentation

## 2023-04-09 DIAGNOSIS — R161 Splenomegaly, not elsewhere classified: Secondary | ICD-10-CM | POA: Diagnosis not present

## 2023-04-09 DIAGNOSIS — Z85828 Personal history of other malignant neoplasm of skin: Secondary | ICD-10-CM | POA: Diagnosis not present

## 2023-04-09 DIAGNOSIS — C61 Malignant neoplasm of prostate: Secondary | ICD-10-CM | POA: Diagnosis not present

## 2023-04-09 DIAGNOSIS — D509 Iron deficiency anemia, unspecified: Secondary | ICD-10-CM | POA: Diagnosis not present

## 2023-04-09 DIAGNOSIS — D696 Thrombocytopenia, unspecified: Secondary | ICD-10-CM | POA: Diagnosis not present

## 2023-04-09 DIAGNOSIS — Z8051 Family history of malignant neoplasm of kidney: Secondary | ICD-10-CM | POA: Insufficient documentation

## 2023-04-09 DIAGNOSIS — I4891 Unspecified atrial fibrillation: Secondary | ICD-10-CM | POA: Insufficient documentation

## 2023-04-09 DIAGNOSIS — Z87891 Personal history of nicotine dependence: Secondary | ICD-10-CM | POA: Diagnosis not present

## 2023-04-09 DIAGNOSIS — Z8673 Personal history of transient ischemic attack (TIA), and cerebral infarction without residual deficits: Secondary | ICD-10-CM | POA: Diagnosis not present

## 2023-04-09 DIAGNOSIS — K7460 Unspecified cirrhosis of liver: Secondary | ICD-10-CM | POA: Diagnosis not present

## 2023-04-09 DIAGNOSIS — E039 Hypothyroidism, unspecified: Secondary | ICD-10-CM | POA: Diagnosis not present

## 2023-04-09 DIAGNOSIS — Z806 Family history of leukemia: Secondary | ICD-10-CM | POA: Diagnosis not present

## 2023-04-09 DIAGNOSIS — G4700 Insomnia, unspecified: Secondary | ICD-10-CM

## 2023-04-09 DIAGNOSIS — I10 Essential (primary) hypertension: Secondary | ICD-10-CM | POA: Insufficient documentation

## 2023-04-09 DIAGNOSIS — M2550 Pain in unspecified joint: Secondary | ICD-10-CM | POA: Insufficient documentation

## 2023-04-09 DIAGNOSIS — D61818 Other pancytopenia: Secondary | ICD-10-CM

## 2023-04-09 LAB — CBC WITH DIFFERENTIAL (CANCER CENTER ONLY)
Abs Immature Granulocytes: 0 10*3/uL (ref 0.00–0.07)
Basophils Absolute: 0 10*3/uL (ref 0.0–0.1)
Basophils Relative: 0 %
Eosinophils Absolute: 0.1 10*3/uL (ref 0.0–0.5)
Eosinophils Relative: 4 %
HCT: 36.2 % — ABNORMAL LOW (ref 39.0–52.0)
Hemoglobin: 11.9 g/dL — ABNORMAL LOW (ref 13.0–17.0)
Immature Granulocytes: 0 %
Lymphocytes Relative: 17 %
Lymphs Abs: 0.5 10*3/uL — ABNORMAL LOW (ref 0.7–4.0)
MCH: 26.2 pg (ref 26.0–34.0)
MCHC: 32.9 g/dL (ref 30.0–36.0)
MCV: 79.6 fL — ABNORMAL LOW (ref 80.0–100.0)
Monocytes Absolute: 0.2 10*3/uL (ref 0.1–1.0)
Monocytes Relative: 6 %
Neutro Abs: 2 10*3/uL (ref 1.7–7.7)
Neutrophils Relative %: 73 %
Platelet Count: 56 10*3/uL — ABNORMAL LOW (ref 150–400)
RBC: 4.55 MIL/uL (ref 4.22–5.81)
RDW: 13.2 % (ref 11.5–15.5)
WBC Count: 2.8 10*3/uL — ABNORMAL LOW (ref 4.0–10.5)
nRBC: 0 % (ref 0.0–0.2)

## 2023-04-09 LAB — FERRITIN: Ferritin: 60 ng/mL (ref 24–336)

## 2023-04-09 LAB — CMP (CANCER CENTER ONLY)
ALT: 14 U/L (ref 0–44)
AST: 25 U/L (ref 15–41)
Albumin: 3.9 g/dL (ref 3.5–5.0)
Alkaline Phosphatase: 77 U/L (ref 38–126)
Anion gap: 10 (ref 5–15)
BUN: 12 mg/dL (ref 8–23)
CO2: 27 mmol/L (ref 22–32)
Calcium: 9 mg/dL (ref 8.9–10.3)
Chloride: 96 mmol/L — ABNORMAL LOW (ref 98–111)
Creatinine: 0.79 mg/dL (ref 0.61–1.24)
GFR, Estimated: >60 mL/min (ref >60)
Glucose, Bld: 135 mg/dL — ABNORMAL HIGH (ref 70–99)
Potassium: 3.8 mmol/L (ref 3.5–5.1)
Sodium: 133 mmol/L — ABNORMAL LOW (ref 135–145)
Total Bilirubin: 1.1 mg/dL (ref 0.3–1.2)
Total Protein: 7 g/dL (ref 6.5–8.1)

## 2023-04-09 LAB — IRON AND TIBC
Iron: 109 ug/dL (ref 45–182)
Saturation Ratios: 31 % (ref 17.9–39.5)
TIBC: 351 ug/dL (ref 250–450)
UIBC: 242 ug/dL

## 2023-04-09 NOTE — Progress Notes (Signed)
No concerns today 

## 2023-04-09 NOTE — Assessment & Plan Note (Addendum)
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  November 2021- bone marrow- NEG/reactive.  Continue surveillance.  Overall stable.  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 iron every other day.  Hold off iron infusions.  #A. fib on on aspirin; Xarelto discontinued [recent fall intracranial bleed]-  stable.   #Cirrhosis child Pugh a [CT scan April 2022-NEG- no evidence of any decompensation; discussed re: HCC screening-  NOV 2023-liver morphology suggestive of cirrhosis.  No focal lesions noted.  aFP pending today. Declines 6 months ultrasound [fall risk]; continue ultrasound on a yearly basis -will order NOV 2024.   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  2021 PSA less than 0.1. stable. DISPOSITION: # print copy of labs-  # follow up in 6 months- MD; labs-cbc/cmp;PTT; PTT; AFP; iron studies/ferritin;US  abdomen - Dr.B  Cc: Dr.Fisher-

## 2023-04-09 NOTE — Progress Notes (Signed)
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 Tobi Bastos   Prostate cancer [facility]) (Resolved)  06/20/2015 Initial Diagnosis   Prostate cancer (HCC)   Prostate cancer (HCC)  09/12/2020 Initial Diagnosis   Prostate cancer (HCC)    HISTORY OF PRESENTING ILLNESS: Alone.  Walks with a walker.   Ian Sanders 82 y.o.  male pleasant patient with history of alcohol use /cirrhosis /A. Fib-not on anticoagulation he is here for follow-up.   No bleeding. Patient denies any blood in stools or black-colored stools.  Denies any nausea vomiting.  Denies any swelling in the legs.  No falls.  Continues to have joint pains back pain.   Review of Systems  Constitutional:  Positive for malaise/fatigue. Negative for chills, diaphoresis, fever and weight loss.  HENT:  Negative for nosebleeds and sore throat.   Eyes:  Negative for double vision.  Respiratory:  Negative for cough, hemoptysis, sputum production, shortness of breath and wheezing.   Cardiovascular:  Negative for chest pain, palpitations,  orthopnea and leg swelling.  Gastrointestinal:  Negative for abdominal pain, blood in stool, constipation, diarrhea, heartburn, melena, nausea and vomiting.  Genitourinary:  Negative for dysuria, frequency and urgency.  Musculoskeletal:  Positive for back pain and joint pain.  Skin: Negative.  Negative for itching and rash.  Neurological:  Negative for dizziness, tingling, focal weakness, weakness and headaches.  Endo/Heme/Allergies:  Does not bruise/bleed easily.  Psychiatric/Behavioral:  Negative for depression. The patient is not nervous/anxious and does not have insomnia.      MEDICAL HISTORY:  Past Medical History:  Diagnosis Date   Atrial fibrillation (HCC)    Basal cell carcinoma    Benign paroxysmal positional vertigo    Cancer (HCC)    History of chicken pox    History of measles    History of mumps    Hypertension    Mononeuritis of unspecified site    Other seborrheic keratosis    Stroke (HCC) 06/2020   Transient ischemic attack (TIA), and cerebral infarction without residual deficits(V12.54)    Traumatic subdural hematoma without loss of consciousness (HCC) 06/25/2020   Unspecified hypothyroidism     SURGICAL HISTORY: Past Surgical History:  Procedure Laterality Date   Carotid Doppler Ultrasound  04/02/2010   Small amount Calcified plaque bilaterally, no significant stenosis.   CATARACT EXTRACTION W/PHACO Right 04/15/2021   Procedure: CATARACT EXTRACTION PHACO AND INTRAOCULAR LENS PLACEMENT (IOC) RIGHT;  Surgeon: Galen Manila, MD;  Location[facility];  Service: Ophthalmology;  Laterality: Right;  7.98 0:47.2   CATARACT EXTRACTION W/PHACO Left 05/20/2021   Procedure: CATARACT EXTRACTION PHACO AND INTRAOCULAR LENS PLACEMENT (IOC) LEFT 3.37 00:32.0;  Surgeon: Galen Manila, MD;  Location: [facility];  Service: Ophthalmology;  Laterality: Left;  Requests arrival 10A or after  COLONOSCOPY  05/11/2014   Tubular Adenoma. Dr. Servando Snare    COLONOSCOPY WITH PROPOFOL N/A 05/23/2018   Procedure: COLONOSCOPY WITH PROPOFOL;  Surgeon: Wyline Mood, MD;  Location: [facility];  Service: Gastroenterology;  Laterality: N/A;   DOPPLER ECHOCARDIOGRAPHY  04/02/2010   Mild left atrial dilation. Normal right atrium. No valvular disease. No thrombus. Normal LV function. EF>55%   ESOPHAGOGASTRODUODENOSCOPY (EGD) WITH PROPOFOL N/A 08/16/2019   Procedure: ESOPHAGOGASTRODUODENOSCOPY (EGD) WITH PROPOFOL;  Surgeon: Wyline Mood, MD;  Location: [facility];  Service: Gastroenterology;  Laterality: N/A;   FINGER AMPUTATION     HERNIA REPAIR Right    MRI Brain with and without contrast  03/20/2010   Suggestive of basal ganglia lacunar infarct   PROSTATECTOMY  11/16/1993   Abdominal   SKIN SURGERY     multiple   Thumb surgery      SOCIAL HISTORY: Social History   Socioeconomic History   Marital status: Married    Spouse name: Not on file   Number of children: 2   Years of education: Not on file   Highest education level: Associate degree: occupational, Scientist, product/process development, or vocational program  Occupational History   Occupation: Retired  Tobacco Use   Smoking status: Former    Packs/day: 0.00    Years: 12.00    Additional pack years: 0.00    Total pack years: 0.00    Types: Cigarettes   Smokeless tobacco: Current    Types: Chew   Tobacco comments:    quit over 45 years ago  Vaping Use   Vaping Use: Never used  Substance and Sexual Activity   Alcohol use: Not Currently    Alcohol/week: 5.0 standard drinks of alcohol    Types: 5 Cans of beer per week    Comment: quit drinking summer 2021   Drug use: No   Sexual activity: Not on file  Other Topics Concern   Not on file  Social History Narrative   Lives with wife at home ; alcohol abuse; quit smoking 2016; 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   Social Determinants of Health   Financial Resource Strain: Low Risk  (01/13/2023)   Overall Financial Resource Strain  (CARDIA)    Difficulty of Paying Living Expenses: Not hard at all  Food Insecurity: No Food Insecurity (01/13/2023)   Hunger Vital Sign    Worried About Running Out of Food in the Last Year: Never true    Ran Out of Food in the Last Year: Never true  Transportation Needs: No Transportation Needs (01/13/2023)   PRAPARE - Administrator, Civil Service (Medical): No    Lack of Transportation (Non-Medical): No  Physical Activity: Inactive (01/13/2023)   Exercise Vital Sign    Days of Exercise per Week: 0 days    Minutes of Exercise per Session: 0 min  Stress: No Stress Concern Present (01/13/2023)   [employer] of Occupational Health - Occupational Stress Questionnaire    Feeling of Stress : Not at all  Social Connections: Moderately Isolated (01/13/2023)   Social Connection and Isolation Panel [NHANES]    Frequency of Communication with Friends and Family: Three times a week    Frequency of Social Gatherings with Friends and Family: Once a week    Attends Religious Services: Never    Database administrator or Organizations: No    Attends Banker Meetings: Never    Marital Status: Married  Catering manager Violence: Not At Risk (01/13/2023)   Humiliation, Afraid, Rape,  and Kick questionnaire    Fear of Current or Ex-Partner: No    Emotionally Abused: No    Physically Abused: No    Sexually Abused: No    FAMILY HISTORY: Family History  Problem Relation Age of Onset   Heart disease Mother 78       CABG   Hypertension Mother    Heart attack Mother    Hypertension Brother    Heart disease Sister        stents   Prostate cancer Brother    Kidney cancer Brother    Skin cancer Brother    Hypertension Brother    Bladder Cancer Sister    Leukemia Father    Stroke Father        multiple   Cerebral aneurysm Sister    Kidney cancer Brother     ALLERGIES:  is allergic to lisinopril, myrbetriq [mirabegron], beta adrenergic blockers, other, triamterene,  amlodipine, atorvastatin, dabigatran etexilate mesylate, and dabigatran etexilate mesylate.  MEDICATIONS:  Current Outpatient Medications  Medication Sig Dispense Refill   calcium-vitamin D (OSCAL) 250-125 MG-UNIT per tablet Take 1 tablet by mouth 2 (two) times daily.      chlorthalidone (HYGROTON) 25 MG tablet Take 1 tablet (25 mg total) by mouth daily. Take 25 mg by mouth daily. 90 tablet 1   cyanocobalamin 1000 MCG tablet Take 1 tablet by mouth daily.     cyclobenzaprine (FLEXERIL) 10 MG tablet Take 1 tablet by mouth daily.     diltiazem (CARDIZEM CD) 120 MG 24 hr capsule Take 120 mg by mouth daily.     diltiazem (CARDIZEM) 30 MG tablet Take 1 tablet by mouth daily.     FA-B6-B12-D-Omega 3-Phytoster (ANIMI-3) 1 MG CAPS Take 1 capsule by mouth 1 day or 1 dose.     Ferrous Sulfate 134 MG TABS Take 1 tablet by mouth daily.     gabapentin (NEURONTIN) 100 MG capsule Take 1 capsule (100 mg total) by mouth 3 (three) times daily. 270 capsule 0   Leuprolide Acetate, 6 Month, (LUPRON) 45 MG injection Inject 45 mg into the muscle every 6 (six) months.     losartan (COZAAR) 25 MG tablet Take 1 tablet by mouth daily.     meclizine (ANTIVERT) 25 MG tablet Take 1 tablet by mouth daily.     omeprazole (PRILOSEC) 20 MG capsule Take 1 capsule by mouth daily.     polyvinyl alcohol (LIQUIFILM TEARS) 1.4 % ophthalmic solution 1 drop as needed.     Potassium 99 MG TABS Take 1 tablet by mouth daily.     traMADol (ULTRAM) 50 MG tablet Take 1 tablet (50 mg total) by mouth every 6 (six) hours as needed. 30 tablet 0   trospium (SANCTURA) 20 MG tablet Take 1 tablet (20 mg total) by mouth 2 (two) times daily. 60 tablet 0   zolpidem (AMBIEN) 10 MG tablet TAKE ONE-HALF TO ONE TABLET AT BEDTIME. 30 tablet 1   flecainide (TAMBOCOR) 50 MG tablet Take 1 tablet (50 mg total) by mouth 2 (two) times daily. (Patient not taking: Reported on 01/13/2023) 180 tablet 3   No current facility-administered medications for this visit.      Marland Kitchen  PHYSICAL EXAMINATION:   Vitals:   04/09/23 1441  BP: (!) 134/49  Pulse: (!) 59  Temp: (!) 96.9 F (36.1 C)  SpO2: 99%    Filed Weights   04/09/23 1441  Weight: 160 lb 12.8 oz (72.9 kg)     Physical Exam HENT:  Head: Normocephalic and atraumatic.     Mouth/Throat:     Pharynx: No oropharyngeal exudate.  Eyes:     Pupils: Pupils are equal, round, and reactive to light.  Cardiovascular:     Rate and Rhythm: Normal rate and regular rhythm.  Pulmonary:     Effort: No respiratory distress.     Breath sounds: No wheezing.  Abdominal:     General: Bowel sounds are normal. There is no distension.     Palpations: Abdomen is soft. There is no mass.     Tenderness: There is no abdominal tenderness. There is no guarding or rebound.     Comments: Positive for splenomegaly.  Musculoskeletal:        General: No tenderness. Normal range of motion.     Cervical back: Normal range of motion and neck supple.  Skin:    General: Skin is warm.  Neurological:     Mental Status: He is alert and oriented to person, place, and time.  Psychiatric:        Mood and Affect: Affect normal.      LABORATORY DATA:  I have reviewed the data as listed Lab Results  Component Value Date   WBC 2.8 (L) 04/09/2023   HGB 11.9 (L) 04/09/2023   HCT 36.2 (L) 04/09/2023   MCV 79.6 (L) 04/09/2023   PLT 56 (L) 04/09/2023   Recent Labs    04/09/23 1509  NA 133*  K 3.8  CL 96*  CO2 27  GLUCOSE 135*  BUN 12  CREATININE 0.79  CALCIUM 9.0  GFRNONAA >60  PROT 7.0  ALBUMIN 3.9  AST 25  ALT 14  ALKPHOS 77  BILITOT 1.1      No results found.  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  November 2021- bone marrow- NEG/reactive.  Continue surveillance.  Overall stable.  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 iron every other day.  Hold off  iron infusions.  #A. fib on on aspirin; Xarelto discontinued [recent fall intracranial bleed]-  stable.   #Cirrhosis child Pugh a [CT scan April 2022-NEG- no evidence of any decompensation; discussed re: HCC screening-  NOV 2023-liver morphology suggestive of cirrhosis.  No focal lesions noted.  aFP pending today. Declines 6 months ultrasound [fall risk]; continue ultrasound on a yearly basis -will order NOV 2024.   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  2021 PSA less than 0.1. stable. DISPOSITION: # print copy of labs-  # follow up in 6 months- MD; labs-cbc/cmp;PTT; PTT; AFP; iron studies/ferritin;US  abdomen - Dr.B  Cc: Dr.Fisher-     Earna Coder, MD 04/09/2023 3:57 PM

## 2023-04-09 NOTE — Telephone Encounter (Signed)
Requested medications are due for refill today.  unsure  Requested medications are on the active medications list.  yes  Last refill. 02/17/2023 #30 1 rf  Future visit scheduled.   yes  Notes to clinic.  Cheri Kearns listed as PCP.    Requested Prescriptions  Pending Prescriptions Disp Refills   zolpidem (AMBIEN) 10 MG tablet [Pharmacy Med Name: ZOLPIDEM TARTRATE 10 MG TABLET] 30 tablet 0    Sig: TAKE ONE-HALF TO ONE TABLET AT BEDTIME.     Not Delegated - Psychiatry:  Anxiolytics/Hypnotics Failed - 04/09/2023 12:31 PM      Failed - This refill cannot be delegated      Failed - Urine Drug Screen completed in last 360 days      Passed - Valid encounter within last 6 months    Recent Outpatient Visits           5 months ago Right groin pain   [place] [facility] Malva Limes, MD   6 months ago Insomnia, unspecified type   [facility] [place], [place], PA-C   1 year ago Primary hypertension   [facility] Malva Limes, MD   1 year ago Pancytopenia [facility])   [place] [facility] Malva Limes, MD   2 years ago Prediabetes   [facility] Malva Limes, MD       Future Appointments             In 3 weeks Fisher, Demetrios Isaacs, MD [facility]

## 2023-04-10 LAB — AFP TUMOR MARKER: AFP, Serum, Tumor Marker: 2.1 ng/mL (ref 0.0–6.4)

## 2023-04-19 ENCOUNTER — Other Ambulatory Visit: Payer: Self-pay | Admitting: Family Medicine

## 2023-04-19 DIAGNOSIS — G8929 Other chronic pain: Secondary | ICD-10-CM

## 2023-04-19 DIAGNOSIS — H353211 Exudative age-related macular degeneration, right eye, with active choroidal neovascularization: Secondary | ICD-10-CM | POA: Diagnosis not present

## 2023-04-19 NOTE — Telephone Encounter (Signed)
Requested Prescriptions  Pending Prescriptions Disp Refills   gabapentin (NEURONTIN) 100 MG capsule [Pharmacy Med Name: GABAPENTIN 100 MG CAPSULE] 270 capsule 0    Sig: Take 1 capsule (100 mg total) by mouth 3 (three) times daily.     Neurology: Anticonvulsants - gabapentin Passed - 04/19/2023 11:07 AM      Passed - Cr in normal range and within 360 days    Creatinine  Date Value Ref Range Status  04/09/2023 0.79 0.61 - 1.24 mg/dL Final  16/08/9603 5.40 (H) 0.60 - 1.30 mg/dL Final   Creatinine, POC  Date Value Ref Range Status  04/27/2017 n/a mg/dL Final         Passed - Completed PHQ-2 or PHQ-9 in the last 360 days      Passed - Valid encounter within last 12 months    Recent Outpatient Visits           5 months ago Right groin pain   [place] [facility] Malva Limes, MD   6 months ago Insomnia, unspecified type   [facility] [place], [place], PA-C   1 year ago Primary hypertension   [place] [facility] Malva Limes, MD   1 year ago Pancytopenia [facility])   [place] [facility] Malva Limes, MD   2 years ago Prediabetes   [facility] Malva Limes, MD       Future Appointments             In 2 weeks Fisher, Demetrios Isaacs, MD [facility], [facility]

## 2023-04-27 DIAGNOSIS — E781 Pure hyperglyceridemia: Secondary | ICD-10-CM | POA: Diagnosis not present

## 2023-04-27 DIAGNOSIS — R001 Bradycardia, unspecified: Secondary | ICD-10-CM | POA: Diagnosis not present

## 2023-04-27 DIAGNOSIS — I10 Essential (primary) hypertension: Secondary | ICD-10-CM | POA: Diagnosis not present

## 2023-04-27 DIAGNOSIS — R002 Palpitations: Secondary | ICD-10-CM | POA: Diagnosis not present

## 2023-04-27 DIAGNOSIS — Z8673 Personal history of transient ischemic attack (TIA), and cerebral infarction without residual deficits: Secondary | ICD-10-CM | POA: Diagnosis not present

## 2023-05-03 ENCOUNTER — Ambulatory Visit: Payer: PPO | Admitting: Family Medicine

## 2023-05-03 ENCOUNTER — Encounter: Payer: Self-pay | Admitting: Family Medicine

## 2023-05-03 ENCOUNTER — Telehealth (INDEPENDENT_AMBULATORY_CARE_PROVIDER_SITE_OTHER): Payer: PPO | Admitting: Family Medicine

## 2023-05-03 DIAGNOSIS — I480 Paroxysmal atrial fibrillation: Secondary | ICD-10-CM

## 2023-05-03 DIAGNOSIS — D61818 Other pancytopenia: Secondary | ICD-10-CM | POA: Diagnosis not present

## 2023-05-03 DIAGNOSIS — D500 Iron deficiency anemia secondary to blood loss (chronic): Secondary | ICD-10-CM | POA: Diagnosis not present

## 2023-05-03 DIAGNOSIS — G629 Polyneuropathy, unspecified: Secondary | ICD-10-CM

## 2023-05-03 DIAGNOSIS — I10 Essential (primary) hypertension: Secondary | ICD-10-CM

## 2023-05-03 DIAGNOSIS — R7303 Prediabetes: Secondary | ICD-10-CM | POA: Diagnosis not present

## 2023-05-03 NOTE — Progress Notes (Signed)
MyChart Video Visit    Virtual Visit via Video Note   This format is felt to be most appropriate for this patient at this time. Physical exam was limited by quality of the video and audio technology used for the visit.   Patient location: home Provider location: bfp  I discussed the limitations of evaluation and management by telemedicine and the availability of in person appointments. The patient expressed understanding and agreed to proceed.  Patient: Ian Sanders   DOB: Mar 15, 1941   82 y.o. Male  MRN: [id number] Visit Date: 05/03/2023  Today's healthcare provider: Mila Merry, MD   Chief Complaint  Patient presents with   Follow-up   Subjective    Discussed the use of AI scribe software for clinical note transcription with the patient, who gave verbal consent to proceed.  History of Present Illness   The patient, with a history of hypertension and insomnia, presents for a medication follow-up. He reports adherence to his current regimen of chlorthalidone, diltiazem, losartan, and Ambien. The patient notes that his blood pressure has been stable, with only a slight increase in readings since his last cardiology appointment with Dr. Darrold Junker when it was 130/68. He takes Ambien nightly, with the dosage varying between half to a full tablet depending on his sleep difficulties. He denies any daytime grogginess.  The patient is also on gabapentin three times a day for neuropathy. He reports continued burning and tingling in his feet and toes, but denies any sleep disturbances due to these symptoms. He expresses uncertainty about the effectiveness of the gabapentin, as he has not noticed a significant change in his symptoms.   He has been getting labs checked by oncology where he sees Dr. Donneta Romberg          Medications: Outpatient Medications Prior to Visit  Medication Sig   calcium-vitamin D (OSCAL) 250-125 MG-UNIT per tablet Take 1 tablet by mouth 2 (two) times  daily.    chlorthalidone (HYGROTON) 25 MG tablet Take 1 tablet (25 mg total) by mouth daily. Take 25 mg by mouth daily.   cyanocobalamin 1000 MCG tablet Take 1 tablet by mouth daily.   diltiazem (CARDIZEM CD) 120 MG 24 hr capsule Take 120 mg by mouth daily.   flecainide (TAMBOCOR) 50 MG tablet Take 1 tablet (50 mg total) by mouth 2 (two) times daily.   gabapentin (NEURONTIN) 100 MG capsule Take 1 capsule (100 mg total) by mouth 3 (three) times daily.   Leuprolide Acetate, 6 Month, (LUPRON) 45 MG injection Inject 45 mg into the muscle every 6 (six) months.   losartan (COZAAR) 25 MG tablet Take 1 tablet by mouth daily.   meclizine (ANTIVERT) 25 MG tablet Take 1 tablet by mouth daily.   omeprazole (PRILOSEC) 20 MG capsule Take 1 capsule by mouth daily.   polyvinyl alcohol (LIQUIFILM TEARS) 1.4 % ophthalmic solution 1 drop as needed.   Potassium 99 MG TABS Take 1 tablet by mouth daily.   traMADol (ULTRAM) 50 MG tablet Take 1 tablet (50 mg total) by mouth every 6 (six) hours as needed.   trospium (SANCTURA) 20 MG tablet Take 1 tablet (20 mg total) by mouth 2 (two) times daily.   zolpidem (AMBIEN) 10 MG tablet TAKE ONE-HALF TO ONE TABLET AT BEDTIME.   [DISCONTINUED] diltiazem (CARDIZEM) 30 MG tablet Take 1 tablet by mouth daily.   cyclobenzaprine (FLEXERIL) 10 MG tablet Take 1 tablet by mouth daily. (Patient not taking: Reported on 05/03/2023)   FA-B6-B12-D-Omega 3-Phytoster (  ANIMI-3) 1 MG CAPS Take 1 capsule by mouth 1 day or 1 dose. (Patient not taking: Reported on 05/03/2023)   Ferrous Sulfate 134 MG TABS Take 1 tablet by mouth daily. (Patient not taking: Reported on 05/03/2023)   No facility-administered medications prior to visit.      Objective    There were no vitals taken for this visit.   Physical Exam   Awake, alert, oriented x 3. In no apparent distress   Assessment & Plan     Assessment and Plan    Hypertension: Well controlled on current regimen of Chlorthalidone, Diltiazem,  and Losartan. -Continue current medications.  Insomnia: Taking Ambien nightly, sometimes half a tablet, sometimes a full tablet. No daytime grogginess reported. -Continue Ambien as needed for sleep.  Peripheral Neuropathy: Taking Gabapentin 100mg  three times a day. Reports persistent symptoms but no significant distress. -Consider increasing Gabapentin to 4 times a day as needed. -Check in before next refill if consistently taking increased dose.  Hyperglycemia: Recent blood sugar slightly elevated at 135. -Encouraged to maintain a healthy diet and limit sweets.  Iron Levels: Patient inquired about recent iron levels. Iron and ferritin levels within normal range. -No changes needed.  Follow-up: Continue current medications. Monitor blood sugar levels and consider dietary modifications. Increase Gabapentin as needed for neuropathy symptoms.     Reviewed cardiology GI and oncology records and patient currently up to date on specialists follow up for his chronic conditions.     I discussed the assessment and treatment plan with the patient. The patient was provided an opportunity to ask questions and all were answered. The patient agreed with the plan and demonstrated an understanding of the instructions.   The patient was advised to call back or seek an in-person evaluation if the symptoms worsen or if the condition fails to improve as anticipated.  I provided 11 minutes of non-face-to-face time during this encounter.    Mila Merry, MD [facility] [phone] (phone) [phone] (fax)  [facility]

## 2023-05-04 DIAGNOSIS — C44619 Basal cell carcinoma of skin of left upper limb, including shoulder: Secondary | ICD-10-CM | POA: Diagnosis not present

## 2023-05-05 ENCOUNTER — Other Ambulatory Visit: Payer: Self-pay | Admitting: Urology

## 2023-05-06 ENCOUNTER — Other Ambulatory Visit: Payer: Self-pay | Admitting: Family Medicine

## 2023-05-06 NOTE — Telephone Encounter (Signed)
Requested medication (s) are due for refill today: yes  Requested medication (s) are on the active medication list: yes  Last refill:  06/04/22  Future visit scheduled: no  Notes to clinic:  Unable to refill per protocol, cannot delegate. Last refill by historical provider.      Requested Prescriptions  Pending Prescriptions Disp Refills   cyclobenzaprine (FLEXERIL) 10 MG tablet [Pharmacy Med Name: CYCLOBENZAPRINE 10 MG TABLET] 30 tablet 0    Sig: Take 0.5 tablets (5 mg total) by mouth 2 (two) times daily as needed.     Not Delegated - Analgesics:  Muscle Relaxants Failed - 05/06/2023 11:54 AM      Failed - This refill cannot be delegated      Passed - Valid encounter within last 6 months    Recent Outpatient Visits           3 days ago Primary hypertension   [facility] Malva Limes, MD   6 months ago Right groin pain   [place] [facility] Malva Limes, MD   7 months ago Insomnia, unspecified type   [facility] [place], [place], PA-C   1 year ago Primary hypertension   [place] [facility] Malva Limes, MD   1 year ago Pancytopenia [facility])   [place] [facility] Malva Limes, MD

## 2023-06-02 ENCOUNTER — Other Ambulatory Visit: Payer: Self-pay | Admitting: Family Medicine

## 2023-06-02 ENCOUNTER — Other Ambulatory Visit: Payer: Self-pay | Admitting: Urology

## 2023-06-02 DIAGNOSIS — G4700 Insomnia, unspecified: Secondary | ICD-10-CM

## 2023-06-02 DIAGNOSIS — H353211 Exudative age-related macular degeneration, right eye, with active choroidal neovascularization: Secondary | ICD-10-CM | POA: Diagnosis not present

## 2023-06-14 ENCOUNTER — Ambulatory Visit: Payer: Self-pay

## 2023-06-14 DIAGNOSIS — G8929 Other chronic pain: Secondary | ICD-10-CM

## 2023-06-14 NOTE — Telephone Encounter (Signed)
  Chief Complaint: Patient states he was advised by provider to increase his Gabapentin dose to 4/day and call in when RF was needed. Patient states he would like to increase the dose a little more if provider is not opposed.  Symptoms: pain- worse in afternoon Frequency: patient takes 100 mg 4 times /day  Disposition: [] ED /[] Urgent Care (no appt availability in office) / [] Appointment(In office/virtual)/ []  [facility]/ [] Home Care/ [] Refused Recommended Disposition /[] [place] [facility]/ [x]  Follow-up with PCP Additional Notes: Patient would like for provider to increase dose just a little more- but really dose not want to take medication 5 times/day   Reason for Disposition  [1] Caller has NON-URGENT medicine question about med that PCP prescribed AND [2] triager unable to answer question  Answer Assessment - Initial Assessment Questions 1. NAME of MEDICINE: "What medicine(s) are you calling about?"     Gabapentin  2. QUESTION: "What is your question?" (e.g., double dose of medicine, side effect)     Patient is requesting increase in medication dosing- he has increased to 4 times/day- he can "live with that but not quite enough" patient has been spacing them evenly- he states about this time of day he needs more- because not due until bedtime .Patient states afternoon is worse 3. PRESCRIBER: "Who prescribed the medicine?" Reason: if prescribed by specialist, call should be referred to that group.     Fisher Patient states 5 times/day would be a lot- but he just feels he needs a little more. Maybe option of changing the dose? Patient states to just let him know what provider wants him to do.  Protocols used: Medication Question Call-A-AH

## 2023-06-14 NOTE — Telephone Encounter (Signed)
Patient called, left VM to return the call to the office to speak to the NT.   Summary: Pt requests the amount of his Rx be increased   Pt requests that the Rx for gabapentin (NEURONTIN) 100 MG capsule be increased and a new Rx sent to his pharmacy.

## 2023-06-15 MED ORDER — GABAPENTIN 100 MG PO CAPS
ORAL_CAPSULE | ORAL | 0 refills | Status: DC
Start: 2023-06-15 — End: 2023-09-23

## 2023-06-24 ENCOUNTER — Other Ambulatory Visit: Payer: Self-pay | Admitting: *Deleted

## 2023-06-24 DIAGNOSIS — C61 Malignant neoplasm of prostate: Secondary | ICD-10-CM

## 2023-06-30 ENCOUNTER — Other Ambulatory Visit: Payer: PPO

## 2023-06-30 DIAGNOSIS — C61 Malignant neoplasm of prostate: Secondary | ICD-10-CM

## 2023-07-01 ENCOUNTER — Ambulatory Visit: Payer: PPO | Admitting: Physician Assistant

## 2023-07-01 LAB — PSA: Prostate Specific Ag, Serum: <0.1 ng/mL (ref 0.0–4.0)

## 2023-07-02 ENCOUNTER — Telehealth: Payer: Self-pay | Admitting: Urology

## 2023-07-02 NOTE — Telephone Encounter (Signed)
Pt returned your call and wants to know if he needs to schedule a lab appt?  He said he's not doing Eligard anymore.

## 2023-07-09 NOTE — Telephone Encounter (Signed)
1st attempt to reach pt via phone call, LVM for pt to return call.

## 2023-07-09 NOTE — Telephone Encounter (Signed)
Can you please contact this patient and see if he has changed his mind about stopping Eligard?  He is on my schedule Monday for an Eligard injection, however per chart review it appears that he wanted his last injection to be 6 months ago.  Dr. Lonna Cobb had mentioned recommending continuing Eligard if his PSA had risen, but most recent PSA remains undetectable.  If he still desires to stop ADT, can cancel his appointment with me on Monday and schedule him for a prostate cancer follow-up with Dr. Lonna Cobb in 3 months with another PSA prior.

## 2023-07-12 ENCOUNTER — Encounter: Payer: Self-pay | Admitting: Physician Assistant

## 2023-07-12 ENCOUNTER — Ambulatory Visit: Payer: PPO | Admitting: Physician Assistant

## 2023-07-12 VITALS — BP 107/61 | HR 61 | Wt 150.0 lb

## 2023-07-12 DIAGNOSIS — C61 Malignant neoplasm of prostate: Secondary | ICD-10-CM | POA: Diagnosis not present

## 2023-07-12 DIAGNOSIS — N3941 Urge incontinence: Secondary | ICD-10-CM

## 2023-07-12 LAB — BLADDER SCAN AMB NON-IMAGING

## 2023-07-12 MED ORDER — LEUPROLIDE ACETATE (6 MONTH) 45 MG ~~LOC~~ KIT
45.0000 mg | PACK | Freq: Once | SUBCUTANEOUS | Status: AC
Start: 2023-07-12 — End: 2023-07-12
  Administered 2023-07-12: 45 mg via SUBCUTANEOUS

## 2023-07-12 MED ORDER — GEMTESA 75 MG PO TABS
75.0000 mg | ORAL_TABLET | Freq: Every day | ORAL | Status: DC
Start: 2023-07-12 — End: 2023-09-11

## 2023-07-12 NOTE — Progress Notes (Signed)
Eligard SubQ Injection   Due to Prostate Cancer patient is present today for a Eligard Injection.  Medication: Eligard 6 month Dose: 45 mg  Location: left  Lot: [id number] Exp: 11/2024  Patient tolerated well, no complications were noted  Performed by: Malcolm Metro RMA  Per Carman Ching this patient is to continue therapy for 6 months . Patient's next follow up was scheduled for 01/13/24. This appointment was scheduled using wheel and given to patient today along with reminder continue on Vitamin D 800-1000iu and Calcium 1000-1200mg  daily while on Androgen Deprivation Therapy.  PA approval dates:

## 2023-07-12 NOTE — Progress Notes (Signed)
07/12/2023 2:07 PM   [name] 30-Jun-1941 [id number]  CC: Chief Complaint  Patient presents with   eligard   HPI: Ian Sanders is a 82 y.o. 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   Today he reports he previously wanted to stop ADT, and PSA was undetectable earlier this month, however since he had this appointment today he decided to get his injection anyway.  He has no acute concerns and continues to take calcium and vitamin D supplements.  He does report ongoing urge incontinence and nocturia x 1.  He is on trospium, but wonders if this can be augmented.  PVR 66mL.  PMH: Past Medical History:  Diagnosis Date   Atrial fibrillation (HCC)    Basal cell carcinoma    Benign paroxysmal positional vertigo    Cancer (HCC)    History of chicken pox    History of measles    History of mumps    Hypertension    Mononeuritis of unspecified site    Other seborrheic keratosis    Stroke (HCC) 06/2020   Transient ischemic attack (TIA), and cerebral infarction without residual deficits(V12.54)    Traumatic subdural hematoma without loss of consciousness (HCC) 06/25/2020   Unspecified hypothyroidism     Surgical History: Past Surgical History:  Procedure Laterality Date   Carotid Doppler Ultrasound  04/02/2010   Small amount Calcified plaque bilaterally, no significant stenosis.   CATARACT EXTRACTION W/PHACO Right 04/15/2021   Procedure: CATARACT EXTRACTION PHACO AND INTRAOCULAR LENS PLACEMENT (IOC) RIGHT;  Surgeon: Galen Manila, MD;  Location: [facility];  Service: Ophthalmology;  Laterality: Right;  7.98 0:47.2   CATARACT EXTRACTION W/PHACO Left 05/20/2021   Procedure: CATARACT EXTRACTION PHACO AND INTRAOCULAR LENS PLACEMENT (IOC) LEFT 3.37 00:32.0;  Surgeon: Galen Manila, MD;  Location: [facility];  Service: Ophthalmology;   Laterality: Left;  Requests arrival 10A or after   COLONOSCOPY  05/11/2014   Tubular Adenoma. Dr. Servando Snare   COLONOSCOPY WITH PROPOFOL N/A 05/23/2018   Procedure: COLONOSCOPY WITH PROPOFOL;  Surgeon: Wyline Mood, MD;  Location: [facility] ENDOSCOPY;  Service: Gastroenterology;  Laterality: N/A;   DOPPLER ECHOCARDIOGRAPHY  04/02/2010   Mild left atrial dilation. Normal right atrium. No valvular disease. No thrombus. Normal LV function. EF>55%   ESOPHAGOGASTRODUODENOSCOPY (EGD) WITH PROPOFOL N/A 08/16/2019   Procedure: ESOPHAGOGASTRODUODENOSCOPY (EGD) WITH PROPOFOL;  Surgeon: Wyline Mood, MD;  Location: [facility] ENDOSCOPY;  Service: Gastroenterology;  Laterality: N/A;   FINGER AMPUTATION     HERNIA REPAIR Right    MRI Brain with and without contrast  03/20/2010   Suggestive of basal ganglia lacunar infarct   PROSTATECTOMY  11/16/1993   Abdominal   SKIN SURGERY     multiple   Thumb surgery      Home Medications:  Allergies as of 07/12/2023       Reactions   Lisinopril Swelling   Tongue swelling   Myrbetriq [mirabegron] Swelling   Of the tongue   Beta Adrenergic Blockers    Other Reaction(s): Other (See Comments) Symptomatic bradycardia   Other Other (See Comments)   Symptomatic bradycardia    Triamterene    Other reaction(s): ITCHING,WATERING EYES Other Reaction(s): ITCHING,WATERING EYES   Amlodipine Other (See Comments)   Peripheral edema   Atorvastatin Other (See Comments)   Dizziness and Fatigue   Dabigatran Etexilate Mesylate Other (See Comments)   Stomach ulcers (  Pradaxa)   Dabigatran Etexilate Mesylate Other (See Comments)   Stomach ulcers (Pradaxa)        Medication List        Accurate as of July 12, 2023  2:07 PM. If you have any questions, ask your nurse or doctor.          Animi-3 1 MG Caps Take 1 capsule by mouth 1 day or 1 dose.   calcium-vitamin D 250-125 MG-UNIT tablet Commonly known as: OSCAL Take 1 tablet by mouth 2 (two) times daily.   chlorthalidone  25 MG tablet Commonly known as: HYGROTON Take 1 tablet (25 mg total) by mouth daily. Take 25 mg by mouth daily.   cyanocobalamin 1000 MCG tablet Take 1 tablet by mouth daily.   cyclobenzaprine 10 MG tablet Commonly known as: FLEXERIL Take 0.5 tablets (5 mg total) by mouth 2 (two) times daily as needed.   diltiazem 120 MG 24 hr capsule Commonly known as: CARDIZEM CD Take 120 mg by mouth daily.   Ferrous Sulfate 134 MG Tabs Take 1 tablet by mouth daily.   flecainide 50 MG tablet Commonly known as: TAMBOCOR Take 1 tablet (50 mg total) by mouth 2 (two) times daily.   gabapentin 100 MG capsule Commonly known as: NEURONTIN Take one in the morning, one in the afternoon, and three at night What changed: additional instructions   Gemtesa 75 MG Tabs Generic drug: Vibegron Take 1 tablet (75 mg total) by mouth daily. Started by: Carman Ching   Leuprolide Acetate (6 Month) 45 MG injection Commonly known as: LUPRON Inject 45 mg into the muscle every 6 (six) months.   losartan 25 MG tablet Commonly known as: COZAAR Take 1 tablet by mouth daily.   meclizine 25 MG tablet Commonly known as: ANTIVERT Take 1 tablet by mouth daily.   omeprazole 20 MG capsule Commonly known as: PRILOSEC Take 1 capsule by mouth daily.   polyvinyl alcohol 1.4 % ophthalmic solution Commonly known as: LIQUIFILM TEARS 1 drop as needed.   Potassium 99 MG Tabs Take 1 tablet by mouth daily.   traMADol 50 MG tablet Commonly known as: ULTRAM Take 1 tablet (50 mg total) by mouth every 6 (six) hours as needed.   trospium 20 MG tablet Commonly known as: SANCTURA Take 1 tablet (20 mg total) by mouth 2 (two) times daily.   zolpidem 10 MG tablet Commonly known as: AMBIEN TAKE ONE-HALF TO ONE TABLET AT BEDTIME.        Allergies:  Allergies  Allergen Reactions   Lisinopril Swelling    Tongue swelling   Myrbetriq [Mirabegron] Swelling    Of the tongue   Beta Adrenergic Blockers      Other Reaction(s): Other (See Comments)  Symptomatic bradycardia   Other Other (See Comments)    Symptomatic bradycardia    Triamterene     Other reaction(s): ITCHING,WATERING EYES  Other Reaction(s): ITCHING,WATERING EYES   Amlodipine Other (See Comments)    Peripheral edema   Atorvastatin Other (See Comments)    Dizziness and Fatigue   Dabigatran Etexilate Mesylate Other (See Comments)    Stomach ulcers (Pradaxa)   Dabigatran Etexilate Mesylate Other (See Comments)    Stomach ulcers (Pradaxa)    Family History: Family History  Problem Relation Age of Onset   Heart disease Mother 18       CABG   Hypertension Mother    Heart attack Mother    Hypertension Brother    Heart disease Sister  stents   Prostate cancer Brother    Kidney cancer Brother    Skin cancer Brother    Hypertension Brother    Bladder Cancer Sister    Leukemia Father    Stroke Father        multiple   Cerebral aneurysm Sister    Kidney cancer Brother     Social History:   reports that he has quit smoking. His smoking use included cigarettes. His smokeless tobacco use includes chew. He reports that he does not currently use alcohol after a past usage of about 5.0 standard drinks of alcohol per week. He reports that he does not use drugs.  Physical Exam: BP 107/61   Pulse 61   Wt 150 lb (68 kg)   BMI 21.52 kg/m   Constitutional:  Alert and oriented, no acute distress, nontoxic appearing HEENT: [place], AT Cardiovascular: No clubbing, cyanosis, or edema Respiratory: Normal respiratory effort, no increased work of breathing Skin: No rashes, bruises or suspicious lesions Neurologic: Grossly intact, no focal deficits, moving all 4 extremities Psychiatric: Normal mood and affect  Laboratory Data: Results for orders placed or performed in visit on 07/12/23  BLADDER SCAN AMB NON-IMAGING  Result Value Ref Range   Scan Result 66ml    Assessment & Plan:   1. Prostate cancer (HCC) Excellent  biochemical control.  47-month Eligard administered today, see separate procedure note.  I encouraged him to continue daily calcium and vitamin D. - leuprolide (6 Month) (ELIGARD) injection 45 mg  2. Urge incontinence Not at treatment goal on trospium.  Emptying appropriately.  We discussed augmenting with Gemtesa.  Samples provided today. - BLADDER SCAN AMB NON-IMAGING - Vibegron (GEMTESA) 75 MG TABS; Take 1 tablet (75 mg total) by mouth daily.  Return in about 6 months (around 01/12/2024) for Eligard.  Carman Ching, PA-C  [facility] [address], [address] [place], [place] [address] [phone]

## 2023-07-14 ENCOUNTER — Telehealth: Payer: Self-pay

## 2023-07-14 NOTE — Telephone Encounter (Signed)
Patient left message asking if he needs to continue taking Trospium with Gemtesa?

## 2023-07-15 NOTE — Telephone Encounter (Signed)
Pt informed, voiced understanding

## 2023-07-15 NOTE — Telephone Encounter (Signed)
Yes, take both. Can consider stopping trospium in the future if doing well.

## 2023-08-02 ENCOUNTER — Other Ambulatory Visit: Payer: Self-pay | Admitting: Urology

## 2023-08-02 ENCOUNTER — Other Ambulatory Visit: Payer: Self-pay | Admitting: Family Medicine

## 2023-08-02 ENCOUNTER — Other Ambulatory Visit: Payer: Self-pay | Admitting: *Deleted

## 2023-08-02 DIAGNOSIS — G4700 Insomnia, unspecified: Secondary | ICD-10-CM

## 2023-08-02 DIAGNOSIS — I10 Essential (primary) hypertension: Secondary | ICD-10-CM

## 2023-08-02 MED ORDER — GEMTESA 75 MG PO TABS: 75.0000 mg | ORAL_TABLET | Freq: Every day | ORAL | 11 refills | Status: DC

## 2023-08-04 DIAGNOSIS — H353211 Exudative age-related macular degeneration, right eye, with active choroidal neovascularization: Secondary | ICD-10-CM | POA: Diagnosis not present

## 2023-08-13 DIAGNOSIS — Z8673 Personal history of transient ischemic attack (TIA), and cerebral infarction without residual deficits: Secondary | ICD-10-CM | POA: Diagnosis not present

## 2023-08-13 DIAGNOSIS — Z8679 Personal history of other diseases of the circulatory system: Secondary | ICD-10-CM | POA: Diagnosis not present

## 2023-08-13 DIAGNOSIS — G3184 Mild cognitive impairment, so stated: Secondary | ICD-10-CM | POA: Diagnosis not present

## 2023-08-13 DIAGNOSIS — G250 Essential tremor: Secondary | ICD-10-CM | POA: Diagnosis not present

## 2023-08-13 DIAGNOSIS — G20C Parkinsonism, unspecified: Secondary | ICD-10-CM | POA: Diagnosis not present

## 2023-08-26 ENCOUNTER — Telehealth: Payer: Self-pay | Admitting: Family Medicine

## 2023-08-26 NOTE — Telephone Encounter (Signed)
PT is calling in because he needs a handicap sticker for his car and wants to know the process in obtaining one. Please follow up with pt.

## 2023-08-27 DIAGNOSIS — Z0279 Encounter for issue of other medical certificate: Secondary | ICD-10-CM

## 2023-08-27 NOTE — Telephone Encounter (Signed)
DMV formed Printed and signed for him to take to [facility]

## 2023-08-27 NOTE — Telephone Encounter (Signed)
Patient advised. Reports his wife will pick it up

## 2023-09-02 ENCOUNTER — Other Ambulatory Visit: Payer: Self-pay | Admitting: Family Medicine

## 2023-09-02 DIAGNOSIS — G4700 Insomnia, unspecified: Secondary | ICD-10-CM

## 2023-09-02 NOTE — Telephone Encounter (Signed)
Requested medication (s) are due for refill today: Yes  Requested medication (s) are on the active medication list: Yes  Last refill:  08/04/23  Future visit scheduled: No  Notes to clinic:   Unable to refill per protocol, cannot delegate.      Requested Prescriptions  Pending Prescriptions Disp Refills   zolpidem (AMBIEN) 10 MG tablet [Pharmacy Med Name: ZOLPIDEM TARTRATE 10 MG TABLET] 30 tablet 0    Sig: TAKE ONE-HALF TO ONE TABLET AT BEDTIME.     Not Delegated - Psychiatry:  Anxiolytics/Hypnotics Failed - 09/02/2023  4:04 PM      Failed - This refill cannot be delegated      Failed - Urine Drug Screen completed in last 360 days      Passed - Valid encounter within last 6 months    Recent Outpatient Visits           4 months ago Primary hypertension   [place] [facility] Malva Limes, MD   10 months ago Right groin pain   [place] [facility] Malva Limes, MD   11 months ago Insomnia, unspecified type   [facility] [place], [place], PA-C   1 year ago Primary hypertension   [place] [facility] Malva Limes, MD   2 years ago 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

## 2023-09-03 ENCOUNTER — Ambulatory Visit
Admission: RE | Admit: 2023-09-03 | Discharge: 2023-09-03 | Disposition: A | Discharge status: Home / Self Care | Payer: PPO | Source: Ambulatory Visit | Attending: Internal Medicine | Admitting: Internal Medicine

## 2023-09-03 DIAGNOSIS — N281 Cyst of kidney, acquired: Secondary | ICD-10-CM | POA: Diagnosis not present

## 2023-09-03 DIAGNOSIS — R188 Other ascites: Secondary | ICD-10-CM | POA: Diagnosis not present

## 2023-09-03 DIAGNOSIS — R161 Splenomegaly, not elsewhere classified: Secondary | ICD-10-CM

## 2023-09-03 DIAGNOSIS — K7460 Unspecified cirrhosis of liver: Secondary | ICD-10-CM | POA: Diagnosis not present

## 2023-09-03 DIAGNOSIS — D696 Thrombocytopenia, unspecified: Secondary | ICD-10-CM

## 2023-09-07 ENCOUNTER — Other Ambulatory Visit: Payer: Self-pay | Admitting: Urology

## 2023-09-07 DIAGNOSIS — D692 Other nonthrombocytopenic purpura: Secondary | ICD-10-CM | POA: Diagnosis not present

## 2023-09-07 DIAGNOSIS — I10 Essential (primary) hypertension: Secondary | ICD-10-CM | POA: Diagnosis not present

## 2023-09-07 DIAGNOSIS — Z515 Encounter for palliative care: Secondary | ICD-10-CM | POA: Diagnosis not present

## 2023-09-07 DIAGNOSIS — I4891 Unspecified atrial fibrillation: Secondary | ICD-10-CM | POA: Diagnosis not present

## 2023-09-11 ENCOUNTER — Emergency Department: Payer: PPO

## 2023-09-11 ENCOUNTER — Encounter: Payer: Self-pay | Admitting: *Deleted

## 2023-09-11 ENCOUNTER — Emergency Department
Admission: EM | Admit: 2023-09-11 | Discharge: 2023-09-11 | Disposition: A | Discharge status: Home / Self Care | Payer: PPO | Attending: Emergency Medicine | Admitting: Emergency Medicine

## 2023-09-11 ENCOUNTER — Other Ambulatory Visit: Payer: Self-pay

## 2023-09-11 DIAGNOSIS — S99912A Unspecified injury of left ankle, initial encounter: Secondary | ICD-10-CM | POA: Diagnosis present

## 2023-09-11 DIAGNOSIS — I6930 Unspecified sequelae of cerebral infarction: Secondary | ICD-10-CM | POA: Diagnosis not present

## 2023-09-11 DIAGNOSIS — R609 Edema, unspecified: Secondary | ICD-10-CM | POA: Diagnosis not present

## 2023-09-11 DIAGNOSIS — S82832A Other fracture of upper and lower end of left fibula, initial encounter for closed fracture: Secondary | ICD-10-CM | POA: Insufficient documentation

## 2023-09-11 DIAGNOSIS — W010XXA Fall on same level from slipping, tripping and stumbling without subsequent striking against object, initial encounter: Secondary | ICD-10-CM | POA: Insufficient documentation

## 2023-09-11 DIAGNOSIS — W19XXXA Unspecified fall, initial encounter: Secondary | ICD-10-CM

## 2023-09-11 DIAGNOSIS — S82402A Unspecified fracture of shaft of left fibula, initial encounter for closed fracture: Secondary | ICD-10-CM | POA: Diagnosis not present

## 2023-09-11 DIAGNOSIS — R2681 Unsteadiness on feet: Secondary | ICD-10-CM | POA: Diagnosis not present

## 2023-09-11 DIAGNOSIS — I10 Essential (primary) hypertension: Secondary | ICD-10-CM | POA: Diagnosis not present

## 2023-09-11 DIAGNOSIS — S82492A Other fracture of shaft of left fibula, initial encounter for closed fracture: Secondary | ICD-10-CM | POA: Diagnosis not present

## 2023-09-11 MED ORDER — OXYCODONE-ACETAMINOPHEN 5-325 MG PO TABS
1.0000 | ORAL_TABLET | Freq: Once | ORAL | Status: AC
  Administered 2023-09-11: 1 via ORAL
  Filled 2023-09-11: qty 1

## 2023-09-11 MED ORDER — OXYCODONE-ACETAMINOPHEN 5-325 MG PO TABS: 1.0000 | ORAL_TABLET | ORAL | 0 refills | Status: DC | PRN

## 2023-09-11 MED ORDER — PANTOPRAZOLE SODIUM 40 MG PO TBEC: 40.0000 mg | DELAYED_RELEASE_TABLET | Freq: Every day | ORAL | Status: DC

## 2023-09-11 MED ORDER — OXYCODONE-ACETAMINOPHEN 5-325 MG PO TABS: 1.0000 | ORAL_TABLET | Freq: Four times a day (QID) | ORAL | Status: DC | PRN

## 2023-09-11 MED ORDER — ZOLPIDEM TARTRATE 5 MG PO TABS: 5.0000 mg | ORAL_TABLET | Freq: Every evening | ORAL | Status: DC | PRN

## 2023-09-11 MED ORDER — LOSARTAN POTASSIUM 50 MG PO TABS: 25.0000 mg | ORAL_TABLET | Freq: Every day | ORAL | Status: DC

## 2023-09-11 MED ORDER — CHLORTHALIDONE 25 MG PO TABS: 25.0000 mg | ORAL_TABLET | Freq: Every day | ORAL | Status: DC

## 2023-09-11 MED ORDER — DILTIAZEM HCL ER COATED BEADS 120 MG PO CP24: 120.0000 mg | ORAL_CAPSULE | Freq: Every day | ORAL | Status: DC

## 2023-09-11 MED ORDER — ACETAMINOPHEN 500 MG PO TABS
500.0000 mg | ORAL_TABLET | Freq: Once | ORAL | Status: AC
  Administered 2023-09-11: 500 mg via ORAL
  Filled 2023-09-11: qty 1

## 2023-09-11 MED ORDER — GABAPENTIN 100 MG PO CAPS: 200.0000 mg | ORAL_CAPSULE | Freq: Two times a day (BID) | ORAL | Status: DC

## 2023-09-11 MED ORDER — FERROUS SULFATE 134 MG PO TABS: 1.0000 | ORAL_TABLET | Freq: Every day | ORAL | Status: DC

## 2023-09-11 NOTE — ED Notes (Signed)
Pharmacy tech notified of patient's status.

## 2023-09-11 NOTE — ED Notes (Signed)
PT in room now.

## 2023-09-11 NOTE — Evaluation (Signed)
Physical Therapy Evaluation Patient Details Name: Ian Sanders MRN: [id number] DOB: August 11, 1941 Today's Date: 09/11/2023  History of Present Illness  Pt ia 82 yo white male who presents to ED with mechanical fall with resulting left ankle injury. His pmh includes Afib, HTN, CVA with right sided weakness.  Clinical Impression  Pt presents alert and oriented agreeable to evaluation. He demonstrates increased pain in left foot from ankle injury as well as increased edema. Despite pt's left ankle injury, pt's functional mobility is close to baseline. Pt screened for orthostatic with no change in BP due to positional changes. Pt describes episode of retropulsion that occurred around setting of fall, but this was not observed during session.       If plan is discharge home, recommend the following: A little help with walking and/or transfers   Can travel by private vehicle        Equipment Recommendations    Recommendations for Other Services       Functional Status Assessment Patient has not had a recent decline in their functional status     Precautions / Restrictions Precautions Precautions: Fall Restrictions Weight Bearing Restrictions: Yes LLE Weight Bearing: Weight bearing as tolerated Other Position/Activity Restrictions: Use of cam boot on left foot while walking.      Mobility  Bed Mobility                    Transfers Overall transfer level: Modified independent Equipment used: Standard walker                    Ambulation/Gait Ambulation/Gait assistance: Modified independent (Device/Increase time)   Assistive device: Rolling walker (2 wheels) Gait Pattern/deviations: Step-to pattern Gait velocity: Slow        Stairs            Wheelchair Mobility     Tilt Bed    Modified Rankin (Stroke Patients Only)       Balance Overall balance assessment: Modified Independent                                            Pertinent Vitals/Pain Pain Assessment Pain Assessment: No/denies pain    Home Living Family/patient expects to be discharged to:: Private residence Living Arrangements: Spouse/significant other Available Help at Discharge: Family Type of Home: House Home Access: Stairs to enter Entrance Stairs-Rails: Right Entrance Stairs-Number of Steps: 3   Home Layout: One level Home Equipment: Agricultural consultant (2 wheels)      Prior Function Prior Level of Function : Independent/Modified Independent             Mobility Comments: Used 2WW when walking around home.       Extremity/Trunk Assessment   Upper Extremity Assessment Upper Extremity Assessment: Defer to OT evaluation    Lower Extremity Assessment Lower Extremity Assessment: LLE deficits/detail LLE: Unable to fully assess due to pain    Cervical / Trunk Assessment Cervical / Trunk Assessment: Kyphotic  Communication   Communication Communication: No apparent difficulties Cueing Techniques: Verbal cues;Gestural cues  Cognition Arousal: Alert Behavior During Therapy: WFL for tasks assessed/performed Overall Cognitive Status: Within Functional Limits for tasks assessed  General Comments      Exercises     Assessment/Plan    PT Assessment All further PT needs can be met in the next venue of care;Patient does not need any further PT services  PT Problem List         PT Treatment Interventions      PT Goals (Current goals can be found in the Care Plan section)  Acute Rehab PT Goals Patient Stated Goal: To return home and to know why he is falling. PT Goal Formulation: With patient Time For Goal Achievement: 09/25/23    Frequency       Co-evaluation               AM-PAC PT "6 Clicks" Mobility  Outcome Measure Help needed turning from your back to your side while in a flat bed without using bedrails?: None Help needed moving from  lying on your back to sitting on the side of a flat bed without using bedrails?: None Help needed moving to and from a bed to a chair (including a wheelchair)?: None Help needed standing up from a chair using your arms (e.g., wheelchair or bedside chair)?: None Help needed to walk in hospital room?: None Help needed climbing 3-5 steps with a railing? : A Little 6 Click Score: 23    End of Session Equipment Utilized During Treatment: Gait belt Activity Tolerance: Patient tolerated treatment well Patient left: in bed Nurse Communication: Mobility status      Time: 1415-1446 PT Time Calculation (min) (ACUTE ONLY): 31 min   Charges:   PT Evaluation $PT Eval Low Complexity: 1 Low PT Treatments $Therapeutic Activity: 23-37 mins PT General Charges $$ ACUTE PT VISIT: 1 Visit        Ellin Goodie PT, DPT  [facility] [address], [place], [address] Phone: [phone]   Fax:  [phone]

## 2023-09-11 NOTE — ED Triage Notes (Signed)
Per EMT report, patient had a witnessed fall at home. Patient states he was walking in the house and his legs "gave way." Patient denies dizziness or LOC. Patient has right side deficit from a remote stroke. Left foot is swollen, patient is unable to weight bear on left leg due to pain.   Blood sugar is 135 B/p 156/73 Pulse 64 94% room air

## 2023-09-11 NOTE — ED Notes (Signed)
Patient could tolerate weight-bearing on left leg with CAM boot on, which was placed per Dr. Awanda Mink verbal order. Patient could not maintain his balance while standing and kept going backwards towards the stretcher. Patient states that he has had problem with his balance for "a few days."

## 2023-09-11 NOTE — ED Provider Notes (Addendum)
[facility] Provider Note    Event Date/Time   First MD Initiated Contact with Patient 09/11/23 1104     (approximate)  History   Chief Complaint: Fall  HPI  Ian Sanders is a 82 y.o. male with a past medical history of atrial fibrillation, hypertension, CVA, presents to the emergency department for mechanical fall.  According to the patient he tripped falling onto his left side.  Patient denies hitting his head.  No LOC.  No anticoagulation per patient.  I reviewed the patient's medications I do not see any anticoagulation either.  Denies any headache or head symptoms.  Patient's main concern is left ankle pain and swelling unable to bear weight on the left ankle.  Patient has right-sided deficits from a prior CVA which is what he believes caused the fall.  Physical Exam   Triage Vital Signs: ED Triage Vitals  Encounter Vitals Group     BP 09/11/23 1037 (!) 156/57     Systolic BP Percentile --      Diastolic BP Percentile --      Pulse Rate 09/11/23 1037 65     Resp 09/11/23 1037 16     Temp 09/11/23 1037 97.8 F (36.6 C)     Temp Source 09/11/23 1037 Oral     SpO2 09/11/23 1037 93 %     Weight 09/11/23 1037 146 lb (66.2 kg)     Height 09/11/23 1037 5\' 10"  (1.778 m)     Head Circumference --      Peak Flow --      Pain Score 09/11/23 1042 9     Pain Loc --      Pain Education --      Exclude from Growth Chart --     Most recent vital signs: Vitals:   09/11/23 1037  BP: (!) 156/57  Pulse: 65  Resp: 16  Temp: 97.8 F (36.6 C)  SpO2: 93%    General: Awake, no distress.  CV:  Good peripheral perfusion.  Regular rate and rhythm  Resp:  Normal effort.  Equal breath sounds bilaterally.  Abd:  No distention.  Soft, nontender.  No rebound or guarding. Other:  Moderate swelling mild ecchymosis of the left ankle more so to the lateral aspect with moderate tenderness to palpation.  Neurovascularly intact distally.  No hip or knee tenderness.  No  other traumatic findings on exam.   ED Results / Procedures / Treatments   RADIOLOGY  I have reviewed and interpreted the x-ray images.  Patient appears to have a lateral malleolus/distal fibula fracture.  Does not appear dislocated.  Awaiting radiology read.   MEDICATIONS ORDERED IN ED: Medications - No data to display   IMPRESSION / MDM / ASSESSMENT AND PLAN / ED COURSE  I reviewed the triage vital signs and the nursing notes.  Patient's presentation is most consistent with acute presentation with potential threat to life or bodily function.  Patient presents emergency department after a fall with significant left ankle pain and swelling unable to bear weight.  Exam shows significant swelling and tenderness to palpation but neurovascularly intact.  Patient's x-ray appears consistent with distal fibula fracture.  I spoke with Dr. Allena Katz of podiatry who recommends placing the patient in a cam boot and then ambulating with a walker.  Patient was given pain medication, states the pain had decreased considerably with the medication attempted to ambulate in a cam boot with a walker patient is unable to do  so due to pain and balance issues with his already chronic right lower extremity weakness.  Given the patient's inability to ambulate safely we will have social work and physical therapy evaluate for possible rehab placement.  Physical therapist has seen and evaluated.  They were able to get the patient up with a walker in his cam boot.  Patient is now able to ambulate.  Physical therapist states he believes the patient is safe to go home from his standpoint.  Patient is in agreement and wishes to go home.  The wife is here with the patient now.  Will discharge with pain medication patient will continue to use his walker and cam boot at home follow-up with podiatry.  FINAL CLINICAL IMPRESSION(S) / ED DIAGNOSES   Distal fibula fracture   Note:  This document was prepared using Dragon voice  recognition software and may include unintentional dictation errors.   Minna Antis, MD 09/11/23 1331    Minna Antis, MD 09/11/23 1511

## 2023-09-11 NOTE — Discharge Instructions (Addendum)
Please wear your boot during the day.  You may take this off at night to sleep.  Please use your walker at home.  Please take your pain medication as needed but only as prescribed.  Do not drink alcohol or drive while taking this medication.  Please call the number provided for podiatry to arrange a follow-up appointment.

## 2023-09-11 NOTE — ED Notes (Signed)
Patient declined discharge vital signs. 

## 2023-09-14 ENCOUNTER — Ambulatory Visit: Payer: Self-pay

## 2023-09-14 NOTE — Telephone Encounter (Signed)
Chief Complaint: back pain from fall on 09/08/23 Symptoms: pain 9/10 when it hurts, decreased urine output due to not enough intake of liquids Frequency: onset since 09/08/23 Pertinent Negatives: Patient denies other symptoms Disposition: [] ED /[] Urgent Care (no appt availability in office) / [x] Appointment(In office/virtual)/ []  [facility]/ [] Home Care/ [] Refused Recommended Disposition /[] [facility]/ []  Follow-up with PCP Additional Notes: Patient fell on 09/08/23 and hurt his back, then fell again on Saturday and has a fracture to the ankle. He says he's needing help at home is the main reason for the call. Advised OV is needed, no availability with PCP, scheduled tomorrow with Dr. Payton Mccallum.   Reason for Disposition  [1] MODERATE back pain (e.g., interferes with normal activities) AND [2] present > 3 days  Answer Assessment - Initial Assessment Questions 1. ONSET: "When did the pain begin?"      Ongoing since Wednesday 2. LOCATION: "Where does it hurt?" (upper, mid or lower back)     Lower back above tailbone 3. SEVERITY: "How bad is the pain?"  (e.g., Scale 1-10; mild, moderate, or severe)   - MILD (1-3): Doesn't interfere with normal activities.    - MODERATE (4-7): Interferes with normal activities or awakens from sleep.    - SEVERE (8-10): Excruciating pain, unable to do any normal activities.      9 when it hurts 4. PATTERN: "Is the pain constant?" (e.g., yes, no; constant, intermittent)      Intermittent with sneezing, coughing, movement 5. RADIATION: "Does the pain shoot into your legs or somewhere else?"     No 6. CAUSE:  "What do you think is causing the back pain?"      From the fall 7. MEDICINES: "What have you taken so far for the pain?" (e.g., nothing, acetaminophen, NSAIDS)     Oxycodone w/tylenol 8. NEUROLOGIC SYMPTOMS: "Do you have any weakness, numbness, or problems with bowel/bladder control?"     No 9. OTHER SYMPTOMS: "Do you have any other  symptoms?" (e.g., fever, abdomen pain, burning with urination, blood in urine)      Not a lot of urine due to not enough fluid intake  Protocols used: Back Pain-A-AH

## 2023-09-15 ENCOUNTER — Other Ambulatory Visit: Payer: Self-pay | Admitting: Family Medicine

## 2023-09-15 ENCOUNTER — Ambulatory Visit (INDEPENDENT_AMBULATORY_CARE_PROVIDER_SITE_OTHER): Payer: PPO | Admitting: Family Medicine

## 2023-09-15 ENCOUNTER — Encounter: Payer: Self-pay | Admitting: Family Medicine

## 2023-09-15 VITALS — BP 130/57 | HR 69 | Wt 146.0 lb

## 2023-09-15 DIAGNOSIS — G4700 Insomnia, unspecified: Secondary | ICD-10-CM | POA: Diagnosis not present

## 2023-09-15 DIAGNOSIS — S82832D Other fracture of upper and lower end of left fibula, subsequent encounter for closed fracture with routine healing: Secondary | ICD-10-CM

## 2023-09-15 DIAGNOSIS — R2689 Other abnormalities of gait and mobility: Secondary | ICD-10-CM | POA: Insufficient documentation

## 2023-09-15 DIAGNOSIS — Z8673 Personal history of transient ischemic attack (TIA), and cerebral infarction without residual deficits: Secondary | ICD-10-CM

## 2023-09-15 DIAGNOSIS — Z23 Encounter for immunization: Secondary | ICD-10-CM | POA: Diagnosis not present

## 2023-09-15 HISTORY — DX: Other fracture of upper and lower end of left fibula, subsequent encounter for closed fracture with routine healing: S82.832D

## 2023-09-15 MED ORDER — ZOLPIDEM TARTRATE 10 MG PO TABS: ORAL_TABLET | ORAL | 0 refills | Status: DC

## 2023-09-15 NOTE — Progress Notes (Signed)
DME Orders sent adapt through the community message.

## 2023-09-15 NOTE — Assessment & Plan Note (Addendum)
As noted below, patient already had balance issues due to his history of CVA with right-sided weakness, as well as neuropathy.  He suffered 2 falls recently with a second resulting in his current fracture.  Discussed that we do not really have a path to be able to place him in an inpatient rehabilitation from the outpatient setting, particularly as he was evaluated by physical therapy in the emergency department and deemed capable of going home. However, given the severe limitation to patient's basic ADLs resulting from this fracture, we will refer him to home health for physical therapy and skilled nursing assistance. Also ordered (in a separate order set) a bedside commode, hospital bed and electric wheelchair.  Discussed with his family that the insurance may not cover the electric wheelchair, in which case we will go ahead and order a standard wheelchair.

## 2023-09-15 NOTE — Assessment & Plan Note (Signed)
Short-term refill.  Advised patient to schedule follow-up for chronic conditions.

## 2023-09-15 NOTE — Progress Notes (Signed)
Established patient visit   Patient: Ian Sanders   DOB: Jun 01, 1941   82 y.o. Male  MRN: [id number] Visit Date: 09/15/2023  Today's healthcare provider: Sherlyn Hay, DO   Chief Complaint  Patient presents with   Fall    09/08/23 and again on Saturday. Patient is having back pain due to fall. Associated with fractured ankle and he is needing home health. Patient reports he is not against a rehab if he has to go. Hospital bed, bedside commode, electric scooter    Back Pain   Subjective    Fall Pertinent negatives include no headaches or numbness.  Daughter-in-law Victorino Dike  Lives with 18 lb 82 year old wife who has significant difficulty taking care of him.  On 09/08/2023 and again on Saturday, 09/11/2023. Sent home from ER on Saturday with boot after sustaining a fall which fractured his distal left fibula. His basic activities of daily living, which he was generally able to do prior to his fall, are severely limited by his fracture. He spent one hour this morning trying to get out of bed on his own, unsuccessfully, prior to the time his daughter-in-law was going to go in and help him up.  They note he is willing to do rehab but he has not willing to have surgery solely to be able to do it.  Hx a-fib on diltiazem Hx CVA with residual right-sided weakness.     Medications: Outpatient Medications Prior to Visit  Medication Sig   calcium-vitamin D (OSCAL) 250-125 MG-UNIT per tablet Take 1 tablet by mouth 2 (two) times daily.    chlorthalidone (HYGROTON) 25 MG tablet Take 1 tablet (25 mg total) by mouth daily. Take 25 mg by mouth daily.   cyanocobalamin 1000 MCG tablet Take 1 tablet by mouth daily.   cyclobenzaprine (FLEXERIL) 10 MG tablet Take 0.5 tablets (5 mg total) by mouth 2 (two) times daily as needed.   diltiazem (CARDIZEM CD) 120 MG 24 hr capsule Take 120 mg by mouth daily.   FA-B6-B12-D-Omega 3-Phytoster (ANIMI-3) 1 MG CAPS Take 1 capsule by mouth 1 day or  1 dose.   Ferrous Sulfate 134 MG TABS Take 1 tablet by mouth daily.   flecainide (TAMBOCOR) 50 MG tablet Take 1 tablet (50 mg total) by mouth 2 (two) times daily.   gabapentin (NEURONTIN) 100 MG capsule Take one in the morning, one in the afternoon, and three at night (Patient taking differently: Take 2  in the AM, 3 @ bedtime)   Leuprolide Acetate, 6 Month, (LUPRON) 45 MG injection Inject 45 mg into the muscle every 6 (six) months.   losartan (COZAAR) 25 MG tablet Take 1 tablet by mouth 2 (two) times daily.   meclizine (ANTIVERT) 25 MG tablet Take 1 tablet by mouth daily.   omeprazole (PRILOSEC) 40 MG capsule Take 40 mg by mouth daily.   oxyCODONE-acetaminophen (PERCOCET) 5-325 MG tablet Take 1 tablet by mouth every 4 (four) hours as needed for severe pain (pain score 7-10).   polyvinyl alcohol (LIQUIFILM TEARS) 1.4 % ophthalmic solution 1 drop as needed.   propranolol (INDERAL) 10 MG tablet Take 1 tablet by mouth 2 (two) times daily.   traMADol (ULTRAM) 50 MG tablet Take 1 tablet (50 mg total) by mouth every 6 (six) hours as needed.   trospium (SANCTURA) 20 MG tablet Take 1 tablet (20 mg total) by mouth 2 (two) times daily.   Vibegron (GEMTESA) 75 MG TABS Take 1 tablet (75 mg  total) by mouth daily.   [DISCONTINUED] zolpidem (AMBIEN) 10 MG tablet TAKE ONE-HALF TO ONE TABLET AT BEDTIME. (Patient taking differently: Take 10 mg by mouth at bedtime.)   No facility-administered medications prior to visit.    Review of Systems  Respiratory: Negative.  Negative for cough, shortness of breath and wheezing.   Cardiovascular:  Negative for chest pain, palpitations and leg swelling.  Skin:  Negative for color change.  Neurological:  Negative for weakness, numbness and headaches.        Objective    BP (!) 130/57 (BP Location: Right Arm, Patient Position: Sitting, Cuff Size: Normal)   Pulse 69   Wt 146 lb (66.2 kg)   SpO2 97%   BMI 20.95 kg/m     Physical Exam Vitals and nursing note  reviewed.  Constitutional:      General: He is not in acute distress.    Appearance: Normal appearance.  HENT:     Head: Normocephalic and atraumatic.  Eyes:     General: No scleral icterus.    Conjunctiva/sclera: Conjunctivae normal.  Cardiovascular:     Rate and Rhythm: Normal rate.  Pulmonary:     Effort: Pulmonary effort is normal.  Musculoskeletal:     Comments: Ortho-boot in place. Patient able to move all toes.  Neurological:     Mental Status: He is alert and oriented to person, place, and time. Mental status is at baseline.  Psychiatric:        Mood and Affect: Mood normal.        Behavior: Behavior normal.      No results found for any visits on 09/15/23.  Assessment & Plan    Closed fracture of distal end of left fibula with routine healing, unspecified fracture morphology, subsequent encounter Assessment & Plan: As noted below, patient already had balance issues due to his history of CVA with right-sided weakness, as well as neuropathy.  He suffered 2 falls recently with a second resulting in his current fracture.  Discussed that we do not really have a path to be able to place him in an inpatient rehabilitation from the outpatient setting, particularly as he was evaluated by physical therapy in the emergency department and deemed capable of going home. However, given the severe limitation to patient's basic ADLs resulting from this fracture, we will refer him to home health for physical therapy and skilled nursing assistance. Also ordered (in a separate order set) a bedside commode, hospital bed and electric wheelchair.  Discussed with his family that the insurance may not cover the electric wheelchair, in which case we will go ahead and order a standard wheelchair.  Orders: -     Ambulatory referral to Orthopedic Surgery -     Ambulatory referral to Home Health  History of CVA (cerebrovascular accident) -     Ambulatory referral to Orthopedic Surgery -      Ambulatory referral to Home Health  Balance problems -     Ambulatory referral to Home Health  Insomnia, unspecified type Assessment & Plan: Short-term refill.  Advised patient to schedule follow-up for chronic conditions.  Orders: -     Zolpidem Tartrate; TAKE ONE-HALF TO ONE TABLET AT BEDTIME.  Dispense: 20 tablet; Refill: 0  Immunization due -     Flu Vaccine Trivalent High Dose (Fluad)  Influenza vaccine needed -     Flu Vaccine Trivalent High Dose (Fluad)    Return in about 3 weeks (around 10/06/2023) for w/Dr. Sherrie Mustache for chronic  f/u.      I discussed the assessment and treatment plan with the patient  The patient was provided an opportunity to ask questions and all were answered. The patient agreed with the plan and demonstrated an understanding of the instructions.   The patient was advised to call back or seek an in-person evaluation if the symptoms worsen or if the condition fails to improve as anticipated.  Total time was 30 minutes. That includes chart review before the visit, the actual patient visit, and time spent on documentation after the visit.    Sherlyn Hay, DO  [facility] [phone] (phone) [phone] (fax)  [facility]

## 2023-09-16 DIAGNOSIS — S8262XA Displaced fracture of lateral malleolus of left fibula, initial encounter for closed fracture: Secondary | ICD-10-CM | POA: Diagnosis not present

## 2023-09-17 DIAGNOSIS — I10 Essential (primary) hypertension: Secondary | ICD-10-CM | POA: Diagnosis present

## 2023-09-17 DIAGNOSIS — L0390 Cellulitis, unspecified: Secondary | ICD-10-CM | POA: Diagnosis not present

## 2023-09-17 DIAGNOSIS — R9389 Abnormal findings on diagnostic imaging of other specified body structures: Secondary | ICD-10-CM | POA: Diagnosis not present

## 2023-09-17 DIAGNOSIS — R509 Fever, unspecified: Secondary | ICD-10-CM | POA: Diagnosis not present

## 2023-09-17 DIAGNOSIS — K30 Functional dyspepsia: Secondary | ICD-10-CM | POA: Diagnosis present

## 2023-09-17 DIAGNOSIS — Z79899 Other long term (current) drug therapy: Secondary | ICD-10-CM

## 2023-09-17 DIAGNOSIS — Z8042 Family history of malignant neoplasm of prostate: Secondary | ICD-10-CM

## 2023-09-17 DIAGNOSIS — R21 Rash and other nonspecific skin eruption: Secondary | ICD-10-CM | POA: Diagnosis present

## 2023-09-17 DIAGNOSIS — E876 Hypokalemia: Secondary | ICD-10-CM | POA: Diagnosis present

## 2023-09-17 DIAGNOSIS — Z823 Family history of stroke: Secondary | ICD-10-CM

## 2023-09-17 DIAGNOSIS — E871 Hypo-osmolality and hyponatremia: Secondary | ICD-10-CM | POA: Diagnosis not present

## 2023-09-17 DIAGNOSIS — J9811 Atelectasis: Secondary | ICD-10-CM | POA: Diagnosis not present

## 2023-09-17 DIAGNOSIS — I480 Paroxysmal atrial fibrillation: Secondary | ICD-10-CM | POA: Diagnosis present

## 2023-09-17 DIAGNOSIS — Z91040 Latex allergy status: Secondary | ICD-10-CM

## 2023-09-17 DIAGNOSIS — Z87891 Personal history of nicotine dependence: Secondary | ICD-10-CM

## 2023-09-17 DIAGNOSIS — R531 Weakness: Secondary | ICD-10-CM | POA: Diagnosis not present

## 2023-09-17 DIAGNOSIS — R296 Repeated falls: Secondary | ICD-10-CM | POA: Diagnosis present

## 2023-09-17 DIAGNOSIS — D61818 Other pancytopenia: Secondary | ICD-10-CM | POA: Diagnosis present

## 2023-09-17 DIAGNOSIS — Z806 Family history of leukemia: Secondary | ICD-10-CM

## 2023-09-17 DIAGNOSIS — W19XXXD Unspecified fall, subsequent encounter: Secondary | ICD-10-CM | POA: Diagnosis present

## 2023-09-17 DIAGNOSIS — Z85828 Personal history of other malignant neoplasm of skin: Secondary | ICD-10-CM

## 2023-09-17 DIAGNOSIS — Z89021 Acquired absence of right finger(s): Secondary | ICD-10-CM

## 2023-09-17 DIAGNOSIS — Z8051 Family history of malignant neoplasm of kidney: Secondary | ICD-10-CM

## 2023-09-17 DIAGNOSIS — E039 Hypothyroidism, unspecified: Secondary | ICD-10-CM | POA: Diagnosis present

## 2023-09-17 DIAGNOSIS — R609 Edema, unspecified: Secondary | ICD-10-CM | POA: Diagnosis not present

## 2023-09-17 DIAGNOSIS — S82892D Other fracture of left lower leg, subsequent encounter for closed fracture with routine healing: Secondary | ICD-10-CM

## 2023-09-17 DIAGNOSIS — L03116 Cellulitis of left lower limb: Principal | ICD-10-CM | POA: Diagnosis present

## 2023-09-17 DIAGNOSIS — Z8673 Personal history of transient ischemic attack (TIA), and cerebral infarction without residual deficits: Secondary | ICD-10-CM

## 2023-09-17 DIAGNOSIS — Z888 Allergy status to other drugs, medicaments and biological substances status: Secondary | ICD-10-CM

## 2023-09-17 DIAGNOSIS — Z8052 Family history of malignant neoplasm of bladder: Secondary | ICD-10-CM

## 2023-09-17 DIAGNOSIS — G629 Polyneuropathy, unspecified: Secondary | ICD-10-CM | POA: Diagnosis present

## 2023-09-17 DIAGNOSIS — Z808 Family history of malignant neoplasm of other organs or systems: Secondary | ICD-10-CM

## 2023-09-17 DIAGNOSIS — Z8249 Family history of ischemic heart disease and other diseases of the circulatory system: Secondary | ICD-10-CM

## 2023-09-17 DIAGNOSIS — Z1152 Encounter for screening for COVID-19: Secondary | ICD-10-CM

## 2023-09-17 DIAGNOSIS — Z8619 Personal history of other infectious and parasitic diseases: Secondary | ICD-10-CM

## 2023-09-17 DIAGNOSIS — K7460 Unspecified cirrhosis of liver: Secondary | ICD-10-CM | POA: Diagnosis present

## 2023-09-17 NOTE — ED Notes (Signed)
Patient to ED waiting room via wheelchair by EMS from home.  Per EMS patient with complaint of cellulitis to left leg.  Confirmed fx of left ankle 1 week ago, no splint due to swelling but wrapped.  Fever last night.  EMS interventions -- BP 158/70, pulse oxi 95% on room air, hr 62, cbg 141.

## 2023-09-18 ENCOUNTER — Inpatient Hospital Stay: Payer: PPO

## 2023-09-18 ENCOUNTER — Inpatient Hospital Stay
Admission: EM | Admit: 2023-09-18 | Discharge: 2023-09-23 | DRG: 603 | Disposition: A | Discharge status: Skilled Nursing Facility | Payer: PPO | Attending: Student in an Organized Health Care Education/Training Program | Admitting: Student in an Organized Health Care Education/Training Program

## 2023-09-18 ENCOUNTER — Emergency Department: Payer: PPO

## 2023-09-18 ENCOUNTER — Encounter: Payer: Self-pay | Admitting: Emergency Medicine

## 2023-09-18 ENCOUNTER — Other Ambulatory Visit: Payer: Self-pay

## 2023-09-18 DIAGNOSIS — Z8052 Family history of malignant neoplasm of bladder: Secondary | ICD-10-CM | POA: Diagnosis not present

## 2023-09-18 DIAGNOSIS — R509 Fever, unspecified: Secondary | ICD-10-CM | POA: Diagnosis not present

## 2023-09-18 DIAGNOSIS — Z8673 Personal history of transient ischemic attack (TIA), and cerebral infarction without residual deficits: Secondary | ICD-10-CM | POA: Diagnosis not present

## 2023-09-18 DIAGNOSIS — M6259 Muscle wasting and atrophy, not elsewhere classified, multiple sites: Secondary | ICD-10-CM | POA: Diagnosis not present

## 2023-09-18 DIAGNOSIS — L03116 Cellulitis of left lower limb: Secondary | ICD-10-CM | POA: Diagnosis not present

## 2023-09-18 DIAGNOSIS — J309 Allergic rhinitis, unspecified: Secondary | ICD-10-CM | POA: Diagnosis not present

## 2023-09-18 DIAGNOSIS — G4700 Insomnia, unspecified: Secondary | ICD-10-CM | POA: Diagnosis not present

## 2023-09-18 DIAGNOSIS — M62838 Other muscle spasm: Secondary | ICD-10-CM | POA: Diagnosis not present

## 2023-09-18 DIAGNOSIS — R531 Weakness: Principal | ICD-10-CM

## 2023-09-18 DIAGNOSIS — Z808 Family history of malignant neoplasm of other organs or systems: Secondary | ICD-10-CM | POA: Diagnosis not present

## 2023-09-18 DIAGNOSIS — Z806 Family history of leukemia: Secondary | ICD-10-CM | POA: Diagnosis not present

## 2023-09-18 DIAGNOSIS — R9389 Abnormal findings on diagnostic imaging of other specified body structures: Secondary | ICD-10-CM | POA: Diagnosis not present

## 2023-09-18 DIAGNOSIS — R21 Rash and other nonspecific skin eruption: Secondary | ICD-10-CM | POA: Diagnosis not present

## 2023-09-18 DIAGNOSIS — E039 Hypothyroidism, unspecified: Secondary | ICD-10-CM | POA: Diagnosis not present

## 2023-09-18 DIAGNOSIS — D61818 Other pancytopenia: Secondary | ICD-10-CM | POA: Diagnosis present

## 2023-09-18 DIAGNOSIS — N3941 Urge incontinence: Secondary | ICD-10-CM | POA: Diagnosis not present

## 2023-09-18 DIAGNOSIS — J9811 Atelectasis: Secondary | ICD-10-CM | POA: Diagnosis not present

## 2023-09-18 DIAGNOSIS — S82402D Unspecified fracture of shaft of left fibula, subsequent encounter for closed fracture with routine healing: Secondary | ICD-10-CM | POA: Diagnosis not present

## 2023-09-18 DIAGNOSIS — K7460 Unspecified cirrhosis of liver: Secondary | ICD-10-CM | POA: Diagnosis not present

## 2023-09-18 DIAGNOSIS — R42 Dizziness and giddiness: Secondary | ICD-10-CM | POA: Diagnosis not present

## 2023-09-18 DIAGNOSIS — E569 Vitamin deficiency, unspecified: Secondary | ICD-10-CM | POA: Diagnosis not present

## 2023-09-18 DIAGNOSIS — Z85828 Personal history of other malignant neoplasm of skin: Secondary | ICD-10-CM | POA: Diagnosis not present

## 2023-09-18 DIAGNOSIS — E871 Hypo-osmolality and hyponatremia: Secondary | ICD-10-CM

## 2023-09-18 DIAGNOSIS — E876 Hypokalemia: Secondary | ICD-10-CM

## 2023-09-18 DIAGNOSIS — D518 Other vitamin B12 deficiency anemias: Secondary | ICD-10-CM | POA: Diagnosis not present

## 2023-09-18 DIAGNOSIS — Z1152 Encounter for screening for COVID-19: Secondary | ICD-10-CM | POA: Diagnosis not present

## 2023-09-18 DIAGNOSIS — Z8042 Family history of malignant neoplasm of prostate: Secondary | ICD-10-CM | POA: Diagnosis not present

## 2023-09-18 DIAGNOSIS — Z8249 Family history of ischemic heart disease and other diseases of the circulatory system: Secondary | ICD-10-CM | POA: Diagnosis not present

## 2023-09-18 DIAGNOSIS — Z8051 Family history of malignant neoplasm of kidney: Secondary | ICD-10-CM | POA: Diagnosis not present

## 2023-09-18 DIAGNOSIS — M7121 Synovial cyst of popliteal space [Baker], right knee: Secondary | ICD-10-CM | POA: Diagnosis not present

## 2023-09-18 DIAGNOSIS — L0390 Cellulitis, unspecified: Secondary | ICD-10-CM | POA: Diagnosis present

## 2023-09-18 DIAGNOSIS — S82892D Other fracture of left lower leg, subsequent encounter for closed fracture with routine healing: Secondary | ICD-10-CM | POA: Diagnosis not present

## 2023-09-18 DIAGNOSIS — M7989 Other specified soft tissue disorders: Secondary | ICD-10-CM | POA: Diagnosis not present

## 2023-09-18 DIAGNOSIS — R262 Difficulty in walking, not elsewhere classified: Secondary | ICD-10-CM | POA: Diagnosis not present

## 2023-09-18 DIAGNOSIS — I4891 Unspecified atrial fibrillation: Secondary | ICD-10-CM | POA: Diagnosis not present

## 2023-09-18 DIAGNOSIS — K219 Gastro-esophageal reflux disease without esophagitis: Secondary | ICD-10-CM | POA: Diagnosis not present

## 2023-09-18 DIAGNOSIS — R296 Repeated falls: Secondary | ICD-10-CM | POA: Diagnosis not present

## 2023-09-18 DIAGNOSIS — Z8619 Personal history of other infectious and parasitic diseases: Secondary | ICD-10-CM | POA: Diagnosis not present

## 2023-09-18 DIAGNOSIS — I480 Paroxysmal atrial fibrillation: Secondary | ICD-10-CM | POA: Diagnosis not present

## 2023-09-18 DIAGNOSIS — G629 Polyneuropathy, unspecified: Secondary | ICD-10-CM | POA: Diagnosis not present

## 2023-09-18 DIAGNOSIS — Z87891 Personal history of nicotine dependence: Secondary | ICD-10-CM | POA: Diagnosis not present

## 2023-09-18 DIAGNOSIS — G894 Chronic pain syndrome: Secondary | ICD-10-CM | POA: Diagnosis not present

## 2023-09-18 DIAGNOSIS — R52 Pain, unspecified: Secondary | ICD-10-CM | POA: Diagnosis not present

## 2023-09-18 DIAGNOSIS — Z741 Need for assistance with personal care: Secondary | ICD-10-CM | POA: Diagnosis not present

## 2023-09-18 DIAGNOSIS — W19XXXD Unspecified fall, subsequent encounter: Secondary | ICD-10-CM | POA: Diagnosis present

## 2023-09-18 DIAGNOSIS — R2681 Unsteadiness on feet: Secondary | ICD-10-CM | POA: Diagnosis not present

## 2023-09-18 DIAGNOSIS — I10 Essential (primary) hypertension: Secondary | ICD-10-CM | POA: Diagnosis not present

## 2023-09-18 DIAGNOSIS — Z823 Family history of stroke: Secondary | ICD-10-CM | POA: Diagnosis not present

## 2023-09-18 DIAGNOSIS — S82892A Other fracture of left lower leg, initial encounter for closed fracture: Secondary | ICD-10-CM | POA: Diagnosis not present

## 2023-09-18 HISTORY — DX: Cellulitis of left lower limb: L03.116

## 2023-09-18 LAB — URINALYSIS, ROUTINE W REFLEX MICROSCOPIC
Bilirubin Urine: NEGATIVE
Glucose, UA: NEGATIVE mg/dL
Hgb urine dipstick: NEGATIVE
Ketones, ur: NEGATIVE mg/dL
Nitrite: NEGATIVE
Protein, ur: NEGATIVE mg/dL
Specific Gravity, Urine: 1.008 (ref 1.005–1.030)
pH: 7 (ref 5.0–8.0)

## 2023-09-18 LAB — COMPREHENSIVE METABOLIC PANEL
ALT: 12 U/L (ref 0–44)
AST: 22 U/L (ref 15–41)
Albumin: 3.8 g/dL (ref 3.5–5.0)
Alkaline Phosphatase: 92 U/L (ref 38–126)
Anion gap: 9 (ref 5–15)
BUN: 15 mg/dL (ref 8–23)
CO2: 28 mmol/L (ref 22–32)
Calcium: 8.6 mg/dL — ABNORMAL LOW (ref 8.9–10.3)
Chloride: 93 mmol/L — ABNORMAL LOW (ref 98–111)
Creatinine, Ser: 0.81 mg/dL (ref 0.61–1.24)
GFR, Estimated: >60 mL/min (ref >60)
Glucose, Bld: 118 mg/dL — ABNORMAL HIGH (ref 70–99)
Potassium: 3.1 mmol/L — ABNORMAL LOW (ref 3.5–5.1)
Sodium: 130 mmol/L — ABNORMAL LOW (ref 135–145)
Total Bilirubin: 2 mg/dL — ABNORMAL HIGH (ref 0.3–1.2)
Total Protein: 7.1 g/dL (ref 6.5–8.1)

## 2023-09-18 LAB — CBC WITH DIFFERENTIAL/PLATELET
Abs Immature Granulocytes: 0.03 10*3/uL (ref 0.00–0.07)
Basophils Absolute: 0 10*3/uL (ref 0.0–0.1)
Basophils Relative: 0 %
Eosinophils Absolute: 0.2 10*3/uL (ref 0.0–0.5)
Eosinophils Relative: 4 %
HCT: 33.8 % — ABNORMAL LOW (ref 39.0–52.0)
Hemoglobin: 11.2 g/dL — ABNORMAL LOW (ref 13.0–17.0)
Immature Granulocytes: 1 %
Lymphocytes Relative: 12 %
Lymphs Abs: 0.6 10*3/uL — ABNORMAL LOW (ref 0.7–4.0)
MCH: 26.5 pg (ref 26.0–34.0)
MCHC: 33.1 g/dL (ref 30.0–36.0)
MCV: 80.1 fL (ref 80.0–100.0)
Monocytes Absolute: 0.4 10*3/uL (ref 0.1–1.0)
Monocytes Relative: 8 %
Neutro Abs: 3.7 10*3/uL (ref 1.7–7.7)
Neutrophils Relative %: 75 %
Platelets: 95 10*3/uL — ABNORMAL LOW (ref 150–400)
RBC: 4.22 MIL/uL (ref 4.22–5.81)
RDW: 13.6 % (ref 11.5–15.5)
WBC: 4.9 10*3/uL (ref 4.0–10.5)
nRBC: 0 % (ref 0.0–0.2)

## 2023-09-18 LAB — GROUP A STREP BY PCR: Group A Strep by PCR: NOT DETECTED

## 2023-09-18 LAB — RESP PANEL BY RT-PCR (RSV, FLU A&B, COVID)  RVPGX2
Influenza A by PCR: NEGATIVE
Influenza B by PCR: NEGATIVE
Resp Syncytial Virus by PCR: NEGATIVE
SARS Coronavirus 2 by RT PCR: NEGATIVE

## 2023-09-18 LAB — TROPONIN I (HIGH SENSITIVITY): Troponin I (High Sensitivity): 5 ng/L (ref <18)

## 2023-09-18 LAB — MAGNESIUM: Magnesium: 1.7 mg/dL (ref 1.7–2.4)

## 2023-09-18 LAB — MRSA NEXT GEN BY PCR, NASAL: MRSA by PCR Next Gen: NOT DETECTED

## 2023-09-18 MED ORDER — MECLIZINE HCL 25 MG PO TABS
25.0000 mg | ORAL_TABLET | Freq: Every day | ORAL | Status: DC
  Filled 2023-09-18 (×6): qty 1

## 2023-09-18 MED ORDER — GABAPENTIN 300 MG PO CAPS
300.0000 mg | ORAL_CAPSULE | Freq: Every day | ORAL | Status: DC
  Administered 2023-09-18 – 2023-09-22 (×5): 300 mg via ORAL
  Filled 2023-09-18 (×5): qty 1

## 2023-09-18 MED ORDER — SODIUM CHLORIDE 0.9 % IV SOLN
1.0000 g | INTRAVENOUS | Status: DC
  Administered 2023-09-19 – 2023-09-23 (×5): 1 g via INTRAVENOUS
  Filled 2023-09-18 (×5): qty 10

## 2023-09-18 MED ORDER — OXYCODONE-ACETAMINOPHEN 5-325 MG PO TABS
1.0000 | ORAL_TABLET | ORAL | Status: DC | PRN
  Administered 2023-09-18 – 2023-09-23 (×9): 1 via ORAL
  Filled 2023-09-18 (×9): qty 1

## 2023-09-18 MED ORDER — OXYCODONE-ACETAMINOPHEN 5-325 MG PO TABS
1.0000 | ORAL_TABLET | Freq: Once | ORAL | Status: AC
  Administered 2023-09-18: 1 via ORAL
  Filled 2023-09-18: qty 1

## 2023-09-18 MED ORDER — HYDROMORPHONE HCL 1 MG/ML IJ SOLN: 0.5000 mg | INTRAMUSCULAR | Status: AC | PRN

## 2023-09-18 MED ORDER — GABAPENTIN 100 MG PO CAPS
100.0000 mg | ORAL_CAPSULE | Freq: Two times a day (BID) | ORAL | Status: DC
  Administered 2023-09-18 – 2023-09-23 (×11): 100 mg via ORAL
  Filled 2023-09-18 (×12): qty 1

## 2023-09-18 MED ORDER — PROPRANOLOL HCL 20 MG PO TABS
10.0000 mg | ORAL_TABLET | Freq: Two times a day (BID) | ORAL | Status: DC
  Administered 2023-09-18 – 2023-09-23 (×10): 10 mg via ORAL
  Filled 2023-09-18 (×11): qty 1

## 2023-09-18 MED ORDER — SODIUM CHLORIDE 0.9 % IV SOLN
1.0000 g | Freq: Once | INTRAVENOUS | Status: AC
  Administered 2023-09-18: 1 g via INTRAVENOUS
  Filled 2023-09-18: qty 10

## 2023-09-18 MED ORDER — OXYCODONE-ACETAMINOPHEN 5-325 MG PO TABS
1.0000 | ORAL_TABLET | ORAL | Status: AC | PRN
  Administered 2023-09-18: 1 via ORAL
  Filled 2023-09-18: qty 1

## 2023-09-18 MED ORDER — DILTIAZEM HCL ER COATED BEADS 120 MG PO CP24
120.0000 mg | ORAL_CAPSULE | Freq: Every day | ORAL | Status: DC
  Administered 2023-09-18 – 2023-09-23 (×6): 120 mg via ORAL
  Filled 2023-09-18 (×6): qty 1

## 2023-09-18 MED ORDER — ALBUTEROL SULFATE (2.5 MG/3ML) 0.083% IN NEBU: 2.5000 mg | INHALATION_SOLUTION | RESPIRATORY_TRACT | Status: AC | PRN

## 2023-09-18 MED ORDER — SODIUM CHLORIDE 0.9 % IV BOLUS
500.0000 mL | Freq: Once | INTRAVENOUS | Status: AC
  Administered 2023-09-18: 500 mL via INTRAVENOUS

## 2023-09-18 MED ORDER — POLYVINYL ALCOHOL 1.4 % OP SOLN: 1.0000 [drp] | OPHTHALMIC | Status: DC | PRN

## 2023-09-18 MED ORDER — ENOXAPARIN SODIUM 40 MG/0.4ML IJ SOSY
40.0000 mg | PREFILLED_SYRINGE | INTRAMUSCULAR | Status: DC
  Filled 2023-09-18 (×4): qty 0.4

## 2023-09-18 MED ORDER — ONDANSETRON HCL 4 MG/2ML IJ SOLN: 4.0000 mg | Freq: Three times a day (TID) | INTRAMUSCULAR | Status: AC | PRN

## 2023-09-18 MED ORDER — FESOTERODINE FUMARATE ER 4 MG PO TB24
4.0000 mg | ORAL_TABLET | Freq: Every day | ORAL | Status: DC
  Administered 2023-09-19 – 2023-09-23 (×5): 4 mg via ORAL
  Filled 2023-09-18 (×5): qty 1

## 2023-09-18 MED ORDER — BISACODYL 5 MG PO TBEC: 5.0000 mg | DELAYED_RELEASE_TABLET | Freq: Every day | ORAL | Status: DC | PRN

## 2023-09-18 MED ORDER — POTASSIUM CHLORIDE CRYS ER 20 MEQ PO TBCR
40.0000 meq | EXTENDED_RELEASE_TABLET | Freq: Once | ORAL | Status: AC
  Administered 2023-09-18: 40 meq via ORAL
  Filled 2023-09-18: qty 2

## 2023-09-18 MED ORDER — PANTOPRAZOLE SODIUM 40 MG PO TBEC
40.0000 mg | DELAYED_RELEASE_TABLET | Freq: Every day | ORAL | Status: DC
  Administered 2023-09-18 – 2023-09-23 (×6): 40 mg via ORAL
  Filled 2023-09-18 (×6): qty 1

## 2023-09-18 MED ORDER — MIRABEGRON ER 25 MG PO TB24: 25.0000 mg | ORAL_TABLET | Freq: Every day | ORAL | Status: DC

## 2023-09-18 MED ORDER — ZOLPIDEM TARTRATE 5 MG PO TABS
5.0000 mg | ORAL_TABLET | Freq: Every evening | ORAL | Status: DC | PRN
  Administered 2023-09-19 – 2023-09-21 (×2): 5 mg via ORAL
  Filled 2023-09-18 (×2): qty 1

## 2023-09-18 MED ORDER — SENNOSIDES-DOCUSATE SODIUM 8.6-50 MG PO TABS: 1.0000 | ORAL_TABLET | Freq: Every evening | ORAL | Status: DC | PRN

## 2023-09-18 MED ORDER — LOSARTAN POTASSIUM 50 MG PO TABS
25.0000 mg | ORAL_TABLET | Freq: Two times a day (BID) | ORAL | Status: DC
  Administered 2023-09-18 – 2023-09-23 (×10): 25 mg via ORAL
  Filled 2023-09-18 (×11): qty 1

## 2023-09-18 MED ORDER — FLECAINIDE ACETATE 50 MG PO TABS
50.0000 mg | ORAL_TABLET | Freq: Two times a day (BID) | ORAL | Status: DC
  Administered 2023-09-18 – 2023-09-23 (×11): 50 mg via ORAL
  Filled 2023-09-18 (×11): qty 1

## 2023-09-18 MED ORDER — CYCLOBENZAPRINE HCL 10 MG PO TABS
5.0000 mg | ORAL_TABLET | Freq: Three times a day (TID) | ORAL | Status: DC | PRN
  Administered 2023-09-19 (×2): 5 mg via ORAL
  Filled 2023-09-18 (×2): qty 1

## 2023-09-18 NOTE — ED Notes (Signed)
Informed RN Casimiro Needle via chat/ Pt has bed assigned

## 2023-09-18 NOTE — H&P (Addendum)
History and Physical    Ian Sanders RJJ:[id number] DOB: 21-Jan-1941 DOA: 09/18/2023  PCP: Malva Limes, MD (Confirm with patient/family/NH records and if not entered, this has to be entered at [facility] point of entry) Patient coming from: Home  I have personally briefly reviewed patient's old medical records in [facility]  Chief Complaint: Left leg pain and feeling weak  HPI: [name] is a 82 y.o. male with medical history significant of PAF not on anticoagulation, HTN, TIA stroke, cirrhosis with chronic thrombocytopenia, presented with worsening of left leg rash and pain and generalized weakness  Patient sustained a mechanical fall 7 days ago, and broke left ankle and came to ED.  X-ray showed distal fibula fracture in the case was reviewed by podiatry Dr. Allena Katz and patient was considered to be stable and sent home with cam boot and ambulating on a walker.  Gradually patient started noticed a new onset rash of the left foot and extends to mid shin in the last 2 days associated with pain.  No fever or chills.  Meantime patient fell even more weaker, also patient had a low-grade fever 100.12 nights ago.  ED Course: Afebrile, nontachycardic blood pressure 120/57 not hypoxic.  Blood work showed WBC 4.9, hemoglobin 11.2, K3.1, sodium 130, platelet 95.  COVID-negative  Patient was started on cefadroxil.  Review of Systems: As per HPI otherwise 14 point review of systems negative.    Past Medical History:  Diagnosis Date   Atrial fibrillation (HCC)    Basal cell carcinoma    Benign paroxysmal positional vertigo    Cancer (HCC)    History of chicken pox    History of measles    History of mumps    Hypertension    Mononeuritis of unspecified site    Other seborrheic keratosis    Stroke (HCC) 06/2020   Transient ischemic attack (TIA), and cerebral infarction without residual deficits(V12.54)    Traumatic subdural hematoma without loss of consciousness (HCC) 06/25/2020    Unspecified hypothyroidism     Past Surgical History:  Procedure Laterality Date   Carotid Doppler Ultrasound  04/02/2010   Small amount Calcified plaque bilaterally, no significant stenosis.   CATARACT EXTRACTION W/PHACO Right 04/15/2021   Procedure: CATARACT EXTRACTION PHACO AND INTRAOCULAR LENS PLACEMENT (IOC) RIGHT;  Surgeon: Galen Manila, MD;  Location: [facility];  Service: Ophthalmology;  Laterality: Right;  7.98 0:47.2   CATARACT EXTRACTION W/PHACO Left 05/20/2021   Procedure: CATARACT EXTRACTION PHACO AND INTRAOCULAR LENS PLACEMENT (IOC) LEFT 3.37 00:32.0;  Surgeon: Galen Manila, MD;  Location: [facility];  Service: Ophthalmology;  Laterality: Left;  Requests arrival 10A or after   COLONOSCOPY  05/11/2014   Tubular Adenoma. Dr. Servando Snare   COLONOSCOPY WITH PROPOFOL N/A 05/23/2018   Procedure: COLONOSCOPY WITH PROPOFOL;  Surgeon: Wyline Mood, MD;  Location: [facility] END[facility];  Service: Gastroenterology;  Laterality: N/A;   DOPPLER ECHOCARDIOGRAPHY  04/02/2010   Mild left atrial dilation. Normal right atrium. No valvular disease. No thrombus. Normal LV function. EF>55%   ESOPHAGOGASTRODUODENOSCOPY (EGD) WITH PROPOFOL N/A 08/16/2019   Procedure: ESOPHAGOGASTRODUODENOSCOPY (EGD) WITH PROPOFOL;  Surgeon: Wyline Mood, MD;  Location: [facility] END[facility];  Service: Gastroenterology;  Laterality: N/A;   FINGER AMPUTATION     HERNIA REPAIR Right    MRI Brain with and without contrast  03/20/2010   Suggestive of basal ganglia lacunar infarct   PROSTATECTOMY  11/16/1993   Abdominal   SKIN SURGERY     multiple   Thumb  surgery       reports that he has quit smoking. His smoking use included cigarettes. His smokeless tobacco use includes chew. He reports that he does not currently use alcohol after a past usage of about 5.0 standard drinks of alcohol per week. He reports that he does not use drugs.  Allergies  Allergen Reactions   Lisinopril Swelling    Tongue swelling    Myrbetriq [Mirabegron] Swelling    Of the tongue   Beta Adrenergic Blockers     Other Reaction(s): Other (See Comments)  Symptomatic bradycardia Patient has been tolerating Propranolol 10 mg twice daily since 10/2022, with no complaints   Other Other (See Comments)    Symptomatic bradycardia    Triamterene     Other reaction(s): ITCHING,WATERING EYES  Other Reaction(s): ITCHING,WATERING EYES   Amlodipine Other (See Comments)    Peripheral edema   Atorvastatin Other (See Comments)    Dizziness and Fatigue   Dabigatran Etexilate Mesylate Other (See Comments)    Stomach ulcers (Pradaxa)   Dabigatran Etexilate Mesylate Other (See Comments)    Stomach ulcers (Pradaxa)   Latex Rash    Family History  Problem Relation Age of Onset   Heart disease Mother 54       CABG   Hypertension Mother    Heart attack Mother    Hypertension Brother    Heart disease Sister        stents   Prostate cancer Brother    Kidney cancer Brother    Skin cancer Brother    Hypertension Brother    Bladder Cancer Sister    Leukemia Father    Stroke Father        multiple   Cerebral aneurysm Sister    Kidney cancer Brother     Prior to Admission medications   Medication Sig Start Date End Date Taking? Authorizing Provider  calcium-vitamin D (OSCAL) 250-125 MG-UNIT per tablet Take 1 tablet by mouth 2 (two) times daily.     [provider]  chlorthalidone (HYGROTON) 25 MG tablet Take 1 tablet (25 mg total) by mouth daily. Take 25 mg by mouth daily. 08/04/23   Malva Limes, MD  cyanocobalamin 1000 MCG tablet Take 1 tablet by mouth daily. 04/24/15   [provider]  cyclobenzaprine (FLEXERIL) 10 MG tablet Take 0.5 tablets (5 mg total) by mouth 2 (two) times daily as needed. 05/07/23   Malva Limes, MD  diltiazem (CARDIZEM CD) 120 MG 24 hr capsule Take 120 mg by mouth daily. 06/09/22   [provider]  FA-B6-B12-D-Omega 3-Phytoster (ANIMI-3) 1 MG CAPS Take 1 capsule by mouth  1 day or 1 dose. 04/24/22   [provider]  Ferrous Sulfate 134 MG TABS Take 1 tablet by mouth daily.    [provider]  flecainide (TAMBOCOR) 50 MG tablet Take 1 tablet (50 mg total) by mouth 2 (two) times daily. 07/15/17   Antonieta Iba, MD  gabapentin (NEURONTIN) 100 MG capsule Take one in the morning, one in the afternoon, and three at night Patient taking differently: Take 2  in the AM, 3 @ bedtime 06/15/23   Malva Limes, MD  Leuprolide Acetate, 6 Month, (LUPRON) 45 MG injection Inject 45 mg into the muscle every 6 (six) months.    [provider]  losartan (COZAAR) 25 MG tablet Take 1 tablet by mouth 2 (two) times daily.    [provider]  meclizine (ANTIVERT) 25 MG tablet Take 1 tablet  by mouth daily.    [provider]  omeprazole (PRILOSEC) 40 MG capsule Take 40 mg by mouth daily. 09/02/23   [provider]  oxyCODONE-acetaminophen (PERCOCET) 5-325 MG tablet Take 1 tablet by mouth every 4 (four) hours as needed for severe pain (pain score 7-10). 09/11/23   Minna Antis, MD  polyvinyl alcohol (LIQUIFILM TEARS) 1.4 % ophthalmic solution 1 drop as needed.    [provider]  propranolol (INDERAL) 10 MG tablet Take 1 tablet by mouth 2 (two) times daily. 09/02/23 09/01/24  [provider]  traMADol (ULTRAM) 50 MG tablet Take 1 tablet (50 mg total) by mouth every 6 (six) hours as needed. 04/06/23   Malva Limes, MD  trospium (SANCTURA) 20 MG tablet Take 1 tablet (20 mg total) by mouth 2 (two) times daily. 09/08/23   Stoioff, Verna Czech, MD  Vibegron (GEMTESA) 75 MG TABS Take 1 tablet (75 mg total) by mouth daily. 08/02/23   Carman Ching, PA-C  zolpidem (AMBIEN) 10 MG tablet TAKE ONE-HALF TO ONE TABLET AT BEDTIME. 09/15/23   Sherlyn Hay, DO    Physical Exam: Vitals:   09/18/23 0003 09/18/23 0456 09/18/23 0511 09/18/23 0823  BP: (!) 120/57  (!) 128/53 (!) 155/61  Pulse: 65 66  (!) 58  Resp:  20   14  Temp: 98.4 F (36.9 C)   98.3 F (36.8 C)  TempSrc: Oral     SpO2: 96% 92%  98%    Constitutional: NAD, calm, comfortable Vitals:   09/18/23 0003 09/18/23 0456 09/18/23 0511 09/18/23 0823  BP: (!) 120/57  (!) 128/53 (!) 155/61  Pulse: 65 66  (!) 58  Resp:  20  14  Temp: 98.4 F (36.9 C)   98.3 F (36.8 C)  TempSrc: Oral     SpO2: 96% 92%  98%   Eyes: PERRL, lids and conjunctivae normal ENMT: Mucous membranes are moist. Posterior pharynx clear of any exudate or lesions.Normal dentition.  Neck: normal, supple, no masses, no thyromegaly Respiratory: clear to auscultation bilaterally, no wheezing, no crackles. Normal respiratory effort. No accessory muscle use.  Cardiovascular: Regular rate and rhythm, no murmurs / rubs / gallops. No extremity edema. 2+ pedal pulses. No carotid bruits.  Abdomen: no tenderness, no masses palpated. No hepatosplenomegaly. Bowel sounds positive.  Musculoskeletal: no clubbing / cyanosis. No joint deformity upper and lower extremities. Good ROM, no contractures. Normal muscle tone.  Skin: Macular rash involving left foot and extends to the mid shin area, warm to touch and with severe tenderness with 2+ edema compared to right side Neurologic: CN 2-12 grossly intact. Sensation intact, DTR normal. Strength 5/5 in all 4.  Psychiatric: Normal judgment and insight. Alert and oriented x 3. Normal mood.     Labs on Admission: I have personally reviewed following labs and imaging studies  CBC: Recent Labs  Lab 09/18/23 0318  WBC 4.9  NEUTROABS 3.7  HGB 11.2*  HCT 33.8*  MCV 80.1  PLT 95*   Basic Metabolic Panel: Recent Labs  Lab 09/18/23 0318  NA 130*  K 3.1*  CL 93*  CO2 28  GLUCOSE 118*  BUN 15  CREATININE 0.81  CALCIUM 8.6*   GFR: Estimated Creatinine Clearance: 65.8 mL/min (by C-G formula based on SCr of 0.81 mg/dL). Liver Function Tests: Recent Labs  Lab 09/18/23 0318  AST 22  ALT 12  ALKPHOS 92  BILITOT 2.0*  PROT 7.1   ALBUMIN 3.8   No results for input(s): "LIPASE", "AMYLASE"  in the last 168 hours. No results for input(s): "AMMONIA" in the last 168 hours. Coagulation Profile: No results for input(s): "INR", "PROTIME" in the last 168 hours. Cardiac Enzymes: No results for input(s): "CKTOTAL", "CKMB", "CKMBINDEX", "TROPONINI" in the last 168 hours. BNP (last 3 results) No results for input(s): "PROBNP" in the last 8760 hours. HbA1C: No results for input(s): "HGBA1C" in the last 72 hours. CBG: No results for input(s): "GLUCAP" in the last 168 hours. Lipid Profile: No results for input(s): "CHOL", "HDL", "LDLCALC", "TRIG", "CHOLHDL", "LDLDIRECT" in the last 72 hours. Thyroid Function Tests: No results for input(s): "TSH", "T4TOTAL", "FREET4", "T3FREE", "THYROIDAB" in the last 72 hours. Anemia Panel: No results for input(s): "VITAMINB12", "FOLATE", "FERRITIN", "TIBC", "IRON", "RETICCTPCT" in the last 72 hours. Urine analysis:    Component Value Date/Time   COLORURINE YELLOW (A) 07/13/2021 0300   APPEARANCEUR Clear 10/14/2021 1354   LABSPEC 1.013 07/13/2021 0300   LABSPEC 1.009 08/10/2012 1219   PHURINE 7.0 07/13/2021 0300   GLUCOSEU Negative 10/14/2021 1354   GLUCOSEU Negative 08/10/2012 1219   HGBUR NEGATIVE 07/13/2021 0300   BILIRUBINUR Negative 10/14/2021 1354   BILIRUBINUR Negative 08/10/2012 1219   KETONESUR 5 (A) 07/13/2021 0300   PROTEINUR Negative 10/14/2021 1354   PROTEINUR NEGATIVE 07/13/2021 0300   UROBILINOGEN 0.2 04/27/2017 0949   NITRITE Negative 10/14/2021 1354   NITRITE NEGATIVE 07/13/2021 0300   LEUKOCYTESUR Negative 10/14/2021 1354   LEUKOCYTESUR NEGATIVE 07/13/2021 0300   LEUKOCYTESUR Negative 08/10/2012 1219    Radiological Exams on Admission: DG Chest Port 1 View  Result Date: 09/18/2023 CLINICAL DATA:  Weakness, fever EXAM: PORTABLE CHEST 1 VIEW COMPARISON:  07/13/2021 FINDINGS: Mild elevation of the right hemidiaphragm, progressive since prior examination.  Bibasilar atelectasis. No confluent pulmonary infiltrate. No pneumothorax or pleural effusion. Cardiac size within normal limits. Pulmonary vascularity is normal. No acute bone abnormality. IMPRESSION: 1. Mild elevation of the right hemidiaphragm, progressive since prior examination. Bibasilar atelectasis. Electronically Signed   By: Helyn Numbers M.D.   On: 09/18/2023 03:36    EKG: Independently reviewed.  Sinus rhythm, no acute ST changes.  Assessment/Plan Principal Problem:   Cellulitis of left foot Active Problems:   Cellulitis  (please populate well all problems here in Problem List. (For example, if patient is on BP meds at home and you resume or decide to hold them, it is a problem that needs to be her. 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 -D/C chlorthalidone  Hypokalemia -PO replacement  PAF -In sinus rhythm, continue Cardizem and flecainide -Not on anticoagulations for unknown reason  HTN -Stable, continue Cardizem and losartan  Chronic neuropathy -Continue gabapentin  Cirrhosis with chronic thrombocytopenia -Platelet count stable, no signs of active bleeding  DVT prophylaxis: Refused chemical DVT prophylaxis Code Status: Full code Family Communication: None at bedside Disposition Plan: Patient is sick with severe cellulitis and ambulation impairment from left ankle fracture, requiring IV antibiotics and inpatient PT evaluation, expect more than 2 midnight hospital stay, expect patient discharged to SNF Consults called: None Admission status: MedSurg admission   Emeline General MD [facility] Pager [phone]  09/18/2023, 8:49 AM

## 2023-09-18 NOTE — Evaluation (Signed)
Occupational Therapy Evaluation Patient Details Name: Ian Sanders MRN: [id number] DOB: 26-Feb-1941 Today's Date: 09/18/2023   History of Present Illness Pt. is a 82 y.o. male with medical history significant of PAF not on anticoagulation, HTN, TIA stroke, cirrhosis with chronic thrombocytopenia, presented with worsening of left leg rash and pain and generalized weakness. Patient sustained a mechanical fall 7 days ago, and broke left ankle and came to ED.  X-ray showed distal fibula fracture in the case was reviewed by podiatry Dr. Allena Katz and patient was considered to be stable and sent home with cam boot and ambulating on a walker.  Gradually patient started noticed a new onset rash of the left foot and extends to mid shin in the last 2 days associated with pain.  No fever or chills.  Meantime patient fell even more weaker, also patient had a low-grade fever 100.12 nights ago.   Clinical Impression   Pt. presents with weakness, 8/10 LLE pain, limited activity tolerance, and limited functional mobility which hinders his ability to complete basic ADL and IADL functioning.  Pt. Resides at home with his wife. Pt. has supportive children. Pt. Daughter, and wife were present during the session. Pt. progressively required more assist from his family for ADLs, IADLs, and functional transfers following his fall. Pt. required cues for encouragement to participate 2/2 fatigue, and preparing to take a nap. Pt. required modA for bed mobility to the EOB, with limited sitting balance and positive posterior leaning. Pt. Has a CAM boot to be worn on the LLE during transfers, and ambulation. Pt declined attempt donn CAM boot. Pt. Reports it is difficult to move with the CAM boot 2/2 overall weakness. Pt. fatigued quickly sitting at the EOB, and was assisted from the EOB back to supine. Pt./caregiver education was provided about a transfer tub bench vs. a standard shower chair to assist with transferring in, and out of the  shower. Pt. will benefit from OT services for ADL training, A/E training, and pt. education about home modification, and DME.        If plan is discharge home, recommend the following: A lot of help with bathing/dressing/bathroom;Assistance with cooking/housework;A lot of help with walking and/or transfers    Functional Status Assessment  Patient has had a recent decline in their functional status and/or demonstrates limited ability to make significant improvements in function in a reasonable and predictable amount of time  Equipment Recommendations  Tub/shower bench (Transfer tub bench)    Recommendations for Other Services       Precautions / Restrictions Precautions Precautions: Fall Restrictions Weight Bearing Restrictions: Yes LLE Weight Bearing: Weight bearing as tolerated Other Position/Activity Restrictions: Use of cam boot on left foot while walking.      Mobility Bed Mobility Overal bed mobility: Needs Assistance Bed Mobility: Supine to Sit, Sit to Supine     Supine to sit: Mod assist     General bed mobility comments: Assist required for LEs    Transfers                   General transfer comment: NT      Balance     Sitting balance-Leahy Scale: Fair Sitting balance - Comments: Posterior leaning with sitting at the EOB                                   ADL either performed or assessed with clinical judgement  ADL                                         General ADL Comments: MaxA LE ADLs     Vision Baseline Vision/History: 1 Wears glasses Patient Visual Report: No change from baseline       Perception         Praxis         Pertinent Vitals/Pain Pain Assessment Pain Score: 8  Pain Location: L foot/ankle Pain Descriptors / Indicators: Discomfort Pain Intervention(s): Limited activity within patient's tolerance, Monitored during session, Repositioned     Extremity/Trunk Assessment Upper  Extremity Assessment Upper Extremity Assessment: Generalized weakness (Left sided limitations2/2 hx of a shoulder injury sustained in a  recent fall.)   Lower Extremity Assessment Lower Extremity Assessment: LLE deficits/detail LLE Deficits / Details: swollen, red, edematous LLE: Unable to fully assess due to pain   Cervical / Trunk Assessment Cervical / Trunk Assessment: Kyphotic   Communication Communication Communication: No apparent difficulties Cueing Techniques: Verbal cues;Gestural cues   Cognition Arousal: Alert Behavior During Therapy: WFL for tasks assessed/performed Overall Cognitive Status: Within Functional Limits for tasks assessed                                       General Comments       Exercises     Shoulder Instructions      Home Living Family/patient expects to be discharged to:: Private residence Living Arrangements: Spouse/significant other Available Help at Discharge: Family Type of Home: House Home Access: Stairs to enter Secretary/administrator of Steps: 3 Entrance Stairs-Rails: Right Home Layout: One level     Bathroom Shower/Tub: Chief Strategy Officer: Standard Bathroom Accessibility: Yes   Home Equipment: Agricultural consultant (2 wheels)          Prior Functioning/Environment Prior Level of Function : Independent/Modified Independent             Mobility Comments: Used 2WW when walking around home prior to ankle injury. reliant on electric scooter since he has been home from his L ankle fracture. ADLs Comments: since s/s of cellulitis came on, been reliant on family assists witADL, IADLs, and transfers due to pain and weakness.        OT Problem List: Pain;Decreased strength;Decreased activity tolerance;Decreased knowledge of use of DME or AE      OT Treatment/Interventions: Self-care/ADL training;Therapeutic exercise;Therapeutic activities;DME and/or AE instruction;Patient/family education    OT  Goals(Current goals can be found in the care plan section) Acute Rehab OT Goals Patient Stated Goal: To get better OT Goal Formulation: With patient Time For Goal Achievement: 10/02/23 Potential to Achieve Goals: Good  OT Frequency: Min 1X/week    Co-evaluation              AM-PAC OT "6 Clicks" Daily Activity     Outcome Measure Help from another person eating meals?: None Help from another person taking care of personal grooming?: A Little Help from another person toileting, which includes using toliet, bedpan, or urinal?: A Lot Help from another person bathing (including washing, rinsing, drying)?: A Lot Help from another person to put on and taking off regular upper body clothing?: A Little Help from another person to put on and taking off regular lower body clothing?: A Lot  6 Click Score: 16   End of Session    Activity Tolerance: Patient limited by pain Patient left: in bed;with call bell/phone within reach;with bed alarm set;with family/visitor present  OT Visit Diagnosis: Muscle weakness (generalized) (M62.81);History of falling (Z91.81)                Time: 6387-5643 OT Time Calculation (min): 23 min Charges:  OT General Charges $OT Visit: 1 Visit OT Evaluation $OT Eval Moderate Complexity: 1 Mod  Olegario Messier, MS, OTR/L   Olegario Messier 09/18/2023, 4:17 PM

## 2023-09-18 NOTE — ED Provider Notes (Signed)
[facility] Provider Note    Event Date/Time   First MD Initiated Contact with Patient 09/18/23 [phone]     (approximate)   History   Ankle Pain   HPI  Ian Sanders is a 82 y.o. male who presents to the ED from home with a chief complaint of left ankle pain.  Patient was seen status post fall last week, diagnosed with left fibula fracture.  Podiatry was consulted who recommended cam boot.  Spouse was with patient last week and is concerned about his safety being discharged back home so physical therapy was consulted who cleared patient for discharge.  Patient followed up with his PCP several days ago who did not feel they could get him into an inpatient skilled nursing facility since physical therapy cleared patient to go home.  PCP did order wheelchair and home health.  Patient lives independently with his wife.  Since his fall last week, his daughter and daughter-in-law have both come from out of town to help with patient's care.  They both expressed extreme concern that he was allowed to be discharged home and states he is unable to ambulate with walker without heavy assistance.  Patient denies fever/chills, cough, chest pain, shortness of breath, abdominal pain, nausea, vomiting or diarrhea.  States he wanted to come due to ankle pain and the tramadol his PCP prescribed was not helping his pain.     Past Medical History   Past Medical History:  Diagnosis Date   Atrial fibrillation (HCC)    Basal cell carcinoma    Benign paroxysmal positional vertigo    Cancer (HCC)    History of chicken pox    History of measles    History of mumps    Hypertension    Mononeuritis of unspecified site    Other seborrheic keratosis    Stroke (HCC) 06/2020   Transient ischemic attack (TIA), and cerebral infarction without residual deficits(V12.54)    Traumatic subdural hematoma without loss of consciousness (HCC) 06/25/2020   Unspecified hypothyroidism      Active  Problem List   Patient Active Problem List   Diagnosis Date Noted   Balance problems 09/15/2023   Closed fracture of distal end of left fibula with routine healing 09/15/2023   Snoring 06/04/2022   CVA (cerebral vascular accident) (HCC) 07/13/2021   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   Alcohol abuse 12/23/2020   Other dyspnea and respiratory abnormality 12/23/2020   Heartburn 12/23/2020   Prostate cancer (HCC) 09/12/2020   Heart murmur, systolic 08/19/2020   Pancytopenia (HCC) 07/17/2020   Splenomegaly 07/17/2020   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   Bradycardia 06/11/2020   Heart palpitations 02/13/2020   Cirrhosis of liver without ascites (HCC) 09/16/2019   Iron deficiency anemia due to chronic blood loss 09/16/2019   Chronic anticoagulation 09/16/2019   History of prostate cancer 09/16/2019   Thrombocytopenia (HCC) 06/05/2019   History of adenomatous polyp of colon 05/02/2018   Paroxysmal atrial fibrillation (HCC) 07/14/2017   Cornu cutaneum 06/29/2016   Laceration of ankle 06/29/2016   Low back pain 06/29/2016   Aortic atherosclerosis (HCC) 05/05/2016   Fatty liver 05/05/2016   Depression 04/17/2016   Regular alcohol consumption 04/17/2016   Neuropathy 04/16/2016   Hernia, inguinal, right 04/16/2016   Melena 04/16/2016   Elevated liver function tests 04/16/2016   Tobacco abuse 04/16/2016   Urge incontinence 07/17/2015   Incontinence 06/20/2015   Prediabetes 12/19/2013   Groin pain 02/21/2013   Hypertriglyceridemia  12/14/2011   HTN (hypertension) 12/14/2011   Sacroiliac sprain 04/28/2010   History of CVA (cerebrovascular accident) 03/21/2010   Headache 03/10/2010   Benign paroxysmal positional vertigo 01/15/2010   Insomnia 05/09/2009   Allergic rhinitis 05/09/2009   Esophageal reflux 05/09/2009   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/2009     Past Surgical History   Past Surgical History:  Procedure Laterality Date   Carotid Doppler Ultrasound  04/02/2010    Small amount Calcified plaque bilaterally, no significant stenosis.   CATARACT EXTRACTION W/PHACO Right 04/15/2021   Procedure: CATARACT EXTRACTION PHACO AND INTRAOCULAR LENS PLACEMENT (IOC) RIGHT;  Surgeon: Galen Manila, MD;  Location: [facility];  Service: Ophthalmology;  Laterality: Right;  7.98 0:47.2   CATARACT EXTRACTION W/PHACO Left 05/20/2021   Procedure: CATARACT EXTRACTION PHACO AND INTRAOCULAR LENS PLACEMENT (IOC) LEFT 3.37 00:32.0;  Surgeon: Galen Manila, MD;  Location: [facility];  Service: Ophthalmology;  Laterality: Left;  Requests arrival 10A or after   COLONOSCOPY  05/11/2014   Tubular Adenoma. Dr. Servando Snare   COLONOSCOPY WITH PROPOFOL N/A 05/23/2018   Procedure: COLONOSCOPY WITH PROPOFOL;  Surgeon: Wyline Mood, MD;  Location: [facility] END[facility];  Service: Gastroenterology;  Laterality: N/A;   DOPPLER ECHOCARDIOGRAPHY  04/02/2010   Mild left atrial dilation. Normal right atrium. No valvular disease. No thrombus. Normal LV function. EF>55%   ESOPHAGOGASTRODUODENOSCOPY (EGD) WITH PROPOFOL N/A 08/16/2019   Procedure: ESOPHAGOGASTRODUODENOSCOPY (EGD) WITH PROPOFOL;  Surgeon: Wyline Mood, MD;  Location: [facility] ENDOSCOPY;  Service: Gastroenterology;  Laterality: N/A;   FINGER AMPUTATION     HERNIA REPAIR Right    MRI Brain with and without contrast  03/20/2010   Suggestive of basal ganglia lacunar infarct   PROSTATECTOMY  11/16/1993   Abdominal   SKIN SURGERY     multiple   Thumb surgery       Home Medications   Prior to Admission medications   Medication Sig Start Date End Date Taking? Authorizing Provider  calcium-vitamin D (OSCAL) 250-125 MG-UNIT per tablet Take 1 tablet by mouth 2 (two) times daily.     [provider]  chlorthalidone (HYGROTON) 25 MG tablet Take 1 tablet (25 mg total) by mouth daily. Take 25 mg by mouth daily. 08/04/23   Malva Limes, MD  cyanocobalamin 1000 MCG tablet Take 1 tablet by mouth daily. 04/24/15   [provider]  cyclobenzaprine (FLEXERIL) 10 MG tablet Take 0.5 tablets (5 mg total) by mouth 2 (two) times daily as needed. 05/07/23   Malva Limes, MD  diltiazem (CARDIZEM CD) 120 MG 24 hr capsule Take 120 mg by mouth daily. 06/09/22   [provider]  FA-B6-B12-D-Omega 3-Phytoster (ANIMI-3) 1 MG CAPS Take 1 capsule by mouth 1 day or 1 dose. 04/24/22   [provider]  Ferrous Sulfate 134 MG TABS Take 1 tablet by mouth daily.    [provider]  flecainide (TAMBOCOR) 50 MG tablet Take 1 tablet (50 mg total) by mouth 2 (two) times daily. 07/15/17   Antonieta Iba, MD  gabapentin (NEURONTIN) 100 MG capsule Take one in the morning, one in the afternoon, and three at night Patient taking differently: Take 2  in the AM, 3 @ bedtime 06/15/23   Malva Limes, MD  Leuprolide Acetate, 6 Month, (LUPRON) 45 MG injection Inject 45 mg into the muscle every 6 (six) months.    [provider]  losartan (COZAAR) 25 MG tablet Take 1 tablet by mouth 2 (two) times daily.    [provider]  meclizine (ANTIVERT) 25 MG tablet Take 1 tablet by mouth daily.    [provider]  omeprazole (PRILOSEC) 40 MG capsule Take 40 mg by mouth daily. 09/02/23   [provider]  oxyCODONE-acetaminophen (PERCOCET) 5-325 MG tablet Take 1 tablet by mouth every 4 (four) hours as needed for severe pain (pain score 7-10). 09/11/23   Minna Antis, MD  polyvinyl alcohol (LIQUIFILM TEARS) 1.4 % ophthalmic solution 1 drop as needed.    [provider]  propranolol (INDERAL) 10 MG tablet Take 1 tablet by mouth 2 (two) times daily. 09/02/23 09/01/24  [provider]  traMADol (ULTRAM) 50 MG tablet Take 1 tablet (50 mg total) by mouth every 6 (six) hours as needed. 04/06/23   Malva Limes, MD  trospium (SANCTURA) 20 MG tablet Take 1 tablet (20 mg total) by mouth 2 (two) times daily. 09/08/23   Stoioff, Verna Czech, MD  Vibegron (GEMTESA) 75 MG TABS Take 1  tablet (75 mg total) by mouth daily. 08/02/23   Vaillancourt, Lelon Mast, PA-C  zolpidem (AMBIEN) 10 MG tablet TAKE ONE-HALF TO ONE TABLET AT BEDTIME. 09/15/23   Pardue, Monico Blitz, DO     Allergies  Lisinopril, Myrbetriq [mirabegron], Beta adrenergic blockers, Other, Triamterene, Amlodipine, Atorvastatin, Dabigatran etexilate mesylate, Dabigatran etexilate mesylate, and Latex   Family History   Family History  Problem Relation Age of Onset   Heart disease Mother 42       CABG   Hypertension Mother    Heart attack Mother    Hypertension Brother    Heart disease Sister        stents   Prostate cancer Brother    Kidney cancer Brother    Skin cancer Brother    Hypertension Brother    Bladder Cancer Sister    Leukemia Father    Stroke Father        multiple   Cerebral aneurysm Sister    Kidney cancer Brother      Physical Exam  Triage Vital Signs: ED Triage Vitals  Encounter Vitals Group     BP 09/18/23 0003 (!) 120/57     Systolic BP Percentile --      Diastolic BP Percentile --      Pulse Rate 09/18/23 0003 65     Resp --      Temp 09/18/23 0003 98.4 F (36.9 C)     Temp Source 09/18/23 0003 Oral     SpO2 09/18/23 0003 96 %     Weight --      Height --      Head Circumference --      Peak Flow --      Pain Score 09/18/23 0010 8     Pain Loc --      Pain Education --      Exclude from Growth Chart --     Updated Vital Signs: BP (!) 128/53   Pulse 66   Temp 98.4 F (36.9 C) (Oral)   Resp 20   SpO2 92%    General: Awake, mild distress.  CV:  RRR.  Good peripheral perfusion.  Resp:  Normal effort.  CTAB. Abd:  Nontender.  No distention.  Other:  Pale, cachectic.  Left foot in walking boot.   ED Results / Procedures / Treatments  Labs (all labs ordered are listed, but only abnormal results are displayed) Labs Reviewed  CBC WITH DIFFERENTIAL/PLATELET - Abnormal; Notable for the following components:  Result Value   Hemoglobin 11.2 (*)    HCT 33.8  (*)    Platelets 95 (*)    Lymphs Abs 0.6 (*)    All other components within normal limits  COMPREHENSIVE METABOLIC PANEL - Abnormal; Notable for the following components:   Sodium 130 (*)    Potassium 3.1 (*)    Chloride 93 (*)    Glucose, Bld 118 (*)    Calcium 8.6 (*)    Total Bilirubin 2.0 (*)    All other components within normal limits  RESP PANEL BY RT-PCR (RSV, FLU A&B, COVID)  RVPGX2  URINALYSIS, ROUTINE W REFLEX MICROSCOPIC  TROPONIN I (HIGH SENSITIVITY)     EKG  ED ECG REPORT I, Kord Monette J, the attending physician, personally viewed and interpreted this ECG.   Date: 09/18/2023  EKG Time: 0319  Rate: 61  Rhythm: normal sinus rhythm  Axis: Normal  Intervals:none  ST&T Change: Nonspecific    RADIOLOGY I have independently visualized and interpreted patient's imaging study as well as noted the radiology interpretation:  Chest x-ray: Bibasilar atelectasis, mild elevation of right hemidiaphragm, progressive since prior examination  Official radiology report(s): DG Chest Port 1 View  Result Date: 09/18/2023 CLINICAL DATA:  Weakness, fever EXAM: PORTABLE CHEST 1 VIEW COMPARISON:  07/13/2021 FINDINGS: Mild elevation of the right hemidiaphragm, progressive since prior examination. Bibasilar atelectasis. No confluent pulmonary infiltrate. No pneumothorax or pleural effusion. Cardiac size within normal limits. Pulmonary vascularity is normal. No acute bone abnormality. IMPRESSION: 1. Mild elevation of the right hemidiaphragm, progressive since prior examination. Bibasilar atelectasis. Electronically Signed   By: Helyn Numbers M.D.   On: 09/18/2023 03:36     PROCEDURES:  Critical Care performed: No  Procedures   MEDICATIONS ORDERED IN ED: Medications  potassium chloride SA (KLOR-CON M) CR tablet 40 mEq (has no administration in time range)  oxyCODONE-acetaminophen (PERCOCET/ROXICET) 5-325 MG per tablet 1 tablet (has no administration in time range)  cefTRIAXone  (ROCEPHIN) 1 g in sodium chloride 0.9 % 100 mL IVPB (has no administration in time range)  oxyCODONE-acetaminophen (PERCOCET/ROXICET) 5-325 MG per tablet 1 tablet (1 tablet Oral Given 09/18/23 0121)  sodium chloride 0.9 % bolus 500 mL (0 mLs Intravenous Stopped 09/18/23 0458)  oxyCODONE-acetaminophen (PERCOCET/ROXICET) 5-325 MG per tablet 1 tablet (1 tablet Oral Given 09/18/23 0506)     IMPRESSION / MDM / ASSESSMENT AND PLAN / ED COURSE  I reviewed the triage vital signs and the nursing notes.                             82 year old male brought by family members for pain due to recent ankle fracture but chiefly due to concerns of him returning home for safety reasons as patient is not able to ambulate without heavy assistance.  Will obtain medical workup to include cardiac panel, EKG, UA.  If patient does not meet admission criteria, will obtain social work consult.  I personally reviewed patient's ED visit from 09/11/2023 and his PCP office visit from 09/15/2023.  Patient's presentation is most consistent with acute complicated illness / injury requiring diagnostic workup.   FINAL CLINICAL IMPRESSION(S) / ED DIAGNOSES   Final diagnoses:  Generalized weakness  Recurrent falls  Hypokalemia  Hyponatremia  Cellulitis of left lower extremity     Rx / DC Orders   ED Discharge Orders     None        Note:  This document was prepared  using Conservation officer, historic buildings and may include unintentional dictation errors.   Irean Hong, MD 09/18/23 [phone]

## 2023-09-18 NOTE — ED Notes (Signed)
Patient given a breakfast tray.

## 2023-09-18 NOTE — Evaluation (Signed)
Physical Therapy Evaluation Patient Details Name: Ian Sanders MRN: [id number] DOB: 1941-10-21 Today's Date: 09/18/2023  History of Present Illness  Ian Sanders is a 82 y.o. male with medical history significant of PAF not on anticoagulation, HTN, TIA stroke, cirrhosis with chronic thrombocytopenia, presented with worsening of left leg rash and pain and generalized weakness     Patient sustained a mechanical fall 7 days ago, and broke left ankle and came to ED.  X-ray showed distal fibula fracture in the case was reviewed by podiatry Dr. Allena Sanders and patient was considered to be stable and sent home with cam boot and ambulating on a walker.  Gradually patient started noticed a new onset rash of the left foot and extends to mid shin in the last 2 days associated with pain.  No fever or chills.  Meantime patient fell even more weaker, also patient had a low-grade fever 100.12 nights ago.   Clinical Impression  Pt admitted with above diagnosis. Pt currently with functional limitations due to the deficits listed below (see PT Problem List). Pt received upright in bathroom agreeable to PT. Daughter present. Per pt and daughter, pt was doing well after prior d/c due to L ankle fx until cellulitis symptoms began this past Monday. Then pt relied heavily on family support to ambulate and transfer to electric scooter and toielting/hygiene tasks.   To date, session greatly limited as pt does not have personal CAM boot in room. Thus PT assessing STS and gait to recliner since pt was received on the toilet. Pt reliant on CGA for STS and use of rail and dependent for PT to don underwear in standing as pt needs support of RW. Complete 10' of step through gait at supervision level to recliner. Pt seems to mobilize well however pt reports with CAM boot donned it is too heavy to safely transfer and walk here recently. PT to defer further mobilization due to CAM boot and ensure PT treatment tomorrow with CAM boot present  to assess functional mobility for more accurate d/c recs. Will plan for home with home services needing 24/7 support. If this is not possible, PT to heavily recommend STR at discharge for safe gait and transfer training prior to d/c home. Will plan to recommend hospital bed in anticipation for d/c home. Daughter and pt understanding of education. Medical team updated.       If plan is discharge home, recommend the following: A lot of help with walking and/or transfers;Assist for transportation;Help with stairs or ramp for entrance   Can travel by private vehicle        Equipment Recommendations Hospital bed  Recommendations for Other Services       Functional Status Assessment Patient has had a recent decline in their functional status and demonstrates the ability to make significant improvements in function in a reasonable and predictable amount of time.     Precautions / Restrictions Precautions Precautions: Fall Restrictions Weight Bearing Restrictions: Yes LLE Weight Bearing: Weight bearing as tolerated Other Position/Activity Restrictions: Use of cam boot on left foot while walking.      Mobility  Bed Mobility               General bed mobility comments: NT Patient Response: Cooperative  Transfers Overall transfer level: Needs assistance   Transfers: Sit to/from Stand Sit to Stand: Contact guard assist           General transfer comment: from toilet    Ambulation/Gait Ambulation/Gait assistance: Supervision  Gait Distance (Feet): 10 Feet Assistive device: Rolling walker (2 wheels) Gait Pattern/deviations: Step-to pattern       General Gait Details: Deferred further gait/transfers as pt does not have CAM boot present. Pt declining transfer attemps due to L foot pain.  Stairs            Wheelchair Mobility     Tilt Bed Tilt Bed Patient Response: Cooperative  Modified Rankin (Stroke Patients Only)       Balance Overall balance  assessment: Needs assistance Sitting-balance support: No upper extremity supported, Feet supported Sitting balance-Leahy Scale: Good     Standing balance support: Bilateral upper extremity supported, Reliant on assistive device for balance, During functional activity Standing balance-Leahy Scale: Poor Standing balance comment: reliant on BUE support on RW. Needed full assist to don underwear in standing                             Pertinent Vitals/Pain Pain Assessment Pain Assessment: Faces Faces Pain Scale: Hurts little more Pain Location: L foot/ankle Pain Descriptors / Indicators: Discomfort Pain Intervention(s): Limited activity within patient's tolerance, Monitored during session, Repositioned    Home Living Family/patient expects to be discharged to:: Private residence Living Arrangements: Spouse/significant other Available Help at Discharge: Family Type of Home: House Home Access: Stairs to enter Entrance Stairs-Rails: Right Entrance Stairs-Number of Steps: 3   Home Layout: One level Home Equipment: Agricultural consultant (2 wheels)      Prior Function Prior Level of Function : Independent/Modified Independent             Mobility Comments: Used 2WW when walking around home prior to ankle injury. reliant on electric scooter since he has been home from his L ankle fracture. ADLs Comments: since s/s of cellulitis came on, been reliant on family assis with transfers due to pain and weakness.     Extremity/Trunk Assessment   Upper Extremity Assessment Upper Extremity Assessment: Generalized weakness;Defer to OT evaluation    Lower Extremity Assessment Lower Extremity Assessment: LLE deficits/detail LLE Deficits / Details: swollen, red, edematous LLE: Unable to fully assess due to pain    Cervical / Trunk Assessment Cervical / Trunk Assessment: Kyphotic  Communication   Communication Communication: No apparent difficulties Cueing Techniques: Verbal  cues;Gestural cues  Cognition Arousal: Alert Behavior During Therapy: WFL for tasks assessed/performed Overall Cognitive Status: Within Functional Limits for tasks assessed                                          General Comments      Exercises Other Exercises Other Exercises: Reviewed PT role, WB status with CAM boot, limitations in eval due to absence of boot and possible equipment needs   Assessment/Plan    PT Assessment Patient needs continued PT services  PT Problem List Decreased strength;Decreased activity tolerance;Decreased balance;Decreased mobility;Decreased safety awareness       PT Treatment Interventions DME instruction;Gait training;Balance training;Stair training;Functional mobility training;Therapeutic activities;Therapeutic exercise;Patient/family education    PT Goals (Current goals can be found in the Care Plan section)  Acute Rehab PT Goals Patient Stated Goal: to go home PT Goal Formulation: With patient Time For Goal Achievement: 10/02/23 Potential to Achieve Goals: Fair    Frequency Min 1X/week     Co-evaluation  AM-PAC PT "6 Clicks" Mobility  Outcome Measure Help needed turning from your back to your side while in a flat bed without using bedrails?: A Little Help needed moving from lying on your back to sitting on the side of a flat bed without using bedrails?: A Little Help needed moving to and from a bed to a chair (including a wheelchair)?: A Little Help needed standing up from a chair using your arms (e.g., wheelchair or bedside chair)?: A Lot Help needed to walk in hospital room?: A Little Help needed climbing 3-5 steps with a railing? : A Lot 6 Click Score: 16    End of Session Equipment Utilized During Treatment: Gait belt Activity Tolerance: Other (comment);Patient limited by pain (absence of cam boot) Patient left: with family/visitor present;with chair alarm set Nurse Communication: Mobility  status PT Visit Diagnosis: Other abnormalities of gait and mobility (R26.89);Muscle weakness (generalized) (M62.81);History of falling (Z91.81);Pain Pain - Right/Left: Left Pain - part of body: Ankle and joints of foot    Time: 1610-9604 PT Time Calculation (min) (ACUTE ONLY): 26 min   Charges:   PT Evaluation $PT Eval Moderate Complexity: 1 Mod PT Treatments $Therapeutic Activity: 8-22 mins PT General Charges $$ ACUTE PT VISIT: 1 Visit       Delphia Grates. Fairly IV, PT, DPT Physical Therapist- [place]  [facility]  09/18/2023, 12:55 PM

## 2023-09-18 NOTE — ED Triage Notes (Signed)
Pt reports fracture of left ankle on Saturday.  Was doing well with this until Thursday when pain increased.  Saw the orthopedist on Thurs and was given a script for tramadol and reports this has not helped his pain. Does not feel that it is wrapped too tightly.  Has good cap refill in his toes.  Pt was told by ortho if pain became to much to come to the ED>

## 2023-09-19 DIAGNOSIS — S82892A Other fracture of left lower leg, initial encounter for closed fracture: Secondary | ICD-10-CM

## 2023-09-19 DIAGNOSIS — I10 Essential (primary) hypertension: Secondary | ICD-10-CM

## 2023-09-19 DIAGNOSIS — E876 Hypokalemia: Secondary | ICD-10-CM | POA: Diagnosis not present

## 2023-09-19 DIAGNOSIS — E871 Hypo-osmolality and hyponatremia: Secondary | ICD-10-CM | POA: Diagnosis not present

## 2023-09-19 DIAGNOSIS — R262 Difficulty in walking, not elsewhere classified: Secondary | ICD-10-CM

## 2023-09-19 DIAGNOSIS — L03116 Cellulitis of left lower limb: Secondary | ICD-10-CM | POA: Diagnosis not present

## 2023-09-19 MED ORDER — MORPHINE SULFATE (PF) 2 MG/ML IV SOLN
2.0000 mg | Freq: Once | INTRAVENOUS | Status: AC
  Administered 2023-09-19: 2 mg via INTRAVENOUS
  Filled 2023-09-19: qty 1

## 2023-09-19 NOTE — [place] FL2 (Signed)
[place] MEDICAID FL2 LEVEL OF CARE FORM     IDENTIFICATION  Patient Name: Ian Sanders Birthdate: 11/02/1941 Sex: male Admission Date (Current Location): 09/18/2023  [place] and [place] Number:  Chiropodist and Address:  [facility], [address], [place], [place] [address]      Provider Number: [id number]  Attending Physician Name and Address:  Marcelino Duster, MD  Relative Name and Phone Number:  Taevin, Mcferran (Spouse)  [phone]    Current Level of Care: Hospital Recommended Level of Care: Skilled Nursing Facility Prior Approval Number:    Date Approved/Denied:   PASRR Number: [id number]  Discharge Plan: SNF    Current Diagnoses: Patient Active Problem List   Diagnosis Date Noted   Cellulitis of left foot 09/18/2023   Cellulitis 09/18/2023   Balance problems 09/15/2023   Closed fracture of distal end of left fibula with routine healing 09/15/2023   Snoring 06/04/2022   CVA (cerebral vascular accident) (HCC) 07/13/2021   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   Alcohol abuse 12/23/2020   Other dyspnea and respiratory abnormality 12/23/2020   Heartburn 12/23/2020   Prostate cancer (HCC) 09/12/2020   Heart murmur, systolic 08/19/2020   Pancytopenia (HCC) 07/17/2020   Splenomegaly 07/17/2020   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   Bradycardia 06/11/2020   Heart palpitations 02/13/2020   Cirrhosis of liver without ascites (HCC) 09/16/2019   Iron deficiency anemia due to chronic blood loss 09/16/2019   Chronic anticoagulation 09/16/2019   History of prostate cancer 09/16/2019   Thrombocytopenia (HCC) 06/05/2019   History of adenomatous polyp of colon 05/02/2018   Paroxysmal atrial fibrillation (HCC) 07/14/2017   Cornu cutaneum 06/29/2016   Laceration of ankle 06/29/2016   Low back pain 06/29/2016   Aortic atherosclerosis (HCC) 05/05/2016   Fatty liver 05/05/2016   Depression 04/17/2016   Regular alcohol  consumption 04/17/2016   Neuropathy 04/16/2016   Hernia, inguinal, right 04/16/2016   Melena 04/16/2016   Elevated liver function tests 04/16/2016   Tobacco abuse 04/16/2016   Urge incontinence 07/17/2015   Incontinence 06/20/2015   Prediabetes 12/19/2013   Groin pain 02/21/2013   Hypertriglyceridemia 12/14/2011   HTN (hypertension) 12/14/2011   Sacroiliac sprain 04/28/2010   History of CVA (cerebrovascular accident) 03/21/2010   Headache 03/10/2010   Benign paroxysmal positional vertigo 01/15/2010   Insomnia 05/09/2009   Allergic rhinitis 05/09/2009   Esophageal reflux 05/09/2009   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/2009    Orientation RESPIRATION BLADDER Height & Weight     Self, Time, Situation  Normal Continent Weight:   Height:     BEHAVIORAL SYMPTOMS/MOOD NEUROLOGICAL BOWEL NUTRITION STATUS     (NONE) Continent Diet  AMBULATORY STATUS COMMUNICATION OF NEEDS Skin   Extensive Assist Verbally Other (Comment) (Cellulities left leg)                       Personal Care Assistance Level of Assistance  Bathing, Feeding, Dressing Bathing Assistance: Limited assistance Feeding assistance: Independent Dressing Assistance: Limited assistance     Functional Limitations Info  Sight, Hearing, Speech Sight Info: Adequate Hearing Info: Adequate Speech Info: Adequate    SPECIAL CARE FACTORS FREQUENCY  PT (By licensed PT), OT (By licensed OT)     PT Frequency: 5x week OT Frequency: 5x week            Contractures Contractures Info: Not present    Additional Factors Info  Code Status, Allergies Code Status Info: Full Allergies Info: Lisinopril,  Mirabegron, Dabigatran, Amlodipine, Triamterene, Atrovastatin Blockers, Atexilate Mesylate, Latex           Current Medications (09/19/2023):  This is the current hospital active medication list Current Facility-Administered Medications  Medication Dose Route Frequency Provider Last Rate Last Admin   bisacodyl (DULCOLAX) EC  tablet 5 mg  5 mg Oral Daily PRN Mikey College T, MD       cefTRIAXone (ROCEPHIN) 1 g in sodium chloride 0.9 % 100 mL IVPB  1 g Intravenous Q24H Mikey College T, MD 200 mL/hr at 09/19/23 0930 1 g at 09/19/23 0930   cyclobenzaprine (FLEXERIL) tablet 5 mg  5 mg Oral TID PRN Mikey College T, MD   5 mg at 09/19/23 0030   diltiazem (CARDIZEM CD) 24 hr capsule 120 mg  120 mg Oral Daily Mikey College T, MD   120 mg at 09/19/23 0924   enoxaparin (LOVENOX) injection 40 mg  40 mg Subcutaneous Q24H Mikey College T, MD       fesoterodine (TOVIAZ) tablet 4 mg  4 mg Oral Daily Mikey College T, MD   4 mg at 09/19/23 0925   flecainide (TAMBOCOR) tablet 50 mg  50 mg Oral BID Mikey College T, MD   50 mg at 09/19/23 6387   gabapentin (NEURONTIN) capsule 100 mg  100 mg Oral BID Mikey College T, MD   100 mg at 09/19/23 1508   gabapentin (NEURONTIN) capsule 300 mg  300 mg Oral QHS Mikey College T, MD   300 mg at 09/18/23 2150   losartan (COZAAR) tablet 25 mg  25 mg Oral BID Mikey College T, MD   25 mg at 09/19/23 5643   meclizine (ANTIVERT) tablet 25 mg  25 mg Oral Daily Mikey College T, MD       oxyCODONE-acetaminophen (PERCOCET/ROXICET) 5-325 MG per tablet 1 tablet  1 tablet Oral Q4H PRN Mikey College T, MD   1 tablet at 09/19/23 1136   pantoprazole (PROTONIX) EC tablet 40 mg  40 mg Oral Daily Mikey College T, MD   40 mg at 09/19/23 3295   polyvinyl alcohol (LIQUIFILM TEARS) 1.4 % ophthalmic solution 1 drop  1 drop Both Eyes PRN Mikey College T, MD       propranolol (INDERAL) tablet 10 mg  10 mg Oral BID Mikey College T, MD   10 mg at 09/19/23 1884   senna-docusate (Senokot-S) tablet 1 tablet  1 tablet Oral QHS PRN Emeline General, MD       zolpidem Remus Loffler) tablet 5 mg  5 mg Oral QHS PRN Emeline General, MD   5 mg at 09/19/23 0030     Discharge Medications: Please see discharge summary for a list of discharge medications.  Relevant Imaging Results:  Relevant Lab Results:   Additional Information SS# [id number]  Hetty Ely,  RN

## 2023-09-19 NOTE — Plan of Care (Signed)

## 2023-09-19 NOTE — Progress Notes (Signed)
Physical Therapy Treatment Patient Details Name: Ian Sanders MRN: [id number] DOB: 01/31/1941 Today's Date: 09/19/2023   History of Present Illness Pt. is a 82 y.o. male with medical history significant of PAF not on anticoagulation, HTN, TIA stroke, cirrhosis with chronic thrombocytopenia, presented with worsening of left leg rash and pain and generalized weakness. Patient sustained a mechanical fall 7 days ago, and broke left ankle and came to ED.  X-ray showed distal fibula fracture in the case was reviewed by podiatry Dr. Allena Katz and patient was considered to be stable and sent home with cam boot and ambulating on a walker.  Gradually patient started noticed a new onset rash of the left foot and extends to mid shin in the last 2 days associated with pain.  No fever or chills.  Meantime patient fell even more weaker, also patient had a low-grade fever 100.12 nights ago.    PT Comments  Pt received in bed agreeable to therapy interventions. Pt's CAM boot by his bedside noted. Pt participated in Supine to sit with HOB elevated to 80 deg with min assist , transferred st ST from reg height bed with Mod to min of 1, min assist Bed to chair and Min assist to walk to the toilet bowl and Back. Pt donned CAM boot to LLE through out the WB activities. Pt's needs one person assistant for all activities due to increased pain and decreased balance due to weakness L>R and pain. Pt will benefit form cont PT in acute and after acute. Pt referred to Mobility specialist to optimize rehab potential.    If plan is discharge home, recommend the following: A little help with walking and/or transfers;A lot of help with bathing/dressing/bathroom;Assistance with cooking/housework;Assist for transportation;Help with stairs or ramp for entrance   Can travel by private vehicle     No  Equipment Recommendations  Hospital bed    Recommendations for Other Services       Precautions / Restrictions  Precautions Precautions: Fall Restrictions Weight Bearing Restrictions: Yes LLE Weight Bearing: Weight bearing as tolerated Other Position/Activity Restrictions: Use of cam boot on left foot while walking.     Mobility  Bed Mobility Overal bed mobility: Needs Assistance Bed Mobility: Supine to Sit, Sit to Supine     Supine to sit: Contact guard, HOB elevated Sit to supine: Mod assist (from regualr height bed)   General bed mobility comments: Assist required for LEs    Transfers Overall transfer level: Needs assistance Equipment used: Rolling walker (2 wheels) Transfers: Sit to/from Stand, Bed to chair/wheelchair/BSC Sit to Stand: Min assist Stand pivot transfers: Min assist         General transfer comment: with CAM boot donned.    Ambulation/Gait Ambulation/Gait assistance: Min assist Gait Distance (Feet): 20 Feet Assistive device: Rolling walker (2 wheels) Gait Pattern/deviations: Step-to pattern Gait velocity: dec     General Gait Details: slw with decreased loading repsonse nad step length   Stairs             Wheelchair Mobility     Tilt Bed    Modified Rankin (Stroke Patients Only)       Balance Overall balance assessment: Needs assistance Sitting-balance support: Bilateral upper extremity supported, Feet supported Sitting balance-Leahy Scale: Good     Standing balance support: Bilateral upper extremity supported Standing balance-Leahy Scale: Fair Standing balance comment: reliant on BUE support on RW. Needed full assist to don underwear in standing  Cognition Arousal: Alert Behavior During Therapy: WFL for tasks assessed/performed Overall Cognitive Status: Within Functional Limits for tasks assessed                                          Exercises General Exercises - Lower Extremity Ankle Circles/Pumps: AROM, 10 reps, Seated, Both Heel Slides: AROM, Strengthening, Both, 10  reps, Seated Straight Leg Raises: AROM, Both, Strengthening, 10 reps, Seated Other Exercises Other Exercises: bridges 1 x 10 reps in seated in reclined position. 10 rpes    General Comments        Pertinent Vitals/Pain Pain Assessment Pain Assessment: 0-10 Pain Score: 7  Pain Location: L foot/ankle Pain Descriptors / Indicators: Discomfort Pain Intervention(s): Limited activity within patient's tolerance    Home Living                          Prior Function            PT Goals (current goals can now be found in the care plan section) Acute Rehab PT Goals Patient Stated Goal: to go home PT Goal Formulation: With patient Time For Goal Achievement: 10/02/23 Potential to Achieve Goals: Good Progress towards PT goals: Progressing toward goals    Frequency    Min 1X/week      PT Plan      Co-evaluation              AM-PAC PT "6 Clicks" Mobility   Outcome Measure  Help needed turning from your back to your side while in a flat bed without using bedrails?: A Little Help needed moving from lying on your back to sitting on the side of a flat bed without using bedrails?: A Little Help needed moving to and from a bed to a chair (including a wheelchair)?: A Little Help needed standing up from a chair using your arms (e.g., wheelchair or bedside chair)?: A Lot Help needed to walk in hospital room?: A Lot Help needed climbing 3-5 steps with a railing? : Total 6 Click Score: 14    End of Session Equipment Utilized During Treatment: Gait belt Activity Tolerance: Other (comment);Patient limited by pain Patient left: with family/visitor present;with chair alarm set Nurse Communication: Mobility status PT Visit Diagnosis: Other abnormalities of gait and mobility (R26.89);Muscle weakness (generalized) (M62.81);History of falling (Z91.81);Pain Pain - Right/Left: Left Pain - part of body: Ankle and joints of foot     Time: 1610-9604 PT Time Calculation  (min) (ACUTE ONLY): 42 min  Charges:    $Gait Training: 8-22 mins $Therapeutic Exercise: 23-37 mins $Therapeutic Activity: 23-37 mins PT General Charges $$ ACUTE PT VISIT: 1 Visit                     Janet Berlin PT DPT 12:41 PM,09/19/23

## 2023-09-19 NOTE — Progress Notes (Signed)
Progress Note   Patient: Ian Sanders:[id number] DOB: 1941/02/02 DOA: 09/18/2023     1 DOS: the patient was seen and examined on 09/19/2023   Brief hospital course: Ian Sanders is a 82 y.o. male with medical history significant of PAF not on anticoagulation, HTN, TIA stroke, cirrhosis with chronic thrombocytopenia, presented with worsening of left leg rash and pain and generalized weakness   Assessment and Plan: Left leg cellulitis Redness improving. Continue ceftriaxone MRSA screening negative, strep antigen negative. DVT study negative. Continue pain control.   Left ankle fracture and acute on chronic ambulation dysfunction Continue cam boot and walker. PT worked with him, off balance, pain interferes with ambulation, recommended SNF.   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 Trend sodium.   Hypokalemia Recheck and replace accordingly.   PAF Currently sinus rhythm. Continue Cardizem and flecainide Not on anticoagulations for unknown reason   HTN Continue Cardizem and losartan   Chronic neuropathy Continue gabapentin He is on percecet which is continued for pain.   Out of bed to chair. Incentive spirometry. Nursing supportive care. Fall, aspiration precautions. DVT prophylaxis   Code Status: Full Code  Subjective: Patient is seen and examined today morning. He is lying comfortably. Left foot redness noted. He does have pain with movement. Did not get out of bed. Advised to work with PT.  Physical Exam: Vitals:   09/18/23 0823 09/18/23 1531 09/18/23 2324 09/19/23 0757  BP: (!) 155/61 (!) 123/58 (!) 121/54 (!) 110/46  Pulse: (!) 58 (!) 59 60 (!) 55  Resp: 14 14 15 14   Temp: 98.3 F (36.8 C) 97.9 F (36.6 C) 97.9 F (36.6 C) 98.2 F (36.8 C)  TempSrc:  Oral Oral Oral  SpO2: 98% 97% 93% 92%    General - Elderly Caucasian male, no apparent distress HEENT - PERRLA, EOMI, atraumatic head, non tender  sinuses. Lung - Clear, no rales, rhonchi, wheezes. Heart - S1, S2 heard, no murmurs, rubs, left> right pedal edema. Abdomen - Soft, non tender, bowel sounds good Neuro - Alert, awake and oriented x 3, non focal exam. Skin - Warm and dry. Left foot redness, warmth, swelling  Data Reviewed:      Latest Ref Rng & Units 09/18/2023    3:18 AM 04/09/2023    3:09 PM 03/30/2022    2:00 PM  CBC  WBC 4.0 - 10.5 K/uL 4.9  2.8  2.2   Hemoglobin 13.0 - 17.0 g/dL 81.1  91.4  78.2   Hematocrit 39.0 - 52.0 % 33.8  36.2  36.9   Platelets 150 - 400 K/uL 95  56  56       Latest Ref Rng & Units 09/18/2023    3:18 AM 04/09/2023    3:09 PM 03/30/2022    2:00 PM  BMP  Glucose 70 - 99 mg/dL 956  213  086   BUN 8 - 23 mg/dL 15  12  9    Creatinine 0.61 - 1.24 mg/dL 5.78  4.69  6.29   Sodium 135 - 145 mmol/L 130  133  130   Potassium 3.5 - 5.1 mmol/L 3.1  3.8  3.8   Chloride 98 - 111 mmol/L 93  96  95   CO2 22 - 32 mmol/L 28  27  29    Calcium 8.9 - 10.3 mg/dL 8.6  9.0  8.8    US Venous Img Lower Bilateral (DVT)  Result Date: 09/18/2023 CLINICAL DATA:  52841 Swelling 32440 EXAM: BILATERAL LOWER  EXTREMITY VENOUS DOPPLER ULTRASOUND TECHNIQUE: Gray-scale sonography with graded compression, as well as color Doppler and duplex ultrasound were performed to evaluate the lower extremity deep venous systems from the level of the common femoral vein and including the common femoral, femoral, profunda femoral, popliteal and calf veins including the posterior tibial, peroneal and gastrocnemius veins when visible. Spectral Doppler was utilized to evaluate flow at rest and with distal augmentation maneuvers in the common femoral, femoral and popliteal veins. COMPARISON:  None Available. FINDINGS: RIGHT LOWER EXTREMITY Common Femoral Vein: No evidence of thrombus. Normal compressibility, respiratory phasicity and response to augmentation. Saphenofemoral Junction: No evidence of thrombus. Normal compressibility and flow on  color Doppler imaging. Profunda Femoral Vein: No evidence of thrombus. Normal compressibility and flow on color Doppler imaging. Femoral Vein: No evidence of thrombus. Normal compressibility, respiratory phasicity and response to augmentation. Popliteal Vein: No evidence of thrombus. Normal compressibility, respiratory phasicity and response to augmentation. Calf Veins: No evidence of thrombus. Normal compressibility and flow on color Doppler imaging. Other Findings: Small nondistended simple appearing right popliteal fossa Baker's cyst measures 2.9 x 0.7 x 1.5 cm. LEFT LOWER EXTREMITY Common Femoral Vein: No evidence of thrombus. Normal compressibility, respiratory phasicity and response to augmentation. Saphenofemoral Junction: No evidence of thrombus. Normal compressibility and flow on color Doppler imaging. Profunda Femoral Vein: No evidence of thrombus. Normal compressibility and flow on color Doppler imaging. Femoral Vein: No evidence of thrombus. Normal compressibility, respiratory phasicity and response to augmentation. Popliteal Vein: No evidence of thrombus. Normal compressibility, respiratory phasicity and response to augmentation. Calf Veins: No evidence of thrombus. Normal compressibility and flow on color Doppler imaging. Other Findings: Small nondistended complex left popliteal fossa Baker's cyst with internal debris or hemorrhage measures 3.8 x 3.9 x 1.5 cm IMPRESSION: 1. No evidence of deep venous thrombosis in either lower extremity. 2. Small bilateral popliteal fossa Baker's cysts as above. Electronically Signed   By: Judie Petit.  Shick M.D.   On: 09/18/2023 13:57   DG Chest Port 1 View  Result Date: 09/18/2023 CLINICAL DATA:  Weakness, fever EXAM: PORTABLE CHEST 1 VIEW COMPARISON:  07/13/2021 FINDINGS: Mild elevation of the right hemidiaphragm, progressive since prior examination. Bibasilar atelectasis. No confluent pulmonary infiltrate. No pneumothorax or pleural effusion. Cardiac size within normal  limits. Pulmonary vascularity is normal. No acute bone abnormality. IMPRESSION: 1. Mild elevation of the right hemidiaphragm, progressive since prior examination. Bibasilar atelectasis. Electronically Signed   By: Helyn Numbers M.D.   On: 09/18/2023 03:36     Family Communication: Discussed with patient and her daughter, they understand and agree. All questions answereed.    Disposition: Status is: Inpatient Remains inpatient appropriate because: left foot cellulitis, left ankle fracture  Planned Discharge Destination: Skilled nursing facility     Time spent: 40 minutes  Author: Marcelino Duster, MD 09/19/2023 12:48 PM Secure chat 7am to 7pm For on call review [email or web address].

## 2023-09-20 DIAGNOSIS — L03116 Cellulitis of left lower limb: Secondary | ICD-10-CM | POA: Diagnosis not present

## 2023-09-20 DIAGNOSIS — I10 Essential (primary) hypertension: Secondary | ICD-10-CM | POA: Diagnosis not present

## 2023-09-20 DIAGNOSIS — E871 Hypo-osmolality and hyponatremia: Secondary | ICD-10-CM | POA: Diagnosis not present

## 2023-09-20 DIAGNOSIS — E876 Hypokalemia: Secondary | ICD-10-CM | POA: Diagnosis not present

## 2023-09-20 LAB — BASIC METABOLIC PANEL
Anion gap: 8 (ref 5–15)
BUN: 13 mg/dL (ref 8–23)
CO2: 28 mmol/L (ref 22–32)
Calcium: 8.3 mg/dL — ABNORMAL LOW (ref 8.9–10.3)
Chloride: 96 mmol/L — ABNORMAL LOW (ref 98–111)
Creatinine, Ser: 0.7 mg/dL (ref 0.61–1.24)
GFR, Estimated: >60 mL/min (ref >60)
Glucose, Bld: 109 mg/dL — ABNORMAL HIGH (ref 70–99)
Potassium: 3.5 mmol/L (ref 3.5–5.1)
Sodium: 132 mmol/L — ABNORMAL LOW (ref 135–145)

## 2023-09-20 LAB — CBC
HCT: 28.9 % — ABNORMAL LOW (ref 39.0–52.0)
Hemoglobin: 9.8 g/dL — ABNORMAL LOW (ref 13.0–17.0)
MCH: 26.4 pg (ref 26.0–34.0)
MCHC: 33.9 g/dL (ref 30.0–36.0)
MCV: 77.9 fL — ABNORMAL LOW (ref 80.0–100.0)
Platelets: 81 10*3/uL — ABNORMAL LOW (ref 150–400)
RBC: 3.71 MIL/uL — ABNORMAL LOW (ref 4.22–5.81)
RDW: 13.4 % (ref 11.5–15.5)
WBC: 2 10*3/uL — ABNORMAL LOW (ref 4.0–10.5)
nRBC: 0 % (ref 0.0–0.2)

## 2023-09-20 MED ORDER — POTASSIUM CHLORIDE 20 MEQ PO PACK
40.0000 meq | PACK | Freq: Once | ORAL | Status: AC
  Administered 2023-09-20: 40 meq via ORAL
  Filled 2023-09-20: qty 2

## 2023-09-20 MED ORDER — LORATADINE 10 MG PO TABS
10.0000 mg | ORAL_TABLET | Freq: Every day | ORAL | Status: DC
Start: 2023-09-20 — End: 2023-09-23
  Administered 2023-09-20 – 2023-09-23 (×4): 10 mg via ORAL
  Filled 2023-09-20 (×4): qty 1

## 2023-09-20 MED ORDER — POLYETHYLENE GLYCOL 3350 17 G PO PACK
17.0000 g | PACK | Freq: Every day | ORAL | Status: DC
  Administered 2023-09-20 – 2023-09-22 (×3): 17 g via ORAL
  Filled 2023-09-20 (×4): qty 1

## 2023-09-20 NOTE — Plan of Care (Signed)

## 2023-09-20 NOTE — Progress Notes (Signed)
Physical Therapy Treatment Patient Details Name: Ian Sanders MRN: [id number] DOB: 09-19-1941 Today's Date: 09/20/2023   History of Present Illness Pt. is a 82 y.o. male with medical history significant of PAF not on anticoagulation, HTN, TIA stroke, cirrhosis with chronic thrombocytopenia, presented with worsening of left leg rash and pain and generalized weakness. Patient sustained a mechanical fall 7 days ago, and broke left ankle and came to ED.  X-ray showed distal fibula fracture in the case was reviewed by podiatry Dr. Allena Katz and patient was considered to be stable and sent home with cam boot and ambulating on a walker.  Gradually patient started noticed a new onset rash of the left foot and extends to mid shin in the last 2 days associated with pain.  No fever or chills.  Meantime patient fell even more weaker, also patient had a low-grade fever 100.12 nights ago.    PT Comments  Pt received in Semi-Fowler's position and agreeable to therapy.  Pt performed well with transfers and able to assist with donning CAM boot.  Pt needs assistance with straps, however able to do majority of the donning.  Pt then stood at EOB from slightly lifted bed and use of RW.  Pt then ambulated around the foot of the bed and then went to the bathroom.  Pt performed well with all the ambulation and only limited due to the pain in the L LE.  Pt transferred back to bed with all needs met and call bell within reach.      If plan is discharge home, recommend the following: A little help with walking and/or transfers;A lot of help with bathing/dressing/bathroom;Assistance with cooking/housework;Assist for transportation;Help with stairs or ramp for entrance   Can travel by private vehicle     Yes  Equipment Recommendations       Recommendations for Other Services       Precautions / Restrictions Restrictions Weight Bearing Restrictions: Yes LLE Weight Bearing: Weight bearing as tolerated Other  Position/Activity Restrictions: Use of cam boot on left foot while walking.     Mobility  Bed Mobility Overal bed mobility: Needs Assistance Bed Mobility: Supine to Sit, Sit to Supine     Supine to sit: Contact guard, HOB elevated Sit to supine: Min assist   General bed mobility comments: Assist required for LEs    Transfers Overall transfer level: Needs assistance Equipment used: Rolling walker (2 wheels) Transfers: Sit to/from Stand Sit to Stand: Min assist           General transfer comment: with CAM boot donned.    Ambulation/Gait Ambulation/Gait assistance: Contact guard assist Gait Distance (Feet): 24 Feet Assistive device: Rolling walker (2 wheels) Gait Pattern/deviations: Step-to pattern Gait velocity: decreased     General Gait Details: slowed gai pattern and heavy use of UE's to offload the L LE in the CAM boot.   Stairs             Wheelchair Mobility     Tilt Bed    Modified Rankin (Stroke Patients Only)       Balance Overall balance assessment: Needs assistance Sitting-balance support: Bilateral upper extremity supported, Feet supported Sitting balance-Leahy Scale: Good     Standing balance support: Bilateral upper extremity supported Standing balance-Leahy Scale: Fair Standing balance comment: reliant on BUE support on RW.                            Cognition Arousal:  Alert Behavior During Therapy: WFL for tasks assessed/performed Overall Cognitive Status: Within Functional Limits for tasks assessed                                          Exercises      General Comments        Pertinent Vitals/Pain Pain Assessment Pain Assessment: Faces Faces Pain Scale: Hurts even more Pain Location: L foot/ankle Pain Descriptors / Indicators: Discomfort Pain Intervention(s): Limited activity within patient's tolerance, Monitored during session, Premedicated before session    Home Living                           Prior Function            PT Goals (current goals can now be found in the care plan section) Acute Rehab PT Goals Patient Stated Goal: to go home PT Goal Formulation: With patient Time For Goal Achievement: 10/02/23 Potential to Achieve Goals: Good Progress towards PT goals: Progressing toward goals    Frequency    Min 1X/week      PT Plan      Co-evaluation              AM-PAC PT "6 Clicks" Mobility   Outcome Measure  Help needed turning from your back to your side while in a flat bed without using bedrails?: A Little Help needed moving from lying on your back to sitting on the side of a flat bed without using bedrails?: A Little Help needed moving to and from a bed to a chair (including a wheelchair)?: A Little Help needed standing up from a chair using your arms (e.g., wheelchair or bedside chair)?: A Little Help needed to walk in hospital room?: A Little Help needed climbing 3-5 steps with a railing? : A Lot 6 Click Score: 17    End of Session Equipment Utilized During Treatment: Gait belt Activity Tolerance: Other (comment);Patient limited by pain Patient left: in bed Nurse Communication: Mobility status PT Visit Diagnosis: Other abnormalities of gait and mobility (R26.89);Muscle weakness (generalized) (M62.81);History of falling (Z91.81);Pain Pain - Right/Left: Left Pain - part of body: Ankle and joints of foot     Time: 3086-5784 PT Time Calculation (min) (ACUTE ONLY): 34 min  Charges:    $Therapeutic Activity: 23-37 mins PT General Charges $$ ACUTE PT VISIT: 1 Visit                     Nolon Bussing, PT, DPT Physical Therapist - [facility]  09/20/23, 5:07 PM

## 2023-09-20 NOTE — Consult Note (Signed)
[name] Note  09/20/2023  DONOVAN PERSLEY [birth year] [id number]  Location: RN Hospital Liaison screened the patient remotely at [facility].  Insurance: [employer]   Ian Sanders is a 82 y.o. male who is a Primary Care Patient of Malva Limes, MD The patient was screened for  readmission hospitalization with noted medium risk score for unplanned readmission risk with 1 IP/1 ED in 6 months.  The patient was assessed for potential Care Management service needs for post hospital transition for care coordination. Review of patient's electronic medical record reveals patient was admitted with Cellulitis of the left foot. Discharge disposition for SNF level of care for ongoing STR. Facility will continue to address his ongoing needs post hospital discharge to the SNF.   VBCI Care Management/Population Health does not replace or interfere with any arrangements made by the Inpatient Transition of Care team.   For questions contact:   Elliot Cousin, RN, [facility] Liaison [place]   [facility], Population Health Office Hours [email or web address]  8:00 am-6:00 pm Direct Dial: [phone] mobile [phone] [Office toll free line] Office Hours are M-F 8:30 - 5 pm [email or web address]

## 2023-09-20 NOTE — Progress Notes (Signed)
Progress Note   Patient: Ian Sanders [id number]:[id number] DOB: 1940/12/22 DOA: 09/18/2023     2 DOS: the patient was seen and examined on 09/20/2023   Brief hospital course: LESLY JOSLYN is a 82 y.o. male with medical history significant of PAF not on anticoagulation, HTN, TIA stroke, cirrhosis with chronic thrombocytopenia, presented with worsening of left leg rash and pain and generalized weakness   Assessment and Plan: Left leg cellulitis Redness improving. Continue ceftriaxone for another day before changing to  MRSA screening negative, strep antigen negative. DVT study negative. Continue pain control.   Left ankle fracture Acute on chronic ambulation dysfunction- Continue cam boot and walker. PT worked with him, off balance, pain interferes with ambulation, recommended SNF.   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 Trend sodium.   Hypokalemia K 3.5 today. Recheck and replace accordingly.   PAF Currently sinus rhythm. Continue Cardizem and flecainide Not on anticoagulations for unknown reason   HTN Continue Cardizem and losartan   Chronic neuropathy Continue gabapentin He is on percecet which is continued for pain.  Pancytopenia- In the setting of cellulitis, cirrhosis. WBC, platelets seems low from prior labs. Continue to monitor daily CBC. No active bleeding noted.   Out of bed to chair. Incentive spirometry. Nursing supportive care. Fall, aspiration precautions. DVT prophylaxis   Code Status: Full Code  Subjective: Patient is seen and examined today morning. He is lying comfortably. Left foot redness better. Family at bedside. Able to to go bathroom. Eating fair.  Physical Exam: Vitals:   09/19/23 0757 09/19/23 1549 09/19/23 2328 09/20/23 0756  BP: (!) 110/46 (!) 105/45 (!) 128/59 (!) 128/52  Pulse: (!) 55 61 66 (!) 58  Resp: 14 14 18 14   Temp: 98.2 F (36.8 C) 98.3 F (36.8 C) 97.9 F (36.6 C)  97.7 F (36.5 C)  TempSrc: Oral Oral    SpO2: 92% 94% 96% 97%    General - Elderly Caucasian male, no apparent distress HEENT - PERRLA, EOMI, atraumatic head, non tender sinuses. Lung - Clear, no rales, rhonchi, wheezes. Heart - S1, S2 heard, no murmurs, rubs, left> right pedal edema. Abdomen - Soft, non tender, bowel sounds good Neuro - Alert, awake and oriented x 3, non focal exam. Skin - Warm and dry. Left foot redness, warmth improved.  Data Reviewed:      Latest Ref Rng & Units 09/20/2023    4:21 AM 09/18/2023    3:18 AM 04/09/2023    3:09 PM  CBC  WBC 4.0 - 10.5 K/uL 2.0  4.9  2.8   Hemoglobin 13.0 - 17.0 g/dL 9.8  72.5  36.6   Hematocrit 39.0 - 52.0 % 28.9  33.8  36.2   Platelets 150 - 400 K/uL 81  95  56       Latest Ref Rng & Units 09/20/2023    4:21 AM 09/18/2023    3:18 AM 04/09/2023    3:09 PM  BMP  Glucose 70 - 99 mg/dL 440  347  425   BUN 8 - 23 mg/dL 13  15  12    Creatinine 0.61 - 1.24 mg/dL 9.56  3.87  5.64   Sodium 135 - 145 mmol/L 132  130  133   Potassium 3.5 - 5.1 mmol/L 3.5  3.1  3.8   Chloride 98 - 111 mmol/L 96  93  96   CO2 22 - 32 mmol/L 28  28  27    Calcium 8.9 - 10.3 mg/dL  8.3  8.6  9.0    US Venous Img Lower Bilateral (DVT)  Result Date: 09/18/2023 CLINICAL DATA:  40981 Swelling 19147 EXAM: BILATERAL LOWER EXTREMITY VENOUS DOPPLER ULTRASOUND TECHNIQUE: Gray-scale sonography with graded compression, as well as color Doppler and duplex ultrasound were performed to evaluate the lower extremity deep venous systems from the level of the common femoral vein and including the common femoral, femoral, profunda femoral, popliteal and calf veins including the posterior tibial, peroneal and gastrocnemius veins when visible. Spectral Doppler was utilized to evaluate flow at rest and with distal augmentation maneuvers in the common femoral, femoral and popliteal veins. COMPARISON:  None Available. FINDINGS: RIGHT LOWER EXTREMITY Common Femoral Vein: No evidence  of thrombus. Normal compressibility, respiratory phasicity and response to augmentation. Saphenofemoral Junction: No evidence of thrombus. Normal compressibility and flow on color Doppler imaging. Profunda Femoral Vein: No evidence of thrombus. Normal compressibility and flow on color Doppler imaging. Femoral Vein: No evidence of thrombus. Normal compressibility, respiratory phasicity and response to augmentation. Popliteal Vein: No evidence of thrombus. Normal compressibility, respiratory phasicity and response to augmentation. Calf Veins: No evidence of thrombus. Normal compressibility and flow on color Doppler imaging. Other Findings: Small nondistended simple appearing right popliteal fossa Baker's cyst measures 2.9 x 0.7 x 1.5 cm. LEFT LOWER EXTREMITY Common Femoral Vein: No evidence of thrombus. Normal compressibility, respiratory phasicity and response to augmentation. Saphenofemoral Junction: No evidence of thrombus. Normal compressibility and flow on color Doppler imaging. Profunda Femoral Vein: No evidence of thrombus. Normal compressibility and flow on color Doppler imaging. Femoral Vein: No evidence of thrombus. Normal compressibility, respiratory phasicity and response to augmentation. Popliteal Vein: No evidence of thrombus. Normal compressibility, respiratory phasicity and response to augmentation. Calf Veins: No evidence of thrombus. Normal compressibility and flow on color Doppler imaging. Other Findings: Small nondistended complex left popliteal fossa Baker's cyst with internal debris or hemorrhage measures 3.8 x 3.9 x 1.5 cm IMPRESSION: 1. No evidence of deep venous thrombosis in either lower extremity. 2. Small bilateral popliteal fossa Baker's cysts as above. Electronically Signed   By: Judie Petit.  Shick M.D.   On: 09/18/2023 13:57     Family Communication: Discussed with patient and her daughter, they understand and agree. All questions answereed.    Disposition: Status is: Inpatient Remains  inpatient appropriate because: left foot cellulitis, left ankle fracture  Planned Discharge Destination: Skilled nursing facility     Time spent: 38 minutes  Author: Marcelino Duster, MD 09/20/2023 11:17 AM Secure chat 7am to 7pm For on call review [email or web address].

## 2023-09-21 DIAGNOSIS — L03116 Cellulitis of left lower limb: Secondary | ICD-10-CM | POA: Diagnosis not present

## 2023-09-21 DIAGNOSIS — I10 Essential (primary) hypertension: Secondary | ICD-10-CM | POA: Diagnosis not present

## 2023-09-21 DIAGNOSIS — E871 Hypo-osmolality and hyponatremia: Secondary | ICD-10-CM | POA: Diagnosis not present

## 2023-09-21 DIAGNOSIS — E876 Hypokalemia: Secondary | ICD-10-CM | POA: Diagnosis not present

## 2023-09-21 LAB — BASIC METABOLIC PANEL
Anion gap: 9 (ref 5–15)
BUN: 14 mg/dL (ref 8–23)
CO2: 25 mmol/L (ref 22–32)
Calcium: 8.5 mg/dL — ABNORMAL LOW (ref 8.9–10.3)
Chloride: 95 mmol/L — ABNORMAL LOW (ref 98–111)
Creatinine, Ser: 0.67 mg/dL (ref 0.61–1.24)
GFR, Estimated: >60 mL/min (ref >60)
Glucose, Bld: 100 mg/dL — ABNORMAL HIGH (ref 70–99)
Potassium: 3.6 mmol/L (ref 3.5–5.1)
Sodium: 129 mmol/L — ABNORMAL LOW (ref 135–145)

## 2023-09-21 LAB — CBC
HCT: 31.3 % — ABNORMAL LOW (ref 39.0–52.0)
Hemoglobin: 10.6 g/dL — ABNORMAL LOW (ref 13.0–17.0)
MCH: 26.3 pg (ref 26.0–34.0)
MCHC: 33.9 g/dL (ref 30.0–36.0)
MCV: 77.7 fL — ABNORMAL LOW (ref 80.0–100.0)
Platelets: 89 10*3/uL — ABNORMAL LOW (ref 150–400)
RBC: 4.03 MIL/uL — ABNORMAL LOW (ref 4.22–5.81)
RDW: 13.4 % (ref 11.5–15.5)
WBC: 3.3 10*3/uL — ABNORMAL LOW (ref 4.0–10.5)
nRBC: 0 % (ref 0.0–0.2)

## 2023-09-21 MED ORDER — CALCIUM CARBONATE ANTACID 500 MG PO CHEW
1.0000 | CHEWABLE_TABLET | Freq: Two times a day (BID) | ORAL | Status: DC
  Administered 2023-09-21 – 2023-09-23 (×4): 200 mg via ORAL
  Filled 2023-09-21 (×4): qty 1

## 2023-09-21 MED ORDER — ALUM & MAG HYDROXIDE-SIMETH 200-200-20 MG/5ML PO SUSP
15.0000 mL | Freq: Four times a day (QID) | ORAL | Status: DC | PRN
  Administered 2023-09-21: 15 mL via ORAL
  Filled 2023-09-21: qty 30

## 2023-09-21 NOTE — Progress Notes (Signed)
Occupational Therapy Treatment Patient Details Name: Ian Sanders MRN: [id number] DOB: 04/09/1941 Today's Date: 09/21/2023   History of present illness Pt. is a 82 y.o. male with medical history significant of PAF not on anticoagulation, HTN, TIA stroke, cirrhosis with chronic thrombocytopenia, presented with worsening of left leg rash and pain and generalized weakness. Patient sustained a mechanical fall 7 days ago, and broke left ankle and came to ED.  X-ray showed distal fibula fracture in the case was reviewed by podiatry Dr. Allena Katz and patient was considered to be stable and sent home with cam boot and ambulating on a walker.  Gradually patient started noticed a new onset rash of the left foot and extends to mid shin in the last 2 days associated with pain.  No fever or chills.  Meantime patient fell even more weaker, also patient had a low-grade fever 100.12 nights ago.   OT comments  Pt is supine in bed on arrival. Easily arousable and agreeable to OT session. He reports 4/10 pain in L foot. Pt performed bed mobility with SUP and use of bedrails today which is improved. He donned R sock seated at EOB for LB dressing with SBA, no LOB noted. Assist with donning CAM boot on LLE. STS from EOB with Min A d/t initial posterior lean and difficulty reaching upright posture, reports his bed is slightly higher at home. He ambulated within the room using RW with CGA to stand at sink and perform oral hygiene with CGA and no UE support. Pt returned to bed with all needs in place and will cont to require skilled acute OT services to maximize his safety and IND to return to PLOF.       If plan is discharge home, recommend the following:  A lot of help with bathing/dressing/bathroom;Assistance with cooking/housework;A lot of help with walking and/or transfers   Equipment Recommendations  Other (comment) (defer to next venue)    Recommendations for Other Services      Precautions / Restrictions  Precautions Precautions: Fall Restrictions Weight Bearing Restrictions: Yes LLE Weight Bearing: Weight bearing as tolerated Other Position/Activity Restrictions: Use of cam boot on left foot while walking.       Mobility Bed Mobility Overal bed mobility: Needs Assistance Bed Mobility: Supine to Sit, Sit to Supine     Supine to sit: HOB elevated, Supervision, Used rails Sit to supine: Supervision, Used rails   General bed mobility comments: no physical assist needed today, noted improvement with bed mobility    Transfers Overall transfer level: Needs assistance Equipment used: Rolling walker (2 wheels) Transfers: Sit to/from Stand Sit to Stand: Min assist           General transfer comment: Min A for STS from EOB with poserior bias intially, but CGA for balance and mobility once upright posture achieved     Balance Overall balance assessment: Needs assistance Sitting-balance support: Bilateral upper extremity supported, Feet supported Sitting balance-Leahy Scale: Good Sitting balance - Comments: able to don R sock seated EOB with no LOB   Standing balance support: No upper extremity supported, During functional activity Standing balance-Leahy Scale: Fair Standing balance comment: able to maintain balance with CGA standing at sink to brush teeth with no UE support                           ADL either performed or assessed with clinical judgement   ADL Overall ADL's : Needs assistance/impaired  Grooming: Oral care;Standing;Contact guard assist               Lower Body Dressing: Supervision/safety;Sitting/lateral leans Lower Body Dressing Details (indicate cue type and reason): to don R sock on EOB             Functional mobility during ADLs: Contact guard assist;Rolling walker (2 wheels) General ADL Comments: CGA with use of RW to ambulate around bed and into bathroom to stand at sink for oral hygiene    Extremity/Trunk Assessment Upper  Extremity Assessment Upper Extremity Assessment: Generalized weakness   Lower Extremity Assessment Lower Extremity Assessment: Generalized weakness LLE Deficits / Details: swollen, red, edematous        Vision       Perception     Praxis      Cognition Arousal: Alert Behavior During Therapy: WFL for tasks assessed/performed Overall Cognitive Status: Within Functional Limits for tasks assessed                                          Exercises      Shoulder Instructions       General Comments      Pertinent Vitals/ Pain       Pain Assessment Pain Assessment: 0-10 Pain Score: 4  Pain Location: L foot/ankle Pain Descriptors / Indicators: Discomfort Pain Intervention(s): Monitored during session  Home Living                                          Prior Functioning/Environment              Frequency  Min 1X/week        Progress Toward Goals  OT Goals(current goals can now be found in the care plan section)  Progress towards OT goals: Progressing toward goals  Acute Rehab OT Goals Patient Stated Goal: improve pain OT Goal Formulation: With patient Time For Goal Achievement: 10/02/23 Potential to Achieve Goals: Good  Plan      Co-evaluation                 AM-PAC OT "6 Clicks" Daily Activity     Outcome Measure   Help from another person eating meals?: None Help from another person taking care of personal grooming?: None Help from another person toileting, which includes using toliet, bedpan, or urinal?: A Little Help from another person bathing (including washing, rinsing, drying)?: A Little Help from another person to put on and taking off regular upper body clothing?: A Little Help from another person to put on and taking off regular lower body clothing?: A Little 6 Click Score: 20    End of Session Equipment Utilized During Treatment: Rolling walker (2 wheels)  OT Visit Diagnosis: Muscle  weakness (generalized) (M62.81);History of falling (Z91.81)   Activity Tolerance Patient tolerated treatment well   Patient Left in bed;with call bell/phone within reach;with bed alarm set;with family/visitor present   Nurse Communication Mobility status        Time: 1191-4782 OT Time Calculation (min): 25 min  Charges: OT General Charges $OT Visit: 1 Visit OT Treatments $Self Care/Home Management : 8-22 mins $Therapeutic Activity: 8-22 mins  Samir Ishaq, OTR/L  09/21/23, 3:20 PM  Heydy Montilla E Abdulhamid Olgin 09/21/2023, 3:18 PM

## 2023-09-21 NOTE — Plan of Care (Signed)

## 2023-09-21 NOTE — TOC Progression Note (Signed)
Transition of Care [facility]) - Progression Note    Patient Details  Name: NICKLOUS ABURTO MRN: [id number] Date of Birth: Dec 27, 1940  Transition of [facility]) CM/SW Contact  Garret Reddish, RN Phone Number: 09/21/2023, 4:04 PM  Clinical Narrative:    I have reviewed bed offers with patient.  He has chosen [facility].    I have spoken with Tammy with [employer] and started insurance authorization.   TOC will continue to follow for discharge planning.          Expected Discharge Plan and Services                                               Social Determinants of Health (SDOH) Interventions SDOH Screenings   Food Insecurity: No Food Insecurity (09/18/2023)  Housing: Low Risk  (09/18/2023)  Transportation Needs: No Transportation Needs (09/18/2023)  Utilities: Not At Risk (09/18/2023)  Alcohol Screen: Low Risk  (01/13/2023)  Depression (PHQ2-9): Low Risk  (01/13/2023)  Financial Resource Strain: Low Risk  (01/13/2023)  Physical Activity: Inactive (01/13/2023)  Social Connections: Moderately Isolated (01/13/2023)  Stress: No Stress Concern Present (01/13/2023)  Tobacco Use: High Risk (09/18/2023)    Readmission Risk Interventions     No data to display

## 2023-09-21 NOTE — Plan of Care (Signed)
  Problem: Education: Goal: Knowledge of General Education information will improve Description: Including pain rating scale, medication(s)/side effects and non-pharmacologic comfort measures Outcome: Progressing   Problem: Health Behavior/Discharge Planning: Goal: Ability to manage health-related needs will improve Outcome: Progressing   Problem: Clinical Measurements: Goal: Ability to maintain clinical measurements within normal limits will improve Outcome: Progressing Goal: Diagnostic test results will improve Outcome: Progressing   Problem: Activity: Goal: Risk for activity intolerance will decrease Outcome: Progressing   Problem: Nutrition: Goal: Adequate nutrition will be maintained Outcome: Progressing   Problem: Elimination: Goal: Will not experience complications related to bowel motility Outcome: Progressing Goal: Will not experience complications related to urinary retention Outcome: Progressing   Problem: Pain Management: Goal: General experience of comfort will improve Outcome: Progressing   Problem: Safety: Goal: Ability to remain free from injury will improve Outcome: Progressing   Problem: Skin Integrity: Goal: Risk for impaired skin integrity will decrease Outcome: Progressing

## 2023-09-21 NOTE — Progress Notes (Signed)
Progress Note   Patient: Ian Sanders JXB:[id number] DOB: [birth year] DOA: 09/18/2023     3 DOS: the patient was seen and examined on 09/21/2023   Brief hospital course: Ian Sanders is a 82 y.o. male with medical history significant of PAF not on anticoagulation, HTN, TIA stroke, cirrhosis with chronic thrombocytopenia, presented with worsening of left leg rash and pain and generalized weakness   Assessment and Plan: Left leg cellulitis Redness much better Continue ceftriaxone for another day. MRSA screening negative, strep antigen negative. DVT study negative. Continue pain control.   Left ankle fracture Acute on chronic ambulation dysfunction- Continue cam boot and walker. PT worked with him, off balance, pain interferes with ambulation, recommended SNF.   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 Trend sodium.   Hypokalemia K 3.6 today. Recheck and replace accordingly.   PAF Currently sinus rhythm. Continue Cardizem and flecainide. Chadvasc score 6, no longer on Xarelto due to history of subdural hematoma and thrombocytopenia as per Dr. Darrold Junker note 04/27/23.   HTN Continue Cardizem and losartan   Chronic neuropathy Continue gabapentin He is on percocet at home, continued for pain.  Pancytopenia- In the setting of cellulitis, cirrhosis. WBC, platelets seems low from prior labs. Hb stable around 10. Continue to monitor daily CBC. No active bleeding noted.   Continue PPI, Tums. Out of bed to chair. Incentive spirometry. Nursing supportive care. Fall, aspiration precautions. DVT prophylaxis   Code Status: Full Code  Subjective: Patient is seen and examined today morning. He is lying comfortably. Left foot redness improved. Pain better. Complained of indigestion, gas. Working with PT. Eating fair.  Physical Exam: Vitals:   09/20/23 0756 09/20/23 1540 09/20/23 2112 09/21/23 0844  BP: (!) 128/52 (!) 130/58  (!) 118/52 131/60  Pulse: (!) 58 66 63 69  Resp: 14 14 17 16   Temp: 97.7 F (36.5 C) 98.4 F (36.9 C) 98.9 F (37.2 C) 98 F (36.7 C)  TempSrc:    Oral  SpO2: 97% 95% 94% 93%    General - Elderly Caucasian male, no apparent distress HEENT - PERRLA, EOMI, atraumatic head, non tender sinuses. Lung - Clear, no rales, rhonchi, wheezes. Heart - S1, S2 heard, no murmurs, rubs, left> right pedal edema. Abdomen - Soft, non tender, bowel sounds good Neuro - Alert, awake and oriented x 3, non focal exam. Skin - Warm and dry. Left foot redness, warmth improved.  Data Reviewed:      Latest Ref Rng & Units 09/21/2023    6:01 AM 09/20/2023    4:21 AM 09/18/2023    3:18 AM  CBC  WBC 4.0 - 10.5 K/uL 3.3  2.0  4.9   Hemoglobin 13.0 - 17.0 g/dL 13.0  9.8  86.5   Hematocrit 39.0 - 52.0 % 31.3  28.9  33.8   Platelets 150 - 400 K/uL 89  81  95       Latest Ref Rng & Units 09/21/2023    6:01 AM 09/20/2023    4:21 AM 09/18/2023    3:18 AM  BMP  Glucose 70 - 99 mg/dL 784  696  295   BUN 8 - 23 mg/dL 14  13  15    Creatinine 0.61 - 1.24 mg/dL 2.84  1.32  4.40   Sodium 135 - 145 mmol/L 129  132  130   Potassium 3.5 - 5.1 mmol/L 3.6  3.5  3.1   Chloride 98 - 111 mmol/L 95  96  93  CO2 22 - 32 mmol/L 25  28  28    Calcium 8.9 - 10.3 mg/dL 8.5  8.3  8.6    No results found.   Family Communication: Discussed with patient and her daughter yesterday, they are aware of SNF need. All questions answereed.  Disposition: Status is: Inpatient Remains inpatient appropriate because: left foot cellulitis, left ankle fracture  Planned Discharge Destination: Skilled nursing facility     Time spent: 37 minutes  Author: Marcelino Duster, MD 09/21/2023 2:32 PM Secure chat 7am to 7pm For on call review [email or web address].

## 2023-09-22 DIAGNOSIS — E876 Hypokalemia: Secondary | ICD-10-CM

## 2023-09-22 DIAGNOSIS — R531 Weakness: Secondary | ICD-10-CM | POA: Diagnosis not present

## 2023-09-22 DIAGNOSIS — E871 Hypo-osmolality and hyponatremia: Secondary | ICD-10-CM | POA: Diagnosis not present

## 2023-09-22 DIAGNOSIS — L03116 Cellulitis of left lower limb: Secondary | ICD-10-CM | POA: Diagnosis not present

## 2023-09-22 DIAGNOSIS — R296 Repeated falls: Secondary | ICD-10-CM

## 2023-09-22 LAB — BASIC METABOLIC PANEL
Anion gap: 9 (ref 5–15)
BUN: 16 mg/dL (ref 8–23)
CO2: 26 mmol/L (ref 22–32)
Calcium: 8.2 mg/dL — ABNORMAL LOW (ref 8.9–10.3)
Chloride: 94 mmol/L — ABNORMAL LOW (ref 98–111)
Creatinine, Ser: 0.58 mg/dL — ABNORMAL LOW (ref 0.61–1.24)
GFR, Estimated: >60 mL/min (ref >60)
Glucose, Bld: 97 mg/dL (ref 70–99)
Potassium: 3.6 mmol/L (ref 3.5–5.1)
Sodium: 129 mmol/L — ABNORMAL LOW (ref 135–145)

## 2023-09-22 LAB — GLUCOSE, CAPILLARY: Glucose-Capillary: 100 mg/dL — ABNORMAL HIGH (ref 70–99)

## 2023-09-22 NOTE — Plan of Care (Signed)
  Problem: Education: Goal: Knowledge of General Education information will improve Description: Including pain rating scale, medication(s)/side effects and non-pharmacologic comfort measures Outcome: Progressing   Problem: Health Behavior/Discharge Planning: Goal: Ability to manage health-related needs will improve Outcome: Progressing   Problem: Clinical Measurements: Goal: Ability to maintain clinical measurements within normal limits will improve Outcome: Progressing   Problem: Activity: Goal: Risk for activity intolerance will decrease Outcome: Progressing   Problem: Nutrition: Goal: Adequate nutrition will be maintained Outcome: Progressing   Problem: Elimination: Goal: Will not experience complications related to bowel motility Outcome: Progressing Goal: Will not experience complications related to urinary retention Outcome: Progressing   Problem: Pain Management: Goal: General experience of comfort will improve Outcome: Progressing

## 2023-09-22 NOTE — Progress Notes (Signed)
Physical Therapy Treatment Patient Details Name: Ian Sanders MRN: [id number] DOB: February 24, 1941 Today's Date: 09/22/2023   History of Present Illness Pt. is a 82 y.o. male with medical history significant of PAF not on anticoagulation, HTN, TIA stroke, cirrhosis with chronic thrombocytopenia, presented with worsening of left leg rash and pain and generalized weakness. Patient sustained a mechanical fall 7 days ago, and broke left ankle and came to ED.  X-ray showed distal fibula fracture in the case was reviewed by podiatry Dr. Allena Katz and patient was considered to be stable and sent home with cam boot and ambulating on a walker.  Gradually patient started noticed a new onset rash of the left foot and extends to mid shin in the last 2 days associated with pain.  No fever or chills.  Meantime patient fell even more weaker, also patient had a low-grade fever 100.12 nights ago.    PT Comments  Patient received in bed, he is agreeable to PT session. He reports the boot is very painful and limits his ability to walk. He is mod I with bed mobility, requires mod A to stand from elevated bed. Once standing he ambulated 50 feet with RW and cga. Slow pace, pain limited. Patient will continue to benefit from skilled PT to improve functional mobility, independence and safety.      If plan is discharge home, recommend the following: A little help with walking and/or transfers;A lot of help with bathing/dressing/bathroom;Assistance with cooking/housework;Assist for transportation;Help with stairs or ramp for entrance   Can travel by private vehicle     Yes  Equipment Recommendations       Recommendations for Other Services       Precautions / Restrictions Precautions Precautions: Fall Restrictions Weight Bearing Restrictions: Yes LLE Weight Bearing: Weight bearing as tolerated Other Position/Activity Restrictions: Use of cam boot on left foot while walking.     Mobility  Bed Mobility Overal bed  mobility: Modified Independent Bed Mobility: Supine to Sit     Supine to sit: HOB elevated, Modified independent (Device/Increase time)     General bed mobility comments: no physical assist needed today    Transfers Overall transfer level: Needs assistance Equipment used: Rolling walker (2 wheels) Transfers: Sit to/from Stand Sit to Stand: From elevated surface, Mod assist                Ambulation/Gait Ambulation/Gait assistance: Contact guard assist Gait Distance (Feet): 50 Feet Assistive device: Rolling walker (2 wheels) Gait Pattern/deviations: Step-through pattern, Decreased step length - right, Decreased step length - left, Decreased stride length, Trunk flexed Gait velocity: decreased     General Gait Details: distance limited by pain in L ankle   Stairs             Wheelchair Mobility     Tilt Bed    Modified Rankin (Stroke Patients Only)       Balance Overall balance assessment: Needs assistance Sitting-balance support: Feet supported Sitting balance-Leahy Scale: Normal     Standing balance support: Bilateral upper extremity supported, During functional activity, Reliant on assistive device for balance Standing balance-Leahy Scale: Good                              Cognition Arousal: Alert Behavior During Therapy: WFL for tasks assessed/performed Overall Cognitive Status: Within Functional Limits for tasks assessed  Exercises      General Comments        Pertinent Vitals/Pain Pain Assessment Pain Assessment: Faces Faces Pain Scale: Hurts little more Pain Location: L foot/ankle Pain Descriptors / Indicators: Discomfort Pain Intervention(s): Monitored during session    Home Living                          Prior Function            PT Goals (current goals can now be found in the care plan section) Acute Rehab PT Goals Patient Stated Goal: to  go home PT Goal Formulation: With patient Time For Goal Achievement: 10/02/23 Potential to Achieve Goals: Good Progress towards PT goals: Progressing toward goals    Frequency    Min 1X/week      PT Plan      Co-evaluation              AM-PAC PT "6 Clicks" Mobility   Outcome Measure                   End of Session   Activity Tolerance: Patient limited by pain Patient left: in chair;with call bell/phone within reach;Other (comment) (has alarm pad in chair but no alarm box) Nurse Communication: Mobility status PT Visit Diagnosis: Other abnormalities of gait and mobility (R26.89);Muscle weakness (generalized) (M62.81);Pain;Difficulty in walking, not elsewhere classified (R26.2) Pain - Right/Left: Left Pain - part of body: Ankle and joints of foot     Time: 1135-1155 PT Time Calculation (min) (ACUTE ONLY): 20 min  Charges:    $Gait Training: 8-22 mins PT General Charges $$ ACUTE PT VISIT: 1 Visit                     Ila Landowski, PT, GCS 09/22/23,12:18 PM

## 2023-09-22 NOTE — Plan of Care (Signed)

## 2023-09-22 NOTE — Progress Notes (Signed)
Progress Note   Patient: Ian Sanders:[id number] DOB: 03/21/1941 DOA: 09/18/2023     4 DOS: the patient was seen and examined on 09/22/2023   Brief hospital course: Ian Sanders is a 82 y.o. male with medical history significant of PAF not on anticoagulation, HTN, TIA stroke, cirrhosis with chronic thrombocytopenia, presented with worsening of left leg rash and pain and generalized weakness   Patient is medically stable and ready for dc to SNF when bed available  Assessment and Plan: Left leg cellulitis- improving - completed ceftriaxone treatment DVT study negative. Continue pain control.   Left ankle fracture Acute on chronic ambulation dysfunction- Continue cam boot and walker. PT worked with him, off balance, pain interferes with ambulation, recommended SNF. 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 Trend sodium. - fluid restriction   Hypokalemia- stable K+ 3.6>3.6 Recheck and replace accordingly.   PAF Currently sinus rhythm. Continue Cardizem and flecainide. Chadvasc score 6, no longer on Xarelto due to history of subdural hematoma and thrombocytopenia as per Dr. Darrold Junker note 04/27/23.   HTN Continue Cardizem and losartan   Chronic neuropathy Continue gabapentin He is on percocet at home, continued for pain.  Pancytopenia- In the setting of cellulitis, cirrhosis. WBC, platelets seems low from prior labs. Hb stable around 10. No active bleeding noted.   Continue PPI, Tums. Out of bed to chair. Incentive spirometry. Nursing supportive care. Fall, aspiration precautions. DVT prophylaxis   Code Status: Full Code  Subjective: Patient reports feeling better today than he has since admission. He is ready for rehab. Pain with weight bearing but otherwise tolerable.  Physical Exam: Vitals:   09/20/23 2112 09/21/23 0844 09/21/23 1525 09/21/23 2103  BP: (!)  118/52 131/60 (!) 128/55 (!) 151/65  Pulse: 63 69 61 69  Resp: 17 16 15 18   Temp: 98.9 F (37.2 C) 98 F (36.7 C) 97.7 F (36.5 C) 97.7 F (36.5 C)  TempSrc:  Oral Oral   SpO2: 94% 93% 94% 95%   General - Elderly Caucasian male, no apparent distress HEENT - PERRLA, EOMI, atraumatic head, non tender sinuses. Lung - Clear, no rales, rhonchi, wheezes. Heart - S1, S2 heard, no murmurs, rubs, left> right pedal edema. Neuro - Alert, awake and oriented x 3, non focal exam. Skin - Warm and dry. Left foot redness, warmth improved.  Data Reviewed:    Latest Ref Rng & Units 09/21/2023    6:01 AM 09/20/2023    4:21 AM 09/18/2023    3:18 AM  CBC  WBC 4.0 - 10.5 K/uL 3.3  2.0  4.9   Hemoglobin 13.0 - 17.0 g/dL 95.6  9.8  21.3   Hematocrit 39.0 - 52.0 % 31.3  28.9  33.8   Platelets 150 - 400 K/uL 89  81  95       Latest Ref Rng & Units 09/21/2023    6:01 AM 09/20/2023    4:21 AM 09/18/2023    3:18 AM  BMP  Glucose 70 - 99 mg/dL 086  578  469   BUN 8 - 23 mg/dL 14  13  15    Creatinine 0.61 - 1.24 mg/dL 6.29  5.28  4.13   Sodium 135 - 145 mmol/L 129  132  130   Potassium 3.5 - 5.1 mmol/L 3.6  3.5  3.1   Chloride 98 - 111 mmol/L 95  96  93   CO2 22 - 32 mmol/L  25  28  28    Calcium 8.9 - 10.3 mg/dL 8.5  8.3  8.6    No results found.   Family Communication: sister and BIL at bedside  Disposition: Status is: Inpatient Remains inpatient appropriate because: left foot cellulitis, left ankle fracture  Planned Discharge Destination: Skilled nursing facility     Time spent: 37 minutes  Author: Leeroy Bock, MD 09/22/2023 7:42 AM Secure chat 7am to 7pm For on call review [email or web address].

## 2023-09-23 ENCOUNTER — Ambulatory Visit: Payer: PPO | Admitting: Podiatry

## 2023-09-23 DIAGNOSIS — E871 Hypo-osmolality and hyponatremia: Secondary | ICD-10-CM | POA: Diagnosis not present

## 2023-09-23 DIAGNOSIS — M6259 Muscle wasting and atrophy, not elsewhere classified, multiple sites: Secondary | ICD-10-CM | POA: Diagnosis not present

## 2023-09-23 DIAGNOSIS — E569 Vitamin deficiency, unspecified: Secondary | ICD-10-CM | POA: Diagnosis not present

## 2023-09-23 DIAGNOSIS — R296 Repeated falls: Secondary | ICD-10-CM | POA: Diagnosis not present

## 2023-09-23 DIAGNOSIS — S82832D Other fracture of upper and lower end of left fibula, subsequent encounter for closed fracture with routine healing: Secondary | ICD-10-CM | POA: Diagnosis not present

## 2023-09-23 DIAGNOSIS — M779 Enthesopathy, unspecified: Secondary | ICD-10-CM | POA: Diagnosis not present

## 2023-09-23 DIAGNOSIS — H353132 Nonexudative age-related macular degeneration, bilateral, intermediate dry stage: Secondary | ICD-10-CM | POA: Diagnosis not present

## 2023-09-23 DIAGNOSIS — M62838 Other muscle spasm: Secondary | ICD-10-CM | POA: Diagnosis not present

## 2023-09-23 DIAGNOSIS — I10 Essential (primary) hypertension: Secondary | ICD-10-CM | POA: Diagnosis not present

## 2023-09-23 DIAGNOSIS — G894 Chronic pain syndrome: Secondary | ICD-10-CM | POA: Diagnosis not present

## 2023-09-23 DIAGNOSIS — K219 Gastro-esophageal reflux disease without esophagitis: Secondary | ICD-10-CM | POA: Diagnosis not present

## 2023-09-23 DIAGNOSIS — J309 Allergic rhinitis, unspecified: Secondary | ICD-10-CM | POA: Diagnosis not present

## 2023-09-23 DIAGNOSIS — R531 Weakness: Secondary | ICD-10-CM | POA: Diagnosis not present

## 2023-09-23 DIAGNOSIS — Z741 Need for assistance with personal care: Secondary | ICD-10-CM | POA: Diagnosis not present

## 2023-09-23 DIAGNOSIS — H40003 Preglaucoma, unspecified, bilateral: Secondary | ICD-10-CM | POA: Diagnosis not present

## 2023-09-23 DIAGNOSIS — I4891 Unspecified atrial fibrillation: Secondary | ICD-10-CM | POA: Diagnosis not present

## 2023-09-23 DIAGNOSIS — R52 Pain, unspecified: Secondary | ICD-10-CM | POA: Diagnosis not present

## 2023-09-23 DIAGNOSIS — L03116 Cellulitis of left lower limb: Secondary | ICD-10-CM | POA: Diagnosis not present

## 2023-09-23 DIAGNOSIS — G4700 Insomnia, unspecified: Secondary | ICD-10-CM | POA: Diagnosis not present

## 2023-09-23 DIAGNOSIS — D518 Other vitamin B12 deficiency anemias: Secondary | ICD-10-CM | POA: Diagnosis not present

## 2023-09-23 DIAGNOSIS — E559 Vitamin D deficiency, unspecified: Secondary | ICD-10-CM | POA: Diagnosis not present

## 2023-09-23 DIAGNOSIS — M3501 Sicca syndrome with keratoconjunctivitis: Secondary | ICD-10-CM | POA: Diagnosis not present

## 2023-09-23 DIAGNOSIS — I639 Cerebral infarction, unspecified: Secondary | ICD-10-CM | POA: Diagnosis not present

## 2023-09-23 DIAGNOSIS — S8262XA Displaced fracture of lateral malleolus of left fibula, initial encounter for closed fracture: Secondary | ICD-10-CM | POA: Diagnosis not present

## 2023-09-23 DIAGNOSIS — R42 Dizziness and giddiness: Secondary | ICD-10-CM | POA: Diagnosis not present

## 2023-09-23 DIAGNOSIS — R2681 Unsteadiness on feet: Secondary | ICD-10-CM | POA: Diagnosis not present

## 2023-09-23 DIAGNOSIS — E876 Hypokalemia: Secondary | ICD-10-CM | POA: Diagnosis not present

## 2023-09-23 DIAGNOSIS — N3941 Urge incontinence: Secondary | ICD-10-CM | POA: Diagnosis not present

## 2023-09-23 DIAGNOSIS — H353211 Exudative age-related macular degeneration, right eye, with active choroidal neovascularization: Secondary | ICD-10-CM | POA: Diagnosis not present

## 2023-09-23 DIAGNOSIS — S82402D Unspecified fracture of shaft of left fibula, subsequent encounter for closed fracture with routine healing: Secondary | ICD-10-CM | POA: Diagnosis not present

## 2023-09-23 MED ORDER — CYCLOBENZAPRINE HCL 5 MG PO TABS: 5.0000 mg | ORAL_TABLET | Freq: Three times a day (TID) | ORAL | Status: DC | PRN

## 2023-09-23 MED ORDER — OXYCODONE-ACETAMINOPHEN 5-325 MG PO TABS: 1.0000 | ORAL_TABLET | ORAL | 0 refills | Status: AC | PRN

## 2023-09-23 MED ORDER — GABAPENTIN 300 MG PO CAPS: 300.0000 mg | ORAL_CAPSULE | Freq: Every day | ORAL | Status: DC

## 2023-09-23 MED ORDER — GABAPENTIN 100 MG PO CAPS: 100.0000 mg | ORAL_CAPSULE | Freq: Two times a day (BID) | ORAL | Status: DC

## 2023-09-23 MED ORDER — ZOLPIDEM TARTRATE 10 MG PO TABS: ORAL_TABLET | ORAL | 0 refills | Status: DC

## 2023-09-23 NOTE — TOC Progression Note (Signed)
Transition of Care [facility]) - Progression Note    Patient Details  Name: Ian Sanders MRN: [id number] Date of Birth: 01-Feb-1941  [facility] CM/SW Contact  Garret Reddish, RN Phone Number: 09/23/2023, 1:04 PM  Clinical Narrative:     Call received from Tammy with [facility].  She informs me that Medical Director has denied EMS transport.  Tammy reports that she will sent over denial letter.  I have make TOC team member Deliliah aware.       Expected Discharge Plan and Services         Expected Discharge Date: 09/23/23                                     Social Determinants of Health (SDOH) Interventions SDOH Screenings   Food Insecurity: No Food Insecurity (09/18/2023)  Housing: Low Risk  (09/18/2023)  Transportation Needs: No Transportation Needs (09/18/2023)  Utilities: Not At Risk (09/18/2023)  Alcohol Screen: Low Risk  (01/13/2023)  Depression (PHQ2-9): Low Risk  (01/13/2023)  Financial Resource Strain: Low Risk  (01/13/2023)  Physical Activity: Inactive (01/13/2023)  Social Connections: Moderately Isolated (01/13/2023)  Stress: No Stress Concern Present (01/13/2023)  Tobacco Use: High Risk (09/18/2023)    Readmission Risk Interventions     No data to display

## 2023-09-23 NOTE — Progress Notes (Signed)
DISCHARGE NOTE:   Pt's IV removed and pt educated about dc instructions. Nurse informed wife that she had to give facility the medical information. She states she understands. Pt voiced no concerns or questions at this time. Pt's wife transported pt to facility.

## 2023-09-23 NOTE — TOC Progression Note (Signed)
Transition of Care [facility]) - Progression Note    Patient Details  Name: Ian Sanders MRN: [id number] Date of Birth: 1940-12-10  Transition of Care [facility]) CM/SW Contact  Garret Reddish, RN Phone Number: 09/23/2023, 9:43 AM  Clinical Narrative:    Chart reviewed.   Received call from Tammy with [employer]. Tammy reports that SNF authorization has been approved for 7 days.  SNF authorization approved for 7 days.  Auth number is [id number].  Tammy has sent ambulance request to Medical Director for review. Tammy with [facility] is able to accept patient today.  Patient and his wife made aware.  Will need ambulance approval.    TOC will follow for discharge planning.            Expected Discharge Plan and Services                                               Social Determinants of Health (SDOH) Interventions SDOH Screenings   Food Insecurity: No Food Insecurity (09/18/2023)  Housing: Low Risk  (09/18/2023)  Transportation Needs: No Transportation Needs (09/18/2023)  Utilities: Not At Risk (09/18/2023)  Alcohol Screen: Low Risk  (01/13/2023)  Depression (PHQ2-9): Low Risk  (01/13/2023)  Financial Resource Strain: Low Risk  (01/13/2023)  Physical Activity: Inactive (01/13/2023)  Social Connections: Moderately Isolated (01/13/2023)  Stress: No Stress Concern Present (01/13/2023)  Tobacco Use: High Risk (09/18/2023)    Readmission Risk Interventions     No data to display

## 2023-09-23 NOTE — Care Management Important Message (Signed)
Important Message  Patient Details  Name: Ian Sanders MRN: [id number] Date of Birth: 01-25-1941   Important Message Given:  Yes - Medicare IM  Obtained signature on form and will submit to Health Information Management to be scanned into patient's medical record.   Olegario Messier A Cabria Micalizzi 09/23/2023, 12:54 PM

## 2023-09-23 NOTE — Plan of Care (Signed)
  Problem: Education: Goal: Knowledge of General Education information will improve Description: Including pain rating scale, medication(s)/side effects and non-pharmacologic comfort measures Outcome: Progressing   Problem: Health Behavior/Discharge Planning: Goal: Ability to manage health-related needs will improve Outcome: Progressing   Problem: Clinical Measurements: Goal: Ability to maintain clinical measurements within normal limits will improve Outcome: Progressing Goal: Will remain free from infection Outcome: Progressing Goal: Diagnostic test results will improve Outcome: Progressing Goal: Respiratory complications will improve Outcome: Progressing Goal: Cardiovascular complication will be avoided Outcome: Progressing   Problem: Activity: Goal: Risk for activity intolerance will decrease Outcome: Progressing   Problem: Nutrition: Goal: Adequate nutrition will be maintained Outcome: Progressing   Problem: Coping: Goal: Level of anxiety will decrease Outcome: Progressing   Problem: Pain Management: Goal: General experience of comfort will improve Outcome: Progressing   Problem: Safety: Goal: Ability to remain free from injury will improve Outcome: Progressing   Problem: Skin Integrity: Goal: Risk for impaired skin integrity will decrease Outcome: Progressing

## 2023-09-23 NOTE — TOC Progression Note (Addendum)
Transition of Care [facility]) - Progression Note    Patient Details  Name: Ian Sanders MRN: [id number] Date of Birth: [birth year]  Transition of Care [facility]) CM/SW Contact  Marlowe Sax, RN Phone Number: 09/23/2023, 12:43 PM  Clinical Narrative:     [name] the patient's spouse and let her know that the patient will DC to [facility] room 801 today, they need EMS transport,  I explained to the wife that Ins did not approve EMS, she stated she will transport him        Expected Discharge Plan and Services         Expected Discharge Date: 09/23/23                                     Social Determinants of Health (SDOH) Interventions SDOH Screenings   Food Insecurity: No Food Insecurity (09/18/2023)  Housing: Low Risk  (09/18/2023)  Transportation Needs: No Transportation Needs (09/18/2023)  Utilities: Not At Risk (09/18/2023)  Alcohol Screen: Low Risk  (01/13/2023)  Depression (PHQ2-9): Low Risk  (01/13/2023)  Financial Resource Strain: Low Risk  (01/13/2023)  Physical Activity: Inactive (01/13/2023)  Social Connections: Moderately Isolated (01/13/2023)  Stress: No Stress Concern Present (01/13/2023)  Tobacco Use: High Risk (09/18/2023)    Readmission Risk Interventions     No data to display

## 2023-09-23 NOTE — Discharge Summary (Signed)
Physician Discharge Summary  Patient: Ian Sanders WGN:[id number] DOB: 1941-10-28   Code Status: Full Code Admit date: 09/18/2023 Discharge date: 09/23/2023 Disposition: Skilled nursing facility, PT, OT, nurse aid, RN, and wound care PCP: Malva Limes, MD  Recommendations for Outpatient Follow-up:  Follow up with PCP within 1-2 weeks Regarding general hospital follow up and preventative care Recommend rechecking sodium level as well as blood pressure as chlorthalidone was discontinued for hyponatremia. Last Na+ was 129.  Follow up with ortho surgery in 2-3 weeks for ankle fracture follow up  Discharge Diagnoses:  Principal Problem:   Cellulitis of left foot Active Problems:   Pancytopenia (HCC)   Cellulitis   Generalized weakness   Hypokalemia   Hyponatremia   Recurrent falls  Brief Hospital Course Summary: Ian Sanders is a 82 y.o. male with medical history significant of PAF not on anticoagulation, HTN, TIA stroke, cirrhosis with chronic thrombocytopenia. They presented with worsening of left leg rash and pain and generalized weakness.   Patient sustained a mechanical fall 7 days ago, resulting in left distal fibula fracture in the case was reviewed by podiatry Dr. Allena Katz and patient was considered to be stable and sent home with cam boot and ambulating on a walker. Because of worsening pain and "rash" he returned to the ED.    ED Course: Afebrile, nontachycardic blood pressure 120/57 not hypoxic.  Blood work showed WBC 4.9, hemoglobin 11.2, K3.1, sodium 130, platelet 95.  COVID-negative LE doppler negative for DVT.    Patient was started on cefadroxil for cellulitis of left foot. He was transitioned to ceftriaxone and completed 5 days of treatment. His cellulitis resolved, negative for leukocytosis or fever. Has significant ecchymosis residual from the injury PTA.   He was evaluated by PT/OT who recommended SNF at discharge. Will need to use cam boot while ambulating  until ortho follow up.   Of note, patient has chronic hyponatremia and was also on chlorthalidone. This medication was discontinued and his Na+ remained stable around 130. Blood pressures were 120-140s systolics.   All other chronic conditions were treated with home medications.   Discharge Condition: Good, improved Recommended discharge diet: Regular healthy diet  Consultations: None   Procedures/Studies: None   Allergies as of 09/23/2023       Reactions   Lisinopril Swelling   Tongue swelling   Myrbetriq [mirabegron] Swelling   Of the tongue   Beta Adrenergic Blockers    Other Reaction(s): Other (See Comments) Symptomatic bradycardia Patient has been tolerating Propranolol 10 mg twice daily since 10/2022, with no complaints   Other Other (See Comments)   Symptomatic bradycardia    Triamterene    Other reaction(s): ITCHING,WATERING EYES Other Reaction(s): ITCHING,WATERING EYES   Amlodipine Other (See Comments)   Peripheral edema   Atorvastatin Other (See Comments)   Dizziness and Fatigue   Dabigatran Etexilate Mesylate Other (See Comments)   Stomach ulcers (Pradaxa)   Dabigatran Etexilate Mesylate Other (See Comments)   Stomach ulcers (Pradaxa)   Latex Rash        Medication List     STOP taking these medications    chlorthalidone 25 MG tablet Commonly known as: HYGROTON   traMADol 50 MG tablet Commonly known as: ULTRAM       TAKE these medications    Animi-3 1 MG Caps Take 1 capsule by mouth 1 day or 1 dose.   calcium-vitamin D 250-125 MG-UNIT tablet Commonly known as: OSCAL Take 1 tablet by mouth 2 (  two) times daily.   cyanocobalamin 1000 MCG tablet Take 1 tablet by mouth daily.   cyclobenzaprine 5 MG tablet Commonly known as: FLEXERIL Take 1 tablet (5 mg total) by mouth 3 (three) times daily as needed for muscle spasms. What changed:  medication strength See the new instructions.   diltiazem 120 MG 24 hr capsule Commonly known as:  CARDIZEM CD Take 120 mg by mouth daily.   Ferrous Sulfate 134 MG Tabs Take 1 tablet by mouth daily.   flecainide 50 MG tablet Commonly known as: TAMBOCOR Take 1 tablet (50 mg total) by mouth 2 (two) times daily.   gabapentin 100 MG capsule Commonly known as: NEURONTIN Take 1 capsule (100 mg total) by mouth 2 (two) times daily. What changed:  how much to take how to take this when to take this additional instructions   gabapentin 300 MG capsule Commonly known as: NEURONTIN Take 1 capsule (300 mg total) by mouth at bedtime. What changed: You were already taking a medication with the same name, and this prescription was added. Make sure you understand how and when to take each.   Gemtesa 75 MG Tabs Generic drug: Vibegron Take 1 tablet (75 mg total) by mouth daily.   Leuprolide Acetate (6 Month) 45 MG injection Commonly known as: LUPRON Inject 45 mg into the muscle every 6 (six) months.   losartan 25 MG tablet Commonly known as: COZAAR Take 1 tablet by mouth 2 (two) times daily.   meclizine 25 MG tablet Commonly known as: ANTIVERT Take 1 tablet by mouth daily.   omeprazole 40 MG capsule Commonly known as: PRILOSEC Take 40 mg by mouth daily.   oxyCODONE-acetaminophen 5-325 MG tablet Commonly known as: Percocet Take 1 tablet by mouth every 4 (four) hours as needed for up to 5 days for severe pain (pain score 7-10).   polyvinyl alcohol 1.4 % ophthalmic solution Commonly known as: LIQUIFILM TEARS 1 drop as needed.   propranolol 10 MG tablet Commonly known as: INDERAL Take 1 tablet by mouth 2 (two) times daily.   trospium 20 MG tablet Commonly known as: SANCTURA Take 1 tablet (20 mg total) by mouth 2 (two) times daily.   zolpidem 10 MG tablet Commonly known as: AMBIEN TAKE ONE-HALF TO ONE TABLET AT BEDTIME.       Subjective   Pt reports feeling well today. He has no pain at rest. Concerned about the discoloration of the top of his left foot and we discussed  that it is bruising. Has good blood flow and temp.   All questions and concerns were addressed at time of discharge.  Objective  Blood pressure (!) 144/72, pulse 68, temperature 98.7 F (37.1 C), resp. rate 17, SpO2 94%.   General: Pt is alert, awake, not in acute distress Cardiovascular: RRR, S1/S2 +, no rubs, no gallops Respiratory: CTA bilaterally, no wheezing, no rhonchi Abdominal: Soft, NT, ND, bowel sounds + Extremities: ecchymosis dorsal left foot. Mild swelling. Tender to palpation around bruising.   The results of significant diagnostics from this hospitalization (including imaging, microbiology, ancillary and laboratory) are listed below for reference.   Imaging studies: US Venous Img Lower Bilateral (DVT)  Result Date: 09/18/2023 CLINICAL DATA:  [id number] Swelling [id number] EXAM: BILATERAL LOWER EXTREMITY VENOUS DOPPLER ULTRASOUND TECHNIQUE: Gray-scale sonography with graded compression, as well as color Doppler and duplex ultrasound were performed to evaluate the lower extremity deep venous systems from the level of the common femoral vein and including the common femoral, femoral, profunda femoral,  popliteal and calf veins including the posterior tibial, peroneal and gastrocnemius veins when visible. Spectral Doppler was utilized to evaluate flow at rest and with distal augmentation maneuvers in the common femoral, femoral and popliteal veins. COMPARISON:  None Available. FINDINGS: RIGHT LOWER EXTREMITY Common Femoral Vein: No evidence of thrombus. Normal compressibility, respiratory phasicity and response to augmentation. Saphenofemoral Junction: No evidence of thrombus. Normal compressibility and flow on color Doppler imaging. Profunda Femoral Vein: No evidence of thrombus. Normal compressibility and flow on color Doppler imaging. Femoral Vein: No evidence of thrombus. Normal compressibility, respiratory phasicity and response to augmentation. Popliteal Vein: No evidence of thrombus. Normal  compressibility, respiratory phasicity and response to augmentation. Calf Veins: No evidence of thrombus. Normal compressibility and flow on color Doppler imaging. Other Findings: Small nondistended simple appearing right popliteal fossa Baker's cyst measures 2.9 x 0.7 x 1.5 cm. LEFT LOWER EXTREMITY Common Femoral Vein: No evidence of thrombus. Normal compressibility, respiratory phasicity and response to augmentation. Saphenofemoral Junction: No evidence of thrombus. Normal compressibility and flow on color Doppler imaging. Profunda Femoral Vein: No evidence of thrombus. Normal compressibility and flow on color Doppler imaging. Femoral Vein: No evidence of thrombus. Normal compressibility, respiratory phasicity and response to augmentation. Popliteal Vein: No evidence of thrombus. Normal compressibility, respiratory phasicity and response to augmentation. Calf Veins: No evidence of thrombus. Normal compressibility and flow on color Doppler imaging. Other Findings: Small nondistended complex left popliteal fossa Baker's cyst with internal debris or hemorrhage measures 3.8 x 3.9 x 1.5 cm IMPRESSION: 1. No evidence of deep venous thrombosis in either lower extremity. 2. Small bilateral popliteal fossa Baker's cysts as above. Electronically Signed   By: Judie Petit.  Shick M.D.   On: 09/18/2023 13:57   DG Chest Port 1 View  Result Date: 09/18/2023 CLINICAL DATA:  Weakness, fever EXAM: PORTABLE CHEST 1 VIEW COMPARISON:  07/13/2021 FINDINGS: Mild elevation of the right hemidiaphragm, progressive since prior examination. Bibasilar atelectasis. No confluent pulmonary infiltrate. No pneumothorax or pleural effusion. Cardiac size within normal limits. Pulmonary vascularity is normal. No acute bone abnormality. IMPRESSION: 1. Mild elevation of the right hemidiaphragm, progressive since prior examination. Bibasilar atelectasis. Electronically Signed   By: Helyn Numbers M.D.   On: 09/18/2023 03:36   DG Ankle Complete  Left  Result Date: 09/11/2023 CLINICAL DATA:  Witnessed fall while at home. EXAM: LEFT ANKLE COMPLETE - 3+ VIEW COMPARISON:  None Available. FINDINGS: There is an acute, obliquely oriented fracture involving the distal left fibula. There is slight lateral displacement of the distal fracture fragments. Small, curvilinear os ossific density adjacent to the anterior process of the calcaneus is noted. This may represent a small avulsion fracture. Tiny ossific density adjacent to the tip of the medial malleolus appears well corticated. This may represent sequelae of age remote injury. Diffuse soft tissue edema. IMPRESSION: 1. Acute, obliquely oriented fracture involving the distal left fibula. 2. Small, curvilinear ossific density adjacent to the anterior process of the calcaneus may represent a small avulsion fracture. Electronically Signed   By: Signa Kell M.D.   On: 09/11/2023 11:50   US Abdomen Complete  Result Date: 09/03/2023 CLINICAL DATA:  Cirrhosis, splenomegaly. EXAM: ABDOMEN ULTRASOUND COMPLETE COMPARISON:  September 30, 2022 FINDINGS: Gallbladder: Gallstones fill gallbladder. No wall thickening visualized. No sonographic Murphy sign noted by sonographer. Common bile duct: Diameter: 5 mm. Liver: No focal lesion identified. Increased echotexture. Portal vein is patent on color Doppler imaging with normal direction of blood flow towards the liver. IVC: No abnormality visualized.  Pancreas: Limited visualization per ultrasound technologist. Spleen: The spleen is enlarged measuring 17.4 cm. A 0.7 cm echogenic focus is identified within the spleen. Right Kidney: Length: 10.5 cm. Echogenicity within normal limits. No hydronephrosis visualized. Simple cysts are identified within the right kidney, largest measures 2.6 cm. No follow-up is recommended. Left Kidney: Length: 10.9 cm. Echogenicity within normal limits. No mass or hydronephrosis visualized. Abdominal aorta: No aneurysm visualized. Other findings:  Trace free fluid noted around the liver. Question midline varicosities noted anterior to the pancreas with internal thrombus. IMPRESSION: 1. Cirrhosis of liver. No focal liver lesion identified. 2. Trace free fluid noted around the liver. 3. Splenomegaly. 4. Question midline varicosities noted anterior to the pancreas with internal thrombus. Electronically Signed   By: Sherian Rein M.D.   On: 09/03/2023 10:45    Labs: Basic Metabolic Panel: Recent Labs  Lab 09/18/23 0318 09/20/23 0421 09/21/23 0601 09/22/23 0808  NA 130* 132* 129* 129*  K 3.1* 3.5 3.6 3.6  CL 93* 96* 95* 94*  CO2 28 28 25 26   GLUCOSE 118* 109* 100* 97  BUN 15 13 14 16   CREATININE 0.81 0.70 0.67 0.58*  CALCIUM 8.6* 8.3* 8.5* 8.2*  MG 1.7  --   --   --    CBC: Recent Labs  Lab 09/18/23 0318 09/20/23 0421 09/21/23 0601  WBC 4.9 2.0* 3.3*  NEUTROABS 3.7  --   --   HGB 11.2* 9.8* 10.6*  HCT 33.8* 28.9* 31.3*  MCV 80.1 77.9* 77.7*  PLT 95* 81* 89*   Microbiology: Results for orders placed or performed during the hospital encounter of 09/18/23  Resp panel by RT-PCR (RSV, Flu A&B, Covid) Anterior Nasal Swab     Status: None   Collection Time: 09/18/23  3:18 AM   Specimen: Anterior Nasal Swab  Result Value Ref Range Status   SARS Coronavirus 2 by RT PCR NEGATIVE NEGATIVE Final    Comment: (NOTE) SARS-CoV-2 target nucleic acids are NOT DETECTED.  The SARS-CoV-2 RNA is generally detectable in upper respiratory specimens during the acute phase of infection. The lowest concentration of SARS-CoV-2 viral copies this assay can detect is 138 copies/mL. A negative result does not preclude SARS-Cov-2 infection and should not be used as the sole basis for treatment or other patient management decisions. A negative result may occur with  improper specimen collection/handling, submission of specimen other than nasopharyngeal swab, presence of viral mutation(s) within the areas targeted by this assay, and inadequate  number of viral copies(<138 copies/mL). A negative result must be combined with clinical observations, patient history, and epidemiological information. The expected result is Negative.  Fact Sheet for Patients:  BloggerCourse.com  Fact Sheet for Healthcare Providers:  SeriousBroker.it  This test is no t yet approved or cleared by the [place] FDA and  has been authorized for detection and/or diagnosis of SARS-CoV-2 by FDA under an Emergency Use Authorization (EUA). This EUA will remain  in effect (meaning this test can be used) for the duration of the COVID-19 declaration under Section 564(b)(1) of the Act, 21 U.S.C.section 360bbb-3(b)(1), unless the authorization is terminated  or revoked sooner.       Influenza A by PCR NEGATIVE NEGATIVE Final   Influenza B by PCR NEGATIVE NEGATIVE Final    Comment: (NOTE) The Xpert Xpress SARS-CoV-2/FLU/RSV plus assay is intended as an aid in the diagnosis of influenza from Nasopharyngeal swab specimens and should not be used as a sole basis for treatment. Nasal washings and aspirates are  unacceptable for Xpert Xpress SARS-CoV-2/FLU/RSV testing.  Fact Sheet for Patients: BloggerCourse.com  Fact Sheet for Healthcare Providers: SeriousBroker.it  This test is not yet approved or cleared by the [place] FDA and has been authorized for detection and/or diagnosis of SARS-CoV-2 by FDA under an Emergency Use Authorization (EUA). This EUA will remain in effect (meaning this test can be used) for the duration of the COVID-19 declaration under Section 564(b)(1) of the Act, 21 U.S.C. section 360bbb-3(b)(1), unless the authorization is terminated or revoked.     Resp Syncytial Virus by PCR NEGATIVE NEGATIVE Final    Comment: (NOTE) Fact Sheet for Patients: BloggerCourse.com  Fact Sheet for Healthcare  Providers: SeriousBroker.it  This test is not yet approved or cleared by the [place] FDA and has been authorized for detection and/or diagnosis of SARS-CoV-2 by FDA under an Emergency Use Authorization (EUA). This EUA will remain in effect (meaning this test can be used) for the duration of the COVID-19 declaration under Section 564(b)(1) of the Act, 21 U.S.C. section 360bbb-3(b)(1), unless the authorization is terminated or revoked.  Performed at [facility], [address] [place], [place] [address]   Group A Strep by PCR     Status: None   Collection Time: 09/18/23  7:57 AM   Specimen: Nasal Mucosa; Sterile Swab  Result Value Ref Range Status   Group A Strep by PCR NOT DETECTED NOT DETECTED Final    Comment: Performed at [facility], [address] [place], [place] [address]  MRSA Next Gen by PCR, Nasal     Status: None   Collection Time: 09/18/23  8:59 AM   Specimen: Nasal Mucosa; Nasal Swab  Result Value Ref Range Status   MRSA by PCR Next Gen NOT DETECTED NOT DETECTED Final    Comment: (NOTE) The GeneXpert MRSA Assay (FDA approved for NASAL specimens only), is one component of a comprehensive MRSA colonization surveillance program. It is not intended to diagnose MRSA infection nor to guide or monitor treatment for MRSA infections. Test performance is not FDA approved in patients less than 61 years old. Performed at [facility], [address], [place], [place] [address]    Time coordinating discharge: Over 30 minutes  Leeroy Bock, MD  [facility] 09/23/2023, 10:51 AM

## 2023-09-23 NOTE — Plan of Care (Signed)

## 2023-09-24 DIAGNOSIS — L03116 Cellulitis of left lower limb: Secondary | ICD-10-CM | POA: Diagnosis not present

## 2023-09-24 DIAGNOSIS — K219 Gastro-esophageal reflux disease without esophagitis: Secondary | ICD-10-CM | POA: Diagnosis not present

## 2023-09-24 DIAGNOSIS — N3941 Urge incontinence: Secondary | ICD-10-CM | POA: Diagnosis not present

## 2023-09-24 DIAGNOSIS — I10 Essential (primary) hypertension: Secondary | ICD-10-CM | POA: Diagnosis not present

## 2023-09-27 DIAGNOSIS — R42 Dizziness and giddiness: Secondary | ICD-10-CM | POA: Diagnosis not present

## 2023-09-27 DIAGNOSIS — L03116 Cellulitis of left lower limb: Secondary | ICD-10-CM | POA: Diagnosis not present

## 2023-09-28 DIAGNOSIS — L03116 Cellulitis of left lower limb: Secondary | ICD-10-CM | POA: Diagnosis not present

## 2023-09-29 DIAGNOSIS — H353211 Exudative age-related macular degeneration, right eye, with active choroidal neovascularization: Secondary | ICD-10-CM | POA: Diagnosis not present

## 2023-09-29 DIAGNOSIS — H353132 Nonexudative age-related macular degeneration, bilateral, intermediate dry stage: Secondary | ICD-10-CM | POA: Diagnosis not present

## 2023-09-29 DIAGNOSIS — M3501 Sicca syndrome with keratoconjunctivitis: Secondary | ICD-10-CM | POA: Diagnosis not present

## 2023-09-29 DIAGNOSIS — E871 Hypo-osmolality and hyponatremia: Secondary | ICD-10-CM | POA: Diagnosis not present

## 2023-09-29 DIAGNOSIS — H40003 Preglaucoma, unspecified, bilateral: Secondary | ICD-10-CM | POA: Diagnosis not present

## 2023-09-30 DIAGNOSIS — S82402D Unspecified fracture of shaft of left fibula, subsequent encounter for closed fracture with routine healing: Secondary | ICD-10-CM | POA: Diagnosis not present

## 2023-09-30 DIAGNOSIS — K219 Gastro-esophageal reflux disease without esophagitis: Secondary | ICD-10-CM | POA: Diagnosis not present

## 2023-10-01 ENCOUNTER — Telehealth: Payer: Self-pay | Admitting: Family Medicine

## 2023-10-01 DIAGNOSIS — R52 Pain, unspecified: Secondary | ICD-10-CM | POA: Diagnosis not present

## 2023-10-01 IMAGING — US US ABDOMEN LIMITED
1 series · 14 of 25 positions shown · non-contrast
Comparison: None Available.

CLINICAL DATA: Cirrhosis

Thrombocytopenia
EXAM:
ULTRASOUND ABDOMEN LIMITED RIGHT UPPER QUADRANT

[Series 1: us abdomen limited ruq (liver/gb) · 14 of 53 slices shown]
[im 1/53]
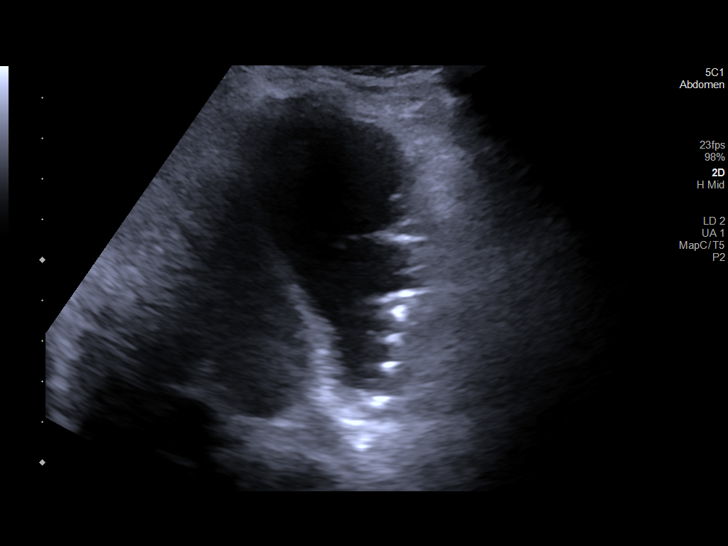
[im 5/53]
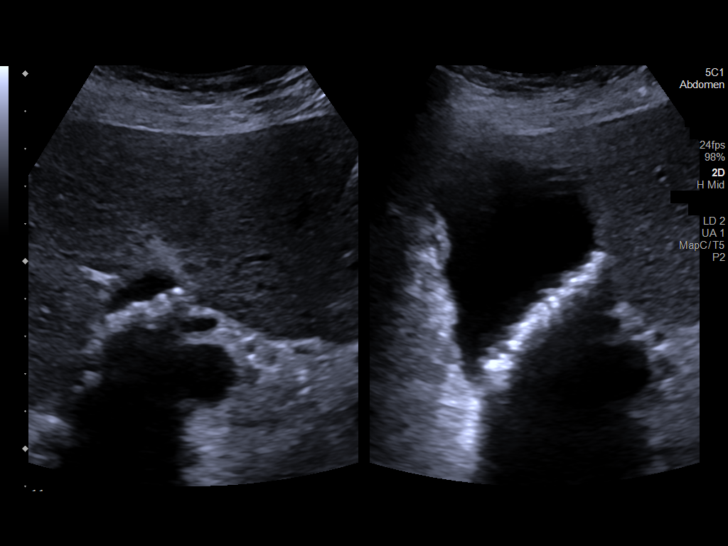
[im 9/53]
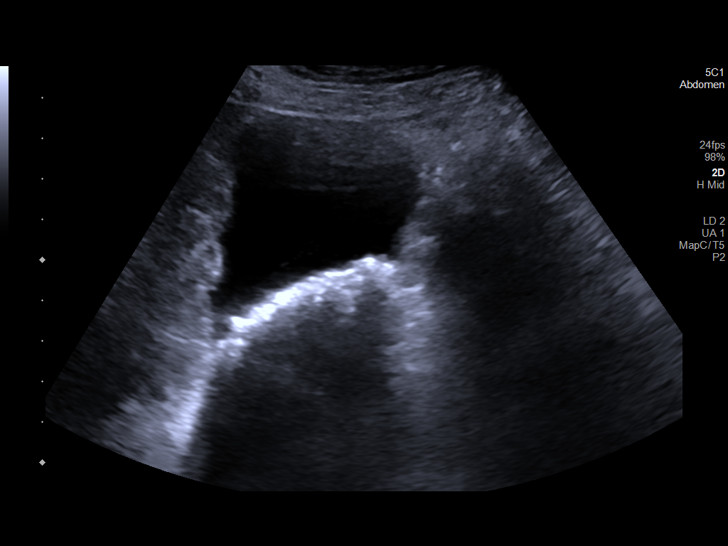
[im 14/53]
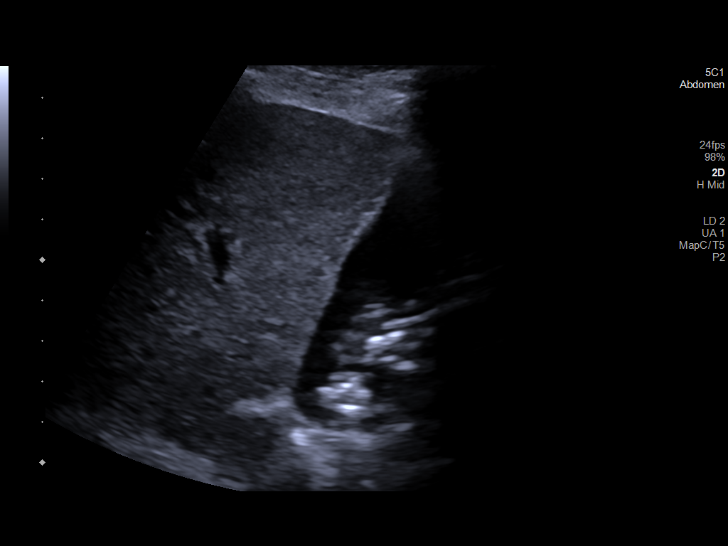
[im 18/53]
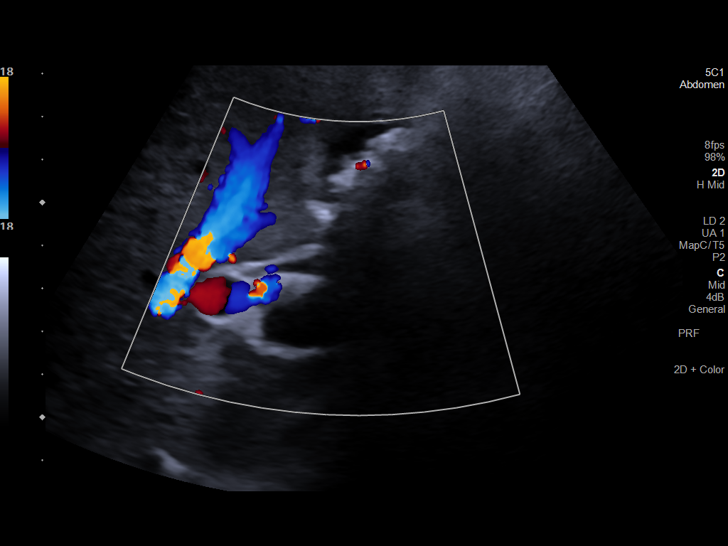
[im 20/53]
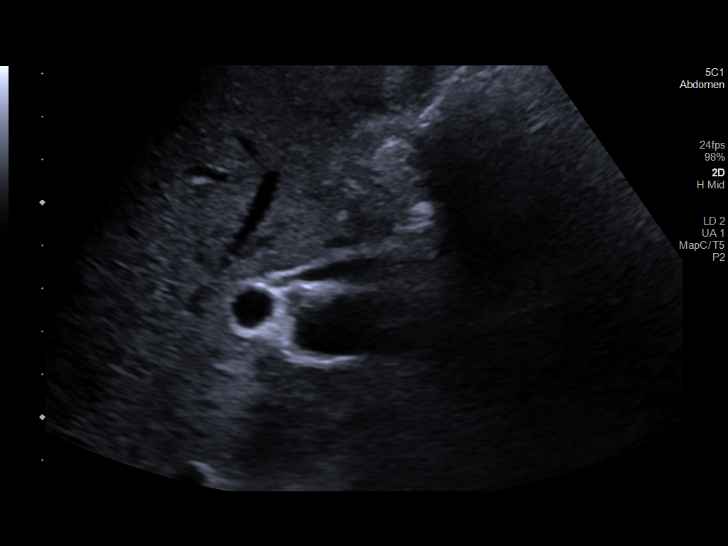
[im 24/53]
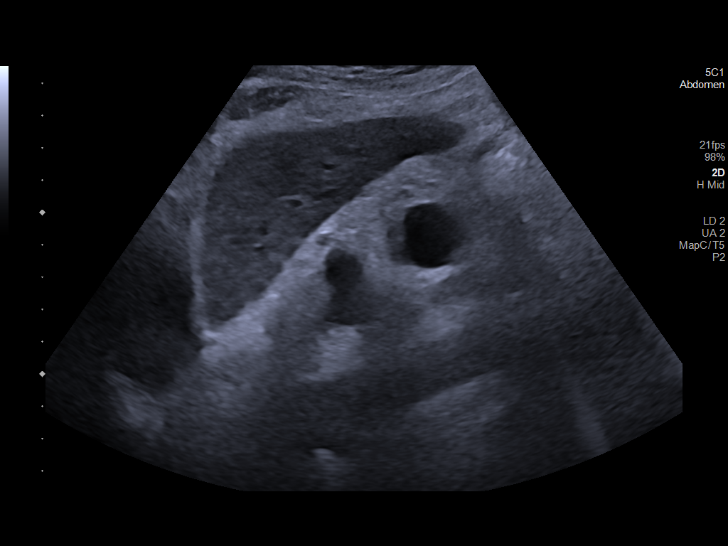
[im 29/53]
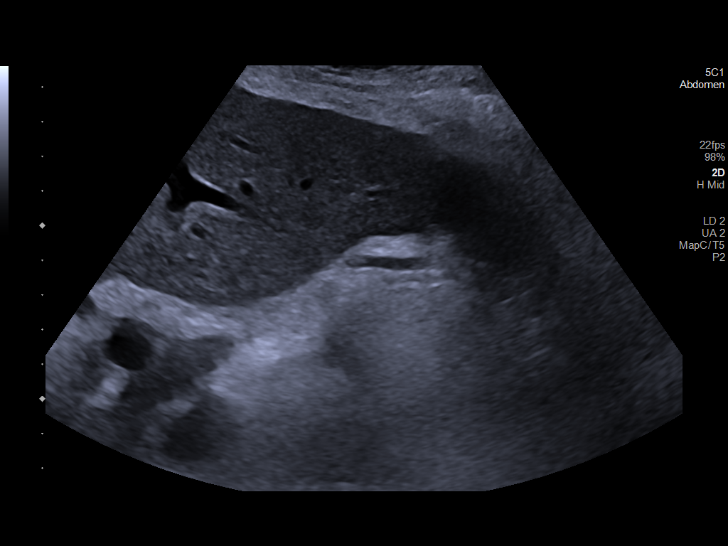
[im 33/53]
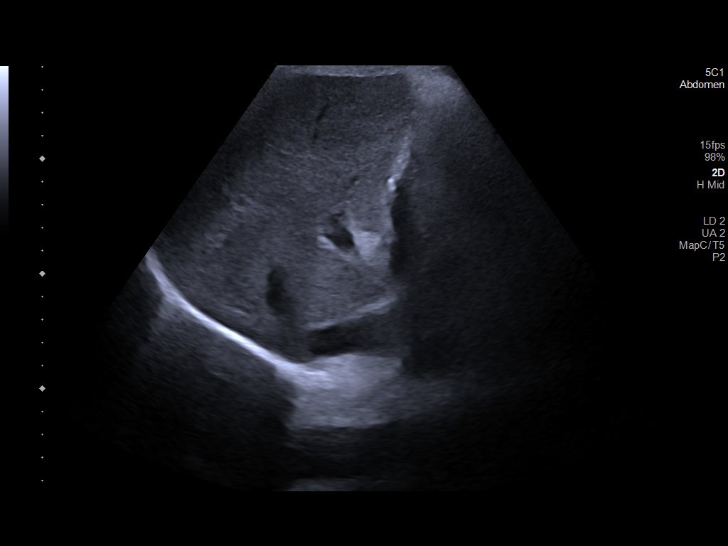
[im 35/53]
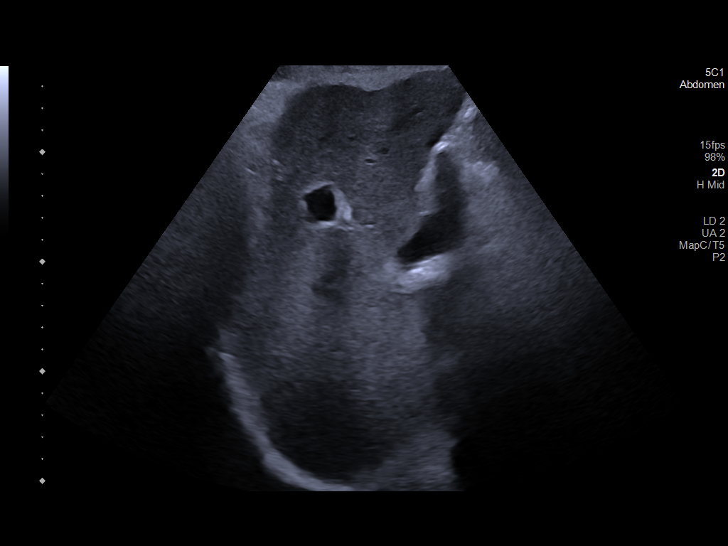
[im 40/53]
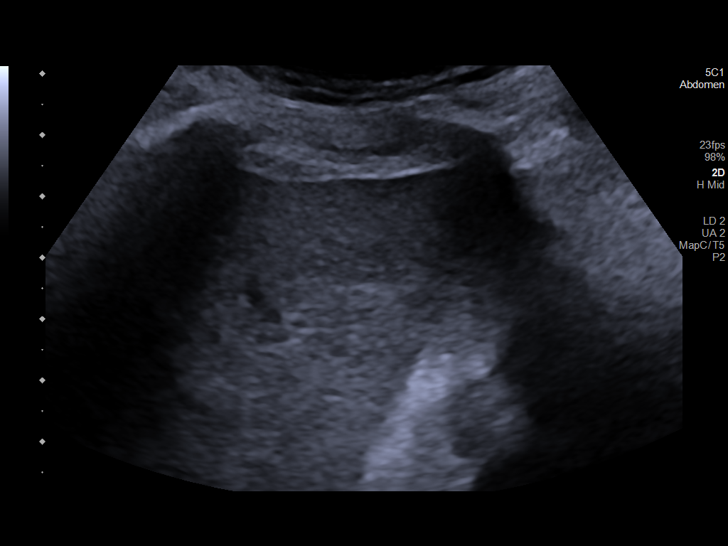
[im 44/53]
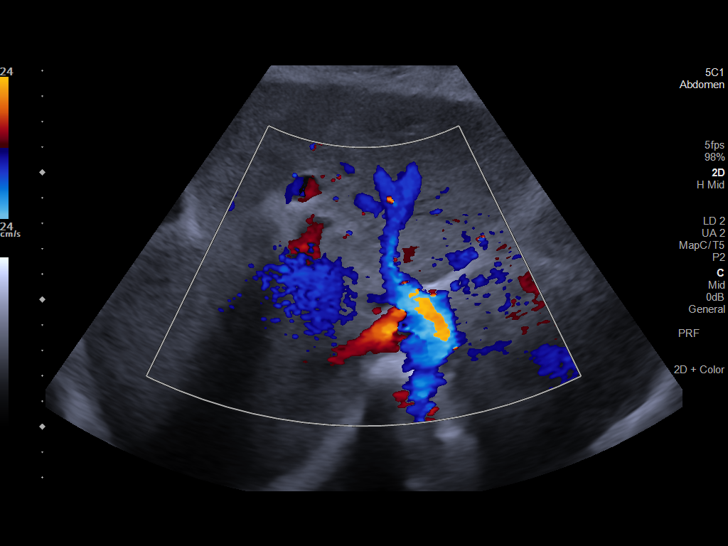
[im 48/53]
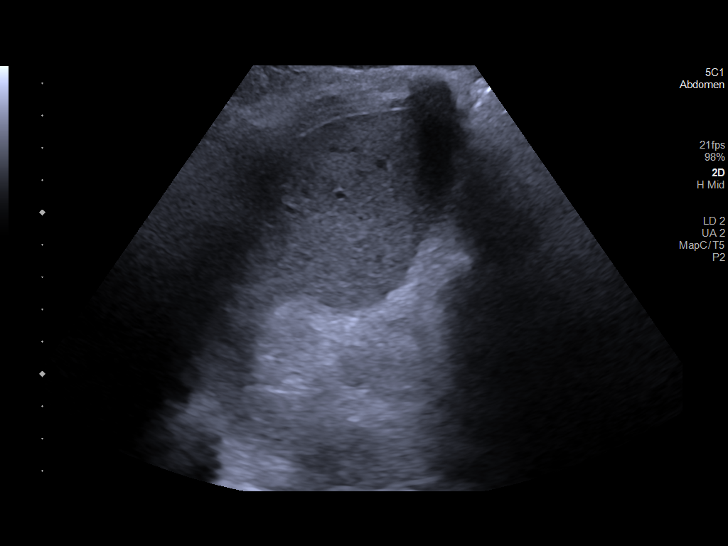
[im 53/53]
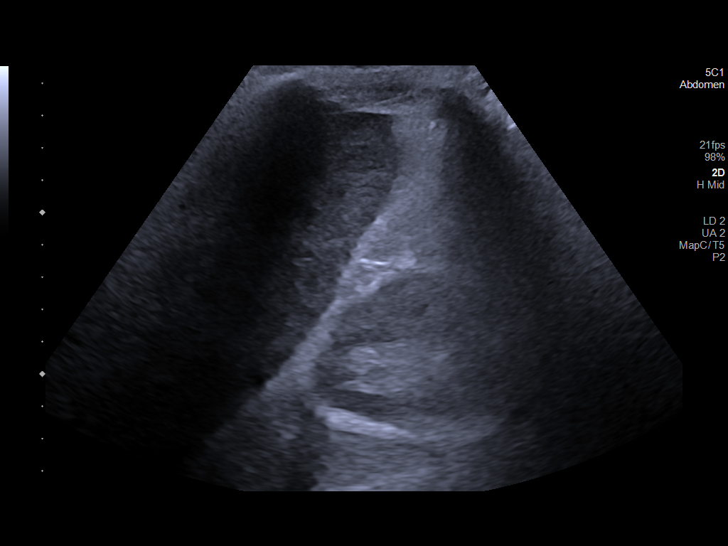

[14 of 25 positions shown; findings below may reference images not displayed]

FINDINGS: Gallbladder:

Gallstones: Present

Sludge: None

Gallbladder Wall: Within normal limits

Pericholecystic fluid: None

Sonographic Murphy's Sign: Negative per technologist

Common bile duct:

Diameter: 3 mm

Liver:

Parenchymal echogenicity: Within normal limits

Contours: Nodular

Lesions: None

Portal vein: Patent.  Hepatopetal flow

Other: None.
IMPRESSION: 1.  Cirrhotic liver morphology without focal hepatic lesion.
2. Cholelithiasis without additional sonographic evidence of acute
cholecystitis.

## 2023-10-01 NOTE — Telephone Encounter (Signed)
LVMTCB. CRM created. Ok for [facility] to advise/clarify.

## 2023-10-01 NOTE — Telephone Encounter (Signed)
Where do they want to get it from? I don't mind sending order, but I don't know if it will be covered and how long it will take. Again, I strongly suggest they talk more to either the social worker, the physical therapist ,or the attending physician at [facility].

## 2023-10-01 NOTE — Telephone Encounter (Signed)
Victorino Dike patients daughter in law called to see if provider would put in an order for patient to get a hospital bed as he will be leaving [facility] on Monday possibly. Please f/u with Victorino Dike

## 2023-10-01 NOTE — Telephone Encounter (Signed)
The social worker and therapist should arrange this. It will be much faster that way. It usually takes a few weeks to get a bed if we order as an outpatient.

## 2023-10-01 NOTE — Telephone Encounter (Signed)
Pt's daughter in law, called in states that social worker at [facility], wont do order for a hosp bed, because of the dx code that patient has, but she says he really needs it.He can't help well and she wants t know can we talk to the social worker at the facility about it.

## 2023-10-04 ENCOUNTER — Telehealth: Payer: Self-pay

## 2023-10-04 DIAGNOSIS — I10 Essential (primary) hypertension: Secondary | ICD-10-CM | POA: Diagnosis not present

## 2023-10-04 DIAGNOSIS — K219 Gastro-esophageal reflux disease without esophagitis: Secondary | ICD-10-CM | POA: Diagnosis not present

## 2023-10-04 NOTE — Telephone Encounter (Signed)
DME sent to [employer] through the community message.

## 2023-10-04 NOTE — Telephone Encounter (Signed)
Per daughter patient was here and saw Dr. Payton Mccallum. Reports Dr. Payton Mccallum looked up a company and maybe an order was placed.

## 2023-10-04 NOTE — Telephone Encounter (Signed)
Resend community message today

## 2023-10-04 NOTE — Telephone Encounter (Signed)
Copied from CRM [id number]. Topic: General - Other >> Oct 04, 2023  1:11 PM Phill Myron wrote: Attn: Marjie Skiff, CMA Per Su Hoff, for medical equipment, if we can go with [facility].

## 2023-10-06 ENCOUNTER — Ambulatory Visit: Payer: PPO | Admitting: Podiatry

## 2023-10-06 ENCOUNTER — Encounter: Payer: Self-pay | Admitting: Podiatry

## 2023-10-06 ENCOUNTER — Ambulatory Visit (INDEPENDENT_AMBULATORY_CARE_PROVIDER_SITE_OTHER): Payer: PPO

## 2023-10-06 DIAGNOSIS — E559 Vitamin D deficiency, unspecified: Secondary | ICD-10-CM

## 2023-10-06 DIAGNOSIS — S8262XA Displaced fracture of lateral malleolus of left fibula, initial encounter for closed fracture: Secondary | ICD-10-CM

## 2023-10-06 DIAGNOSIS — M779 Enthesopathy, unspecified: Secondary | ICD-10-CM | POA: Diagnosis not present

## 2023-10-06 NOTE — Patient Instructions (Signed)
VISIT SUMMARY:  You visited [place] today for a follow-up after your fall four weeks ago, which resulted in a minimally displaced fracture of your lateral fibula. You have been experiencing some discomfort and swelling, but overall, the healing process is progressing well. We also discussed your low calcium levels, which could affect your bone healing.  YOUR PLAN:  -WEBER B FRACTURE OF THE LATERAL FIBULA: A Weber B fracture is a type of break in the fibula, the smaller bone in your lower leg, near the ankle. Your fracture is healing well without the need for surgery. You should continue wearing the boot for support for another four weeks and use a compression sleeve to help with swelling. We will see you again for a follow-up and new x-rays on 11/03/2023.  -HYPOCALCEMIA: Hypocalcemia means you have low levels of calcium in your blood, which is important for bone health. We will recheck your calcium and vitamin D levels with lab work and adjust your supplements as needed to help with your bone healing.  INSTRUCTIONS:  Please continue wearing your boot and using the compression sleeve as advised. We will see you for a follow-up appointment and new x-rays on 11/03/2023. Additionally, we will recheck your calcium and vitamin D levels with lab work to adjust your supplements if necessary.

## 2023-10-06 NOTE — Progress Notes (Signed)
  Subjective:  Patient ID: Ian Sanders, male    DOB: [birth year],  MRN: [id number]  Chief Complaint  Patient presents with   Foot Pain    "I have a broke foot."    Discussed the use of AI scribe software for clinical note transcription with the patient, who gave verbal consent to proceed.  History of Present Illness   The patient, an elderly individual, presents for a follow-up visit after a fall at home approximately four weeks ago. The fall resulted in a minimally displaced Weber B fracture of the lateral fibula. The patient reports that the pain and swelling have improved since the incident, but discomfort persists, particularly when pressure is applied to the area. The patient also reports that the fall caused pain to the big toe and the arch of the foot, which became inflamed and swollen, necessitating a return to the hospital for antibiotics. The patient has been wearing a boot for support and has been advised to continue doing so for at least another month. The patient's calcium level was found to be low, which could potentially slow the healing process and increase the risk of further fractures.          Objective:    Physical Exam   EXTREMITIES: Tenderness and swelling over lateral malleolus of left ankle. Residual bruising around toe sites on left foot.       No images are attached to the encounter.    Results   Procedure: Compression sleeve application Description: Compression sleeve applied to the left ankle to reduce swelling and provide support  LABS Calcium: 8.6 mg/dL (11/91/4782)  RADIOLOGY Left ankle x-ray: Minimally displaced Weber B fracture of the lateral fibula (09/11/2023)      Assessment:   1. Closed displaced fracture of lateral malleolus of left fibula, initial encounter   2. Vitamin D deficiency      Plan:  Patient was evaluated and treated and all questions answered.  Assessment and Plan    Weber B Fracture of the Lateral Fibula   Four  weeks post-injury, he exhibits a minimally displaced fracture of the lateral fibula with tenderness and swelling over the lateral malleolus. The fracture is healing properly and remains in the correct position, there is no widening of the mortise or syndesmosis with 3 weeks of weightbearing, I recommended closed non operative treatment for the injury. Do not think surgery would offer increased benefit over the risks of complications at this point. We will continue weight-bearing activities in the boot for an additional four weeks and apply a compression sleeve to mitigate swelling and enhance support. He is scheduled for a follow-up and new x-rays on 11/03/2023.  Hypocalcemia   His calcium level was low at 8.6 on 09/18/2023, despite current supplementation with calcium and vitamin D, indicating a potential need for dosage adjustment. We will order lab work to recheck calcium and vitamin D levels and adjust the supplementation accordingly based on the lab results.          Return in about 4 weeks (around 11/03/2023) for fracture follow up (new xrays).

## 2023-10-07 DIAGNOSIS — G4700 Insomnia, unspecified: Secondary | ICD-10-CM | POA: Diagnosis not present

## 2023-10-07 DIAGNOSIS — K219 Gastro-esophageal reflux disease without esophagitis: Secondary | ICD-10-CM | POA: Diagnosis not present

## 2023-10-07 LAB — CALCIUM: Calcium: 9.1 mg/dL (ref 8.6–10.2)

## 2023-10-07 LAB — VITAMIN D 25 HYDROXY (VIT D DEFICIENCY, FRACTURES): Vit D, 25-Hydroxy: 32.8 ng/mL (ref 30.0–100.0)

## 2023-10-08 DIAGNOSIS — I639 Cerebral infarction, unspecified: Secondary | ICD-10-CM | POA: Diagnosis not present

## 2023-10-08 DIAGNOSIS — R296 Repeated falls: Secondary | ICD-10-CM | POA: Diagnosis not present

## 2023-10-08 DIAGNOSIS — L03116 Cellulitis of left lower limb: Secondary | ICD-10-CM | POA: Diagnosis not present

## 2023-10-08 DIAGNOSIS — I10 Essential (primary) hypertension: Secondary | ICD-10-CM | POA: Diagnosis not present

## 2023-10-08 DIAGNOSIS — K219 Gastro-esophageal reflux disease without esophagitis: Secondary | ICD-10-CM | POA: Diagnosis not present

## 2023-10-08 DIAGNOSIS — R531 Weakness: Secondary | ICD-10-CM | POA: Diagnosis not present

## 2023-10-08 DIAGNOSIS — G4700 Insomnia, unspecified: Secondary | ICD-10-CM | POA: Diagnosis not present

## 2023-10-08 DIAGNOSIS — S82832D Other fracture of upper and lower end of left fibula, subsequent encounter for closed fracture with routine healing: Secondary | ICD-10-CM | POA: Diagnosis not present

## 2023-10-10 DIAGNOSIS — Z09 Encounter for follow-up examination after completed treatment for conditions other than malignant neoplasm: Secondary | ICD-10-CM | POA: Diagnosis not present

## 2023-10-11 ENCOUNTER — Ambulatory Visit: Payer: PPO | Admitting: Internal Medicine

## 2023-10-11 ENCOUNTER — Other Ambulatory Visit: Payer: PPO

## 2023-10-12 DIAGNOSIS — Z85828 Personal history of other malignant neoplasm of skin: Secondary | ICD-10-CM | POA: Diagnosis not present

## 2023-10-12 DIAGNOSIS — Z9842 Cataract extraction status, left eye: Secondary | ICD-10-CM | POA: Diagnosis not present

## 2023-10-12 DIAGNOSIS — I480 Paroxysmal atrial fibrillation: Secondary | ICD-10-CM | POA: Diagnosis not present

## 2023-10-12 DIAGNOSIS — Z8673 Personal history of transient ischemic attack (TIA), and cerebral infarction without residual deficits: Secondary | ICD-10-CM | POA: Diagnosis not present

## 2023-10-12 DIAGNOSIS — K219 Gastro-esophageal reflux disease without esophagitis: Secondary | ICD-10-CM | POA: Diagnosis not present

## 2023-10-12 DIAGNOSIS — Z5982 Transportation insecurity: Secondary | ICD-10-CM | POA: Diagnosis not present

## 2023-10-12 DIAGNOSIS — Z604 Social exclusion and rejection: Secondary | ICD-10-CM | POA: Diagnosis not present

## 2023-10-12 DIAGNOSIS — Z9181 History of falling: Secondary | ICD-10-CM | POA: Diagnosis not present

## 2023-10-12 DIAGNOSIS — Z974 Presence of external hearing-aid: Secondary | ICD-10-CM | POA: Diagnosis not present

## 2023-10-12 DIAGNOSIS — K5900 Constipation, unspecified: Secondary | ICD-10-CM | POA: Diagnosis not present

## 2023-10-12 DIAGNOSIS — Z9841 Cataract extraction status, right eye: Secondary | ICD-10-CM | POA: Diagnosis not present

## 2023-10-12 DIAGNOSIS — D696 Thrombocytopenia, unspecified: Secondary | ICD-10-CM | POA: Diagnosis not present

## 2023-10-12 DIAGNOSIS — N3281 Overactive bladder: Secondary | ICD-10-CM | POA: Diagnosis not present

## 2023-10-12 DIAGNOSIS — H9193 Unspecified hearing loss, bilateral: Secondary | ICD-10-CM | POA: Diagnosis not present

## 2023-10-12 DIAGNOSIS — Z556 Problems related to health literacy: Secondary | ICD-10-CM | POA: Diagnosis not present

## 2023-10-12 DIAGNOSIS — I2510 Atherosclerotic heart disease of native coronary artery without angina pectoris: Secondary | ICD-10-CM | POA: Diagnosis not present

## 2023-10-12 DIAGNOSIS — G589 Mononeuropathy, unspecified: Secondary | ICD-10-CM | POA: Diagnosis not present

## 2023-10-12 DIAGNOSIS — Z87891 Personal history of nicotine dependence: Secondary | ICD-10-CM | POA: Diagnosis not present

## 2023-10-12 DIAGNOSIS — H8110 Benign paroxysmal vertigo, unspecified ear: Secondary | ICD-10-CM | POA: Diagnosis not present

## 2023-10-12 DIAGNOSIS — I10 Essential (primary) hypertension: Secondary | ICD-10-CM | POA: Diagnosis not present

## 2023-10-12 DIAGNOSIS — L03115 Cellulitis of right lower limb: Secondary | ICD-10-CM | POA: Diagnosis not present

## 2023-10-12 DIAGNOSIS — E039 Hypothyroidism, unspecified: Secondary | ICD-10-CM | POA: Diagnosis not present

## 2023-10-12 DIAGNOSIS — K7460 Unspecified cirrhosis of liver: Secondary | ICD-10-CM | POA: Diagnosis not present

## 2023-10-12 DIAGNOSIS — Z9079 Acquired absence of other genital organ(s): Secondary | ICD-10-CM | POA: Diagnosis not present

## 2023-10-13 ENCOUNTER — Other Ambulatory Visit: Payer: Self-pay | Admitting: Family Medicine

## 2023-10-13 NOTE — Telephone Encounter (Signed)
Requested medication (s) are due for refill today: Yes  Requested medication (s) are on the active medication list: Yes  Last refill:  09/23/23  Future visit scheduled: No  Notes to clinic:  Unable to refill per protocol, last refill by another provider.      Requested Prescriptions  Pending Prescriptions Disp Refills   gabapentin (NEURONTIN) 100 MG capsule [Pharmacy Med Name: GABAPENTIN 100 MG CAPSULE] 450 capsule 0    Sig: Take one in the morning, one in the afternoon, and three at night     Neurology: Anticonvulsants - gabapentin Failed - 10/13/2023  4:26 PM      Failed - Cr in normal range and within 360 days    Creatinine  Date Value Ref Range Status  04/09/2023 0.79 0.61 - 1.24 mg/dL Final  52/84/1324 4.01 (H) 0.60 - 1.30 mg/dL Final   Creatinine, Ser  Date Value Ref Range Status  09/22/2023 0.58 (L) 0.61 - 1.24 mg/dL Final   Creatinine, POC  Date Value Ref Range Status  04/27/2017 n/a mg/dL Final         Passed - Completed PHQ-2 or PHQ-9 in the last 360 days      Passed - Valid encounter within last 12 months    Recent Outpatient Visits           4 weeks ago Closed fracture of distal end of left fibula with routine healing, unspecified fracture morphology, subsequent encounter   [facility] [place], Monico Blitz, DO   5 months ago Primary hypertension   [facility] Malva Limes, MD   11 months ago Right groin pain   [facility] Malva Limes, MD   1 year ago Insomnia, unspecified type   [facility] [place], [place], PA-C   1 year ago Primary hypertension    [facility] Malva Limes, MD       Future Appointments             In 3 months [place], [facility] [place]

## 2023-10-21 DIAGNOSIS — L03116 Cellulitis of left lower limb: Secondary | ICD-10-CM | POA: Diagnosis not present

## 2023-10-22 ENCOUNTER — Other Ambulatory Visit: Payer: Self-pay | Admitting: Family Medicine

## 2023-10-25 ENCOUNTER — Telehealth: Payer: Self-pay

## 2023-10-25 ENCOUNTER — Other Ambulatory Visit: Payer: Self-pay | Admitting: Family Medicine

## 2023-10-25 DIAGNOSIS — G4700 Insomnia, unspecified: Secondary | ICD-10-CM

## 2023-10-25 NOTE — Telephone Encounter (Signed)
Appt made for a mychart visit on 10/26/2023 at 11am

## 2023-10-25 NOTE — Telephone Encounter (Signed)
Copied from CRM [id number]. Topic: Appointments - Appointment Scheduling >> Oct 25, 2023 11:32 AM Phill Myron wrote: Pt Ian Sanders said he can do face time if Dr Sherrie Mustache can do that, for him to get a sooner appt.  Currently he is injured and wearing a boot and can not travel at this time. Please advise

## 2023-10-25 NOTE — Telephone Encounter (Signed)
Virtual visit Tuesday is fine

## 2023-10-25 NOTE — Telephone Encounter (Signed)
Virtual visit is fine  

## 2023-10-25 NOTE — Telephone Encounter (Signed)
Medication Refill -  Most Recent Primary Care Visit:  Provider: Sherlyn Hay  Department: [facility]  Visit Type: OFFICE VISIT  Date: 09/15/2023    Medication: zolpidem (AMBIEN) 10 MG tablet [11700  need a few until appointment   Has the patient contacted their pharmacy? Yes (Agent: If no, request that the patient contact the pharmacy for the refill. If patient does not wish to contact the pharmacy document the reason why and proceed with request.) (Agent: If yes, when and what did the pharmacy advise?)  Is this the correct pharmacy for this prescription? Yes If no, delete pharmacy and type the correct one.  This is the patient's preferred pharmacy:   [facility] - [place], [place] - [address] [place] [address] Phone: [phone] Fax: [phone]    Has the prescription been filled recently? Yes  Is the patient out of the medication? Yes  Has the patient been seen for an appointment in the last year OR does the patient have an upcoming appointment? Yes  Can we respond through MyChart? No  Agent: Please be advised that Rx refills may take up to 3 business days. We ask that you follow-up with your pharmacy.

## 2023-10-26 ENCOUNTER — Telehealth (INDEPENDENT_AMBULATORY_CARE_PROVIDER_SITE_OTHER): Payer: PPO | Admitting: Family Medicine

## 2023-10-26 DIAGNOSIS — S82832D Other fracture of upper and lower end of left fibula, subsequent encounter for closed fracture with routine healing: Secondary | ICD-10-CM | POA: Diagnosis not present

## 2023-10-26 DIAGNOSIS — G589 Mononeuropathy, unspecified: Secondary | ICD-10-CM | POA: Diagnosis not present

## 2023-10-26 DIAGNOSIS — K7460 Unspecified cirrhosis of liver: Secondary | ICD-10-CM | POA: Diagnosis not present

## 2023-10-26 DIAGNOSIS — E039 Hypothyroidism, unspecified: Secondary | ICD-10-CM | POA: Diagnosis not present

## 2023-10-26 DIAGNOSIS — Z7409 Other reduced mobility: Secondary | ICD-10-CM

## 2023-10-26 DIAGNOSIS — H8110 Benign paroxysmal vertigo, unspecified ear: Secondary | ICD-10-CM | POA: Diagnosis not present

## 2023-10-26 DIAGNOSIS — L03115 Cellulitis of right lower limb: Secondary | ICD-10-CM | POA: Diagnosis not present

## 2023-10-26 DIAGNOSIS — G4700 Insomnia, unspecified: Secondary | ICD-10-CM

## 2023-10-26 DIAGNOSIS — I2510 Atherosclerotic heart disease of native coronary artery without angina pectoris: Secondary | ICD-10-CM | POA: Diagnosis not present

## 2023-10-26 DIAGNOSIS — H9193 Unspecified hearing loss, bilateral: Secondary | ICD-10-CM | POA: Diagnosis not present

## 2023-10-26 DIAGNOSIS — I10 Essential (primary) hypertension: Secondary | ICD-10-CM | POA: Diagnosis not present

## 2023-10-26 DIAGNOSIS — N3281 Overactive bladder: Secondary | ICD-10-CM | POA: Diagnosis not present

## 2023-10-26 DIAGNOSIS — I480 Paroxysmal atrial fibrillation: Secondary | ICD-10-CM | POA: Diagnosis not present

## 2023-10-26 DIAGNOSIS — D696 Thrombocytopenia, unspecified: Secondary | ICD-10-CM | POA: Diagnosis not present

## 2023-10-26 DIAGNOSIS — K219 Gastro-esophageal reflux disease without esophagitis: Secondary | ICD-10-CM | POA: Diagnosis not present

## 2023-10-26 MED ORDER — ZOLPIDEM TARTRATE 10 MG PO TABS: ORAL_TABLET | ORAL | 3 refills | Status: DC

## 2023-10-26 NOTE — Progress Notes (Signed)
Established patient visit   Patient: Ian Sanders   DOB: Jun 03, 1941   82 y.o. Male  MRN: [id number] Visit Date: 10/26/2023  Today's healthcare provider: Mila Merry, MD    Subjective    Discussed the use of AI scribe software for clinical note transcription with the patient, who gave verbal consent to proceed.  History of Present Illness   Mr. Calafiore, a patient with a recent history distal fibular fracture, has been home from rehab for approximately two weeks. The patient reports feeling "pretty good" overall, with some variability in energy levels from day to day. He expresses frustration with the inconvenience of a boot he is currently wearing which is limiting his mobility. he is not able to walk about more than a few feet. He has been using a rented power chair for mobility but plans to return it due to unsatisfactory transfer height. He prefers a manual wheelchair to facilitate transportation to the car and medical appointments.  Regarding medications, the patient reports some initial confusion at the rehabilitation center, including being prescribed cyclobenzaprine, a medication he does not usually take, and inconsistent administration of his incontinence medication. However, he believes his medication regimen is now back on track. The patient anticipates needing a refill of zolpidem, a medication he takes as needed, within the next two weeks.  The patient has a scheduled appointment with a podiatrist next week.       Medications: Outpatient Medications Prior to Visit  Medication Sig   calcium-vitamin D (OSCAL) 250-125 MG-UNIT per tablet Take 1 tablet by mouth 2 (two) times daily.    cyanocobalamin 1000 MCG tablet Take 1 tablet by mouth daily.   cyclobenzaprine (FLEXERIL) 5 MG tablet Take 1 tablet (5 mg total) by mouth 3 (three) times daily as needed for muscle spasms. (Patient not taking: Reported on 10/26/2023)   diltiazem (CARDIZEM CD) 120 MG 24 hr capsule Take 120  mg by mouth daily.   FA-B6-B12-D-Omega 3-Phytoster (ANIMI-3) 1 MG CAPS Take 1 capsule by mouth 1 day or 1 dose.   Ferrous Sulfate 134 MG TABS Take 1 tablet by mouth daily.   flecainide (TAMBOCOR) 50 MG tablet Take 1 tablet (50 mg total) by mouth 2 (two) times daily.   gabapentin (NEURONTIN) 100 MG capsule Take one in the morning, one in the afternoon, and three at night   Leuprolide Acetate, 6 Month, (LUPRON) 45 MG injection Inject 45 mg into the muscle every 6 (six) months.   losartan (COZAAR) 25 MG tablet Take 1 tablet by mouth 2 (two) times daily.   meclizine (ANTIVERT) 25 MG tablet Take 1 tablet by mouth daily.   omeprazole (PRILOSEC) 40 MG capsule Take 40 mg by mouth daily.   polyvinyl alcohol (LIQUIFILM TEARS) 1.4 % ophthalmic solution 1 drop as needed.   propranolol (INDERAL) 10 MG tablet Take 1 tablet by mouth 2 (two) times daily.   trospium (SANCTURA) 20 MG tablet Take 1 tablet (20 mg total) by mouth 2 (two) times daily.   Vibegron (GEMTESA) 75 MG TABS Take 1 tablet (75 mg total) by mouth daily.   [DISCONTINUED] zolpidem (AMBIEN) 10 MG tablet TAKE ONE-HALF TO ONE TABLET AT BEDTIME.   No facility-administered medications prior to visit.   Review of Systems     Objective    There were no vitals taken for this visit.  Physical Exam Awake, alert, oriented x 3. In no apparent distress   Assessment & Plan  Mobility Limitation Difficulty with mobility due to current use of a boot for distal fibula fraction. Patient has rented a power chair but finds it unsuitable. Expressed need for a manual wheelchair for transportation to medical appointments and other outings. -Submit order for a manual wheelchair through [employer].  Medication Management Patient reports some confusion with medication regimen during rehab stay, including unnecessary cyclobenzaprine and irregular incontinence medication. Currently, patient believes medications are back on track. -Continue current  medication regimen. -Refill Zolpidem prescription as needed for sleep, to be sent to [facility].  Follow-up Patient has an upcoming appointment with the podiatrist on 11/03/2023. -Continue current care plan and follow-up after podiatrist appointment.    No follow-ups on file.      Mila Merry, MD  [facility] [phone] (phone) [phone] (fax)  [facility]

## 2023-10-27 DIAGNOSIS — R001 Bradycardia, unspecified: Secondary | ICD-10-CM | POA: Diagnosis not present

## 2023-10-27 DIAGNOSIS — I480 Paroxysmal atrial fibrillation: Secondary | ICD-10-CM | POA: Diagnosis not present

## 2023-10-28 DIAGNOSIS — L03116 Cellulitis of left lower limb: Secondary | ICD-10-CM | POA: Diagnosis not present

## 2023-11-03 ENCOUNTER — Ambulatory Visit: Payer: PPO

## 2023-11-03 ENCOUNTER — Encounter: Payer: Self-pay | Admitting: Podiatry

## 2023-11-03 ENCOUNTER — Ambulatory Visit: Payer: PPO | Admitting: Podiatry

## 2023-11-03 DIAGNOSIS — S8262XA Displaced fracture of lateral malleolus of left fibula, initial encounter for closed fracture: Secondary | ICD-10-CM | POA: Diagnosis not present

## 2023-11-03 DIAGNOSIS — L03116 Cellulitis of left lower limb: Secondary | ICD-10-CM | POA: Diagnosis not present

## 2023-11-03 DIAGNOSIS — S8262XG Displaced fracture of lateral malleolus of left fibula, subsequent encounter for closed fracture with delayed healing: Secondary | ICD-10-CM

## 2023-11-03 NOTE — Progress Notes (Signed)
  Subjective:  Patient ID: Ian Sanders, male    DOB: [birth year],  MRN: [id number]  Chief Complaint  Patient presents with   Fracture    "It's doing good."    History of Present Illness   He returns for follow-up he says it is starting to feel better he has not having any pain he was not able to tolerate using the compression sleeve he is still using the cam boot and walker         Objective:    Physical Exam   EXTREMITIES: No pain unless palpated on the lateral fibula which is mild there is still edema and ecchymosis has improved     LABS Calcium: 9.1 mg/dL 51/88/4166 Vitamin D [id number] is 32 on 10/06/2023  RADIOLOGY Left ankle x-ray: New multiple view films taken today show unchanged alignment, early increased improvement in bridging across fracture site     Assessment:   1. Closed displaced fracture of lateral malleolus of left fibula, initial encounter   2. Vitamin D deficiency      Plan:  Patient was evaluated and treated and all questions answered.  Assessment and Plan    Weber B Fracture of the Lateral Fibula   Improving quite a bit.  His calcium and vitamin D have normalized and he will continue his current over-the-counter vitamin supplementation.  He does have some delayed healing of his fracture but is likely normal for nonoperative treatment at his age.  I recommended continued nonoperative treatment with weightbearing as tolerated in the cam walker boot and he will continue this for the next 3 to 4 weeks I will have new x-rays taken in 4 weeks and we will transition to regular shoe gear and ankle brace at that point     Return in about 4 weeks (around 12/01/2023) for ankle fracture f/u (new xrays).

## 2023-11-05 ENCOUNTER — Ambulatory Visit: Payer: PPO | Admitting: Family Medicine

## 2023-11-12 ENCOUNTER — Telehealth: Payer: Self-pay

## 2023-11-12 NOTE — Telephone Encounter (Signed)
FYI  Copied from CRM [id number]. Topic: General - Other >> Nov 12, 2023  8:45 AM Everette C wrote: Reason for CRM: Judeth Cornfield, OT with [facility] has called to share that the patient's visit scheduled for today 12/*27/24 will be moved to 11/17/23   Please contact further if needed

## 2023-11-25 DIAGNOSIS — H353211 Exudative age-related macular degeneration, right eye, with active choroidal neovascularization: Secondary | ICD-10-CM | POA: Diagnosis not present

## 2023-11-25 DIAGNOSIS — L03116 Cellulitis of left lower limb: Secondary | ICD-10-CM | POA: Diagnosis not present

## 2023-12-01 ENCOUNTER — Ambulatory Visit: Payer: PPO | Admitting: Podiatry

## 2023-12-13 ENCOUNTER — Ambulatory Visit (INDEPENDENT_AMBULATORY_CARE_PROVIDER_SITE_OTHER): Payer: PPO | Admitting: Family Medicine

## 2023-12-13 VITALS — BP 154/64 | HR 56 | Temp 98.1°F | Ht 70.0 in

## 2023-12-13 DIAGNOSIS — K7460 Unspecified cirrhosis of liver: Secondary | ICD-10-CM

## 2023-12-13 DIAGNOSIS — S82832D Other fracture of upper and lower end of left fibula, subsequent encounter for closed fracture with routine healing: Secondary | ICD-10-CM | POA: Diagnosis not present

## 2023-12-13 DIAGNOSIS — K760 Fatty (change of) liver, not elsewhere classified: Secondary | ICD-10-CM | POA: Diagnosis not present

## 2023-12-13 DIAGNOSIS — C61 Malignant neoplasm of prostate: Secondary | ICD-10-CM

## 2023-12-13 DIAGNOSIS — F5101 Primary insomnia: Secondary | ICD-10-CM

## 2023-12-13 DIAGNOSIS — D696 Thrombocytopenia, unspecified: Secondary | ICD-10-CM | POA: Diagnosis not present

## 2023-12-13 DIAGNOSIS — I10 Essential (primary) hypertension: Secondary | ICD-10-CM | POA: Diagnosis not present

## 2023-12-14 MED ORDER — TRAMADOL HCL 50 MG PO TABS: 50.0000 mg | ORAL_TABLET | Freq: Three times a day (TID) | ORAL | 0 refills | Status: AC | PRN

## 2023-12-14 NOTE — Patient Instructions (Signed)
Marland Kitchen  Please review the attached list of medications and notify my office if there are any errors.   . Please bring all of your medications to every appointment so we can make sure that our medication list is the same as yours.

## 2023-12-14 NOTE — Progress Notes (Signed)
Established patient visit   Patient: Ian Sanders   DOB: Mar 05, 1941   83 y.o. 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 He is still wearing boot, but pain is much better. Needs occasional tramadol and prescription needs refills. Had cut back on zolpidem when he was taking pain medications consistently. Remains effective and needs refill. Is due for PSA and urology follow up for prostate cancer. Is also due to check LFTs due to history of cirrhosis. Denies abdominal pain or swelling.   Medications: Outpatient Medications Prior to Visit  Medication Sig   calcium-vitamin D (OSCAL) 250-125 MG-UNIT per tablet Take 1 tablet by mouth 2 (two) times daily.    cyanocobalamin 1000 MCG tablet Take 1 tablet by mouth daily.   cyclobenzaprine (FLEXERIL) 5 MG tablet Take 1 tablet (5 mg total) by mouth 3 (three) times daily as needed for muscle spasms.   diltiazem (CARDIZEM CD) 120 MG 24 hr capsule Take 120 mg by mouth daily.   Ferrous Sulfate 134 MG TABS Take 1 tablet by mouth daily.   flecainide (TAMBOCOR) 50 MG tablet Take 1 tablet (50 mg total) by mouth 2 (two) times daily.   gabapentin (NEURONTIN) 100 MG capsule Take one in the morning, one in the afternoon, and three at night   Leuprolide Acetate, 6 Month, (LUPRON) 45 MG injection Inject 45 mg into the muscle every 6 (six) months.   losartan (COZAAR) 25 MG tablet Take 1 tablet by mouth 2 (two) times daily.   meclizine (ANTIVERT) 25 MG tablet Take 1 tablet by mouth daily.   omeprazole (PRILOSEC) 40 MG capsule Take 40 mg by mouth daily.   polyvinyl alcohol (LIQUIFILM TEARS) 1.4 % ophthalmic solution 1 drop as needed.   propranolol (INDERAL) 10 MG tablet Take 1 tablet by mouth 2 (two) times daily.   trospium (SANCTURA) 20 MG tablet Take 1  tablet (20 mg total) by mouth 2 (two) times daily.   Vibegron (GEMTESA) 75 MG TABS Take 1 tablet (75 mg total) by mouth daily.   zolpidem (AMBIEN) 10 MG tablet TAKE ONE-HALF TO ONE TABLET AT BEDTIME.   FA-B6-B12-D-Omega 3-Phytoster (ANIMI-3) 1 MG CAPS Take 1 capsule by mouth 1 day or 1 dose. (Patient not taking: Reported on 12/13/2023)   No facility-administered medications prior to visit.    Review of Systems     Objective    BP (!) 154/64 (BP Location: Left Arm, Patient Position: Sitting, Cuff Size: Normal)   Pulse (!) 56   Temp 98.1 F (36.7 C) (Oral)   Ht 5\' 10"  (1.778 m)   SpO2 100%   BMI 20.95 kg/m    Physical Exam   General appearance: Well developed, well nourished male, cooperative and in no acute distress Head: Normocephalic, without obvious abnormality, atraumatic Respiratory: Respirations even and unlabored, normal respiratory rate Skin: Skin color, texture, turgor normal. No rashes seen  Psych: Appropriate mood and affect. Neurologic: Mental status: Alert, oriented to person, place, and time, thought content appropriate.   Assessment & Plan     1. Primary insomnia Continue zolpidem which he is back to taking every night. Will change quantity to #30 with next refill.   2. Primary hypertension Well controlled.  Continue current medications.    3. Closed fracture of distal end of left fibula  with routine healing, unspecified fracture morphology, subsequent encounter Healing well, continue orthopedic follow up. Refill tramadol  4. Cirrhosis of liver without ascites, unspecified hepatic cirrhosis type (HCC) (Primary)  - CBC - Comprehensive metabolic panel - TSH  5.  Thrombocytopenia (HCC) Check CBC  6. Prostate cancer (HCC)  - PSA Follow up urology as scheduled.          Mila Merry, MD  [facility] [phone] (phone) [phone] (fax)  [facility]

## 2023-12-20 ENCOUNTER — Encounter: Payer: Self-pay | Admitting: Podiatry

## 2023-12-20 ENCOUNTER — Ambulatory Visit: Payer: HMO | Admitting: Podiatry

## 2023-12-20 ENCOUNTER — Other Ambulatory Visit: Payer: Self-pay | Admitting: Family Medicine

## 2023-12-20 ENCOUNTER — Ambulatory Visit (INDEPENDENT_AMBULATORY_CARE_PROVIDER_SITE_OTHER): Payer: HMO

## 2023-12-20 VITALS — Ht 70.0 in | Wt 146.0 lb

## 2023-12-20 DIAGNOSIS — G4700 Insomnia, unspecified: Secondary | ICD-10-CM

## 2023-12-20 DIAGNOSIS — C61 Malignant neoplasm of prostate: Secondary | ICD-10-CM | POA: Diagnosis not present

## 2023-12-20 DIAGNOSIS — S8262XG Displaced fracture of lateral malleolus of left fibula, subsequent encounter for closed fracture with delayed healing: Secondary | ICD-10-CM

## 2023-12-20 DIAGNOSIS — K760 Fatty (change of) liver, not elsewhere classified: Secondary | ICD-10-CM | POA: Diagnosis not present

## 2023-12-20 DIAGNOSIS — K7460 Unspecified cirrhosis of liver: Secondary | ICD-10-CM | POA: Diagnosis not present

## 2023-12-20 MED ORDER — ZOLPIDEM TARTRATE 10 MG PO TABS: ORAL_TABLET | ORAL | 4 refills | Status: DC

## 2023-12-20 NOTE — Progress Notes (Signed)
  Subjective:  Patient ID: Ian Sanders, male    DOB: [birth year],  MRN: [id number]  Follow-up on left ankle fracture  History of Present Illness   He returns for follow-up not having any pain         Objective:    Physical Exam   EXTREMITIES: Today no pain on the lateral fibula good range of motion of the ankle joint no instability     LABS Calcium: 9.1 mg/dL 82/95/6213 Vitamin D 08-MVHQION is 32 on 10/06/2023  RADIOLOGY Left ankle x-ray: New multiple view films taken today show unchanged alignment, early increased improvement in bridging across fracture site     Assessment:   Encounter Diagnosis  Name Primary?   Closed displaced fracture of lateral malleolus of left fibula with delayed healing, subsequent encounter Yes      Plan:  Patient was evaluated and treated and all questions answered.  Assessment and Plan    Weber B Fracture of the Lateral Fibula   Doing well and is continue to improve.  I recommend that he transition out of the cam walker boot and may return to regular shoe gear and activity with a compression sleeve which I dispensed hopefully should be able to tolerate it better now.  I will see him back in 6 weeks to reevaluate.  Discussed that if he is having any difficulty transitioning or ambulating that physical therapy may be beneficial.  Return in about 6 weeks (around 01/31/2024) for fracture followup (new xrays).

## 2023-12-21 LAB — COMPREHENSIVE METABOLIC PANEL
ALT: 11 [IU]/L (ref 0–44)
AST: 29 [IU]/L (ref 0–40)
Albumin: 4.1 g/dL (ref 3.7–4.7)
Alkaline Phosphatase: 106 [IU]/L (ref 44–121)
BUN/Creatinine Ratio: 16 (ref 10–24)
BUN: 11 mg/dL (ref 8–27)
Bilirubin Total: 1 mg/dL (ref 0.0–1.2)
CO2: 27 mmol/L (ref 20–29)
Calcium: 9.1 mg/dL (ref 8.6–10.2)
Chloride: 100 mmol/L (ref 96–106)
Creatinine, Ser: 0.7 mg/dL — ABNORMAL LOW (ref 0.76–1.27)
Globulin, Total: 2.6 g/dL (ref 1.5–4.5)
Glucose: 115 mg/dL — ABNORMAL HIGH (ref 70–99)
Potassium: 4.3 mmol/L (ref 3.5–5.2)
Sodium: 138 mmol/L (ref 134–144)
Total Protein: 6.7 g/dL (ref 6.0–8.5)
eGFR: 91 mL/min/{1.73_m2} (ref >59)

## 2023-12-21 LAB — CBC
Hematocrit: 35.6 % — ABNORMAL LOW (ref 37.5–51.0)
Hemoglobin: 11.3 g/dL — ABNORMAL LOW (ref 13.0–17.7)
MCH: 25.8 pg — ABNORMAL LOW (ref 26.6–33.0)
MCHC: 31.7 g/dL (ref 31.5–35.7)
MCV: 81 fL (ref 79–97)
Platelets: 57 10*3/uL — CL (ref 150–450)
RBC: 4.38 x10E6/uL (ref 4.14–5.80)
RDW: 14.1 % (ref 11.6–15.4)
WBC: 2.7 10*3/uL — ABNORMAL LOW (ref 3.4–10.8)

## 2023-12-21 LAB — PSA: Prostate Specific Ag, Serum: <0.1 ng/mL (ref 0.0–4.0)

## 2023-12-21 LAB — TSH: TSH: 1.76 u[IU]/mL (ref 0.450–4.500)

## 2023-12-23 ENCOUNTER — Telehealth: Payer: Self-pay | Admitting: Family Medicine

## 2023-12-23 NOTE — Telephone Encounter (Signed)
 Pt. Given lab results and instructions. Asking for a copy of lab work - will pick it up at front desk. Please advise pt.

## 2023-12-23 NOTE — Telephone Encounter (Signed)
 Done

## 2024-01-13 ENCOUNTER — Ambulatory Visit: Payer: HMO | Admitting: Physician Assistant

## 2024-01-13 DIAGNOSIS — C61 Malignant neoplasm of prostate: Secondary | ICD-10-CM

## 2024-01-13 MED ORDER — LEUPROLIDE ACETATE (6 MONTH) 45 MG ~~LOC~~ KIT
45.0000 mg | PACK | Freq: Once | SUBCUTANEOUS | Status: AC
  Administered 2024-01-13: 45 mg via SUBCUTANEOUS

## 2024-01-13 NOTE — Progress Notes (Addendum)
 Eligard SubQ Injection   Due to Prostate Cancer patient is present today for a Eligard Injection.  Medication: Eligard 6 month Dose: 45 mg  Location: left UOQ Lot: [id number] Exp: 01/14/2025  Patient tolerated well, no complications were noted  Performed by: Vela Prose  Per Dr. Lonna Cobb patient is to continue therapy indefinitely. Patient's next follow up was scheduled for 06/17/2024. This appointment was scheduled using wheel and given to patient today along with reminder continue on Vitamin D 800-1000iu and Calcium 1000-1200mg  daily while on Androgen Deprivation Therapy.  PA approval dates:

## 2024-01-15 ENCOUNTER — Other Ambulatory Visit: Payer: Self-pay | Admitting: Family Medicine

## 2024-01-18 ENCOUNTER — Ambulatory Visit: Payer: PPO

## 2024-01-18 DIAGNOSIS — Z0000 Encounter for general adult medical examination without abnormal findings: Secondary | ICD-10-CM | POA: Diagnosis not present

## 2024-01-18 DIAGNOSIS — Z1211 Encounter for screening for malignant neoplasm of colon: Secondary | ICD-10-CM

## 2024-01-18 NOTE — Progress Notes (Signed)
 Ian Sanders:   Ian Sanders is a 83 y.o. who presents for a Medicare Wellness preventive visit.  Visit Complete: Virtual I connected with  [name] on 01/18/24 by a audio enabled telemedicine application and verified that I am speaking with the correct person using two identifiers.  Patient Location: Home  Provider Location: Office/Clinic  I discussed the limitations of evaluation and management by telemedicine. The patient expressed understanding and agreed to proceed.  Vital Signs: Because this visit was a virtual/telehealth visit, some criteria may be missing or patient reported. Any vitals not documented were not able to be obtained and vitals that have been documented are patient reported.  VideoDeclined- This patient declined Librarian, academic. Therefore the visit was completed with audio only.  AWV Questionnaire: No: Patient Medicare AWV questionnaire was not completed prior to this visit.  Cardiac Risk Factors include: advanced age (>11men, >45 women);dyslipidemia;hypertension;male gender     Objective:    There were no vitals filed for this visit. There is no height or weight on file to calculate BMI.     01/18/2024    1:52 PM 09/18/2023    6:57 PM 09/18/2023   12:11 AM 09/11/2023   10:47 AM 01/13/2023    1:20 PM 03/30/2022    2:11 PM 01/12/2022    1:51 PM  Advanced Directives  Does Patient Have a Medical Advance Directive? No No No Yes Yes No No  Type of Aeronautical engineer of [place];Living will  Healthcare Power of [place];Living will   Would patient like information on creating a medical advance directive? No - Patient declined No - Patient declined     No - Patient declined    Current Medications (verified) Outpatient Encounter Medications as of 01/18/2024  Medication Sig   calcium-vitamin D (OSCAL) 250-125 MG-UNIT per tablet Take 1 tablet by mouth 2 (two) times daily.    cyanocobalamin 1000 MCG tablet Take  1 tablet by mouth daily.   cyclobenzaprine (FLEXERIL) 5 MG tablet Take 1 tablet (5 mg total) by mouth 3 (three) times daily as needed for muscle spasms.   diltiazem (CARDIZEM CD) 120 MG 24 hr capsule Take 120 mg by mouth daily.   Ferrous Sulfate 134 MG TABS Take 1 tablet by mouth daily.   flecainide (TAMBOCOR) 50 MG tablet Take 1 tablet (50 mg total) by mouth 2 (two) times daily.   gabapentin (NEURONTIN) 100 MG capsule Take one in the morning, one in the afternoon, and three at night   Leuprolide Acetate, 6 Month, (LUPRON) 45 MG injection Inject 45 mg into the muscle every 6 (six) months.   losartan (COZAAR) 25 MG tablet Take 1 tablet by mouth 2 (two) times daily.   meclizine (ANTIVERT) 25 MG tablet Take 1 tablet by mouth daily.   omeprazole (PRILOSEC) 40 MG capsule Take 40 mg by mouth daily.   polyvinyl alcohol (LIQUIFILM TEARS) 1.4 % ophthalmic solution 1 drop as needed.   propranolol (INDERAL) 10 MG tablet Take 1 tablet by mouth 2 (two) times daily.   trospium (SANCTURA) 20 MG tablet Take 1 tablet (20 mg total) by mouth 2 (two) times daily.   Vibegron (GEMTESA) 75 MG TABS Take 1 tablet (75 mg total) by mouth daily.   zolpidem (AMBIEN) 10 MG tablet TAKE ONE-HALF TO ONE TABLET AT BEDTIME.   No facility-administered encounter medications on file as of 01/18/2024.    Allergies (verified) Lisinopril, Myrbetriq [mirabegron], Beta adrenergic blockers, Other, Triamterene, Amlodipine,  Atorvastatin, Dabigatran etexilate mesylate, Dabigatran etexilate mesylate, and Latex   History: Past Medical History:  Diagnosis Date   Atrial fibrillation (HCC)    Basal cell carcinoma    Benign paroxysmal positional vertigo    Cancer (HCC)    History of chicken pox    History of measles    History of mumps    Hypertension    Mononeuritis of unspecified site    Other seborrheic keratosis    Stroke (HCC) 06/2020   Transient ischemic attack (TIA), and cerebral infarction without residual deficits(V12.54)     Traumatic subdural hematoma without loss of consciousness (HCC) 06/25/2020   Unspecified hypothyroidism    Past Surgical History:  Procedure Laterality Date   Carotid Doppler Ultrasound  04/02/2010   Small amount Calcified plaque bilaterally, no significant stenosis.   CATARACT EXTRACTION W/PHACO Right 04/15/2021   Procedure: CATARACT EXTRACTION PHACO AND INTRAOCULAR LENS PLACEMENT (IOC) RIGHT;  Surgeon: Ian Manila, MD;  Location: [facility];  Service: Ophthalmology;  Laterality: Right;  7.98 0:47.2   CATARACT EXTRACTION W/PHACO Left 05/20/2021   Procedure: CATARACT EXTRACTION PHACO AND INTRAOCULAR LENS PLACEMENT (IOC) LEFT 3.37 00:32.0;  Surgeon: Ian Manila, MD;  Location: [facility];  Service: Ophthalmology;  Laterality: Left;  Requests arrival 10A or after   COLONOSCOPY  05/11/2014   Tubular Adenoma. Dr. Servando Snare   COLONOSCOPY WITH PROPOFOL N/A 05/23/2018   Procedure: COLONOSCOPY WITH PROPOFOL;  Surgeon: Ian Mood, MD;  Location: [facility] END[facility]COPY;  Service: Gastroenterology;  Laterality: N/A;   DOPPLER ECHOCARDIOGRAPHY  04/02/2010   Mild left atrial dilation. Normal right atrium. No valvular disease. No thrombus. Normal LV function. EF>55%   ESOPHAGOGASTRODUODENOSCOPY (EGD) WITH PROPOFOL N/A 08/16/2019   Procedure: ESOPHAGOGASTRODUODENOSCOPY (EGD) WITH PROPOFOL;  Surgeon: Ian Mood, MD;  Location: [facility];  Service: Gastroenterology;  Laterality: N/A;   FINGER AMPUTATION     HERNIA REPAIR Right    MRI Brain with and without contrast  03/20/2010   Suggestive of basal ganglia lacunar infarct   PROSTATECTOMY  11/16/1993   Abdominal   SKIN SURGERY     multiple   Thumb surgery     Family History  Problem Relation Age of Onset   Heart disease Mother 59       CABG   Hypertension Mother    Heart attack Mother    Hypertension Brother    Heart disease Sister        stents   Prostate cancer Brother    Kidney cancer Brother    Skin cancer Brother     Hypertension Brother    Bladder Cancer Sister    Leukemia Father    Stroke Father        multiple   Cerebral aneurysm Sister    Kidney cancer Brother    Social History   Socioeconomic History   Marital status: Married    Spouse name: Not on file   Number of children: 2   Years of education: Not on file   Highest education level: Associate degree: occupational, Scientist, product/process development, or vocational program  Occupational History   Occupation: Retired  Tobacco Use   Smoking status: Former    Current packs/day: 0.00    Types: Cigarettes   Smokeless tobacco: Current    Types: Chew   Tobacco comments:    quit over 45 years ago  Vaping Use   Vaping status: Never Used  Substance and Sexual Activity   Alcohol use: Not Currently    Alcohol/week: 5.0 standard drinks of  alcohol    Types: 5 Cans of beer per week    Comment: quit drinking summer 2021   Drug use: No   Sexual activity: Not on file  Other Topics Concern   Not on file  Social History Narrative   Lives with wife at home ; alcohol abuse; quit smoking 2016; 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   Social Drivers of Corporate investment banker Strain: Low Risk  (01/18/2024)   Overall Financial Resource Strain (CARDIA)    Difficulty of Paying Living Expenses: Not hard at all  Food Insecurity: No Food Insecurity (01/18/2024)   Hunger Vital Sign    Worried About Running Out of Food in the Last Year: Never true    Ran Out of Food in the Last Year: Never true  Transportation Needs: No Transportation Needs (01/18/2024)   PRAPARE - Administrator, Civil Service (Medical): No    Lack of Transportation (Non-Medical): No  Physical Activity: Insufficiently Active (01/18/2024)   Exercise Vital Sign    Days of Exercise per Week: 7 days    Minutes of Exercise per Session: 20 min  Stress: No Stress Concern Present (01/18/2024)   [employer] of Occupational Health - Occupational Stress Questionnaire    Feeling of Stress  : Not at all  Social Connections: Moderately Isolated (01/18/2024)   Social Connection and Isolation Panel [NHANES]    Frequency of Communication with Friends and Family: More than three times a week    Frequency of Social Gatherings with Friends and Family: Never    Attends Religious Services: Never    Database administrator or Organizations: No    Attends Engineer, structural: Never    Marital Status: Married    Tobacco Counseling Ready to quit: Not Answered Counseling given: Not Answered Tobacco comments: quit over 45 years ago    Clinical Intake:  Pre-visit preparation completed: Yes  Pain : No/denies pain     BMI - recorded: 20.9 Nutritional Status: BMI of 19-24  Normal Nutritional Risks: None Diabetes: No  How often do you need to have someone help you when you read instructions, pamphlets, or other written materials from your doctor or pharmacy?: 1 - Never  Interpreter Needed?: No  Information entered by :: Kennedy Bucker, LPN   Activities of Daily Living     01/18/2024    1:55 PM 09/18/2023    6:57 PM  In your present state of health, do you have any difficulty performing the following activities:  Hearing? 1 0  Vision? 0 0  Difficulty concentrating or making decisions? 0 0  Walking or climbing stairs? 1   Dressing or bathing? 0   Doing errands, shopping? 0   Preparing Food and eating ? N   Using the Toilet? N   In the past six months, have you accidently leaked urine? N   Do you have problems with loss of bowel control? N   Managing your Medications? N   Managing your Finances? N   Housekeeping or managing your Housekeeping? Y     Patient Care Team: Malva Limes, MD as PCP - General (Family Medicine) Hulan Fray as Physician Assistant (Urology) Marcina Millard, MD as Consulting Physician (Cardiology) Earna Coder, MD as Consulting Physician (Hematology and Oncology) Carman Ching, PA-C as Physician  Assistant (Urology) Ian Manila, MD as Referring Physician (Ophthalmology) Dermatology, [place]  Indicate any recent Medical Services you may have received from other than [facility] providers  in the past year (date may be approximate).     Assessment:   This is a routine wellness examination for [place].  Hearing/Vision screen Hearing Screening - Comments:: WEARS AIDS, BOTH EARS  Vision Screening - Comments:: WEARS GLASSES ALL THE TIME- [facility]   Goals Addressed             This Visit's Progress    Cut out extra servings         Depression Screen     01/18/2024    1:51 PM 01/13/2023    1:18 PM 01/12/2022    1:49 PM 11/21/2021   11:01 AM 03/17/2021    9:44 AM 05/14/2020    2:43 PM 05/09/2019    1:46 PM  PHQ 2/9 Scores  PHQ - 2 Score 0 0 0 0 0 0 0  PHQ- 9 Score 0  4 5 1  5     Fall Risk     01/18/2024    1:55 PM 01/13/2023    1:13 PM 01/12/2022    1:52 PM 11/21/2021   11:00 AM 03/17/2021    9:44 AM  Fall Risk   Falls in the past year? 0 1 1 1  0  Number falls in past yr: 1 1 1  0 0  Injury with Fall? 1 0 0 0 0  Risk for fall due to : Impaired balance/gait;History of fall(s) No Fall Risks History of fall(s) No Fall Risks No Fall Risks  Follow up Falls prevention discussed;Falls evaluation completed Falls prevention discussed;Education provided Falls prevention discussed Falls evaluation completed Falls evaluation completed    MEDICARE RISK AT HOME:  Medicare Risk at Home Any stairs in or around the home?: Yes If so, are there any without handrails?: Yes Home free of loose throw rugs in walkways, pet beds, electrical cords, etc?: Yes Adequate lighting in your home to reduce risk of falls?: Yes Life alert?: No Use of a cane, walker or w/c?: Yes (USES WALKER ALL THE TIME) Grab bars in the bathroom?: Yes Shower chair or bench in shower?: Yes Elevated toilet seat or a handicapped toilet?: Yes  TIMED UP AND GO:  Was the test performed?  No  Cognitive Function: 6CIT  completed        01/18/2024    1:57 PM 01/13/2023    1:23 PM 05/02/2018    1:35 PM 04/20/2017   10:44 AM  6CIT Screen  What Year? 0 points 0 points 0 points 0 points  What month? 0 points 0 points 0 points 0 points  What time? 0 points 0 points 0 points 0 points  Count back from 20 0 points 0 points 0 points 0 points  Months in reverse 0 points 0 points 2 points 2 points  Repeat phrase 0 points 0 points 4 points 0 points  Total Score 0 points 0 points 6 points 2 points    Immunizations Immunization History  Administered Date(s) Administered   Fluad Quad(high Dose 65+) 08/19/2020, 08/29/2021   Fluad Trivalent(High Dose 65+) 08/25/2017, 09/15/2023   Hep A / Hep B 08/24/2019   Hep A, Unspecified 08/24/2019   Hep B, Unspecified 08/24/2019   Hepatitis A, Adult 08/19/2020   Hepb-cpg 08/19/2020   Influenza, High Dose Seasonal PF 08/09/2015, 07/23/2016, 08/26/2018, 10/02/2019   Influenza, Seasonal, Injecte, Preservative Fre 07/31/2010   Influenza-Unspecified 10/29/2004, 08/25/2006, 10/19/2007, 07/31/2010, 08/16/2014, 08/17/2015, 07/17/2016, 08/25/2017, 10/02/2019, 08/25/2021   PFIZER(Purple Top)SARS-COV-2 Vaccination 11/17/2019, 12/01/2019, 12/22/2019, 09/03/2020   Pneumococcal Conjugate-13 12/15/2013, 08/17/2015   Pneumococcal Polysaccharide-23 11/16/2006,  10/19/2007   Td 05/29/2016   Tdap 11/17/2011    Screening Tests Health Maintenance  Topic Date Due   Zoster Vaccines- Shingrix (1 of 2) Never done   Diabetic kidney evaluation - Urine ACR  04/27/2018   OPHTHALMOLOGY EXAM  06/25/2020   HEMOGLOBIN A1C  01/13/2022   Colonoscopy  05/24/2023   COVID-19 Vaccine (5 - 2024-25 season) 07/18/2023   Diabetic kidney evaluation - eGFR measurement  12/19/2024   Medicare Annual Wellness (AWV)  01/17/2025   DTaP/Tdap/Td (3 - Td or Tdap) 05/29/2026   Pneumonia Vaccine 40+ Years old  Completed   INFLUENZA VACCINE  Completed   HPV VACCINES  Aged Out   Hepatitis C Screening  Discontinued     Health Maintenance  Health Maintenance Due  Topic Date Due   Zoster Vaccines- Shingrix (1 of 2) Never done   Diabetic kidney evaluation - Urine ACR  04/27/2018   OPHTHALMOLOGY EXAM  06/25/2020   HEMOGLOBIN A1C  01/13/2022   Colonoscopy  05/24/2023   COVID-19 Vaccine (5 - 2024-25 season) 07/18/2023   Health Maintenance Items Addressed: Referral sent to GI for colonoscopy  Additional Screening:  Vision Screening: Recommended annual ophthalmology exams for early detection of glaucoma and other disorders of the eye.  Dental Screening: Recommended annual dental exams for proper oral hygiene  Community Resource Referral / Chronic Care Management: CRR required this visit?  No   CCM required this visit?  No     Plan:     I have personally reviewed and noted the following in the patient's chart:   Medical and social history Use of alcohol, tobacco or illicit drugs  Current medications and supplements including opioid prescriptions. Patient is not currently taking opioid prescriptions. Functional ability and status Nutritional status Physical activity Advanced directives List of other physicians Hospitalizations, surgeries, and ER visits in previous 12 months Vitals Screenings to include cognitive, depression, and falls Referrals and appointments  In addition, I have reviewed and discussed with patient certain preventive protocols, quality metrics, and best practice recommendations. A written personalized care plan for preventive services as well as general preventive health recommendations were provided to patient.     Hal Hope, LPN   11/21/1094   After Visit Summary: (MyChart) Due to this being a telephonic visit, the after visit summary with patients personalized plan was offered to patient via MyChart   Notes:  REFERRAL FOR COLONOSCOPY SENT

## 2024-01-18 NOTE — Patient Instructions (Addendum)
 Ian Sanders , Thank you for taking time to come for your Medicare Wellness Visit. I appreciate your ongoing commitment to your health goals. Please review the following plan we discussed and let me know if I can assist you in the future.   Referrals/Orders/Follow-Ups/Clinician Recommendations: REFERRAL SENT FOR COLONOSCOPY  This is a list of the screening recommended for you and due dates:  Health Maintenance  Topic Date Due   Zoster (Shingles) Vaccine (1 of 2) Never done   Yearly kidney health urinalysis for diabetes  04/27/2018   Eye exam for diabetics  06/25/2020   Hemoglobin A1C  01/13/2022   Colon Cancer Screening  05/24/2023   COVID-19 Vaccine (5 - 2024-25 season) 07/18/2023   Yearly kidney function blood test for diabetes  12/19/2024   Medicare Annual Wellness Visit  01/17/2025   DTaP/Tdap/Td vaccine (3 - Td or Tdap) 05/29/2026   Pneumonia Vaccine  Completed   Flu Shot  Completed   HPV Vaccine  Aged Out   Hepatitis C Screening  Discontinued    Advanced directives: (ACP Link)Information on Advanced Care Planning can be found at [facility] of [place] Advance Health Care Directives Advance Health Care Directives ([email or web address]   Next Medicare Annual Wellness Visit scheduled for next year: Yes   01/23/25 @ 3:10 PM BY PHONE

## 2024-01-20 DIAGNOSIS — H353211 Exudative age-related macular degeneration, right eye, with active choroidal neovascularization: Secondary | ICD-10-CM | POA: Diagnosis not present

## 2024-01-31 ENCOUNTER — Ambulatory Visit: Payer: HMO | Admitting: Podiatry

## 2024-01-31 ENCOUNTER — Encounter: Payer: Self-pay | Admitting: Podiatry

## 2024-01-31 ENCOUNTER — Ambulatory Visit (INDEPENDENT_AMBULATORY_CARE_PROVIDER_SITE_OTHER)

## 2024-01-31 DIAGNOSIS — S8262XG Displaced fracture of lateral malleolus of left fibula, subsequent encounter for closed fracture with delayed healing: Secondary | ICD-10-CM

## 2024-02-01 NOTE — Progress Notes (Signed)
  Subjective:  Patient ID: Ian Sanders, male    DOB: [birth year],  MRN: [id number]  Follow-up on left ankle fracture  History of Present Illness   He returns for follow-up not having any pain does note some swelling         Objective:    Physical Exam   EXTREMITIES: Today Little to no pain on the lateral fibula good range of motion ankle joint no instability     LABS Calcium: 9.1 mg/dL 40/98/1191 Vitamin D 47-WGNFAOZ is 32 on 10/06/2023  RADIOLOGY Left ankle x-ray: New multiple view films taken today show unchanged alignment, near complete nation across fracture site     Assessment:   Encounter Diagnosis  Name Primary?   Closed displaced fracture of lateral malleolus of left fibula with delayed healing, subsequent encounter Yes      Plan:  Patient was evaluated and treated and all questions answered.  Assessment and Plan    Weber B Fracture of the Lateral Fibula   Overall doing well.  May continue activity and shoe gear as tolerated.  He is ambulating at his baseline comfortably in a walker.  Discussed if he has any increasing pain and swelling or functional limitation to notify me to reevaluate.  Follow-up with me as needed.  Return if symptoms worsen or fail to improve.

## 2024-03-16 DIAGNOSIS — H353211 Exudative age-related macular degeneration, right eye, with active choroidal neovascularization: Secondary | ICD-10-CM | POA: Diagnosis not present

## 2024-03-23 DIAGNOSIS — H353221 Exudative age-related macular degeneration, left eye, with active choroidal neovascularization: Secondary | ICD-10-CM | POA: Diagnosis not present

## 2024-04-20 DIAGNOSIS — H353221 Exudative age-related macular degeneration, left eye, with active choroidal neovascularization: Secondary | ICD-10-CM | POA: Diagnosis not present

## 2024-04-30 ENCOUNTER — Emergency Department

## 2024-04-30 ENCOUNTER — Emergency Department
Admission: EM | Admit: 2024-04-30 | Discharge: 2024-04-30 | Disposition: A | Discharge status: Home / Self Care | Attending: Emergency Medicine | Admitting: Emergency Medicine

## 2024-04-30 ENCOUNTER — Other Ambulatory Visit: Payer: Self-pay

## 2024-04-30 DIAGNOSIS — K5900 Constipation, unspecified: Secondary | ICD-10-CM | POA: Insufficient documentation

## 2024-04-30 DIAGNOSIS — Z8673 Personal history of transient ischemic attack (TIA), and cerebral infarction without residual deficits: Secondary | ICD-10-CM | POA: Insufficient documentation

## 2024-04-30 DIAGNOSIS — R1012 Left upper quadrant pain: Secondary | ICD-10-CM | POA: Diagnosis not present

## 2024-04-30 DIAGNOSIS — I10 Essential (primary) hypertension: Secondary | ICD-10-CM | POA: Diagnosis not present

## 2024-04-30 DIAGNOSIS — R1084 Generalized abdominal pain: Secondary | ICD-10-CM | POA: Diagnosis not present

## 2024-04-30 LAB — CBC WITH DIFFERENTIAL/PLATELET
Abs Immature Granulocytes: 0.02 10*3/uL (ref 0.00–0.07)
Basophils Absolute: 0 10*3/uL (ref 0.0–0.1)
Basophils Relative: 0 %
Eosinophils Absolute: 0.1 10*3/uL (ref 0.0–0.5)
Eosinophils Relative: 3 %
HCT: 34.8 % — ABNORMAL LOW (ref 39.0–52.0)
Hemoglobin: 11.2 g/dL — ABNORMAL LOW (ref 13.0–17.0)
Immature Granulocytes: 1 %
Lymphocytes Relative: 14 %
Lymphs Abs: 0.4 10*3/uL — ABNORMAL LOW (ref 0.7–4.0)
MCH: 26.1 pg (ref 26.0–34.0)
MCHC: 32.2 g/dL (ref 30.0–36.0)
MCV: 81.1 fL (ref 80.0–100.0)
Monocytes Absolute: 0.1 10*3/uL (ref 0.1–1.0)
Monocytes Relative: 5 %
Neutro Abs: 1.9 10*3/uL (ref 1.7–7.7)
Neutrophils Relative %: 77 %
Platelets: 45 10*3/uL — ABNORMAL LOW (ref 150–400)
RBC: 4.29 MIL/uL (ref 4.22–5.81)
RDW: 14.1 % (ref 11.5–15.5)
WBC: 2.4 10*3/uL — ABNORMAL LOW (ref 4.0–10.5)
nRBC: 0 % (ref 0.0–0.2)

## 2024-04-30 LAB — LIPASE, BLOOD: Lipase: 42 U/L (ref 11–51)

## 2024-04-30 LAB — COMPREHENSIVE METABOLIC PANEL WITH GFR
ALT: 14 U/L (ref 0–44)
AST: 27 U/L (ref 15–41)
Albumin: 3.9 g/dL (ref 3.5–5.0)
Alkaline Phosphatase: 79 U/L (ref 38–126)
Anion gap: 8 (ref 5–15)
BUN: 19 mg/dL (ref 8–23)
CO2: 27 mmol/L (ref 22–32)
Calcium: 9.2 mg/dL (ref 8.9–10.3)
Chloride: 102 mmol/L (ref 98–111)
Creatinine, Ser: 0.72 mg/dL (ref 0.61–1.24)
GFR, Estimated: >60 mL/min (ref >60)
Glucose, Bld: 123 mg/dL — ABNORMAL HIGH (ref 70–99)
Potassium: 4 mmol/L (ref 3.5–5.1)
Sodium: 137 mmol/L (ref 135–145)
Total Bilirubin: 1.1 mg/dL (ref 0.0–1.2)
Total Protein: 7.2 g/dL (ref 6.5–8.1)

## 2024-04-30 MED ORDER — KETOROLAC TROMETHAMINE 30 MG/ML IJ SOLN: 15.0000 mg | Freq: Once | INTRAMUSCULAR | Status: DC

## 2024-04-30 NOTE — ED Notes (Addendum)
 Requested urine sample from pt. Pt states that he can not urinate at this time. EDP Bradler notified of delay.

## 2024-04-30 NOTE — ED Triage Notes (Signed)
 Pt arrives from home via [facility].  C/O Upper left quadrant abdominal pain of 6 that began 9-10pm last night. Denies N/V/D. LBM last night assisted by suppository.  Also states tingling in left hand.  Denies taking blood thinners. 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

## 2024-04-30 NOTE — ED Provider Notes (Signed)
 [facility] Provider Note   Event Date/Time   First MD Initiated Contact with Patient 04/30/24 0300     (approximate) History  Abdominal Pain  HPI Ian Sanders is a 83 y.o. male with a past medical history of TIA, hypertension, atrial fibrillation, and hyperlipidemia who presents complaining of left upper quadrant abdominal pain that began approximately 8 hours prior to arrival and has been slightly worsening since onset.  Patient currently describes the pain as a 7/10 with no radiation.  Patient states that he has had issues with constipation over the last few days and needed a suppository to defecate today ROS: Patient currently denies any vision changes, tinnitus, difficulty speaking, facial droop, sore throat, chest pain, shortness of breath, nausea/vomiting/diarrhea, dysuria, or weakness/numbness/paresthesias in any extremity   Physical Exam  Triage Vital Signs: ED Triage Vitals  Encounter Vitals Group     BP      Girls Systolic BP Percentile      Girls Diastolic BP Percentile      Boys Systolic BP Percentile      Boys Diastolic BP Percentile      Pulse      Resp      Temp      Temp src      SpO2      Weight      Height      Head Circumference      Peak Flow      Pain Score      Pain Loc      Pain Education      Exclude from Growth Chart    Most recent vital signs: Vitals:   04/30/24 0308  BP: (!) 157/63  Pulse: (!) 59  Resp: 20  Temp: (!) 97.5 F (36.4 C)  SpO2: 98%   General: Awake, oriented x4. CV:  Good peripheral perfusion. Resp:  Normal effort. Abd:  No distention. Other:  Elderly well-developed, well-nourished Caucasian male resting comfortably in no acute distress ED Results / Procedures / Treatments  Labs (all labs ordered are listed, but only abnormal results are displayed) Labs Reviewed  COMPREHENSIVE METABOLIC PANEL WITH GFR - Abnormal; Notable for the following components:      Result Value   Glucose, Bld 123 (*)     All other components within normal limits  CBC WITH DIFFERENTIAL/PLATELET - Abnormal; Notable for the following components:   WBC 2.4 (*)    Hemoglobin 11.2 (*)    HCT 34.8 (*)    Platelets 45 (*)    Lymphs Abs 0.4 (*)    All other components within normal limits  LIPASE, BLOOD  URINALYSIS, ROUTINE W REFLEX MICROSCOPIC   RADIOLOGY ED MD interpretation: Single view portable x-ray of the abdomen shows diffuse gaseous distention of the small bowel with gas and stool seen in the right and transverse colon.  Bowel gas is not overtly obstructed at this time - All radiology independently interpreted and agree with radiology assessment Official radiology report(s): DG Abdomen 1 View Result Date: 04/30/2024 CLINICAL DATA:  Constipation. EXAM: ABDOMEN - 1 VIEW COMPARISON:  None Available. FINDINGS: Diffuse gaseous distension of small bowel evident with gas and stool seen in the right and transverse colon. Surgical clips are seen along the pelvic sidewall bilaterally. Possible left renal stone. Bones are diffusely demineralized. IMPRESSION: Diffuse gaseous distension of small bowel with gas and stool seen in the right and transverse colon. Bowel gas pattern is not overtly obstructive at this time although  ileus or evolving obstruction could have this appearance. Electronically Signed   By: Donnal Fusi M.D.   On: 04/30/2024 05:17   PROCEDURES: Critical Care performed: No Procedures MEDICATIONS ORDERED IN ED: Medications  ketorolac (TORADOL) 30 MG/ML injection 15 mg (0 mg Intravenous Hold 04/30/24 0315)   IMPRESSION / MDM / ASSESSMENT AND PLAN / ED COURSE  I reviewed the triage vital signs and the nursing notes.                             The patient is on the cardiac monitor to evaluate for evidence of arrhythmia and/or significant heart rate changes. Patient's presentation is most consistent with acute presentation with potential threat to life or bodily function. Patient's history and exam  most consistent with constipation as an etiology for their pain.  Patient's symptoms not typical for other emergent causes of abdominal pain such as, but not limited to, appendicitis, abdominal aortic aneurysm, pancreatitis, SBO, mesenteric ischemia, serious intra-abdominal bacterial illness.  Patient without red flags concerning for cancer as a constipation etiology. Single view abdominal x-ray shows diffuse gaseous distention of the small bowel concerning for evolving obstruction or ileus.  Of note, patient has had no nausea or vomiting with this pain and had a bowel movement earlier today.  At this time I have low suspicion for ileus or small bowel obstruction.  Patient given strict return precautions Rx: Miralax   Disposition:  Patient will be discharged with strict return precautions and follow up with primary MD within 24-48 hours for further evaluation. Patient understands that this still may have an early presentation of an emergent medical condition such as appendicitis that will require a recheck.   FINAL CLINICAL IMPRESSION(S) / ED DIAGNOSES   Final diagnoses:  Left upper quadrant abdominal pain  Constipation, unspecified constipation type   Rx / DC Orders   ED Discharge Orders     None      Note:  This document was prepared using Dragon voice recognition software and may include unintentional dictation errors.   Allee Busk K, MD 04/30/24 [phone]

## 2024-04-30 NOTE — ED Notes (Signed)
 Per lab,machine malfunction holding up CBC results.  EDP Bradler notified of delay.

## 2024-04-30 NOTE — Discharge Instructions (Signed)
 Please use MiraLAX one half capful every hour until your first bowel movement.  Please do not take any MiraLAX after this for at least 24 hours.  You may use one half capful twice a day of MiraLAX in order to have 1 solid well-formed bowel movement per day.  You may increase or decrease this dosage as needed to obtain this 1 well-formed bowel movement.  Please make sure that you are drinking at least 8 ounces of water every hour during this initial bowel regimen. ?

## 2024-05-17 DIAGNOSIS — H353211 Exudative age-related macular degeneration, right eye, with active choroidal neovascularization: Secondary | ICD-10-CM | POA: Diagnosis not present

## 2024-05-25 DIAGNOSIS — H353221 Exudative age-related macular degeneration, left eye, with active choroidal neovascularization: Secondary | ICD-10-CM | POA: Diagnosis not present

## 2024-06-08 ENCOUNTER — Other Ambulatory Visit: Payer: Self-pay | Admitting: Family Medicine

## 2024-06-08 DIAGNOSIS — G4700 Insomnia, unspecified: Secondary | ICD-10-CM

## 2024-06-23 ENCOUNTER — Ambulatory Visit (INDEPENDENT_AMBULATORY_CARE_PROVIDER_SITE_OTHER): Payer: PPO | Admitting: Family Medicine

## 2024-06-23 ENCOUNTER — Encounter: Payer: Self-pay | Admitting: Family Medicine

## 2024-06-23 VITALS — BP 132/65 | HR 61 | Temp 97.3°F | Ht 70.0 in | Wt 143.5 lb

## 2024-06-23 DIAGNOSIS — I700 Atherosclerosis of aorta: Secondary | ICD-10-CM

## 2024-06-23 DIAGNOSIS — K7460 Unspecified cirrhosis of liver: Secondary | ICD-10-CM | POA: Diagnosis not present

## 2024-06-23 DIAGNOSIS — I10 Essential (primary) hypertension: Secondary | ICD-10-CM | POA: Diagnosis not present

## 2024-06-23 NOTE — Patient Instructions (Signed)
 Marland Kitchen  Please review the attached list of medications and notify my office if there are any errors.   . Please bring all of your medications to every appointment so we can make sure that our medication list is the same as yours.

## 2024-06-23 NOTE — Progress Notes (Signed)
 Established patient visit   Patient: Ian Sanders   DOB: [birth year]   83 y.o. Male  MRN: [id number] Visit Date: 06/23/2024  Today's healthcare provider: Nancyann Perry, MD   Chief Complaint  Patient presents with   Medical Management of Chronic Issues    Patient reports feeling well   Subjective    Discussed the use of AI scribe software for clinical note transcription with the patient, who gave verbal consent to proceed.  History of Present Illness   Ian Sanders is an 83 year old male with hypertension, prostate cancer and liver cirrhosis who presents for a routine check-up.   He has a history of a broken ankle from late last year and has had one fall since, requiring assistance from the fire department. He does not have a medical alert system and is not interested in obtaining one.  He denies any stomach swelling or fluid buildup, despite his history of cirrhosis. He notes being 'more gut than legs', but does not consider this a new issue.  He is currently taking Ambien  at night and has at least a two-week supply of his medications.  He is concerned about his sodium levels, which were low during rehabilitation for his broken ankle, requiring saline pills. Most recent blood work showed normal sodium and potassium levels.     Last metabolic panel Lab Results  Component Value Date   GLUCOSE 123 (H) 04/30/2024   NA 137 04/30/2024   K 4.0 04/30/2024   CL 102 04/30/2024   CO2 27 04/30/2024   BUN 19 04/30/2024   CREATININE 0.72 04/30/2024   GFRNONAA >60 04/30/2024   CALCIUM  9.2 04/30/2024   PHOS 3.9 04/29/2015   PROT 7.2 04/30/2024   ALBUMIN 3.9 04/30/2024   LABGLOB 2.6 12/20/2023   AGRATIO 2.1 11/21/2021   BILITOT 1.1 04/30/2024   ALKPHOS 79 04/30/2024   AST 27 04/30/2024   ALT 14 04/30/2024   ANIONGAP 8 04/30/2024    Last CBC Lab Results  Component Value Date   WBC 2.4 (L) 04/30/2024   HGB 11.2 (L) 04/30/2024   HCT 34.8 (L) 04/30/2024   MCV 81.1  04/30/2024   MCH 26.1 04/30/2024   RDW 14.1 04/30/2024   PLT 45 (L) 04/30/2024     Medications: Outpatient Medications Prior to Visit  Medication Sig   calcium -vitamin D  (OSCAL) 250-125 MG-UNIT per tablet Take 1 tablet by mouth 2 (two) times daily.    cyanocobalamin  1000 MCG tablet Take 1 tablet by mouth daily.   cyclobenzaprine  (FLEXERIL ) 5 MG tablet Take 1 tablet (5 mg total) by mouth 3 (three) times daily as needed for muscle spasms.   diltiazem  (CARDIZEM  CD) 120 MG 24 hr capsule Take 120 mg by mouth daily.   Ferrous Sulfate  134 MG TABS Take 1 tablet by mouth daily.   flecainide  (TAMBOCOR ) 50 MG tablet Take 1 tablet (50 mg total) by mouth 2 (two) times daily.   gabapentin  (NEURONTIN ) 100 MG capsule Take one in the morning, one in the afternoon, and three at night   Leuprolide  Acetate, 6 Month, (LUPRON ) 45 MG injection Inject 45 mg into the muscle every 6 (six) months.   losartan  (COZAAR ) 25 MG tablet Take 1 tablet by mouth 2 (two) times daily.   meclizine  (ANTIVERT ) 25 MG tablet Take 1 tablet by mouth daily.   omeprazole  (PRILOSEC) 40 MG capsule Take 40 mg by mouth daily.   polyvinyl alcohol  (LIQUIFILM TEARS) 1.4 % ophthalmic solution 1 drop  as needed.   propranolol  (INDERAL ) 10 MG tablet Take 1 tablet by mouth 2 (two) times daily.   Vibegron  (GEMTESA ) 75 MG TABS Take 1 tablet (75 mg total) by mouth daily.   zolpidem  (AMBIEN ) 10 MG tablet TAKE ONE-HALF TO ONE TABLET AT BEDTIME.   trospium  (SANCTURA ) 20 MG tablet Take 1 tablet (20 mg total) by mouth 2 (two) times daily. (Patient not taking: Reported on 06/23/2024)   No facility-administered medications prior to visit.   Review of Systems  Constitutional:  Negative for appetite change, chills and fever.  Respiratory:  Negative for chest tightness, shortness of breath and wheezing.   Cardiovascular:  Negative for chest pain and palpitations.  Gastrointestinal:  Negative for abdominal pain, nausea and vomiting.       Objective     BP 132/65 (BP Location: Left Arm, Patient Position: Sitting, Cuff Size: Normal)   Pulse 61   Temp (!) 97.3 F (36.3 C) (Oral)   Ht 5' 10 (1.778 m)   Wt 143 lb 8 oz (65.1 kg)   SpO2 97%   BMI 20.59 kg/m   Physical Exam   General: Appearance:    Well developed, well nourished male in no acute distress  Eyes:    PERRL, conjunctiva/corneas clear, EOM's intact       Lungs:     Clear to auscultation bilaterally, respirations unlabored  Heart:    Normal heart rate. Regular rhythm.  2/6 systolic murmur   Neurologic:   Awake, alert, oriented x 3. No apparent focal neurological defect.       Assessment & Plan     1. Primary hypertension (Primary) Well controlled.  Continue current medications.    2. Cirrhosis of liver without ascites, unspecified hepatic cirrhosis type (HCC) Stable. Not currently followed by GI. Consider HCC screening at follow up, currently being treated for prostate cancer.   3. Aortic atherosclerosis (HCC)    Return in about 6 months (around 12/24/2024) for Hypertension.     Nancyann Perry, MD  [facility] [phone] (phone) [phone] (fax)  [facility]

## 2024-07-06 DIAGNOSIS — H353221 Exudative age-related macular degeneration, left eye, with active choroidal neovascularization: Secondary | ICD-10-CM | POA: Diagnosis not present

## 2024-07-11 ENCOUNTER — Encounter: Payer: Self-pay | Admitting: Urology

## 2024-07-11 ENCOUNTER — Ambulatory Visit (INDEPENDENT_AMBULATORY_CARE_PROVIDER_SITE_OTHER): Admitting: Urology

## 2024-07-11 VITALS — BP 164/76 | HR 72 | Ht 69.0 in | Wt 146.0 lb

## 2024-07-11 DIAGNOSIS — C61 Malignant neoplasm of prostate: Secondary | ICD-10-CM | POA: Diagnosis not present

## 2024-07-11 DIAGNOSIS — N3941 Urge incontinence: Secondary | ICD-10-CM

## 2024-07-11 NOTE — Progress Notes (Signed)
 07/11/2024 2:48 PM   Lynwood GORMAN Simpers 05/09/1941 [id number]  Referring provider: [name], MD [address] [place],  [place] [address]  Chief Complaint  Patient presents with   Prostate Cancer    Urological history:  1. Prostate cancer -PSA < 0.1 in 06/2021 -RRP by Dr. Oneita at [facility] in 1995 -PSA remained below 1 until 2014 when it rose to 9.8 ng/mL -started on ADT in 03/15  HPI: DAQUARIUS DUBEAU is a 83 y.o. male presents for 28-month follow-up.  Gemtesa  has worked well for his urinary incontinence though he has pain ~$200 a month Trospium  was not as effective and he had dry mouth and constipation Last PSA February 2025 undetectable at < 0.1 He desires to stop ADT and restart when his PSA begins to rise Denies dysuria, gross hematuria No flank, abdominal or pelvic pain   PMH: Past Medical History:  Diagnosis Date   Atrial fibrillation (HCC)    Basal cell carcinoma    Benign paroxysmal positional vertigo    Cancer (HCC)    Cellulitis of left foot 09/18/2023   Closed fracture of distal end of left fibula with routine healing 09/15/2023   Heart palpitations 02/13/2020   History of chicken pox    History of measles    History of mumps    Hypertension    Mononeuritis of unspecified site    Other seborrheic keratosis    Stroke (HCC) 06/2020   Transient ischemic attack (TIA), and cerebral infarction without residual deficits(V12.54)    Traumatic subdural hematoma without loss of consciousness (HCC) 06/25/2020   Unspecified hypothyroidism     Surgical History: Past Surgical History:  Procedure Laterality Date   Carotid Doppler Ultrasound  04/02/2010   Small amount Calcified plaque bilaterally, no significant stenosis.   CATARACT EXTRACTION W/PHACO Right 04/15/2021   Procedure: CATARACT EXTRACTION PHACO AND INTRAOCULAR LENS PLACEMENT (IOC) RIGHT;  Surgeon: Jaye Fallow, MD;  Location: [facility];  Service: Ophthalmology;   Laterality: Right;  7.98 0:47.2   CATARACT EXTRACTION W/PHACO Left 05/20/2021   Procedure: CATARACT EXTRACTION PHACO AND INTRAOCULAR LENS PLACEMENT (IOC) LEFT 3.37 00:32.0;  Surgeon: Jaye Fallow, MD;  Location: [facility];  Service: Ophthalmology;  Laterality: Left;  Requests arrival 10A or after   COLONOSCOPY  05/11/2014   Tubular Adenoma. Dr. Jinny   COLONOSCOPY WITH PROPOFOL  N/A 05/23/2018   Procedure: COLONOSCOPY WITH PROPOFOL ;  Surgeon: [name] Bi, MD;  Location: [facility];  Service: Gastroenterology;  Laterality: N/A;   DOPPLER ECHOCARDIOGRAPHY  04/02/2010   Mild left atrial dilation. Normal right atrium. No valvular disease. No thrombus. Normal LV function. EF>55%   ESOPHAGOGASTRODUODENOSCOPY (EGD) WITH PROPOFOL  N/A 08/16/2019   Procedure: ESOPHAGOGASTRODUODENOSCOPY (EGD) WITH PROPOFOL ;  Surgeon: [name] Bi, MD;  Location[facility];  Service: Gastroenterology;  Laterality: N/A;   FINGER AMPUTATION     HERNIA REPAIR Right    MRI Brain with and without contrast  03/20/2010   Suggestive of basal ganglia lacunar infarct   PROSTATECTOMY  11/16/1993   Abdominal   SKIN SURGERY     multiple   Thumb surgery      Home Medications:  Allergies as of 07/11/2024       Reactions   Lisinopril Swelling   Tongue swelling   Myrbetriq  [mirabegron ] Swelling   Of the tongue   Beta Adrenergic Blockers    Other Reaction(s): Other (See Comments) Symptomatic bradycardia Patient has been tolerating Propranolol  10 mg twice daily since 10/2022, with no complaints  Other Other (See Comments)   Symptomatic bradycardia    Triamterene    Other reaction(s): ITCHING,WATERING EYES Other Reaction(s): ITCHING,WATERING EYES   Amlodipine Other (See Comments)   Peripheral edema   Atorvastatin Other (See Comments)   Dizziness and Fatigue   Dabigatran Etexilate Mesylate Other (See Comments)   Stomach ulcers (Pradaxa)   Dabigatran Etexilate Mesylate Other (See Comments)   Stomach  ulcers (Pradaxa)   Latex Rash        Medication List        Accurate as of July 11, 2024  2:48 PM. If you have any questions, ask your nurse or doctor.          artificial tears ophthalmic solution 1 drop as needed.   calcium -vitamin D  250-125 MG-UNIT tablet Commonly known as: OSCAL Take 1 tablet by mouth 2 (two) times daily.   cyanocobalamin  1000 MCG tablet Take 1 tablet by mouth daily.   cyclobenzaprine  5 MG tablet Commonly known as: FLEXERIL  Take 1 tablet (5 mg total) by mouth 3 (three) times daily as needed for muscle spasms.   diltiazem  120 MG 24 hr capsule Commonly known as: CARDIZEM  CD Take 120 mg by mouth daily.   Ferrous Sulfate  134 MG Tabs Take 1 tablet by mouth daily.   flecainide  50 MG tablet Commonly known as: TAMBOCOR  Take 1 tablet (50 mg total) by mouth 2 (two) times daily.   gabapentin  100 MG capsule Commonly known as: NEURONTIN  Take one in the morning, one in the afternoon, and three at night   Gemtesa  75 MG Tabs Generic drug: Vibegron  Take 1 tablet (75 mg total) by mouth daily.   leuprolide  (6 Month) 45 MG injection Commonly known as: LUPRON  Inject 45 mg into the muscle every 6 (six) months.   losartan  25 MG tablet Commonly known as: COZAAR  Take 1 tablet by mouth 2 (two) times daily.   meclizine  25 MG tablet Commonly known as: ANTIVERT  Take 1 tablet by mouth daily.   omeprazole  40 MG capsule Commonly known as: PRILOSEC Take 40 mg by mouth daily.   propranolol  10 MG tablet Commonly known as: INDERAL  Take 1 tablet by mouth 2 (two) times daily.   trospium  20 MG tablet Commonly known as: SANCTURA  Take 1 tablet (20 mg total) by mouth 2 (two) times daily.   zolpidem  10 MG tablet Commonly known as: AMBIEN  TAKE ONE-HALF TO ONE TABLET AT BEDTIME.        Allergies:  Allergies  Allergen Reactions   Lisinopril Swelling    Tongue swelling   Myrbetriq  [Mirabegron ] Swelling    Of the tongue   Beta Adrenergic Blockers      Other Reaction(s): Other (See Comments)  Symptomatic bradycardia Patient has been tolerating Propranolol  10 mg twice daily since 10/2022, with no complaints   Other Other (See Comments)    Symptomatic bradycardia    Triamterene     Other reaction(s): ITCHING,WATERING EYES  Other Reaction(s): ITCHING,WATERING EYES   Amlodipine Other (See Comments)    Peripheral edema   Atorvastatin Other (See Comments)    Dizziness and Fatigue   Dabigatran Etexilate Mesylate Other (See Comments)    Stomach ulcers (Pradaxa)   Dabigatran Etexilate Mesylate Other (See Comments)    Stomach ulcers (Pradaxa)   Latex Rash    Family History: Family History  Problem Relation Age of Onset   Heart disease Mother 29       CABG   Hypertension Mother    Heart attack Mother    Hypertension  Brother    Heart disease Sister        stents   Prostate cancer Brother    Kidney cancer Brother    Skin cancer Brother    Hypertension Brother    Bladder Cancer Sister    Leukemia Father    Stroke Father        multiple   Cerebral aneurysm Sister    Kidney cancer Brother     Social History:  reports that he has quit smoking. His smoking use included cigarettes. His smokeless tobacco use includes chew. He reports that he does not currently use alcohol  after a past usage of about 5.0 standard drinks of alcohol  per week. He reports that he does not use drugs.   Physical Exam: BP (!) 164/76   Pulse 72   Ht 5' 9 (1.753 m)   Wt 146 lb (66.2 kg)   BMI 21.56 kg/m   Constitutional:  Alert, No acute distress. HEENT: [place] AT Respiratory: Normal respiratory effort, no increased work of breathing. Psychiatric: Normal mood and affect.   Assessment & Plan:    1. Prostate cancer (HCC) (Primary) PSA drawn today Follow-up 6 months with PSA  2.  Urge incontinence Continue Gemtesa  The GoodRx mirabegron  price is > $300 per month   Glendia JAYSON Barba, MD  [facility] [address], [address] [place], [place] [address] [phone]

## 2024-07-12 LAB — PSA: Prostate Specific Ag, Serum: <0.1 ng/mL (ref 0.0–4.0)

## 2024-07-13 ENCOUNTER — Ambulatory Visit: Payer: HMO | Admitting: Urology

## 2024-07-16 ENCOUNTER — Ambulatory Visit: Payer: Self-pay | Admitting: Urology

## 2024-07-25 ENCOUNTER — Other Ambulatory Visit (HOSPITAL_COMMUNITY): Payer: Self-pay

## 2024-07-26 DIAGNOSIS — H353211 Exudative age-related macular degeneration, right eye, with active choroidal neovascularization: Secondary | ICD-10-CM | POA: Diagnosis not present

## 2024-08-05 ENCOUNTER — Other Ambulatory Visit: Payer: Self-pay | Admitting: Physician Assistant

## 2024-08-05 ENCOUNTER — Other Ambulatory Visit: Payer: Self-pay | Admitting: Family Medicine

## 2024-08-05 DIAGNOSIS — G4700 Insomnia, unspecified: Secondary | ICD-10-CM

## 2024-08-05 DIAGNOSIS — N393 Stress incontinence (female) (male): Secondary | ICD-10-CM

## 2024-08-07 NOTE — Telephone Encounter (Signed)
 Requested medication (s) are due for refill today: yes  Requested medication (s) are on the active medication list: yes  Last refill:  06/08/24 #30 1 RF  Future visit scheduled: yes  Notes to clinic:  med not delegated to NT to RF   Requested Prescriptions  Pending Prescriptions Disp Refills   zolpidem  (AMBIEN ) 10 MG tablet [Pharmacy Med Name: ZOLPIDEM  TARTRATE 10 MG TABLET] 30 tablet 0    Sig: TAKE ONE-HALF TO ONE TABLET AT BEDTIME.     Not Delegated - Psychiatry:  Anxiolytics/Hypnotics Failed - 08/07/2024  2:36 PM      Failed - This refill cannot be delegated      Failed - Urine Drug Screen completed in last 360 days      Passed - Valid encounter within last 6 months    Recent Outpatient Visits           1 month ago Primary hypertension   [facility] [name], MD       Future Appointments             In 5 months Stoioff, Glendia BROCKS, MD [facility] [place]

## 2024-08-08 ENCOUNTER — Telehealth: Payer: Self-pay

## 2024-08-08 NOTE — Telephone Encounter (Signed)
 LOV 06/23/2024 NOV 12/25/2024 LRF 06/08/2024 30 x 1

## 2024-08-24 DIAGNOSIS — H353221 Exudative age-related macular degeneration, left eye, with active choroidal neovascularization: Secondary | ICD-10-CM | POA: Diagnosis not present

## 2024-08-27 ENCOUNTER — Other Ambulatory Visit: Payer: Self-pay

## 2024-08-27 ENCOUNTER — Emergency Department
Admission: EM | Admit: 2024-08-27 | Discharge: 2024-08-27 | Disposition: A | Discharge status: Home / Self Care | Attending: Emergency Medicine | Admitting: Emergency Medicine

## 2024-08-27 DIAGNOSIS — N390 Urinary tract infection, site not specified: Secondary | ICD-10-CM | POA: Diagnosis not present

## 2024-08-27 DIAGNOSIS — R300 Dysuria: Secondary | ICD-10-CM

## 2024-08-27 LAB — CBC
HCT: 35.2 % — ABNORMAL LOW (ref 39.0–52.0)
Hemoglobin: 11.3 g/dL — ABNORMAL LOW (ref 13.0–17.0)
MCH: 26.2 pg (ref 26.0–34.0)
MCHC: 32.1 g/dL (ref 30.0–36.0)
MCV: 81.7 fL (ref 80.0–100.0)
Platelets: 44 K/uL — ABNORMAL LOW (ref 150–400)
RBC: 4.31 MIL/uL (ref 4.22–5.81)
RDW: 13.6 % (ref 11.5–15.5)
WBC: 4.1 K/uL (ref 4.0–10.5)
nRBC: 0 % (ref 0.0–0.2)

## 2024-08-27 LAB — URINALYSIS, ROUTINE W REFLEX MICROSCOPIC
Bilirubin Urine: NEGATIVE
Glucose, UA: NEGATIVE mg/dL
Ketones, ur: NEGATIVE mg/dL
Nitrite: NEGATIVE
Protein, ur: 30 mg/dL — AB
RBC / HPF: >50 RBC/hpf (ref 0–5)
Specific Gravity, Urine: 1.012 (ref 1.005–1.030)
Squamous Epithelial / HPF: 0 /HPF (ref 0–5)
WBC, UA: >50 WBC/hpf (ref 0–5)
pH: 5 (ref 5.0–8.0)

## 2024-08-27 LAB — BASIC METABOLIC PANEL WITH GFR
Anion gap: 12 (ref 5–15)
BUN: 12 mg/dL (ref 8–23)
CO2: 22 mmol/L (ref 22–32)
Calcium: 8.9 mg/dL (ref 8.9–10.3)
Chloride: 100 mmol/L (ref 98–111)
Creatinine, Ser: 0.81 mg/dL (ref 0.61–1.24)
GFR, Estimated: >60 mL/min (ref >60)
Glucose, Bld: 157 mg/dL — ABNORMAL HIGH (ref 70–99)
Potassium: 3.9 mmol/L (ref 3.5–5.1)
Sodium: 134 mmol/L — ABNORMAL LOW (ref 135–145)

## 2024-08-27 MED ORDER — CEFUROXIME AXETIL 250 MG PO TABS: 250.0000 mg | ORAL_TABLET | Freq: Two times a day (BID) | ORAL | 0 refills | Status: DC

## 2024-08-27 MED ORDER — CEFUROXIME AXETIL 250 MG PO TABS
250.0000 mg | ORAL_TABLET | Freq: Once | ORAL | Status: AC
  Administered 2024-08-27: 250 mg via ORAL
  Filled 2024-08-27: qty 1

## 2024-08-27 NOTE — ED Notes (Signed)
 This RN assisted pt to stand at bedside to use urinal. Urine sample collected.

## 2024-08-27 NOTE — ED Provider Notes (Signed)
 [facility] Provider Note   Event Date/Time   First MD Initiated Contact with Patient 08/27/24 1216     (approximate) History  Urinary Tract Infection  HPI Ian Sanders is a 83 y.o. male who presents complaining of dysuria over the last 48 hours.  The patient states that he has felt similarly in the past when he has had a urinary tract infection. ROS: Patient currently denies any vision changes, tinnitus, difficulty speaking, facial droop, sore throat, chest pain, shortness of breath, abdominal pain, nausea/vomiting/diarrhea, or weakness/numbness/paresthesias in any extremity   Physical Exam  Triage Vital Signs: ED Triage Vitals  Encounter Vitals Group     BP 08/27/24 1209 (!) 151/56     Girls Systolic BP Percentile --      Girls Diastolic BP Percentile --      Boys Systolic BP Percentile --      Boys Diastolic BP Percentile --      Pulse Rate 08/27/24 1209 67     Resp 08/27/24 1209 18     Temp 08/27/24 1209 98.3 F (36.8 C)     Temp Source 08/27/24 1209 Oral     SpO2 08/27/24 1209 98 %     Weight 08/27/24 1210 145 lb (65.8 kg)     Height 08/27/24 1210 5' 9 (1.753 m)     Head Circumference --      Peak Flow --      Pain Score 08/27/24 1209 8     Pain Loc --      Pain Education --      Exclude from Growth Chart --    Most recent vital signs: Vitals:   08/27/24 1300 08/27/24 1403  BP: 123/88 125/87  Pulse: 67 68  Resp: 18 18  Temp:  98.2 F (36.8 C)  SpO2: 96% 97%   General: Awake, oriented x4. CV:  Good peripheral perfusion. Resp:  Normal effort. Abd:  No distention. Other:  Elderly well-developed, well-nourished Caucasian male resting comfortably in no acute distress ED Results / Procedures / Treatments  Labs (all labs ordered are listed, but only abnormal results are displayed) Labs Reviewed  URINALYSIS, ROUTINE W REFLEX MICROSCOPIC - Abnormal; Notable for the following components:      Result Value   Color, Urine YELLOW (*)     APPearance CLOUDY (*)    Hgb urine dipstick LARGE (*)    Protein, ur 30 (*)    Leukocytes,Ua MODERATE (*)    Bacteria, UA RARE (*)    Non Squamous Epithelial PRESENT (*)    All other components within normal limits  BASIC METABOLIC PANEL WITH GFR - Abnormal; Notable for the following components:   Sodium 134 (*)    Glucose, Bld 157 (*)    All other components within normal limits  CBC - Abnormal; Notable for the following components:   Hemoglobin 11.3 (*)    HCT 35.2 (*)    Platelets 44 (*)    All other components within normal limits   PROCEDURES: Critical Care performed: No Procedures MEDICATIONS ORDERED IN ED: Medications  cefUROXime (CEFTIN) tablet 250 mg (250 mg Oral Given 08/27/24 1351)   IMPRESSION / MDM / ASSESSMENT AND PLAN / ED COURSE  I reviewed the triage vital signs and the nursing notes.                             The patient is on the cardiac monitor  to evaluate for evidence of arrhythmia and/or significant heart rate changes. Patient's presentation is most consistent with acute presentation with potential threat to life or bodily function. Patient is an 83 year old male who presents with her dysuria over the last 48 hours with concerns for urinary tract infection. No e/o epididymo-orchitis on exam and low suspicion for rectal abscess, prostatitis, other GU deep space infection, gonorrhea/chlamydia. Unlikely Infected Urolithiasis, AAA, cholecystitis, pancreatitis, SBO, appendicitis, or other acute abdomen. Workup: UA: Moderate leukocytes, rare bacteria, and large hemoglobin concerning for urinary tract infection Rx: Ceftin 250 mg twice daily x5 days  Disposition: Discharge home. SRP discussed. Advise follow up with primary care provider within 24-72 hours.   FINAL CLINICAL IMPRESSION(S) / ED DIAGNOSES   Final diagnoses:  Urinary tract infection with hematuria, site unspecified  Dysuria   Rx / DC Orders   ED Discharge Orders          Ordered     cefUROXime (CEFTIN) 250 MG tablet  2 times daily with meals,   Status:  Discontinued        08/27/24 1331    cefUROXime (CEFTIN) 250 MG tablet  2 times daily with meals        08/27/24 1332           Note:  This document was prepared using Dragon voice recognition software and may include unintentional dictation errors.   Lance Galas K, MD 08/27/24 1537

## 2024-08-27 NOTE — ED Triage Notes (Signed)
 Pt to ED via POV from home for possible UTI. Pt states it burns when he urinates and is having to go often with minimal amount of urine. Pt denies pain anywhere else, denies SOB.

## 2024-08-27 NOTE — ED Notes (Signed)
 This RN attempted to collect urine sample. Pt states that they do not have to urinate at this time. Instructed pt to press his call bell when he feels the need to go. Call light within reach.

## 2024-09-15 ENCOUNTER — Other Ambulatory Visit: Payer: Self-pay | Admitting: Family Medicine

## 2024-09-15 DIAGNOSIS — G4700 Insomnia, unspecified: Secondary | ICD-10-CM

## 2024-09-21 ENCOUNTER — Emergency Department

## 2024-09-21 ENCOUNTER — Other Ambulatory Visit: Payer: Self-pay

## 2024-09-21 ENCOUNTER — Inpatient Hospital Stay
Admission: EM | Admit: 2024-09-21 | Discharge: 2024-09-27 | DRG: 371 | Disposition: A | Discharge status: Home Health | Attending: Student | Admitting: Student

## 2024-09-21 DIAGNOSIS — A044 Other intestinal Escherichia coli infections: Principal | ICD-10-CM | POA: Diagnosis present

## 2024-09-21 DIAGNOSIS — Z8546 Personal history of malignant neoplasm of prostate: Secondary | ICD-10-CM

## 2024-09-21 DIAGNOSIS — Z9079 Acquired absence of other genital organ(s): Secondary | ICD-10-CM

## 2024-09-21 DIAGNOSIS — Z79899 Other long term (current) drug therapy: Secondary | ICD-10-CM

## 2024-09-21 DIAGNOSIS — N281 Cyst of kidney, acquired: Secondary | ICD-10-CM | POA: Diagnosis not present

## 2024-09-21 DIAGNOSIS — Z8249 Family history of ischemic heart disease and other diseases of the circulatory system: Secondary | ICD-10-CM | POA: Diagnosis not present

## 2024-09-21 DIAGNOSIS — I482 Chronic atrial fibrillation, unspecified: Secondary | ICD-10-CM | POA: Diagnosis not present

## 2024-09-21 DIAGNOSIS — G608 Other hereditary and idiopathic neuropathies: Secondary | ICD-10-CM

## 2024-09-21 DIAGNOSIS — I10 Essential (primary) hypertension: Secondary | ICD-10-CM | POA: Diagnosis present

## 2024-09-21 DIAGNOSIS — E039 Hypothyroidism, unspecified: Secondary | ICD-10-CM | POA: Diagnosis not present

## 2024-09-21 DIAGNOSIS — W19XXXA Unspecified fall, initial encounter: Secondary | ICD-10-CM | POA: Diagnosis not present

## 2024-09-21 DIAGNOSIS — Z5941 Food insecurity: Secondary | ICD-10-CM

## 2024-09-21 DIAGNOSIS — Z1152 Encounter for screening for COVID-19: Secondary | ICD-10-CM | POA: Diagnosis not present

## 2024-09-21 DIAGNOSIS — R188 Other ascites: Secondary | ICD-10-CM | POA: Diagnosis not present

## 2024-09-21 DIAGNOSIS — R531 Weakness: Secondary | ICD-10-CM | POA: Diagnosis not present

## 2024-09-21 DIAGNOSIS — K8020 Calculus of gallbladder without cholecystitis without obstruction: Secondary | ICD-10-CM | POA: Diagnosis not present

## 2024-09-21 DIAGNOSIS — K219 Gastro-esophageal reflux disease without esophagitis: Secondary | ICD-10-CM | POA: Diagnosis not present

## 2024-09-21 DIAGNOSIS — E876 Hypokalemia: Secondary | ICD-10-CM | POA: Diagnosis not present

## 2024-09-21 DIAGNOSIS — D696 Thrombocytopenia, unspecified: Secondary | ICD-10-CM | POA: Diagnosis not present

## 2024-09-21 DIAGNOSIS — Z91040 Latex allergy status: Secondary | ICD-10-CM | POA: Diagnosis not present

## 2024-09-21 DIAGNOSIS — D61818 Other pancytopenia: Secondary | ICD-10-CM | POA: Diagnosis present

## 2024-09-21 DIAGNOSIS — Z888 Allergy status to other drugs, medicaments and biological substances status: Secondary | ICD-10-CM | POA: Diagnosis not present

## 2024-09-21 DIAGNOSIS — E871 Hypo-osmolality and hyponatremia: Secondary | ICD-10-CM | POA: Diagnosis present

## 2024-09-21 DIAGNOSIS — Z8042 Family history of malignant neoplasm of prostate: Secondary | ICD-10-CM

## 2024-09-21 DIAGNOSIS — G629 Polyneuropathy, unspecified: Secondary | ICD-10-CM | POA: Diagnosis present

## 2024-09-21 DIAGNOSIS — K529 Noninfective gastroenteritis and colitis, unspecified: Principal | ICD-10-CM | POA: Diagnosis present

## 2024-09-21 DIAGNOSIS — Z85828 Personal history of other malignant neoplasm of skin: Secondary | ICD-10-CM | POA: Diagnosis not present

## 2024-09-21 DIAGNOSIS — Z961 Presence of intraocular lens: Secondary | ICD-10-CM | POA: Diagnosis present

## 2024-09-21 DIAGNOSIS — I81 Portal vein thrombosis: Secondary | ICD-10-CM | POA: Diagnosis not present

## 2024-09-21 DIAGNOSIS — Z5948 Other specified lack of adequate food: Secondary | ICD-10-CM

## 2024-09-21 DIAGNOSIS — R296 Repeated falls: Secondary | ICD-10-CM | POA: Diagnosis present

## 2024-09-21 DIAGNOSIS — Z806 Family history of leukemia: Secondary | ICD-10-CM

## 2024-09-21 DIAGNOSIS — K7460 Unspecified cirrhosis of liver: Secondary | ICD-10-CM | POA: Diagnosis present

## 2024-09-21 DIAGNOSIS — Z8051 Family history of malignant neoplasm of kidney: Secondary | ICD-10-CM

## 2024-09-21 DIAGNOSIS — Z8673 Personal history of transient ischemic attack (TIA), and cerebral infarction without residual deficits: Secondary | ICD-10-CM | POA: Diagnosis not present

## 2024-09-21 DIAGNOSIS — Z9842 Cataract extraction status, left eye: Secondary | ICD-10-CM

## 2024-09-21 DIAGNOSIS — R059 Cough, unspecified: Secondary | ICD-10-CM | POA: Diagnosis not present

## 2024-09-21 DIAGNOSIS — I480 Paroxysmal atrial fibrillation: Secondary | ICD-10-CM | POA: Diagnosis not present

## 2024-09-21 DIAGNOSIS — Z808 Family history of malignant neoplasm of other organs or systems: Secondary | ICD-10-CM

## 2024-09-21 DIAGNOSIS — I82890 Acute embolism and thrombosis of other specified veins: Secondary | ICD-10-CM | POA: Diagnosis not present

## 2024-09-21 DIAGNOSIS — Z720 Tobacco use: Secondary | ICD-10-CM

## 2024-09-21 DIAGNOSIS — Z9841 Cataract extraction status, right eye: Secondary | ICD-10-CM

## 2024-09-21 DIAGNOSIS — R1084 Generalized abdominal pain: Secondary | ICD-10-CM | POA: Diagnosis not present

## 2024-09-21 DIAGNOSIS — Z823 Family history of stroke: Secondary | ICD-10-CM

## 2024-09-21 DIAGNOSIS — Z8052 Family history of malignant neoplasm of bladder: Secondary | ICD-10-CM

## 2024-09-21 LAB — CBC
HCT: 38.9 % — ABNORMAL LOW (ref 39.0–52.0)
Hemoglobin: 13.1 g/dL (ref 13.0–17.0)
MCH: 26.2 pg (ref 26.0–34.0)
MCHC: 33.7 g/dL (ref 30.0–36.0)
MCV: 77.8 fL — ABNORMAL LOW (ref 80.0–100.0)
Platelets: 71 K/uL — ABNORMAL LOW (ref 150–400)
RBC: 5 MIL/uL (ref 4.22–5.81)
RDW: 14 % (ref 11.5–15.5)
WBC: 16.4 K/uL — ABNORMAL HIGH (ref 4.0–10.5)
nRBC: 0 % (ref 0.0–0.2)

## 2024-09-21 LAB — URINALYSIS, ROUTINE W REFLEX MICROSCOPIC
Bilirubin Urine: NEGATIVE
Glucose, UA: NEGATIVE mg/dL
Ketones, ur: NEGATIVE mg/dL
Leukocytes,Ua: NEGATIVE
Nitrite: NEGATIVE
Protein, ur: 100 mg/dL — AB
Specific Gravity, Urine: 1.027 (ref 1.005–1.030)
pH: 5 (ref 5.0–8.0)

## 2024-09-21 LAB — COMPREHENSIVE METABOLIC PANEL WITH GFR
ALT: 14 U/L (ref 0–44)
AST: 27 U/L (ref 15–41)
Albumin: 3.8 g/dL (ref 3.5–5.0)
Alkaline Phosphatase: 77 U/L (ref 38–126)
Anion gap: 9 (ref 5–15)
BUN: 21 mg/dL (ref 8–23)
CO2: 23 mmol/L (ref 22–32)
Calcium: 8.9 mg/dL (ref 8.9–10.3)
Chloride: 100 mmol/L (ref 98–111)
Creatinine, Ser: 0.63 mg/dL (ref 0.61–1.24)
GFR, Estimated: >60 mL/min (ref >60)
Glucose, Bld: 116 mg/dL — ABNORMAL HIGH (ref 70–99)
Potassium: 4 mmol/L (ref 3.5–5.1)
Sodium: 132 mmol/L — ABNORMAL LOW (ref 135–145)
Total Bilirubin: 2.8 mg/dL — ABNORMAL HIGH (ref 0.0–1.2)
Total Protein: 7.2 g/dL (ref 6.5–8.1)

## 2024-09-21 LAB — RESP PANEL BY RT-PCR (RSV, FLU A&B, COVID)  RVPGX2
Influenza A by PCR: NEGATIVE
Influenza B by PCR: NEGATIVE
Resp Syncytial Virus by PCR: NEGATIVE
SARS Coronavirus 2 by RT PCR: NEGATIVE

## 2024-09-21 LAB — TROPONIN I (HIGH SENSITIVITY): Troponin I (High Sensitivity): 7 ng/L (ref <18)

## 2024-09-21 LAB — LACTIC ACID, PLASMA: Lactic Acid, Venous: 0.8 mmol/L (ref 0.5–1.9)

## 2024-09-21 LAB — LIPASE, BLOOD: Lipase: 25 U/L (ref 11–51)

## 2024-09-21 MED ORDER — SODIUM CHLORIDE 0.9 % IV BOLUS
500.0000 mL | Freq: Once | INTRAVENOUS | Status: AC
  Administered 2024-09-21: 500 mL via INTRAVENOUS

## 2024-09-21 MED ORDER — IOHEXOL 300 MG/ML  SOLN
100.0000 mL | Freq: Once | INTRAMUSCULAR | Status: AC | PRN
  Administered 2024-09-21: 100 mL via INTRAVENOUS

## 2024-09-21 MED ORDER — PIPERACILLIN-TAZOBACTAM 3.375 G IVPB 30 MIN
3.3750 g | Freq: Once | INTRAVENOUS | Status: AC
  Administered 2024-09-21: 3.375 g via INTRAVENOUS
  Filled 2024-09-21 (×2): qty 50

## 2024-09-21 NOTE — ED Provider Notes (Addendum)
 [facility] Provider Note    Event Date/Time   First MD Initiated Contact with Patient 09/21/24 2016     (approximate)   History   Abdominal Pain and Fall   HPI  Ian Sanders is a 83 y.o. male with a history of atrial fibrillation, hypertension, CVA, and BPPV who presents with abdominal pain since yesterday, initially in the lower abdomen, but now coming up to the upper abdomen on both sides.  He reports associated generalized weakness, feeling so weak today that he could barely walk with a walker.  He has had some nausea but no vomiting.  He denies diarrhea and states he is constipated.  He has hematuria but no dysuria.  He was treated for UTI about 2 weeks ago but feels like he is still having some the same symptoms.  He denies any fever or chills.  I reviewed the past medical records.  The patient was seen in the ED on 10/12 and diagnosed with a UTI, started on cefuroxime.  Prior to this, his most recent outpatient encounter was on 8/26 with urology for follow-up of prostate cancer.   Physical Exam   Triage Vital Signs: ED Triage Vitals  Encounter Vitals Group     BP 09/21/24 1707 123/67     Girls Systolic BP Percentile --      Girls Diastolic BP Percentile --      Boys Systolic BP Percentile --      Boys Diastolic BP Percentile --      Pulse Rate 09/21/24 1707 74     Resp 09/21/24 1707 19     Temp 09/21/24 1707 99.7 F (37.6 C)     Temp Source 09/21/24 1707 Oral     SpO2 09/21/24 1707 94 %     Weight 09/21/24 1707 132 lb 11.5 oz (60.2 kg)     Height 09/21/24 1705 5' 9 (1.753 m)     Head Circumference --      Peak Flow --      Pain Score 09/21/24 1705 3     Pain Loc --      Pain Education --      Exclude from Growth Chart --     Most recent vital signs: Vitals:   09/21/24 2030 09/21/24 2100  BP: 119/84 138/66  Pulse: 75 75  Resp:    Temp:  98.7 F (37.1 C)  SpO2: 95% 94%     General: Alert, somewhat weak appearing, no  distress.  CV:  Good peripheral perfusion.  Resp:  Lungs CTAB.  Normal effort.  Abd:  Soft with mild bilateral lower quadrant tenderness.  No distention.  Other:  Dry mucous membranes.  Motor intact in all extremities.  Normal speech.   ED Results / Procedures / Treatments   Labs (all labs ordered are listed, but only abnormal results are displayed) Labs Reviewed  COMPREHENSIVE METABOLIC PANEL WITH GFR - Abnormal; Notable for the following components:      Result Value   Sodium 132 (*)    Glucose, Bld 116 (*)    Total Bilirubin 2.8 (*)    All other components within normal limits  CBC - Abnormal; Notable for the following components:   WBC 16.4 (*)    HCT 38.9 (*)    MCV 77.8 (*)    Platelets 71 (*)    All other components within normal limits  URINALYSIS, ROUTINE W REFLEX MICROSCOPIC - Abnormal; Notable for the following components:  Color, Urine AMBER (*)    APPearance HAZY (*)    Hgb urine dipstick SMALL (*)    Protein, ur 100 (*)    Bacteria, UA RARE (*)    All other components within normal limits  RESP PANEL BY RT-PCR (RSV, FLU A&B, COVID)  RVPGX2  LIPASE, BLOOD  LACTIC ACID, PLASMA  LACTIC ACID, PLASMA  TROPONIN I (HIGH SENSITIVITY)  TROPONIN I (HIGH SENSITIVITY)     EKG  ED ECG REPORT I, Waylon Cassis, the attending physician, personally viewed and interpreted this ECG.  Date: 09/21/2024 EKG Time: 2058 Rate: 75 Rhythm: normal sinus rhythm QRS Axis: normal Intervals: normal ST/T Wave abnormalities: normal Narrative Interpretation: no evidence of acute ischemia    RADIOLOGY  CT abdomen/pelvis: I independently viewed and interpreted the images; there are inflammatory changes in the colon consistent with colitis.  Radiology report indicates the following:  IMPRESSION:  1. 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  2. Cholelithiasis.  3. Findings consistent with colitis involving the cecum and  ascending colon.  4. Small amount of  pelvic free fluid.  5. Multiple bilateral simple renal cysts.  6. Chronic compression fracture deformity at the level of L1.  7. Aortic atherosclerosis.    PROCEDURES:  Critical Care performed: No  Procedures   MEDICATIONS ORDERED IN ED: Medications  piperacillin-tazobactam (ZOSYN) IVPB 3.375 g (has no administration in time range)  sodium chloride  0.9 % bolus 500 mL (0 mLs Intravenous Stopped 09/21/24 2138)  iohexol  (OMNIPAQUE ) 300 MG/ML solution 100 mL (100 mLs Intravenous Contrast Given 09/21/24 2123)     IMPRESSION / MDM / ASSESSMENT AND PLAN / ED COURSE  I reviewed the triage vital signs and the nursing notes.  83 year old male with PMH as noted above presents with lower abdominal pain and generalized weakness.  On exam his temperature is borderline elevated.  Vital signs are otherwise normal.  He has some bilateral lower quadrant tenderness.  Initial labs obtained from triage are significant for CBC showing leukocytosis.  CMP is unremarkable.  Lipase is negative.  Urinalysis shows some RBCs but no evidence to suggest infection.  Differential diagnosis includes, but is not limited to, colitis, diverticulitis, gastroenteritis, other acute intra-abdominal cause, viral syndrome, acute bronchitis, pneumonia, UTI, pyelonephritis, ureteral stone.  I have added on a troponin, lactate, respiratory panel, and a CT abdomen/pelvis for further evaluation.  Patient's presentation is most consistent with acute presentation with potential threat to life or bodily function.  The patient is on the cardiac monitor to evaluate for evidence of arrhythmia and/or significant heart rate changes.  ----------------------------------------- 11:23 PM on 09/21/2024 -----------------------------------------  CT shows colitis as well as portal vein thrombosis.  However, the patient has history of subdural hematoma and is currently not on anticoagulation for his A-fib and was told he should not be  anticoagulated.  Therefore, we will hold off on anticoagulation now.  I ordered Zosyn to cover for the colitis.  The patient's vital signs have remained stable.  However, given his age and comorbidities as well as his generalized weakness, he will benefit from inpatient admission for further management.  I consulted Dr. Manfred from the hospitalist service; based on our discussion he agrees to evaluate the patient for admission.   FINAL CLINICAL IMPRESSION(S) / ED DIAGNOSES   Final diagnoses:  Colitis  Portal vein thrombosis     Rx / DC Orders   ED Discharge Orders     None        Note:  This document was prepared using Dragon voice recognition software and may include unintentional dictation errors.    Jacolyn Pae, MD 09/21/24 RAYMUND    Jacolyn Pae, MD 09/21/24 2325

## 2024-09-21 NOTE — ED Notes (Signed)
 Patient transported to CT

## 2024-09-21 NOTE — H&P (Signed)
 History and Physical    Patient: Ian Sanders DOB: 12-26-1940 DOA: 09/21/2024 DOS: the patient was seen and examined on 09/21/2024 PCP: [name], MD  Patient coming from: {Point_of_Origin:26777}  Chief Complaint:  Chief Complaint  Patient presents with   Abdominal Pain   Fall   HPI: Ian Sanders is a 83 y.o. male with medical history significant of ***  Review of Systems: {ROS_Text:26778} Past Medical History:  Diagnosis Date   Atrial fibrillation (HCC)    Basal cell carcinoma    Benign paroxysmal positional vertigo    Cancer (HCC)    Cellulitis of left foot 09/18/2023   Closed fracture of distal end of left fibula with routine healing 09/15/2023   Heart palpitations 02/13/2020   History of chicken pox    History of measles    History of mumps    Hypertension    Mononeuritis of unspecified site    Other seborrheic keratosis    Stroke (HCC) 06/2020   Transient ischemic attack (TIA), and cerebral infarction without residual deficits(V12.54)    Traumatic subdural hematoma without loss of consciousness (HCC) 06/25/2020   Unspecified hypothyroidism    Past Surgical History:  Procedure Laterality Date   Carotid Doppler Ultrasound  04/02/2010   Small amount Calcified plaque bilaterally, no significant stenosis.   CATARACT EXTRACTION W/PHACO Right 04/15/2021   Procedure: CATARACT EXTRACTION PHACO AND INTRAOCULAR LENS PLACEMENT (IOC) RIGHT;  Surgeon: Jaye Fallow, MD;  Location: [facility];  Service: Ophthalmology;  Laterality: Right;  7.98 0:47.2   CATARACT EXTRACTION W/PHACO Left 05/20/2021   Procedure: CATARACT EXTRACTION PHACO AND INTRAOCULAR LENS PLACEMENT (IOC) LEFT 3.37 00:32.0;  Surgeon: Jaye Fallow, MD;  Location: [facility];  Service: Ophthalmology;  Laterality: Left;  Requests arrival 10A or after   COLONOSCOPY  05/11/2014   Tubular Adenoma. Dr. Jinny   COLONOSCOPY WITH PROPOFOL  N/A 05/23/2018   Procedure:  COLONOSCOPY WITH PROPOFOL ;  Surgeon: [name] Bi, MD;  Location: [facility];  Service: Gastroenterology;  Laterality: N/A;   DOPPLER ECHOCARDIOGRAPHY  04/02/2010   Mild left atrial dilation. Normal right atrium. No valvular disease. No thrombus. Normal LV function. EF>55%   ESOPHAGOGASTRODUODENOSCOPY (EGD) WITH PROPOFOL  N/A 08/16/2019   Procedure: ESOPHAGOGASTRODUODENOSCOPY (EGD) WITH PROPOFOL ;  Surgeon: [name] Bi, MD;  Location: [facility] END[facility]COPY;  Service: Gastroenterology;  Laterality: N/A;   FINGER AMPUTATION     HERNIA REPAIR Right    MRI Brain with and without contrast  03/20/2010   Suggestive of basal ganglia lacunar infarct   PROSTATECTOMY  11/16/1993   Abdominal   SKIN SURGERY     multiple   Thumb surgery     Social History:  reports that he has quit smoking. His smoking use included cigarettes. His smokeless tobacco use includes chew. He reports that he does not currently use alcohol  after a past usage of about 5.0 standard drinks of alcohol  per week. He reports that he does not use drugs.  Allergies  Allergen Reactions   Lisinopril Swelling    Tongue swelling   Myrbetriq  [Mirabegron ] Swelling    Of the tongue   Beta Adrenergic Blockers     Other Reaction(s): Other (See Comments)  Symptomatic bradycardia Patient has been tolerating Propranolol  10 mg twice daily since 10/2022, with no complaints   Other Other (See Comments)    Symptomatic bradycardia    Triamterene     Other reaction(s): ITCHING,WATERING EYES  Other Reaction(s): ITCHING,WATERING EYES   Amlodipine Other (See Comments)    Peripheral edema  Atorvastatin Other (See Comments)    Dizziness and Fatigue   Dabigatran Etexilate Mesylate Other (See Comments)    Stomach ulcers (Pradaxa)   Dabigatran Etexilate Mesylate Other (See Comments)    Stomach ulcers (Pradaxa)   Latex Rash    Family History  Problem Relation Age of Onset   Heart disease Mother 42       CABG   Hypertension Mother    Heart  attack Mother    Hypertension Brother    Heart disease Sister        stents   Prostate cancer Brother    Kidney cancer Brother    Skin cancer Brother    Hypertension Brother    Bladder Cancer Sister    Leukemia Father    Stroke Father        multiple   Cerebral aneurysm Sister    Kidney cancer Brother     Prior to Admission medications   Medication Sig Start Date End Date Taking? Authorizing Provider  calcium -vitamin D  (OSCAL) 250-125 MG-UNIT per tablet Take 1 tablet by mouth 2 (two) times daily.     [provider]  cefUROXime (CEFTIN) 250 MG tablet Take 1 tablet (250 mg total) by mouth 2 (two) times daily with a meal. 08/27/24   Bradler, Evan K, MD  cyanocobalamin  1000 MCG tablet Take 1 tablet by mouth daily. 04/24/15   [provider]  cyclobenzaprine  (FLEXERIL ) 5 MG tablet Take 1 tablet (5 mg total) by mouth 3 (three) times daily as needed for muscle spasms. 09/23/23   Lenon Marien CROME, MD  diltiazem  (CARDIZEM  CD) 120 MG 24 hr capsule Take 120 mg by mouth daily. 06/09/22   [provider]  Ferrous Sulfate  134 MG TABS Take 1 tablet by mouth daily.    [provider]  flecainide  (TAMBOCOR ) 50 MG tablet Take 1 tablet (50 mg total) by mouth 2 (two) times daily. 07/15/17   Gollan, Timothy J, MD  gabapentin  (NEURONTIN ) 100 MG capsule Take one in the morning, one in the afternoon, and three at night 01/16/24   [name], MD  GEMTESA  75 MG TABS Take 1 tablet (75 mg total) by mouth daily. 08/07/24   Vaillancourt, Samantha, PA-C  Leuprolide  Acetate, 6 Month, (LUPRON ) 45 MG injection Inject 45 mg into the muscle every 6 (six) months.    [provider]  losartan  (COZAAR ) 25 MG tablet Take 1 tablet by mouth 2 (two) times daily.    [provider]  meclizine  (ANTIVERT ) 25 MG tablet Take 1 tablet by mouth daily.    [provider]  omeprazole  (PRILOSEC) 40 MG capsule Take 40 mg by mouth daily. 09/02/23   [provider]   polyvinyl alcohol  (LIQUIFILM TEARS) 1.4 % ophthalmic solution 1 drop as needed.    [provider]  trospium  (SANCTURA ) 20 MG tablet Take 1 tablet (20 mg total) by mouth 2 (two) times daily. 09/08/23   Stoioff, Glendia BROCKS, MD  zolpidem  (AMBIEN ) 10 MG tablet TAKE ONE-HALF TO ONE TABLET AT BEDTIME. 09/16/24   [name], MD    Physical Exam: Vitals:   09/21/24 1707 09/21/24 2020 09/21/24 2030 09/21/24 2100  BP: 123/67 137/67 119/84 138/66  Pulse: 74 71 75 75  Resp: 19 19    Temp: 99.7 F (37.6 C)   98.7 F (37.1 C)  TempSrc: Oral   Oral  SpO2: 94% 96% 95% 94%  Weight: 60.2 kg     Height:       ***  Data Reviewed: {Tip this will not be part of the note when signed- Document your independent interpretation of telemetry tracing, EKG, lab, Radiology test or any other diagnostic tests. Add any new diagnostic test ordered today. (Optional):26781} {Results:26384}  Assessment and Plan: No notes have been filed under this hospital service. Service: Hospitalist     Advance Care Planning:   Code Status: Prior ***  Consults: ***  Family Communication: ***  Severity of Illness: {Observation/Inpatient:21159}  Author: Posey Maier, DO 09/21/2024 11:24 PM  For on call review [email or web address].

## 2024-09-21 NOTE — ED Triage Notes (Signed)
 Pt arrives via [facility] from home for weakness and 2 falls today. Pt reports hematuria and lower abdominal pain. Pt was treated for uti 2 weeks ago but doesn't think the abx cleared it up. Pt uses walker and/or wheelchair at baseline. Pt unable to use those devices without maximum assistance today.

## 2024-09-22 ENCOUNTER — Encounter: Payer: Self-pay | Admitting: Internal Medicine

## 2024-09-22 DIAGNOSIS — K529 Noninfective gastroenteritis and colitis, unspecified: Secondary | ICD-10-CM | POA: Diagnosis not present

## 2024-09-22 LAB — MAGNESIUM: Magnesium: 1.6 mg/dL — ABNORMAL LOW (ref 1.7–2.4)

## 2024-09-22 LAB — COMPREHENSIVE METABOLIC PANEL WITH GFR
ALT: 12 U/L (ref 0–44)
AST: 23 U/L (ref 15–41)
Albumin: 3.2 g/dL — ABNORMAL LOW (ref 3.5–5.0)
Alkaline Phosphatase: 67 U/L (ref 38–126)
Anion gap: 10 (ref 5–15)
BUN: 20 mg/dL (ref 8–23)
CO2: 24 mmol/L (ref 22–32)
Calcium: 8.5 mg/dL — ABNORMAL LOW (ref 8.9–10.3)
Chloride: 99 mmol/L (ref 98–111)
Creatinine, Ser: 0.63 mg/dL (ref 0.61–1.24)
GFR, Estimated: >60 mL/min (ref >60)
Glucose, Bld: 89 mg/dL (ref 70–99)
Potassium: 3.5 mmol/L (ref 3.5–5.1)
Sodium: 133 mmol/L — ABNORMAL LOW (ref 135–145)
Total Bilirubin: 2.5 mg/dL — ABNORMAL HIGH (ref 0.0–1.2)
Total Protein: 6.5 g/dL (ref 6.5–8.1)

## 2024-09-22 LAB — PROTIME-INR
INR: 1.4 — ABNORMAL HIGH (ref 0.8–1.2)
Prothrombin Time: 18.3 s — ABNORMAL HIGH (ref 11.4–15.2)

## 2024-09-22 LAB — PHOSPHORUS: Phosphorus: 2.6 mg/dL (ref 2.5–4.6)

## 2024-09-22 LAB — TROPONIN I (HIGH SENSITIVITY): Troponin I (High Sensitivity): 6 ng/L (ref <18)

## 2024-09-22 LAB — CBC
HCT: 34.3 % — ABNORMAL LOW (ref 39.0–52.0)
Hemoglobin: 11.4 g/dL — ABNORMAL LOW (ref 13.0–17.0)
MCH: 25.8 pg — ABNORMAL LOW (ref 26.0–34.0)
MCHC: 33.2 g/dL (ref 30.0–36.0)
MCV: 77.6 fL — ABNORMAL LOW (ref 80.0–100.0)
Platelets: 42 K/uL — ABNORMAL LOW (ref 150–400)
RBC: 4.42 MIL/uL (ref 4.22–5.81)
RDW: 14 % (ref 11.5–15.5)
WBC: 7.6 K/uL (ref 4.0–10.5)
nRBC: 0 % (ref 0.0–0.2)

## 2024-09-22 LAB — LACTIC ACID, PLASMA: Lactic Acid, Venous: 1 mmol/L (ref 0.5–1.9)

## 2024-09-22 LAB — APTT: aPTT: 37 s — ABNORMAL HIGH (ref 24–36)

## 2024-09-22 MED ORDER — MORPHINE SULFATE (PF) 2 MG/ML IV SOLN: 2.0000 mg | INTRAVENOUS | Status: DC | PRN

## 2024-09-22 MED ORDER — FLECAINIDE ACETATE 50 MG PO TABS
50.0000 mg | ORAL_TABLET | Freq: Two times a day (BID) | ORAL | Status: DC
  Administered 2024-09-22 – 2024-09-27 (×11): 50 mg via ORAL
  Filled 2024-09-22 (×11): qty 1

## 2024-09-22 MED ORDER — GABAPENTIN 100 MG PO CAPS
100.0000 mg | ORAL_CAPSULE | Freq: Three times a day (TID) | ORAL | Status: DC
  Administered 2024-09-22 (×2): 100 mg via ORAL
  Filled 2024-09-22 (×2): qty 1

## 2024-09-22 MED ORDER — LACTULOSE 10 GM/15ML PO SOLN
20.0000 g | Freq: Two times a day (BID) | ORAL | Status: DC
  Administered 2024-09-22: 20 g via ORAL
  Filled 2024-09-22 (×3): qty 30

## 2024-09-22 MED ORDER — GABAPENTIN 100 MG PO CAPS
100.0000 mg | ORAL_CAPSULE | Freq: Every day | ORAL | Status: DC
  Administered 2024-09-23 – 2024-09-27 (×5): 100 mg via ORAL
  Filled 2024-09-22 (×5): qty 1

## 2024-09-22 MED ORDER — DICLOFENAC SODIUM 1 % EX GEL
2.0000 g | Freq: Three times a day (TID) | CUTANEOUS | Status: DC | PRN
  Administered 2024-09-22 – 2024-09-27 (×4): 2 g via TOPICAL
  Filled 2024-09-22: qty 100

## 2024-09-22 MED ORDER — DILTIAZEM HCL ER COATED BEADS 120 MG PO CP24
120.0000 mg | ORAL_CAPSULE | Freq: Every day | ORAL | Status: DC
  Administered 2024-09-22 – 2024-09-27 (×6): 120 mg via ORAL
  Filled 2024-09-22 (×6): qty 1

## 2024-09-22 MED ORDER — LOSARTAN POTASSIUM 25 MG PO TABS
25.0000 mg | ORAL_TABLET | Freq: Two times a day (BID) | ORAL | Status: DC
  Administered 2024-09-22 – 2024-09-27 (×8): 25 mg via ORAL
  Filled 2024-09-22 (×11): qty 1

## 2024-09-22 MED ORDER — HEPARIN (PORCINE) 25000 UT/250ML-% IV SOLN
1600.0000 [IU]/h | INTRAVENOUS | Status: DC
  Administered 2024-09-22: 1100 [IU]/h via INTRAVENOUS
  Administered 2024-09-23: 1350 [IU]/h via INTRAVENOUS
  Administered 2024-09-24 (×2): 1550 [IU]/h via INTRAVENOUS
  Filled 2024-09-22 (×4): qty 250

## 2024-09-22 MED ORDER — SACCHAROMYCES BOULARDII 250 MG PO CAPS
250.0000 mg | ORAL_CAPSULE | Freq: Two times a day (BID) | ORAL | Status: DC
  Administered 2024-09-22 – 2024-09-27 (×11): 250 mg via ORAL
  Filled 2024-09-22 (×11): qty 1

## 2024-09-22 MED ORDER — ACETAMINOPHEN 325 MG PO TABS: 650.0000 mg | ORAL_TABLET | Freq: Four times a day (QID) | ORAL | Status: DC | PRN

## 2024-09-22 MED ORDER — ONDANSETRON HCL 4 MG PO TABS: 4.0000 mg | ORAL_TABLET | Freq: Four times a day (QID) | ORAL | Status: DC | PRN

## 2024-09-22 MED ORDER — GABAPENTIN 100 MG PO CAPS
100.0000 mg | ORAL_CAPSULE | Freq: Every day | ORAL | Status: DC
  Administered 2024-09-23 – 2024-09-26 (×4): 100 mg via ORAL
  Filled 2024-09-22 (×4): qty 1

## 2024-09-22 MED ORDER — ACETAMINOPHEN 650 MG RE SUPP: 650.0000 mg | Freq: Four times a day (QID) | RECTAL | Status: DC | PRN

## 2024-09-22 MED ORDER — PIPERACILLIN-TAZOBACTAM 3.375 G IVPB
3.3750 g | Freq: Three times a day (TID) | INTRAVENOUS | Status: DC
  Administered 2024-09-22 – 2024-09-27 (×15): 3.375 g via INTRAVENOUS
  Filled 2024-09-22 (×15): qty 50

## 2024-09-22 MED ORDER — GABAPENTIN 300 MG PO CAPS
300.0000 mg | ORAL_CAPSULE | Freq: Every day | ORAL | Status: DC
  Administered 2024-09-22 – 2024-09-26 (×5): 300 mg via ORAL
  Filled 2024-09-22 (×5): qty 1

## 2024-09-22 MED ORDER — MAGNESIUM SULFATE 2 GM/50ML IV SOLN
2.0000 g | Freq: Once | INTRAVENOUS | Status: AC
  Administered 2024-09-22: 2 g via INTRAVENOUS
  Filled 2024-09-22: qty 50

## 2024-09-22 MED ORDER — ONDANSETRON HCL 4 MG/2ML IJ SOLN
4.0000 mg | Freq: Four times a day (QID) | INTRAMUSCULAR | Status: DC | PRN
Start: 2024-09-22 — End: 2024-09-27

## 2024-09-22 MED ORDER — PANTOPRAZOLE SODIUM 40 MG PO TBEC
80.0000 mg | DELAYED_RELEASE_TABLET | Freq: Every day | ORAL | Status: DC
  Administered 2024-09-22 – 2024-09-27 (×6): 80 mg via ORAL
  Filled 2024-09-22 (×6): qty 2

## 2024-09-22 NOTE — Progress Notes (Signed)
 PT Cancellation Note  Patient Details Name: Ian Sanders MRN: [id number] DOB: 06/17/1941   Cancelled Treatment:    Reason Eval/Treat Not Completed: Medical issues which prohibited therapy (Per chart, new portal vein and splenic vein thrombus seen, AC not commenced. Per MD, will defer PT eval at this time.)  12:14 PM, 09/22/24 Peggye JAYSON Linear, PT, DPT Physical Therapist - [facility]  [phone] (ASCOM)    Azana Kiesler C 09/22/2024, 12:13 PM

## 2024-09-22 NOTE — Discharge Instructions (Addendum)
 Food Resources  Agency Name: [facility] Agency Address: [address], [place], [place] [address] Phone: [phone] Website: [email or web address] Service(s) Offered: Housing services, self-sufficiency, congregate meal program, weatherization program, event organiser program, emergency food assistance,  housing counseling, home ownership program, wheels - to work program.  [employer] free for 60 and older at various locations from usaa, Monday-Friday:  [employer], [address] [place], [phone] -[facility], [address] [place] [phone]  -[facility], [address], [place] [phone]  -[address], [address], [place], [phone]  Agency Name: [facility] Address: [phone] [address], [address], [place], [place] [address] Phone: [phone] Website: [email or web address] Service(s) Offered: Home delivered hot, frozen, and emergency  meals. Grocery assistance program which matches  volunteers one-on-one with seniors unable to grocery shop  for themselves. Must be 60 years and older; less than 20  hours of in-home aide service, limited or no driving ability;  live alone or with someone with a disability; live in  [place].  Agency Name: [employer] [facility] [employer]) Address: [address], [place], [place] [address] Phone: [phone] Service(s) Offered: Food is served to shut-ins, homeless, elderly, and low income people in the community every Saturday (11:30 am-12:30 pm) and Sunday (12:30 pm-1:30pm). Volunteers also offer help and encouragement in seeking employment,  and spiritual guidance.  Agency Name: [employer] Address: [address] [place], [place] [address] Phone: [phone] Service(s) Offered: Child support services; child welfare services; food stamps; Medicaid; work first family assistance; and aid  with fuel,  rent, food and medicine.  Agency Name: [employer] Address: [address] [place], [place] Phone: [phone] Website: [email or web address] Services Offered: Monday 10:00am-12:00, 8:00pm-9:00pm, and Friday 10:00am-12:00.  Agency Name: [employer] [facility] [employer] Address: [address] [place], [place] [address] Phone: [phone] Website: [email or web address] Service(s) Offered: Serves weekday meals, open from 11:30 am- 1:00 pm., and 6:30-7:30pm, Monday-Wednesday-Friday distributes food 3:30-6pm, Monday-Wednesday-Friday.  Agency Name: [facility] Address: [address], [place], [place] Phone: [phone] Website: [email or web address] Services Offered: Distributes food the 4th Saturday of the month, starting at 8:00 am  Agency Name: [facility] Address: [phone] [address] [place], [place] [address] Phone: [phone] Website: [email or web address] Service(s) Offered: [employer] life, weekly food pantry. Open Wednesdays from 10:00am-noon.  Agency Name: [facility] [employer] Address: [address] [place], [place], [place] Phone: [phone] Services Offered: Distributes food 9am-1pm, Monday-Thursday. Call for details.  Agency Name: [facility] Address: [address] [place], [place] [address] Phone: [phone] Website: [email or web address] Service(s) Offered: Games Developer. Call for assistance.  Agency Name: [facility] Address: [address] [place], [place] [address] Phone: [phone] Service Offered: Emergency Food Pantry. Call for appointment.  Agency Name: [facility] Address: [address], [place], [place] [address] Phone: [phone] Website: [email or web address] Services Offered: Games Developer. Call for details  Agency Name: [facility] Address: [address] [place], [place] Phone:  [phone] Website: [email or web address] Service(s) Offered: Emergency Food Pantry. Call for details.  Agency Name: [employer] Address: [address] [place], [place] [address] Phone: [phone] or [phone] Website: [email or web address] Service(s) Offered: Distribute food 9am-11:30 am, Tuesday-Friday, and 1-3:30pm, Monday-Friday. Food pantry Monday-Friday 1pm-3pm, fresh items, Mon.-Wed.-Fri.  Agency Name: [facility] Address: [address] [place], [place] [address] Phone: [phone] Website: [email or web address] Services Offered: Distribute food Tues and Sats from 9:00am-noon.  Closed 1st Saturday of each month. Call for details  Agency Name: [employer] Address: Fayrene Boatman [facility] [address], [place] [address] Phone: [phone]  Services Offered: Delivers meals every Thursday   [facility] [address], [place]  [phone] Open 24 hours  They will call you to set up when they can come for assessment

## 2024-09-22 NOTE — Consult Note (Signed)
 [facility] CONSULT NOTE  Patient Care Team: [name], MD as PCP - General (Family Medicine) Helon Clotilda DELENA DEVONNA as Physician Assistant (Urology) Ammon Blunt, MD as Consulting Physician (Cardiology) Rennie Cindy SAUNDERS, MD as Consulting Physician (Hematology and Oncology) Maurine Lukes, PA-C as Physician Assistant (Urology) Jaye Fallow, MD as Referring Physician (Ophthalmology) Dermatology, [place]  CHIEF COMPLAINTS/PURPOSE OF CONSULTATION: Portal vein thrombosis splenic vein thrombosis  Oncology History Overview Note  # PANCYTOPENIA [platelets-50s ]; ANC- 1.2; Hb-11 -likely secondary cirrhosis/splenomegaly; November 2021 bone marrow biopsy-no evidence of malignancy.-   # Prostate cancer [s/p Prostatectomy 1995]; elevation PSA-Lupron  [Sharon McGovern]  # Cirrhosis [2018; MRI; urology]; A.fib on asprin ;[discontinued Xarelto - sec SDH [Sep 2021; s/p fall]; Dr. Vito; colonoscopy 2019; EGD- SEP 2020-portal gastropathy- Dr. [name]   Prostate cancer [facility]) (Resolved)  06/20/2015 Initial Diagnosis   Prostate cancer (HCC)   Prostate cancer (HCC)  09/12/2020 Initial Diagnosis   Prostate cancer (HCC)     HISTORY OF PRESENTING ILLNESS: Patient resting in the bed.    Ian Sanders 83 y.o.  male pleasant patient with a multiple medical problems including history of stroke/TIA paroxysmal A-fib-not on anticoagulation; cirrhosis thrombocytopenia with hypersplenism-ataxia/history of falls; history of traumatic subdural hematoma 2021-is currently admitted to hospital for abdominal pain nausea and diarrhea.  On further evaluation emergency room CT scan showed right-sided colitis involving the colon and the cecum.  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  Patient is currently on IV antibiotics.  Noted to have improvement of the pain but not resolved.  Denies any blood in stools or black or stools.   Review of  Systems  Constitutional:  Positive for malaise/fatigue and weight loss. Negative for chills, diaphoresis and fever.  HENT:  Negative for nosebleeds and sore throat.   Eyes:  Negative for double vision.  Respiratory:  Negative for cough, hemoptysis, sputum production, shortness of breath and wheezing.   Cardiovascular:  Negative for chest pain, palpitations, orthopnea and leg swelling.  Gastrointestinal:  Positive for abdominal pain, blood in stool and diarrhea. Negative for constipation, heartburn, melena, nausea and vomiting.  Genitourinary:  Negative for dysuria, frequency and urgency.  Musculoskeletal:  Negative for back pain and joint pain.  Skin: Negative.  Negative for itching and rash.  Neurological:  Negative for dizziness, tingling, focal weakness, weakness and headaches.  Endo/Heme/Allergies:  Does not bruise/bleed easily.  Psychiatric/Behavioral:  Negative for depression. The patient is not nervous/anxious and does not have insomnia.     MEDICAL HISTORY:  Past Medical History:  Diagnosis Date   Atrial fibrillation (HCC)    Basal cell carcinoma    Benign paroxysmal positional vertigo    Cancer (HCC)    Cellulitis of left foot 09/18/2023   Closed fracture of distal end of left fibula with routine healing 09/15/2023   Heart palpitations 02/13/2020   History of chicken pox    History of measles    History of mumps    Hypertension    Mononeuritis of unspecified site    Other seborrheic keratosis    Stroke (HCC) 06/2020   Transient ischemic attack (TIA), and cerebral infarction without residual deficits(V12.54)    Traumatic subdural hematoma without loss of consciousness (HCC) 06/25/2020   Unspecified hypothyroidism     SURGICAL HISTORY: Past Surgical History:  Procedure Laterality Date   Carotid Doppler Ultrasound  04/02/2010   Small amount Calcified plaque bilaterally, no significant stenosis.   CATARACT EXTRACTION W/PHACO Right 04/15/2021  Procedure: CATARACT  EXTRACTION PHACO AND INTRAOCULAR LENS PLACEMENT (IOC) RIGHT;  Surgeon: Jaye Fallow, MD;  Location: [facility];  Service: Ophthalmology;  Laterality: Right;  7.98 0:47.2   CATARACT EXTRACTION W/PHACO Left 05/20/2021   Procedure: CATARACT EXTRACTION PHACO AND INTRAOCULAR LENS PLACEMENT (IOC) LEFT 3.37 00:32.0;  Surgeon: Jaye Fallow, MD;  Location: [facility];  Service: Ophthalmology;  Laterality: Left;  Requests arrival 10A or after   COLONOSCOPY  05/11/2014   Tubular Adenoma. Dr. Jinny   COLONOSCOPY WITH PROPOFOL  N/A 05/23/2018   Procedure: COLONOSCOPY WITH PROPOFOL ;  Surgeon: [name] Bi, MD;  Location: [facility] END[facility];  Service: Gastroenterology;  Laterality: N/A;   DOPPLER ECHOCARDIOGRAPHY  04/02/2010   Mild left atrial dilation. Normal right atrium. No valvular disease. No thrombus. Normal LV function. EF>55%   ESOPHAGOGASTRODUODENOSCOPY (EGD) WITH PROPOFOL  N/A 08/16/2019   Procedure: ESOPHAGOGASTRODUODENOSCOPY (EGD) WITH PROPOFOL ;  Surgeon: [name] Bi, MD;  Location: [facility] ENDOSCOPY;  Service: Gastroenterology;  Laterality: N/A;   FINGER AMPUTATION     HERNIA REPAIR Right    MRI Brain with and without contrast  03/20/2010   Suggestive of basal ganglia lacunar infarct   PROSTATECTOMY  11/16/1993   Abdominal   SKIN SURGERY     multiple   Thumb surgery      SOCIAL HISTORY: Social History   Socioeconomic History   Marital status: Married    Spouse name: Not on file   Number of children: 2   Years of education: Not on file   Highest education level: Associate degree: occupational, scientist, product/process development, or vocational program  Occupational History   Occupation: Retired  Tobacco Use   Smoking status: Former    Current packs/day: 0.00    Types: Cigarettes   Smokeless tobacco: Current    Types: Chew   Tobacco comments:    quit over 45 years ago  Vaping Use   Vaping status: Never Used  Substance and Sexual Activity   Alcohol  use: Not Currently    Alcohol /week:  5.0 standard drinks of alcohol     Types: 5 Cans of beer per week    Comment: quit drinking summer 2021   Drug use: No   Sexual activity: Not on file  Other Topics Concern   Not on file  Social History Narrative   Lives with wife at home ; alcohol  abuse; quit smoking 2016; 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   Social Drivers of Corporate Investment Banker Strain: Low Risk  (01/18/2024)   Overall Financial Resource Strain (CARDIA)    Difficulty of Paying Living Expenses: Not hard at all  Food Insecurity: Food Insecurity Present (09/22/2024)   Hunger Vital Sign    Worried About Running Out of Food in the Last Year: Sometimes true    Ran Out of Food in the Last Year: Sometimes true  Transportation Needs: No Transportation Needs (09/22/2024)   PRAPARE - Administrator, Civil Service (Medical): No    Lack of Transportation (Non-Medical): No  Physical Activity: Insufficiently Active (01/18/2024)   Exercise Vital Sign    Days of Exercise per Week: 7 days    Minutes of Exercise per Session: 20 min  Stress: No Stress Concern Present (01/18/2024)   [employer] of Occupational Health - Occupational Stress Questionnaire    Feeling of Stress : Not at all  Social Connections: Moderately Isolated (09/22/2024)   Social Connection and Isolation Panel    Frequency of Communication with Friends and Family: More than three times a week  Frequency of Social Gatherings with Friends and Family: Never    Attends Religious Services: Never    Database Administrator or Organizations: No    Attends Banker Meetings: Never    Marital Status: Married  Catering Manager Violence: Not At Risk (09/22/2024)   Humiliation, Afraid, Rape, and Kick questionnaire    Fear of Current or Ex-Partner: No    Emotionally Abused: No    Physically Abused: No    Sexually Abused: No    FAMILY HISTORY: Family History  Problem Relation Age of Onset   Heart disease Mother 57       CABG    Hypertension Mother    Heart attack Mother    Hypertension Brother    Heart disease Sister        stents   Prostate cancer Brother    Kidney cancer Brother    Skin cancer Brother    Hypertension Brother    Bladder Cancer Sister    Leukemia Father    Stroke Father        multiple   Cerebral aneurysm Sister    Kidney cancer Brother     ALLERGIES:  is allergic to lisinopril, myrbetriq  [mirabegron ], beta adrenergic blockers, other, triamterene, amlodipine, atorvastatin, dabigatran etexilate mesylate, dabigatran etexilate mesylate, and latex.  MEDICATIONS:  Current Facility-Administered Medications  Medication Dose Route Frequency Provider Last Rate Last Admin   acetaminophen  (TYLENOL ) tablet 650 mg  650 mg Oral Q6H PRN Adefeso, Oladapo, DO       Or   acetaminophen  (TYLENOL ) suppository 650 mg  650 mg Rectal Q6H PRN Adefeso, Oladapo, DO       diclofenac Sodium (VOLTAREN) 1 % topical gel 2 g  2 g Topical TID PRN Von Bellis, MD   2 g at 09/22/24 1916   diltiazem  (CARDIZEM  CD) 24 hr capsule 120 mg  120 mg Oral Daily Von Bellis, MD   120 mg at 09/22/24 1015   flecainide  (TAMBOCOR ) tablet 50 mg  50 mg Oral BID Von Bellis, MD   50 mg at 09/22/24 1015   [START ON 09/23/2024] gabapentin  (NEURONTIN ) capsule 100 mg  100 mg Oral Q1200 Von Bellis, MD       And   [START ON 09/23/2024] gabapentin  (NEURONTIN ) capsule 100 mg  100 mg Oral QPC lunch Von Bellis, MD       And   gabapentin  (NEURONTIN ) capsule 300 mg  300 mg Oral QHS Von Bellis, MD       heparin  ADULT infusion 100 units/mL (25000 units/250mL)  1,100 Units/hr Intravenous Continuous Niels Kayla FALCON, RPH 11 mL/hr at 09/22/24 1428 1,100 Units/hr at 09/22/24 1428   lactulose  (CHRONULAC ) 10 GM/15ML solution 20 g  20 g Oral BID Von Bellis, MD   20 g at 09/22/24 1327   losartan  (COZAAR ) tablet 25 mg  25 mg Oral BID Von Bellis, MD       morphine  (PF) 2 MG/ML injection 2 mg  2 mg Intravenous Q4H PRN Adefeso, Oladapo, DO        ondansetron  (ZOFRAN ) tablet 4 mg  4 mg Oral Q6H PRN Adefeso, Oladapo, DO       Or   ondansetron  (ZOFRAN ) injection 4 mg  4 mg Intravenous Q6H PRN Adefeso, Oladapo, DO       pantoprazole  (PROTONIX ) EC tablet 80 mg  80 mg Oral Daily Adefeso, Oladapo, DO   80 mg at 09/22/24 1015   piperacillin-tazobactam (ZOSYN) IVPB 3.375 g  3.375 g Intravenous Q8H Kumar,  Dileep, MD 12.5 mL/hr at 09/22/24 1718 3.375 g at 09/22/24 1718   saccharomyces boulardii (FLORASTOR) capsule 250 mg  250 mg Oral BID Von Bellis, MD   250 mg at 09/22/24 1327    PHYSICAL EXAMINATION:   Vitals:   09/22/24 1725 09/22/24 1926  BP: (!) 143/62 (!) 136/51  Pulse: 82 81  Resp: 16 18  Temp: 99.1 F (37.3 C) 98.1 F (36.7 C)  SpO2: 96% 96%   Filed Weights   09/21/24 1707  Weight: 132 lb 11.5 oz (60.2 kg)   Abdominal tenderness noted.  No rigidity.  Physical Exam Vitals and nursing note reviewed.  HENT:     Head: Normocephalic and atraumatic.     Mouth/Throat:     Pharynx: Oropharynx is clear.  Eyes:     Extraocular Movements: Extraocular movements intact.     Pupils: Pupils are equal, round, and reactive to light.  Cardiovascular:     Rate and Rhythm: Normal rate and regular rhythm.  Pulmonary:     Comments: Decreased breath sounds bilaterally.  Abdominal:     Palpations: Abdomen is soft.  Musculoskeletal:        General: Normal range of motion.     Cervical back: Normal range of motion.  Skin:    General: Skin is warm.  Neurological:     General: No focal deficit present.     Mental Status: He is alert and oriented to person, place, and time.  Psychiatric:        Behavior: Behavior normal.        Judgment: Judgment normal.     LABORATORY DATA:  I have reviewed the data as listed Lab Results  Component Value Date   WBC 7.6 09/22/2024   HGB 11.4 (L) 09/22/2024   HCT 34.3 (L) 09/22/2024   MCV 77.6 (L) 09/22/2024   PLT 42 (L) 09/22/2024   Recent Labs    04/30/24 0305 08/27/24 1230  09/21/24 1708 09/22/24 0423  NA 137 134* 132* 133*  K 4.0 3.9 4.0 3.5  CL 102 100 100 99  CO2 27 22 23 24   GLUCOSE 123* 157* 116* 89  BUN 19 12 21 20   CREATININE 0.72 0.81 0.63 0.63  CALCIUM  9.2 8.9 8.9 8.5*  GFRNONAA >60 >60 >60 >60  PROT 7.2  --  7.2 6.5  ALBUMIN 3.9  --  3.8 3.2*  AST 27  --  27 23  ALT 14  --  14 12  ALKPHOS 79  --  77 67  BILITOT 1.1  --  2.8* 2.5*    RADIOGRAPHIC STUDIES: I have personally reviewed the radiological images as listed and agreed with the findings in the report. CT ABDOMEN PELVIS W CONTRAST Result Date: 09/21/2024 CLINICAL DATA:  Left lower quadrant pain. EXAM: CT ABDOMEN AND PELVIS WITH CONTRAST TECHNIQUE: Multidetector CT imaging of the abdomen and pelvis was performed using the standard protocol following bolus administration of intravenous contrast. RADIATION DOSE REDUCTION: This exam was performed according to the departmental dose-optimization program which includes automated exposure control, adjustment of the mA and/or kV according to patient size and/or use of iterative reconstruction technique. CONTRAST:  OMNIPAQUE  IOHEXOL  300 MG/ML  SOLN COMPARISON:  February 26, 2021 FINDINGS: Lower chest: Moderate severity scarring and/or atelectasis is seen within the bilateral lower lobes. Hepatobiliary: No focal liver abnormality is seen. Numerous subcentimeter gallstones are seen within the lumen of a moderately distended gallbladder. There is no evidence of gallbladder wall thickening, or biliary dilatation. Pancreas:  Unremarkable. No pancreatic ductal dilatation or surrounding inflammatory changes. Spleen: Small, stable, ill-defined areas of parenchymal low attenuation are seen scattered throughout a markedly enlarged spleen (the largest measures approximately 11 mm). Adrenals/Urinary Tract: Adrenal glands are unremarkable. Kidneys are normal in size, without renal calculi or hydronephrosis. Multiple bilateral simple renal cysts are seen. Bladder is  unremarkable. Stomach/Bowel: Stomach is within normal limits. Appendix appears normal. No evidence of bowel dilatation. The walls of the cecum and ascending colon are thickened and edematous in appearance. A moderate amount of inflammatory fat stranding and a small amount of fluid are seen along the adjacent portions of the right paracolic gutter and lateral aspect of the mid to lower right abdomen. Vascular/Lymphatic: Aortic atherosclerosis. A large amount of proximal portal vein thrombus is seen. This extends into the distal aspect of the splenic vein. (Axial CT images 32 through 35, CT series 2). No enlarged abdominal or pelvic lymph nodes. Reproductive: The prostate gland is surgically absent. Other: There is a 2.1 cm x 3.3 cm fluid-filled left inguinal hernia. A mild amount of perihepatic and perisplenic fluid is seen. A small amount of pelvic free fluid is present on the right. Musculoskeletal: A chronic compression fracture deformity is seen at the level of L1, with multilevel degenerative changes present throughout the lumbar spine. IMPRESSION: 1. 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 2. Cholelithiasis. 3. Findings consistent with colitis involving the cecum and ascending colon. 4. Small amount of pelvic free fluid. 5. Multiple bilateral simple renal cysts. 6. Chronic compression fracture deformity at the level of L1. 7. Aortic atherosclerosis. Electronically Signed   By: Suzen Dials M.D.   On: 09/21/2024 22:11     No problem-specific Assessment & Plan notes found for this encounter.   # 83 year old male patient with multiple medical problems including cirrhosis splenomegaly hypersplenism thrombocytopenia; history of falls and also history of previous traumatic subdural hematoma scheduled with the hospital for abdominal pain-noted to have colitis/portal venous-splenic vein thrombosis  # Portal venous thrombosis-splenic vein thrombosis: In the context of cirrhosis  splenomegaly/and recent colitis.   # Colitis-right sided.  # Cirrhosis/splenomegaly hypersplenism thrombocytopenia platelets around 40,000  # History of A-fib-not on anticoagulation/history of traumatic subdural hematomas [2021]  # Recommendation/plan:  # Patient has symptomatic portal vein thrombosis-splenic vein thrombosis/context of colitis.  Discussed the patient-the pros and cons of anticoagulation-including risk of bleeding given her history of subdural hematoma-traumatic [2021]; and ongoing thrombocytopenia/history of falls.  However given the symptomatic/bulky portal venous-splenic vein thrombosis-recommend proceeding with anticoagulation.  Recommend heparin  without bolus; and also transition over to Eliquis low-dose 2.5 mg twice daily at discharge.  # Discussed with vascular-unfortunately no noninvasive options available; also discussed with patient neurologist Dr. Maree.   Thank you Dr. Von for allowing me to participate in the care of your pleasant patient. Please do not hesitate to contact me with questions or concerns in the interim.  Discussed with Dr. Von.   Above plan of care was discussed with patient/family in detail.  My contact information was given to the patient/family.     Cindy JONELLE Joe, MD 09/22/2024 9:39 PM

## 2024-09-22 NOTE — Plan of Care (Signed)

## 2024-09-22 NOTE — Progress Notes (Addendum)
 [facility] Progress Note  Patient: Ian Sanders    FMW:[id number]  DOA: 09/21/2024     Date of Service: the patient was seen and examined on 09/22/2024  Chief Complaint  Patient presents with   Abdominal Pain   Fall   Brief hospital course:  Ian Sanders is a 83 y.o. male with PMH of HTN, TIA, stroke, paroxysmal A.Fib not on anticoagulation, cirrhosis, chronic thrombocytopenia who presents to the emergency department due to 1 day onset of abdominal pain in the upper abdomen.  Pain was described as sharp, intermittent and 10/10 on pain scale.  This was associated with nausea without vomiting and generalized weakness with difficulty being able to ambulate with a walker.   ED course Hemodynamically stable.  CBD: WBC 16.4, hematocrit 30.9, MCV 77.8, platelets 71.  BMP: sodium 132 rest wnl, blood glucose of 116.  Urinalysis was unimpressive for UTI, lactic acid was normal, troponin x 2 was normal.  Influenza A, B, SARS coronavirus 2, RSV was negative. CT a/p: large amount of portal vein and splenic vein thrombus.  Findings consistent with colitis involving the cecum and ascending colon He was treated with Zosyn, and IV hydration fluids.   Assessment and Plan:  # Acute colitis S/p IV hydration S/p IV Zosyn given in the ED, which has been continued.   IV morphine  2 mg q.4h p.r.n. for moderate to severe pain Continue IV Zofran  p.r.n. F/u blood culture x2 Started probiotic    # Portal vein thrombosis CT abdomen and pelvis was suggestive of portal and splenic vein thrombosis Anticoagulation will be held at this time due to thrombocytopenia (chronic) Heme-onc consulted, recommended to start heparin  IV infusion without bolus and then transition to Eliquis 2.5 mg p.o. twice daily if no bleeding. D/w Vascular surgery, if recommended no any other option then anticoagulation per Monitor CBC daily Pharmacy consulted for heparin  dosing and monitoring   Paroxysmal atrial  fibrillation EKG personally reviewed showed normal sinus rhythm Patient not on anticoagulation Continued home meds Cardizem  and flecainide  Advised to follow-up with cardiology as an outpatient   Essential hypertension continue Cardizem  and losartan  Monitor BP and titrate medication according  Chronic neuropathy Continue gabapentin    GERD Continue Protonix    Cirrhosis with chronic thrombocytopenia Platelets 71, no signs of active bleeding Plts 42k Started lactulose   # Hypomagnesemia, mag repleted. Monitor electrolytes daily and replete as needed  Body mass index is 19.6 kg/m.  Interventions:  Diet: Full liquid diet DVT Prophylaxis: Heparin  IV infusion  Advance goals of care discussion: Full code  Family Communication: family was not present at bedside, at the time of interview.  The pt provided permission to discuss medical plan with the family. Opportunity was given to ask question and all questions were answered satisfactorily.   Disposition:  Pt is from home, admitted with multiple falls, colitis and portal vein thrombus, still not stable, which precludes a safe discharge. Discharge to SNF, TBD after PT and OT eval, when stable, may need few days to improve.  Subjective: No significant events overnight.  Complaining of mild lower abdominal pain, no nausea vomiting, denies any other complaints.  Physical Exam: General: NAD, lying comfortably Appear in no distress, affect appropriate Eyes: PERRLA ENT: Oral Mucosa Clear, moist  Neck: no JVD,  Cardiovascular: S1 and S2 Present, no Murmur,  Respiratory: good respiratory effort, Bilateral Air entry equal and Decreased, no Crackles, no wheezes Abdomen: BS Present, Soft and lower abdominal tenderness,  Skin: no rashes Extremities: no Pedal edema,  no calf tenderness Neurologic: without any new focal findings Gait not checked due to patient safety concerns  Vitals:   09/22/24 0010 09/22/24 0056 09/22/24 0431 09/22/24  1006  BP: 131/63 (!) 146/58 (!) 134/59 125/78  Pulse: 76 77 76 80  Resp:  20 20 16   Temp:  98 F (36.7 C) 98.1 F (36.7 C) 97.7 F (36.5 C)  TempSrc:  Oral Oral   SpO2: 96% 97% 93% 97%  Weight:      Height:        Intake/Output Summary (Last 24 hours) at 09/22/2024 1221 Last data filed at 09/22/2024 1016 Gross per 24 hour  Intake 510 ml  Output --  Net 510 ml   Filed Weights   09/21/24 1707  Weight: 60.2 kg    Data Reviewed: I have personally reviewed and interpreted daily labs, tele strips, imagings as discussed above. I reviewed all nursing notes, pharmacy notes, vitals, pertinent old records I have discussed plan of care as described above with RN and patient/family.  CBC: Recent Labs  Lab 09/21/24 1708 09/22/24 0423  WBC 16.4* 7.6  HGB 13.1 11.4*  HCT 38.9* 34.3*  MCV 77.8* 77.6*  PLT 71* 42*   Basic Metabolic Panel: Recent Labs  Lab 09/21/24 1708 09/22/24 0423  NA 132* 133*  K 4.0 3.5  CL 100 99  CO2 23 24  GLUCOSE 116* 89  BUN 21 20  CREATININE 0.63 0.63  CALCIUM  8.9 8.5*  MG  --  1.6*  PHOS  --  2.6    Studies: CT ABDOMEN PELVIS W CONTRAST Result Date: 09/21/2024 CLINICAL DATA:  Left lower quadrant pain. EXAM: CT ABDOMEN AND PELVIS WITH CONTRAST TECHNIQUE: Multidetector CT imaging of the abdomen and pelvis was performed using the standard protocol following bolus administration of intravenous contrast. RADIATION DOSE REDUCTION: This exam was performed according to the departmental dose-optimization program which includes automated exposure control, adjustment of the mA and/or kV according to patient size and/or use of iterative reconstruction technique. CONTRAST:  OMNIPAQUE  IOHEXOL  300 MG/ML  SOLN COMPARISON:  February 26, 2021 FINDINGS: Lower chest: Moderate severity scarring and/or atelectasis is seen within the bilateral lower lobes. Hepatobiliary: No focal liver abnormality is seen. Numerous subcentimeter gallstones are seen within the lumen of  a moderately distended gallbladder. There is no evidence of gallbladder wall thickening, or biliary dilatation. Pancreas: Unremarkable. No pancreatic ductal dilatation or surrounding inflammatory changes. Spleen: Small, stable, ill-defined areas of parenchymal low attenuation are seen scattered throughout a markedly enlarged spleen (the largest measures approximately 11 mm). Adrenals/Urinary Tract: Adrenal glands are unremarkable. Kidneys are normal in size, without renal calculi or hydronephrosis. Multiple bilateral simple renal cysts are seen. Bladder is unremarkable. Stomach/Bowel: Stomach is within normal limits. Appendix appears normal. No evidence of bowel dilatation. The walls of the cecum and ascending colon are thickened and edematous in appearance. A moderate amount of inflammatory fat stranding and a small amount of fluid are seen along the adjacent portions of the right paracolic gutter and lateral aspect of the mid to lower right abdomen. Vascular/Lymphatic: Aortic atherosclerosis. A large amount of proximal portal vein thrombus is seen. This extends into the distal aspect of the splenic vein. (Axial CT images 32 through 35, CT series 2). No enlarged abdominal or pelvic lymph nodes. Reproductive: The prostate gland is surgically absent. Other: There is a 2.1 cm x 3.3 cm fluid-filled left inguinal hernia. A mild amount of perihepatic and perisplenic fluid is seen. A small amount of pelvic  free fluid is present on the right. Musculoskeletal: A chronic compression fracture deformity is seen at the level of L1, with multilevel degenerative changes present throughout the lumbar spine. IMPRESSION: 1. 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 2. Cholelithiasis. 3. Findings consistent with colitis involving the cecum and ascending colon. 4. Small amount of pelvic free fluid. 5. Multiple bilateral simple renal cysts. 6. Chronic compression fracture deformity at the level of L1. 7. Aortic  atherosclerosis. Electronically Signed   By: Suzen Dials M.D.   On: 09/21/2024 22:11    Scheduled Meds:  diltiazem   120 mg Oral Daily   flecainide   50 mg Oral BID   gabapentin   100 mg Oral TID   losartan   25 mg Oral BID   pantoprazole   80 mg Oral Daily   Continuous Infusions:  magnesium  sulfate bolus IVPB     piperacillin-tazobactam (ZOSYN)  IV 3.375 g (09/22/24 1014)   PRN Meds: acetaminophen  **OR** acetaminophen , morphine  injection, ondansetron  **OR** ondansetron  (ZOFRAN ) IV  Time spent: 55 minutes  Author: ELVAN SOR. MD [facility] 09/22/2024 12:21 PM  To reach On-call, see care teams to locate the attending and reach out to them via [email or web address]. If 7PM-7AM, please contact night-coverage If you still have difficulty reaching the attending provider, please page the [facility] (Director on Call) for [facility] on amion for assistance.

## 2024-09-22 NOTE — Consult Note (Signed)
 PHARMACY - ANTICOAGULATION CONSULT NOTE  Pharmacy Consult for heparin  infusion Indication: Portal Vein thrombus  Allergies  Allergen Reactions   Lisinopril Swelling    Tongue swelling   Myrbetriq  [Mirabegron ] Swelling    Of the tongue   Beta Adrenergic Blockers     Other Reaction(s): Other (See Comments)  Symptomatic bradycardia Patient has been tolerating Propranolol  10 mg twice daily since 10/2022, with no complaints   Other Other (See Comments)    Symptomatic bradycardia    Triamterene     Other reaction(s): ITCHING,WATERING EYES  Other Reaction(s): ITCHING,WATERING EYES   Amlodipine Other (See Comments)    Peripheral edema   Atorvastatin Other (See Comments)    Dizziness and Fatigue   Dabigatran Etexilate Mesylate Other (See Comments)    Stomach ulcers (Pradaxa)   Dabigatran Etexilate Mesylate Other (See Comments)    Stomach ulcers (Pradaxa)   Latex Rash    Patient Measurements: Height: 5' 9 (175.3 cm) Weight: 60.2 kg (132 lb 11.5 oz) IBW/kg (Calculated) : 70.7  Vital Signs: Temp: 97.7 F (36.5 C) (11/07 1006) Temp Source: Oral (11/07 0431) BP: 125/78 (11/07 1006) Pulse Rate: 80 (11/07 1006)  Labs: Recent Labs    09/21/24 1708 09/21/24 2102 09/21/24 2340 09/22/24 0423  HGB 13.1  --   --  11.4*  HCT 38.9*  --   --  34.3*  PLT 71*  --   --  42*  CREATININE 0.63  --   --  0.63  TROPONINIHS  --  7 6  --     Estimated Creatinine Clearance: 59.6 mL/min (by C-G formula based on SCr of 0.63 mg/dL).   Medical History: Past Medical History:  Diagnosis Date   Atrial fibrillation (HCC)    Basal cell carcinoma    Benign paroxysmal positional vertigo    Cancer (HCC)    Cellulitis of left foot 09/18/2023   Closed fracture of distal end of left fibula with routine healing 09/15/2023   Heart palpitations 02/13/2020   History of chicken pox    History of measles    History of mumps    Hypertension    Mononeuritis of unspecified site    Other seborrheic  keratosis    Stroke (HCC) 06/2020   Transient ischemic attack (TIA), and cerebral infarction without residual deficits(V12.54)    Traumatic subdural hematoma without loss of consciousness (HCC) 06/25/2020   Unspecified hypothyroidism     Medications:  No home anticoagulation per pharmacist review  Assessment: 83 yo male presented to ED with abdominal pain.  Patient found to have colitis and portal vein thrombus.  Pharmacy consulted to initiate heparin  infusion.  Baseline aPTT and PT/INR ordered.  Goal of Therapy:  Heparin  level 0.3-0.5 units/ml Monitor platelets by anticoagulation protocol: Yes   Plan:  Per provider due to low platelet count/increased bleeding risk use 0.3-0.5 therapeutic range and no bolus Will initiate heparin  infusion at 1100 units/hr Check HL 8 hours after initiation CBC daily while on heparin   Kayla JULIANNA Blew, PharmD, BCPS 09/22/2024,1:31 PM

## 2024-09-22 NOTE — TOC CM/SW Note (Signed)
 Transition of Care [facility]) - Inpatient Brief Assessment   Patient Details  Name: Ian Sanders MRN: [id number] Date of Birth: January 18, 1941  [facility] CM/SW Contact:    [name], RN Phone Number: 09/22/2024, 12:17 PM   Clinical Narrative:  PT OT eval pending new portal vein and splenic vein thrombus - Therapy on hold Per sdoh food resources added to AVS  Transition of Care Asessment: Insurance and Status: Insurance coverage has been reviewed Patient has primary care physician: Yes     Prior/Current Home Services: Current home services Social Drivers of Health Review: SDOH reviewed interventions complete Readmission risk has been reviewed: Yes Transition of care needs: transition of care needs identified, TOC will continue to follow

## 2024-09-23 DIAGNOSIS — K529 Noninfective gastroenteritis and colitis, unspecified: Secondary | ICD-10-CM | POA: Diagnosis not present

## 2024-09-23 LAB — BASIC METABOLIC PANEL WITH GFR
Anion gap: 11 (ref 5–15)
BUN: 20 mg/dL (ref 8–23)
CO2: 24 mmol/L (ref 22–32)
Calcium: 8 mg/dL — ABNORMAL LOW (ref 8.9–10.3)
Chloride: 98 mmol/L (ref 98–111)
Creatinine, Ser: 0.6 mg/dL — ABNORMAL LOW (ref 0.61–1.24)
GFR, Estimated: >60 mL/min (ref >60)
Glucose, Bld: 98 mg/dL (ref 70–99)
Potassium: 3.2 mmol/L — ABNORMAL LOW (ref 3.5–5.1)
Sodium: 133 mmol/L — ABNORMAL LOW (ref 135–145)

## 2024-09-23 LAB — CBC
HCT: 29.5 % — ABNORMAL LOW (ref 39.0–52.0)
HCT: 30.9 % — ABNORMAL LOW (ref 39.0–52.0)
Hemoglobin: 9.9 g/dL — ABNORMAL LOW (ref 13.0–17.0)
Hemoglobin: 10.1 g/dL — ABNORMAL LOW (ref 13.0–17.0)
MCH: 26.1 pg (ref 26.0–34.0)
MCH: 26.2 pg (ref 26.0–34.0)
MCHC: 33.6 g/dL (ref 30.0–36.0)
MCHC: 32.7 g/dL (ref 30.0–36.0)
MCV: 77.8 fL — ABNORMAL LOW (ref 80.0–100.0)
MCV: 80.1 fL (ref 80.0–100.0)
Platelets: 37 K/uL — ABNORMAL LOW (ref 150–400)
Platelets: 45 K/uL — ABNORMAL LOW (ref 150–400)
RBC: 3.79 MIL/uL — ABNORMAL LOW (ref 4.22–5.81)
RBC: 3.86 MIL/uL — ABNORMAL LOW (ref 4.22–5.81)
RDW: 13.9 % (ref 11.5–15.5)
RDW: 13.8 % (ref 11.5–15.5)
WBC: 3.9 K/uL — ABNORMAL LOW (ref 4.0–10.5)
WBC: 4.1 K/uL (ref 4.0–10.5)
nRBC: 0 % (ref 0.0–0.2)
nRBC: 0 % (ref 0.0–0.2)

## 2024-09-23 LAB — HEPATIC FUNCTION PANEL
ALT: 9 U/L (ref 0–44)
AST: 17 U/L (ref 15–41)
Albumin: 3 g/dL — ABNORMAL LOW (ref 3.5–5.0)
Alkaline Phosphatase: 63 U/L (ref 38–126)
Bilirubin, Direct: 0.4 mg/dL — ABNORMAL HIGH (ref 0.0–0.2)
Indirect Bilirubin: 0.9 mg/dL (ref 0.3–0.9)
Total Bilirubin: 1.3 mg/dL — ABNORMAL HIGH (ref 0.0–1.2)
Total Protein: 6.1 g/dL — ABNORMAL LOW (ref 6.5–8.1)

## 2024-09-23 LAB — PHOSPHORUS: Phosphorus: 3 mg/dL (ref 2.5–4.6)

## 2024-09-23 LAB — HEPARIN LEVEL (UNFRACTIONATED)
Heparin Unfractionated: <0.1 [IU]/mL — ABNORMAL LOW (ref 0.30–0.70)
Heparin Unfractionated: <0.1 [IU]/mL — ABNORMAL LOW (ref 0.30–0.70)
Heparin Unfractionated: 0.11 [IU]/mL — ABNORMAL LOW (ref 0.30–0.70)
Heparin Unfractionated: 0.1 [IU]/mL — ABNORMAL LOW (ref 0.30–0.70)

## 2024-09-23 LAB — MAGNESIUM: Magnesium: 2 mg/dL (ref 1.7–2.4)

## 2024-09-23 LAB — AMMONIA: Ammonia: 26 umol/L (ref 9–35)

## 2024-09-23 MED ORDER — LACTULOSE 10 GM/15ML PO SOLN
20.0000 g | Freq: Every day | ORAL | Status: DC
  Administered 2024-09-24: 20 g via ORAL
  Filled 2024-09-23: qty 30

## 2024-09-23 MED ORDER — POTASSIUM CHLORIDE 20 MEQ PO PACK
40.0000 meq | PACK | ORAL | Status: AC
  Administered 2024-09-23 (×2): 40 meq via ORAL
  Filled 2024-09-23 (×2): qty 2

## 2024-09-23 NOTE — Progress Notes (Signed)
 [facility] Progress Note  Patient: Ian Sanders    FMW:[id number]  DOA: 09/21/2024     Date of Service: the patient was seen and examined on 09/23/2024  Chief Complaint  Patient presents with   Abdominal Pain   Fall   Brief hospital course:  Ian Sanders is a 83 y.o. male with PMH of HTN, TIA, stroke, paroxysmal A.Fib not on anticoagulation, cirrhosis, chronic thrombocytopenia who presents to the emergency department due to 1 day onset of abdominal pain in the upper abdomen.  Pain was described as sharp, intermittent and 10/10 on pain scale.  This was associated with nausea without vomiting and generalized weakness with difficulty being able to ambulate with a walker.   ED course Hemodynamically stable.  CBD: WBC 16.4, hematocrit 30.9, MCV 77.8, platelets 71.  BMP: sodium 132 rest wnl, blood glucose of 116.  Urinalysis was unimpressive for UTI, lactic acid was normal, troponin x 2 was normal.  Influenza A, B, SARS coronavirus 2, RSV was negative. CT a/p: large amount of portal vein and splenic vein thrombus.  Findings consistent with colitis involving the cecum and ascending colon He was treated with Zosyn, and IV hydration fluids.   Assessment and Plan:  # Acute colitis S/p IV hydration S/p IV Zosyn given in the ED, which has been continued.   IV morphine  2 mg q.4h p.r.n. for moderate to severe pain Continue IV Zofran  p.r.n. F/u blood culture x2 Started probiotic    # Portal vein thrombosis CT abdomen and pelvis was suggestive of portal and splenic vein thrombosis Anticoagulation will be held at this time due to thrombocytopenia (chronic) Heme-onc consulted, recommended to start heparin  IV infusion without bolus and then transition to Eliquis 2.5 mg p.o. twice daily if no bleeding. D/w Vascular surgery, if recommended no any other option then anticoagulation per Monitor CBC daily Pharmacy consulted for heparin  dosing and monitoring   Paroxysmal atrial  fibrillation EKG personally reviewed showed normal sinus rhythm Patient not on anticoagulation Continued home meds Cardizem  and flecainide  Advised to follow-up with cardiology as an outpatient   Essential hypertension continue Cardizem  and losartan  Monitor BP and titrate medication according  Chronic neuropathy Continue gabapentin  home dose   GERD Continue Protonix    Cirrhosis with chronic thrombocytopenia Platelets 71, no signs of active bleeding Plts 42>37>45 Started lactulose , Titrate to 1-2 BM per day  # Hyponatremia, monitor sodium level daily # Hypokalemia, potassium repleted. # Hypomagnesemia, mag repleted. Monitor electrolytes daily and replete as needed  Body mass index is 19.6 kg/m.  Interventions:  Diet: Full liquid diet DVT Prophylaxis: Heparin  IV infusion  Advance goals of care discussion: Full code  Family Communication: family was not present at bedside, at the time of interview.  The pt provided permission to discuss medical plan with the family. Opportunity was given to ask question and all questions were answered satisfactorily.   Disposition:  Pt is from home, admitted with multiple falls, colitis and portal vein thrombus, still not stable, which precludes a safe discharge. Discharge to SNF, TBD after PT and OT eval, when stable, may need few days to improve.  Subjective: No significant events overnight.  Lower abdominal pain is better now, patient is complaining of having some loose stools.  Patient was advised to skip lactulose .  No any other complaints.   Physical Exam: General: NAD, lying comfortably Appear in no distress, affect appropriate Eyes: PERRLA ENT: Oral Mucosa Clear, moist  Neck: no JVD,  Cardiovascular: S1 and S2 Present, no  Murmur,  Respiratory: good respiratory effort, Bilateral Air entry equal and Decreased, no Crackles, no wheezes Abdomen: BS Present, Soft and lower abdominal tenderness,  Skin: no rashes Extremities: no  Pedal edema, no calf tenderness Neurologic: without any new focal findings Gait not checked due to patient safety concerns  Vitals:   09/22/24 1926 09/23/24 0312 09/23/24 0823 09/23/24 1426  BP:  (!) 104/47 (!) 109/44 (!) 120/51  Pulse:  65 66 64  Resp:  18 16 16   Temp:  97.6 F (36.4 C) 98.4 F (36.9 C) 98.3 F (36.8 C)  TempSrc: Oral  Oral Oral  SpO2:  93% 94% 98%  Weight:      Height:        Intake/Output Summary (Last 24 hours) at 09/23/2024 1546 Last data filed at 09/23/2024 1300 Gross per 24 hour  Intake 722.35 ml  Output --  Net 722.35 ml   Filed Weights   09/21/24 1707  Weight: 60.2 kg    Data Reviewed: I have personally reviewed and interpreted daily labs, tele strips, imagings as discussed above. I reviewed all nursing notes, pharmacy notes, vitals, pertinent old records I have discussed plan of care as described above with RN and patient/family.  CBC: Recent Labs  Lab 09/21/24 1708 09/22/24 0423 09/23/24 0341 09/23/24 1432  WBC 16.4* 7.6 3.9* 4.1  HGB 13.1 11.4* 9.9* 10.1*  HCT 38.9* 34.3* 29.5* 30.9*  MCV 77.8* 77.6* 77.8* 80.1  PLT 71* 42* 37* 45*   Basic Metabolic Panel: Recent Labs  Lab 09/21/24 1708 09/22/24 0423 09/23/24 0341  NA 132* 133* 133*  K 4.0 3.5 3.2*  CL 100 99 98  CO2 23 24 24   GLUCOSE 116* 89 98  BUN 21 20 20   CREATININE 0.63 0.63 0.60*  CALCIUM  8.9 8.5* 8.0*  MG  --  1.6* 2.0  PHOS  --  2.6 3.0    Studies: No results found.   Scheduled Meds:  diltiazem   120 mg Oral Daily   flecainide   50 mg Oral BID   gabapentin   100 mg Oral Q1200   And   gabapentin   100 mg Oral QPC lunch   And   gabapentin   300 mg Oral QHS   lactulose   20 g Oral BID   losartan   25 mg Oral BID   pantoprazole   80 mg Oral Daily   saccharomyces boulardii  250 mg Oral BID   Continuous Infusions:  heparin  1,350 Units/hr (09/23/24 1216)   piperacillin-tazobactam (ZOSYN)  IV 3.375 g (09/23/24 1059)   PRN Meds: acetaminophen  **OR**  acetaminophen , diclofenac Sodium, morphine  injection, ondansetron  **OR** ondansetron  (ZOFRAN ) IV  Time spent: 40 minutes  Author: ELVAN SOR. MD [facility] 09/23/2024 3:47 PM  To reach On-call, see care teams to locate the attending and reach out to them via [email or web address]. If 7PM-7AM, please contact night-coverage If you still have difficulty reaching the attending provider, please page the [facility] (Director on Call) for [facility] on amion for assistance.

## 2024-09-23 NOTE — Evaluation (Signed)
 Physical Therapy Evaluation, Co-eval with OT Patient Details Name: Ian Sanders MRN: [id number] DOB: Sep 18, 1941 Today's Date: 09/23/2024  History of Present Illness  Pt is an 83 y.o. male who presents to the ED due to 1 day onset of abdominal pain in the upper abdomen associated with nausea and generalized weakness with difficulty ambulating and 2 falls. Current MD assessment: acute colitis, portal vein thrombosis. PMH of hypertension, TIA, stroke, GERD, neuropathy, paroxysmal atrial fibrillation not on anticoagulation, cirrhosis requiring thrombocytopenia  Clinical Impression  83 yo Male reports recent fatigue/weakness. He is motivated to get up and move around. He lives at home with his wife who is modified independent in her self care ADLs. Patient reports using RW at baseline. He reports 2-3 recent falls. Patient requires CGA for bed mobility and transfers. He can stand up from regular height bed/toilet with RW with CGA, but does require extra time/effort. He ambulated 75 feet with RW requiring CGA to steady and for safety. He does ambulate with reciprocal gait pattern, decreased step length, normal base of support and good foot clearance. He does ambulate with slower gait speed. 10 feet walk speed: 0.53 feet/sec indicating limited home ambulator and increased risk for falls. He also demonstrates generalized weakness (grossly 3+/5 of BLE) and peripheral neuropathy which likely contributes to falls. He would benefit from skilled PT intervention to improve strength and mobility.         If plan is discharge home, recommend the following: A little help with walking and/or transfers;A little help with bathing/dressing/bathroom;Assistance with cooking/housework;Assist for transportation;Help with stairs or ramp for entrance   Can travel by private vehicle        Equipment Recommendations None recommended by PT  Recommendations for Other Services       Functional Status Assessment Patient has  had a recent decline in their functional status and demonstrates the ability to make significant improvements in function in a reasonable and predictable amount of time.     Precautions / Restrictions Precautions Precautions: Fall Precaution/Restrictions Comments: reports 2-3 recent falls. Restrictions Weight Bearing Restrictions Per Provider Order: No      Mobility  Bed Mobility Overal bed mobility: Needs Assistance Bed Mobility: Supine to Sit     Supine to sit: Contact guard     General bed mobility comments: requires extra time/effort    Transfers Overall transfer level: Needs assistance Equipment used: Rolling walker (2 wheels) Transfers: Sit to/from Stand Sit to Stand: Contact guard assist           General transfer comment: increased time/effort to complete, Transferred x1 rep from bed, x1 rep from regular height commode and x1 rep to recliner    Ambulation/Gait Ambulation/Gait assistance: Contact guard assist Gait Distance (Feet): 75 Feet Assistive device: Rolling walker (2 wheels) Gait Pattern/deviations: Decreased step length - right, Decreased step length - left, Step-through pattern Gait velocity: decreased, 10 feet walk speed: 0.53 feet/sec Gait velocity interpretation: <1.31 ft/sec, indicative of household ambulator   General Gait Details: pt ambulates with RW, slight flexed posture, normal base of support, good foot clearance  Stairs            Wheelchair Mobility     Tilt Bed    Modified Rankin (Stroke Patients Only)       Balance Overall balance assessment: Needs assistance Sitting-balance support: Single extremity supported, No upper extremity supported, Feet supported, Feet unsupported Sitting balance-Leahy Scale: Fair Sitting balance - Comments: PRN posterior lean preventing him from keeping feet on  the ground in sitting Postural control: Posterior lean Standing balance support: Bilateral upper extremity supported, Reliant on  assistive device for balance Standing balance-Leahy Scale: Fair                               Pertinent Vitals/Pain Pain Assessment Pain Assessment: No/denies pain    Home Living Family/patient expects to be discharged to:: Private residence Living Arrangements: Spouse/significant other Available Help at Discharge: Family Type of Home: House Home Access: Stairs to enter;Ramped entrance Entrance Stairs-Rails: Right Entrance Stairs-Number of Steps: 3   Home Layout: One level Home Equipment: Agricultural Consultant (2 wheels);Shower seat;Wheelchair - manual;Lift chair      Prior Function Prior Level of Function : Independent/Modified Independent;History of Falls (last six months);Needs assist             Mobility Comments: pt reports limited household ambulation with RW, hx of falls ADLs Comments: Pt reports indep with basic ADL, seated shower, and assist for IADL except medication mgt. Wife drives. Pt receives Meals on Wheels     Extremity/Trunk Assessment   Upper Extremity Assessment Upper Extremity Assessment: Defer to OT evaluation    Lower Extremity Assessment Lower Extremity Assessment: Generalized weakness;RLE deficits/detail;LLE deficits/detail RLE Deficits / Details: grossly 3+/5 RLE Sensation: history of peripheral neuropathy;decreased light touch LLE Deficits / Details: grossly 3+/5 LLE Sensation: decreased light touch;history of peripheral neuropathy    Cervical / Trunk Assessment Cervical / Trunk Assessment: Kyphotic  Communication   Communication Communication: Impaired Factors Affecting Communication: Hearing impaired    Cognition Arousal: Alert Behavior During Therapy: WFL for tasks assessed/performed                             Following commands: Intact       Cueing Cueing Techniques: Verbal cues     General Comments      Exercises Other Exercises Other Exercises: Educated patient on role of PT and recommendations    Assessment/Plan    PT Assessment Patient needs continued PT services  PT Problem List Decreased strength;Decreased activity tolerance;Decreased balance;Decreased mobility       PT Treatment Interventions Balance training;Gait training;Functional mobility training;Patient/family education;Therapeutic activities;Therapeutic exercise    PT Goals (Current goals can be found in the Care Plan section)  Acute Rehab PT Goals Patient Stated Goal: to get stronger PT Goal Formulation: With patient Time For Goal Achievement: 10/07/24 Potential to Achieve Goals: Good    Frequency Min 2X/week     Co-evaluation PT/OT/SLP Co-Evaluation/Treatment: Yes Reason for Co-Treatment: To address functional/ADL transfers;For patient/therapist safety PT goals addressed during session: Mobility/safety with mobility OT goals addressed during session: ADL's and self-care       AM-PAC PT 6 Clicks Mobility  Outcome Measure Help needed turning from your back to your side while in a flat bed without using bedrails?: None Help needed moving from lying on your back to sitting on the side of a flat bed without using bedrails?: A Little Help needed moving to and from a bed to a chair (including a wheelchair)?: A Little Help needed standing up from a chair using your arms (e.g., wheelchair or bedside chair)?: A Little Help needed to walk in hospital room?: A Little Help needed climbing 3-5 steps with a railing? : A Little 6 Click Score: 19    End of Session Equipment Utilized During Treatment: Gait belt Activity Tolerance: Patient tolerated treatment well  Patient left: in chair;with call bell/phone within reach;with chair alarm set Nurse Communication: Mobility status PT Visit Diagnosis: Unsteadiness on feet (R26.81);Muscle weakness (generalized) (M62.81)    Time: 8984-8954 PT Time Calculation (min) (ACUTE ONLY): 30 min   Charges:   PT Evaluation $PT Eval Low Complexity: 1 Low   PT General  Charges $$ ACUTE PT VISIT: 1 Visit         Aashritha Miedema PT, DPT 09/23/2024, 2:04 PM

## 2024-09-23 NOTE — Consult Note (Signed)
 PHARMACY - ANTICOAGULATION CONSULT NOTE  Pharmacy Consult for heparin  infusion Indication: Portal Vein thrombus  Allergies  Allergen Reactions   Lisinopril Swelling    Tongue swelling   Myrbetriq  [Mirabegron ] Swelling    Of the tongue   Beta Adrenergic Blockers     Other Reaction(s): Other (See Comments)  Symptomatic bradycardia Patient has been tolerating Propranolol  10 mg twice daily since 10/2022, with no complaints   Other Other (See Comments)    Symptomatic bradycardia    Triamterene     Other reaction(s): ITCHING,WATERING EYES  Other Reaction(s): ITCHING,WATERING EYES   Amlodipine Other (See Comments)    Peripheral edema   Atorvastatin Other (See Comments)    Dizziness and Fatigue   Dabigatran Etexilate Mesylate Other (See Comments)    Stomach ulcers (Pradaxa)   Dabigatran Etexilate Mesylate Other (See Comments)    Stomach ulcers (Pradaxa)   Latex Rash    Patient Measurements: Height: 5' 9 (175.3 cm) Weight: 60.2 kg (132 lb 11.5 oz) IBW/kg (Calculated) : 70.7  Vital Signs: Temp: 98.4 F (36.9 C) (11/08 0823) Temp Source: Oral (11/08 0823) BP: 109/44 (11/08 0823) Pulse Rate: 66 (11/08 0823)  Labs: Recent Labs    09/21/24 1708 09/21/24 2102 09/21/24 2340 09/22/24 0423 09/22/24 1400 09/22/24 2226 09/23/24 0341 09/23/24 1001  HGB 13.1  --   --  11.4*  --   --  9.9*  --   HCT 38.9*  --   --  34.3*  --   --  29.5*  --   PLT 71*  --   --  42*  --   --  37*  --   APTT  --   --   --   --  37*  --   --   --   LABPROT  --   --   --   --  18.3*  --   --   --   INR  --   --   --   --  1.4*  --   --   --   HEPARINUNFRC  --   --   --   --   --  <0.10*  --  <0.10*  CREATININE 0.63  --   --  0.63  --   --  0.60*  --   TROPONINIHS  --  7 6  --   --   --   --   --     Estimated Creatinine Clearance: 59.6 mL/min (A) (by C-G formula based on SCr of 0.6 mg/dL (L)).   Medical History: Past Medical History:  Diagnosis Date   Atrial fibrillation (HCC)    Basal  cell carcinoma    Benign paroxysmal positional vertigo    Cancer (HCC)    Cellulitis of left foot 09/18/2023   Closed fracture of distal end of left fibula with routine healing 09/15/2023   Heart palpitations 02/13/2020   History of chicken pox    History of measles    History of mumps    Hypertension    Mononeuritis of unspecified site    Other seborrheic keratosis    Stroke (HCC) 06/2020   Transient ischemic attack (TIA), and cerebral infarction without residual deficits(V12.54)    Traumatic subdural hematoma without loss of consciousness (HCC) 06/25/2020   Unspecified hypothyroidism     Medications:  No home anticoagulation per pharmacist review  Assessment: 83 yo male presented to ED with abdominal pain.  Patient found to have colitis and portal vein thrombus.  Pharmacy consulted to initiate heparin  infusion.  Baseline aPTT and PT/INR ordered.  11/7: HL @ 2226 = < 0.1, SUBtherapeutic  11/8 10001 HL <0.10, SUBtherapeutic  Goal of Therapy:  Heparin  level 0.3-0.5 units/ml Monitor platelets by anticoagulation protocol: Yes   Plan:  Per provider due to low platelet count/increased bleeding risk use 0.3-0.5 therapeutic range and no bolus  11/8 1001  HL = < 0.10, SUBtherapeutic - Will increase drip rate from 1200 to 1350 units/hr and recheck HL 8 hrs after rate change  CBC daily while on heparin   Fayetta Sorenson A, PharmD 09/23/2024,10:56 AM

## 2024-09-23 NOTE — Consult Note (Addendum)
 PHARMACY - ANTICOAGULATION CONSULT NOTE  Pharmacy Consult for heparin  infusion Indication: Portal Vein thrombus  Allergies  Allergen Reactions   Lisinopril Swelling    Tongue swelling   Myrbetriq  [Mirabegron ] Swelling    Of the tongue   Beta Adrenergic Blockers     Other Reaction(s): Other (See Comments)  Symptomatic bradycardia Patient has been tolerating Propranolol  10 mg twice daily since 10/2022, with no complaints   Other Other (See Comments)    Symptomatic bradycardia    Triamterene     Other reaction(s): ITCHING,WATERING EYES  Other Reaction(s): ITCHING,WATERING EYES   Amlodipine Other (See Comments)    Peripheral edema   Atorvastatin Other (See Comments)    Dizziness and Fatigue   Dabigatran Etexilate Mesylate Other (See Comments)    Stomach ulcers (Pradaxa)   Dabigatran Etexilate Mesylate Other (See Comments)    Stomach ulcers (Pradaxa)   Latex Rash    Patient Measurements: Height: 5' 9 (175.3 cm) Weight: 60.2 kg (132 lb 11.5 oz) IBW/kg (Calculated) : 70.7 HEPARIN  DW (KG): 60.2  Vital Signs: Temp: 98.3 F (36.8 C) (11/08 1426) Temp Source: Oral (11/08 1426) BP: 120/51 (11/08 1426) Pulse Rate: 64 (11/08 1426)  Labs: Recent Labs    09/21/24 1708 09/21/24 2102 09/21/24 2340 09/22/24 0423 09/22/24 1400 09/22/24 2226 09/23/24 0341 09/23/24 1001 09/23/24 1432  HGB 13.1  --   --  11.4*  --   --  9.9*  --   --   HCT 38.9*  --   --  34.3*  --   --  29.5*  --   --   PLT 71*  --   --  42*  --   --  37*  --   --   APTT  --   --   --   --  37*  --   --   --   --   LABPROT  --   --   --   --  18.3*  --   --   --   --   INR  --   --   --   --  1.4*  --   --   --   --   HEPARINUNFRC  --   --   --   --   --  <0.10*  --  <0.10* 0.11*  CREATININE 0.63  --   --  0.63  --   --  0.60*  --   --   TROPONINIHS  --  7 6  --   --   --   --   --   --     Estimated Creatinine Clearance: 59.6 mL/min (A) (by C-G formula based on SCr of 0.6 mg/dL (L)).   Medical  History: Past Medical History:  Diagnosis Date   Atrial fibrillation (HCC)    Basal cell carcinoma    Benign paroxysmal positional vertigo    Cancer (HCC)    Cellulitis of left foot 09/18/2023   Closed fracture of distal end of left fibula with routine healing 09/15/2023   Heart palpitations 02/13/2020   History of chicken pox    History of measles    History of mumps    Hypertension    Mononeuritis of unspecified site    Other seborrheic keratosis    Stroke (HCC) 06/2020   Transient ischemic attack (TIA), and cerebral infarction without residual deficits(V12.54)    Traumatic subdural hematoma without loss of consciousness (HCC) 06/25/2020   Unspecified hypothyroidism  Medications:  No home anticoagulation per pharmacist review  Assessment: 83 yo male presented to ED with abdominal pain.  Patient found to have colitis and portal vein thrombus.  Pharmacy consulted to initiate heparin  infusion.  Baseline aPTT and PT/INR ordered.  11/7: HL @ 2226 = < 0.1, SUBtherapeutic  11/8 10001 HL <0.10, SUBtherapeutic 11/8 2008 HL 0.10, SUBtherapeutic  Goal of Therapy:  Heparin  level 0.3-0.5 units/ml Monitor platelets by anticoagulation protocol: Yes   Plan:  Per provider due to low platelet count/increased bleeding risk use 0.3-0.5 therapeutic range and no bolus 11/8 2008 HL 0.10, SUBtherapeutic - Increase heparin  drip rate to 1550 units/hr and recheck HL 8 hrs after rate change  - CBC daily while on heparin   Will M. Lenon, PharmD, BCPS Clinical Pharmacist 09/23/2024 3:23 PM

## 2024-09-23 NOTE — Progress Notes (Signed)
 OT Cancellation Note  Patient Details Name: Ian Sanders MRN: [id number] DOB: 1941/03/27   Cancelled Treatment:    Reason Eval/Treat Not Completed: Patient not medically ready. (Late entry note.) Therapy held per MD via secure chat with Megan Puckett, OT, on 11/7. New portal vein and splenic vein thrombus, anticoagulation not yet initiated as of 11:03am 11/7. Will re-attempt OT evaluation as medically appropriate.   Everlynn Sagun R., MPH, MS, OTR/L ascom [phone] 09/23/24, 7:31 AM

## 2024-09-23 NOTE — Evaluation (Signed)
 Occupational Therapy Evaluation Patient Details Name: Ian Sanders MRN: [id number] DOB: [birth year] Today's Date: 09/23/2024   History of Present Illness   Pt is an 83 y.o. male who presents to the ED due to 1 day onset of abdominal pain in the upper abdomen associated with nausea and generalized weakness with difficulty ambulating and 2 falls. Current MD assessment: acute colitis, portal vein thrombosis. PMH of hypertension, TIA, stroke, GERD, neuropathy, paroxysmal atrial fibrillation not on anticoagulation, cirrhosis requiring thrombocytopenia     Clinical Impressions Pt was seen for OT evaluation and co-tx with PT this date to optimize safety for ADL/mobility. Prior to hospital admission, pt was ambulating limited household distances using RW, reports being indep with basic ADL including seated shower, and receives Meals on Wheels. Pt lives with his spouse who does majority of housekeeping, cooking, and all driving. Pt presents with deficits in strength, activity tolerance, and balance, affecting safe and optimal ADL completion. Pt currently requires CGA for bed mobility, STS transfers, and ambulation with RW. Pt fatigues quickly. Pt endorses feeling weaker than baseline. Pt would benefit from skilled OT services to address noted impairments and functional limitations (see below for any additional details) in order to maximize safety and independence while minimizing future risk of falls, injury, and readmission. Anticipate the need for follow up OT services upon acute hospital DC.    If plan is discharge home, recommend the following:   A little help with walking and/or transfers;A little help with bathing/dressing/bathroom;Assistance with cooking/housework;Assist for transportation;Help with stairs or ramp for entrance     Functional Status Assessment   Patient has had a recent decline in their functional status and demonstrates the ability to make significant improvements in function  in a reasonable and predictable amount of time.     Equipment Recommendations   None recommended by OT     Recommendations for Other Services         Precautions/Restrictions   Precautions Precautions: Fall Restrictions Weight Bearing Restrictions Per Provider Order: No     Mobility Bed Mobility Overal bed mobility: Needs Assistance Bed Mobility: Supine to Sit     Supine to sit: Contact guard          Transfers Overall transfer level: Needs assistance Equipment used: Rolling walker (2 wheels) Transfers: Sit to/from Stand Sit to Stand: Contact guard assist           General transfer comment: increased time/effort to complete      Balance Overall balance assessment: Needs assistance Sitting-balance support: Single extremity supported, No upper extremity supported, Feet supported, Feet unsupported Sitting balance-Leahy Scale: Fair Sitting balance - Comments: PRN posterior lean preventing him from keeping feet on the ground in sitting Postural control: Posterior lean Standing balance support: Bilateral upper extremity supported, Reliant on assistive device for balance Standing balance-Leahy Scale: Fair                             ADL either performed or assessed with clinical judgement   ADL Overall ADL's : Needs assistance/impaired                     Lower Body Dressing: Sitting/lateral leans;Maximal assistance Lower Body Dressing Details (indicate cue type and reason): pt reports at baseline able to don his own socks but reports he needs assist for the grippy socks Toilet Transfer: Contact guard Actor;Ambulation;Rolling walker (2 wheels);Grab bars   Toileting- Clothing Manipulation and  Hygiene: Sitting/lateral lean;Modified independent       Functional mobility during ADLs: Contact guard assist;Rolling walker (2 wheels)       Vision         Perception         Praxis         Pertinent Vitals/Pain  Pain Assessment Pain Assessment: No/denies pain     Extremity/Trunk Assessment Upper Extremity Assessment Upper Extremity Assessment: Generalized weakness   Lower Extremity Assessment Lower Extremity Assessment: Defer to PT evaluation       Communication Communication Communication: Impaired Factors Affecting Communication: Hearing impaired   Cognition Arousal: Alert Behavior During Therapy: WFL for tasks assessed/performed Cognition: No family/caregiver present to determine baseline                               Following commands: Intact       Cueing  General Comments   Cueing Techniques: Verbal cues      Exercises     Shoulder Instructions      Home Living Family/patient expects to be discharged to:: Private residence Living Arrangements: Spouse/significant other Available Help at Discharge: Family Type of Home: House Home Access: Stairs to enter;Ramped entrance Entrance Stairs-Number of Steps: 3 Entrance Stairs-Rails: Right Home Layout: One level     Bathroom Shower/Tub: Chief Strategy Officer: Standard Bathroom Accessibility: Yes   Home Equipment: Agricultural Consultant (2 wheels);Shower seat;Wheelchair - manual;Lift chair          Prior Functioning/Environment Prior Level of Function : Independent/Modified Independent;History of Falls (last six months);Needs assist             Mobility Comments: pt reports limited household ambulation with RW, hx of falls ADLs Comments: Pt reports indep with basic ADL, seated shower, and assist for IADL except medication mgt. Wife drives. Pt receives Meals on Wheels    OT Problem List: Decreased strength;Decreased activity tolerance;Decreased knowledge of use of DME or AE;Impaired balance (sitting and/or standing)   OT Treatment/Interventions: Self-care/ADL training;Therapeutic exercise;Therapeutic activities;Energy conservation;DME and/or AE instruction;Patient/family education;Balance  training      OT Goals(Current goals can be found in the care plan section)   Acute Rehab OT Goals Patient Stated Goal: feel better and go home OT Goal Formulation: With patient Time For Goal Achievement: 10/07/24 Potential to Achieve Goals: Good ADL Goals Pt Will Perform Lower Body Dressing: with modified independence;sitting/lateral leans;sit to/from stand Pt Will Transfer to Toilet: with modified independence;ambulating (LRAD) Pt Will Perform Toileting - Clothing Manipulation and hygiene: with modified independence;sitting/lateral leans;sit to/from stand Additional ADL Goal #1: Pt will verbalize plan to implement at least 1 learned falls prevention strategy to maximize safety/indep with ADL and mobility.   OT Frequency:  Min 2X/week    Co-evaluation PT/OT/SLP Co-Evaluation/Treatment: Yes Reason for Co-Treatment: To address functional/ADL transfers;For patient/therapist safety PT goals addressed during session: Mobility/safety with mobility OT goals addressed during session: ADL's and self-care      AM-PAC OT 6 Clicks Daily Activity     Outcome Measure Help from another person eating meals?: None Help from another person taking care of personal grooming?: None Help from another person toileting, which includes using toliet, bedpan, or urinal?: A Little Help from another person bathing (including washing, rinsing, drying)?: A Little Help from another person to put on and taking off regular upper body clothing?: None Help from another person to put on and taking off regular lower body clothing?: A  Little 6 Click Score: 21   End of Session Equipment Utilized During Treatment: Gait belt;Rolling walker (2 wheels) Nurse Communication: Mobility status  Activity Tolerance: Patient tolerated treatment well Patient left: in chair;with call bell/phone within reach;with chair alarm set  OT Visit Diagnosis: Unsteadiness on feet (R26.81);Repeated falls (R29.6);Muscle weakness  (generalized) (M62.81)                Time: 8983-8953 OT Time Calculation (min): 30 min Charges:  OT General Charges $OT Visit: 1 Visit OT Evaluation $OT Eval Low Complexity: 1 Low OT Treatments $Self Care/Home Management : 8-22 mins  Warren SAUNDERS., MPH, MS, OTR/L ascom [phone] 09/23/24, 1:18 PM

## 2024-09-23 NOTE — Consult Note (Signed)
 PHARMACY - ANTICOAGULATION CONSULT NOTE  Pharmacy Consult for heparin  infusion Indication: Portal Vein thrombus  Allergies  Allergen Reactions   Lisinopril Swelling    Tongue swelling   Myrbetriq  [Mirabegron ] Swelling    Of the tongue   Beta Adrenergic Blockers     Other Reaction(s): Other (See Comments)  Symptomatic bradycardia Patient has been tolerating Propranolol  10 mg twice daily since 10/2022, with no complaints   Other Other (See Comments)    Symptomatic bradycardia    Triamterene     Other reaction(s): ITCHING,WATERING EYES  Other Reaction(s): ITCHING,WATERING EYES   Amlodipine Other (See Comments)    Peripheral edema   Atorvastatin Other (See Comments)    Dizziness and Fatigue   Dabigatran Etexilate Mesylate Other (See Comments)    Stomach ulcers (Pradaxa)   Dabigatran Etexilate Mesylate Other (See Comments)    Stomach ulcers (Pradaxa)   Latex Rash    Patient Measurements: Height: 5' 9 (175.3 cm) Weight: 60.2 kg (132 lb 11.5 oz) IBW/kg (Calculated) : 70.7  Vital Signs: Temp: 98.1 F (36.7 C) (11/07 1926) Temp Source: Oral (11/07 1926) BP: 136/51 (11/07 1926) Pulse Rate: 81 (11/07 1926)  Labs: Recent Labs    09/21/24 1708 09/21/24 2102 09/21/24 2340 09/22/24 0423 09/22/24 1400 09/22/24 2226  HGB 13.1  --   --  11.4*  --   --   HCT 38.9*  --   --  34.3*  --   --   PLT 71*  --   --  42*  --   --   APTT  --   --   --   --  37*  --   LABPROT  --   --   --   --  18.3*  --   INR  --   --   --   --  1.4*  --   HEPARINUNFRC  --   --   --   --   --  <0.10*  CREATININE 0.63  --   --  0.63  --   --   TROPONINIHS  --  7 6  --   --   --     Estimated Creatinine Clearance: 59.6 mL/min (by C-G formula based on SCr of 0.63 mg/dL).   Medical History: Past Medical History:  Diagnosis Date   Atrial fibrillation (HCC)    Basal cell carcinoma    Benign paroxysmal positional vertigo    Cancer (HCC)    Cellulitis of left foot 09/18/2023   Closed fracture  of distal end of left fibula with routine healing 09/15/2023   Heart palpitations 02/13/2020   History of chicken pox    History of measles    History of mumps    Hypertension    Mononeuritis of unspecified site    Other seborrheic keratosis    Stroke (HCC) 06/2020   Transient ischemic attack (TIA), and cerebral infarction without residual deficits(V12.54)    Traumatic subdural hematoma without loss of consciousness (HCC) 06/25/2020   Unspecified hypothyroidism     Medications:  No home anticoagulation per pharmacist review  Assessment: 83 yo male presented to ED with abdominal pain.  Patient found to have colitis and portal vein thrombus.  Pharmacy consulted to initiate heparin  infusion.  Baseline aPTT and PT/INR ordered.  Goal of Therapy:  Heparin  level 0.3-0.5 units/ml Monitor platelets by anticoagulation protocol: Yes   Plan:  Per provider due to low platelet count/increased bleeding risk use 0.3-0.5 therapeutic range and no bolus  11/7:  HL @ 2226 = < 0.1, SUBtherapeutic - Will increase drip rate to 1200 units/hr and recheck HL 8 hrs after rate change  CBC daily while on heparin   Taia Bramlett D, PharmD 09/23/2024,1:09 AM

## 2024-09-24 DIAGNOSIS — K529 Noninfective gastroenteritis and colitis, unspecified: Secondary | ICD-10-CM | POA: Diagnosis not present

## 2024-09-24 LAB — CBC
HCT: 29.1 % — ABNORMAL LOW (ref 39.0–52.0)
Hemoglobin: 9.6 g/dL — ABNORMAL LOW (ref 13.0–17.0)
MCH: 26.3 pg (ref 26.0–34.0)
MCHC: 33 g/dL (ref 30.0–36.0)
MCV: 79.7 fL — ABNORMAL LOW (ref 80.0–100.0)
Platelets: 44 K/uL — ABNORMAL LOW (ref 150–400)
RBC: 3.65 MIL/uL — ABNORMAL LOW (ref 4.22–5.81)
RDW: 13.5 % (ref 11.5–15.5)
WBC: 2 K/uL — ABNORMAL LOW (ref 4.0–10.5)
nRBC: 0 % (ref 0.0–0.2)

## 2024-09-24 LAB — BASIC METABOLIC PANEL WITH GFR
Anion gap: 6 (ref 5–15)
BUN: 13 mg/dL (ref 8–23)
CO2: 25 mmol/L (ref 22–32)
Calcium: 8.1 mg/dL — ABNORMAL LOW (ref 8.9–10.3)
Chloride: 104 mmol/L (ref 98–111)
Creatinine, Ser: 0.56 mg/dL — ABNORMAL LOW (ref 0.61–1.24)
GFR, Estimated: >60 mL/min (ref >60)
Glucose, Bld: 99 mg/dL (ref 70–99)
Potassium: 4.1 mmol/L (ref 3.5–5.1)
Sodium: 135 mmol/L (ref 135–145)

## 2024-09-24 LAB — HEPATIC FUNCTION PANEL
ALT: 9 U/L (ref 0–44)
AST: 17 U/L (ref 15–41)
Albumin: 2.9 g/dL — ABNORMAL LOW (ref 3.5–5.0)
Alkaline Phosphatase: 64 U/L (ref 38–126)
Bilirubin, Direct: 0.4 mg/dL — ABNORMAL HIGH (ref 0.0–0.2)
Indirect Bilirubin: 0.8 mg/dL (ref 0.3–0.9)
Total Bilirubin: 1.2 mg/dL (ref 0.0–1.2)
Total Protein: 6.2 g/dL — ABNORMAL LOW (ref 6.5–8.1)

## 2024-09-24 LAB — HEPARIN LEVEL (UNFRACTIONATED)
Heparin Unfractionated: 0.31 [IU]/mL (ref 0.30–0.70)
Heparin Unfractionated: 0.31 [IU]/mL (ref 0.30–0.70)

## 2024-09-24 LAB — PHOSPHORUS: Phosphorus: 2.9 mg/dL (ref 2.5–4.6)

## 2024-09-24 LAB — MAGNESIUM: Magnesium: 2 mg/dL (ref 1.7–2.4)

## 2024-09-24 MED ORDER — LACTULOSE 10 GM/15ML PO SOLN: 20.0000 g | Freq: Every day | ORAL | Status: DC

## 2024-09-24 NOTE — Consult Note (Signed)
 PHARMACY - ANTICOAGULATION CONSULT NOTE  Pharmacy Consult for heparin  infusion Indication: Portal Vein thrombus  Allergies  Allergen Reactions   Lisinopril Swelling    Tongue swelling   Myrbetriq  [Mirabegron ] Swelling    Of the tongue   Beta Adrenergic Blockers     Other Reaction(s): Other (See Comments)  Symptomatic bradycardia Patient has been tolerating Propranolol  10 mg twice daily since 10/2022, with no complaints   Other Other (See Comments)    Symptomatic bradycardia    Triamterene     Other reaction(s): ITCHING,WATERING EYES  Other Reaction(s): ITCHING,WATERING EYES   Amlodipine Other (See Comments)    Peripheral edema   Atorvastatin Other (See Comments)    Dizziness and Fatigue   Dabigatran Etexilate Mesylate Other (See Comments)    Stomach ulcers (Pradaxa)   Dabigatran Etexilate Mesylate Other (See Comments)    Stomach ulcers (Pradaxa)   Latex Rash    Patient Measurements: Height: 5' 9 (175.3 cm) Weight: 60.2 kg (132 lb 11.5 oz) IBW/kg (Calculated) : 70.7 HEPARIN  DW (KG): 60.2  Vital Signs: Temp: 98.2 F (36.8 C) (11/09 0710) Temp Source: Oral (11/09 0300) BP: 119/47 (11/09 0710) Pulse Rate: 64 (11/09 0710)  Labs: Recent Labs    09/21/24 2102 09/21/24 2340 09/22/24 0423 09/22/24 1400 09/22/24 2226 09/23/24 0341 09/23/24 1001 09/23/24 1432 09/23/24 2008 09/24/24 0554 09/24/24 1410  HGB  --   --  11.4*  --   --  9.9*  --  10.1*  --  9.6*  --   HCT  --   --  34.3*  --   --  29.5*  --  30.9*  --  29.1*  --   PLT  --   --  42*  --   --  37*  --  45*  --  44*  --   APTT  --   --   --  37*  --   --   --   --   --   --   --   LABPROT  --   --   --  18.3*  --   --   --   --   --   --   --   INR  --   --   --  1.4*  --   --   --   --   --   --   --   HEPARINUNFRC  --   --   --   --    < >  --    < > 0.11* 0.10* 0.31 0.31  CREATININE  --   --  0.63  --   --  0.60*  --   --   --  0.56*  --   TROPONINIHS 7 6  --   --   --   --   --   --   --   --   --     < > = values in this interval not displayed.    Estimated Creatinine Clearance: 59.6 mL/min (A) (by C-G formula based on SCr of 0.56 mg/dL (L)).   Medical History: Past Medical History:  Diagnosis Date   Atrial fibrillation (HCC)    Basal cell carcinoma    Benign paroxysmal positional vertigo    Cancer (HCC)    Cellulitis of left foot 09/18/2023   Closed fracture of distal end of left fibula with routine healing 09/15/2023   Heart palpitations 02/13/2020   History of chicken pox  History of measles    History of mumps    Hypertension    Mononeuritis of unspecified site    Other seborrheic keratosis    Stroke (HCC) 06/2020   Transient ischemic attack (TIA), and cerebral infarction without residual deficits(V12.54)    Traumatic subdural hematoma without loss of consciousness (HCC) 06/25/2020   Unspecified hypothyroidism     Medications:  No home anticoagulation per pharmacist review  Assessment: 83 yo male presented to ED with abdominal pain.  Patient found to have colitis and portal vein thrombus.  Pharmacy consulted to initiate heparin  infusion.  Baseline aPTT and PT/INR ordered.  11/7: HL @ 2226 = < 0.1, SUBtherapeutic  11/8 10001 HL <0.10, SUBtherapeutic 11/8 2008 HL 0.10, SUBtherapeutic 11/9 0554 HL 0.31, therapeutic X 1  11/9 1410 HL 0.31, therapeutic x 2  Goal of Therapy:  Heparin  level 0.3-0.5 units/ml Monitor platelets by anticoagulation protocol: Yes   Plan:  Per provider due to low platelet count/increased bleeding risk use 0.3-0.5 therapeutic range and no bolus  11/9 1410 HL 0.31, therapeutic x 2 Hgb 9.6  Plt 44 - continue pt on current rate and recheck HL with am labs - CBC daily while on heparin   Makyah Lavigne A Clinical Pharmacist 09/24/2024 2:39 PM

## 2024-09-24 NOTE — Progress Notes (Signed)
 [facility] Progress Note  Patient: Ian Sanders    FMW:[id number]  DOA: 09/21/2024     Date of Service: the patient was seen and examined on 09/24/2024  Chief Complaint  Patient presents with   Abdominal Pain   Fall   Brief hospital course:  Ian Sanders is a 83 y.o. male with PMH of HTN, TIA, stroke, paroxysmal A.Fib not on anticoagulation, cirrhosis, chronic thrombocytopenia who presents to the emergency department due to 1 day onset of abdominal pain in the upper abdomen.  Pain was described as sharp, intermittent and 10/10 on pain scale.  This was associated with nausea without vomiting and generalized weakness with difficulty being able to ambulate with a walker.   ED course Hemodynamically stable.  CBD: WBC 16.4, hematocrit 30.9, MCV 77.8, platelets 71.  BMP: sodium 132 rest wnl, blood glucose of 116.  Urinalysis was unimpressive for UTI, lactic acid was normal, troponin x 2 was normal.  Influenza A, B, SARS coronavirus 2, RSV was negative. CT a/p: large amount of portal vein and splenic vein thrombus.  Findings consistent with colitis involving the cecum and ascending colon He was treated with Zosyn, and IV hydration fluids.   Assessment and Plan:  # Acute colitis S/p IV hydration S/p IV Zosyn given in the ED, which has been continued.   IV morphine  2 mg q.4h p.r.n. for moderate to severe pain Continue IV Zofran  p.r.n. F/u blood culture x2 Started probiotic 11/9 diet FL >soft     # Portal vein thrombosis CT abdomen and pelvis was suggestive of portal and splenic vein thrombosis Anticoagulation will be held at this time due to thrombocytopenia (chronic) Heme-onc consulted, recommended to start heparin  IV infusion without bolus and then transition to Eliquis 2.5 mg p.o. twice daily if no bleeding. D/w Vascular surgery, if recommended no any other option then anticoagulation per Monitor CBC daily Pharmacy consulted for heparin  dosing and monitoring    Paroxysmal atrial fibrillation EKG personally reviewed showed normal sinus rhythm Patient not on anticoagulation Continued home meds Cardizem  and flecainide  Advised to follow-up with cardiology as an outpatient   Essential hypertension continue Cardizem  and losartan  Monitor BP and titrate medication according  Chronic neuropathy Continue gabapentin  home dose   GERD Continue Protonix    Cirrhosis with chronic thrombocytopenia Platelets 71, no signs of active bleeding Plts 42>37>45>44 Started lactulose , Titrate to 1-2 BM per day  # Hyponatremia, monitor sodium level daily # Hypokalemia, potassium repleted. # Hypomagnesemia, mag repleted. Monitor electrolytes daily and replete as needed  Body mass index is 19.6 kg/m.  Interventions:  Diet: soft diet DVT Prophylaxis: Heparin  IV infusion  Advance goals of care discussion: Full code  Family Communication: family was not present at bedside, at the time of interview.  The pt provided permission to discuss medical plan with the family. Opportunity was given to ask question and all questions were answered satisfactorily.   Disposition:  Pt is from home, admitted with multiple falls, colitis and portal vein thrombus, still on heparin  IV infusion, which precludes a safe discharge. Discharge to Home w/HH, when stable, most likely in 1 to 2 days  Subjective: No significant events overnight.  Lower abdominal pain is getting better, 3/10, patient did move bowels today to episodes.  Denied any other complaints.   Physical Exam: General: NAD, lying comfortably Appear in no distress, affect appropriate Eyes: PERRLA ENT: Oral Mucosa Clear, moist  Neck: no JVD,  Cardiovascular: S1 and S2 Present, no Murmur,  Respiratory: good respiratory  effort, Bilateral Air entry equal and Decreased, no Crackles, no wheezes Abdomen: BS Present, Soft, mild distention due to ascites, mild lower abdominal tenderness Skin: no rashes Extremities: no  Pedal edema, no calf tenderness Neurologic: without any new focal findings Gait not checked due to patient safety concerns  Vitals:   09/23/24 1426 09/23/24 2000 09/24/24 0300 09/24/24 0710  BP: (!) 120/51 (!) 128/51 (!) 119/44 (!) 119/47  Pulse: 64 73 66 64  Resp: 16 16 16 20   Temp: 98.3 F (36.8 C) 98.8 F (37.1 C) 98.3 F (36.8 C) 98.2 F (36.8 C)  TempSrc: Oral Oral Oral   SpO2: 98% 94% 93% 93%  Weight:      Height:        Intake/Output Summary (Last 24 hours) at 09/24/2024 1425 Last data filed at 09/24/2024 1300 Gross per 24 hour  Intake 1351.07 ml  Output --  Net 1351.07 ml   Filed Weights   09/21/24 1707  Weight: 60.2 kg    Data Reviewed: I have personally reviewed and interpreted daily labs, tele strips, imagings as discussed above. I reviewed all nursing notes, pharmacy notes, vitals, pertinent old records I have discussed plan of care as described above with RN and patient/family.  CBC: Recent Labs  Lab 09/21/24 1708 09/22/24 0423 09/23/24 0341 09/23/24 1432 09/24/24 0554  WBC 16.4* 7.6 3.9* 4.1 2.0*  HGB 13.1 11.4* 9.9* 10.1* 9.6*  HCT 38.9* 34.3* 29.5* 30.9* 29.1*  MCV 77.8* 77.6* 77.8* 80.1 79.7*  PLT 71* 42* 37* 45* 44*   Basic Metabolic Panel: Recent Labs  Lab 09/21/24 1708 09/22/24 0423 09/23/24 0341 09/24/24 0554  NA 132* 133* 133* 135  K 4.0 3.5 3.2* 4.1  CL 100 99 98 104  CO2 23 24 24 25   GLUCOSE 116* 89 98 99  BUN 21 20 20 13   CREATININE 0.63 0.63 0.60* 0.56*  CALCIUM  8.9 8.5* 8.0* 8.1*  MG  --  1.6* 2.0 2.0  PHOS  --  2.6 3.0 2.9    Studies: No results found.   Scheduled Meds:  diltiazem   120 mg Oral Daily   flecainide   50 mg Oral BID   gabapentin   100 mg Oral Q1200   And   gabapentin   100 mg Oral QPC lunch   And   gabapentin   300 mg Oral QHS   lactulose   20 g Oral Daily   losartan   25 mg Oral BID   pantoprazole   80 mg Oral Daily   saccharomyces boulardii  250 mg Oral BID   Continuous Infusions:  heparin  1,550  Units/hr (09/24/24 0414)   piperacillin-tazobactam (ZOSYN)  IV 3.375 g (09/24/24 0756)   PRN Meds: acetaminophen  **OR** acetaminophen , diclofenac Sodium, morphine  injection, ondansetron  **OR** ondansetron  (ZOFRAN ) IV  Time spent: 40 minutes  Author: ELVAN SOR. MD [facility] 09/24/2024 2:25 PM  To reach On-call, see care teams to locate the attending and reach out to them via [email or web address]. If 7PM-7AM, please contact night-coverage If you still have difficulty reaching the attending provider, please page the [facility] (Director on Call) for [facility] on amion for assistance.

## 2024-09-24 NOTE — TOC Progression Note (Addendum)
 Transition of Care [facility]) - Progression Note    Patient Details  Name: Ian Sanders MRN: [id number] Date of Birth: Oct 28, 1941  [facility] CM/SW Contact  Victory Jackquline GORMAN, RN Phone Number: 09/24/2024, 4:39 PM  Clinical Narrative:   RNCM met with patient and his wife at bedside. RNCM introduced role and explained that discharge planning would be discussed. PT is recommending HH/PT. Patient is in agreement. I went over the list of [facility] Agencies with him and left the list with him and his wife to look at and they will left me which one they are going to choose. I sent referrals via EPIC and will review agencies that accepted them with them once received. Patient has DME at home. [facility]). Wife will pick him up at the time of discharge. RNCM will continue to follow for discharge planning needs.                     Expected Discharge Plan and Services                                               Social Drivers of Health (SDOH) Interventions SDOH Screenings   Food Insecurity: Food Insecurity Present (09/22/2024)  Housing: Low Risk  (09/22/2024)  Transportation Needs: No Transportation Needs (09/22/2024)  Utilities: Not At Risk (09/22/2024)  Alcohol  Screen: Low Risk  (01/18/2024)  Depression (PHQ2-9): Medium Risk (06/23/2024)  Financial Resource Strain: Low Risk  (01/18/2024)  Physical Activity: Insufficiently Active (01/18/2024)  Social Connections: Moderately Isolated (09/22/2024)  Stress: No Stress Concern Present (01/18/2024)  Tobacco Use: High Risk (09/22/2024)  Health Literacy: Adequate Health Literacy (01/18/2024)    Readmission Risk Interventions     No data to display

## 2024-09-24 NOTE — Consult Note (Signed)
 PHARMACY - ANTICOAGULATION CONSULT NOTE  Pharmacy Consult for heparin  infusion Indication: Portal Vein thrombus  Allergies  Allergen Reactions   Lisinopril Swelling    Tongue swelling   Myrbetriq  [Mirabegron ] Swelling    Of the tongue   Beta Adrenergic Blockers     Other Reaction(s): Other (See Comments)  Symptomatic bradycardia Patient has been tolerating Propranolol  10 mg twice daily since 10/2022, with no complaints   Other Other (See Comments)    Symptomatic bradycardia    Triamterene     Other reaction(s): ITCHING,WATERING EYES  Other Reaction(s): ITCHING,WATERING EYES   Amlodipine Other (See Comments)    Peripheral edema   Atorvastatin Other (See Comments)    Dizziness and Fatigue   Dabigatran Etexilate Mesylate Other (See Comments)    Stomach ulcers (Pradaxa)   Dabigatran Etexilate Mesylate Other (See Comments)    Stomach ulcers (Pradaxa)   Latex Rash    Patient Measurements: Height: 5' 9 (175.3 cm) Weight: 60.2 kg (132 lb 11.5 oz) IBW/kg (Calculated) : 70.7 HEPARIN  DW (KG): 60.2  Vital Signs: Temp: 98.3 F (36.8 C) (11/09 0300) Temp Source: Oral (11/09 0300) BP: 119/44 (11/09 0300) Pulse Rate: 66 (11/09 0300)  Labs: Recent Labs    09/21/24 1708 09/21/24 2102 09/21/24 2340 09/22/24 0423 09/22/24 1400 09/22/24 2226 09/23/24 0341 09/23/24 1001 09/23/24 1432 09/23/24 2008 09/24/24 0554  HGB 13.1  --   --  11.4*  --   --  9.9*  --  10.1*  --  9.6*  HCT 38.9*  --   --  34.3*  --   --  29.5*  --  30.9*  --  29.1*  PLT 71*  --   --  42*  --   --  37*  --  45*  --  44*  APTT  --   --   --   --  37*  --   --   --   --   --   --   LABPROT  --   --   --   --  18.3*  --   --   --   --   --   --   INR  --   --   --   --  1.4*  --   --   --   --   --   --   HEPARINUNFRC  --   --   --   --   --    < >  --    < > 0.11* 0.10* 0.31  CREATININE 0.63  --   --  0.63  --   --  0.60*  --   --   --   --   TROPONINIHS  --  7 6  --   --   --   --   --   --   --   --     < > = values in this interval not displayed.    Estimated Creatinine Clearance: 59.6 mL/min (A) (by C-G formula based on SCr of 0.6 mg/dL (L)).   Medical History: Past Medical History:  Diagnosis Date   Atrial fibrillation (HCC)    Basal cell carcinoma    Benign paroxysmal positional vertigo    Cancer (HCC)    Cellulitis of left foot 09/18/2023   Closed fracture of distal end of left fibula with routine healing 09/15/2023   Heart palpitations 02/13/2020   History of chicken pox    History of measles  History of mumps    Hypertension    Mononeuritis of unspecified site    Other seborrheic keratosis    Stroke (HCC) 06/2020   Transient ischemic attack (TIA), and cerebral infarction without residual deficits(V12.54)    Traumatic subdural hematoma without loss of consciousness (HCC) 06/25/2020   Unspecified hypothyroidism     Medications:  No home anticoagulation per pharmacist review  Assessment: 83 yo male presented to ED with abdominal pain.  Patient found to have colitis and portal vein thrombus.  Pharmacy consulted to initiate heparin  infusion.  Baseline aPTT and PT/INR ordered.  11/7: HL @ 2226 = < 0.1, SUBtherapeutic  11/8 10001 HL <0.10, SUBtherapeutic 11/8 2008 HL 0.10, SUBtherapeutic 11/9 0554 HL 0.31, therapeutic X 1   Goal of Therapy:  Heparin  level 0.3-0.5 units/ml Monitor platelets by anticoagulation protocol: Yes   Plan:  Per provider due to low platelet count/increased bleeding risk use 0.3-0.5 therapeutic range and no bolus  11/9 0554:  HL = 0.31, therapeutic X 1 - continue pt on current rate and recheck HL in 8 hrs - CBC daily while on heparin   Dimonique Bourdeau D Clinical Pharmacist 09/24/2024 6:30 AM

## 2024-09-24 NOTE — Plan of Care (Signed)
  Problem: Education: Goal: Knowledge of General Education information will improve Description: Including pain rating scale, medication(s)/side effects and non-pharmacologic comfort measures Outcome: Progressing   Problem: Health Behavior/Discharge Planning: Goal: Ability to manage health-related needs will improve Outcome: Progressing   Problem: Clinical Measurements: Goal: Will remain free from infection Outcome: Progressing   Problem: Activity: Goal: Risk for activity intolerance will decrease Outcome: Progressing   Problem: Nutrition: Goal: Adequate nutrition will be maintained Outcome: Progressing   Problem: Coping: Goal: Level of anxiety will decrease Outcome: Progressing   Problem: Elimination: Goal: Will not experience complications related to bowel motility Outcome: Progressing Goal: Will not experience complications related to urinary retention Outcome: Progressing   Problem: Pain Managment: Goal: General experience of comfort will improve and/or be controlled Outcome: Progressing   Problem: Safety: Goal: Ability to remain free from injury will improve Outcome: Progressing   Problem: Skin Integrity: Goal: Risk for impaired skin integrity will decrease Outcome: Progressing

## 2024-09-24 NOTE — Care Management Important Message (Signed)
 Important Message  Patient Details  Name: Ian Sanders MRN: [id number] Date of Birth: 05-Mar-1941   Important Message Given:  Yes - Medicare IM     [name] 09/24/2024, 5:49 PM

## 2024-09-25 ENCOUNTER — Telehealth (HOSPITAL_COMMUNITY): Payer: Self-pay | Admitting: Pharmacy Technician

## 2024-09-25 ENCOUNTER — Other Ambulatory Visit (HOSPITAL_COMMUNITY): Payer: Self-pay

## 2024-09-25 DIAGNOSIS — K529 Noninfective gastroenteritis and colitis, unspecified: Secondary | ICD-10-CM | POA: Diagnosis not present

## 2024-09-25 LAB — GASTROINTESTINAL PANEL BY PCR, STOOL (REPLACES STOOL CULTURE)

## 2024-09-25 LAB — CBC
HCT: 30.4 % — ABNORMAL LOW (ref 39.0–52.0)
Hemoglobin: 10 g/dL — ABNORMAL LOW (ref 13.0–17.0)
MCH: 26.3 pg (ref 26.0–34.0)
MCHC: 32.9 g/dL (ref 30.0–36.0)
MCV: 80 fL (ref 80.0–100.0)
Platelets: 47 K/uL — ABNORMAL LOW (ref 150–400)
RBC: 3.8 MIL/uL — ABNORMAL LOW (ref 4.22–5.81)
RDW: 13.4 % (ref 11.5–15.5)
WBC: 1.8 K/uL — ABNORMAL LOW (ref 4.0–10.5)
nRBC: 0 % (ref 0.0–0.2)

## 2024-09-25 LAB — HEPATIC FUNCTION PANEL
ALT: 9 U/L (ref 0–44)
AST: 18 U/L (ref 15–41)
Albumin: 2.9 g/dL — ABNORMAL LOW (ref 3.5–5.0)
Alkaline Phosphatase: 68 U/L (ref 38–126)
Bilirubin, Direct: 0.3 mg/dL — ABNORMAL HIGH (ref 0.0–0.2)
Indirect Bilirubin: 0.7 mg/dL (ref 0.3–0.9)
Total Bilirubin: 1 mg/dL (ref 0.0–1.2)
Total Protein: 6.5 g/dL (ref 6.5–8.1)

## 2024-09-25 LAB — BASIC METABOLIC PANEL WITH GFR
Anion gap: 12 (ref 5–15)
BUN: 10 mg/dL (ref 8–23)
CO2: 24 mmol/L (ref 22–32)
Calcium: 8.2 mg/dL — ABNORMAL LOW (ref 8.9–10.3)
Chloride: 99 mmol/L (ref 98–111)
Creatinine, Ser: 0.7 mg/dL (ref 0.61–1.24)
GFR, Estimated: >60 mL/min (ref >60)
Glucose, Bld: 101 mg/dL — ABNORMAL HIGH (ref 70–99)
Potassium: 3.9 mmol/L (ref 3.5–5.1)
Sodium: 135 mmol/L (ref 135–145)

## 2024-09-25 LAB — MAGNESIUM: Magnesium: 1.8 mg/dL (ref 1.7–2.4)

## 2024-09-25 LAB — PHOSPHORUS: Phosphorus: 2.3 mg/dL — ABNORMAL LOW (ref 2.5–4.6)

## 2024-09-25 LAB — HEPARIN LEVEL (UNFRACTIONATED): Heparin Unfractionated: 0.25 [IU]/mL — ABNORMAL LOW (ref 0.30–0.70)

## 2024-09-25 MED ORDER — APIXABAN 2.5 MG PO TABS
2.5000 mg | ORAL_TABLET | Freq: Two times a day (BID) | ORAL | Status: DC
  Administered 2024-09-25 – 2024-09-27 (×5): 2.5 mg via ORAL
  Filled 2024-09-25 (×5): qty 1

## 2024-09-25 MED ORDER — K PHOS MONO-SOD PHOS DI & MONO 155-852-130 MG PO TABS
500.0000 mg | ORAL_TABLET | Freq: Four times a day (QID) | ORAL | Status: AC
  Administered 2024-09-25 (×2): 500 mg via ORAL
  Filled 2024-09-25 (×2): qty 2

## 2024-09-25 NOTE — Progress Notes (Signed)
 Occupational Therapy Treatment Patient Details Name: Ian Sanders MRN: [id number] DOB: 1941-09-22 Today's Date: 09/25/2024   History of present illness Pt is an 83 y.o. male who presents to the ED due to 1 day onset of abdominal pain in the upper abdomen associated with nausea and generalized weakness with difficulty ambulating and 2 falls. Current MD assessment: acute colitis, portal vein thrombosis. PMH of hypertension, TIA, stroke, GERD, neuropathy, paroxysmal atrial fibrillation not on anticoagulation, cirrhosis requiring thrombocytopenia   OT comments  Pt seen for OT tx. Pt agreeable. Pt required SBA for ADL transfers from EOB and from std toilet height this date with RW and increased time/effort noted to complete. Pt tolerated ambulating roughly 68' + 40' with RW and SBA-CGA while instructed in activity pacing, work simplification, rest breaks to support safety. Pt denied SOB throughout and demo'd fair ability to understand and abide by his activity tolerance limitations to maximize safety. Pt progressing well.       If plan is discharge home, recommend the following:  A little help with walking and/or transfers;A little help with bathing/dressing/bathroom;Assistance with cooking/housework;Assist for transportation;Help with stairs or ramp for entrance   Equipment Recommendations  None recommended by OT    Recommendations for Other Services      Precautions / Restrictions Precautions Precautions: Fall Recall of Precautions/Restrictions: Intact Precaution/Restrictions Comments: reports 2-3 recent falls. Restrictions Weight Bearing Restrictions Per Provider Order: No       Mobility Bed Mobility Overal bed mobility: Needs Assistance Bed Mobility: Supine to Sit, Sit to Supine       Sit to supine: Supervision        Transfers Overall transfer level: Needs assistance Equipment used: Rolling walker (2 wheels) Transfers: Sit to/from Stand Sit to Stand: Supervision            General transfer comment: increased time/effort to complete     Balance Overall balance assessment: Needs assistance Sitting-balance support: Feet supported Sitting balance-Leahy Scale: Good   Postural control: Posterior lean Standing balance support: Reliant on assistive device for balance, No upper extremity supported, Single extremity supported Standing balance-Leahy Scale: Good Standing balance comment: performed functional standing balance/hygiene with intermittent UE support with no LOB.                           ADL either performed or assessed with clinical judgement   ADL Overall ADL's : Needs assistance/impaired                         Toilet Transfer: Supervision/safety;Regular Toilet;Rolling walker (2 wheels);Ambulation;Grab bars   Toileting- Clothing Manipulation and Hygiene: Modified independent;Sitting/lateral lean               Communication Communication Communication: Impaired Factors Affecting Communication: Hearing impaired   Cognition Arousal: Alert Behavior During Therapy: WFL for tasks assessed/performed        Following commands: Intact        Cueing   Cueing Techniques: Verbal cues  Exercises Other Exercises Other Exercises: Pt ambulated ~40' + 40' with RW and SBA-CGA while instructed in activity pacing, work simplification, rest breaks to support safety            Pertinent Vitals/ Pain       Pain Assessment Pain Assessment: No/denies pain   Frequency  Min 2X/week        Progress Toward Goals  OT Goals(current goals can now be found in the  care plan section)  Progress towards OT goals: Progressing toward goals  Acute Rehab OT Goals Patient Stated Goal: feel better and go home OT Goal Formulation: With patient Time For Goal Achievement: 10/07/24 Potential to Achieve Goals: Good   AM-PAC OT 6 Clicks Daily Activity     Outcome Measure   Help from another person eating meals?:  None Help from another person taking care of personal grooming?: None Help from another person toileting, which includes using toliet, bedpan, or urinal?: A Little Help from another person bathing (including washing, rinsing, drying)?: A Little Help from another person to put on and taking off regular upper body clothing?: None Help from another person to put on and taking off regular lower body clothing?: A Little 6 Click Score: 21    End of Session Equipment Utilized During Treatment: Rolling walker (2 wheels)  OT Visit Diagnosis: Unsteadiness on feet (R26.81);Repeated falls (R29.6);Muscle weakness (generalized) (M62.81)   Activity Tolerance Patient tolerated treatment well   Patient Left in bed;with call bell/phone within reach;with bed alarm set   Nurse Communication          Time: 8645-8584 OT Time Calculation (min): 21 min  Charges: OT General Charges $OT Visit: 1 Visit OT Treatments $Self Care/Home Management : 8-22 mins  Warren SAUNDERS., MPH, MS, OTR/L ascom [phone] 09/25/24, 4:17 PM

## 2024-09-25 NOTE — Progress Notes (Signed)
 Mobility Specialist Progress Note:    09/25/24 1648  Mobility  Activity Ambulated with assistance  Level of Assistance Contact guard assist, steadying assist  Assistive Device Front wheel walker  Distance Ambulated (ft) 10 ft  Range of Motion/Exercises Active;All extremities  Activity Response Tolerated well  Mobility visit 1 Mobility  Mobility Specialist Start Time (ACUTE ONLY) 1640  Mobility Specialist Stop Time (ACUTE ONLY) 1648  Mobility Specialist Time Calculation (min) (ACUTE ONLY) 8 min   Pt received requesting assistance. Required CGA to stand and ambulate with RW. Tolerated well, asx throughout. Left in bathroom to pull string, wife in room and NT notified. All needs met.  Sherrilee Ditty Mobility Specialist Please contact via Special Educational Needs Teacher or  Rehab office at [phone]

## 2024-09-25 NOTE — Progress Notes (Signed)
 Physical Therapy Treatment Patient Details Name: Ian Sanders MRN: [id number] DOB: [birth year] Today's Date: 09/25/2024   History of Present Illness Pt is an 83 y.o. male who presents to the ED due to 1 day onset of abdominal pain in the upper abdomen associated with nausea and generalized weakness with difficulty ambulating and 2 falls. Current MD assessment: acute colitis, portal vein thrombosis. PMH of hypertension, TIA, stroke, GERD, neuropathy, paroxysmal atrial fibrillation not on anticoagulation, cirrhosis requiring thrombocytopenia    PT Comments  Pt's mobility is progressing to an overall CGA-supervision level with RW.  Pt continues to experience limitations to mobility, demonstrating decreased activity tolerance, general weakness, slow balance reactions.   Pt will benefit from continued PT services upon discharge to safely address deficits listed in patient problem list for decreased caregiver assistance and eventual return to PLOF.     If plan is discharge home, recommend the following: A little help with walking and/or transfers;A little help with bathing/dressing/bathroom;Assistance with cooking/housework;Assist for transportation;Help with stairs or ramp for entrance   Can travel by private vehicle        Equipment Recommendations  None recommended by PT    Recommendations for Other Services       Precautions / Restrictions Precautions Precautions: Fall Precaution/Restrictions Comments: reports 2-3 recent falls. Restrictions Weight Bearing Restrictions Per Provider Order: No     Mobility  Bed Mobility Overal bed mobility: Needs Assistance Bed Mobility: Supine to Sit, Sit to Supine     Supine to sit: Supervision Sit to supine: Supervision   General bed mobility comments: requires extra time/effort    Transfers Overall transfer level: Needs assistance Equipment used: Rolling walker (2 wheels) Transfers: Sit to/from Stand, Bed to chair/wheelchair/BSC Sit to  Stand: Contact guard assist   Step pivot transfers: Contact guard assist       General transfer comment: increased time/effort to complete    Ambulation/Gait Ambulation/Gait assistance: Supervision Gait Distance (Feet): 130 Feet Assistive device: Rolling walker (2 wheels) Gait Pattern/deviations: Decreased step length - right, Decreased step length - left, Step-through pattern       General Gait Details: pt ambulates with RW, slight flexed posture, normal base of support,  foot clearance WFL   Stairs             Wheelchair Mobility     Tilt Bed    Modified Rankin (Stroke Patients Only)       Balance Overall balance assessment: Needs assistance Sitting-balance support: Feet supported Sitting balance-Leahy Scale: Good     Standing balance support: Reliant on assistive device for balance, No upper extremity supported, Single extremity supported Standing balance-Leahy Scale: Good Standing balance comment: performed functional standing balance/hygiene with intermittent UE support with no LOB.                            Communication Communication Communication: Impaired Factors Affecting Communication: Hearing impaired  Cognition Arousal: Alert Behavior During Therapy: WFL for tasks assessed/performed                             Following commands: Intact      Cueing Cueing Techniques: Verbal cues  Exercises      General Comments        Pertinent Vitals/Pain Pain Assessment Pain Assessment: No/denies pain    Home Living  Prior Function            PT Goals (current goals can now be found in the care plan section) Acute Rehab PT Goals Patient Stated Goal: to get stronger PT Goal Formulation: With patient Time For Goal Achievement: 10/07/24 Potential to Achieve Goals: Good Progress towards PT goals: Progressing toward goals    Frequency    Min 2X/week      PT Plan       Co-evaluation              AM-PAC PT 6 Clicks Mobility   Outcome Measure  Help needed turning from your back to your side while in a flat bed without using bedrails?: None Help needed moving from lying on your back to sitting on the side of a flat bed without using bedrails?: A Little Help needed moving to and from a bed to a chair (including a wheelchair)?: A Little Help needed standing up from a chair using your arms (e.g., wheelchair or bedside chair)?: A Little Help needed to walk in hospital room?: A Little Help needed climbing 3-5 steps with a railing? : A Little 6 Click Score: 19    End of Session Equipment Utilized During Treatment: Gait belt Activity Tolerance: Patient tolerated treatment well Patient left: in bed;with call bell/phone within reach;with bed alarm set Nurse Communication: Mobility status PT Visit Diagnosis: Unsteadiness on feet (R26.81);Muscle weakness (generalized) (M62.81)     Time: 8866-8843 PT Time Calculation (min) (ACUTE ONLY): 23 min  Charges:    $Therapeutic Activity: 23-37 mins PT General Charges $$ ACUTE PT VISIT: 1 Visit                     Harland Irving, PTA  09/25/24, 12:05 PM

## 2024-09-25 NOTE — Consult Note (Signed)
 PHARMACY - ANTICOAGULATION CONSULT NOTE  Pharmacy Consult for heparin  infusion Indication: Portal Vein thrombus  Allergies  Allergen Reactions   Lisinopril Swelling    Tongue swelling   Myrbetriq  [Mirabegron ] Swelling    Of the tongue   Beta Adrenergic Blockers     Other Reaction(s): Other (See Comments)  Symptomatic bradycardia Patient has been tolerating Propranolol  10 mg twice daily since 10/2022, with no complaints   Other Other (See Comments)    Symptomatic bradycardia    Triamterene     Other reaction(s): ITCHING,WATERING EYES  Other Reaction(s): ITCHING,WATERING EYES   Amlodipine Other (See Comments)    Peripheral edema   Atorvastatin Other (See Comments)    Dizziness and Fatigue   Dabigatran Etexilate Mesylate Other (See Comments)    Stomach ulcers (Pradaxa)   Dabigatran Etexilate Mesylate Other (See Comments)    Stomach ulcers (Pradaxa)   Latex Rash    Patient Measurements: Height: 5' 9 (175.3 cm) Weight: 60.2 kg (132 lb 11.5 oz) IBW/kg (Calculated) : 70.7 HEPARIN  DW (KG): 60.2  Vital Signs: Temp: 98.5 F (36.9 C) (11/10 0317) BP: 134/56 (11/10 0317) Pulse Rate: 70 (11/10 0317)  Labs: Recent Labs    09/22/24 1400 09/22/24 2226 09/23/24 0341 09/23/24 1001 09/23/24 1432 09/23/24 2008 09/24/24 0554 09/24/24 1410 09/25/24 0520  HGB  --    < > 9.9*  --  10.1*  --  9.6*  --  10.0*  HCT  --    < > 29.5*  --  30.9*  --  29.1*  --  30.4*  PLT  --    < > 37*  --  45*  --  44*  --  47*  APTT 37*  --   --   --   --   --   --   --   --   LABPROT 18.3*  --   --   --   --   --   --   --   --   INR 1.4*  --   --   --   --   --   --   --   --   HEPARINUNFRC  --    < >  --    < > 0.11*   < > 0.31 0.31 0.25*  CREATININE  --   --  0.60*  --   --   --  0.56*  --   --    < > = values in this interval not displayed.    Estimated Creatinine Clearance: 59.6 mL/min (A) (by C-G formula based on SCr of 0.56 mg/dL (L)).   Medical History: Past Medical History:   Diagnosis Date   Atrial fibrillation (HCC)    Basal cell carcinoma    Benign paroxysmal positional vertigo    Cancer (HCC)    Cellulitis of left foot 09/18/2023   Closed fracture of distal end of left fibula with routine healing 09/15/2023   Heart palpitations 02/13/2020   History of chicken pox    History of measles    History of mumps    Hypertension    Mononeuritis of unspecified site    Other seborrheic keratosis    Stroke (HCC) 06/2020   Transient ischemic attack (TIA), and cerebral infarction without residual deficits(V12.54)    Traumatic subdural hematoma without loss of consciousness (HCC) 06/25/2020   Unspecified hypothyroidism     Medications:  No home anticoagulation per pharmacist review  Assessment: 83 yo male presented to ED with  abdominal pain.  Patient found to have colitis and portal vein thrombus.  Pharmacy consulted to initiate heparin  infusion.  Baseline aPTT and PT/INR ordered.  11/7: HL @ 2226 = < 0.1, SUBtherapeutic  11/8 10001 HL <0.10, SUBtherapeutic 11/8 2008 HL 0.10, SUBtherapeutic 11/9 0554 HL 0.31, therapeutic X 1  11/9 1410 HL 0.31, therapeutic x 2 11/10 0520 HL 0.25, subtherapeutic   Platelets stable 11/10 AM  Goal of Therapy:  Heparin  level 0.3-0.5 units/ml Monitor platelets by anticoagulation protocol: Yes   Plan:  Per provider due to low platelet count/increased bleeding risk use 0.3-0.5 therapeutic range and no bolus Heparin  level subtherapeutic this AM at 0.25, increase heparin  infusion rate to 1600 units/hr Check heparin  level 8 hours after rate change Monitor CBC daily while on heparin    Lum VEAR Mania, PharmD, BCPS Clinical Pharmacist 09/25/2024 7:17 AM

## 2024-09-25 NOTE — Progress Notes (Signed)
 [facility] Progress Note  Patient: Ian Sanders    FMW:[id number]  DOA: 09/21/2024     Date of Service: the patient was seen and examined on 09/25/2024  Chief Complaint  Patient presents with   Abdominal Pain   Fall   Brief hospital course:  Ian Sanders is a 83 y.o. male with PMH of HTN, TIA, stroke, paroxysmal A.Fib not on anticoagulation, cirrhosis, chronic thrombocytopenia who presents to the emergency department due to 1 day onset of abdominal pain in the upper abdomen.  Pain was described as sharp, intermittent and 10/10 on pain scale.  This was associated with nausea without vomiting and generalized weakness with difficulty being able to ambulate with a walker.   ED course Hemodynamically stable.  CBD: WBC 16.4, hematocrit 30.9, MCV 77.8, platelets 71.  BMP: sodium 132 rest wnl, blood glucose of 116.  Urinalysis was unimpressive for UTI, lactic acid was normal, troponin x 2 was normal.  Influenza A, B, SARS coronavirus 2, RSV was negative. CT a/p: large amount of portal vein and splenic vein thrombus.  Findings consistent with colitis involving the cecum and ascending colon He was treated with Zosyn, and IV hydration fluids.   Assessment and Plan:  # Acute colitis secondary to E. coli S/p IV hydration S/p IV Zosyn given in the ED, which has been continued.   IV morphine  2 mg q.4h p.r.n. for moderate to severe pain Continue IV Zofran  p.r.n. blood culture NGTD Started probiotic 11/9 diet FL >soft   11/10 developed loose stools, 3 BM today, denied any other complaints, abd pain is getting better  # Portal vein thrombosis CT abdomen and pelvis was suggestive of portal and splenic vein thrombosis Anticoagulation will be held at this time due to thrombocytopenia (chronic) Heme-onc consulted, recommended to start heparin  IV infusion without bolus and then transition to Eliquis 2.5 mg p.o. twice daily if no bleeding. D/w Vascular surgery, if recommended no any other  option then anticoagulation per Monitor CBC daily Pharmacy consulted for heparin  dosing and monitoring  11/10 DC'd heparin , started Eliquis 2.5 mg p.o. twice daily  Paroxysmal atrial fibrillation EKG personally reviewed showed normal sinus rhythm Patient not on anticoagulation Continued home meds Cardizem  and flecainide  Advised to follow-up with cardiology as an outpatient   Essential hypertension continue Cardizem  and losartan  Monitor BP and titrate medication according  Chronic neuropathy Continue gabapentin  home dose   GERD Continue Protonix    Cirrhosis with chronic thrombocytopenia Platelets 71, no signs of active bleeding Plts 42>37>45>44>47 Started lactulose , Titrate to 1-2 BM per day  # Hyponatremia, monitor sodium level daily # Hypokalemia, potassium repleted. # Hypomagnesemia, mag repleted. # Hypophosphatemia, Phos repleted. Monitor electrolytes daily and replete as needed  Body mass index is 19.6 kg/m.  Interventions:  Diet: soft diet DVT Prophylaxis: Heparin  IV infusion  Advance goals of care discussion: Full code  Family Communication: family was not present at bedside, at the time of interview.  The pt provided permission to discuss medical plan with the family. Opportunity was given to ask question and all questions were answered satisfactorily.   Disposition:  Pt is from home, admitted with multiple falls, colitis and portal vein thrombus, still on heparin  IV infusion, which precludes a safe discharge. Discharge to Home w/HH, when stable, most likely in 1 to 2 days  Subjective: No significant events overnight.  Lower abdominal pain is getting better, 3/10, patient did move bowels today to episodes.  Denied any other complaints.   Physical Exam: General:  NAD, lying comfortably Appear in no distress, affect appropriate Eyes: PERRLA ENT: Oral Mucosa Clear, moist  Neck: no JVD,  Cardiovascular: S1 and S2 Present, no Murmur,  Respiratory: good  respiratory effort, Bilateral Air entry equal and Decreased, no Crackles, no wheezes Abdomen: BS Present, Soft, mild distention due to ascites, mild lower abdominal tenderness Skin: no rashes Extremities: no Pedal edema, no calf tenderness Neurologic: without any new focal findings Gait not checked due to patient safety concerns  Vitals:   09/24/24 1752 09/24/24 2030 09/25/24 0317 09/25/24 0800  BP: (!) 120/50 (!) 125/46 (!) 134/56 128/68  Pulse:  72 70   Resp: 18 17 17 20   Temp: 98 F (36.7 C) 98.4 F (36.9 C) 98.5 F (36.9 C) 98.7 F (37.1 C)  TempSrc:    Oral  SpO2: 94% 95% 93% 95%  Weight:      Height:        Intake/Output Summary (Last 24 hours) at 09/25/2024 1500 Last data filed at 09/25/2024 1300 Gross per 24 hour  Intake 1344.8 ml  Output 150 ml  Net 1194.8 ml   Filed Weights   09/21/24 1707  Weight: 60.2 kg    Data Reviewed: I have personally reviewed and interpreted daily labs, tele strips, imagings as discussed above. I reviewed all nursing notes, pharmacy notes, vitals, pertinent old records I have discussed plan of care as described above with RN and patient/family.  CBC: Recent Labs  Lab 09/22/24 0423 09/23/24 0341 09/23/24 1432 09/24/24 0554 09/25/24 0520  WBC 7.6 3.9* 4.1 2.0* 1.8*  HGB 11.4* 9.9* 10.1* 9.6* 10.0*  HCT 34.3* 29.5* 30.9* 29.1* 30.4*  MCV 77.6* 77.8* 80.1 79.7* 80.0  PLT 42* 37* 45* 44* 47*   Basic Metabolic Panel: Recent Labs  Lab 09/21/24 1708 09/22/24 0423 09/23/24 0341 09/24/24 0554 09/25/24 0520  NA 132* 133* 133* 135 135  K 4.0 3.5 3.2* 4.1 3.9  CL 100 99 98 104 99  CO2 23 24 24 25 24   GLUCOSE 116* 89 98 99 101*  BUN 21 20 20 13 10   CREATININE 0.63 0.63 0.60* 0.56* 0.70  CALCIUM  8.9 8.5* 8.0* 8.1* 8.2*  MG  --  1.6* 2.0 2.0 1.8  PHOS  --  2.6 3.0 2.9 2.3*    Studies: No results found.   Scheduled Meds:  apixaban  2.5 mg Oral BID   diltiazem   120 mg Oral Daily   flecainide   50 mg Oral BID   gabapentin    100 mg Oral Q1200   And   gabapentin   100 mg Oral QPC lunch   And   gabapentin   300 mg Oral QHS   losartan   25 mg Oral BID   pantoprazole   80 mg Oral Daily   saccharomyces boulardii  250 mg Oral BID   Continuous Infusions:  piperacillin-tazobactam (ZOSYN)  IV 3.375 g (09/25/24 0941)   PRN Meds: acetaminophen  **OR** acetaminophen , diclofenac Sodium, morphine  injection, ondansetron  **OR** ondansetron  (ZOFRAN ) IV  Time spent: 40 minutes  Author: ELVAN SOR. MD [facility] 09/25/2024 3:00 PM  To reach On-call, see care teams to locate the attending and reach out to them via [email or web address]. If 7PM-7AM, please contact night-coverage If you still have difficulty reaching the attending provider, please page the [facility] (Director on Call) for [facility] on amion for assistance.

## 2024-09-25 NOTE — Telephone Encounter (Signed)
 Patient Product/process development scientist completed.    The patient is insured through [employer]/ [employer]. Patient has Medicare and is not eligible for a copay card, but may be able to apply for patient assistance or Medicare RX Payment Plan (Patient Must reach out to their plan, if eligible for payment plan), if available.    Ran test claim for Eliquis 5 mg and the current 30 day co-pay is $47.00.  Ran test claim for Xarelto 20 mg and the current 30 day co-pay is $47.00.  This test claim was processed through [facility]- copay amounts may vary at other pharmacies due to pharmacy/plan contracts, or as the patient moves through the different stages of their insurance plan.     Reyes Sharps, CPHT Pharmacy Technician Patient Advocate Specialist Lead [facility] Patient Advocate Team Direct Number: [phone]  Fax: [phone]

## 2024-09-26 DIAGNOSIS — K529 Noninfective gastroenteritis and colitis, unspecified: Secondary | ICD-10-CM | POA: Diagnosis not present

## 2024-09-26 LAB — BASIC METABOLIC PANEL WITH GFR
Anion gap: 9 (ref 5–15)
BUN: 10 mg/dL (ref 8–23)
CO2: 25 mmol/L (ref 22–32)
Calcium: 8.2 mg/dL — ABNORMAL LOW (ref 8.9–10.3)
Chloride: 103 mmol/L (ref 98–111)
Creatinine, Ser: 0.49 mg/dL — ABNORMAL LOW (ref 0.61–1.24)
GFR, Estimated: >60 mL/min (ref >60)
Glucose, Bld: 93 mg/dL (ref 70–99)
Potassium: 3.8 mmol/L (ref 3.5–5.1)
Sodium: 137 mmol/L (ref 135–145)

## 2024-09-26 LAB — CBC
HCT: 26.3 % — ABNORMAL LOW (ref 39.0–52.0)
Hemoglobin: 8.5 g/dL — ABNORMAL LOW (ref 13.0–17.0)
MCH: 26.2 pg (ref 26.0–34.0)
MCHC: 32.3 g/dL (ref 30.0–36.0)
MCV: 81.2 fL (ref 80.0–100.0)
Platelets: 56 K/uL — ABNORMAL LOW (ref 150–400)
RBC: 3.24 MIL/uL — ABNORMAL LOW (ref 4.22–5.81)
RDW: 13.4 % (ref 11.5–15.5)
WBC: 1.3 K/uL — CL (ref 4.0–10.5)
nRBC: 0 % (ref 0.0–0.2)

## 2024-09-26 NOTE — Progress Notes (Signed)
 [facility] Progress Note  Patient: Ian Sanders    FMW:[id number]  DOA: 09/21/2024     Date of Service: the patient was seen and examined on 09/26/2024  Chief Complaint  Patient presents with   Abdominal Pain   Fall   Brief hospital course: Ian Sanders is a 83 y.o. male with PMH of HTN, TIA, stroke, paroxysmal A.Fib not on anticoagulation, cirrhosis, chronic thrombocytopenia who presents to the emergency department due to 1 day onset of abdominal pain in the upper abdomen.  Pain was described as sharp, intermittent and 10/10 on pain scale.  This was associated with nausea without vomiting and generalized weakness with difficulty being able to ambulate with a walker.   ED course Hemodynamically stable.  CBD: WBC 16.4, hematocrit 30.9, MCV 77.8, platelets 71.  BMP: sodium 132 rest wnl, blood glucose of 116.  Urinalysis was unimpressive for UTI, lactic acid was normal, troponin x 2 was normal.  Influenza A, B, SARS coronavirus 2, RSV was negative. CT a/p: large amount of portal vein and splenic vein thrombus.  Findings consistent with colitis involving the cecum and ascending colon He was treated with Zosyn, and IV hydration fluids.   Assessment and Plan:  # Acute colitis secondary to E. coli S/p IV hydration S/p IV Zosyn given in the ED, which has been continued.   IV morphine  2 mg q.4h p.r.n. for moderate to severe pain Continue IV Zofran  p.r.n. blood culture NGTD Started probiotic 11/9 diet FL >soft   11/10 developed loose stools, 3 BM today, denied any other complaints, abd pain is getting better  # Portal vein thrombosis CT abdomen and pelvis was suggestive of portal and splenic vein thrombosis Anticoagulation will be held at this time due to thrombocytopenia (chronic) Heme-onc consulted, recommended to start heparin  IV infusion without bolus and then transition to Eliquis 2.5 mg p.o. twice daily if no bleeding. D/w Vascular surgery, if recommended no any other  option then anticoagulation per Monitor CBC daily Pharmacy consulted for heparin  dosing and monitoring  11/10 DC'd heparin , started Eliquis 2.5 mg p.o. twice daily  Paroxysmal atrial fibrillation EKG personally reviewed showed normal sinus rhythm Patient not on anticoagulation Continued home meds Cardizem  and flecainide  Advised to follow-up with cardiology as an outpatient   Essential hypertension continue Cardizem  and losartan  Monitor BP and titrate medication according  Chronic neuropathy Continue gabapentin  home dose   GERD Continue Protonix    Cirrhosis with chronic thrombocytopenia Platelets 71, no signs of active bleeding Plts 42>37>45>44>47>56 Started lactulose , Titrate to 1-2 BM per day  # Pancytopenia, monitor CBC daily  # Hyponatremia, monitor sodium level daily # Hypokalemia, potassium repleted. # Hypomagnesemia, mag repleted. # Hypophosphatemia, Phos repleted. Monitor electrolytes daily and replete as needed  Body mass index is 19.6 kg/m.  Interventions:  Diet: soft diet DVT Prophylaxis: Eliquis  Advance goals of care discussion: Full code  Family Communication: family was not present at bedside, at the time of interview.  The pt provided permission to discuss medical plan with the family. Opportunity was given to ask question and all questions were answered satisfactorily.   Disposition:  Pt is from home, admitted with multiple falls, colitis and portal vein thrombus, still on heparin  IV infusion, which precludes a safe discharge. Discharge to Home w/HH, when stable, most likely tomorrow a.m.  Subjective: No significant events overnight.  Abdominal pain is well-controlled, patient had 2 bowel movements today since morning and had 5 bowel movements yesterday, bowels are still loose, but getting  better, they are not watery as they were. Denied any chest pain or palpitation, no shortness of breath    Physical Exam: General: NAD, lying  comfortably Appear in no distress, affect appropriate Eyes: PERRLA ENT: Oral Mucosa Clear, moist  Neck: no JVD,  Cardiovascular: S1 and S2 Present, no Murmur,  Respiratory: good respiratory effort, Bilateral Air entry equal and Decreased, no Crackles, no wheezes Abdomen: BS Present, Soft, mild distention possible due to ascites?, mild lower abdominal tenderness Skin: no rashes Extremities: no Pedal edema, no calf tenderness Neurologic: without any new focal findings Gait not checked due to patient safety concerns  Vitals:   09/25/24 1949 09/26/24 0322 09/26/24 0814 09/26/24 1050  BP: (!) 133/49 (!) 116/53 (!) 120/54 (!) 134/54  Pulse: 67 65 65   Resp: 20 16 17    Temp: 98.5 F (36.9 C) 98 F (36.7 C) 98.1 F (36.7 C)   TempSrc:   Oral   SpO2: 97% 94% 95%   Weight:      Height:        Intake/Output Summary (Last 24 hours) at 09/26/2024 1402 Last data filed at 09/26/2024 1108 Gross per 24 hour  Intake 916 ml  Output --  Net 916 ml   Filed Weights   09/21/24 1707  Weight: 60.2 kg    Data Reviewed: I have personally reviewed and interpreted daily labs, tele strips, imagings as discussed above. I reviewed all nursing notes, pharmacy notes, vitals, pertinent old records I have discussed plan of care as described above with RN and patient/family.  CBC: Recent Labs  Lab 09/23/24 0341 09/23/24 1432 09/24/24 0554 09/25/24 0520 09/26/24 0331  WBC 3.9* 4.1 2.0* 1.8* 1.3*  HGB 9.9* 10.1* 9.6* 10.0* 8.5*  HCT 29.5* 30.9* 29.1* 30.4* 26.3*  MCV 77.8* 80.1 79.7* 80.0 81.2  PLT 37* 45* 44* 47* 56*   Basic Metabolic Panel: Recent Labs  Lab 09/22/24 0423 09/23/24 0341 09/24/24 0554 09/25/24 0520 09/26/24 0331  NA 133* 133* 135 135 137  K 3.5 3.2* 4.1 3.9 3.8  CL 99 98 104 99 103  CO2 24 24 25 24 25   GLUCOSE 89 98 99 101* 93  BUN 20 20 13 10 10   CREATININE 0.63 0.60* 0.56* 0.70 0.49*  CALCIUM  8.5* 8.0* 8.1* 8.2* 8.2*  MG 1.6* 2.0 2.0 1.8  --   PHOS 2.6 3.0 2.9  2.3*  --     Studies: No results found.   Scheduled Meds:  apixaban  2.5 mg Oral BID   diltiazem   120 mg Oral Daily   flecainide   50 mg Oral BID   gabapentin   100 mg Oral Q1200   And   gabapentin   100 mg Oral QPC lunch   And   gabapentin   300 mg Oral QHS   losartan   25 mg Oral BID   pantoprazole   80 mg Oral Daily   saccharomyces boulardii  250 mg Oral BID   Continuous Infusions:  piperacillin-tazobactam (ZOSYN)  IV 3.375 g (09/26/24 1119)   PRN Meds: acetaminophen  **OR** acetaminophen , diclofenac Sodium, morphine  injection, ondansetron  **OR** ondansetron  (ZOFRAN ) IV  Time spent: 40 minutes  Author: ELVAN SOR. MD [facility] 09/26/2024 2:02 PM  To reach On-call, see care teams to locate the attending and reach out to them via [email or web address]. If 7PM-7AM, please contact night-coverage If you still have difficulty reaching the attending provider, please page the [facility] (Director on Call) for [facility] on amion for assistance.

## 2024-09-26 NOTE — Progress Notes (Signed)
 Occupational Therapy Treatment Patient Details Name: Ian Sanders MRN: [id number] DOB: [birth year] Today's Date: 09/26/2024   History of present illness Pt is an 83 y.o. male who presents to the ED due to 1 day onset of abdominal pain in the upper abdomen associated with nausea and generalized weakness with difficulty ambulating and 2 falls. Current MD assessment: acute colitis, portal vein thrombosis. PMH of hypertension, TIA, stroke, GERD, neuropathy, paroxysmal atrial fibrillation not on anticoagulation, cirrhosis requiring thrombocytopenia   OT comments  Patient seen for OT treatment on this date. Upon arrival to room patient resting in bed, agreeable to treatment. Patient transitioned to seated EOB without physical A. OT discussing home set up and concerns for discharging home, patient reluctant to engage with OT, was agreeable to demonstrate LB dressing while OT provided suggestions for fall prevention and energy conservation; OT recommending hand held shower head holder, patient agreeable. Patient refused any/all other suggestions, when OT proposed walker tray patient responded did you go to college and get a big degree to learn about that? OT asked if patient wanted any further assist with dc planning from OT perspective, patient declined. Patient returned to bed.  Patient ended treatment in bed with bed/chair alarm on and all needs within reach. Patient making fair progress toward goals, will continue to follow POC. Discharge recommendation remains appropriate.        If plan is discharge home, recommend the following:  A little help with walking and/or transfers;A little help with bathing/dressing/bathroom;Assistance with cooking/housework;Assist for transportation;Help with stairs or ramp for entrance   Equipment Recommendations  None recommended by OT    Recommendations for Other Services      Precautions / Restrictions Precautions Precautions: Fall Recall of  Precautions/Restrictions: Intact Restrictions Weight Bearing Restrictions Per Provider Order: No       Mobility Bed Mobility Overal bed mobility: Needs Assistance Bed Mobility: Supine to Sit, Sit to Supine     Supine to sit: Supervision Sit to supine: Supervision   General bed mobility comments: requires extra time/effort    Transfers                         Balance Overall balance assessment: No apparent balance deficits (not formally assessed) Sitting-balance support: Feet supported, Feet unsupported Sitting balance-Leahy Scale: Good                                     ADL either performed or assessed with clinical judgement   ADL Overall ADL's : Needs assistance/impaired                     Lower Body Dressing: Sitting/lateral leans;Supervision/safety Lower Body Dressing Details (indicate cue type and reason): donned socks while sitting EOB                    Extremity/Trunk Assessment              Vision       Perception     Praxis     Communication Communication Communication: Impaired Factors Affecting Communication: Hearing impaired   Cognition Arousal: Alert Behavior During Therapy: WFL for tasks assessed/performed Cognition: No family/caregiver present to determine baseline                               Following  commands: Intact        Cueing   Cueing Techniques: Verbal cues  Exercises      Shoulder Instructions       General Comments      Pertinent Vitals/ Pain       Pain Assessment Pain Assessment: No/denies pain  Home Living                                          Prior Functioning/Environment              Frequency  Min 2X/week        Progress Toward Goals  OT Goals(current goals can now be found in the care plan section)  Progress towards OT goals: Progressing toward goals  Acute Rehab OT Goals Patient Stated Goal: to go home OT  Goal Formulation: With patient Time For Goal Achievement: 10/07/24 Potential to Achieve Goals: Good ADL Goals Pt Will Perform Lower Body Dressing: with modified independence;sitting/lateral leans;sit to/from stand Pt Will Transfer to Toilet: with modified independence;ambulating Pt Will Perform Toileting - Clothing Manipulation and hygiene: with modified independence;sitting/lateral leans;sit to/from stand Additional ADL Goal #1: Pt will verbalize plan to implement at least 1 learned falls prevention strategy to maximize safety/indep with ADL and mobility.  Plan      Co-evaluation                 AM-PAC OT 6 Clicks Daily Activity     Outcome Measure   Help from another person eating meals?: None Help from another person taking care of personal grooming?: None Help from another person toileting, which includes using toliet, bedpan, or urinal?: A Little Help from another person bathing (including washing, rinsing, drying)?: A Little Help from another person to put on and taking off regular upper body clothing?: None Help from another person to put on and taking off regular lower body clothing?: A Little 6 Click Score: 21    End of Session    OT Visit Diagnosis: Unsteadiness on feet (R26.81);Repeated falls (R29.6);Muscle weakness (generalized) (M62.81)   Activity Tolerance Patient tolerated treatment well   Patient Left in bed;with call bell/phone within reach;with bed alarm set   Nurse Communication          Time: 8863-8848 OT Time Calculation (min): 15 min  Charges: OT General Charges $OT Visit: 1 Visit OT Treatments $Self Care/Home Management : 8-22 mins  Rogers Clause, OT/L MSOT, 09/26/2024

## 2024-09-26 NOTE — Plan of Care (Signed)
  Problem: Education: Goal: Knowledge of General Education information will improve Description: Including pain rating scale, medication(s)/side effects and non-pharmacologic comfort measures 09/26/2024 1817 by Leonce Norene BIRCH, RN Outcome: Progressing 09/26/2024 1614 by Leonce Norene BIRCH, RN Outcome: Progressing   Problem: Health Behavior/Discharge Planning: Goal: Ability to manage health-related needs will improve 09/26/2024 1817 by Leonce Norene BIRCH, RN Outcome: Progressing 09/26/2024 1614 by Leonce Norene D, RN Outcome: Progressing   Problem: Clinical Measurements: Goal: Ability to maintain clinical measurements within normal limits will improve 09/26/2024 1817 by Leonce Norene BIRCH, RN Outcome: Progressing 09/26/2024 1614 by Leonce Norene D, RN Outcome: Progressing Goal: Will remain free from infection 09/26/2024 1817 by Leonce Norene D, RN Outcome: Progressing 09/26/2024 1614 by Leonce Norene D, RN Outcome: Progressing Goal: Diagnostic test results will improve 09/26/2024 1817 by Leonce Norene BIRCH, RN Outcome: Progressing 09/26/2024 1614 by Leonce Norene D, RN Outcome: Progressing Goal: Respiratory complications will improve 09/26/2024 1817 by Leonce Norene D, RN Outcome: Progressing 09/26/2024 1614 by Leonce Norene D, RN Outcome: Progressing Goal: Cardiovascular complication will be avoided 09/26/2024 1817 by Leonce Norene D, RN Outcome: Progressing 09/26/2024 1614 by Leonce Norene BIRCH, RN Outcome: Progressing   Problem: Activity: Goal: Risk for activity intolerance will decrease 09/26/2024 1817 by Leonce Norene D, RN Outcome: Progressing 09/26/2024 1614 by Leonce Norene D, RN Outcome: Progressing   Problem: Nutrition: Goal: Adequate nutrition will be maintained 09/26/2024 1817 by Leonce Norene BIRCH, RN Outcome: Progressing 09/26/2024 1614 by Leonce Norene D, RN Outcome: Progressing   Problem: Coping: Goal: Level of anxiety will decrease 09/26/2024  1817 by Leonce Norene D, RN Outcome: Progressing 09/26/2024 1614 by Leonce Norene D, RN Outcome: Progressing   Problem: Elimination: Goal: Will not experience complications related to bowel motility 09/26/2024 1817 by Leonce Norene BIRCH, RN Outcome: Progressing 09/26/2024 1614 by Leonce Norene D, RN Outcome: Progressing Goal: Will not experience complications related to urinary retention 09/26/2024 1817 by Leonce Norene BIRCH, RN Outcome: Progressing 09/26/2024 1614 by Leonce Norene D, RN Outcome: Progressing   Problem: Pain Managment: Goal: General experience of comfort will improve and/or be controlled 09/26/2024 1817 by Leonce Norene BIRCH, RN Outcome: Progressing 09/26/2024 1614 by Leonce Norene D, RN Outcome: Progressing   Problem: Safety: Goal: Ability to remain free from injury will improve 09/26/2024 1817 by Leonce Norene BIRCH, RN Outcome: Progressing 09/26/2024 1614 by Leonce Norene D, RN Outcome: Progressing   Problem: Skin Integrity: Goal: Risk for impaired skin integrity will decrease 09/26/2024 1817 by Leonce Norene D, RN Outcome: Progressing 09/26/2024 1614 by Leonce Norene BIRCH, RN Outcome: Progressing

## 2024-09-26 NOTE — Progress Notes (Signed)
 Mobility Specialist Progress Note:    09/26/24 0826  Mobility  Activity Ambulated with assistance;Pivoted/transferred from bed to chair  Level of Assistance Standby assist, set-up cues, supervision of patient - no hands on  Assistive Device Front wheel walker  Distance Ambulated (ft) 25 ft  Range of Motion/Exercises Active;All extremities  Activity Response Tolerated well  Mobility visit 1 Mobility  Mobility Specialist Start Time (ACUTE ONLY) 0810  Mobility Specialist Stop Time (ACUTE ONLY) 0826  Mobility Specialist Time Calculation (min) (ACUTE ONLY) 16 min   Pt received in bed, agreeable to mobility. Required SBA to stand and ambulate with RW. This author assisted pt with washing face and changing brief. Tolerated well, asx throughout. Left pt in chair with alarm on and belongings in reach, all needs met.  Sherrilee Ditty Mobility Specialist Please contact via Special Educational Needs Teacher or  Rehab office at [phone]

## 2024-09-26 NOTE — Plan of Care (Signed)

## 2024-09-26 NOTE — TOC Progression Note (Signed)
 Transition of Care [facility]) - Progression Note    Patient Details  Name: Ian Sanders MRN: [id number] Date of Birth: 30-Dec-1940  [facility]) CM/SW Contact  [name], RN Phone Number: 09/26/2024, 11:31 AM  Clinical Narrative:    Met with patient at bedside.  CMS Medicare.gov Compare Post Acute Care list reviewed with patient.  Patient selects [facility].  Accepted in HUB and notified Darleene with [facility]       Per patient daughter in law Delon to transport at discharge                Expected Discharge Plan and Services                                               Social Drivers of Health (SDOH) Interventions SDOH Screenings   Food Insecurity: Food Insecurity Present (09/22/2024)  Housing: Low Risk  (09/22/2024)  Transportation Needs: No Transportation Needs (09/22/2024)  Utilities: Not At Risk (09/22/2024)  Alcohol  Screen: Low Risk  (01/18/2024)  Depression (PHQ2-9): Medium Risk (06/23/2024)  Financial Resource Strain: Low Risk  (01/18/2024)  Physical Activity: Insufficiently Active (01/18/2024)  Social Connections: Moderately Isolated (09/22/2024)  Stress: No Stress Concern Present (01/18/2024)  Tobacco Use: High Risk (09/22/2024)  Health Literacy: Adequate Health Literacy (01/18/2024)    Readmission Risk Interventions     No data to display

## 2024-09-26 NOTE — Progress Notes (Signed)
 I have reviewed and concur with this student's charting and [facility] documentation  Charmaine LITTIE Reiter, RN 09/26/2024 1:27 PM

## 2024-09-27 ENCOUNTER — Telehealth: Payer: Self-pay

## 2024-09-27 ENCOUNTER — Other Ambulatory Visit: Payer: Self-pay

## 2024-09-27 DIAGNOSIS — K529 Noninfective gastroenteritis and colitis, unspecified: Secondary | ICD-10-CM | POA: Diagnosis not present

## 2024-09-27 LAB — CBC
HCT: 27.5 % — ABNORMAL LOW (ref 39.0–52.0)
Hemoglobin: 8.8 g/dL — ABNORMAL LOW (ref 13.0–17.0)
MCH: 25.7 pg — ABNORMAL LOW (ref 26.0–34.0)
MCHC: 32 g/dL (ref 30.0–36.0)
MCV: 80.4 fL (ref 80.0–100.0)
Platelets: 63 K/uL — ABNORMAL LOW (ref 150–400)
RBC: 3.42 MIL/uL — ABNORMAL LOW (ref 4.22–5.81)
RDW: 13.2 % (ref 11.5–15.5)
WBC: 1.8 K/uL — ABNORMAL LOW (ref 4.0–10.5)
nRBC: 0 % (ref 0.0–0.2)

## 2024-09-27 LAB — BASIC METABOLIC PANEL WITH GFR
Anion gap: 11 (ref 5–15)
BUN: 9 mg/dL (ref 8–23)
CO2: 24 mmol/L (ref 22–32)
Calcium: 8.5 mg/dL — ABNORMAL LOW (ref 8.9–10.3)
Chloride: 101 mmol/L (ref 98–111)
Creatinine, Ser: 0.61 mg/dL (ref 0.61–1.24)
GFR, Estimated: >60 mL/min (ref >60)
Glucose, Bld: 94 mg/dL (ref 70–99)
Potassium: 4.1 mmol/L (ref 3.5–5.1)
Sodium: 136 mmol/L (ref 135–145)

## 2024-09-27 LAB — CULTURE, BLOOD (ROUTINE X 2)
Culture: NO GROWTH
Culture: NO GROWTH

## 2024-09-27 MED ORDER — CIPROFLOXACIN HCL 500 MG PO TABS
500.0000 mg | ORAL_TABLET | Freq: Two times a day (BID) | ORAL | 0 refills | Status: AC
  Filled 2024-09-27: qty 10, 5d supply, fill #0

## 2024-09-27 MED ORDER — SACCHAROMYCES BOULARDII 250 MG PO CAPS
250.0000 mg | ORAL_CAPSULE | Freq: Two times a day (BID) | ORAL | 0 refills | Status: AC
  Filled 2024-09-27: qty 20, 10d supply, fill #0

## 2024-09-27 MED ORDER — APIXABAN 2.5 MG PO TABS
2.5000 mg | ORAL_TABLET | Freq: Two times a day (BID) | ORAL | 5 refills | Status: AC
  Filled 2024-09-27 – 2024-10-28 (×2): qty 60, 30d supply, fill #0
  Filled 2024-10-28 – 2024-12-01 (×2): qty 60, 30d supply, fill #1

## 2024-09-27 NOTE — Telephone Encounter (Signed)
 Ok for home health skilled nursing.

## 2024-09-27 NOTE — TOC Transition Note (Signed)
 Transition of Care [facility]) - Discharge Note   Patient Details  Name: Ian Sanders MRN: [id number] Date of Birth: 06-16-1941  [facility]) CM/SW Contact:  [name], RN Phone Number: 09/27/2024, 11:05 AM   Clinical Narrative:     Patient to discharge today Darleene with [facility] notified of discharge.  Per Conversation with patient yesterday daughter in law Delon to transport at discharge         Patient Goals and CMS Choice            Discharge Placement                       Discharge Plan and Services Additional resources added to the After Visit Summary for                                       Social Drivers of Health (SDOH) Interventions SDOH Screenings   Food Insecurity: Food Insecurity Present (09/22/2024)  Housing: Low Risk  (09/22/2024)  Transportation Needs: No Transportation Needs (09/22/2024)  Utilities: Not At Risk (09/22/2024)  Alcohol  Screen: Low Risk  (01/18/2024)  Depression (PHQ2-9): Medium Risk (06/23/2024)  Financial Resource Strain: Low Risk  (01/18/2024)  Physical Activity: Insufficiently Active (01/18/2024)  Social Connections: Moderately Isolated (09/22/2024)  Stress: No Stress Concern Present (01/18/2024)  Tobacco Use: High Risk (09/22/2024)  Health Literacy: Adequate Health Literacy (01/18/2024)     Readmission Risk Interventions    09/27/2024    7:40 AM  Readmission Risk Prevention Plan  Transportation Screening Complete  PCP or Specialist Appt within 3-5 Days Complete  HRI or Home Care Consult Complete  Palliative Care Screening Not Applicable  Medication Review (RN Care Manager) Complete

## 2024-09-27 NOTE — Discharge Summary (Signed)
 [facility] Discharge Summary   Patient: Ian Sanders FMW:[id number]  PCP: Ian Nancyann BRAVO, MD  Date of admission: 09/21/2024   Date of discharge:  09/27/2024     Discharge Diagnoses:  Principal Problem:   Colitis   Admitted From: Home Disposition:  Home with [facility]  Recommendations for Outpatient Follow-up:  F/u with PCP in 1 wk F/u with Heme/onc in 1-2 weeks Follow up LABS/TEST:  Repeat CBC, BMP and LFTs in 1-2 weeks    Contact information for follow-up providers     Ian Nancyann BRAVO, MD Follow up in 1 week(s).   Specialty: Family Medicine Contact information: [address] [place] [address] [place] [address] [phone]         Ian Cindy SAUNDERS, MD Follow up in 1 week(s).   Specialties: Internal Medicine, Oncology Contact information: [address] [place] [address] [phone]              Contact information for after-discharge care     Home Medical Care     [facility]) .   Service: Home Health Services Contact information: [address] [place]  [address] [phone]                    Diet recommendation: Cardiac diet  Activity: The patient is advised to gradually reintroduce usual activities, as tolerated  Discharge Condition: stable  Code Status: Full code   History of present illness: As per the H and P dictated on admission.  Hospital Course:  Ian Sanders is a 83 y.o. male with PMH of HTN, TIA, stroke, paroxysmal A.Fib not on anticoagulation, cirrhosis, chronic thrombocytopenia who presents to the emergency department due to 1 day onset of abdominal pain in the upper abdomen.  Pain was described as sharp, intermittent and 10/10 on pain scale.  This was associated with nausea without vomiting and generalized weakness with difficulty being able to ambulate with a walker.   ED course Hemodynamically stable.  CBD: WBC 16.4, hematocrit 30.9, MCV 77.8,  platelets 71.  BMP: sodium 132 rest wnl, blood glucose of 116.  Urinalysis was unimpressive for UTI, lactic acid was normal, troponin x 2 was normal.  Influenza A, B, SARS coronavirus 2, RSV was negative. CT a/p: large amount of portal vein and splenic vein thrombus.  Findings consistent with colitis involving the cecum and ascending colon He was treated with Zosyn, and IV hydration fluids.     Assessment and Plan:   # Acute colitis secondary to E. coli S/p IV hydration, S/p IV Zosyn given in the ED, which was continued during hospital stay. S/p prn meds for pain blood culture NGTD. Started probiotic 11/9 diet FL >soft   Patient developed diarrhea, tested positive for E. coli.  Diarrhea is gradually improving, patient is tolerating diet well, no nausea vomiting.  Patient seems stable to discharge, transition to oral antibiotics ciprofloxacin  500 mg p.o. twice daily for 5 days.  Continue oral hydration.   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  Anticoagulation was held on admission due to chronic thrombocytopenia. Heme-onc consulted, recommended to start heparin  IV infusion without bolus and then transition to Eliquis 2.5 mg p.o. twice daily if no bleeding.  D/w Vascular surgery, if recommended no any other option then anticoagulation per 11/10 DC'd heparin , started Eliquis 2.5 mg p.o. twice daily.  11/12 patient seems stable, no bleeding, H&H and Plt  counts are stable    # Paroxysmal atrial fibrillation EKG personally reviewed showed normal sinus rhythm Patient was not on any anticoagulation Continued home meds Cardizem  and flecainide  Advised to follow-up with cardiology as an outpatient Started Eliquis 2.5 mg p.o. twice daily due to portal vein thrombus.   # Essential hypertension: continued Cardizem  and losartan  # Chronic neuropathy: Continue gabapentin  home dose # GERD: Continue Protonix    # Cirrhosis with chronic  thrombocytopenia Platelets 71, no signs of active bleeding Plts 42>37>45>44>47>56> 63   # Pancytopenia, stable, follow with hematology as an outpatient. # Hyponatremia, resolved # Hypokalemia, potassium repleted.  Resolved # Hypomagnesemia, mag repleted.  Resolved # Hypophosphatemia, Phos repleted.  Resolved  Body mass index is 19.6 kg/m.  Nutrition Interventions:  Patient was seen by physical therapy, who recommended Home health, which was arranged. On the day of the discharge the patient's vitals were stable, and no other acute medical condition were reported by patient. the patient was felt safe to be discharge at Home with Home health.  Consultants: Heme-onc Procedures: None  Discharge Exam: General: Appear in no distress, Oral Mucosa Clear, moist. Cardiovascular: S1 and S2 Present, no Murmur, Respiratory: normal respiratory effort, Bilateral Air entry present and no Crackles, no wheezes Abdomen: BS present, Soft, mild distention, and mild lower abdominal tenderness. Extremities: no Pedal edema, no calf tenderness Neurology: alert and oriented to time, place, and person affect appropriate.  Filed Weights   09/21/24 1707  Weight: 60.2 kg   Vitals:   09/26/24 1946 09/27/24 0737  BP: (!) 132/53 (!) 130/59  Pulse: 70 60  Resp: 18 16  Temp: 98.5 F (36.9 C) 98.8 F (37.1 C)  SpO2: 98% 98%    DISCHARGE MEDICATION: Allergies as of 09/27/2024       Reactions   Lisinopril Swelling   Tongue swelling   Myrbetriq  [mirabegron ] Swelling   Of the tongue   Beta Adrenergic Blockers    Other Reaction(s): Other (See Comments) Symptomatic bradycardia Patient has been tolerating Propranolol  10 mg twice daily since 10/2022, with no complaints   Other Other (See Comments)   Symptomatic bradycardia    Triamterene    Other reaction(s): ITCHING,WATERING EYES Other Reaction(s): ITCHING,WATERING EYES   Amlodipine Other (See Comments)   Peripheral edema   Atorvastatin Other (See  Comments)   Dizziness and Fatigue   Dabigatran Etexilate Mesylate Other (See Comments)   Stomach ulcers (Pradaxa)   Dabigatran Etexilate Mesylate Other (See Comments)   Stomach ulcers (Pradaxa)   Latex Rash        Medication List     STOP taking these medications    cefUROXime 250 MG tablet Commonly known as: CEFTIN   cyclobenzaprine  5 MG tablet Commonly known as: FLEXERIL    trospium  20 MG tablet Commonly known as: SANCTURA        TAKE these medications    apixaban 2.5 MG Tabs tablet Commonly known as: ELIQUIS Take 1 tablet (2.5 mg total) by mouth 2 (two) times daily.   artificial tears ophthalmic solution 1 drop as needed.   calcium -vitamin D  250-125 MG-UNIT tablet Commonly known as: OSCAL Take 1 tablet by mouth 2 (two) times daily.   cetirizine 10 MG tablet Commonly known as: ZYRTEC Take 10 mg by mouth daily as needed for allergies.   ciprofloxacin  500 MG tablet Commonly known as: Cipro  Take 1 tablet (500 mg total) by mouth 2 (two) times daily for 5 days.   cyanocobalamin  1000 MCG tablet Take 1 tablet by mouth daily.  diltiazem  120 MG 24 hr capsule Commonly known as: CARDIZEM  CD Take 120 mg by mouth daily.   Ferrous Sulfate  134 MG Tabs Take 1 tablet by mouth daily.   flecainide  50 MG tablet Commonly known as: TAMBOCOR  Take 1 tablet (50 mg total) by mouth 2 (two) times daily.   gabapentin  100 MG capsule Commonly known as: NEURONTIN  Take one in the morning, one in the afternoon, and three at night   Gemtesa  75 MG Tabs Generic drug: Vibegron  Take 1 tablet (75 mg total) by mouth daily.   leuprolide  (6 Month) 45 MG injection Commonly known as: LUPRON  Inject 45 mg into the muscle every 6 (six) months.   losartan  25 MG tablet Commonly known as: COZAAR  Take 1 tablet by mouth 2 (two) times daily.   meclizine  25 MG tablet Commonly known as: ANTIVERT  Take 1 tablet by mouth daily.   omeprazole  40 MG capsule Commonly known as: PRILOSEC Take 40  mg by mouth daily.   propranolol  10 MG tablet Commonly known as: INDERAL  Take 10 mg by mouth 2 (two) times daily.   saccharomyces boulardii 250 MG capsule Commonly known as: FLORASTOR Take 1 capsule (250 mg total) by mouth 2 (two) times daily for 7 days.   zolpidem  10 MG tablet Commonly known as: AMBIEN  TAKE ONE-HALF TO ONE TABLET AT BEDTIME.       Allergies  Allergen Reactions   Lisinopril Swelling    Tongue swelling   Myrbetriq  [Mirabegron ] Swelling    Of the tongue   Beta Adrenergic Blockers     Other Reaction(s): Other (See Comments)  Symptomatic bradycardia Patient has been tolerating Propranolol  10 mg twice daily since 10/2022, with no complaints   Other Other (See Comments)    Symptomatic bradycardia    Triamterene     Other reaction(s): ITCHING,WATERING EYES  Other Reaction(s): ITCHING,WATERING EYES   Amlodipine Other (See Comments)    Peripheral edema   Atorvastatin Other (See Comments)    Dizziness and Fatigue   Dabigatran Etexilate Mesylate Other (See Comments)    Stomach ulcers (Pradaxa)   Dabigatran Etexilate Mesylate Other (See Comments)    Stomach ulcers (Pradaxa)   Latex Rash   Discharge Instructions     Call MD for:   Complete by: As directed    GI bleeding   Call MD for:  difficulty breathing, headache or visual disturbances   Complete by: As directed    Call MD for:  extreme fatigue   Complete by: As directed    Call MD for:  persistant dizziness or light-headedness   Complete by: As directed    Call MD for:  persistant nausea and vomiting   Complete by: As directed    Call MD for:  severe uncontrolled pain   Complete by: As directed    Call MD for:  temperature >100.4   Complete by: As directed    Diet - low sodium heart healthy   Complete by: As directed    Discharge instructions   Complete by: As directed    F/u with PCP in 1 wk F/u with Heme/onc in 1-2 weeks Repeat CBC, BMP and LFTs in 1-2 weeks   Increase activity slowly    Complete by: As directed        The results of significant diagnostics from this hospitalization (including imaging, microbiology, ancillary and laboratory) are listed below for reference.    Significant Diagnostic Studies: CT ABDOMEN PELVIS W CONTRAST Result Date: 09/21/2024 CLINICAL DATA:  Left lower quadrant  pain. EXAM: CT ABDOMEN AND PELVIS WITH CONTRAST TECHNIQUE: Multidetector CT imaging of the abdomen and pelvis was performed using the standard protocol following bolus administration of intravenous contrast. RADIATION DOSE REDUCTION: This exam was performed according to the departmental dose-optimization program which includes automated exposure control, adjustment of the mA and/or kV according to patient size and/or use of iterative reconstruction technique. CONTRAST:  OMNIPAQUE  IOHEXOL  300 MG/ML  SOLN COMPARISON:  February 26, 2021 FINDINGS: Lower chest: Moderate severity scarring and/or atelectasis is seen within the bilateral lower lobes. Hepatobiliary: No focal liver abnormality is seen. Numerous subcentimeter gallstones are seen within the lumen of a moderately distended gallbladder. There is no evidence of gallbladder wall thickening, or biliary dilatation. Pancreas: Unremarkable. No pancreatic ductal dilatation or surrounding inflammatory changes. Spleen: Small, stable, ill-defined areas of parenchymal low attenuation are seen scattered throughout a markedly enlarged spleen (the largest measures approximately 11 mm). Adrenals/Urinary Tract: Adrenal glands are unremarkable. Kidneys are normal in size, without renal calculi or hydronephrosis. Multiple bilateral simple renal cysts are seen. Bladder is unremarkable. Stomach/Bowel: Stomach is within normal limits. Appendix appears normal. No evidence of bowel dilatation. The walls of the cecum and ascending colon are thickened and edematous in appearance. A moderate amount of inflammatory fat stranding and a small amount of fluid are seen  along the adjacent portions of the right paracolic gutter and lateral aspect of the mid to lower right abdomen. Vascular/Lymphatic: Aortic atherosclerosis. A large amount of proximal portal vein thrombus is seen. This extends into the distal aspect of the splenic vein. (Axial CT images 32 through 35, CT series 2). No enlarged abdominal or pelvic lymph nodes. Reproductive: The prostate gland is surgically absent. Other: There is a 2.1 cm x 3.3 cm fluid-filled left inguinal hernia. A mild amount of perihepatic and perisplenic fluid is seen. A small amount of pelvic free fluid is present on the right. Musculoskeletal: A chronic compression fracture deformity is seen at the level of L1, with multilevel degenerative changes present throughout the lumbar spine. IMPRESSION: 1. 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 2. Cholelithiasis. 3. Findings consistent with colitis involving the cecum and ascending colon. 4. Small amount of pelvic free fluid. 5. Multiple bilateral simple renal cysts. 6. Chronic compression fracture deformity at the level of L1. 7. Aortic atherosclerosis. Electronically Signed   By: Suzen Dials M.D.   On: 09/21/2024 22:11    Microbiology: Recent Results (from the past 240 hours)  Resp panel by RT-PCR (RSV, Flu A&B, Covid) Anterior Nasal Swab     Status: None   Collection Time: 09/21/24  9:08 PM   Specimen: Anterior Nasal Swab  Result Value Ref Range Status   SARS Coronavirus 2 by RT PCR NEGATIVE NEGATIVE Final    Comment: (NOTE) SARS-CoV-2 target nucleic acids are NOT DETECTED.  The SARS-CoV-2 RNA is generally detectable in upper respiratory specimens during the acute phase of infection. The lowest concentration of SARS-CoV-2 viral copies this assay can detect is 138 copies/mL. A negative result does not preclude SARS-Cov-2 infection and should not be used as the sole basis for treatment or other patient management decisions. A negative result may occur with   improper specimen collection/handling, submission of specimen other than nasopharyngeal swab, presence of viral mutation(s) within the areas targeted by this assay, and inadequate number of viral copies(<138 copies/mL). A negative result must be combined with clinical observations, patient history, and epidemiological information. The expected result is Negative.  Fact Sheet for Patients:  bloggercourse.com  Fact Sheet for Healthcare Providers:  seriousbroker.it  This test is no t yet approved or cleared by the [place]  FDA and  has been authorized for detection and/or diagnosis of SARS-CoV-2 by FDA under an Emergency Use Authorization (EUA). This EUA will remain  in effect (meaning this test can be used) for the duration of the COVID-19 declaration under Section 564(b)(1) of the Act, 21 U.S.C.section 360bbb-3(b)(1), unless the authorization is terminated  or revoked sooner.       Influenza A by PCR NEGATIVE NEGATIVE Final   Influenza B by PCR NEGATIVE NEGATIVE Final    Comment: (NOTE) The Xpert Xpress SARS-CoV-2/FLU/RSV plus assay is intended as an aid in the diagnosis of influenza from Nasopharyngeal swab specimens and should not be used as a sole basis for treatment. Nasal washings and aspirates are unacceptable for Xpert Xpress SARS-CoV-2/FLU/RSV testing.  Fact Sheet for Patients: bloggercourse.com  Fact Sheet for Healthcare Providers: seriousbroker.it  This test is not yet approved or cleared by the [place]  FDA and has been authorized for detection and/or diagnosis of SARS-CoV-2 by FDA under an Emergency Use Authorization (EUA). This EUA will remain in effect (meaning this test can be used) for the duration of the COVID-19 declaration under Section 564(b)(1) of the Act, 21 U.S.C. section 360bbb-3(b)(1), unless the authorization is terminated or revoked.      Resp Syncytial Virus by PCR NEGATIVE NEGATIVE Final    Comment: (NOTE) Fact Sheet for Patients: bloggercourse.com  Fact Sheet for Healthcare Providers: seriousbroker.it  This test is not yet approved or cleared by the [place]  FDA and has been authorized for detection and/or diagnosis of SARS-CoV-2 by FDA under an Emergency Use Authorization (EUA). This EUA will remain in effect (meaning this test can be used) for the duration of the COVID-19 declaration under Section 564(b)(1) of the Act, 21 U.S.C. section 360bbb-3(b)(1), unless the authorization is terminated or revoked.  Performed at [facility], [address] [place], [place] [address]   Culture, blood (Routine X 2) w Reflex to ID Panel     Status: None   Collection Time: 09/22/24  4:23 AM   Specimen: BLOOD  Result Value Ref Range Status   Specimen Description BLOOD BLOOD LEFT ARM  Final   Special Requests   Final    BOTTLES DRAWN AEROBIC AND ANAEROBIC Blood Culture results may not be optimal due to an inadequate volume of blood received in culture bottles   Culture   Final    NO GROWTH 5 DAYS Performed at [facility], [address] [place], [place] [address]    Report Status 09/27/2024 FINAL  Final  Culture, blood (Routine X 2) w Reflex to ID Panel     Status: None   Collection Time: 09/22/24  4:23 AM   Specimen: BLOOD  Result Value Ref Range Status   Specimen Description BLOOD BLOOD RIGHT ARM  Final   Special Requests   Final    BOTTLES DRAWN AEROBIC AND ANAEROBIC Blood Culture results may not be optimal due to an inadequate volume of blood received in culture bottles   Culture   Final    NO GROWTH 5 DAYS Performed at [facility], [address] [place], [place] [address]    Report Status 09/27/2024 FINAL  Final  Gastrointestinal Panel by PCR , Stool     Status: Abnormal   Collection Time: 09/25/24 12:02 PM   Specimen:  Stool  Result Value Ref Range Status  Campylobacter species NOT DETECTED NOT DETECTED Final   Plesimonas shigelloides NOT DETECTED NOT DETECTED Final   Salmonella species NOT DETECTED NOT DETECTED Final   Yersinia enterocolitica NOT DETECTED NOT DETECTED Final   Vibrio species NOT DETECTED NOT DETECTED Final   Vibrio cholerae NOT DETECTED NOT DETECTED Final   Enteroaggregative E coli (EAEC) NOT DETECTED NOT DETECTED Final   Enteropathogenic E coli (EPEC) DETECTED (A) NOT DETECTED Final    Comment: RESULT CALLED TO, READ BACK BY AND VERIFIED WITH: KRISTEN DAVIS 09/25/2024 @1535  KC    Enterotoxigenic E coli (ETEC) NOT DETECTED NOT DETECTED Final   Shiga like toxin producing E coli (STEC) NOT DETECTED NOT DETECTED Final   Shigella/Enteroinvasive E coli (EIEC) NOT DETECTED NOT DETECTED Final   Cryptosporidium NOT DETECTED NOT DETECTED Final   Cyclospora cayetanensis NOT DETECTED NOT DETECTED Final   Entamoeba histolytica NOT DETECTED NOT DETECTED Final   Giardia lamblia NOT DETECTED NOT DETECTED Final   Adenovirus F40/41 NOT DETECTED NOT DETECTED Final   Astrovirus NOT DETECTED NOT DETECTED Final   Norovirus GI/GII NOT DETECTED NOT DETECTED Final   Rotavirus A NOT DETECTED NOT DETECTED Final   Sapovirus (I, II, IV, and V) NOT DETECTED NOT DETECTED Final    Comment: Performed at [facility], [address] [place], [place] [address]     Labs: CBC: Recent Labs  Lab 09/23/24 1432 09/24/24 0554 09/25/24 0520 09/26/24 0331 09/27/24 0330  WBC 4.1 2.0* 1.8* 1.3* 1.8*  HGB 10.1* 9.6* 10.0* 8.5* 8.8*  HCT 30.9* 29.1* 30.4* 26.3* 27.5*  MCV 80.1 79.7* 80.0 81.2 80.4  PLT 45* 44* 47* 56* 63*   Basic Metabolic Panel: Recent Labs  Lab 09/22/24 0423 09/23/24 0341 09/24/24 0554 09/25/24 0520 09/26/24 0331 09/27/24 0330  NA 133* 133* 135 135 137 136  K 3.5 3.2* 4.1 3.9 3.8 4.1  CL 99 98 104 99 103 101  CO2 24 24 25 24 25 24   GLUCOSE 89 98 99 101* 93 94  BUN 20 20 13  10 10 9   CREATININE 0.63 0.60* 0.56* 0.70 0.49* 0.61  CALCIUM  8.5* 8.0* 8.1* 8.2* 8.2* 8.5*  MG 1.6* 2.0 2.0 1.8  --   --   PHOS 2.6 3.0 2.9 2.3*  --   --    Liver Function Tests: Recent Labs  Lab 09/21/24 1708 09/22/24 0423 09/23/24 0341 09/24/24 0554 09/25/24 0520  AST 27 23 17 17 18   ALT 14 12 9 9 9   ALKPHOS 77 67 63 64 68  BILITOT 2.8* 2.5* 1.3* 1.2 1.0  PROT 7.2 6.5 6.1* 6.2* 6.5  ALBUMIN 3.8 3.2* 3.0* 2.9* 2.9*   Recent Labs  Lab 09/21/24 1708  LIPASE 25   Recent Labs  Lab 09/23/24 0341  AMMONIA 26   Cardiac Enzymes: No results for input(s): CKTOTAL, CKMB, CKMBINDEX, TROPONINI in the last 168 hours. BNP (last 3 results) No results for input(s): BNP in the last 8760 hours. CBG: No results for input(s): GLUCAP in the last 168 hours.  Time spent: 35 minutes  Signed:  Elvan Sor  [facility] 09/27/2024 11:32 AM

## 2024-09-27 NOTE — Plan of Care (Signed)
  Problem: Education: Goal: Knowledge of General Education information will improve Description: Including pain rating scale, medication(s)/side effects and non-pharmacologic comfort measures Outcome: Adequate for Discharge   Problem: Health Behavior/Discharge Planning: Goal: Ability to manage health-related needs will improve Outcome: Adequate for Discharge   Problem: Clinical Measurements: Goal: Ability to maintain clinical measurements within normal limits will improve Outcome: Adequate for Discharge Goal: Will remain free from infection Outcome: Adequate for Discharge Goal: Diagnostic test results will improve Outcome: Adequate for Discharge Goal: Respiratory complications will improve Outcome: Adequate for Discharge Goal: Cardiovascular complication will be avoided Outcome: Adequate for Discharge   Problem: Activity: Goal: Risk for activity intolerance will decrease Outcome: Adequate for Discharge   Problem: Nutrition: Goal: Adequate nutrition will be maintained Outcome: Adequate for Discharge   Problem: Coping: Goal: Level of anxiety will decrease Outcome: Adequate for Discharge   Problem: Elimination: Goal: Will not experience complications related to bowel motility Outcome: Adequate for Discharge Goal: Will not experience complications related to urinary retention Outcome: Adequate for Discharge   Problem: Pain Managment: Goal: General experience of comfort will improve and/or be controlled Outcome: Adequate for Discharge   Problem: Safety: Goal: Ability to remain free from injury will improve Outcome: Adequate for Discharge   Problem: Skin Integrity: Goal: Risk for impaired skin integrity will decrease Outcome: Adequate for Discharge   Problem: Acute Rehab PT Goals(only PT should resolve) Goal: Pt Will Ambulate Outcome: Adequate for Discharge Goal: Pt/caregiver will Perform Home Exercise Program Outcome: Adequate for Discharge

## 2024-09-27 NOTE — TOC Progression Note (Signed)
 Transition of Care [facility]) - Progression Note    Patient Details  Name: Ian Sanders MRN: [id number] Date of Birth: [birth year]  [facility]) CM/SW Contact  [name], RN Phone Number: 09/27/2024, 7:38 AM  Clinical Narrative:     Per MD note anticipated dc today IP Care Management to notify [facility] at time of discharge                     Expected Discharge Plan and Services                                               Social Drivers of Health (SDOH) Interventions SDOH Screenings   Food Insecurity: Food Insecurity Present (09/22/2024)  Housing: Low Risk  (09/22/2024)  Transportation Needs: No Transportation Needs (09/22/2024)  Utilities: Not At Risk (09/22/2024)  Alcohol  Screen: Low Risk  (01/18/2024)  Depression (PHQ2-9): Medium Risk (06/23/2024)  Financial Resource Strain: Low Risk  (01/18/2024)  Physical Activity: Insufficiently Active (01/18/2024)  Social Connections: Moderately Isolated (09/22/2024)  Stress: No Stress Concern Present (01/18/2024)  Tobacco Use: High Risk (09/22/2024)  Health Literacy: Adequate Health Literacy (01/18/2024)    Readmission Risk Interventions     No data to display

## 2024-09-27 NOTE — Telephone Encounter (Signed)
 Copied from CRM [id number]. Topic: Clinical - Home Health Verbal Orders >> Sep 27, 2024  4:19 PM Victoria B wrote: Caller/Agency: Jenna from [facility] Number: [phone]  Service Requested: Skilled Nursing Frequency: caller wants to get to patient's home starting  tomorrow, because patient is discharging today and needs help at home asap Any new concerns about the patient? no

## 2024-09-27 NOTE — Progress Notes (Signed)
 Physical Therapy Treatment Patient Details Name: Ian Sanders MRN: [id number] DOB: Apr 16, 1941 Today's Date: 09/27/2024   History of Present Illness Pt is an 83 y.o. male who presents to the ED due to 1 day onset of abdominal pain in the upper abdomen associated with nausea and generalized weakness with difficulty ambulating and 2 falls. Current MD assessment: acute colitis, portal vein thrombosis. PMH of hypertension, TIA, stroke, GERD, neuropathy, paroxysmal atrial fibrillation not on anticoagulation, cirrhosis requiring thrombocytopenia    PT Comments  Pt received up in chair, daughter at bedside. Potential d/c home today, pt states he feels comfortable with this decision. He has made great progress since initial eval requiring only Supervision at times for all functional mobility. Continues to rely on RW for standing dynamic tasks and gait which is his baseline LOF. Pt appears close to baseline from a PT standpoint and will receive HHPT.   If plan is discharge home, recommend the following: A little help with walking and/or transfers;A little help with bathing/dressing/bathroom;Assistance with cooking/housework;Assist for transportation;Help with stairs or ramp for entrance   Can travel by private vehicle        Equipment Recommendations  None recommended by PT (Pt has a w/c, BSC, RW, and lift chair at home)    Recommendations for Other Services       Precautions / Restrictions Precautions Precautions: Fall Recall of Precautions/Restrictions: Intact Precaution/Restrictions Comments: reports 2-3 recent falls. Restrictions Weight Bearing Restrictions Per Provider Order: No     Mobility  Bed Mobility               General bed mobility comments: NT, pt has an adjustable bed with side rail at home    Transfers Overall transfer level: Needs assistance Equipment used: Rolling walker (2 wheels) Transfers: Sit to/from Stand Sit to Stand: Supervision           General  transfer comment:  (Pt moves cautiously but able to transfer from various surfaces with distant supervision)    Ambulation/Gait Ambulation/Gait assistance: Supervision Gait Distance (Feet):  (30) Assistive device: Rolling walker (2 wheels) Gait Pattern/deviations: Decreased step length - right, Decreased step length - left, Step-through pattern Gait velocity:  (decreased)     General Gait Details: pt ambulates with RW, slight flexed posture, normal base of support,  foot clearance WFL   Stairs Stairs:  (No stairs to enter single level home)           Wheelchair Mobility     Tilt Bed    Modified Rankin (Stroke Patients Only)       Balance Overall balance assessment: No apparent balance deficits (not formally assessed) Sitting-balance support: Feet supported, Feet unsupported Sitting balance-Leahy Scale: Good     Standing balance support: Single extremity supported, During functional activity   Standing balance comment:  (Pt able to stand on one leg tor receive help to don pants with B UE support)                            Communication Communication Communication: Impaired Factors Affecting Communication: Hearing impaired  Cognition Arousal: Alert Behavior During Therapy: WFL for tasks assessed/performed   PT - Cognitive impairments: No apparent impairments                         Following commands: Intact      Cueing Cueing Techniques: Verbal cues  Exercises Other Exercises Other Exercises: Educated  patient on role of PT and recommendations    General Comments General comments (skin integrity, edema, etc.):  (Brief placed on pt for d/c home today due to bowel incontinence)      Pertinent Vitals/Pain Pain Assessment Pain Assessment: No/denies pain    Home Living                          Prior Function            PT Goals (current goals can now be found in the care plan section) Acute Rehab PT Goals Patient  Stated Goal: to get stronger Progress towards PT goals: Progressing toward goals    Frequency    Min 2X/week      PT Plan      Co-evaluation              AM-PAC PT 6 Clicks Mobility   Outcome Measure  Help needed turning from your back to your side while in a flat bed without using bedrails?: A Little Help needed moving from lying on your back to sitting on the side of a flat bed without using bedrails?: A Little Help needed moving to and from a bed to a chair (including a wheelchair)?: A Little Help needed standing up from a chair using your arms (e.g., wheelchair or bedside chair)?: A Little Help needed to walk in hospital room?: A Little Help needed climbing 3-5 steps with a railing? : A Little 6 Click Score: 18    End of Session   Activity Tolerance: Patient tolerated treatment well Patient left: in chair;with family/visitor present;with call bell/phone within reach Nurse Communication: Mobility status PT Visit Diagnosis: Unsteadiness on feet (R26.81);Muscle weakness (generalized) (M62.81)     Time: 8861-8844 PT Time Calculation (min) (ACUTE ONLY): 17 min  Charges:    $Therapeutic Activity: 8-22 mins PT General Charges $$ ACUTE PT VISIT: 1 Visit                    Darice Bohr, PTA  Darice JAYSON Bohr 09/27/2024, 12:05 PM

## 2024-09-27 NOTE — Progress Notes (Signed)
 Mobility Specialist - Progress Note   09/27/24 0951  Mobility  Activity Ambulated with assistance;Stood at bedside  Level of Assistance Standby assist, set-up cues, supervision of patient - no hands on  Assistive Device Front wheel walker  Distance Ambulated (ft) 12 ft  Activity Response Tolerated well  Mobility visit 1 Mobility  Mobility Specialist Start Time (ACUTE ONLY) [id number]  Mobility Specialist Stop Time (ACUTE ONLY) 0940  Mobility Specialist Time Calculation (min) (ACUTE ONLY) 4 min   Pt amb from the bathroom and returned to bed. Pt dons undergarments with SBA and returns supine with alarm set and needs within reach.  Ian Sanders Mobility Specialist 09/27/24 9:54 AM

## 2024-09-28 ENCOUNTER — Telehealth: Payer: Self-pay

## 2024-09-28 DIAGNOSIS — I480 Paroxysmal atrial fibrillation: Secondary | ICD-10-CM | POA: Diagnosis not present

## 2024-09-28 DIAGNOSIS — D735 Infarction of spleen: Secondary | ICD-10-CM | POA: Diagnosis not present

## 2024-09-28 DIAGNOSIS — K7460 Unspecified cirrhosis of liver: Secondary | ICD-10-CM | POA: Diagnosis not present

## 2024-09-28 DIAGNOSIS — K219 Gastro-esophageal reflux disease without esophagitis: Secondary | ICD-10-CM | POA: Diagnosis not present

## 2024-09-28 DIAGNOSIS — Z79818 Long term (current) use of other agents affecting estrogen receptors and estrogen levels: Secondary | ICD-10-CM | POA: Diagnosis not present

## 2024-09-28 DIAGNOSIS — E039 Hypothyroidism, unspecified: Secondary | ICD-10-CM | POA: Diagnosis not present

## 2024-09-28 DIAGNOSIS — Z7901 Long term (current) use of anticoagulants: Secondary | ICD-10-CM | POA: Diagnosis not present

## 2024-09-28 DIAGNOSIS — Z87891 Personal history of nicotine dependence: Secondary | ICD-10-CM | POA: Diagnosis not present

## 2024-09-28 DIAGNOSIS — Z9181 History of falling: Secondary | ICD-10-CM | POA: Diagnosis not present

## 2024-09-28 DIAGNOSIS — G629 Polyneuropathy, unspecified: Secondary | ICD-10-CM | POA: Diagnosis not present

## 2024-09-28 DIAGNOSIS — I81 Portal vein thrombosis: Secondary | ICD-10-CM | POA: Diagnosis not present

## 2024-09-28 DIAGNOSIS — E871 Hypo-osmolality and hyponatremia: Secondary | ICD-10-CM | POA: Diagnosis not present

## 2024-09-28 DIAGNOSIS — I10 Essential (primary) hypertension: Secondary | ICD-10-CM | POA: Diagnosis not present

## 2024-09-28 DIAGNOSIS — D696 Thrombocytopenia, unspecified: Secondary | ICD-10-CM | POA: Diagnosis not present

## 2024-09-28 DIAGNOSIS — Z85828 Personal history of other malignant neoplasm of skin: Secondary | ICD-10-CM | POA: Diagnosis not present

## 2024-09-28 DIAGNOSIS — A044 Other intestinal Escherichia coli infections: Secondary | ICD-10-CM | POA: Diagnosis not present

## 2024-09-28 DIAGNOSIS — Z8673 Personal history of transient ischemic attack (TIA), and cerebral infarction without residual deficits: Secondary | ICD-10-CM | POA: Diagnosis not present

## 2024-09-28 DIAGNOSIS — D61818 Other pancytopenia: Secondary | ICD-10-CM | POA: Diagnosis not present

## 2024-09-28 NOTE — Telephone Encounter (Signed)
 Verbal orders given to [facility] with [facility] health.

## 2024-09-28 NOTE — Transitions of Care (Post Inpatient/ED Visit) (Signed)
 09/28/2024  Name: Ian Sanders MRN: [id number] DOB: Mar 25, 1941  Today's TOC FU Call Status: Today's TOC FU Call Status:: Successful TOC FU Call Completed TOC FU Call Complete Date: 09/28/24  Patient's Name and Date of Birth confirmed. Name, DOB  Transition Care Management Follow-up Telephone Call Date of Discharge: 09/27/24 Discharge Facility: [facility]) Type of Discharge: Inpatient Admission Primary Inpatient Discharge Diagnosis:: Colitis How have you been since you were released from the hospital?: Better Any questions or concerns?: No  Items Reviewed: Did you receive and understand the discharge instructions provided?: Yes Medications obtained,verified, and reconciled?: Yes (Medications Reviewed) Any new allergies since your discharge?: No Do you have support at home?: Yes People in Home [RPT]: spouse Name of Support/Comfort Primary Source: [place]  Medications Reviewed Today: Medications Reviewed Today     Reviewed by Eilleen Richerd GRADE, RN (Registered Nurse) on 09/28/24 at 1348  Med List Status: <None>   Medication Order Taking? Sig Documenting Provider Last Dose Status Informant  apixaban (ELIQUIS) 2.5 MG TABS tablet [id number]  Take 1 tablet (2.5 mg total) by mouth 2 (two) times daily. Von Bellis, MD  Active   calcium -vitamin D  (OSCAL) 250-125 MG-UNIT per tablet [id number] No Take 1 tablet by mouth 2 (two) times daily.  [provider] 09/21/2024 Active Self, Pharmacy Records           Med Note JOYICE, ILEANA JULIANNA Repress Jul 13, 2021  1:13 AM)    cetirizine (ZYRTEC) 10 MG tablet [id number] No Take 10 mg by mouth daily as needed for allergies. [provider] 09/22/2024 Morning Active Pharmacy Records, Self  ciprofloxacin  (CIPRO ) 500 MG tablet [id number]  Take 1 tablet (500 mg total) by mouth 2 (two) times daily for 5 days. Von Bellis, MD  Active   cyanocobalamin  1000 MCG tablet [id number] No Take 1 tablet by mouth daily.  [provider] 09/21/2024 Active Self, Pharmacy Records  diltiazem  (CARDIZEM  CD) 120 MG 24 hr capsule [id number] No Take 120 mg by mouth daily. [provider] 09/22/2024 Morning Active Self, Pharmacy Records  Ferrous Sulfate  134 MG TABS [id number] No Take 1 tablet by mouth daily. [provider] 09/21/2024 Active Self, Pharmacy Records  flecainide  (TAMBOCOR ) 50 MG tablet [id number] No Take 1 tablet (50 mg total) by mouth 2 (two) times daily. Gollan, Timothy J, MD 09/21/2024 Active Self, Pharmacy Records  gabapentin  (NEURONTIN ) 100 MG capsule [id number] No Take one in the morning, one in the afternoon, and three at night [name], MD 09/22/2024 Morning Active Pharmacy Records, Self  GEMTESA  75 MG TABS [id number] No Take 1 tablet (75 mg total) by mouth daily. Vaillancourt, Samantha, PA-C 09/22/2024 Morning Active Pharmacy Records, Self  Leuprolide  Acetate, 6 Month, (LUPRON ) 45 MG injection [id number] No Inject 45 mg into the muscle every 6 (six) months. [provider] Unknown Active Self, Pharmacy Records  losartan  (COZAAR ) 25 MG tablet [id number] No Take 1 tablet by mouth 2 (two) times daily. [provider] 09/22/2024 Morning Active Self, Pharmacy Records  meclizine  (ANTIVERT ) 25 MG tablet [id number] No Take 1 tablet by mouth daily. [provider] 09/22/2024 Morning Active Self, Pharmacy Records  omeprazole  (PRILOSEC) 40 MG capsule [id number] No Take 40 mg by mouth daily. [provider] 09/22/2024 Morning Active Self, Pharmacy Records  polyvinyl alcohol  (LIQUIFILM TEARS) 1.4 % ophthalmic solution [id number] No 1 drop as needed. [provider] Past Month Active Self, Pharmacy Records  propranolol  (INDERAL ) 10 MG tablet [id number] No Take 10  mg by mouth 2 (two) times daily. [provider] 09/21/2024 Active Pharmacy Records, Self  saccharomyces boulardii (FLORASTOR) 250 MG capsule [id number]  Take 1 capsule (250 mg total) by mouth 2  (two) times daily for 7 days. Von Bellis, MD  Active   zolpidem  (AMBIEN ) 10 MG tablet [id number] No TAKE ONE-HALF TO ONE TABLET AT BEDTIME. [name], MD 09/21/2024 Active Pharmacy Records, Self            Home Care and Equipment/Supplies: Were Home Health Services Ordered?: Yes Name of Home Health Agency:: [facility] Has Agency set up a time to come to your home?: Yes First Home Health Visit Date: 09/28/24 Any new equipment or medical supplies ordered?: No  Functional Questionnaire: Do you need assistance with meal preparation?: Yes Do you need assistance with eating?: No Do you have difficulty maintaining continence: No Do you need assistance with getting out of bed/getting out of a chair/moving?: Yes Do you have difficulty managing or taking your medications?: No  Follow up appointments reviewed: PCP Follow-up appointment confirmed?: Yes Date of PCP follow-up appointment?: 10/06/24 Follow-up Provider: Nancyann Gasper, MD Specialist Hospital Follow-up appointment confirmed?: Yes Date of Specialist follow-up appointment?: 10/04/24 Follow-Up Specialty Provider:: Eye doctor Do you need transportation to your follow-up appointment?:  (Not sure yet) Do you understand care options if your condition(s) worsen?: Yes-patient verbalized understanding  SDOH Interventions Today    Flowsheet Row Most Recent Value  SDOH Interventions   Food Insecurity Interventions Intervention Not Indicated  [Reviewed and patient states he get Meals on Wheels and had no issues]  Housing Interventions Intervention Not Indicated  Transportation Interventions Intervention Not Indicated  [Patient states he has to work on transportation for appointments with his daughter in law and his wife is 84 years old and drives but not a lot, and none at night]  Utilities Interventions Intervention Not Indicated   Patient wants a call back next week to decide on program has home health coming right now.  Attempted to explain importance of close follow up and will schedule a call back for next week.  Richerd Fish, RN, BSN, CCM [facility], [facility] Management Coordinator Direct Dial: [phone]

## 2024-10-02 ENCOUNTER — Telehealth: Payer: Self-pay

## 2024-10-02 NOTE — Telephone Encounter (Signed)
 Copied from CRM #[id number]. Topic: Clinical - Prescription Issue >> Oct 02, 2024  9:39 AM Delon HERO wrote: Reason for CRM: Katheryn calling [facility] is calling to report a level two interaction Rx #: [id number]  ciprofloxacin  (CIPRO ) 500 MG tablet [[id number]] & flecainide  (TAMBOCOR ) 50 MG tablet [[id number]]  Please advise  CB- [phone]

## 2024-10-02 NOTE — Telephone Encounter (Signed)
 Copied from CRM [id number]. Topic: Clinical - Home Health Verbal Orders >> Oct 02, 2024  3:21 PM Deaijah H wrote: Caller/Agency: tubat,Cesar PT w/ [facility]  Callback Number: [phone] Service Requested: Physical Therapy Frequency: 2x a wk for 2wks / 1x a wk for 4wks Any new concerns about the patient? No

## 2024-10-04 ENCOUNTER — Telehealth: Payer: Self-pay

## 2024-10-04 ENCOUNTER — Other Ambulatory Visit: Payer: Self-pay

## 2024-10-04 ENCOUNTER — Inpatient Hospital Stay: Admitting: Family Medicine

## 2024-10-04 DIAGNOSIS — H353221 Exudative age-related macular degeneration, left eye, with active choroidal neovascularization: Secondary | ICD-10-CM | POA: Diagnosis not present

## 2024-10-04 DIAGNOSIS — H353211 Exudative age-related macular degeneration, right eye, with active choroidal neovascularization: Secondary | ICD-10-CM | POA: Diagnosis not present

## 2024-10-04 DIAGNOSIS — M3501 Sicca syndrome with keratoconjunctivitis: Secondary | ICD-10-CM | POA: Diagnosis not present

## 2024-10-04 DIAGNOSIS — H40003 Preglaucoma, unspecified, bilateral: Secondary | ICD-10-CM | POA: Diagnosis not present

## 2024-10-04 NOTE — Patient Instructions (Addendum)
 Visit Information  Thank you for taking time to visit with me today. Please don't hesitate to contact me if I can be of assistance to you before our next scheduled telephone appointment.  Our next appointment is by telephone on 10/17/24 at 1 PM  Following is a copy of your care plan:   Goals Addressed             This Visit's Progress    [facility]BCI Transitions of Care (TOC) Care Plan       Problems:  (Reviewed 10/04/24 from last call 09/27/24) Recent Hospitalization for treatment of Colitis Call MD for: GI bleeding Call MD for: difficulty breathing, headache or visual disturbances Call MD for: extreme fatigue Call MD for: persistant dizziness or light-headedness Call MD for: persistant nausea and vomiting Call MD for: severe uncontrolled pain Call MD for: temperature >100.4 Diet - low sodium heart healthy Discharge instructions F/u with PCP in 1 wk F/u with Heme/onc in 1-2 weeks Repeat CBC, BMP and LFTs in 1-2 weeks  Goal:  Over the next 30 days, the patient will not experience hospital readmission  Interventions:  Transitions of Care: Doctor Visits  - discussed the importance of doctor visits Communication with encouraged with [facility] re: potential more HHPT visit with insurance, patient verbalized he had a total of 5 visits he believes and to speak with PCP on 10/06/24 appointment for ongoing needs. Feeling stronger since starting PT.  Patient Self Care Activities:  Attend all scheduled provider appointments Call pharmacy for medication refills 3-7 days in advance of running out of medications Call provider office for new concerns or questions  Notify RN Care Manager of TOC call rescheduling needs Participate in Transition of Care Program/Attend TOC scheduled calls Take medications as prescribed    Plan:  The care management team will reach out to the patient again over the next 7- 14 business days. The patient has been provided with contact information for the care management  team and has been advised to call with any health related questions or concerns.  10/04/24 Discussed and offered 30 day TOC program.  Patient agrees to participate with weekly follow up calls.  The patient has been provided with contact information for the care management team and has been advised to call with any health -related questions or concerns.  The patient verbalized understanding with current plan of care.  The patient is directed to their insurance card regarding availability of benefits coverage.   Patient given [facility]BCI office information at [phone] for next week if needs arise for nursing follow up we have scheduled appointment for 10/17/24 at 1300 for [facility] program follow up        Reviewed with patient today and verbalized understanding of information provided and encouraged to receive a printed copy of After Visit Summary from appointment on 11/21//25 with PCP, verbalized understanding.  The patient has been provided with contact information for the care management team and has been advised to call with any health related questions or concerns.  Follow up with provider re: Lab work from hospital summary and regarding the potential blood clot on his liver to discuss follow up with PCP appointment.  Please call the care guide team at [phone] if you need to cancel or reschedule your appointment.   Please call the [place]  National Suicide Prevention Lifeline: 7244136731 or TTY: 9414852328 TTY [phone]) to talk to a trained counselor call 1-800-273-TALK (toll free, 24 hour hotline) call 911 if you are experiencing a Mental Health or  Behavioral Health Crisis or need someone to talk to.  Richerd Fish, RN, BSN, CCM [facility], [facility] Management Coordinator Direct Dial: [phone]

## 2024-10-04 NOTE — Transitions of Care (Post Inpatient/ED Visit) (Signed)
 Transition of Care week 2  Visit Note  10/04/2024  Name: Ian Sanders MRN: [id number]          DOB: 1940/12/13  Situation: Patient enrolled in [facility] 30-day program. Visit completed with patient by telephone.   Background:   Initial Transition Care Management Follow-up Telephone Call Discharge Date and Diagnosis: 09/27/24, Colitis   Past Medical History:  Diagnosis Date   Atrial fibrillation (HCC)    Basal cell carcinoma    Benign paroxysmal positional vertigo    Cancer (HCC)    Cellulitis of left foot 09/18/2023   Closed fracture of distal end of left fibula with routine healing 09/15/2023   Heart palpitations 02/13/2020   History of chicken pox    History of measles    History of mumps    Hypertension    Mononeuritis of unspecified site    Other seborrheic keratosis    Stroke (HCC) 06/2020   Transient ischemic attack (TIA), and cerebral infarction without residual deficits(V12.54)    Traumatic subdural hematoma without loss of consciousness (HCC) 06/25/2020   Unspecified hypothyroidism     Assessment: Patient Reported Symptoms: Cognitive Cognitive Status: Alert and oriented to person, place, and time, Insightful and able to interpret abstract concepts, Normal speech and language skills      Neurological Neurological Review of Symptoms: No symptoms reported    HEENT HEENT Symptoms Reported: No symptoms reported      Cardiovascular Cardiovascular Symptoms Reported: No symptoms reported Does patient have uncontrolled Hypertension?: Yes Is patient checking Blood Pressure at home?: Yes Patient's Recent BP reading at home: [facility] PT checks (I check it from time to time but the therapist checks it) Cardiovascular Management Strategies: Medication therapy, Routine screening  Respiratory Respiratory Symptoms Reported: No symptoms reported    Endocrine Endocrine Symptoms Reported: No symptoms reported    Gastrointestinal Gastrointestinal Symptoms Reported: Abdominal pain  or discomfort Additional Gastrointestinal Details: uncomfortable from time to time, using the bathroom and taking the medications Gastrointestinal Management Strategies: Diet modification, Medication therapy    Genitourinary Genitourinary Symptoms Reported: No symptoms reported    Integumentary Integumentary Symptoms Reported: Not assessed    Musculoskeletal Musculoskelatal Symptoms Reviewed: Weakness Additional Musculoskeletal Details: Getting better with PT (Home Health) Musculoskeletal Management Strategies: Activity, Exercise, Medical device, Routine screening Musculoskeletal Self-Management Outcome: 4 (good)      Psychosocial Psychosocial Symptoms Reported: No symptoms reported         There were no vitals filed for this visit. Pain Scale: 0-10 Pain Score: 0-No pain Pain Location: Abdomen (it's on and off) Pain Orientation: Upper Pain Descriptors / Indicators: Nagging  Medications Reviewed Today     Reviewed by Eilleen Richerd GRADE, RN (Registered Nurse) on 10/04/24 at 1042  Med List Status: <None>   Medication Order Taking? Sig Documenting Provider Last Dose Status Informant  apixaban  (ELIQUIS ) 2.5 MG TABS tablet [id number] Yes Take 1 tablet (2.5 mg total) by mouth 2 (two) times daily. Von Bellis, MD  Active   calcium -vitamin D  (OSCAL) 250-125 MG-UNIT per tablet [id number] Yes Take 1 tablet by mouth 2 (two) times daily.  [provider]  Active Self, Pharmacy Records           Med Note JOYICE, ILEANA JULIANNA Repress Jul 13, 2021  1:13 AM)    cetirizine (ZYRTEC) 10 MG tablet [id number] Yes Take 10 mg by mouth daily as needed for allergies. [provider]  Active Pharmacy Records, Self  cyanocobalamin  1000 MCG tablet [id number] Yes Take 1  tablet by mouth daily. [provider]  Active Self, Pharmacy Records  diltiazem  (CARDIZEM  CD) 120 MG 24 hr capsule [id number] Yes Take 120 mg by mouth daily. [provider]  Active Self, Pharmacy Records   Ferrous Sulfate  134 MG TABS [id number] Yes Take 1 tablet by mouth daily. [provider]  Active Self, Pharmacy Records  flecainide  (TAMBOCOR ) 50 MG tablet [id number] Yes Take 1 tablet (50 mg total) by mouth 2 (two) times daily. Gollan, Timothy J, MD  Active Self, Pharmacy Records  gabapentin  (NEURONTIN ) 100 MG capsule [id number] Yes Take one in the morning, one in the afternoon, and three at night [name], MD  Active Pharmacy Records, Self  GEMTESA  75 MG TABS [id number]  Take 1 tablet (75 mg total) by mouth daily. Vaillancourt, Samantha, PA-C  Active Pharmacy Records, Self  Leuprolide  Acetate, 6 Month, (LUPRON ) 45 MG injection [id number]  Inject 45 mg into the muscle every 6 (six) months. [provider]  Active Self, Pharmacy Records  losartan  (COZAAR ) 25 MG tablet [id number] Yes Take 1 tablet by mouth 2 (two) times daily. [provider]  Active Self, Pharmacy Records  meclizine  (ANTIVERT ) 25 MG tablet [id number]  Take 1 tablet by mouth daily. [provider]  Active Self, Pharmacy Records           Med Note [place], RICHERD CINDERELLA [place] Oct 04, 2024 10:41 AM) Taking as needed  omeprazole  (PRILOSEC) 40 MG capsule [id number] Yes Take 40 mg by mouth daily. [provider]  Active Self, Pharmacy Records  polyvinyl alcohol  (LIQUIFILM TEARS) 1.4 % ophthalmic solution [id number] Yes 1 drop as needed. [provider]  Active Self, Pharmacy Records  propranolol  (INDERAL ) 10 MG tablet [id number] Yes Take 10 mg by mouth 2 (two) times daily. [provider]  Active Pharmacy Records, Self  saccharomyces boulardii (FLORASTOR) 250 MG capsule [id number] Yes Take 1 capsule (250 mg total) by mouth 2 (two) times daily for 7 days. Von Bellis, MD  Active   zolpidem  (AMBIEN ) 10 MG tablet [id number] Yes TAKE ONE-HALF TO ONE TABLET AT BEDTIME. [name], MD  Active Pharmacy Records, Self            Goals Addressed             This Visit's  Progress    VBCI Transitions of Care [facility]) Care Plan       Problems:  (Reviewed 10/04/24 from last call 09/27/24) Recent Hospitalization for treatment of Colitis Call MD for: GI bleeding Call MD for: difficulty breathing, headache or visual disturbances Call MD for: extreme fatigue Call MD for: persistant dizziness or light-headedness Call MD for: persistant nausea and vomiting Call MD for: severe uncontrolled pain Call MD for: temperature >100.4 Diet - low sodium heart healthy Discharge instructions F/u with PCP in 1 wk F/u with Heme/onc in 1-2 weeks Repeat CBC, BMP and LFTs in 1-2 weeks  Goal:  Over the next 30 days, the patient will not experience hospital readmission  Interventions:  Transitions of Care: Doctor Visits  - discussed the importance of doctor visits Communication with encouraged with [facility] re: potential more HHPT visit with insurance, patient verbalized he had a total of 5 visits he believes and to speak with PCP on 10/06/24 appointment for ongoing needs. Feeling stronger since starting PT.  Patient Self Care Activities:  Attend all scheduled provider appointments Call pharmacy for medication refills 3-7 days in advance of running out of medications Call provider office for new  concerns or questions  Financial Trader of TOC call rescheduling needs Participate in Transition of Care Program/Attend TOC scheduled calls Take medications as prescribed    Plan:  The care management team will reach out to the patient again over the next 7- 14 business days. The patient has been provided with contact information for the care management team and has been advised to call with any health related questions or concerns.  10/04/24 Discussed and offered 30 day TOC program.  Patient agrees to participate with weekly follow up calls.  The patient has been provided with contact information for the care management team and has been advised to call with any health -related  questions or concerns.  The patient verbalized understanding with current plan of care.  The patient is directed to their insurance card regarding availability of benefits coverage.   Patient given VBCI office information at [phone] for next week if needs arise for nursing follow up we have scheduled appointment for 10/17/24 at 1300 for [facility] program follow up        Recommendation:   PCP Follow-up Continue Current Plan of Care Follow up with PCP visit for labs and specialist for ? liver clot mentioned today  Follow Up Plan:   Telephone follow-up Week 3 on 10/18/23, if sooner contact [phone]  Telephone follow up appointment date/time:  10/17/24 at 1300  Richerd Fish, CHARITY FUNDRAISER, SCIENTIST, RESEARCH (PHYSICAL SCIENCES), CCM [employer], [facility] Management Coordinator Direct Dial: [phone]

## 2024-10-05 NOTE — Telephone Encounter (Signed)
 That's fine

## 2024-10-06 ENCOUNTER — Ambulatory Visit (INDEPENDENT_AMBULATORY_CARE_PROVIDER_SITE_OTHER): Admitting: Family Medicine

## 2024-10-06 ENCOUNTER — Encounter: Payer: Self-pay | Admitting: Family Medicine

## 2024-10-06 VITALS — BP 129/49 | HR 53 | Ht 69.0 in | Wt 145.0 lb

## 2024-10-06 DIAGNOSIS — K922 Gastrointestinal hemorrhage, unspecified: Secondary | ICD-10-CM | POA: Diagnosis not present

## 2024-10-06 DIAGNOSIS — K529 Noninfective gastroenteritis and colitis, unspecified: Secondary | ICD-10-CM | POA: Diagnosis not present

## 2024-10-06 DIAGNOSIS — R161 Splenomegaly, not elsewhere classified: Secondary | ICD-10-CM

## 2024-10-06 DIAGNOSIS — Z23 Encounter for immunization: Secondary | ICD-10-CM

## 2024-10-06 DIAGNOSIS — I81 Portal vein thrombosis: Secondary | ICD-10-CM

## 2024-10-06 NOTE — Telephone Encounter (Signed)
 Left voicemail requesting a call back from Ian Sanders

## 2024-10-06 NOTE — Progress Notes (Signed)
 Established patient visit   Patient: Ian Sanders   DOB: 03-06-1941   83 y.o. Male  MRN: [id number] Visit Date: 10/06/2024  Today's healthcare provider: Nancyann Perry, MD   Chief Complaint  Patient presents with   Hospitalization Follow-up    Patient was admitted to [facility] Date of admission: 09/21/2024 Date of discharge:  09/27/2024  Dx: Colitis  Recommendations from the hospital 1. F/u with PCP in 1 wk 2. F/u with Heme/onc in 1-2 weeks 3. Follow up LABS/TEST:  Repeat CBC, BMP and LFTs in 1-2 weeks     Subjective    Discussed the use of AI scribe software for clinical note transcription with the patient, who gave verbal consent to proceed.  History of Present Illness   Ian Sanders is an 83 year old male who presents for follow-up after hospitalization for colitis and portal vein thrombosis.  He was hospitalized from November 6th to November 12th for colitis and portal vein thrombosis. Since discharge, he feels weak and has trouble with balance, continuing to use a walker as he did prior to hospitalization.  His bowel movements remain irregular. He completed a course of antibiotics and now experiences diarrhea, despite a history of constipation before hospitalization. He was prescribed Miralax  for seven days and received a ten-day supply.  He describes abdominal distension, particularly on one side, feeling like a 'water balloon' when lying down. Imaging revealed an enlarged spleen measuring approximately 11 centimeters and some fluid collection around the liver.  He has an upcoming appointment with a gastroenterologist on December 1st and is awaiting a follow-up with a hematologist, which has been requested but not yet scheduled.  No current swelling in his hands, feet, or ankles, although he mentions past episodes of significant swelling in his feet.       Medications: Outpatient Medications Prior to Visit  Medication Sig   apixaban  (ELIQUIS ) 2.5 MG TABS tablet  Take 1 tablet (2.5 mg total) by mouth 2 (two) times daily.   calcium -vitamin D  (OSCAL) 250-125 MG-UNIT per tablet Take 1 tablet by mouth 2 (two) times daily.    cetirizine (ZYRTEC) 10 MG tablet Take 10 mg by mouth daily as needed for allergies.   cyanocobalamin  1000 MCG tablet Take 1 tablet by mouth daily.   diltiazem  (CARDIZEM  CD) 120 MG 24 hr capsule Take 120 mg by mouth daily.   Ferrous Sulfate  134 MG TABS Take 1 tablet by mouth daily.   flecainide  (TAMBOCOR ) 50 MG tablet Take 1 tablet (50 mg total) by mouth 2 (two) times daily.   gabapentin  (NEURONTIN ) 100 MG capsule Take one in the morning, one in the afternoon, and three at night   GEMTESA  75 MG TABS Take 1 tablet (75 mg total) by mouth daily.   Leuprolide  Acetate, 6 Month, (LUPRON ) 45 MG injection Inject 45 mg into the muscle every 6 (six) months.   losartan  (COZAAR ) 25 MG tablet Take 1 tablet by mouth 2 (two) times daily.   meclizine  (ANTIVERT ) 25 MG tablet Take 1 tablet by mouth daily.   omeprazole  (PRILOSEC) 40 MG capsule Take 40 mg by mouth daily.   polyvinyl alcohol  (LIQUIFILM TEARS) 1.4 % ophthalmic solution 1 drop as needed.   propranolol  (INDERAL ) 10 MG tablet Take 10 mg by mouth 2 (two) times daily.   zolpidem  (AMBIEN ) 10 MG tablet TAKE ONE-HALF TO ONE TABLET AT BEDTIME.   No facility-administered medications prior to visit.   Review of Systems  Constitutional:  Negative for  appetite change, chills and fever.  Respiratory:  Negative for chest tightness, shortness of breath and wheezing.   Cardiovascular:  Negative for chest pain and palpitations.  Gastrointestinal:  Negative for abdominal pain, nausea and vomiting.       Objective    BP (!) 129/49 (BP Location: Left Arm, Patient Position: Sitting, Cuff Size: Normal)   Pulse (!) 53   Ht 5' 9 (1.753 m)   Wt 145 lb (65.8 kg)   SpO2 99%   BMI 21.41 kg/m   Physical Exam   General: Appearance:    Well developed, well nourished male in no acute distress  Eyes:     PERRL, conjunctiva/corneas clear, EOM's intact       Lungs:     Clear to auscultation bilaterally, respirations unlabored  Heart:    Bradycardic. Regular rhythm.  3/6 systolic murmur at right upper sternal border   Neurologic:   Awake, alert, oriented x 3. No apparent focal neurological defect.          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 Completed antibiotics with persistent diarrhea. - Consider short-term probiotic use. - follow up GI 12-1 as scheduled.   Portal vein thrombosis Recent hospitalization. Awaiting hematologist follow-up. - Follow-up appointment with hematologist scheduled. New referral placed.   Splenomegaly Spleen size 11 cm causing abdominal distension.  Ascites Fluid collection around liver causing abdominal distension. Scheduled for gastroenterologist evaluation. - Attend gastroenterologist appointment on December 1st.  General Health Maintenance Receiving flu shot today. - Administered flu shot.         Nancyann Perry, MD  [facility] [phone] (phone) [phone] (fax)  [facility]

## 2024-10-06 NOTE — Progress Notes (Signed)
 Pharmacy Quality Measure Review  This patient is appearing on a report for being at risk of failing the adherence measure for hypertension (ACEi/ARB) medications this calendar year.   Medication: losartan  Last fill date: 10/04/24 for 90 day supply  Insurance report was not up to date. No action needed at this time.   Vansh Reckart E. Marsh, PharmD, CPP Clinical Pharmacist [facility] [phone]

## 2024-10-07 ENCOUNTER — Ambulatory Visit: Payer: Self-pay | Admitting: Family Medicine

## 2024-10-07 LAB — CBC WITH DIFFERENTIAL/PLATELET
Basophils Absolute: 0 x10E3/uL (ref 0.0–0.2)
Basos: 1 %
EOS (ABSOLUTE): 0.1 x10E3/uL (ref 0.0–0.4)
Eos: 2 %
Hematocrit: 37.8 % (ref 37.5–51.0)
Hemoglobin: 11.6 g/dL — ABNORMAL LOW (ref 13.0–17.7)
Immature Grans (Abs): 0 x10E3/uL (ref 0.0–0.1)
Immature Granulocytes: 0 %
Lymphocytes Absolute: 0.7 x10E3/uL (ref 0.7–3.1)
Lymphs: 15 %
MCH: 25.4 pg — ABNORMAL LOW (ref 26.6–33.0)
MCHC: 30.7 g/dL — ABNORMAL LOW (ref 31.5–35.7)
MCV: 83 fL (ref 79–97)
Monocytes Absolute: 0.3 x10E3/uL (ref 0.1–0.9)
Monocytes: 7 %
Neutrophils Absolute: 3.5 x10E3/uL (ref 1.4–7.0)
Neutrophils: 75 %
Platelets: 98 x10E3/uL — CL (ref 150–450)
RBC: 4.56 x10E6/uL (ref 4.14–5.80)
RDW: 13.1 % (ref 11.6–15.4)
WBC: 4.6 x10E3/uL (ref 3.4–10.8)

## 2024-10-07 LAB — HEPATIC FUNCTION PANEL
ALT: 11 IU/L (ref 0–44)
AST: 27 IU/L (ref 0–40)
Alkaline Phosphatase: 106 IU/L (ref 48–129)
Bilirubin Total: 0.7 mg/dL (ref 0.0–1.2)
Bilirubin, Direct: 0.32 mg/dL (ref 0.00–0.40)
Total Protein: 7.1 g/dL (ref 6.0–8.5)

## 2024-10-07 LAB — RENAL FUNCTION PANEL
Albumin: 4.2 g/dL (ref 3.7–4.7)
BUN/Creatinine Ratio: 15 (ref 10–24)
BUN: 12 mg/dL (ref 8–27)
CO2: 28 mmol/L (ref 20–29)
Calcium: 9.3 mg/dL (ref 8.6–10.2)
Chloride: 99 mmol/L (ref 96–106)
Creatinine, Ser: 0.78 mg/dL (ref 0.76–1.27)
Glucose: 82 mg/dL (ref 70–99)
Phosphorus: 3.7 mg/dL (ref 2.8–4.1)
Potassium: 4.7 mmol/L (ref 3.5–5.2)
Sodium: 137 mmol/L (ref 134–144)
eGFR: 88 mL/min/1.73 (ref >59)

## 2024-10-09 NOTE — Telephone Encounter (Signed)
 Verbals given to [employer] with [facility] health.

## 2024-10-11 DIAGNOSIS — K219 Gastro-esophageal reflux disease without esophagitis: Secondary | ICD-10-CM | POA: Diagnosis not present

## 2024-10-11 DIAGNOSIS — E871 Hypo-osmolality and hyponatremia: Secondary | ICD-10-CM | POA: Diagnosis not present

## 2024-10-11 DIAGNOSIS — D735 Infarction of spleen: Secondary | ICD-10-CM | POA: Diagnosis not present

## 2024-10-11 DIAGNOSIS — D696 Thrombocytopenia, unspecified: Secondary | ICD-10-CM | POA: Diagnosis not present

## 2024-10-11 DIAGNOSIS — D61818 Other pancytopenia: Secondary | ICD-10-CM | POA: Diagnosis not present

## 2024-10-11 DIAGNOSIS — I10 Essential (primary) hypertension: Secondary | ICD-10-CM | POA: Diagnosis not present

## 2024-10-11 DIAGNOSIS — A044 Other intestinal Escherichia coli infections: Secondary | ICD-10-CM | POA: Diagnosis not present

## 2024-10-11 DIAGNOSIS — K7460 Unspecified cirrhosis of liver: Secondary | ICD-10-CM | POA: Diagnosis not present

## 2024-10-11 DIAGNOSIS — Z7901 Long term (current) use of anticoagulants: Secondary | ICD-10-CM | POA: Diagnosis not present

## 2024-10-11 DIAGNOSIS — I480 Paroxysmal atrial fibrillation: Secondary | ICD-10-CM | POA: Diagnosis not present

## 2024-10-11 DIAGNOSIS — G629 Polyneuropathy, unspecified: Secondary | ICD-10-CM | POA: Diagnosis not present

## 2024-10-11 DIAGNOSIS — I81 Portal vein thrombosis: Secondary | ICD-10-CM | POA: Diagnosis not present

## 2024-10-16 DIAGNOSIS — R188 Other ascites: Secondary | ICD-10-CM | POA: Diagnosis not present

## 2024-10-16 DIAGNOSIS — R161 Splenomegaly, not elsewhere classified: Secondary | ICD-10-CM | POA: Diagnosis not present

## 2024-10-16 DIAGNOSIS — K7469 Other cirrhosis of liver: Secondary | ICD-10-CM | POA: Diagnosis not present

## 2024-10-16 DIAGNOSIS — K529 Noninfective gastroenteritis and colitis, unspecified: Secondary | ICD-10-CM | POA: Diagnosis not present

## 2024-10-16 DIAGNOSIS — I81 Portal vein thrombosis: Secondary | ICD-10-CM | POA: Diagnosis not present

## 2024-10-16 DIAGNOSIS — I82890 Acute embolism and thrombosis of other specified veins: Secondary | ICD-10-CM | POA: Diagnosis not present

## 2024-10-16 DIAGNOSIS — D696 Thrombocytopenia, unspecified: Secondary | ICD-10-CM | POA: Diagnosis not present

## 2024-10-16 NOTE — Progress Notes (Signed)
 PATIENT PROFILE: Ian Sanders is a 83 y.o. male who presents to the [facility] for consultation at the request of Dr. Nancyann Sanders for chief complaint of right-sided colitis and ascites.   HISTORY OF PRESENT ILLNESS   Ian Sanders presents to the [facility] at the request of Dr. Nancyann Sanders at [facility] for chief complaint of right-sided colitis and ascites. He presents to the clinic by himself this afternoon. His wife drove him today and is in the waiting room. He was hospitalized 11/6 - 11/12 last month at [facility] for chief complaint of acute upper abdominal pain with associated nausea without vomiting and generalized weakness. CT abd/pelvis with contrast upon admission commented on large amount of portal vein and splenic vein thrombus, cholelithiasis, colitis involving the cecum and ascending colon, small amount of pelvic free fluid, and mild amount of perihepatic and perisplenic fluid. Labs showed WBC 16.4K, hemoglobin 13.1, platelets 71K, sodium 132, BUN 21, and serum creatinine 0.63. He was treated with IV Zosyn  and IV fluid hydration. GI PCR was positive for Enteropathogenic E coli (EPEC) on 11/10 after he developed diarrhea in the hospital. He reports he was not having diarrhea prior to admission. Blood cultures were negative. He was started on daily probiotic during hospital admission and advised to continue upon discharge. He was sent home with Ciprofloxacin  500 mg BID for total of 5 days. GI consult was not obtained during admission. He was seen by Hematology (Dr. Rennie) and he was started on IV heparin  and then transitioned to Eliquis  2.5 mg twice daily. He had hospital follow-up with PCP on 11/21 recently. He reported ongoing issues with abdominal distention, weakness, balance issues, and diarrhea. He reported, I feel like my belly is a water balloon when lying down. He has appointment tomorrow with APP in Hematology.   He reports he is concerned  about ongoing diarrhea. He can have 5-6 bowel movements daily which he is not used to. Stool consistency is very loose. He is passing a clear substance in his undergarments he thinks is mucous. He denies any overt hematochezia or melena. He is not experiencing any more abdominal pain. He does feel like he is more bloated more than normal. He denies any lower extremity edema. He denies any jaundice, pruritus, nausea, vomiting, altered mental status, or sleep disturbances. He has hx of cirrhosis dating back to 2019 where there was mention of morphological features of cirrhosis on CT abd/pelvis with and without contrast which was performed for microscopic hematuria work-up. He established care with Dr. [name] of [facility] in 2019. 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 He used to drink a fifth every 2 days. He hasn't drank in at least 2 years per his report. He had EGD performed 07/2019 which showed no evidence of esophageal varices, but did comment on mild PHG. He was last seen by Dr. [name] in clinic July 2023 and was lost to follow-up. He reports he didn't like that every time he went in to see him he had to get labs drawn, immunizations ordered, and be advised to have endoscopic procedures. He reports this was a lot to manage with transportation.    GENERAL REVIEW OF SYSTEMS: General: negative for - fever, chills, weight gain, weight loss, + fatigue Head: no injury, migraines, headaches Eyes: no jaundice, itching, dryness, tearing, redness, vision changes Nose: no injury, bleeding Mouth/Throat: no oral ulcers, swollen neck, dry mouth, sore throat, hoarseness Endocrine: no heat/cold intolerance  Respiratory: no cough, wheezing, SOB Cardiovascular: no chest pain, palpitations GI: see HPI Musculoskeletal: no joint swelling, muscle/joint pain  Neurological: no seizures, syncope, dizziness, numbness/tingling Skin: no rashes, itching Hematological and Lymphatic: easy bruising, easy  bleeding  MEDICATIONS: Outpatient Encounter Medications as of 10/16/2024  Medication Sig Dispense Refill  . apixaban  (ELIQUIS ) 2.5 mg tablet Take 2.5 mg by mouth 2 (two) times daily    . calcium  carbonate-vitamin D2 250 (625)-125 mg-unit per tablet Take 1 tablet by mouth once daily       . cetirizine (ZYRTEC) 10 MG tablet Take 10 mg by mouth once daily    . chlorthalidone  25 MG tablet Take 1 tablet (25 mg total) by mouth once daily 90 tablet 2  . cyanocobalamin  (VITAMIN B12) 1000 MCG tablet Take 1,000 mcg by mouth once daily    . dilTIAZem  (CARDIZEM  CD) 120 MG XR capsule Take 1 capsule (120 mg total) by mouth once daily 90 capsule 1  . ferrous sulfate  134 mg (27 mg iron) Tab Take by mouth every other day    . flecainide  (TAMBOCOR ) 50 MG tablet Take 1 tablet (50 mg total) by mouth 2 (two) times daily 180 tablet 0  . gabapentin  (NEURONTIN ) 100 MG capsule Take 100 mg by mouth 3 (three) times daily 1 in morning, 1 in afternoon, and 3 at night    . GEMTESA  75 mg Tab Take 75 mg by mouth once daily    . leuprolide  (LUPRON  DEPOT 6 MONTH) 45 mg IM depot syringe kit Inject 45 mg into the muscle every 6 (six) months       . losartan  (COZAAR ) 25 MG tablet Take 1 tablet (25 mg total) by mouth 2 (two) times daily 180 tablet 0  . omeprazole  (PRILOSEC) 40 MG DR capsule Take 1 capsule (40 mg total) by mouth once daily 90 capsule 0  . potassium 99 mg Tab Take by mouth    . propranoloL  (INDERAL ) 10 MG tablet Take 1 tablet (10 mg total) by mouth 2 (two) times daily 60 tablet 11  . trospium  (SANCTURA ) 20 mg tablet Take 20 mg by mouth 2 (two) times daily    . zolpidem  (AMBIEN ) 10 mg tablet Take 10 mg by mouth at bedtime as needed    . cyclobenzaprine  (FLEXERIL ) 10 MG tablet  (Patient not taking: Reported on 10/16/2024)    . [DISCONTINUED] POTASSIUM ACETATE MISC Take 1 tablet by mouth once daily (Patient not taking: Reported on 10/16/2024)    . [DISCONTINUED] sodium, potassium, and magnesium  (SUPREP) oral solution  Take 1 Bottle by mouth as directed (Patient not taking: Reported on 10/16/2024)    . [DISCONTINUED] traMADoL  (ULTRAM ) 50 mg tablet at bedtime as needed (Patient not taking: Reported on 10/16/2024)     No facility-administered encounter medications on file as of 10/16/2024.    ALLERGIES: Lisinopril, Mirabegron , Atorvastatin, Beta-blockers (beta-adrenergic blocking agts), Dabigatran etexilate mesylate, Triamterene, and Amlodipine  PAST MEDICAL HISTORY: Past Medical History:  Diagnosis Date  . Atrial fibrillation (CMS/HHS-HCC)   . Hypertension   . Stroke (CMS/HHS-HCC)   . Subdural hematoma (CMS/HHS-HCC) 06/2020    PAST SURGICAL HISTORY: Past Surgical History:  Procedure Laterality Date  . LAPAROSCOPIC REPAIR DIAPHRAGMATIC HERNIA    . PROSTATE CRYOABLATION       FAMILY HISTORY: Family History  Problem Relation Name Age of Onset  . Heart disease Mother    . Coronary Artery Disease (Blocked arteries around heart) Mother    . Leukemia Father  SOCIAL HISTORY: Social History   Socioeconomic History  . Marital status: Married  Tobacco Use  . Smoking status: Former  . Smokeless tobacco: Current    Types: Chew  Vaping Use  . Vaping status: Never Used  Substance and Sexual Activity  . Alcohol  use: Yes    Alcohol /week: 2.0 standard drinks of alcohol     Types: 2 Shots of liquor per week  . Drug use: Never   Social Drivers of Corporate Investment Banker Strain: Low Risk  (10/16/2024)   Overall Financial Resource Strain (CARDIA)   . Difficulty of Paying Living Expenses: Not very hard  Food Insecurity: No Food Insecurity (10/16/2024)   Hunger Vital Sign   . Worried About Programme Researcher, Broadcasting/film/video in the Last Year: Never true   . Ran Out of Food in the Last Year: Never true  Transportation Needs: No Transportation Needs (10/16/2024)   PRAPARE - Transportation   . Lack of Transportation (Medical): No   . Lack of Transportation (Non-Medical): No    Vitals:   10/16/24 1456  BP:  117/62  Pulse: 69  Weight: 68 kg (150 lb)  Height: 177.8 cm (5' 10)    Wt Readings from Last 3 Encounters:  10/16/24 68 kg (150 lb)  04/27/23 72.6 kg (160 lb)  02/10/23 72.6 kg (160 lb)     PHYSICAL EXAM: Pleasant, non-toxic appearing male on exam bench. Answers all questions appropriately and thoroughly.  General: NAD, alert and oriented x 4 HEENT: PEERLA, EOMI, anciteric  Neck: supple, no JVD or thyromegaly. No lymphadenopathy.  Respiratory: CTA bilaterally, no wheezes, crackles, or other adventitious sounds Cardiac: RRR, no murmur, rub, or gallop  GI: soft, normal bowel sounds, no TTP, no HSM, no rebound or guarding Msk: no edema, well perfused with 2+ pulses, Skin: Skin color, texture, turgor normal, no rashes or lesions Lymph: no LAD Neuro: Grossly intact   REVIEW OF DATA: I have reviewed the following data today:  Previous OV notes with PCP reviewed - see HPI  CT abd/pelvis with contrast 09/21/2024: FINDINGS:  Lower chest: Moderate severity scarring and/or atelectasis is seen  within the bilateral lower lobes.   Hepatobiliary: No focal liver abnormality is seen. Numerous  subcentimeter gallstones are seen within the lumen of a moderately  distended gallbladder. There is no evidence of gallbladder wall  thickening, or biliary dilatation.   Pancreas: Unremarkable. No pancreatic ductal dilatation or  surrounding inflammatory changes.   Spleen: Small, stable, ill-defined areas of parenchymal low  attenuation are seen scattered throughout a markedly enlarged spleen  (the largest measures approximately 11 mm).   Adrenals/Urinary Tract: Adrenal glands are unremarkable. Kidneys are  normal in size, without renal calculi or hydronephrosis. Multiple  bilateral simple renal cysts are seen. Bladder is unremarkable.   Stomach/Bowel: Stomach is within normal limits. Appendix appears  normal. No evidence of bowel dilatation. The walls of the cecum and  ascending colon are  thickened and edematous in appearance. A  moderate amount of inflammatory fat stranding and a small amount of  fluid are seen along the adjacent portions of the right paracolic  gutter and lateral aspect of the mid to lower right abdomen.   Vascular/Lymphatic: Aortic atherosclerosis. A large amount of  proximal portal vein thrombus is seen. This extends into the distal  aspect of the splenic vein. (Axial CT images 32 through 35, CT  series 2). No enlarged abdominal or pelvic lymph nodes.   Reproductive: The prostate gland is surgically absent.  Other: There is a 2.1 cm x 3.3 cm fluid-filled left inguinal hernia.   A mild amount of perihepatic and perisplenic fluid is seen. A small  amount of pelvic free fluid is present on the right.   Musculoskeletal: A chronic compression fracture deformity is seen at  the level of L1, with multilevel degenerative changes present  throughout the lumbar spine.   IMPRESSION:  1. 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  2. Cholelithiasis.  3. Findings consistent with colitis involving the cecum and  ascending colon.  4. Small amount of pelvic free fluid.  5. Multiple bilateral simple renal cysts.  6. Chronic compression fracture deformity at the level of L1.  7. Aortic atherosclerosis.   US  Abd Complete 09/03/2023: IMPRESSION:  1. Cirrhosis of liver. No focal liver lesion identified.  2. Trace free fluid noted around the liver.  3. Splenomegaly.  4. Question midline varicosities noted anterior to the pancreas with  internal thrombus.   RUQ US  09/30/2022: IMPRESSION:  1. Cirrhotic morphology of the liver. No focal lesion identified.  2.  Cholelithiasis without secondary signs of acute cholecystitis.  3. Common bile duct is dilated measuring 7 mm. Recommend correlation  with LFTs. If abnormal, recommend further evaluation with MRI/MRCP.   Summary of GI Procedures:  EGD 08/16/2019 [facility]) - normal esophagus, mild portal  hypertensive gastropathy, few gastric polyps with biopsies showing hyperplastic polyp with surface ulceration negative for dysplasia, normal duodenum   CSY 05/23/2018 [facility]) - two 4-6 mm polyps removed from rectum and transverse colon with path showing hyperplastic polyp and SSA, one 7 mm TA removed from cecum, one 3 mm hyperplastic polyp removed from ascending colon   ASSESSMENT AND PLAN: Mr. Cremer is a 83 y.o. male presenting for an initial consultation.   Diagnoses and all orders for this visit:  Right-sided colitis -     GI Profile, Stool, PCR - [facility]; Future -     Calprotectin, Fecal - [facility]; Future -     C. difficile - [facility]; Future  Other ascites -     US  paracentesis; Future  Portal vein thrombosis  Splenic vein thrombosis  Other cirrhosis of liver (CMS/HHS-HCC) -     US  paracentesis; Future  Splenomegaly -     US  paracentesis; Future  Thrombocytopenia -     US  paracentesis; Future    83 y/o Caucasian male with a PMH of HTN, hx of TIA/CVA, PAF not on chronic anticoagulation, cirrhosis, chronic thrombocytopenia, hx of prostate cancer s/p prostatectomy 1995, hx of traumatic subdural hematoma 2021, BPPV, and hx of basal call carcinoma presents to the [facility] to establish care  Right-sided colitis - suspected 2/2 E coli colitis from positive GI PCR as inpatient, CT scan commented on colitis involving the cecum and ascending colon. He is s/p IV Zosyn  and 5-day course of Cipro  500 mg BID.   Ascites - mention of mild amount of perihepatic and perisplenic fluid and small amount of pelvic free fluid on CT scan 09/21/24  PVT/SVT - diagnosed 09/21/2024, recently just started low dose Eliquis , appt tomorrow with Hematology   Alcoholic cirrhosis - compensated currently, previously followed by Dr. [name], last seen by Dr. [name] 05/2022  Portal hypertension - c/b thrombocytopenia, splenomegaly  Diarrhea - possible ongoing right-sided colitis, infectious gastroenteritis,  microscopic colitis, IBD, side effects from antibiotics, etc  - Reviewed recent hospital admission imaging, labs, and notes - Recommend repeat stool studies - GI PCR, fecal calprotectin. Advised him to try  his best to turn these in tomorrow to [facility] lab prior to his appointment with Hematology.  - Diagnostic paracentesis ordered with labs - Repeat labs tomorrow. Will message Tinnie Dawn via Secure Chat regarding labs to order to minimize number of blood draws.  - Discussed possible repeat endoscopic procedures. Patient declines at this time.  - I reviewed nature of SVT and risk of gastric varices. He declines any endoscopic procedure at this time. Will attempt to revisit this in the future.  - H&H currently stable with no signs of overt gastrointestinal bleeding - Continue strict alcohol  abstinence  - Avoid NSAIDs - Further recs after labs and diagnostic paracentesis - Advised patient to continue to keep close follow-up with Hematology for management of PVT/SVT  F/U after labs, stool studies, and diagnostic paracentesis    Attestation Statement:   I personally performed the service, non-incident to. [facility])   TWYLA JONETTE PRIMMER, PA     Jonette Primmer, PA-C [facility], Gastroenterology [address] [place], [place] [address] [phone]

## 2024-10-17 ENCOUNTER — Inpatient Hospital Stay: Admitting: Nurse Practitioner

## 2024-10-17 ENCOUNTER — Other Ambulatory Visit: Payer: Self-pay | Admitting: *Deleted

## 2024-10-17 ENCOUNTER — Inpatient Hospital Stay

## 2024-10-17 ENCOUNTER — Other Ambulatory Visit: Payer: Self-pay

## 2024-10-17 ENCOUNTER — Telehealth: Payer: Self-pay

## 2024-10-17 ENCOUNTER — Observation Stay
Admission: EM | Admit: 2024-10-17 | Discharge: 2024-10-22 | Disposition: A | Attending: Internal Medicine | Admitting: Internal Medicine

## 2024-10-17 DIAGNOSIS — K922 Gastrointestinal hemorrhage, unspecified: Secondary | ICD-10-CM

## 2024-10-17 DIAGNOSIS — K7031 Alcoholic cirrhosis of liver with ascites: Secondary | ICD-10-CM | POA: Insufficient documentation

## 2024-10-17 DIAGNOSIS — K921 Melena: Secondary | ICD-10-CM | POA: Diagnosis present

## 2024-10-17 DIAGNOSIS — K5150 Left sided colitis without complications: Secondary | ICD-10-CM

## 2024-10-17 DIAGNOSIS — D61818 Other pancytopenia: Secondary | ICD-10-CM | POA: Insufficient documentation

## 2024-10-17 DIAGNOSIS — I81 Portal vein thrombosis: Secondary | ICD-10-CM | POA: Insufficient documentation

## 2024-10-17 DIAGNOSIS — K51511 Left sided colitis with rectal bleeding: Principal | ICD-10-CM | POA: Insufficient documentation

## 2024-10-17 DIAGNOSIS — I480 Paroxysmal atrial fibrillation: Secondary | ICD-10-CM | POA: Insufficient documentation

## 2024-10-17 DIAGNOSIS — Z7901 Long term (current) use of anticoagulants: Secondary | ICD-10-CM | POA: Diagnosis not present

## 2024-10-17 DIAGNOSIS — K625 Hemorrhage of anus and rectum: Principal | ICD-10-CM

## 2024-10-17 DIAGNOSIS — K7030 Alcoholic cirrhosis of liver without ascites: Secondary | ICD-10-CM | POA: Diagnosis not present

## 2024-10-17 DIAGNOSIS — Z85828 Personal history of other malignant neoplasm of skin: Secondary | ICD-10-CM | POA: Insufficient documentation

## 2024-10-17 DIAGNOSIS — Z91040 Latex allergy status: Secondary | ICD-10-CM | POA: Insufficient documentation

## 2024-10-17 DIAGNOSIS — I10 Essential (primary) hypertension: Secondary | ICD-10-CM | POA: Insufficient documentation

## 2024-10-17 DIAGNOSIS — K7469 Other cirrhosis of liver: Secondary | ICD-10-CM

## 2024-10-17 DIAGNOSIS — D696 Thrombocytopenia, unspecified: Secondary | ICD-10-CM | POA: Insufficient documentation

## 2024-10-17 DIAGNOSIS — R54 Age-related physical debility: Secondary | ICD-10-CM | POA: Insufficient documentation

## 2024-10-17 DIAGNOSIS — R161 Splenomegaly, not elsewhere classified: Secondary | ICD-10-CM

## 2024-10-17 DIAGNOSIS — F1010 Alcohol abuse, uncomplicated: Secondary | ICD-10-CM | POA: Insufficient documentation

## 2024-10-17 DIAGNOSIS — Z8673 Personal history of transient ischemic attack (TIA), and cerebral infarction without residual deficits: Secondary | ICD-10-CM | POA: Insufficient documentation

## 2024-10-17 DIAGNOSIS — Z87891 Personal history of nicotine dependence: Secondary | ICD-10-CM | POA: Insufficient documentation

## 2024-10-17 DIAGNOSIS — E039 Hypothyroidism, unspecified: Secondary | ICD-10-CM | POA: Insufficient documentation

## 2024-10-17 LAB — PROTIME-INR
INR: 1.5 — ABNORMAL HIGH (ref 0.8–1.2)
Prothrombin Time: 18.7 s — ABNORMAL HIGH (ref 11.4–15.2)

## 2024-10-17 LAB — CBC
HCT: 34 % — ABNORMAL LOW (ref 39.0–52.0)
Hemoglobin: 10.9 g/dL — ABNORMAL LOW (ref 13.0–17.0)
MCH: 25.6 pg — ABNORMAL LOW (ref 26.0–34.0)
MCHC: 32.1 g/dL (ref 30.0–36.0)
MCV: 80 fL (ref 80.0–100.0)
Platelets: 68 K/uL — ABNORMAL LOW (ref 150–400)
RBC: 4.25 MIL/uL (ref 4.22–5.81)
RDW: 13.7 % (ref 11.5–15.5)
WBC: 5.5 K/uL (ref 4.0–10.5)
nRBC: 0 % (ref 0.0–0.2)

## 2024-10-17 LAB — COMPREHENSIVE METABOLIC PANEL WITH GFR
ALT: 6 U/L (ref 0–44)
AST: 19 U/L (ref 15–41)
Albumin: 3.9 g/dL (ref 3.5–5.0)
Alkaline Phosphatase: 89 U/L (ref 38–126)
Anion gap: 12 (ref 5–15)
BUN: 14 mg/dL (ref 8–23)
CO2: 24 mmol/L (ref 22–32)
Calcium: 9.1 mg/dL (ref 8.9–10.3)
Chloride: 98 mmol/L (ref 98–111)
Creatinine, Ser: 0.66 mg/dL (ref 0.61–1.24)
GFR, Estimated: >60 mL/min (ref >60)
Glucose, Bld: 100 mg/dL — ABNORMAL HIGH (ref 70–99)
Potassium: 4.2 mmol/L (ref 3.5–5.1)
Sodium: 134 mmol/L — ABNORMAL LOW (ref 135–145)
Total Bilirubin: 1.1 mg/dL (ref 0.0–1.2)
Total Protein: 7 g/dL (ref 6.5–8.1)

## 2024-10-17 LAB — TYPE AND SCREEN
ABO/RH(D): O POS
Antibody Screen: NEGATIVE

## 2024-10-17 LAB — HEMOGLOBIN AND HEMATOCRIT, BLOOD
HCT: 29.9 % — ABNORMAL LOW (ref 39.0–52.0)
Hemoglobin: 9.7 g/dL — ABNORMAL LOW (ref 13.0–17.0)

## 2024-10-17 LAB — APTT: aPTT: 39 s — ABNORMAL HIGH (ref 24–36)

## 2024-10-17 MED ORDER — MECLIZINE HCL 25 MG PO TABS: 25.0000 mg | ORAL_TABLET | Freq: Every day | ORAL | Status: DC | PRN

## 2024-10-17 MED ORDER — SODIUM CHLORIDE 0.9 % IV SOLN: INTRAVENOUS | Status: AC

## 2024-10-17 MED ORDER — PANTOPRAZOLE SODIUM 40 MG PO TBEC
40.0000 mg | DELAYED_RELEASE_TABLET | Freq: Every day | ORAL | Status: DC
  Administered 2024-10-18 – 2024-10-22 (×4): 40 mg via ORAL
  Filled 2024-10-17 (×4): qty 1

## 2024-10-17 MED ORDER — ONDANSETRON HCL 4 MG PO TABS: 4.0000 mg | ORAL_TABLET | Freq: Four times a day (QID) | ORAL | Status: DC | PRN

## 2024-10-17 MED ORDER — HYDROMORPHONE HCL 1 MG/ML IJ SOLN: 0.5000 mg | INTRAMUSCULAR | Status: DC | PRN

## 2024-10-17 MED ORDER — RISAQUAD PO CAPS
1.0000 | ORAL_CAPSULE | Freq: Three times a day (TID) | ORAL | Status: DC
  Administered 2024-10-17 – 2024-10-22 (×14): 1 via ORAL
  Filled 2024-10-17 (×14): qty 1

## 2024-10-17 MED ORDER — PROPRANOLOL HCL 20 MG PO TABS: 10.0000 mg | ORAL_TABLET | Freq: Two times a day (BID) | ORAL | Status: DC

## 2024-10-17 MED ORDER — MIRABEGRON ER 25 MG PO TB24
25.0000 mg | ORAL_TABLET | Freq: Every day | ORAL | Status: DC
  Administered 2024-10-18 – 2024-10-22 (×4): 25 mg via ORAL
  Filled 2024-10-17 (×5): qty 1

## 2024-10-17 MED ORDER — PEG 3350-KCL-NA BICARB-NACL 420 G PO SOLR
4000.0000 mL | Freq: Once | ORAL | Status: AC
  Administered 2024-10-18: 4000 mL via ORAL
  Filled 2024-10-17: qty 4000

## 2024-10-17 MED ORDER — METOPROLOL TARTRATE 5 MG/5ML IV SOLN: 2.5000 mg | INTRAVENOUS | Status: DC | PRN

## 2024-10-17 MED ORDER — ONDANSETRON HCL 4 MG/2ML IJ SOLN: 4.0000 mg | Freq: Four times a day (QID) | INTRAMUSCULAR | Status: DC | PRN

## 2024-10-17 MED ORDER — LORATADINE 10 MG PO TABS
10.0000 mg | ORAL_TABLET | Freq: Every day | ORAL | Status: DC
  Administered 2024-10-18 – 2024-10-22 (×4): 10 mg via ORAL
  Filled 2024-10-17 (×4): qty 1

## 2024-10-17 MED ORDER — GABAPENTIN 300 MG PO CAPS
300.0000 mg | ORAL_CAPSULE | Freq: Every day | ORAL | Status: DC
  Administered 2024-10-17 – 2024-10-21 (×5): 300 mg via ORAL
  Filled 2024-10-17 (×5): qty 1

## 2024-10-17 MED ORDER — GABAPENTIN 100 MG PO CAPS
100.0000 mg | ORAL_CAPSULE | Freq: Two times a day (BID) | ORAL | Status: DC
  Administered 2024-10-18 – 2024-10-22 (×8): 100 mg via ORAL
  Filled 2024-10-17 (×8): qty 1

## 2024-10-17 MED ORDER — FLECAINIDE ACETATE 50 MG PO TABS
50.0000 mg | ORAL_TABLET | Freq: Two times a day (BID) | ORAL | Status: DC
  Administered 2024-10-17 – 2024-10-22 (×9): 50 mg via ORAL
  Filled 2024-10-17 (×11): qty 1

## 2024-10-17 NOTE — Consult Note (Signed)
 [name], MD [facility]  [address], [address] [place], [place] [address] Phone: [phone] Fax : [phone]  Consultation  Referring Provider:     Dr. Laurita Primary Care Physician:  Ian Nancyann BRAVO, MD Primary Gastroenterologist: [facility] Reason for Consultation:     Rectal bleeding  Date of Admission:  10/17/2024 Date of Consultation:  10/17/2024         HPI:   Ian Sanders is a 83 y.o. male who has a history of alcoholic cirrhosis with chronic thrombocytopenia and a history of A-fib.  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  The patient had his diagnosis at the beginning of November when he was admitted with a CT scan also showing colitis.  The patient was treated for E. coli in his stools with ciprofloxacin .  He reports that his last dose of Eliquis  was this morning.  He reported that he had some bright red blood per rectum that was only on the toilet paper but then he put a towel in his undergarments and had bright red blood covering the towel.  He then came to the emergency  department and had a passage of mucus and blood.  The patient does report some lower abdominal pain.  The patient's blood work showed:  Component     Latest Ref Rng 09/27/2024 10/06/2024 10/17/2024  Hemoglobin     13.0 - 17.0 g/dL 8.8 (L)  88.3 (L)  89.0 (L)   HCT     39.0 - 52.0 % 27.5 (L)  37.8  34.0 (L)    He reports that he has not had a colonoscopy in many years.  His last colonoscopy was by Dr. [name] in 2019.  At that time the patient had multiple polyps removed.  He was recommended to have a repeat colonoscopy in 5 years and a letter that was sent out in July 2019.  The patient was seen at the [facility] clinic GI practice yesterday for a follow-up with a report of ongoing diarrhea and had reported that he was not having any GI bleeding at that time.  There is also reported the patient's last EGD did not show any  signs of varices.  Past Medical History:  Diagnosis Date   Atrial fibrillation (HCC)    Basal cell carcinoma    Benign paroxysmal positional vertigo    Cancer (HCC)    Cellulitis of left foot 09/18/2023   Closed fracture of distal end of left fibula with routine healing 09/15/2023   Heart palpitations 02/13/2020   History of chicken pox    History of measles    History of mumps    Hypertension    Mononeuritis of unspecified site    Other seborrheic keratosis    Stroke (HCC) 06/2020   Transient ischemic attack (TIA), and cerebral infarction without residual deficits(V12.54)    Traumatic subdural hematoma without loss of consciousness (HCC) 06/25/2020   Unspecified hypothyroidism     Past Surgical History:  Procedure Laterality Date   Carotid Doppler Ultrasound  04/02/2010   Small amount Calcified plaque bilaterally, no significant stenosis.   CATARACT EXTRACTION W/PHACO Right 04/15/2021   Procedure: CATARACT EXTRACTION PHACO AND INTRAOCULAR LENS PLACEMENT (IOC) RIGHT;  Surgeon: Jaye Fallow, MD;  Location: [facility];  Service: Ophthalmology;  Laterality: Right;  7.98 0:47.2   CATARACT EXTRACTION W/PHACO Left 05/20/2021   Procedure: CATARACT EXTRACTION PHACO  AND INTRAOCULAR LENS PLACEMENT (IOC) LEFT 3.37 00:32.0;  Surgeon: Jaye Fallow, MD;  Location: [facility];  Service: Ophthalmology;  Laterality: Left;  Requests arrival 10A or after   COLONOSCOPY  05/11/2014   Tubular Adenoma. Dr. Jinny   COLONOSCOPY WITH PROPOFOL  N/A 05/23/2018   Procedure: COLONOSCOPY WITH PROPOFOL ;  Surgeon: [name] Bi, MD;  Location: [facility] ENDOSCOPY;  Service: Gastroenterology;  Laterality: N/A;   DOPPLER ECHOCARDIOGRAPHY  04/02/2010   Mild left atrial dilation. Normal right atrium. No valvular disease. No thrombus. Normal LV function. EF>55%   ESOPHAGOGASTRODUODENOSCOPY (EGD) WITH PROPOFOL  N/A 08/16/2019   Procedure: ESOPHAGOGASTRODUODENOSCOPY (EGD) WITH PROPOFOL ;  Surgeon:  [name] Bi, MD;  Location: [facility] ENDOSCOPY;  Service: Gastroenterology;  Laterality: N/A;   FINGER AMPUTATION     HERNIA REPAIR Right    MRI Brain with and without contrast  03/20/2010   Suggestive of basal ganglia lacunar infarct   PROSTATECTOMY  11/16/1993   Abdominal   SKIN SURGERY     multiple   Thumb surgery      Prior to Admission medications   Medication Sig Start Date End Date Taking? Authorizing Provider  apixaban  (ELIQUIS ) 2.5 MG TABS tablet Take 1 tablet (2.5 mg total) by mouth 2 (two) times daily. 09/27/24 03/26/25  Von Bellis, MD  calcium -vitamin D  (OSCAL) 250-125 MG-UNIT per tablet Take 1 tablet by mouth 2 (two) times daily.     [provider]  cetirizine (ZYRTEC) 10 MG tablet Take 10 mg by mouth daily as needed for allergies.    [provider]  cyanocobalamin  1000 MCG tablet Take 1 tablet by mouth daily. 04/24/15   [provider]  diltiazem  (CARDIZEM  CD) 120 MG 24 hr capsule Take 120 mg by mouth daily. 06/09/22   [provider]  Ferrous Sulfate  134 MG TABS Take 1 tablet by mouth daily.    [provider]  flecainide  (TAMBOCOR ) 50 MG tablet Take 1 tablet (50 mg total) by mouth 2 (two) times daily. 07/15/17   Gollan, Timothy J, MD  gabapentin  (NEURONTIN ) 100 MG capsule Take one in the morning, one in the afternoon, and three at night 01/16/24   Ian Nancyann BRAVO, MD  GEMTESA  75 MG TABS Take 1 tablet (75 mg total) by mouth daily. 08/07/24   Vaillancourt, Samantha, PA-C  Leuprolide  Acetate, 6 Month, (LUPRON ) 45 MG injection Inject 45 mg into the muscle every 6 (six) months.    [provider]  losartan  (COZAAR ) 25 MG tablet Take 1 tablet by mouth 2 (two) times daily.    [provider]  meclizine  (ANTIVERT ) 25 MG tablet Take 1 tablet by mouth daily.    [provider]  omeprazole  (PRILOSEC) 40 MG capsule Take 40 mg by mouth daily. 09/02/23   [provider]  polyvinyl alcohol  (LIQUIFILM TEARS) 1.4 %  ophthalmic solution 1 drop as needed.    [provider]  propranolol  (INDERAL ) 10 MG tablet Take 10 mg by mouth 2 (two) times daily. 09/15/24   [provider]  zolpidem  (AMBIEN ) 10 MG tablet TAKE ONE-HALF TO ONE TABLET AT BEDTIME. 09/16/24   Ian Nancyann BRAVO, MD    Family History  Problem Relation Age of Onset   Heart disease Mother 93       CABG   Hypertension Mother    Heart attack Mother    Hypertension Brother    Heart disease Sister        stents   Prostate cancer Brother    Kidney cancer Brother  Skin cancer Brother    Hypertension Brother    Bladder Cancer Sister    Leukemia Father    Stroke Father        multiple   Cerebral aneurysm Sister    Kidney cancer Brother      Social History   Tobacco Use   Smoking status: Former    Current packs/day: 0.00    Types: Cigarettes   Smokeless tobacco: Current    Types: Chew   Tobacco comments:    quit over 45 years ago  Vaping Use   Vaping status: Never Used  Substance Use Topics   Alcohol  use: Not Currently    Alcohol /week: 5.0 standard drinks of alcohol     Types: 5 Cans of beer per week    Comment: quit drinking summer 2021   Drug use: No    Allergies as of 10/17/2024 - Review Complete 10/17/2024  Allergen Reaction Noted   Lisinopril Swelling 02/12/2015   Myrbetriq  [mirabegron ] Swelling 12/26/2015   Beta adrenergic blockers  08/20/2020   Other Other (See Comments) 08/20/2020   Triamterene  08/27/2004   Amlodipine Other (See Comments) 06/11/2020   Atorvastatin Other (See Comments) 06/19/2015   Dabigatran etexilate mesylate Other (See Comments) 12/14/2011   Dabigatran etexilate mesylate Other (See Comments) 12/14/2011   Latex Rash 09/11/2023    Review of Systems:    All systems reviewed and negative except where noted in HPI.   Physical Exam:  Vital signs in last 24 hours: Temp:  [98.7 F (37.1 C)-99.1 F (37.3 C)] 98.7 F (37.1 C) (12/02 1650) Pulse Rate:  [57-66] 63 (12/02  1650) Resp:  [15-18] 18 (12/02 1650) BP: (108-158)/(48-63) 158/63 (12/02 1650) SpO2:  [95 %-100 %] 100 % (12/02 1650) Weight:  [68 kg] 68 kg (12/02 1153)   General:   Pleasant, cooperative in NAD Head:  Normocephalic and atraumatic. Eyes:   No icterus.   Conjunctiva pink. PERRLA. Ears:  Normal auditory acuity. Neck:  Supple; no masses or thyroidomegaly Lungs: Respirations even and unlabored. Lungs clear to auscultation bilaterally.   No wheezes, crackles, or rhonchi.  Heart:  Regular rate and rhythm;  Without murmur, clicks, rubs or gallops Abdomen:  Soft, positive distention with ascites, nontender. Normal bowel sounds. No appreciable masses or hepatomegaly.  No rebound or guarding.  Rectal:  Not performed. Msk:  Symmetrical without gross deformities.   Extremities:  Without edema, cyanosis or clubbing. Neurologic:  Alert and oriented x3;  grossly normal neurologically. Skin:  Intact without significant lesions or rashes. Cervical Nodes:  No significant cervical adenopathy. Psych:  Alert and cooperative. Normal affect.  LAB RESULTS: Recent Labs    10/17/24 1155  WBC 5.5  HGB 10.9*  HCT 34.0*  PLT 68*   BMET Recent Labs    10/17/24 1155  NA 134*  K 4.2  CL 98  CO2 24  GLUCOSE 100*  BUN 14  CREATININE 0.66  CALCIUM  9.1   LFT Recent Labs    10/17/24 1155  PROT 7.0  ALBUMIN 3.9  AST 19  ALT 6  ALKPHOS 89  BILITOT 1.1   PT/INR Recent Labs    10/17/24 1411  LABPROT 18.7*  INR 1.5*    STUDIES: No results found.    Impression / Plan:   Assessment: Principal Problem:   Lower GI bleed Active Problems:   Chronic anticoagulation   Portal vein thrombosis   Ian Sanders is a 83 y.o. 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  The patient came to the hospital due to the rectal bleeding and him being on Eliquis  for a portal vein thrombosis.  Plan:  It appears that the  patient's Eliquis  has been held therefore the patient will be started on a clear liquid diet with a prep to be given tomorrow so that he can have a colonoscopy on Thursday.  The patient was offered to wait and see how the bleeding progresses and asked whether he wanted to proceed with a colonoscopy and the patient states that he would like to find out what is going on therefore he will be set up for a colonoscopy when the Eliquis  is worn off on Thursday of this week.  Patient has been explained the plan and agrees with it.  Thank you for involving me in the care of this patient.      LOS: 0 days   [name], MD, MD. NOLIA 10/17/2024, 5:36 PM,  Pager [phone] 7am-5pm  Check AMION for 5pm -7am coverage and on weekends   Note: This dictation was prepared with Dragon dictation along with smaller phrase technology. Any transcriptional errors that result from this process are unintentional.

## 2024-10-17 NOTE — ED Triage Notes (Signed)
 Pt c/o rectal bleeding since yesterday. Pt seen at [facility] GI yesterday to establish care post admission in November. Pt has hx of cirrhosis and is on eliquis  for a-fib. Pt denies pain.

## 2024-10-17 NOTE — H&P (Signed)
 History and Physical    Ian Sanders FMW:[id number] DOB: 28-Oct-1941 DOA: 10/17/2024  PCP: [name], MD (Confirm with patient/family/NH records and if not entered, this has to be entered at [facility] point of entry) Patient coming from: Home  I have personally briefly reviewed patient's old medical records in [facility]  Chief Complaint: Rectal bleeding  HPI: Ian Sanders is a 83 y.o. 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  Patient was recently hospitalized 2 to 3 weeks ago for right-sided colitis, wound CT abdomen pelvis showed large amount of portal vein and splenic vein thrombosis.  Patient was treated with antibiotics, parenteral anticoagulation then switched to p.o. antibiotics and Eliquis  on discharge.  Patient reported still having frequent right sided abdominal crampings and loose bowel movement 6-7 times a day.  Denies any fever chills no nauseous vomiting.  He was seen by GI PA in the office yesterday and was told everything was fine.  Last night, patient had 1 loose brownish bowel movement.  When he wiped, he found bright red blood on the toilet paper and no blood in the toilet bowl.  Later in the evening, he also found his underwear stained with blood.  No tenesmus no fever or chills.  This morning he had another episode with bright red blood on the toilet paper after passing loose bowel movement.  Denies any chest pain shortness of breath palpitations.  ED Course: Afebrile, heart rate 64 blood pressure 108/48 O2 saturation 98% room air.  Blood work showed hemoglobin 10.9 compared to baseline 11.6 of 2 weeks ago, BUN 14 creatinine 0.6 bicarb 24.  ED physician who did rectal exam and found brownish stool.  Review of Systems: As per HPI otherwise 14 point review of systems negative.     Past Medical History:  Diagnosis Date   Atrial fibrillation (HCC)    Basal cell carcinoma    Benign paroxysmal positional vertigo    Cancer (HCC)    Cellulitis of left foot 09/18/2023   Closed fracture of distal end of left fibula with routine healing 09/15/2023   Heart palpitations 02/13/2020   History of chicken pox    History of measles    History of mumps    Hypertension    Mononeuritis of unspecified site    Other seborrheic keratosis    Stroke (HCC) 06/2020   Transient ischemic attack (TIA), and cerebral infarction without residual deficits(V12.54)    Traumatic subdural hematoma without loss of consciousness (HCC) 06/25/2020   Unspecified hypothyroidism     Past Surgical History:  Procedure Laterality Date   Carotid Doppler Ultrasound  04/02/2010   Small amount Calcified plaque bilaterally, no significant stenosis.   CATARACT EXTRACTION W/PHACO Right 04/15/2021   Procedure: CATARACT EXTRACTION PHACO AND INTRAOCULAR LENS PLACEMENT (IOC) RIGHT;  Surgeon: Jaye Fallow, MD;  Location: [facility];  Service: Ophthalmology;  Laterality: Right;  7.98 0:47.2   CATARACT EXTRACTION W/PHACO Left 05/20/2021   Procedure: CATARACT EXTRACTION PHACO AND INTRAOCULAR LENS PLACEMENT (IOC) LEFT 3.37 00:32.0;  Surgeon: Jaye Fallow, MD;  Location: [facility];  Service: Ophthalmology;  Laterality: Left;  Requests arrival 10A or after   COLONOSCOPY  05/11/2014   Tubular Adenoma. Dr. Jinny   COLONOSCOPY WITH PROPOFOL  N/A 05/23/2018   Procedure: COLONOSCOPY WITH PROPOFOL ;  Surgeon: [name] Bi, MD;  Location: [facility] ENDOSCOPY;  Service: Gastroenterology;  Laterality: N/A;   DOPPLER ECHOCARDIOGRAPHY  04/02/2010   Mild left atrial dilation. Normal right atrium. No valvular disease. No thrombus. Normal LV function. EF>55%   ESOPHAGOGASTRODUODENOSCOPY (EGD) WITH PROPOFOL  N/A 08/16/2019   Procedure: ESOPHAGOGASTRODUODENOSCOPY (EGD) WITH PROPOFOL ;  Surgeon: [name] Bi, MD;   Location: [facility] ENDOSCOPY;  Service: Gastroenterology;  Laterality: N/A;   FINGER AMPUTATION     HERNIA REPAIR Right    MRI Brain with and without contrast  03/20/2010   Suggestive of basal ganglia lacunar infarct   PROSTATECTOMY  11/16/1993   Abdominal   SKIN SURGERY     multiple   Thumb surgery       reports that he has quit smoking. His smoking use included cigarettes. His smokeless tobacco use includes chew. He reports that he does not currently use alcohol  after a past usage of about 5.0 standard drinks of alcohol  per week. He reports that he does not use drugs.  Allergies  Allergen Reactions   Lisinopril Swelling    Tongue swelling   Myrbetriq  [Mirabegron ] Swelling    Of the tongue   Beta Adrenergic Blockers     Other Reaction(s): Other (See Comments)  Symptomatic bradycardia Patient has been tolerating Propranolol  10 mg twice daily since 10/2022, with no complaints   Other Other (See Comments)    Symptomatic bradycardia    Triamterene     Other reaction(s): ITCHING,WATERING EYES  Other Reaction(s): ITCHING,WATERING EYES   Amlodipine Other (See Comments)    Peripheral edema   Atorvastatin Other (See Comments)    Dizziness and Fatigue   Dabigatran Etexilate Mesylate Other (See Comments)    Stomach ulcers (Pradaxa)   Dabigatran Etexilate Mesylate Other (See Comments)    Stomach ulcers (Pradaxa)   Latex Rash    Family History  Problem Relation Age of Onset   Heart disease Mother 49       CABG   Hypertension Mother    Heart attack Mother    Hypertension Brother    Heart disease Sister        stents   Prostate cancer Brother    Kidney cancer Brother    Skin cancer Brother    Hypertension Brother    Bladder Cancer Sister    Leukemia Father    Stroke Father        multiple   Cerebral aneurysm Sister    Kidney cancer Brother      Prior to Admission medications   Medication Sig Start Date End Date Taking? Authorizing Provider  apixaban  (ELIQUIS ) 2.5 MG  TABS tablet Take 1 tablet (2.5 mg total) by mouth 2 (two) times daily. 09/27/24 03/26/25  Von Bellis, MD  calcium -vitamin D  (OSCAL) 250-125 MG-UNIT per tablet Take 1 tablet by mouth 2 (two) times daily.     [provider]  cetirizine (ZYRTEC) 10 MG tablet Take 10 mg by mouth daily as needed for allergies.    [provider]  cyanocobalamin  1000 MCG tablet Take 1 tablet by mouth daily. 04/24/15   [provider]  diltiazem  (CARDIZEM  CD) 120 MG 24 hr capsule Take 120 mg by mouth daily. 06/09/22   [provider]  Ferrous Sulfate  134 MG TABS Take 1 tablet by mouth daily.    [provider]  flecainide  (TAMBOCOR ) 50 MG tablet Take 1 tablet (50 mg total) by mouth 2 (two) times daily. 07/15/17   Gollan, Timothy J, MD  gabapentin  (NEURONTIN ) 100 MG capsule Take one in the morning, one in the afternoon, and three  at night 01/16/24   [name], MD  GEMTESA  75 MG TABS Take 1 tablet (75 mg total) by mouth daily. 08/07/24   Vaillancourt, Samantha, PA-C  Leuprolide  Acetate, 6 Month, (LUPRON ) 45 MG injection Inject 45 mg into the muscle every 6 (six) months.    [provider]  losartan  (COZAAR ) 25 MG tablet Take 1 tablet by mouth 2 (two) times daily.    [provider]  meclizine  (ANTIVERT ) 25 MG tablet Take 1 tablet by mouth daily.    [provider]  omeprazole  (PRILOSEC) 40 MG capsule Take 40 mg by mouth daily. 09/02/23   [provider]  polyvinyl alcohol  (LIQUIFILM TEARS) 1.4 % ophthalmic solution 1 drop as needed.    [provider]  propranolol  (INDERAL ) 10 MG tablet Take 10 mg by mouth 2 (two) times daily. 09/15/24   [provider]  zolpidem  (AMBIEN ) 10 MG tablet TAKE ONE-HALF TO ONE TABLET AT BEDTIME. 09/16/24   [name], MD    Physical Exam: Vitals:   10/17/24 1153 10/17/24 1345 10/17/24 1414 10/17/24 1445  BP:  137/62  (!) 108/48  Pulse:  64  66  Resp:    16  Temp:      TempSrc:       SpO2:  97% 98% 98%  Weight: 68 kg     Height: 5' 9 (1.753 m)       Constitutional: NAD, calm, comfortable Vitals:   10/17/24 1153 10/17/24 1345 10/17/24 1414 10/17/24 1445  BP:  137/62  (!) 108/48  Pulse:  64  66  Resp:    16  Temp:      TempSrc:      SpO2:  97% 98% 98%  Weight: 68 kg     Height: 5' 9 (1.753 m)      Eyes: PERRL, lids and conjunctivae normal ENMT: Mucous membranes are moist. Posterior pharynx clear of any exudate or lesions.Normal dentition.  Neck: normal, supple, no masses, no thyromegaly Respiratory: clear to auscultation bilaterally, no wheezing, no crackles. Normal respiratory effort. No accessory muscle use.  Cardiovascular: Regular rate and rhythm, no murmurs / rubs / gallops. No extremity edema. 2+ pedal pulses. No carotid bruits.  Abdomen: no tenderness, no masses palpated. No hepatosplenomegaly. Bowel sounds positive.  Musculoskeletal: no clubbing / cyanosis. No joint deformity upper and lower extremities. Good ROM, no contractures. Normal muscle tone.  Skin: no rashes, lesions, ulcers. No induration Neurologic: CN 2-12 grossly intact. Sensation intact, DTR normal. Strength 5/5 in all 4.  Psychiatric: Normal judgment and insight. Alert and oriented x 3. Normal mood.    Labs on Admission: I have personally reviewed following labs and imaging studies  CBC: Recent Labs  Lab 10/17/24 1155  WBC 5.5  HGB 10.9*  HCT 34.0*  MCV 80.0  PLT 68*   Basic Metabolic Panel: Recent Labs  Lab 10/17/24 1155  NA 134*  K 4.2  CL 98  CO2 24  GLUCOSE 100*  BUN 14  CREATININE 0.66  CALCIUM  9.1   GFR: Estimated Creatinine Clearance: 67.3 mL/min (by C-G formula based on SCr of 0.66 mg/dL). Liver Function Tests: Recent Labs  Lab 10/17/24 1155  AST 19  ALT 6  ALKPHOS 89  BILITOT 1.1  PROT 7.0  ALBUMIN 3.9   No results for input(s): LIPASE, AMYLASE in the last 168 hours. No results for input(s): AMMONIA in the last 168 hours. Coagulation  Profile: Recent Labs  Lab 10/17/24 1411  INR 1.5*  Cardiac Enzymes: No results for input(s): CKTOTAL, CKMB, CKMBINDEX, TROPONINI in the last 168 hours. BNP (last 3 results) No results for input(s): PROBNP in the last 8760 hours. HbA1C: No results for input(s): HGBA1C in the last 72 hours. CBG: No results for input(s): GLUCAP in the last 168 hours. Lipid Profile: No results for input(s): CHOL, HDL, LDLCALC, TRIG, CHOLHDL, LDLDIRECT in the last 72 hours. Thyroid  Function Tests: No results for input(s): TSH, T4TOTAL, FREET4, T3FREE, THYROIDAB in the last 72 hours. Anemia Panel: No results for input(s): VITAMINB12, FOLATE, FERRITIN, TIBC, IRON, RETICCTPCT in the last 72 hours. Urine analysis:    Component Value Date/Time   COLORURINE AMBER (A) 09/21/2024 1735   APPEARANCEUR HAZY (A) 09/21/2024 1735   APPEARANCEUR Clear 10/14/2021 1354   LABSPEC 1.027 09/21/2024 1735   LABSPEC 1.009 08/10/2012 1219   PHURINE 5.0 09/21/2024 1735   GLUCOSEU NEGATIVE 09/21/2024 1735   GLUCOSEU Negative 08/10/2012 1219   HGBUR SMALL (A) 09/21/2024 1735   BILIRUBINUR NEGATIVE 09/21/2024 1735   BILIRUBINUR Negative 10/14/2021 1354   BILIRUBINUR Negative 08/10/2012 1219   KETONESUR NEGATIVE 09/21/2024 1735   PROTEINUR 100 (A) 09/21/2024 1735   UROBILINOGEN 0.2 04/27/2017 0949   NITRITE NEGATIVE 09/21/2024 1735   LEUKOCYTESUR NEGATIVE 09/21/2024 1735   LEUKOCYTESUR Negative 08/10/2012 1219    Radiological Exams on Admission: No results found.  EKG: Ordered  Assessment/Plan Principal Problem:   Lower GI bleed Active Problems:   Chronic anticoagulation   Portal vein thrombosis  (please populate well all problems here in Problem List. (For example, if patient is on BP meds at home and you resume or decide to hold them, it is a problem that needs to be her. 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 - Continue PPI - Unclear if there is any indication for Sandostatin given the fact that the rectal bleeding appears to be intermittent.  Will monitor H&H for now and decide Sandostatin dose later.  Recent right-sided colitis - Completed antibiotics - Will add probiotics  Portal vein thrombosis - Hold off Eliquis  as patient having GI bleed - Depends on GI workup result will decide whether to resume Eliquis .  PAF - In sinus rhythm - Continue flecainide  - Hold off Cardizem   HTN - Hold off Cardizem  and losartan  for now, until H&H is stabilized for 24 hours.  Cirrhosis secondary to alcohol  abuse Chronic thrombocytopenia - Patient reported has been sober - Platelet count stable - Hold off chemical DVT prophylaxis  DVT prophylaxis: SCD Code Status: Full code Family Communication: Sister at bedside Disposition Plan: Expect less than 2 midnight hospital stay. Consults called: GI Admission status: Telemetry observation   Cort ONEIDA Mana MD [facility] Pager [phone]  10/17/2024, 3:51 PM

## 2024-10-17 NOTE — ED Provider Notes (Signed)
 [facility] Provider Note    Event Date/Time   First MD Initiated Contact with Patient 10/17/24 1330     (approximate)   History   Rectal Bleeding   HPI  Ian Sanders is a 83 y.o. male with history of A-fib, cirrhosis, chronic thrombocytopenia who comes in with rectal bleeding.   09/21/2024 patient underwent CT imaging that did show colitis he did have portal vein and splenic vein thrombus and patient is currently on Eliquis .  Patient was treated with Zosyn .  Patient was found to have E. coli in stool was treated with ciprofloxacin .  Initially they had held anticoagulation given his history of chronic thrombocytopenia but patient was transition to Eliquis  2.5 twice daily  Patient reports having some bright red blood with wiping but then he placed a towel in his boxers and it was covered in bright red blood.  He stated this happened today.  He reports been compliant with Eliquis  he denies any abdominal pain any falls or hitting his head or any other concerns.  Physical Exam   Triage Vital Signs: ED Triage Vitals  Encounter Vitals Group     BP 10/17/24 1152 (!) 120/52     Girls Systolic BP Percentile --      Girls Diastolic BP Percentile --      Boys Systolic BP Percentile --      Boys Diastolic BP Percentile --      Pulse Rate 10/17/24 1152 (!) 57     Resp 10/17/24 1152 15     Temp 10/17/24 1152 99.1 F (37.3 C)     Temp Source 10/17/24 1152 Oral     SpO2 10/17/24 1152 95 %     Weight 10/17/24 1153 150 lb (68 kg)     Height 10/17/24 1153 5' 9 (1.753 m)     Head Circumference --      Peak Flow --      Pain Score 10/17/24 1153 0     Pain Loc --      Pain Education --      Exclude from Growth Chart --     Most recent vital signs: Vitals:   10/17/24 1152  BP: (!) 120/52  Pulse: (!) 57  Resp: 15  Temp: 99.1 F (37.3 C)  SpO2: 95%     General: Awake, no distress.  CV:  Good peripheral perfusion.  Resp:  Normal effort.  Abd:  No  distention.  Soft and nontender Other:  Yellowish stool with small amount of bright red blood noted   ED Results / Procedures / Treatments   Labs (all labs ordered are listed, but only abnormal results are displayed) Labs Reviewed  COMPREHENSIVE METABOLIC PANEL WITH GFR - Abnormal; Notable for the following components:      Result Value   Sodium 134 (*)    Glucose, Bld 100 (*)    All other components within normal limits  CBC - Abnormal; Notable for the following components:   Hemoglobin 10.9 (*)    HCT 34.0 (*)    MCH 25.6 (*)    Platelets 68 (*)    All other components within normal limits  POC OCCULT BLOOD, ED  TYPE AND SCREEN    PROCEDURES:  Critical Care performed: No  Procedures   MEDICATIONS ORDERED IN ED: Medications - No data to display   IMPRESSION / MDM / ASSESSMENT AND PLAN / ED COURSE  I reviewed the triage vital signs and the nursing notes.  Patient's presentation is most consistent with acute presentation with potential threat to life or bodily function.    Patient comes in with bright red blood per rectum in the setting of recently being started on anticoagulation for known splenic thrombosis, A-fib.  On examination his bleeding is very minimal do not feel that he needs a CT angio to look for any active bleeding.  His hemoglobin has slightly downtrended but is not as low as when he was initially in the hospital.  He does have known cirrhosis and so INR was ordered which was elevated at 1.5.  Do not believe this represents a cirrhotic bleed as he has got no melena on examination.  Given this will be complex to decide on what to do with his anticoagulation given known thrombus I will discuss possible team for admission to monitor rectal bleeding as well as to have further discussions with hematology about his anticoagulation.  He expressed understanding felt comfortable with this plan    FINAL CLINICAL IMPRESSION(S) / ED DIAGNOSES   Final diagnoses:   Rectal bleeding     Rx / DC Orders   ED Discharge Orders     None        Note:  This document was prepared using Dragon voice recognition software and may include unintentional dictation errors.   Ernest Ronal BRAVO, MD 10/17/24 [phone]

## 2024-10-18 DIAGNOSIS — K922 Gastrointestinal hemorrhage, unspecified: Secondary | ICD-10-CM | POA: Diagnosis not present

## 2024-10-18 DIAGNOSIS — K625 Hemorrhage of anus and rectum: Secondary | ICD-10-CM | POA: Diagnosis not present

## 2024-10-18 DIAGNOSIS — K641 Second degree hemorrhoids: Secondary | ICD-10-CM | POA: Diagnosis not present

## 2024-10-18 LAB — CBC
HCT: 31.1 % — ABNORMAL LOW (ref 39.0–52.0)
Hemoglobin: 9.9 g/dL — ABNORMAL LOW (ref 13.0–17.0)
MCH: 25.5 pg — ABNORMAL LOW (ref 26.0–34.0)
MCHC: 31.8 g/dL (ref 30.0–36.0)
MCV: 80.2 fL (ref 80.0–100.0)
Platelets: 47 K/uL — ABNORMAL LOW (ref 150–400)
RBC: 3.88 MIL/uL — ABNORMAL LOW (ref 4.22–5.81)
RDW: 13.5 % (ref 11.5–15.5)
WBC: 2.9 K/uL — ABNORMAL LOW (ref 4.0–10.5)
nRBC: 0 % (ref 0.0–0.2)

## 2024-10-18 LAB — PROTIME-INR
INR: 1.4 — ABNORMAL HIGH (ref 0.8–1.2)
Prothrombin Time: 18.4 s — ABNORMAL HIGH (ref 11.4–15.2)

## 2024-10-18 MED ORDER — FOLIC ACID 1 MG PO TABS
1.0000 mg | ORAL_TABLET | Freq: Every day | ORAL | Status: DC
  Administered 2024-10-18 – 2024-10-22 (×4): 1 mg via ORAL
  Filled 2024-10-18 (×4): qty 1

## 2024-10-18 MED ORDER — DICLOFENAC SODIUM 1 % EX GEL
2.0000 g | Freq: Four times a day (QID) | CUTANEOUS | Status: DC | PRN
  Administered 2024-10-19: 2 g via TOPICAL
  Filled 2024-10-18: qty 100

## 2024-10-18 MED ORDER — THIAMINE MONONITRATE 100 MG PO TABS
100.0000 mg | ORAL_TABLET | Freq: Every day | ORAL | Status: DC
  Administered 2024-10-18 – 2024-10-22 (×4): 100 mg via ORAL
  Filled 2024-10-18 (×4): qty 1

## 2024-10-18 NOTE — Hospital Course (Signed)
 PHU RECORD is a 83 y.o. 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  Of note patient was recently hospitalized for right-sided colitis and was noted to have a portal vein and splenic vein thrombosis.  At that time patient was treated with antibiotics Eliquis  but then patient continued to have right-sided abdominal cramps and loose bowel movements 6-7 times a day and was seen at GI clinic 1 day prior to presentation.  Subsequently patient noticed red blood while wiping and decided to come to the hospital.  In the ED patient was hemodynamically stable.  Hemoglobin was 10.9.  Compared to baseline 11.6, 2 weeks back.  Patient was then considered for admission to the hospital for further evaluation and treatment.  Assessment/Plan  Hematochezia Acute on chronic iron deficiency anemia GI has been consulted.  Hold Eliquis .  Continue PPI.  Hemoglobin today at 9.9.  Transfuse for for ongoing bleeding/hemoglobin less than 7.  Plan for colonoscopy on 10/19/2024.  Currently on clears.   Recent right-sided colitis Had completed antibiotics.  Continue probiotic  Portal vein thrombosis GI has been consulted.  Eliquis  on hold at this time due to GI bleed   Paroxysmal atrial fibrillation. In sinus rhythm.  Continue flecainide .  Currently Cardizem  on hold.  INR of 1.4.  Metoprolol  IV as well.   Essential hypertension Cardizem  and losartan  at home.  Currently on hold   Cirrhosis secondary to alcohol  abuse Chronic thrombocytopenia Hold off with DVT prophylaxis.  Patient states that he has quit drinking.  Will continue to monitor.

## 2024-10-18 NOTE — Progress Notes (Addendum)
 PROGRESS NOTE  Ian Sanders FMW:[id number] DOB: [birth year] DOA: 10/17/2024 PCP: [name], MD   LOS: 0 days   Brief narrative:   Ian Sanders is a 83 y.o. 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  Of note patient was recently hospitalized for right-sided colitis and was noted to have a portal vein and splenic vein thrombosis.  At that time patient was treated with antibiotics Eliquis  but then patient continued to have right-sided abdominal cramps and loose bowel movements 6-7 times a day and was seen at GI clinic 1 day prior to presentation.  Subsequently patient noticed red blood while wiping and decided to come to the hospital.  In the ED, patient was hemodynamically stable.  Hemoglobin was 10.9.  Compared to baseline 11.6, 2 weeks back.  Patient was then considered for admission to the hospital for further evaluation and treatment.     Assessment/Plan: Principal Problem:   Lower GI bleed Active Problems:   Chronic anticoagulation   Portal vein thrombosis  Hematochezia Acute on chronic iron deficiency anemia GI has been consulted.  Hold Eliquis .  Continue PPI.  Hemoglobin today at 9.9.  Transfuse for for ongoing bleeding/hemoglobin less than 7.  Plan for colonoscopy on 10/19/2024.  Currently on clears.   Recent right-sided colitis Had completed antibiotics.  Continue probiotic  Portal vein thrombosis GI has been consulted.  Eliquis  on hold at this time due to GI bleed   Paroxysmal atrial fibrillation. In sinus rhythm.  Continue flecainide .  Currently Cardizem  on hold.  INR of 1.4.  Metoprolol IV as well.   Essential hypertension Cardizem  and losartan  at home.  Currently on hold   Cirrhosis secondary to alcohol  abuse Chronic thrombocytopenia Pancytopenia. Hold off  with DVT prophylaxis.  Patient states that he has quit drinking.  Will continue to monitor.  Will continue to monitor closely.  DVT prophylaxis: SCDs Start: 10/17/24 1519   Disposition: Home likely in 1 to 2 days  Status is: Observation  The patient will require care spanning > 2 midnights and should be moved to inpatient because: GI workup pending, need for colonoscopy.    Code Status:     Code Status: Full Code  Family Communication: None at bedside.  I tried to reach out to the patient's spouse on the phone but was unable to reach out to her today.    Consultants: GI  Procedures: None  Anti-infectives:  None  Anti-infectives (From admission, onward)    None       Subjective: Today, patient was seen and examined at bedside.  Complains of bowel movement which is slightly reddish and some abdominal discomfort.  Complains of burning sensation on the foot.  Denies any shortness of breath dyspnea chest pain or palpitation.  Objective: Vitals:   10/18/24 0422 10/18/24 0730  BP: (!) 138/53 (!) 130/53  Pulse: 70 70  Resp: 17 19  Temp: 98.5 F (36.9 C) 98.7 F (37.1 C)  SpO2: 96% 97%    Intake/Output Summary (Last 24 hours) at 10/18/2024 1213 Last data filed at 10/18/2024 1025 Gross per 24 hour  Intake 420 ml  Output --  Net 420 ml   Filed Weights   10/17/24 1153  Weight: 68 kg   Body mass index is 22.15 kg/m.   Physical Exam: GENERAL: Patient is alert awake and oriented. Not in  obvious distress.  Elderly male, Communicative, HENT: Pallor noted.  Pupils equally reactive to light. Oral mucosa is moist NECK: is supple, no gross swelling noted. CHEST: Clear to auscultation. No crackles or wheezes.   CVS: S1 and S2 heard, no murmur. Regular rate and rhythm.  ABDOMEN: Soft, non-tender, bowel sounds are present. EXTREMITIES: No edema. CNS: Cranial nerves are intact. No focal motor deficits. SKIN: warm and dry without rashes.  Data Review: I have personally  reviewed the following laboratory data and studies,  CBC: Recent Labs  Lab 10/17/24 1155 10/17/24 2318 10/18/24 0559  WBC 5.5  --  2.9*  HGB 10.9* 9.7* 9.9*  HCT 34.0* 29.9* 31.1*  MCV 80.0  --  80.2  PLT 68*  --  47*   Basic Metabolic Panel: Recent Labs  Lab 10/17/24 1155  NA 134*  K 4.2  CL 98  CO2 24  GLUCOSE 100*  BUN 14  CREATININE 0.66  CALCIUM  9.1   Liver Function Tests: Recent Labs  Lab 10/17/24 1155  AST 19  ALT 6  ALKPHOS 89  BILITOT 1.1  PROT 7.0  ALBUMIN 3.9   No results for input(s): LIPASE, AMYLASE in the last 168 hours. No results for input(s): AMMONIA in the last 168 hours. Cardiac Enzymes: No results for input(s): CKTOTAL, CKMB, CKMBINDEX, TROPONINI in the last 168 hours. BNP (last 3 results) No results for input(s): BNP in the last 8760 hours.  ProBNP (last 3 results) No results for input(s): PROBNP in the last 8760 hours.  CBG: No results for input(s): GLUCAP in the last 168 hours. No results found for this or any previous visit (from the past 240 hours).   Studies: No results found.    Desma Wilkowski, MD  [facility] 10/18/2024  If 7PM-7AM, please contact night-coverage

## 2024-10-18 NOTE — Progress Notes (Signed)
    [name], MD [facility]   [address] [place], [place] [address] Phone: [phone] Fax : [phone]   Subjective: The patient denies knowing whether he has had any further bleeding.  The patient's hemoglobin has been stable.  The patient has been put on a clear liquid diet and is supposed to start a prep today for a colonoscopy for tomorrow.   Objective: Vital signs in last 24 hours: Vitals:   10/17/24 1930 10/17/24 1959 10/18/24 0422 10/18/24 0730  BP: (!) 120/52 (!) 135/52 (!) 138/53 (!) 130/53  Pulse: (!) 57 64 70 70  Resp: 16 17 17 19   Temp:  98.3 F (36.8 C) 98.5 F (36.9 C) 98.7 F (37.1 C)  TempSrc:  Oral Oral   SpO2: 98% 97% 96% 97%  Weight:      Height:       Weight change:   Intake/Output Summary (Last 24 hours) at 10/18/2024 1123 Last data filed at 10/18/2024 1025 Gross per 24 hour  Intake 420 ml  Output --  Net 420 ml     Exam: Heart:: Regular rate and rhythm or without murmur or extra heart sounds Lungs: normal and clear to auscultation and percussion Abdomen: soft, nontender, normal bowel sounds   Lab Results: @LABTEST2 @ Micro Results: No results found for this or any previous visit (from the past 240 hours). Studies/Results: No results found. Medications: I have reviewed the patient's current medications. Scheduled Meds:  acidophilus  1 capsule Oral TID   flecainide   50 mg Oral BID   gabapentin   100 mg Oral BID   gabapentin   300 mg Oral QHS   loratadine   10 mg Oral Daily   mirabegron  ER  25 mg Oral Daily   pantoprazole   40 mg Oral Daily   Continuous Infusions:  sodium chloride  20 mL/hr at 10/18/24 0700   PRN Meds:.diclofenac  Sodium, HYDROmorphone  (DILAUDID ) injection, meclizine , metoprolol tartrate, ondansetron  **OR** ondansetron  (ZOFRAN ) IV   Assessment: Principal Problem:   Lower GI bleed Active Problems:   Chronic anticoagulation   Portal vein thrombosis    Plan: The patient had rectal bleeding and was  admitted for the rectal bleeding.  The patient has been doing well without any complaints.  The patient will be getting a prep today and has been set up for colonoscopy for tomorrow.  The patient has been explained the plan and agrees with it.   LOS: 0 days   [name], MD.FACG 10/18/2024, 11:23 AM Pager [phone] 7am-5pm  Check AMION for 5pm -7am coverage and on weekends

## 2024-10-18 NOTE — Care Management Obs Status (Signed)
 MEDICARE OBSERVATION STATUS NOTIFICATION   Patient Details  Name: Ian Sanders MRN: [id number] Date of Birth: 06-21-1941   Medicare Observation Status Notification Given:  Chaney BRANDY CHRISTIANE LELON, CMA 10/18/2024, 11:23 AM

## 2024-10-18 NOTE — Plan of Care (Signed)
  Problem: Education: Goal: Knowledge of General Education information will improve Description: Including pain rating scale, medication(s)/side effects and non-pharmacologic comfort measures Outcome: Progressing   Problem: Health Behavior/Discharge Planning: Goal: Ability to manage health-related needs will improve Outcome: Progressing   Problem: Clinical Measurements: Goal: Ability to maintain clinical measurements within normal limits will improve Outcome: Progressing Goal: Will remain free from infection Outcome: Progressing   Problem: Safety: Goal: Ability to remain free from injury will improve Outcome: Progressing   

## 2024-10-19 ENCOUNTER — Observation Stay: Admitting: Anesthesiology

## 2024-10-19 ENCOUNTER — Encounter: Payer: Self-pay | Admitting: Internal Medicine

## 2024-10-19 ENCOUNTER — Encounter
Admission: EM | Disposition: A | Discharge status: Home / Self Care | Payer: Self-pay | Source: Home / Self Care | Attending: Emergency Medicine

## 2024-10-19 DIAGNOSIS — Z87891 Personal history of nicotine dependence: Secondary | ICD-10-CM | POA: Diagnosis not present

## 2024-10-19 DIAGNOSIS — K5150 Left sided colitis without complications: Secondary | ICD-10-CM

## 2024-10-19 DIAGNOSIS — K641 Second degree hemorrhoids: Secondary | ICD-10-CM

## 2024-10-19 DIAGNOSIS — K922 Gastrointestinal hemorrhage, unspecified: Secondary | ICD-10-CM | POA: Diagnosis not present

## 2024-10-19 DIAGNOSIS — K625 Hemorrhage of anus and rectum: Secondary | ICD-10-CM | POA: Diagnosis not present

## 2024-10-19 DIAGNOSIS — I10 Essential (primary) hypertension: Secondary | ICD-10-CM | POA: Diagnosis not present

## 2024-10-19 HISTORY — PX: COLONOSCOPY: SHX5424

## 2024-10-19 LAB — BASIC METABOLIC PANEL WITH GFR
Anion gap: 9 (ref 5–15)
BUN: 11 mg/dL (ref 8–23)
CO2: 25 mmol/L (ref 22–32)
Calcium: 8.3 mg/dL — ABNORMAL LOW (ref 8.9–10.3)
Chloride: 101 mmol/L (ref 98–111)
Creatinine, Ser: 0.57 mg/dL — ABNORMAL LOW (ref 0.61–1.24)
GFR, Estimated: >60 mL/min (ref >60)
Glucose, Bld: 93 mg/dL (ref 70–99)
Potassium: 3.6 mmol/L (ref 3.5–5.1)
Sodium: 135 mmol/L (ref 135–145)

## 2024-10-19 LAB — CBC
HCT: 28.1 % — ABNORMAL LOW (ref 39.0–52.0)
Hemoglobin: 9.3 g/dL — ABNORMAL LOW (ref 13.0–17.0)
MCH: 26.1 pg (ref 26.0–34.0)
MCHC: 33.1 g/dL (ref 30.0–36.0)
MCV: 78.9 fL — ABNORMAL LOW (ref 80.0–100.0)
Platelets: 46 K/uL — ABNORMAL LOW (ref 150–400)
RBC: 3.56 MIL/uL — ABNORMAL LOW (ref 4.22–5.81)
RDW: 13.4 % (ref 11.5–15.5)
WBC: 2.4 K/uL — ABNORMAL LOW (ref 4.0–10.5)
nRBC: 0 % (ref 0.0–0.2)

## 2024-10-19 LAB — MAGNESIUM: Magnesium: 1.6 mg/dL — ABNORMAL LOW (ref 1.7–2.4)

## 2024-10-19 SURGERY — COLONOSCOPY
Anesthesia: General

## 2024-10-19 MED ORDER — LOSARTAN POTASSIUM 25 MG PO TABS
25.0000 mg | ORAL_TABLET | Freq: Two times a day (BID) | ORAL | Status: DC
  Administered 2024-10-20 – 2024-10-22 (×5): 25 mg via ORAL
  Filled 2024-10-19 (×5): qty 1

## 2024-10-19 MED ORDER — VITAMIN B-12 1000 MCG PO TABS
1000.0000 ug | ORAL_TABLET | Freq: Every day | ORAL | Status: DC
  Administered 2024-10-19 – 2024-10-22 (×4): 1000 ug via ORAL
  Filled 2024-10-19 (×4): qty 1

## 2024-10-19 MED ORDER — APIXABAN 2.5 MG PO TABS
2.5000 mg | ORAL_TABLET | Freq: Two times a day (BID) | ORAL | Status: DC
  Administered 2024-10-20 – 2024-10-22 (×5): 2.5 mg via ORAL
  Filled 2024-10-19 (×5): qty 1

## 2024-10-19 MED ORDER — DILTIAZEM HCL ER COATED BEADS 120 MG PO CP24
120.0000 mg | ORAL_CAPSULE | Freq: Every day | ORAL | Status: DC
  Administered 2024-10-20 – 2024-10-22 (×3): 120 mg via ORAL
  Filled 2024-10-19 (×3): qty 1

## 2024-10-19 MED ORDER — MUSCLE RUB 10-15 % EX CREA
TOPICAL_CREAM | CUTANEOUS | Status: DC | PRN
  Filled 2024-10-19: qty 85

## 2024-10-19 MED ORDER — LIDOCAINE HCL (CARDIAC) PF 100 MG/5ML IV SOSY
PREFILLED_SYRINGE | INTRAVENOUS | Status: DC | PRN
  Administered 2024-10-19: 50 mg via INTRAVENOUS

## 2024-10-19 MED ORDER — MAGNESIUM SULFATE 2 GM/50ML IV SOLN
2.0000 g | Freq: Once | INTRAVENOUS | Status: AC
  Administered 2024-10-19: 2 g via INTRAVENOUS
  Filled 2024-10-19: qty 50

## 2024-10-19 MED ORDER — SODIUM CHLORIDE 0.9 % IV SOLN
INTRAVENOUS | Status: DC
  Administered 2024-10-19: 500 mL via INTRAVENOUS

## 2024-10-19 MED ORDER — PROPOFOL 500 MG/50ML IV EMUL
INTRAVENOUS | Status: DC | PRN
  Administered 2024-10-19: 150 ug/kg/min via INTRAVENOUS

## 2024-10-19 MED ORDER — OYSTER SHELL CALCIUM/D3 500-5 MG-MCG PO TABS
1.0000 | ORAL_TABLET | Freq: Two times a day (BID) | ORAL | Status: DC
  Administered 2024-10-19 – 2024-10-22 (×6): 1 via ORAL
  Filled 2024-10-19 (×6): qty 1

## 2024-10-19 MED ORDER — PROPOFOL 10 MG/ML IV BOLUS
INTRAVENOUS | Status: DC | PRN
  Administered 2024-10-19: 70 mg via INTRAVENOUS

## 2024-10-19 NOTE — Progress Notes (Addendum)
 PROGRESS NOTE  SENAN UREY FMW:[id number] DOB: November 30, 1940 DOA: 10/17/2024 PCP: [name], MD   LOS: 0 days   Brief narrative:   Ian Sanders is a 83 y.o. 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  Of note patient was recently hospitalized for right-sided colitis and was noted to have a portal vein and splenic vein thrombosis.  At that time patient was treated with antibiotics, Eliquis  but then patient continued to have right-sided abdominal cramps and loose bowel movements 6-7 times a day and was seen at GI clinic 1 day prior to presentation.  Subsequently, patient noticed red blood while wiping and decided to come to the hospital.  In the ED, patient was hemodynamically stable.  Hemoglobin was 10.9.  Compared to baseline 11.6, 2 weeks back.  Patient was then considered for admission to the hospital for further evaluation and treatment.     Assessment/Plan: Principal Problem:   Lower GI bleed Active Problems:   Blood in stool   Chronic anticoagulation   Portal vein thrombosis   Left sided ulcerative (chronic) colitis (HCC)  Hematochezia/Acute on chronic iron deficiency anemia GI onboard..  Hemoglobin today at 9.9.  Transfuse for for ongoing bleeding/hemoglobin less than 7.  Patient underwent colonoscopy on 10/19/2024 with findings of diffuse moderate inflammation in the rectum and rectosigmoid colon sigmoid colon and in the descending colon and random biopsies were taken.  Nonbleeding internal hemorrhoids were also found.  Will resume diet.  Communicated with GI Dr Jinny and stated that patient might be able to go back on Eliquis  from today.  Continue to hold off with Eliquis  for 1 more day..  GI recommends overnight observation.   Recent right-sided colitis Had completed  antibiotics.  Continue probiotic.  Colonoscopy with diffuse chronic inflammation.  Biopsies has been taken.  Portal vein thrombosis Eliquis  currently on hold.  Will resume from today.   Paroxysmal atrial fibrillation. In sinus rhythm.  Continue flecainide .  On IV metoprolol.  Will resume Cardizem  oral.  Plan to restart Eliquis  from tomorrow if platelets improving.  Hypomagnesemia.  Will replenish through IV.  Check levels in AM.  Magnesium  level of 1.6 today.   Essential hypertension Cardizem  and losartan  at home.  Will restart from today.   Cirrhosis secondary to alcohol  abuse Chronic thrombocytopenia Pancytopenia. Platelet count less than 46.  Continue to hold up with Eliquis  1 more day since thrombocytopenia.  Debility weakness.  Will get PT evaluation.  DVT prophylaxis: SCDs Start: 10/17/24 1519   Disposition: Home likely in 1 to 2 days  Status is: Observation  The patient will require care spanning > 2 midnights and should be moved to inpatient because: Status post colonoscopy with biopsy, closer monitoring,    Code Status:     Code Status: Full Code  Family Communication: None at bedside.  Tried to reach out to the patient' spouse again today but was unable to reach out to her   Consultants: GI  Procedures: Colonoscopy 10/19/2024  Anti-infectives:  None  Anti-infectives (From admission, onward)    None       Subjective: Today, patient was seen and examined at bedside.  Seen before for colonoscopy.  Had clear bowel movements on colonic preparation.  Denies any nausea vomiting fever shortness of breath dyspnea or chest pain   Objective: Vitals:   10/19/24 1211 10/19/24  1220  BP: (!) 119/55 (!) 123/44  Pulse: 69 68  Resp: 19 15  Temp:    SpO2: 97% 97%    Intake/Output Summary (Last 24 hours) at 10/19/2024 1234 Last data filed at 10/19/2024 0358 Gross per 24 hour  Intake 161.61 ml  Output --  Net 161.61 ml   Filed Weights   10/17/24 1153   Weight: 68 kg   Body mass index is 22.15 kg/m.   Physical Exam: GENERAL: Patient is alert awake and oriented. Not in obvious distress.  Elderly male, Communicative, HENT: Pallor noted.  Pupils equally reactive to light. Oral mucosa is moist NECK: is supple, no gross swelling noted. CHEST: Clear to auscultation. No crackles or wheezes.   CVS: S1 and S2 heard, no murmur. Regular rate and rhythm.  ABDOMEN: Soft, non-tender, bowel sounds are present. EXTREMITIES: No edema. CNS: Cranial nerves are intact. No focal motor deficits. SKIN: warm and dry without rashes.  Data Review: I have personally reviewed the following laboratory data and studies,  CBC: Recent Labs  Lab 10/17/24 1155 10/17/24 2318 10/18/24 0559 10/19/24 0514  WBC 5.5  --  2.9* 2.4*  HGB 10.9* 9.7* 9.9* 9.3*  HCT 34.0* 29.9* 31.1* 28.1*  MCV 80.0  --  80.2 78.9*  PLT 68*  --  47* 46*   Basic Metabolic Panel: Recent Labs  Lab 10/17/24 1155 10/19/24 0514  NA 134* 135  K 4.2 3.6  CL 98 101  CO2 24 25  GLUCOSE 100* 93  BUN 14 11  CREATININE 0.66 0.57*  CALCIUM  9.1 8.3*  MG  --  1.6*   Liver Function Tests: Recent Labs  Lab 10/17/24 1155  AST 19  ALT 6  ALKPHOS 89  BILITOT 1.1  PROT 7.0  ALBUMIN 3.9   No results for input(s): LIPASE, AMYLASE in the last 168 hours. No results for input(s): AMMONIA in the last 168 hours. Cardiac Enzymes: No results for input(s): CKTOTAL, CKMB, CKMBINDEX, TROPONINI in the last 168 hours. BNP (last 3 results) No results for input(s): BNP in the last 8760 hours.  ProBNP (last 3 results) No results for input(s): PROBNP in the last 8760 hours.  CBG: No results for input(s): GLUCAP in the last 168 hours. No results found for this or any previous visit (from the past 240 hours).   Studies: No results found.    Guy Seese, MD  [facility] 10/19/2024  If 7PM-7AM, please contact night-coverage

## 2024-10-19 NOTE — Op Note (Signed)
 [facility] Gastroenterology Patient Name: Ian Sanders Procedure Date: 10/19/2024 11:17 AM MRN: [id number] Account #: [id number] Date of Birth: 07-May-1941 Admit Type: Inpatient Age: 83 Room: [facility] ENDO ROOM 4 Gender: Male Note Status: Finalized Instrument Name: Colon Scope [id number] Procedure:             Colonoscopy Indications:           Hematochezia Providers:             [name] MD, MD Medicines:             Propofol  per Anesthesia Complications:         No immediate complications. Procedure:             Pre-Anesthesia Assessment:                        - Prior to the procedure, a History and Physical was                         performed, and patient medications and allergies were                         reviewed. The patient's tolerance of previous                         anesthesia was also reviewed. The risks and benefits                         of the procedure and the sedation options and risks                         were discussed with the patient. All questions were                         answered, and informed consent was obtained. Prior                         Anticoagulants: The patient has taken anticoagulant                         medication, last dose was 2 days prior to procedure.                         ASA Grade Assessment: III - A patient with severe                         systemic disease. After reviewing the risks and                         benefits, the patient was deemed in satisfactory                         condition to undergo the procedure.                        After obtaining informed consent, the colonoscope was                         passed under direct vision. Throughout the procedure,  the patient's blood pressure, pulse, and oxygen                         saturations were monitored continuously. The                         Colonoscope was introduced through the anus and                          advanced to the the cecum, identified by appendiceal                         orifice and ileocecal valve. The colonoscopy was                         performed without difficulty. The patient tolerated                         the procedure well. The quality of the bowel                         preparation was excellent. Findings:      The perianal and digital rectal examinations were normal.      Diffuse moderate inflammation was found in the rectum, in the       recto-sigmoid colon, in the sigmoid colon and in the descending colon.       Random biopsies were obtained with cold forceps for histology in the       entire colon.      Non-bleeding internal hemorrhoids were found during retroflexion. The       hemorrhoids were Grade II (internal hemorrhoids that prolapse but reduce       spontaneously). Impression:            - Diffuse moderate inflammation was found in the                         rectum, in the recto-sigmoid colon, in the sigmoid                         colon and in the descending colon secondary to                         left-sided colitis.                        - Non-bleeding internal hemorrhoids.                        - Random biopsies were obtained in the entire colon. Recommendation:        - Return patient to hospital ward for ongoing care.                        - Resume previous diet.                        - Continue present medications.                        - Await pathology results. Procedure Code(s):     ---  Professional ---                        [phone], Colonoscopy, flexible; with biopsy, single or                         multiple Diagnosis Code(s):     --- Professional ---                        K92.1, Melena (includes Hematochezia)                        K51.50, Left sided colitis without complications CPT copyright 2022 American Medical Association. All rights reserved. The codes documented in this report are preliminary and upon coder review may  be  revised to meet current compliance requirements. [name] MD, MD 10/19/2024 11:47:39 AM This report has been signed electronically. Number of Addenda: 0 Note Initiated On: 10/19/2024 11:17 AM Scope Withdrawal Time: 0 hours 4 minutes 45 seconds  Total Procedure Duration: 0 hours 11 minutes 14 seconds  Estimated Blood Loss:  Estimated blood loss: none.      [facility]

## 2024-10-19 NOTE — Plan of Care (Signed)
  Problem: Education: Goal: Knowledge of General Education information will improve Description: Including pain rating scale, medication(s)/side effects and non-pharmacologic comfort measures Outcome: Progressing   Problem: Clinical Measurements: Goal: Diagnostic test results will improve Outcome: Progressing   Problem: Activity: Goal: Risk for activity intolerance will decrease Outcome: Progressing   Problem: Coping: Goal: Level of anxiety will decrease Outcome: Progressing   Problem: Pain Managment: Goal: General experience of comfort will improve and/or be controlled Outcome: Progressing   Problem: Safety: Goal: Ability to remain free from injury will improve Outcome: Progressing

## 2024-10-19 NOTE — Transfer of Care (Signed)
 Immediate Anesthesia Transfer of Care Note  Patient: Ian Sanders  Procedure(s) Performed: COLONOSCOPY  Patient Location: Endoscopy Unit  Anesthesia Type:General  Level of Consciousness: sedated and drowsy  Airway & Oxygen Therapy: Patient Spontanous Breathing  Post-op Assessment: Report given to RN and Post -op Vital signs reviewed and stable  Post vital signs: Reviewed and stable  Last Vitals:  Vitals Value Taken Time  BP 104/41 10/19/24 11:46  Temp    Pulse 70 10/19/24 11:46  Resp 19 10/19/24 11:46  SpO2 98 % 10/19/24 11:46  Vitals shown include unfiled device data.  Last Pain:  Vitals:   10/19/24 1104  TempSrc: Temporal  PainSc: 0-No pain      Patients Stated Pain Goal: 0 (10/17/24 2028)  Complications: No notable events documented.

## 2024-10-19 NOTE — Anesthesia Preprocedure Evaluation (Addendum)
 Anesthesia Evaluation  Patient identified by MRN, date of birth, ID band Patient awake    Reviewed: Allergy & Precautions, H&P , NPO status , Patient's Chart, lab work & pertinent test results  Airway Mallampati: II  TM Distance: >3 FB Neck ROM: full    Dental  (+) Edentulous Lower,    Pulmonary former smoker   Pulmonary exam normal        Cardiovascular hypertension, + dysrhythmias (Paroxysmal) Atrial Fibrillation  Rhythm:Regular Rate:Normal     Neuro/Psych TIA negative psych ROS   GI/Hepatic negative GI ROS,,,(+) Cirrhosis       Splenomegaly   Endo/Other  negative endocrine ROS    Renal/GU negative Renal ROS  negative genitourinary   Musculoskeletal   Abdominal Normal abdominal exam  (+)   Peds  Hematology  (+) Blood dyscrasia, anemia thrombocytopenia   Anesthesia Other Findings PMH of HTN, hx of TIA/CVA, PAF not on chronic anticoagulation, cirrhosis, chronic thrombocytopenia, hx of prostate cancer s/p prostatectomy 1995, hx of traumatic subdural hematoma 2021, BPPV, and hx of basal call carcinoma  Hematochezia Acute on chronic iron deficiency anemia Recent right-sided colitis Portal vein thrombosis  Past Medical History: No date: Atrial fibrillation (HCC) No date: Basal cell carcinoma No date: Benign paroxysmal positional vertigo No date: Cancer (HCC) 09/18/2023: Cellulitis of left foot 09/15/2023: Closed fracture of distal end of left fibula with routine  healing 02/13/2020: Heart palpitations No date: History of chicken pox No date: History of measles No date: History of mumps No date: Hypertension No date: Mononeuritis of unspecified site No date: Other seborrheic keratosis 06/2020: Stroke [facility]) No date: Transient ischemic attack (TIA), 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) 06/25/2020: Traumatic subdural hematoma without loss of consciousness  (HCC) No date: Unspecified  hypothyroidism  Past Surgical History: 04/02/2010: Carotid Doppler Ultrasound     Comment:  Small amount Calcified plaque bilaterally, no               significant stenosis. 04/15/2021: CATARACT EXTRACTION W/PHACO; Right     Comment:  Procedure: CATARACT EXTRACTION PHACO AND INTRAOCULAR               LENS PLACEMENT (IOC) RIGHT;  Surgeon: Jaye Fallow,               MD;  Location: [facility];  Service:               Ophthalmology;  Laterality: Right;  7.98 0:47.2 05/20/2021: CATARACT EXTRACTION W/PHACO; Left     Comment:  Procedure: CATARACT EXTRACTION PHACO AND INTRAOCULAR               LENS PLACEMENT (IOC) LEFT 3.37 00:32.0;  Surgeon:               Jaye Fallow, MD;  Location: [facility];                Service: Ophthalmology;  Laterality: Left;  Requests               arrival 10A or after 05/11/2014: COLONOSCOPY     Comment:  Tubular Adenoma. Dr. Jinny 05/23/2018: COLONOSCOPY WITH PROPOFOL ; N/A     Comment:  Procedure: COLONOSCOPY WITH PROPOFOL ;  Surgeon: [name] Bi, MD;  Location: [facility] ENDOSCOPY;  Service:               Gastroenterology;  Laterality: N/A; 04/02/2010: DOPPLER ECHOCARDIOGRAPHY     Comment:  Mild left atrial dilation. Normal right atrium. No               valvular disease. No thrombus. Normal LV function. EF>55% 08/16/2019: ESOPHAGOGASTRODUODENOSCOPY (EGD) WITH PROPOFOL ; N/A     Comment:  Procedure: ESOPHAGOGASTRODUODENOSCOPY (EGD) WITH               PROPOFOL ;  Surgeon: [name] Bi, MD;  Location: [facility]               ENDOSCOPY;  Service: Gastroenterology;  Laterality: N/A; No date: FINGER AMPUTATION No date: HERNIA REPAIR; Right 03/20/2010: MRI Brain with and without contrast     Comment:  Suggestive of basal ganglia lacunar infarct 11/16/1993: PROSTATECTOMY     Comment:  Abdominal No date: SKIN SURGERY     Comment:  multiple No date: Thumb surgery  BMI    Body Mass Index: 22.15 kg/m       Reproductive/Obstetrics negative OB ROS                              Anesthesia Physical Anesthesia Plan  ASA: 3  Anesthesia Plan: General   Post-op Pain Management:    Induction:   PONV Risk Score and Plan: Propofol  infusion and TIVA  Airway Management Planned:   Additional Equipment:   Intra-op Plan:   Post-operative Plan:   Informed Consent: I have reviewed the patients History and Physical, chart, labs and discussed the procedure including the risks, benefits and alternatives for the proposed anesthesia with the patient or authorized representative who has indicated his/her understanding and acceptance.     Dental Advisory Given  Plan Discussed with: CRNA and Surgeon  Anesthesia Plan Comments:          Anesthesia Quick Evaluation

## 2024-10-19 NOTE — Plan of Care (Signed)
 ?  Problem: Clinical Measurements: ?Goal: Ability to maintain clinical measurements within normal limits will improve ?Outcome: Progressing ?Goal: Will remain free from infection ?Outcome: Progressing ?Goal: Diagnostic test results will improve ?Outcome: Progressing ?  ?

## 2024-10-20 DIAGNOSIS — K922 Gastrointestinal hemorrhage, unspecified: Secondary | ICD-10-CM | POA: Diagnosis not present

## 2024-10-20 LAB — BASIC METABOLIC PANEL WITH GFR
Anion gap: 9 (ref 5–15)
BUN: 8 mg/dL (ref 8–23)
CO2: 25 mmol/L (ref 22–32)
Calcium: 8.4 mg/dL — ABNORMAL LOW (ref 8.9–10.3)
Chloride: 102 mmol/L (ref 98–111)
Creatinine, Ser: 0.53 mg/dL — ABNORMAL LOW (ref 0.61–1.24)
GFR, Estimated: >60 mL/min (ref >60)
Glucose, Bld: 93 mg/dL (ref 70–99)
Potassium: 3.6 mmol/L (ref 3.5–5.1)
Sodium: 135 mmol/L (ref 135–145)

## 2024-10-20 LAB — CBC
HCT: 29.9 % — ABNORMAL LOW (ref 39.0–52.0)
Hemoglobin: 9.7 g/dL — ABNORMAL LOW (ref 13.0–17.0)
MCH: 26 pg (ref 26.0–34.0)
MCHC: 32.4 g/dL (ref 30.0–36.0)
MCV: 80.2 fL (ref 80.0–100.0)
Platelets: 52 K/uL — ABNORMAL LOW (ref 150–400)
RBC: 3.73 MIL/uL — ABNORMAL LOW (ref 4.22–5.81)
RDW: 13.2 % (ref 11.5–15.5)
WBC: 1.7 K/uL — ABNORMAL LOW (ref 4.0–10.5)
nRBC: 0 % (ref 0.0–0.2)

## 2024-10-20 LAB — MAGNESIUM: Magnesium: 1.8 mg/dL (ref 1.7–2.4)

## 2024-10-20 LAB — SURGICAL PATHOLOGY

## 2024-10-20 MED ORDER — METOPROLOL TARTRATE 5 MG/5ML IV SOLN: 5.0000 mg | INTRAVENOUS | Status: DC | PRN

## 2024-10-20 MED ORDER — ACETAMINOPHEN 325 MG PO TABS: 650.0000 mg | ORAL_TABLET | Freq: Four times a day (QID) | ORAL | Status: DC | PRN

## 2024-10-20 NOTE — Plan of Care (Signed)

## 2024-10-20 NOTE — Anesthesia Postprocedure Evaluation (Signed)
 Anesthesia Post Note  Patient: Ian Sanders  Procedure(s) Performed: COLONOSCOPY  Patient location during evaluation: Endoscopy Anesthesia Type: General Level of consciousness: awake and alert Pain management: pain level controlled Vital Signs Assessment: post-procedure vital signs reviewed and stable Respiratory status: spontaneous breathing, nonlabored ventilation and respiratory function stable Cardiovascular status: blood pressure returned to baseline and stable Postop Assessment: no apparent nausea or vomiting Anesthetic complications: no   No notable events documented.   Last Vitals:  Vitals:   10/20/24 0800 10/20/24 1002  BP: (!) 156/77 (!) 148/65  Pulse: (!) 132 96  Resp:  17  Temp: 37.2 C 36.9 C  SpO2: 92% 93%    Last Pain:  Vitals:   10/20/24 1002  TempSrc: Oral  PainSc:                  Camellia Merilee Louder

## 2024-10-20 NOTE — Evaluation (Signed)
 Physical Therapy Evaluation Patient Details Name: Ian Sanders MRN: [id number] DOB: [birth year] Today's Date: 10/20/2024  History of Present Illness  Ian Sanders is a 83 y.o. 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  Clinical Impression  Pt admitted with above diagnosis. Pt currently with functional limitations due to the deficits listed below (see PT Problem List). Pt received upright in bed agreeable to PT eval. Reports 2 falls since last admission but typically mod-I with RW and ADL's. Spouse assists with IADL's. Receives meals on wheels.   To date, pt with resting HR in 140's. Per NT pt received meds recently for HR control. Pt asymptomatic. Tremulous as baseline, willing to participate with close cardiac monitoring. Pt able to transfer to EoB with increased time, bed features. Pt requires minA+1 to stand and CGA to stand pivot to RW with heavy BUE support required on RW for stability. Pt endorsing fatigue with sitting. HR maintains 140's HR post transfer. Deferring mobility due to tachycardia. Due to recent falls and acute weakness, pt will benefit from skilled PT services < 3 hours/day to address deficits and maximize return to PLOF.       If plan is discharge home, recommend the following: A little help with walking and/or transfers;A little help with bathing/dressing/bathroom;Assistance with cooking/housework;Assist for transportation;Help with stairs or ramp for entrance   Can travel by private vehicle   Yes    Equipment Recommendations None recommended by PT  Recommendations for Other Services       Functional Status Assessment Patient has had a recent decline in their functional status and demonstrates the ability to make significant improvements in function in a reasonable and predictable  amount of time.     Precautions / Restrictions Precautions Precautions: Fall Recall of Precautions/Restrictions: Intact Precaution/Restrictions Comments: reports 2 falls in November sliding out of bed then recliner. Restrictions Weight Bearing Restrictions Per Provider Order: No      Mobility  Bed Mobility Overal bed mobility: Needs Assistance Bed Mobility: Supine to Sit     Supine to sit: Supervision, HOB elevated, Used rails       Patient Response: Cooperative  Transfers Overall transfer level: Needs assistance Equipment used: Rolling walker (2 wheels) Transfers: Sit to/from Stand, Bed to chair/wheelchair/BSC Sit to Stand: Min assist   Step pivot transfers: Contact guard assist       General transfer comment: Pt tremulous in UE's/LE's during t/f.    Ambulation/Gait               General Gait Details: Deferred gait due to elevated HR at rest in 140's  Stairs            Wheelchair Mobility     Tilt Bed Tilt Bed Patient Response: Cooperative  Modified Rankin (Stroke Patients Only)       Balance Overall balance assessment: Needs assistance Sitting-balance support: Feet supported Sitting balance-Leahy Scale: Good     Standing balance support: During functional activity, Bilateral upper extremity supported Standing balance-Leahy Scale: Poor Standing balance comment: heavy reliance on RW                             Pertinent Vitals/Pain Pain Assessment Pain Assessment: No/denies pain    Home Living Family/patient expects to be discharged to:: Private residence Living Arrangements: Spouse/significant other Available Help  at Discharge: Family;Available PRN/intermittently Type of Home: House Home Access: Stairs to enter;Ramped entrance Entrance Stairs-Rails: Right Entrance Stairs-Number of Steps: 3. Uses ramp to enter/exit home   Home Layout: One level Home Equipment: Agricultural [employer]ant (2 wheels);Shower seat;Wheelchair -  manual;Lift chair      Prior Function Prior Level of Function : Independent/Modified Independent;History of Falls (last six months);Needs assist             Mobility Comments: pt reports limited household ambulation with RW, hx of falls ADLs Comments: Pt reports indep with basic ADL, seated shower, and assist for IADL except medication mgt. Wife drives. Pt receives Meals on Wheels     Extremity/Trunk Assessment   Upper Extremity Assessment Upper Extremity Assessment: Defer to OT evaluation    Lower Extremity Assessment Lower Extremity Assessment: Generalized weakness    Cervical / Trunk Assessment Cervical / Trunk Assessment: Kyphotic  Communication   Communication Communication: Impaired Factors Affecting Communication: Hearing impaired    Cognition Arousal: Alert Behavior During Therapy: WFL for tasks assessed/performed   PT - Cognitive impairments: No apparent impairments                         Following commands: Intact       Cueing Cueing Techniques: Verbal cues     General Comments General comments (skin integrity, edema, etc.): HR in 140's at rest and with mobility.    Exercises     Assessment/Plan    PT Assessment Patient needs continued PT services  PT Problem List Decreased strength;Decreased activity tolerance;Decreased balance;Decreased mobility       PT Treatment Interventions Balance training;Gait training;Functional mobility training;Patient/family education;Therapeutic activities;Therapeutic exercise;DME instruction    PT Goals (Current goals can be found in the Care Plan section)  Acute Rehab PT Goals Patient Stated Goal: go to SNF to get stronger PT Goal Formulation: With patient Time For Goal Achievement: 11/03/24 Potential to Achieve Goals: Good    Frequency Min 3X/week     Co-evaluation               AM-PAC PT 6 Clicks Mobility  Outcome Measure Help needed turning from your back to your side while in a  flat bed without using bedrails?: A Little Help needed moving from lying on your back to sitting on the side of a flat bed without using bedrails?: A Little Help needed moving to and from a bed to a chair (including a wheelchair)?: A Little Help needed standing up from a chair using your arms (e.g., wheelchair or bedside chair)?: A Little Help needed to walk in hospital room?: A Lot Help needed climbing 3-5 steps with a railing? : Total 6 Click Score: 15    End of Session Equipment Utilized During Treatment: Gait belt Activity Tolerance: Patient tolerated treatment well Patient left: in chair;with call bell/phone within reach;with chair alarm set Nurse Communication: Mobility status PT Visit Diagnosis: Unsteadiness on feet (R26.81);Muscle weakness (generalized) (M62.81);Difficulty in walking, not elsewhere classified (R26.2)    Time: 9150-9090 PT Time Calculation (min) (ACUTE ONLY): 20 min   Charges:   PT Evaluation $PT Eval Low Complexity: 1 Low   PT General Charges $$ ACUTE PT VISIT: 1 Visit        Dorina HERO. Fairly IV, PT, DPT Physical Therapist- [place]  [facility] 10/20/2024, 10:42 AM

## 2024-10-20 NOTE — Progress Notes (Signed)
 [facility]  - [place] [facility]   PATIENT NAME: Ian Sanders    MR#:  [id number]  DATE OF BIRTH:  Jul 13, 1941  SUBJECTIVE:  no family at bedside patient tolerated breakfast well. Colonoscopy biopsy negative for G.I. Overall tolerating PO diet. No G.I. bleed noted.    VITALS:  Blood pressure 125/71, pulse 78, temperature 98.4 F (36.9 C), resp. rate 15, height 5' 9 (1.753 m), weight 68 kg, SpO2 93%.  PHYSICAL EXAMINATION:   GENERAL:  83 y.o.-year-old patient with no acute distress.  LUNGS: Normal breath sounds bilaterally, no wheezing CARDIOVASCULAR: S1, S2 normal. No murmur   ABDOMEN: Soft, nontender, nondistended. Bowel sounds present.  EXTREMITIES: No  edema b/l.    NEUROLOGIC: nonfocal  patient is alert and awake, weak debilitated LABORATORY PANEL:  CBC Recent Labs  Lab 10/20/24 0557  WBC 1.7*  HGB 9.7*  HCT 29.9*  PLT 52*    Chemistries  Recent Labs  Lab 10/17/24 1155 10/19/24 0514 10/20/24 0557  NA 134*   < > 135  K 4.2   < > 3.6  CL 98   < > 102  CO2 24   < > 25  GLUCOSE 100*   < > 93  BUN 14   < > 8  CREATININE 0.66   < > 0.53*  CALCIUM  9.1   < > 8.4*  MG  --    < > 1.8  AST 19  --   --   ALT 6  --   --   ALKPHOS 89  --   --   BILITOT 1.1  --   --    < > = values in this interval not displayed.   Assessment and Plan  KIYOSHI SCHAAB is a 83 y.o. 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  Of note patient was recently hospitalized for right-sided colitis and was noted to have a portal vein and splenic vein thrombosis.  At that time patient was treated with antibiotics, Eliquis  but then patient continued to have right-sided abdominal cramps and loose bowel movements 6-7 times a day and was seen at GI clinic 1 day prior to  presentation.  Subsequently, patient noticed red blood while wiping and decided to come to the hospital.   Hematochezia/Acute on chronic iron deficiency anemia GI onboard..  -- Hemoglobin today at 9.9.  -- Transfuse for for ongoing bleeding/hemoglobin less than 7.  -- Patient underwent colonoscopy on 10/19/2024 with findings of diffuse moderate inflammation in the rectum and rectosigmoid colon sigmoid colon and in the descending colon and random biopsies were taken.  Nonbleeding internal hemorrhoids were also found.   --Will resume diet.   --Per Dr Renate results are negative --resumed Eliquis --Dr VIONA aware   Recent right-sided colitis --Had completed antibiotics.  Continue probiotic.  Colonoscopy with diffuse chronic inflammation.  Biopsies has been taken.   Paroxysmal atrial fibrillation. --In sinus rhythm.  Continue flecainide .  On IV metoprolol .  Will resume Cardizem  oral.   --cont po eliquis    Hypomagnesemia.  Repleted Mag 1.8   Essential hypertension Cardizem  and losartan  at home.     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 Platelet count less than 46. Resumed eliquis --Dr VIONA aware  Debility weakness.   PT evaluation--recs rehab   DVT prophylaxis: SCDs   Procedures: Family  communication :none today Consults : G.I. CODE STATUS: full DVT Prophylaxis : eliquis  Level of care: Telemetry Status is: Observation The patient remains OBS appropriate and will d/c before 2 midnights.    TOTAL TIME TAKING CARE OF THIS PATIENT: 35 minutes.  >50% time spent on counselling and coordination of care  Note: This dictation was prepared with Dragon dictation along with smaller phrase technology. Any transcriptional errors that result from this process are unintentional.  Leita Blanch M.D    [facility]   CC: Primary care physician; [name], MD

## 2024-10-20 NOTE — [place] FL2 (Signed)
 [place]  MEDICAID FL2 LEVEL OF CARE FORM     IDENTIFICATION  Patient Name: Ian Sanders Birthdate: [birth year] Sex: male Admission Date (Current Location): 10/17/2024  [place] and [place] Number:  Chiropodist and Address:  [facility], [address], [place], [place] [address]      Provider Number: [id number]  Attending Physician Name and Address:  Tobie Calix, MD  Relative Name and Phone Number:       Current Level of Care: Hospital Recommended Level of Care: Skilled Nursing Facility Prior Approval Number:    Date Approved/Denied:   PASRR Number: [id number]  Discharge Plan: Home    Current Diagnoses: Patient Active Problem List   Diagnosis Date Noted   Left sided ulcerative (chronic) colitis (HCC) 10/19/2024   Lower GI bleed 10/17/2024   Portal vein thrombosis 10/17/2024   Colitis 09/21/2024   Generalized weakness 09/22/2023   Recurrent falls 09/22/2023   Balance problems 09/15/2023   Snoring 06/04/2022   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   Alcohol  abuse 12/23/2020   Other dyspnea and respiratory abnormality 12/23/2020   Prostate cancer (HCC) 09/12/2020   Heart murmur, systolic 08/19/2020   Pancytopenia (HCC) 07/17/2020   Splenomegaly 07/17/2020   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   Bradycardia 06/11/2020   Iron deficiency anemia due to chronic blood loss 09/16/2019   Chronic anticoagulation 09/16/2019   Thrombocytopenia 06/05/2019   History of adenomatous polyp of colon 05/02/2018   Paroxysmal atrial fibrillation (HCC) 07/14/2017   Low back pain 06/29/2016   Aortic atherosclerosis 05/05/2016   Cirrhosis of liver without ascites (HCC) 05/05/2016   Depression 04/17/2016   Regular alcohol  consumption 04/17/2016   Neuropathy 04/16/2016   Hernia, inguinal, right 04/16/2016   Blood in stool 04/16/2016   Tobacco abuse 04/16/2016   Urge incontinence 07/17/2015   Incontinence 06/20/2015   Prediabetes 12/19/2013    Hypertriglyceridemia 12/14/2011   HTN (hypertension) 12/14/2011   History of CVA (cerebrovascular accident) 03/21/2010   Headache 03/10/2010   Benign paroxysmal positional vertigo 01/15/2010   Insomnia 05/09/2009   Allergic rhinitis 05/09/2009   Esophageal reflux 05/09/2009   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/2009    Orientation RESPIRATION BLADDER Height & Weight     Self, Time, Situation, Place  Normal Continent Weight: 150 lb (68 kg) Height:  5' 9 (175.3 cm)  BEHAVIORAL SYMPTOMS/MOOD NEUROLOGICAL BOWEL NUTRITION STATUS      Continent Diet (Regular)  AMBULATORY STATUS COMMUNICATION OF NEEDS Skin   Limited Assist Verbally Normal                       Personal Care Assistance Level of Assistance  Bathing, Feeding, Dressing Bathing Assistance: Limited assistance Feeding assistance: Independent Dressing Assistance: Limited assistance     Functional Limitations Info  Sight, Hearing, Speech Sight Info: Impaired Hearing Info: Adequate Speech Info: Adequate    SPECIAL CARE FACTORS FREQUENCY  PT (By licensed PT), OT (By licensed OT)     PT Frequency: 5x/week OT Frequency: 5x/week            Contractures      Additional Factors Info  Code Status, Allergies Code Status Info: Full Allergies Info: Lisinopril, Myrbetriq  (Mirabegron ), Beta Adrenergic Blockers, Other, Triamterene, Amlodipine, Atorvastatin, Dabigatran Etexilate Mesylate, Dabigatran Etexilate Mesylate, Latex           Current Medications (10/20/2024):  This is the current hospital active medication list Current Facility-Administered Medications  Medication Dose Route Frequency Provider Last Rate Last Admin  acetaminophen  (TYLENOL ) tablet 650 mg  650 mg Oral Q6H PRN Patel, Sona, MD       acidophilus (RISAQUAD) capsule 1 capsule  1 capsule Oral TID Jinny Carmine, MD   1 capsule at 10/20/24 0810   apixaban  (ELIQUIS ) tablet 2.5 mg  2.5 mg Oral BID Pokhrel, Laxman, MD   2.5 mg at 10/20/24 9188    calcium -vitamin D  (OSCAL WITH D) 500-5 MG-MCG per tablet 1 tablet  1 tablet Oral BID WC Pokhrel, Laxman, MD   1 tablet at 10/20/24 9188   cyanocobalamin  (VITAMIN B12) tablet 1,000 mcg  1,000 mcg Oral Daily Pokhrel, Laxman, MD   1,000 mcg at 10/20/24 9188   diclofenac  Sodium (VOLTAREN ) 1 % topical gel 2 g  2 g Topical QID PRN Jinny Carmine, MD   2 g at 10/19/24 1231   diltiazem  (CARDIZEM  CD) 24 hr capsule 120 mg  120 mg Oral Daily Pokhrel, Laxman, MD   120 mg at 10/20/24 0810   flecainide  (TAMBOCOR ) tablet 50 mg  50 mg Oral BID Jinny Carmine, MD   50 mg at 10/20/24 9188   folic acid  (FOLVITE ) tablet 1 mg  1 mg Oral Daily Wohl, Darren, MD   1 mg at 10/20/24 9189   gabapentin  (NEURONTIN ) capsule 100 mg  100 mg Oral BID Jinny Carmine, MD   100 mg at 10/20/24 9188   gabapentin  (NEURONTIN ) capsule 300 mg  300 mg Oral QHS Jinny Carmine, MD   300 mg at 10/19/24 2134   HYDROmorphone  (DILAUDID ) injection 0.5-1 mg  0.5-1 mg Intravenous Q2H PRN Jinny Carmine, MD       loratadine  (CLARITIN ) tablet 10 mg  10 mg Oral Daily Wohl, Darren, MD   10 mg at 10/20/24 9189   losartan  (COZAAR ) tablet 25 mg  25 mg Oral BID Pokhrel, Laxman, MD   25 mg at 10/20/24 9188   meclizine  (ANTIVERT ) tablet 25 mg  25 mg Oral Daily PRN Jinny Carmine, MD       metoprolol  tartrate (LOPRESSOR ) injection 5 mg  5 mg Intravenous Q4H PRN Patel, Sona, MD       mirabegron  ER (MYRBETRIQ ) tablet 25 mg  25 mg Oral Daily Wohl, Darren, MD   25 mg at 10/20/24 9189   Muscle Rub CREA   Topical PRN Pokhrel, Laxman, MD       ondansetron  (ZOFRAN ) tablet 4 mg  4 mg Oral Q6H PRN Jinny Carmine, MD       Or   ondansetron  (ZOFRAN ) injection 4 mg  4 mg Intravenous Q6H PRN Jinny Carmine, MD       pantoprazole  (PROTONIX ) EC tablet 40 mg  40 mg Oral Daily Wohl, Darren, MD   40 mg at 10/20/24 9189   thiamine  (VITAMIN B1) tablet 100 mg  100 mg Oral Daily Jinny Carmine, MD   100 mg at 10/20/24 9188     Discharge Medications: Please see discharge summary for a list of  discharge medications.  Relevant Imaging Results:  Relevant Lab Results:   Additional Information SS# [id number]  Ian Sanders  [name], LCSW

## 2024-10-20 NOTE — TOC Progression Note (Addendum)
 Transition of Care [facility]) - Progression Note    Patient Details  Name: Ian Sanders MRN: [id number] Date of Birth: 1941-08-10  Transition of [facility]) CM/SW Contact  Alvaro Louder, [place] Phone Number: 10/20/2024, 11:48 AM  Clinical Narrative:    3:39 PM: Per [employer] they are requesting an updated PT note before starting auth for patient. Patient's heart rate was at 140 during PT Session. Once updated PT note with lower heart is in [facility] will start auth for [employer].    Patient selected SNF [facility]. Auth started with [employer] for patient to admit to [employer].   TOC to follow for discharge                    Expected Discharge Plan and Services                                               Social Drivers of Health (SDOH) Interventions SDOH Screenings   Food Insecurity: No Food Insecurity (10/17/2024)  Recent Concern: Food Insecurity - Food Insecurity Present (09/22/2024)  Housing: Low Risk  (10/17/2024)  Transportation Needs: No Transportation Needs (10/17/2024)  Utilities: Not At Risk (10/17/2024)  Alcohol  Screen: Low Risk  (01/18/2024)  Depression (PHQ2-9): Medium Risk (10/06/2024)  Financial Resource Strain: Low Risk  (10/16/2024)   Received from [facility]  Physical Activity: Insufficiently Active (01/18/2024)  Social Connections: Moderately Isolated (10/17/2024)  Stress: No Stress Concern Present (01/18/2024)  Tobacco Use: High Risk (10/19/2024)  Health Literacy: Adequate Health Literacy (01/18/2024)    Readmission Risk Interventions    09/27/2024    7:40 AM  Readmission Risk Prevention Plan  Transportation Screening Complete  PCP or Specialist Appt within 3-5 Days Complete  HRI or Home Care Consult Complete  Palliative Care Screening Not Applicable  Medication Review (RN Care Manager) Complete

## 2024-10-20 NOTE — TOC Initial Note (Signed)
 Transition of Care [facility]) - Initial/Assessment Note    Patient Details  Name: Ian Sanders MRN: [id number] Date of Birth: 1941-08-11  [facility]) CM/SW Contact:    Alvaro Louder, LCSW Phone Number: 10/20/2024, 10:55 AM  Clinical Narrative:     Per chart review patient is from home. PCP is Nancyann Perry LCSWA faxed out patient to SNF's in [place]. TOC to present facilities to patient at the bedside.            TOC to follow for discharge        Patient Goals and CMS Choice            Expected Discharge Plan and Services                                              Prior Living Arrangements/Services                       Activities of Daily Living   ADL Screening (condition at time of admission) Independently performs ADLs?: Yes (appropriate for developmental age) Is the patient deaf or have difficulty hearing?: Yes Does the patient have difficulty seeing, even when wearing glasses/contacts?: No Does the patient have difficulty concentrating, remembering, or making decisions?: No  Permission Sought/Granted                  Emotional Assessment              Admission diagnosis:  Rectal bleeding [K62.5] Lower GI bleed [K92.2] Patient Active Problem List   Diagnosis Date Noted   Left sided ulcerative (chronic) colitis (HCC) 10/19/2024   Lower GI bleed 10/17/2024   Portal vein thrombosis 10/17/2024   Colitis 09/21/2024   Generalized weakness 09/22/2023   Recurrent falls 09/22/2023   Balance problems 09/15/2023   Snoring 06/04/2022   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   Alcohol  abuse 12/23/2020   Other dyspnea and respiratory abnormality 12/23/2020   Prostate cancer (HCC) 09/12/2020   Heart murmur, systolic 08/19/2020   Pancytopenia (HCC) 07/17/2020   Splenomegaly 07/17/2020   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   Bradycardia 06/11/2020   Iron deficiency anemia due to chronic blood loss 09/16/2019    Chronic anticoagulation 09/16/2019   Thrombocytopenia 06/05/2019   History of adenomatous polyp of colon 05/02/2018   Paroxysmal atrial fibrillation (HCC) 07/14/2017   Low back pain 06/29/2016   Aortic atherosclerosis 05/05/2016   Cirrhosis of liver without ascites (HCC) 05/05/2016   Depression 04/17/2016   Regular alcohol  consumption 04/17/2016   Neuropathy 04/16/2016   Hernia, inguinal, right 04/16/2016   Blood in stool 04/16/2016   Tobacco abuse 04/16/2016   Urge incontinence 07/17/2015   Incontinence 06/20/2015   Prediabetes 12/19/2013   Hypertriglyceridemia 12/14/2011   HTN (hypertension) 12/14/2011   History of CVA (cerebrovascular accident) 03/21/2010   Headache 03/10/2010   Benign paroxysmal positional vertigo 01/15/2010   Insomnia 05/09/2009   Allergic rhinitis 05/09/2009   Esophageal reflux 05/09/2009   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 [place] [address] Phone: [phone] Fax:  [phone]  [id number] [place], [place] - [address] AT [facility] [address] [place] [address] Phone: [phone] Fax: [phone]  [facility] [address] [place] [address] Phone: [phone] Fax: [phone]     Social Drivers of Health (SDOH) Social History: SDOH Screenings   Food Insecurity: No Food Insecurity (10/17/2024)  Recent Concern: Food Insecurity - Food Insecurity Present (09/22/2024)  Housing: Low Risk  (10/17/2024)  Transportation Needs: No Transportation Needs (10/17/2024)  Utilities: Not At Risk (10/17/2024)  Alcohol  Screen: Low Risk  (01/18/2024)  Depression (PHQ2-9): Medium Risk (10/06/2024)  Financial Resource Strain: Low Risk   (10/16/2024)   Received from [facility]  Physical Activity: Insufficiently Active (01/18/2024)  Social Connections: Moderately Isolated (10/17/2024)  Stress: No Stress Concern Present (01/18/2024)  Tobacco Use: High Risk (10/19/2024)  Health Literacy: Adequate Health Literacy (01/18/2024)   SDOH Interventions:     Readmission Risk Interventions    09/27/2024    7:40 AM  Readmission Risk Prevention Plan  Transportation Screening Complete  PCP or Specialist Appt within 3-5 Days Complete  HRI or Home Care Consult Complete  Palliative Care Screening Not Applicable  Medication Review (RN Care Manager) Complete

## 2024-10-21 ENCOUNTER — Observation Stay

## 2024-10-21 DIAGNOSIS — R161 Splenomegaly, not elsewhere classified: Secondary | ICD-10-CM | POA: Diagnosis not present

## 2024-10-21 DIAGNOSIS — K922 Gastrointestinal hemorrhage, unspecified: Secondary | ICD-10-CM | POA: Diagnosis not present

## 2024-10-21 DIAGNOSIS — R188 Other ascites: Secondary | ICD-10-CM | POA: Diagnosis not present

## 2024-10-21 DIAGNOSIS — K7460 Unspecified cirrhosis of liver: Secondary | ICD-10-CM | POA: Diagnosis not present

## 2024-10-21 DIAGNOSIS — K7689 Other specified diseases of liver: Secondary | ICD-10-CM | POA: Diagnosis not present

## 2024-10-21 NOTE — Progress Notes (Signed)
 [facility]  - [place] [facility]   PATIENT NAME: Ian Sanders    MR#:  [id number]  DATE OF BIRTH:  1941/06/22  SUBJECTIVE:  no family at bedside Pt ambulated with PT around the nurses station. Pt reports feeling tight belly. Can I get fluid removed? patient tolerated breakfast well. Colonoscopy biopsy negative for G.I. Overall tolerating PO diet. No G.I. bleed noted.    VITALS:  Blood pressure (!) 123/48, pulse 70, temperature 98.3 F (36.8 C), resp. rate 18, height 5' 9 (1.753 m), weight 68 kg, SpO2 93%.  PHYSICAL EXAMINATION:   GENERAL:  83 y.o.-year-old patient with no acute distress.  LUNGS: Normal breath sounds bilaterally, no wheezing CARDIOVASCULAR: S1, S2 normal. No murmur   ABDOMEN: Soft, nontender, +distended.fluid thrill + EXTREMITIES: No  edema b/l.    NEUROLOGIC: nonfocal  patient is alert and awake, weak debilitated LABORATORY PANEL:  CBC Recent Labs  Lab 10/20/24 0557  WBC 1.7*  HGB 9.7*  HCT 29.9*  PLT 52*    Chemistries  Recent Labs  Lab 10/17/24 1155 10/19/24 0514 10/20/24 0557  NA 134*   < > 135  K 4.2   < > 3.6  CL 98   < > 102  CO2 24   < > 25  GLUCOSE 100*   < > 93  BUN 14   < > 8  CREATININE 0.66   < > 0.53*  CALCIUM  9.1   < > 8.4*  MG  --    < > 1.8  AST 19  --   --   ALT 6  --   --   ALKPHOS 89  --   --   BILITOT 1.1  --   --    < > = values in this interval not displayed.   Assessment and Plan  Ian Sanders is a 83 y.o. 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  Of note patient was recently hospitalized for right-sided colitis and was noted to have a portal vein and splenic vein thrombosis.  At that time patient was treated with antibiotics, Eliquis  but then patient continued to have right-sided  abdominal cramps and loose bowel movements 6-7 times a day and was seen at GI clinic 1 day prior to presentation.  Subsequently, patient noticed red blood while wiping and decided to come to the hospital.   Hematochezia/Acute on chronic iron deficiency anemia GI onboard..  -- Hemoglobin today at 9.9.  -- Transfuse for for ongoing bleeding/hemoglobin less than 7.  -- Patient underwent colonoscopy on 10/19/2024 with findings of diffuse moderate inflammation in the rectum and rectosigmoid colon sigmoid colon and in the descending colon and random biopsies were taken.  Nonbleeding internal hemorrhoids were also found.   --Will resume diet.   --Per Dr Renate results are negative --resumed Eliquis --Dr VIONA aware   Recent right-sided colitis --Had completed antibiotics.  Continue probiotic.  Colonoscopy with diffuse chronic inflammation.  Biopsies has been taken.   Paroxysmal atrial fibrillation. --In sinus rhythm.  Continue flecainide .  On IV metoprolol .  Will resume Cardizem  oral.   --cont po eliquis    Hypomagnesemia.  Repleted Mag 1.8   Essential hypertension Cardizem  and losartan  at home.     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 Platelet count less than 46. Resumed eliquis --Dr VIONA aware --  US  Ascites check--trace fluid--No need for paracentesis. D/w IR  Debility weakness.   PT evaluation--recs rehab. Pt ambulating fairly well. PT would like to ambulate later today in the afternoon. If does well will consider HH. Pt agreeable and can d/c in am 12/7   DVT prophylaxis: SCDs   Procedures: Family communication :none today Consults : G.I. CODE STATUS: full DVT Prophylaxis : eliquis  Level of care: Telemetry Status is: Observation The patient remains OBS appropriate and will d/c before 2 midnights.    TOTAL TIME TAKING CARE OF THIS PATIENT: 35 minutes.  >50% time spent on counselling and coordination of care  Note:  This dictation was prepared with Dragon dictation along with smaller phrase technology. Any transcriptional errors that result from this process are unintentional.  Leita Blanch M.D    [facility]   CC: Primary care physician; [name], MD

## 2024-10-21 NOTE — Plan of Care (Signed)
   Problem: Education: Goal: Knowledge of General Education information will improve Description Including pain rating scale, medication(s)/side effects and non-pharmacologic comfort measures Outcome: Progressing

## 2024-10-21 NOTE — Progress Notes (Signed)
 Physical Therapy Treatment Patient Details Name: Ian Sanders MRN: [id number] DOB: 1941/11/04 Today's Date: 10/21/2024   History of Present Illness GOERGE Sanders is a 83 y.o. 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    PT Comments  Pt was long sitting in bed upon arrival. He is A and O x 4. Greets chartered loss adjuster and remains cooperative throughout. Pt's resting HR 84bpm. Peak HR during session was 92bpm. He demonstrated safe performance of getting OOB without assist. Stood from lowest bed height to RW with supervision. He did require vcing for hand placement and technique improvements. Session progressed to ambulation ~135ft with RW. No overt LOB or safety concerns. Pt self limits distance. Unwilling to attempt stair training but did endorse having ramp entry to home. DC rec updated to reflect pt's progress. MD and dino discuss POC. Pt will benefit from post acute skilled PT to maximize his safety and independence with all ADLs.   If plan is discharge home, recommend the following: A little help with walking and/or transfers;A little help with bathing/dressing/bathroom;Assistance with cooking/housework;Help with stairs or ramp for entrance;Assist for transportation     Equipment Recommendations  None recommended by PT       Precautions / Restrictions Precautions Precautions: Fall Recall of Precautions/Restrictions: Intact Restrictions Weight Bearing Restrictions Per Provider Order: No     Mobility  Bed Mobility Overal bed mobility: Modified Independent Bed Mobility: Supine to Sit Supine to sit: Modified independent (Device/Increase time) General bed mobility comments: Pt had I'ly achieve EOB sitting while author was getting 2nd gown for modesty    Transfers Overall transfer level: Needs  assistance Equipment used: Rolling walker (2 wheels) Transfers: Sit to/from Stand Sit to Stand: Supervision  General transfer comment: Pt demonstrated safe abilities to stand from lowest bed height without physical assistance. vcs only for technique improvements.    Ambulation/Gait Ambulation/Gait assistance: Supervision Gait Distance (Feet): 120 Feet Assistive device: Rolling walker (2 wheels) Gait Pattern/deviations: Step-through pattern, Trunk flexed Gait velocity: decreased  General Gait Details: Pt's resting HR 84bpm. Peak HR during ambulation was 92bpm. Pt self limits distance however demonstrated safe steady gait kinatics without LOB or safety concerns. Encouraged continued use of RW at home. (used prior to admission)   Stairs Stairs:  (pt unwilling but has ramp entry for home access)       Balance Overall balance assessment: Needs assistance Sitting-balance support: Feet supported Sitting balance-Leahy Scale: Good     Standing balance support: Bilateral upper extremity supported, During functional activity, Reliant on assistive device for balance Standing balance-Leahy Scale: Fair Standing balance comment: No LOB observed with use of RW       Communication Communication Communication: Impaired Factors Affecting Communication: Hearing impaired  Cognition Arousal: Alert Behavior During Therapy: WFL for tasks assessed/performed   PT - Cognitive impairments: No apparent impairments      PT - Cognition Comments: Pt is A and O x4. Cooperative and motivated to participate Following commands: Intact      Cueing Cueing Techniques: Verbal cues     General Comments General comments (skin integrity, edema, etc.): Discussed post acute needs. Has spouse at home who provides transportation and will be available for 24/7 assistance at home. DC recs updated with discussion with pt and MD in room.      Pertinent Vitals/Pain Pain Assessment Pain Assessment: No/denies pain  Home Living                          Prior Function            PT Goals (current goals can now be found in the care plan section) Acute Rehab PT Goals Patient Stated Goal: get well Progress towards PT goals: Progressing toward goals    Frequency    Min 3X/week      PT Plan      Co-evaluation     PT goals addressed during session: Mobility/safety with mobility;Balance;Proper use of DME;Strengthening/ROM        AM-PAC PT 6 Clicks Mobility   Outcome Measure  Help needed turning from your back to your side while in a flat bed without using bedrails?: None Help needed moving from lying on your back to sitting on the side of a flat bed without using bedrails?: A Little Help needed moving to and from a bed to a chair (including a wheelchair)?: A Little Help needed standing up from a chair using your arms (e.g., wheelchair or bedside chair)?: A Little Help needed to walk in hospital room?: A Little Help needed climbing 3-5 steps with a railing? : A Little 6 Click Score: 19    End of Session   Activity Tolerance: Patient tolerated treatment well Patient left: in chair;with chair alarm set;with call bell/phone within reach Nurse Communication: Mobility status PT Visit Diagnosis: Unsteadiness on feet (R26.81);Muscle weakness (generalized) (M62.81);Difficulty in walking, not elsewhere classified (R26.2)     Time: 9053-8995 PT Time Calculation (min) (ACUTE ONLY): 18 min  Charges:    $Gait Training: 8-22 mins PT General Charges $$ ACUTE PT VISIT: 1 Visit                    Rankin Essex PTA 10/21/24, 3:47 PM

## 2024-10-21 NOTE — Progress Notes (Addendum)
 Mobility Specialist Progress Note:    10/21/24 1445  Mobility  Activity Ambulated with assistance  Level of Assistance Standby assist, set-up cues, supervision of patient - no hands on  Assistive Device Front wheel walker  Distance Ambulated (ft) 160 ft  Range of Motion/Exercises Active;All extremities  Activity Response Tolerated well  Mobility visit 1 Mobility  Mobility Specialist Start Time (ACUTE ONLY) 1432  Mobility Specialist Stop Time (ACUTE ONLY) 1445  Mobility Specialist Time Calculation (min) (ACUTE ONLY) 13 min   Pt received in bed, wife in room. Agreeable to mobility, required supervision to stand and ambulate with RW. Tolerated well, asx throughout. Returned to room, left pt semi fowlers with alarm on and belongings in reach. All needs met.  Sherrilee Ditty Mobility Specialist Please contact via Special Educational Needs Teacher or  Rehab office at [phone]

## 2024-10-22 DIAGNOSIS — K922 Gastrointestinal hemorrhage, unspecified: Secondary | ICD-10-CM | POA: Diagnosis not present

## 2024-10-22 NOTE — Discharge Summary (Signed)
 Physician Discharge Summary  Ian Sanders FMW:[id number] DOB: 1940-12-14 DOA: 10/17/2024  PCP: [name], MD  Admit date: 10/17/2024 Discharge date: 10/22/2024  Admitted From: home Disposition:  home w/ home health  Recommendations for Outpatient Follow-up:  Follow up with PCP in 1-2 weeks   Home Health: yes Equipment/Devices:  Discharge Condition: stable  CODE STATUS: full Diet recommendation:Regular   Brief/Interim Summary: HPI was taken from Dr. Laurita: Ian Sanders is a 83 y.o. 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   Patient was recently hospitalized 2 to 3 weeks ago for right-sided colitis, wound CT abdomen pelvis showed large amount of portal vein and splenic vein thrombosis.  Patient was treated with antibiotics, parenteral anticoagulation then switched to p.o. antibiotics and Eliquis  on discharge.  Patient reported still having frequent right sided abdominal crampings and loose bowel movement 6-7 times a day.  Denies any fever chills no nauseous vomiting.  He was seen by GI PA in the office yesterday and was told everything was fine.  Last night, patient had 1 loose brownish bowel movement.  When he wiped, he found bright red blood on the toilet paper and no blood in the toilet bowl.  Later in the evening, he also found his underwear stained with blood.  No tenesmus no fever or chills.  This morning he had another episode with bright red blood on the toilet paper after passing loose bowel movement.  Denies any chest pain shortness of breath palpitations.   ED Course: Afebrile, heart rate 64 blood pressure 108/48 O2 saturation 98% room air.  Blood work showed hemoglobin 10.9 compared to baseline 11.6 of 2 weeks ago, BUN 14 creatinine 0.6 bicarb 24.  ED physician who did rectal exam and  found brownish stool.    Discharge Diagnoses:  Principal Problem:   Lower GI bleed Active Problems:   Blood in stool   Chronic anticoagulation   Portal vein thrombosis   Left sided ulcerative (chronic) colitis (HCC) Hematochezia: s/p colonoscopy that showed diffuse moderate inflammation in the rectum and rectosigmoid colon sigmoid colon and in the descending colon and random biopsies were taken. Nonbleeding internal hemorrhoids were also found. Path was neg. Continue on eliquis .   Recent right-sided colitis: completed abx course. Continue on probiotic while inpatient   PAF: continue on cardizem , flecainide , eliquis .    Hypomagnesemia: repleated already    HTN: continue on cardizem , losartan      Alcoholic cirrhosis: continue w/ supportive care. 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 No need for a transfusion currently. Hx of chronic thrombocytopenia   Debility weakness: PT now recs HH.    Discharge Instructions  Discharge Instructions     Diet general   Complete by: As directed    Discharge instructions   Complete by: As directed    F/u w/ PCP in 1-2 weeks.   Increase activity slowly   Complete by: As directed       Allergies as of 10/22/2024       Reactions   Lisinopril Swelling   Tongue swelling   Myrbetriq  [mirabegron ] Swelling   Of the tongue   Beta Adrenergic Blockers    Other Reaction(s): Other (See Comments) Symptomatic bradycardia Patient has been tolerating Propranolol  10 mg twice  daily since 10/2022, with no complaints   Other Other (See Comments)   Symptomatic bradycardia    Triamterene    Other reaction(s): ITCHING,WATERING EYES Other Reaction(s): ITCHING,WATERING EYES   Amlodipine Other (See Comments)   Peripheral edema   Atorvastatin Other (See Comments)   Dizziness and Fatigue   Dabigatran Etexilate Mesylate Other (See  Comments)   Stomach ulcers (Pradaxa)   Dabigatran Etexilate Mesylate Other (See Comments)   Stomach ulcers (Pradaxa)   Latex Rash        Medication List     TAKE these medications    artificial tears ophthalmic solution 1 drop as needed.   calcium -vitamin D  250-125 MG-UNIT tablet Commonly known as: OSCAL Take 1 tablet by mouth 2 (two) times daily.   cetirizine 10 MG tablet Commonly known as: ZYRTEC Take 10 mg by mouth daily as needed for allergies.   cyanocobalamin  1000 MCG tablet Take 1 tablet by mouth daily.   diltiazem  120 MG 24 hr capsule Commonly known as: CARDIZEM  CD Take 120 mg by mouth daily.   Eliquis  2.5 MG Tabs tablet Generic drug: apixaban  Take 1 tablet (2.5 mg total) by mouth 2 (two) times daily.   Ferrous Sulfate  134 MG Tabs Take 1 tablet by mouth daily.   flecainide  50 MG tablet Commonly known as: TAMBOCOR  Take 1 tablet (50 mg total) by mouth 2 (two) times daily.   gabapentin  100 MG capsule Commonly known as: NEURONTIN  Take one in the morning, one in the afternoon, and three at night   Gemtesa  75 MG Tabs Generic drug: Vibegron  Take 1 tablet (75 mg total) by mouth daily.   leuprolide  (6 Month) 45 MG injection Commonly known as: LUPRON  Inject 45 mg into the muscle every 6 (six) months.   losartan  25 MG tablet Commonly known as: COZAAR  Take 1 tablet by mouth 2 (two) times daily.   meclizine  25 MG tablet Commonly known as: ANTIVERT  Take 1 tablet by mouth daily.   omeprazole  40 MG capsule Commonly known as: PRILOSEC Take 40 mg by mouth daily.   propranolol  10 MG tablet Commonly known as: INDERAL  Take 10 mg by mouth 2 (two) times daily.   zolpidem  10 MG tablet Commonly known as: AMBIEN  TAKE ONE-HALF TO ONE TABLET AT BEDTIME.        Contact information for after-discharge care     Destination     [facility] .   Service: Skilled Nursing Contact information: [address] [place]   [phone]             Home Medical Care     [facility] .   Service: Home Health Services Contact information: [address] [place]   [address] [phone]                    Allergies  Allergen Reactions   Lisinopril Swelling    Tongue swelling   Myrbetriq  [Mirabegron ] Swelling    Of the tongue   Beta Adrenergic Blockers     Other Reaction(s): Other (See Comments)  Symptomatic bradycardia Patient has been tolerating Propranolol  10 mg twice daily since 10/2022, with no complaints   Other Other (See Comments)    Symptomatic bradycardia    Triamterene     Other reaction(s): ITCHING,WATERING EYES  Other Reaction(s): ITCHING,WATERING EYES   Amlodipine Other (See Comments)    Peripheral edema   Atorvastatin Other (See Comments)    Dizziness and Fatigue  Dabigatran Etexilate Mesylate Other (See Comments)    Stomach ulcers (Pradaxa)   Dabigatran Etexilate Mesylate Other (See Comments)    Stomach ulcers (Pradaxa)   Latex Rash    Consultations: GI   Procedures/Studies: US  ASCITES (ABDOMEN LIMITED) Result Date: 10/21/2024 CLINICAL DATA:  356290 Ascites [id number] EXAM: LIMITED ABDOMEN ULTRASOUND FOR ASCITES TECHNIQUE: Limited ultrasound survey for ascites was performed in all four abdominal quadrants. COMPARISON:  CT AP, 09/21/2025.  US  ABDOMEN, 09/03/2023. FINDINGS: Focused ultrasound along the abdominal quadrants prior to planned paracentesis. Trace volume free fluid within the abdomen. Incidental gross nodular contour of imaged portions of the liver. Dependent echogenic gallstones with mild gallbladder distention with gallbladder wall thickening, likely edema. Splenomegaly, measuring up to 17 cm in greatest imaged dimension. IMPRESSION: 1. Trace volume of ascites within the imaged abdomen. 2. Cirrhotic morphology of liver. Recommend hepatoma surveillance with ultrasound every 6 months per AASLD  recommendation. Additional incidental, chronic and senescent findings as above. Electronically Signed   By: Thom Hall M.D.   On: 10/21/2024 11:50   (Echo, Carotid, EGD, Colonoscopy, ERCP)    Subjective: Pt c/o fatigue    Discharge Exam: Vitals:   10/22/24 0444 10/22/24 0756  BP: (!) 144/65 135/73  Pulse: 68 67  Resp: 18 18  Temp: 97.8 F (36.6 C) 97.6 F (36.4 C)  SpO2: 93% 97%   Vitals:   10/21/24 1553 10/21/24 2100 10/22/24 0444 10/22/24 0756  BP: (!) 124/58 (!) 129/58 (!) 144/65 135/73  Pulse: 66 65 68 67  Resp: 16 15 18 18   Temp: 98.1 F (36.7 C) 98.2 F (36.8 C) 97.8 F (36.6 C) 97.6 F (36.4 C)  TempSrc: Oral Oral    SpO2: 96% 95% 93% 97%  Weight:      Height:        General: Pt is alert, awake, not in acute distress Cardiovascular: S1/S2 +, no rubs, no gallops Respiratory: CTA bilaterally, no wheezing, no rhonchi Abdominal: Soft, NT, ND, bowel sounds + Extremities: no edema, no cyanosis    The results of significant diagnostics from this hospitalization (including imaging, microbiology, ancillary and laboratory) are listed below for reference.     Microbiology: No results found for this or any previous visit (from the past 240 hours).   Labs: BNP (last 3 results) No results for input(s): BNP in the last 8760 hours. Basic Metabolic Panel: Recent Labs  Lab 10/17/24 1155 10/19/24 0514 10/20/24 0557  NA 134* 135 135  K 4.2 3.6 3.6  CL 98 101 102  CO2 24 25 25   GLUCOSE 100* 93 93  BUN 14 11 8   CREATININE 0.66 0.57* 0.53*  CALCIUM  9.1 8.3* 8.4*  MG  --  1.6* 1.8   Liver Function Tests: Recent Labs  Lab 10/17/24 1155  AST 19  ALT 6  ALKPHOS 89  BILITOT 1.1  PROT 7.0  ALBUMIN 3.9   No results for input(s): LIPASE, AMYLASE in the last 168 hours. No results for input(s): AMMONIA in the last 168 hours. CBC: Recent Labs  Lab 10/17/24 1155 10/17/24 2318 10/18/24 0559 10/19/24 0514 10/20/24 0557  WBC 5.5  --  2.9* 2.4* 1.7*   HGB 10.9* 9.7* 9.9* 9.3* 9.7*  HCT 34.0* 29.9* 31.1* 28.1* 29.9*  MCV 80.0  --  80.2 78.9* 80.2  PLT 68*  --  47* 46* 52*   Cardiac Enzymes: No results for input(s): CKTOTAL, CKMB, CKMBINDEX, TROPONINI in the last 168 hours. BNP: Invalid input(s): POCBNP CBG: No results for input(s): GLUCAP  in the last 168 hours. D-Dimer No results for input(s): DDIMER in the last 72 hours. Hgb A1c No results for input(s): HGBA1C in the last 72 hours. Lipid Profile No results for input(s): CHOL, HDL, LDLCALC, TRIG, CHOLHDL, LDLDIRECT in the last 72 hours. Thyroid  function studies No results for input(s): TSH, T4TOTAL, T3FREE, THYROIDAB in the last 72 hours.  Invalid input(s): FREET3 Anemia work up No results for input(s): VITAMINB12, FOLATE, FERRITIN, TIBC, IRON, RETICCTPCT in the last 72 hours. Urinalysis    Component Value Date/Time   COLORURINE AMBER (A) 09/21/2024 1735   APPEARANCEUR HAZY (A) 09/21/2024 1735   APPEARANCEUR Clear 10/14/2021 1354   LABSPEC 1.027 09/21/2024 1735   LABSPEC 1.009 08/10/2012 1219   PHURINE 5.0 09/21/2024 1735   GLUCOSEU NEGATIVE 09/21/2024 1735   GLUCOSEU Negative 08/10/2012 1219   HGBUR SMALL (A) 09/21/2024 1735   BILIRUBINUR NEGATIVE 09/21/2024 1735   BILIRUBINUR Negative 10/14/2021 1354   BILIRUBINUR Negative 08/10/2012 1219   KETONESUR NEGATIVE 09/21/2024 1735   PROTEINUR 100 (A) 09/21/2024 1735   UROBILINOGEN 0.2 04/27/2017 0949   NITRITE NEGATIVE 09/21/2024 1735   LEUKOCYTESUR NEGATIVE 09/21/2024 1735   LEUKOCYTESUR Negative 08/10/2012 1219   Sepsis Labs Recent Labs  Lab 10/17/24 1155 10/18/24 0559 10/19/24 0514 10/20/24 0557  WBC 5.5 2.9* 2.4* 1.7*   Microbiology No results found for this or any previous visit (from the past 240 hours).   Time coordinating discharge: 33 minutes  SIGNED:   Anthony CHRISTELLA Pouch, MD  [facility] 10/22/2024, 1:10 PM Pager   If 7PM-7AM, please  contact night-coverage [email or web address]

## 2024-10-22 NOTE — Discharge Instructions (Signed)
 Home Health services will be provided by [facility] They will contact you within 48 hours after discharge. If you do not hear from them please give them a call at [phone].

## 2024-10-22 NOTE — TOC Progression Note (Signed)
 Transition of Care [facility]) - Progression Note    Patient Details  Name: Ian Sanders MRN: [id number] Date of Birth: 09/11/1941  Transition of [facility]) CM/SW Contact  Karan Inclan L Jordon [place], [place] Phone Number: 10/22/2024, 8:05 AM  Clinical Narrative:     Home Health orders placed. CSW sent requests in portal and [facility] accepted patient for physical therapy services.   CSW spoke with patient and he confirmed that he would like PT to be provided by [facility].   DME discussed. PT and OT have not made any recommendations. Patient advised that he could not think of any DME he would need. He stated that he has DME at home re: BSC, RW, cane etc.                     Expected Discharge Plan and Services                                               Social Drivers of Health (SDOH) Interventions SDOH Screenings   Food Insecurity: No Food Insecurity (10/17/2024)  Recent Concern: Food Insecurity - Food Insecurity Present (09/22/2024)  Housing: Low Risk  (10/17/2024)  Transportation Needs: No Transportation Needs (10/17/2024)  Utilities: Not At Risk (10/17/2024)  Alcohol  Screen: Low Risk  (01/18/2024)  Depression (PHQ2-9): Medium Risk (10/06/2024)  Financial Resource Strain: Low Risk  (10/16/2024)   Received from [facility]  Physical Activity: Insufficiently Active (01/18/2024)  Social Connections: Moderately Isolated (10/17/2024)  Stress: No Stress Concern Present (01/18/2024)  Tobacco Use: High Risk (10/19/2024)  Health Literacy: Adequate Health Literacy (01/18/2024)    Readmission Risk Interventions    09/27/2024    7:40 AM  Readmission Risk Prevention Plan  Transportation Screening Complete  PCP or Specialist Appt within 3-5 Days Complete  HRI or Home Care Consult Complete  Palliative Care Screening Not Applicable  Medication Review (RN Care Manager) Complete

## 2024-10-22 NOTE — Progress Notes (Signed)
 Physical Therapy Treatment Patient Details Name: Ian Sanders MRN: [id number] DOB: 1940-12-31 Today's Date: 10/22/2024   History of Present Illness Ian Sanders is a 83 y.o. 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    PT Comments  Pt is able to transition OOB to bathroom then complete x 1 lap on unit with slow but steady gait.  Excited for discharge and transition home.  No further questions or concerns voiced.   If plan is discharge home, recommend the following: A little help with walking and/or transfers;A little help with bathing/dressing/bathroom;Assistance with cooking/housework;Help with stairs or ramp for entrance;Assist for transportation   Can travel by private vehicle        Equipment Recommendations  None recommended by PT    Recommendations for Other Services       Precautions / Restrictions Precautions Precautions: Fall Recall of Precautions/Restrictions: Intact Precaution/Restrictions Comments: reports 2 falls in November sliding out of bed then recliner. Restrictions Weight Bearing Restrictions Per Provider Order: No     Mobility  Bed Mobility Overal bed mobility: Modified Independent               Patient Response: Cooperative  Transfers Overall transfer level: Needs assistance Equipment used: Rolling walker (2 wheels) Transfers: Sit to/from Stand Sit to Stand: Supervision                Ambulation/Gait Ambulation/Gait assistance: Supervision Gait Distance (Feet): 200 Feet Assistive device: Rolling walker (2 wheels) Gait Pattern/deviations: Step-through pattern, Trunk flexed Gait velocity: decreased     General Gait Details: slow but generally steady with good safety awareness   Stairs             Wheelchair Mobility     Tilt  Bed Tilt Bed Patient Response: Cooperative  Modified Rankin (Stroke Patients Only)       Balance Overall balance assessment: Needs assistance Sitting-balance support: Feet supported Sitting balance-Leahy Scale: Good     Standing balance support: Bilateral upper extremity supported, During functional activity, Reliant on assistive device for balance Standing balance-Leahy Scale: Fair Standing balance comment: No LOB observed with use of RW                            Communication Communication Communication: Impaired Factors Affecting Communication: Hearing impaired  Cognition Arousal: Alert Behavior During Therapy: WFL for tasks assessed/performed                           PT - Cognition Comments: Pt is A and O x4. Cooperative and motivated to participate Following commands: Intact      Cueing Cueing Techniques: Verbal cues  Exercises      General Comments        Pertinent Vitals/Pain Pain Assessment Pain Assessment: Faces Faces Pain Scale: Hurts a little bit Pain Location: hip/LE Pain Descriptors / Indicators: Sore Pain Intervention(s): Limited activity within patient's tolerance, Monitored during session, Repositioned    Home Living                          Prior Function            PT Goals (current goals can now be found in the care plan section) Progress towards PT goals: Progressing  toward goals    Frequency    Min 3X/week      PT Plan      Co-evaluation              AM-PAC PT 6 Clicks Mobility   Outcome Measure  Help needed turning from your back to your side while in a flat bed without using bedrails?: None Help needed moving from lying on your back to sitting on the side of a flat bed without using bedrails?: None Help needed moving to and from a bed to a chair (including a wheelchair)?: A Little Help needed standing up from a chair using your arms (e.g., wheelchair or bedside chair)?: A  Little Help needed to walk in hospital room?: A Little Help needed climbing 3-5 steps with a railing? : A Little 6 Click Score: 20    End of Session Equipment Utilized During Treatment: Gait belt Activity Tolerance: Patient tolerated treatment well Patient left: in chair;with chair alarm set;with call bell/phone within reach Nurse Communication: Mobility status PT Visit Diagnosis: Unsteadiness on feet (R26.81);Muscle weakness (generalized) (M62.81);Difficulty in walking, not elsewhere classified (R26.2)     Time: 9158-9141 PT Time Calculation (min) (ACUTE ONLY): 17 min  Charges:    $Gait Training: 8-22 mins PT General Charges $$ ACUTE PT VISIT: 1 Visit                   Lauraine Gills, PTA 10/22/24, 9:52 AM

## 2024-10-22 NOTE — Progress Notes (Signed)
 Patient discharged to home. Dc instructions given with male family member and son at bedside. All of patient and family's questions addressed appropriately. Patient left unit in wheelchair pushed by this writer accompanied by son. Left in stable condition. No changes made to medications.

## 2024-10-22 NOTE — Plan of Care (Signed)

## 2024-10-23 ENCOUNTER — Telehealth: Payer: Self-pay

## 2024-10-23 NOTE — Transitions of Care (Post Inpatient/ED Visit) (Signed)
 10/23/2024  Name: Ian Sanders MRN: [id number] DOB: November 12, 1941  Today's TOC FU Call Status: Today's TOC FU Call Status:: Successful TOC FU Call Completed TOC FU Call Complete Date: 10/23/24  Patient's Name and Date of Birth confirmed. Name, DOB  Transition Care Management Follow-up Telephone Call Date of Discharge: 10/22/24 Discharge Facility: [facility]) Type of Discharge: Inpatient Admission Primary Inpatient Discharge Diagnosis:: GI bleed How have you been since you were released from the hospital?: Better Any questions or concerns?: No  Items Reviewed: Did you receive and understand the discharge instructions provided?: Yes Medications obtained,verified, and reconciled?: Yes (Medications Reviewed) Any new allergies since your discharge?: No Dietary orders reviewed?: Yes Do you have support at home?: Yes People in Home [RPT]: spouse  Medications Reviewed Today: Medications Reviewed Today     Reviewed by Emmitt Pan, LPN (Licensed Practical Nurse) on 10/23/24 at 1438  Med List Status: <None>   Medication Order Taking? Sig Documenting Provider Last Dose Status Informant  apixaban  (ELIQUIS ) 2.5 MG TABS tablet [id number] Yes Take 1 tablet (2.5 mg total) by mouth 2 (two) times daily. Von Bellis, MD  Active Self, Pharmacy Records  calcium -vitamin D  [employer]) 250-125 MG-UNIT per tablet [id number] Yes Take 1 tablet by mouth 2 (two) times daily.  [provider]  Active Self, Pharmacy Records           Med Note JOYICE, ILEANA JULIANNA Repress Jul 13, 2021  1:13 AM)    cetirizine (ZYRTEC) 10 MG tablet [id number] Yes Take 10 mg by mouth daily as needed for allergies. [provider]  Active Pharmacy Records, Self  cyanocobalamin  1000 MCG tablet [id number] Yes Take 1 tablet by mouth daily. [provider]  Active Self, Pharmacy Records  diltiazem  (CARDIZEM  CD) 120 MG 24 hr capsule [id number] Yes Take 120 mg by mouth daily. [provider]  Active Self, Pharmacy Records  Ferrous Sulfate  134 MG TABS [id number] Yes Take 1 tablet by mouth daily. [provider]  Active Self, Pharmacy Records  flecainide  (TAMBOCOR ) 50 MG tablet [id number] Yes Take 1 tablet (50 mg total) by mouth 2 (two) times daily. Gollan, Timothy J, MD  Active Self, Pharmacy Records  gabapentin  (NEURONTIN ) 100 MG capsule [id number] Yes Take one in the morning, one in the afternoon, and three at night [name], MD  Active Pharmacy Records, Self  GEMTESA  75 MG TABS [id number] Yes Take 1 tablet (75 mg total) by mouth daily. Vaillancourt, Samantha, PA-C  Active Pharmacy Records, Self  Leuprolide  Acetate, 6 Month, (LUPRON ) 45 MG injection [id number] Yes Inject 45 mg into the muscle every 6 (six) months. [provider]  Active Self, Pharmacy Records  losartan  (COZAAR ) 25 MG tablet [id number] Yes Take 1 tablet by mouth 2 (two) times daily. [provider]  Active Self, Pharmacy Records  meclizine  (ANTIVERT ) 25 MG tablet [id number] Yes Take 1 tablet by mouth daily. [provider]  Active Self, Pharmacy Records           Med Note Liberal, RICHERD CINDERELLA [place] Oct 04, 2024 10:41 AM) Taking as needed  omeprazole  (PRILOSEC) 40 MG capsule [id number] Yes Take 40 mg by mouth daily. [provider]  Active Self, Pharmacy Records  polyvinyl alcohol  (LIQUIFILM TEARS) 1.4 % ophthalmic solution [id number] Yes 1 drop as needed. [provider]  Active Self, Pharmacy Records  propranolol  (INDERAL ) 10 MG tablet [id number] Yes Take 10 mg by mouth 2 (two) times daily. [provider]  Active Pharmacy Records, Self  zolpidem  (AMBIEN ) 10 MG tablet [id number] Yes TAKE ONE-HALF TO ONE TABLET AT BEDTIME. [name], MD  Active Pharmacy Records, Self            Home Care and Equipment/Supplies: Were Home Health Services Ordered?: Yes Name of Home Health Agency:: [facility] Has Agency set up a time to come to your home?:  No Any new equipment or medical supplies ordered?: NA  Functional Questionnaire: Do you need assistance with bathing/showering or dressing?: No Do you need assistance with meal preparation?: No Do you need assistance with eating?: No Do you have difficulty maintaining continence: No Do you need assistance with getting out of bed/getting out of a chair/moving?: No Do you have difficulty managing or taking your medications?: No  Follow up appointments reviewed: PCP Follow-up appointment confirmed?: No (declined) MD Provider Line Number:[phone] Given: No Specialist Hospital Follow-up appointment confirmed?: Yes Date of Specialist follow-up appointment?: 10/31/24 Follow-Up Specialty Provider:: GI Do you need transportation to your follow-up appointment?: No Do you understand care options if your condition(s) worsen?: Yes-patient verbalized understanding    SIGNATURE Julian Lemmings, LPN [facility] Nurse Health Advisor Direct Dial [phone]

## 2024-10-24 ENCOUNTER — Telehealth: Payer: Self-pay

## 2024-10-24 NOTE — Telephone Encounter (Signed)
 The patient, who is currently on our schedule, called requesting to reschedule his appointment. He stated that he was seen at [facility] on 10/16/24 and was sent directly from the doctor's office to the hospital for a procedure. This appointment with our office was originally scheduled on 10/20/24 at 10:14 AM.  I informed the patient that Dr. Heriberto nurse will contact him regarding his scheduling needs and next steps.

## 2024-10-28 ENCOUNTER — Other Ambulatory Visit: Payer: Self-pay

## 2024-10-28 ENCOUNTER — Other Ambulatory Visit (HOSPITAL_COMMUNITY): Payer: Self-pay

## 2024-10-29 ENCOUNTER — Other Ambulatory Visit (HOSPITAL_COMMUNITY): Payer: Self-pay

## 2024-10-30 ENCOUNTER — Telehealth: Payer: Self-pay

## 2024-10-30 NOTE — Telephone Encounter (Signed)
 Copied from CRM #[id number]. Topic: Clinical - Home Health Verbal Orders >> Oct 30, 2024  1:04 PM Corin V wrote: Caller/Agency: [facility] Callback Number: [phone] Service Requested: Physical Therapy Frequency: 1 week 2 then 2 week 2 Any new concerns about the patient? No

## 2024-10-30 NOTE — Telephone Encounter (Signed)
 Verbal okay given per Dr.Fisher to [facility] with [facility].

## 2024-10-30 NOTE — Telephone Encounter (Signed)
 That's fine

## 2024-10-31 ENCOUNTER — Ambulatory Visit: Admitting: Gastroenterology

## 2024-11-03 ENCOUNTER — Ambulatory Visit: Admitting: Family Medicine

## 2024-11-03 ENCOUNTER — Encounter: Payer: Self-pay | Admitting: Family Medicine

## 2024-11-03 ENCOUNTER — Other Ambulatory Visit: Payer: Self-pay

## 2024-11-03 VITALS — BP 131/60 | HR 66 | Temp 97.9°F | Ht 69.0 in | Wt 141.0 lb

## 2024-11-03 DIAGNOSIS — K922 Gastrointestinal hemorrhage, unspecified: Secondary | ICD-10-CM

## 2024-11-03 DIAGNOSIS — K529 Noninfective gastroenteritis and colitis, unspecified: Secondary | ICD-10-CM | POA: Diagnosis not present

## 2024-11-03 DIAGNOSIS — I81 Portal vein thrombosis: Secondary | ICD-10-CM

## 2024-11-03 MED ORDER — LUBIPROSTONE 24 MCG PO CAPS
24.0000 ug | ORAL_CAPSULE | Freq: Two times a day (BID) | ORAL | 1 refills | Status: AC
  Filled 2024-11-03 – 2024-11-07 (×2): qty 90, 45d supply, fill #0

## 2024-11-03 NOTE — Progress Notes (Signed)
 "     Established patient visit   Patient: Ian Sanders   DOB: [birth year]   83 y.o. Male  MRN: [id number] Visit Date: 11/03/2024  Today's healthcare provider: Nancyann Perry, MD   Chief Complaint  Patient presents with   Follow-up    HFU 12/2-12/7 dx blood in stool, colonoscopy done and f/u with GI scheduled on 11/30/2024    Subjective    Discussed the use of AI scribe software for clinical note transcription with the patient, who gave verbal consent to proceed.  History of Present Illness   Ian Sanders is an 83 year old male with left-sided colitis who presents for a hospital follow-up after a lower GI bleed.  He was admitted to the hospital on December 2nd for a lower GI bleed and discharged on December 7th. During his hospitalization, a colonoscopy revealed diffuse moderate inflammation in the rectum, rectosigmoid colon, sigmoid colon, and descending colon. His hemoglobin was 9.7 at discharge. He currently experiences some rectal bleeding, though he feels it is not significant.  He is on a regular diet but avoids spicy foods and consumes plenty of fiber and water to prevent constipation. He takes Miralax  daily for constipation. Previously, he used magnesium  citrate but discontinued it due to its unpleasant taste. He has not used any prescription medications for constipation until now. He recalls an emergency room visit for constipation where he was advised to take half a dose of Miralax  with water every hour until relief was achieved, which took about a day and a half. Constipation remains a recurring issue.  He is scheduled to follow up with his cardiologist on January 15th. He mentions a previous prescription for Eliquis  2.5 mg for a liver thrombosis, which was initiated in November. He has a history of a blood clot in the liver and an inactive blood clot in the brain.  During his hospital stay, his magnesium  levels were low, and he received magnesium  supplementation. He is not  currently taking any magnesium  supplements. He anticipates having his magnesium  levels and blood counts rechecked during his upcoming appointment.       Medications: Show/hide medication list[1] Review of Systems  Constitutional:  Negative for appetite change, chills and fever.  Respiratory:  Negative for chest tightness, shortness of breath and wheezing.   Cardiovascular:  Negative for chest pain and palpitations.  Gastrointestinal:  Positive for constipation. Negative for abdominal pain, blood in stool, diarrhea, nausea and vomiting.       Objective    BP 131/60 (BP Location: Left Arm, Patient Position: Sitting, Cuff Size: Normal)   Pulse 66   Temp 97.9 F (36.6 C) (Oral)   Ht 5' 9 (1.753 m)   Wt 141 lb (64 kg)   SpO2 99%   BMI 20.82 kg/m   Physical Exam   General appearance: Well developed, well nourished male, cooperative and in no acute distress Head: Normocephalic, without obvious abnormality, atraumatic Respiratory: Respirations even and unlabored, normal respiratory rate Skin: Skin color, texture, turgor normal. No rashes seen  Psych: Appropriate mood and affect. Neurologic: Mental status: Alert, oriented to person, place, and time, thought content appropriate.    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 Colonoscopy showed diffuse moderate inflammation in the rectum, rectosigmoid colon, sigmoid colon, and descending colon. Hemoglobin was 9.7 at discharge. Persistent inflammation likely due to healing process post-hospitalization. No active  infection noted. Constipation management is crucial to prevent complications. - Advised high fiber diet and adequate hydration to prevent constipation. - Prescribed Amitiza 24mg  every day for constipation management, to be taken in addition to Miralax . - Continue Miralax  daily. - Monitor response to constipation  medication and adjust as needed. - Follow up with gastroenterologist next month to assess treatment efficacy and discuss further management.  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      Nancyann Perry, MD  [facility] [phone] (phone) [phone] (fax)  [facility]    [1]  Outpatient Medications Prior to Visit  Medication Sig   apixaban  (ELIQUIS ) 2.5 MG TABS tablet Take 1 tablet (2.5 mg total) by mouth 2 (two) times daily.   calcium -vitamin D  (OSCAL) 250-125 MG-UNIT per tablet Take 1 tablet by mouth 2 (two) times daily.    cetirizine (ZYRTEC) 10 MG tablet Take 10 mg by mouth daily as needed for allergies.   cyanocobalamin  1000 MCG tablet Take 1 tablet by mouth daily.   diltiazem  (CARDIZEM  CD) 120 MG 24 hr capsule Take 120 mg by mouth daily.   Ferrous Sulfate  134 MG TABS Take 1 tablet by mouth daily.   flecainide  (TAMBOCOR ) 50 MG tablet Take 1 tablet (50 mg total) by mouth 2 (two) times daily.   gabapentin  (NEURONTIN ) 100 MG capsule Take one in the morning, one in the afternoon, and three at night   GEMTESA  75 MG TABS Take 1 tablet (75 mg total) by mouth daily.   Leuprolide  Acetate, 6 Month, (LUPRON ) 45 MG injection Inject 45 mg into the muscle every 6 (six) months.   losartan  (COZAAR ) 25 MG tablet Take 1 tablet by mouth 2 (two) times daily.   meclizine  (ANTIVERT ) 25 MG tablet Take 1 tablet by mouth daily.   omeprazole  (PRILOSEC) 40 MG capsule Take 40 mg by mouth daily.   polyvinyl alcohol  (LIQUIFILM TEARS) 1.4 % ophthalmic solution 1 drop as needed.   propranolol  (INDERAL ) 10 MG tablet Take 10 mg by mouth 2 (two) times daily.   zolpidem  (AMBIEN ) 10 MG tablet TAKE ONE-HALF TO ONE TABLET AT BEDTIME.   No facility-administered medications prior to visit.   "

## 2024-11-07 ENCOUNTER — Other Ambulatory Visit (HOSPITAL_COMMUNITY): Payer: Self-pay

## 2024-11-07 ENCOUNTER — Other Ambulatory Visit: Payer: Self-pay

## 2024-11-17 ENCOUNTER — Telehealth: Payer: Self-pay

## 2024-11-17 NOTE — Telephone Encounter (Signed)
 Copied from CRM #[id number]. Topic: Clinical - Medical Advice >> Nov 15, 2024  3:40 PM Amy B wrote: Reason for CRM: Rogue with [facility] states the patient had a fall Monday night at 11 pm.  He states he dropped something and lost his balance.  Bruised right elbow with small skin tear.  No other injuries and did want to call EMS.  States he is doing well otherwise.

## 2024-11-22 ENCOUNTER — Other Ambulatory Visit (HOSPITAL_COMMUNITY): Payer: Self-pay

## 2024-11-22 ENCOUNTER — Telehealth: Payer: Self-pay | Admitting: Internal Medicine

## 2024-11-22 NOTE — Telephone Encounter (Signed)
 Pt called to get Hem consult r/s - spoke w/Jenn about how to r/s visit - since pt is est pt of Dr B, we were okay to get him r/s w/Lauren Dasie - pt confirmed date/time of appt - [facility]

## 2024-11-27 ENCOUNTER — Inpatient Hospital Stay

## 2024-11-27 ENCOUNTER — Inpatient Hospital Stay: Admitting: Nurse Practitioner

## 2024-11-27 ENCOUNTER — Telehealth: Payer: Self-pay | Admitting: Nurse Practitioner

## 2024-11-27 NOTE — Telephone Encounter (Signed)
 Pt no showed appt today, I called and spoke with pt, he thought it was tomorrow. I have r/s the appts to be tomorrow and confirmed with pt.

## 2024-11-28 ENCOUNTER — Telehealth: Payer: Self-pay

## 2024-11-28 ENCOUNTER — Inpatient Hospital Stay: Attending: Nurse Practitioner | Admitting: Nurse Practitioner

## 2024-11-28 ENCOUNTER — Encounter: Payer: Self-pay | Admitting: Nurse Practitioner

## 2024-11-28 ENCOUNTER — Inpatient Hospital Stay

## 2024-11-28 VITALS — BP 136/60 | HR 63 | Temp 97.0°F | Resp 16 | Wt 147.0 lb

## 2024-11-28 DIAGNOSIS — D61818 Other pancytopenia: Secondary | ICD-10-CM

## 2024-11-28 DIAGNOSIS — Z7901 Long term (current) use of anticoagulants: Secondary | ICD-10-CM

## 2024-11-28 DIAGNOSIS — R161 Splenomegaly, not elsewhere classified: Secondary | ICD-10-CM

## 2024-11-28 DIAGNOSIS — D696 Thrombocytopenia, unspecified: Secondary | ICD-10-CM

## 2024-11-28 DIAGNOSIS — I81 Portal vein thrombosis: Secondary | ICD-10-CM

## 2024-11-28 DIAGNOSIS — K7469 Other cirrhosis of liver: Secondary | ICD-10-CM

## 2024-11-28 LAB — CBC WITH DIFFERENTIAL (CANCER CENTER ONLY)
Abs Immature Granulocytes: 0.01 K/uL (ref 0.00–0.07)
Basophils Absolute: 0 K/uL (ref 0.0–0.1)
Basophils Relative: 1 %
Eosinophils Absolute: 0.1 K/uL (ref 0.0–0.5)
Eosinophils Relative: 3 %
HCT: 35.7 % — ABNORMAL LOW (ref 39.0–52.0)
Hemoglobin: 11.2 g/dL — ABNORMAL LOW (ref 13.0–17.0)
Immature Granulocytes: 0 %
Lymphocytes Relative: 13 %
Lymphs Abs: 0.4 K/uL — ABNORMAL LOW (ref 0.7–4.0)
MCH: 25.6 pg — ABNORMAL LOW (ref 26.0–34.0)
MCHC: 31.4 g/dL (ref 30.0–36.0)
MCV: 81.7 fL (ref 80.0–100.0)
Monocytes Absolute: 0.2 K/uL (ref 0.1–1.0)
Monocytes Relative: 6 %
Neutro Abs: 2.5 K/uL (ref 1.7–7.7)
Neutrophils Relative %: 77 %
Platelet Count: 60 K/uL — ABNORMAL LOW (ref 150–400)
RBC: 4.37 MIL/uL (ref 4.22–5.81)
RDW: 14.5 % (ref 11.5–15.5)
WBC Count: 3.2 K/uL — ABNORMAL LOW (ref 4.0–10.5)
nRBC: 0 % (ref 0.0–0.2)

## 2024-11-28 LAB — IRON AND TIBC
Iron: 54 ug/dL (ref 45–182)
Saturation Ratios: 15 % — ABNORMAL LOW (ref 17.9–39.5)
TIBC: 353 ug/dL (ref 250–450)
UIBC: 299 ug/dL

## 2024-11-28 LAB — CMP (CANCER CENTER ONLY)
ALT: 14 U/L (ref 0–44)
AST: 30 U/L (ref 15–41)
Albumin: 4.3 g/dL (ref 3.5–5.0)
Alkaline Phosphatase: 102 U/L (ref 38–126)
Anion gap: 9 (ref 5–15)
BUN: 10 mg/dL (ref 8–23)
CO2: 29 mmol/L (ref 22–32)
Calcium: 9.7 mg/dL (ref 8.9–10.3)
Chloride: 100 mmol/L (ref 98–111)
Creatinine: 0.74 mg/dL (ref 0.61–1.24)
GFR, Estimated: >60 mL/min
Glucose, Bld: 92 mg/dL (ref 70–99)
Potassium: 4.7 mmol/L (ref 3.5–5.1)
Sodium: 138 mmol/L (ref 135–145)
Total Bilirubin: 0.7 mg/dL (ref 0.0–1.2)
Total Protein: 7.4 g/dL (ref 6.5–8.1)

## 2024-11-28 LAB — APTT: aPTT: 36 s (ref 24–36)

## 2024-11-28 LAB — PROTIME-INR
INR: 1.4 — ABNORMAL HIGH (ref 0.8–1.2)
Prothrombin Time: 17.9 s — ABNORMAL HIGH (ref 11.4–15.2)

## 2024-11-28 LAB — FERRITIN: Ferritin: 111 ng/mL (ref 24–336)

## 2024-11-28 NOTE — Telephone Encounter (Signed)
 That's fine

## 2024-11-28 NOTE — Telephone Encounter (Unsigned)
 Copied from CRM [id number]. Topic: Clinical - Home Health Verbal Orders >> Nov 28, 2024  1:03 PM Antwanette L wrote: Caller/Agency: [facility] Callback Number: [phone] Service Requested: Physical Therapy Frequency: 2w3 and 1w6 Any new concerns about the patient? No

## 2024-11-28 NOTE — Progress Notes (Signed)
 [facility] CONSULT NOTE  Patient Care Team: [name], MD as PCP - General (Family Medicine) Helon Clotilda DELENA DEVONNA as Physician Assistant (Urology) Ammon Blunt, MD as Consulting Physician (Cardiology) Rennie Cindy SAUNDERS, MD as Consulting Physician (Hematology and Oncology) Maurine Lukes, PA-C as Physician Assistant (Urology) Jaye Fallow, MD as Referring Physician (Ophthalmology) Dermatology, [place]  CHIEF COMPLAINTS/PURPOSE OF CONSULTATION: Thrombocytopenia & Splenic Vein Thrombosis  HEMATOLOGY HISTORY Oncology History Overview Note  # PANCYTOPENIA [platelets-50s ]; ANC- 1.2; Hb-11 -likely secondary cirrhosis/splenomegaly; November 2021 bone marrow biopsy-no evidence of malignancy.-   # Prostate cancer [s/p Prostatectomy 1995]; elevation PSA-Lupron  [Sharon McGovern]  # Cirrhosis [2018; MRI; urology]; A.fib on asprin ;[discontinued Xarelto - sec SDH [Sep 2021; s/p fall]; Dr. Vito; colonoscopy 2019; EGD- SEP 2020-portal gastropathy- Dr. [name]   Prostate cancer [facility]) (Resolved)  06/20/2015 Initial Diagnosis   Prostate cancer (HCC)   Prostate cancer (HCC)  09/12/2020 Initial Diagnosis   Prostate cancer (HCC)    HISTORY OF PRESENTING ILLNESS: Alone.  Walks with a walker.   Ian Sanders 84 y.o. male pleasant patient with history of alcohol  use /cirrhosis /A. Fib-not on anticoagulation he is here for follow-up. He was last seen by Dr Rennie while hospitalized in NOvember 2025. 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 He was treated with antibiotics and eliquis  2.5 mg twice daily was recommended. He represented to ER on 10/17/24 with BRB from rectum. Colonoscopy showed diffuse moderate inflammation of rectum, sigmoid colon, and descending colon. Nonbleeding internal hemorrhoids were found. Biopsies were negative. It was recommended  that he continue eliquis .   Today, he reports feeling at baseline. Denies black or bloody stools. No blood in the urine. Denies nausea or vomiting. Denies leg swelling. He had a fall 2 weeks ago and scraped his arm. Became off balance while getting laundry out of the dryer. He required assistance to stand. He denies other falls in the past year. Uses a walker for ambulation.    Review of Systems  Constitutional:  Positive for malaise/fatigue. Negative for chills, diaphoresis, fever and weight loss.  HENT:  Negative for nosebleeds and sore throat.   Eyes:  Negative for double vision.  Respiratory:  Negative for cough, hemoptysis, sputum production, shortness of breath and wheezing.   Cardiovascular:  Negative for chest pain, palpitations, orthopnea and leg swelling.  Gastrointestinal:  Negative for abdominal pain, blood in stool, constipation, diarrhea, heartburn, melena, nausea and vomiting.  Genitourinary:  Negative for dysuria, frequency and urgency.  Musculoskeletal:  Positive for back pain and joint pain.  Skin: Negative.  Negative for itching and rash.  Neurological:  Negative for dizziness, tingling, focal weakness, weakness and headaches.  Endo/Heme/Allergies:  Does not bruise/bleed easily.  Psychiatric/Behavioral:  Negative for depression. The patient is not nervous/anxious and does not have insomnia.    MEDICAL HISTORY:  Past Medical History:  Diagnosis Date   Atrial fibrillation (HCC)    Basal cell carcinoma    Benign paroxysmal positional vertigo    Cancer (HCC)    Cellulitis of left foot 09/18/2023   Closed fracture of distal end of left fibula with routine healing 09/15/2023   Heart palpitations 02/13/2020   History of chicken pox    History of measles    History of mumps    Hypertension    Mononeuritis of unspecified site    Other seborrheic keratosis    Stroke (HCC) 06/2020  Transient ischemic attack (TIA), and cerebral infarction without residual deficits(V12.54)     Traumatic subdural hematoma without loss of consciousness (HCC) 06/25/2020   Unspecified hypothyroidism     SURGICAL HISTORY: Past Surgical History:  Procedure Laterality Date   Carotid Doppler Ultrasound  04/02/2010   Small amount Calcified plaque bilaterally, no significant stenosis.   CATARACT EXTRACTION W/PHACO Right 04/15/2021   Procedure: CATARACT EXTRACTION PHACO AND INTRAOCULAR LENS PLACEMENT (IOC) RIGHT;  Surgeon: Jaye Fallow, MD;  Location: [facility];  Service: Ophthalmology;  Laterality: Right;  7.98 0:47.2   CATARACT EXTRACTION W/PHACO Left 05/20/2021   Procedure: CATARACT EXTRACTION PHACO AND INTRAOCULAR LENS PLACEMENT (IOC) LEFT 3.37 00:32.0;  Surgeon: Jaye Fallow, MD;  Location: [facility];  Service: Ophthalmology;  Laterality: Left;  Requests arrival 10A or after   COLONOSCOPY  05/11/2014   Tubular Adenoma. Dr. Jinny   COLONOSCOPY N/A 10/19/2024   Procedure: COLONOSCOPY;  Surgeon: Jinny Carmine, MD;  Location: [facility] END[facility]COPY;  Service: Endoscopy;  Laterality: N/A;   COLONOSCOPY WITH PROPOFOL  N/A 05/23/2018   Procedure: COLONOSCOPY WITH PROPOFOL ;  Surgeon: [name] Bi, MD;  Location: [facility] ENDOSCOPY;  Service: Gastroenterology;  Laterality: N/A;   DOPPLER ECHOCARDIOGRAPHY  04/02/2010   Mild left atrial dilation. Normal right atrium. No valvular disease. No thrombus. Normal LV function. EF>55%   ESOPHAGOGASTRODUODENOSCOPY (EGD) WITH PROPOFOL  N/A 08/16/2019   Procedure: ESOPHAGOGASTRODUODENOSCOPY (EGD) WITH PROPOFOL ;  Surgeon: [name] Bi, MD;  Location: [facility] ENDOSCOPY;  Service: Gastroenterology;  Laterality: N/A;   FINGER AMPUTATION     HERNIA REPAIR Right    MRI Brain with and without contrast  03/20/2010   Suggestive of basal ganglia lacunar infarct   PROSTATECTOMY  11/16/1993   Abdominal   SKIN SURGERY     multiple   Thumb surgery      SOCIAL HISTORY: Social History   Socioeconomic History   Marital status: Married    Spouse  name: Not on file   Number of children: 2   Years of education: Not on file   Highest education level: Associate degree: occupational, scientist, product/process development, or vocational program  Occupational History   Occupation: Retired  Tobacco Use   Smoking status: Former    Current packs/day: 0.00    Types: Cigarettes   Smokeless tobacco: Current    Types: Chew   Tobacco comments:    quit over 45 years ago  Vaping Use   Vaping status: Never Used  Substance and Sexual Activity   Alcohol  use: Not Currently    Alcohol /week: 5.0 standard drinks of alcohol     Types: 5 Cans of beer per week    Comment: quit drinking summer 2021   Drug use: No   Sexual activity: Not on file  Other Topics Concern   Not on file  Social History Narrative   Lives with wife at home ; alcohol  abuse; quit smoking 2016; 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   Social Drivers of Health   Tobacco Use: High Risk (11/28/2024)   Patient History    Smoking Tobacco Use: Former    Smokeless Tobacco Use: Current    Passive Exposure: Not on file  Financial Resource Strain: Low Risk  (10/16/2024)   Received from [facility]   Overall Financial Resource Strain (CARDIA)    Difficulty of Paying Living Expenses: Not very hard  Food Insecurity: No Food Insecurity (10/17/2024)   Epic    Worried About Running Out of Food in the Last Year: Never true    Ran  Out of Food in the Last Year: Never true  Recent Concern: Food Insecurity - Food Insecurity Present (09/22/2024)   Epic    Worried About Programme Researcher, Broadcasting/film/video in the Last Year: Sometimes true    Ran Out of Food in the Last Year: Sometimes true  Transportation Needs: No Transportation Needs (10/17/2024)   Epic    Lack of Transportation (Medical): No    Lack of Transportation (Non-Medical): No  Physical Activity: Insufficiently Active (01/18/2024)   Exercise Vital Sign    Days of Exercise per Week: 7 days    Minutes of Exercise per Session: 20 min  Stress: No  Stress Concern Present (01/18/2024)   [employer] of Occupational Health - Occupational Stress Questionnaire    Feeling of Stress : Not at all  Social Connections: Moderately Isolated (10/17/2024)   Social Connection and Isolation Panel    Frequency of Communication with Friends and Family: More than three times a week    Frequency of Social Gatherings with Friends and Family: Never    Attends Religious Services: Never    Database Administrator or Organizations: No    Attends Banker Meetings: Never    Marital Status: Married  Catering Manager Violence: Not At Risk (10/17/2024)   Epic    Fear of Current or Ex-Partner: No    Emotionally Abused: No    Physically Abused: No    Sexually Abused: No  Depression (PHQ2-9): Low Risk (11/28/2024)   Depression (PHQ2-9)    PHQ-2 Score: 0  Recent Concern: Depression (PHQ2-9) - Medium Risk (10/06/2024)   Depression (PHQ2-9)    PHQ-2 Score: 5  Alcohol  Screen: Low Risk (01/18/2024)   Alcohol  Screen    Last Alcohol  Screening Score (AUDIT): 0  Housing: Low Risk (10/17/2024)   Epic    Unable to Pay for Housing in the Last Year: No    Number of Times Moved in the Last Year: 0    Homeless in the Last Year: No  Utilities: Not At Risk (10/17/2024)   Epic    Threatened with loss of utilities: No  Health Literacy: Adequate Health Literacy (01/18/2024)   B1300 Health Literacy    Frequency of need for help with medical instructions: Never    FAMILY HISTORY: Family History  Problem Relation Age of Onset   Heart disease Mother 37       CABG   Hypertension Mother    Heart attack Mother    Hypertension Brother    Heart disease Sister        stents   Prostate cancer Brother    Kidney cancer Brother    Skin cancer Brother    Hypertension Brother    Bladder Cancer Sister    Leukemia Father    Stroke Father        multiple   Cerebral aneurysm Sister    Kidney cancer Brother     ALLERGIES:  is allergic to lisinopril, myrbetriq   [mirabegron ], beta adrenergic blockers, other, triamterene, amlodipine, atorvastatin, dabigatran etexilate mesylate, dabigatran etexilate mesylate, and latex.  MEDICATIONS:  Current Outpatient Medications  Medication Sig Dispense Refill   apixaban  (ELIQUIS ) 2.5 MG TABS tablet Take 1 tablet (2.5 mg total) by mouth 2 (two) times daily. 60 tablet 5   calcium -vitamin D  (OSCAL) 250-125 MG-UNIT per tablet Take 1 tablet by mouth 2 (two) times daily.      cetirizine (ZYRTEC) 10 MG tablet Take 10 mg by mouth daily as needed for allergies.  cyanocobalamin  1000 MCG tablet Take 1 tablet by mouth daily.     diltiazem  (CARDIZEM  CD) 120 MG 24 hr capsule Take 120 mg by mouth daily.     Ferrous Sulfate  134 MG TABS Take 1 tablet by mouth daily.     flecainide  (TAMBOCOR ) 50 MG tablet Take 1 tablet (50 mg total) by mouth 2 (two) times daily. 180 tablet 3   gabapentin  (NEURONTIN ) 100 MG capsule Take one in the morning, one in the afternoon, and three at night 450 capsule 3   GEMTESA  75 MG TABS Take 1 tablet (75 mg total) by mouth daily. 30 tablet 3   Leuprolide  Acetate, 6 Month, (LUPRON ) 45 MG injection Inject 45 mg into the muscle every 6 (six) months.     losartan  (COZAAR ) 25 MG tablet Take 1 tablet by mouth 2 (two) times daily.     lubiprostone  (AMITIZA ) 24 MCG capsule Take 1 capsule (24 mcg total) by mouth 2 (two) times daily with a meal. 90 capsule 1   meclizine  (ANTIVERT ) 25 MG tablet Take 1 tablet by mouth daily.     omeprazole  (PRILOSEC) 40 MG capsule Take 40 mg by mouth daily.     polyvinyl alcohol  (LIQUIFILM TEARS) 1.4 % ophthalmic solution 1 drop as needed.     propranolol  (INDERAL ) 10 MG tablet Take 10 mg by mouth 2 (two) times daily.     zolpidem  (AMBIEN ) 10 MG tablet TAKE ONE-HALF TO ONE TABLET AT BEDTIME. 30 tablet 3   No current facility-administered medications for this visit.     PHYSICAL EXAMINATION: Vitals:   11/28/24 1423 11/28/24 1427  BP: (!) 144/59 136/60  Pulse: 63   Resp: 16    Temp: (!) 97 F (36.1 C)   SpO2: 100%    Filed Weights   11/28/24 1423  Weight: 147 lb (66.7 kg)   Physical Exam Vitals reviewed.  Constitutional:      Comments: Frail appearing. Unaccompanied. Walker.   HENT:     Head: Normocephalic and atraumatic.  Eyes:     Pupils: Pupils are equal, round, and reactive to light.  Cardiovascular:     Rate and Rhythm: Normal rate and regular rhythm.  Pulmonary:     Effort: No respiratory distress.     Breath sounds: No wheezing.  Abdominal:     General: There is no distension.     Palpations: Abdomen is soft.     Tenderness: There is no abdominal tenderness. There is no guarding.     Comments: Positive for splenomegaly.  Musculoskeletal:        General: No tenderness.  Skin:    General: Skin is warm.     Coloration: Skin is not pale.  Neurological:     Mental Status: He is alert and oriented to person, place, and time.  Psychiatric:        Mood and Affect: Mood and affect normal.        Behavior: Behavior normal.     LABORATORY DATA:  I have reviewed the data as listed Lab Results  Component Value Date   WBC 3.2 (L) 11/28/2024   HGB 11.2 (L) 11/28/2024   HCT 35.7 (L) 11/28/2024   MCV 81.7 11/28/2024   PLT 60 (L) 11/28/2024   Recent Labs    09/23/24 0341 09/24/24 0554 09/25/24 0520 09/26/24 0331 10/06/24 1443 10/17/24 1155 10/19/24 0514 10/20/24 0557 11/28/24 1503  NA 133* 135 135   < > 137 134* 135 135 138  K 3.2* 4.1 3.9   < >  4.7 4.2 3.6 3.6 4.7  CL 98 104 99   < > 99 98 101 102 100  CO2 24 25 24    < > 28 24 25 25 29   GLUCOSE 98 99 101*   < > 82 100* 93 93 92  BUN 20 13 10    < > 12 14 11 8 10   CREATININE 0.60* 0.56* 0.70   < > 0.78 0.66 0.57* 0.53* 0.74  CALCIUM  8.0* 8.1* 8.2*   < > 9.3 9.1 8.3* 8.4* 9.7  GFRNONAA >60 >60 >60   < >  --  >60 >60 >60 >60  PROT 6.1* 6.2* 6.5  --  7.1 7.0  --   --  7.4  ALBUMIN 3.0* 2.9* 2.9*  --  4.2 3.9  --   --  4.3  AST 17 17 18   --  27 19  --   --  30  ALT 9 9 9   --  11 6   --   --  14  ALKPHOS 63 64 68  --  106 89  --   --  102  BILITOT 1.3* 1.2 1.0  --  0.7 1.1  --   --  0.7  BILIDIR 0.4* 0.4* 0.3*  --  0.32  --   --   --   --   IBILI 0.9 0.8 0.7  --   --   --   --   --   --    < > = values in this interval not displayed.   No results found.  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 November 2021- bone marrow- NEG/reactive.  Continue surveillance.  Overall stable.   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 On oral iron every other day. Ferritin and iron studies pending. Hold off on iron infusions for now.    #A. Fib- on aspirin ; Xarelto  discontinued [hx of fall intracranial bleed]-  stable.    # Cirrhosis- child Pugh a [CT scan April 2022-NEG- no evidence of any decompensation; discussed re: HCC screening-  NOV 2023-liver morphology suggestive of cirrhosis.  No focal lesions noted.  AFP pending today. Declines 6 months ultrasound [fall risk]. He has been receiving ultrasounds yearly - plan to discuss at next visit.    # Prostate cancer/biochemical recurrence- PSA [Proastatectomy 1995;PSA- 9 started on Lupron ] ; Clotilda McGowan].  2021 PSA less than 0.1. stable.  # Portal venous thrombosis- splenic vein thrombosis. Previously discussed with Dr Maree, unfortunately no noninvasive options available. In the context of cirrhosis splenomegaly/and recent colitis. Again discussed the patient-the pros and cons of anticoagulation-including risk of bleeding given her history of subdural hematoma-traumatic [2021]; and ongoing thrombocytopenia/history of falls.  However given the symptomatic/bulky portal venous-splenic vein thrombosis-recommend proceeding with anticoagulation. On eliquis  low dose 2.5 mg twice daily. Tolerating well. Patient's preference is to continue eliquis  and he is aware of risks of bleeding. I recommended a medical alert bracelet.     # Colitis-right sided. Now resolved.     DISPOSITION: Labs today (cbc, cmp, ferritin, iron studies, pt, ptt) 3 mo- labs, Dr Rennie- la  No problem-specific Assessment & Plan notes found for this encounter.  Tinnie KANDICE Dawn, NP 11/28/2024   CC: Dr Rennie

## 2024-11-29 LAB — AFP TUMOR MARKER: AFP, Serum, Tumor Marker: <1.8 ng/mL (ref 0.0–6.4)

## 2024-11-29 NOTE — Telephone Encounter (Signed)
 Verbal okay per dr.Fisher given to [facility] with [facility].

## 2024-11-30 ENCOUNTER — Ambulatory Visit: Admitting: Gastroenterology

## 2024-11-30 ENCOUNTER — Encounter: Payer: Self-pay | Admitting: Gastroenterology

## 2024-11-30 VITALS — BP 154/72 | HR 62 | Temp 97.7°F | Wt 146.0 lb

## 2024-11-30 DIAGNOSIS — K7030 Alcoholic cirrhosis of liver without ascites: Secondary | ICD-10-CM | POA: Diagnosis not present

## 2024-11-30 DIAGNOSIS — F1091 Alcohol use, unspecified, in remission: Secondary | ICD-10-CM | POA: Diagnosis not present

## 2024-11-30 NOTE — Patient Instructions (Addendum)
 Your ultrasound has been scheduled for January 20th arrive at 10:30am at [facility], [address] [place], [place], [place] [address]  You can not have anything to eat or drink after 12 midnight the day before   If you need to cancel or reschedule ultrasound, please call [phone]

## 2024-11-30 NOTE — Progress Notes (Signed)
 "   Primary Care Physician: [name], MD  Primary Gastroenterologist:  Dr. Clotilda Schaffer  Chief Complaint  Patient presents with   New Patient (Initial Visit)   Hospitalization Follow-up    HPI: Ian Sanders is a 84 y.o. male here in follow-up of a recent hospital admission for low GI bleed with hemoglobin 11.6 going to 8.8.  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  He developed blood in his underwear and presented to the ER.  He is noted to have had a recent E. coli colitis, treated with Cipro , and November 2025.  During his admission he had colonoscopy on 12//25 that showed left-sided colitis grossly, although biopsies were normal on pathology.  He has known alcoholic cirrhosis, has been sober for many many years.  An ultrasound done during his admission was focused on fluid analysis, and showed only trace ascites. 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  He had LFTs performed 2 days ago showing AST 30, ALT 14, T. bili 0.7, normal albumin at 4.3, INR 1.4 and platelets 60.  Hemoglobin was 11.2 and iron studies were normal.  Past Medical History:  Diagnosis Date   Atrial fibrillation (HCC)    Basal cell carcinoma    Benign paroxysmal positional vertigo    Cancer (HCC)    Cellulitis of left foot 09/18/2023   Closed fracture of distal end of left fibula with routine healing 09/15/2023   Heart palpitations 02/13/2020   History of chicken pox    History of measles    History of mumps    Hypertension    Mononeuritis of unspecified site    Other seborrheic keratosis    Stroke (HCC) 06/2020   Transient ischemic attack (TIA), and cerebral infarction without residual deficits(V12.54)    Traumatic subdural hematoma without loss of consciousness (HCC) 06/25/2020   Unspecified hypothyroidism     Current Outpatient  Medications  Medication Sig Dispense Refill   apixaban  (ELIQUIS ) 2.5 MG TABS tablet Take 1 tablet (2.5 mg total) by mouth 2 (two) times daily. 60 tablet 5   calcium -vitamin D  (OSCAL) 250-125 MG-UNIT per tablet Take 1 tablet by mouth 2 (two) times daily.      cetirizine (ZYRTEC) 10 MG tablet Take 10 mg by mouth daily as needed for allergies.     cyanocobalamin  1000 MCG tablet Take 1 tablet by mouth daily.     diltiazem  (CARDIZEM  CD) 120 MG 24 hr capsule Take 120 mg by mouth daily.     Ferrous Sulfate  134 MG TABS Take 1 tablet by mouth daily.     flecainide  (TAMBOCOR ) 50 MG tablet Take 1 tablet (50 mg total) by mouth 2 (two) times daily. 180 tablet 3   gabapentin  (NEURONTIN ) 100 MG capsule Take one in the morning, one in the afternoon, and three at night 450 capsule 3   GEMTESA  75 MG TABS Take 1 tablet (75 mg total) by mouth daily. 30 tablet 3   Leuprolide  Acetate, 6 Month, (LUPRON ) 45 MG injection Inject 45 mg into the muscle every 6 (six) months.     losartan  (COZAAR ) 25 MG tablet Take 1 tablet by mouth 2 (two) times daily.     lubiprostone  (AMITIZA ) 24 MCG capsule Take 1 capsule (24 mcg total) by mouth 2 (two) times daily with a meal. 90 capsule 1   meclizine  (ANTIVERT )  25 MG tablet Take 1 tablet by mouth daily.     omeprazole  (PRILOSEC) 40 MG capsule Take 40 mg by mouth daily.     polyvinyl alcohol  (LIQUIFILM TEARS) 1.4 % ophthalmic solution 1 drop as needed.     propranolol  (INDERAL ) 10 MG tablet Take 10 mg by mouth 2 (two) times daily.     zolpidem  (AMBIEN ) 10 MG tablet TAKE ONE-HALF TO ONE TABLET AT BEDTIME. 30 tablet 3   No current facility-administered medications for this visit.    Allergies as of 11/30/2024 - Review Complete 11/30/2024  Allergen Reaction Noted   Lisinopril Swelling 02/12/2015   Myrbetriq  [mirabegron ] Swelling 12/26/2015   Beta adrenergic blockers  08/20/2020   Other Other (See Comments) 08/20/2020   Triamterene  08/27/2004   Amlodipine Other (See Comments)  06/11/2020   Atorvastatin Other (See Comments) 06/19/2015   Dabigatran etexilate mesylate Other (See Comments) 12/14/2011   Dabigatran etexilate mesylate Other (See Comments) 12/14/2011   Latex Rash 09/11/2023    ROS:  General: Negative for anorexia, weight loss, fever, chills, fatigue, weakness. ENT: Negative for hoarseness, difficulty swallowing , nasal congestion. CV: Negative for chest pain, angina, palpitations, dyspnea on exertion, peripheral edema.  Respiratory: Negative for dyspnea at rest, dyspnea on exertion, cough, sputum, wheezing.  GI: See history of present illness. GU:  Negative for dysuria, hematuria, urinary incontinence, urinary frequency, nocturnal urination.  Endo: Negative for unusual weight change.    Physical Examination:   BP (!) 154/72 (BP Location: Right Arm, Patient Position: Sitting, Cuff Size: Normal)   Pulse 62   Temp 97.7 F (36.5 C) (Oral)   Wt 146 lb (66.2 kg)   BMI 21.56 kg/m   General: Well-nourished, well-developed in no acute distress.  Eyes: No icterus. Conjunctivae pink. Lungs: Clear to auscultation bilaterally. Non-labored. Heart: Regular rate and rhythm, no murmurs rubs or gallops.  Abdomen: Bowel sounds are normal, nontender, nondistended, no hepatosplenomegaly or masses, no abdominal bruits or hernia , no rebound or guarding.   Extremities: No lower extremity edema. No clubbing or deformities. Neuro: Alert and oriented x 3.  Grossly intact. Skin: Warm and dry, no jaundice.   Psych: Alert and cooperative, normal mood and affect.  Labs:  As above. Imaging Studies: No results found.  Assessment and Plan:   Ian Sanders is a 84 y.o. y/o male with alcoholic cirrhosis, sober for many years, with PV and splenic vein clots, on Eliquis , following up from recent admission for lower GI bleed.  He has not seen no recurrent bleeding and hemoglobin is at baseline of 11.2.  His cirrhosis is well compensated. Plan updated EGD to evaluate  for varices, and ultrasound for [facility] surveillance. Follow-up 6 months.     Clotilda Schaffer, MD    Note: This dictation was prepared with Dragon dictation along with smaller phrase technology. Any transcriptional errors that result from this process are unintentional.   "

## 2024-12-01 ENCOUNTER — Other Ambulatory Visit (HOSPITAL_COMMUNITY): Payer: Self-pay

## 2024-12-01 ENCOUNTER — Other Ambulatory Visit: Payer: Self-pay

## 2024-12-01 DIAGNOSIS — I10 Essential (primary) hypertension: Secondary | ICD-10-CM | POA: Diagnosis not present

## 2024-12-01 DIAGNOSIS — G629 Polyneuropathy, unspecified: Secondary | ICD-10-CM | POA: Diagnosis not present

## 2024-12-01 DIAGNOSIS — I480 Paroxysmal atrial fibrillation: Secondary | ICD-10-CM | POA: Diagnosis not present

## 2024-12-01 DIAGNOSIS — E039 Hypothyroidism, unspecified: Secondary | ICD-10-CM | POA: Diagnosis not present

## 2024-12-01 DIAGNOSIS — D696 Thrombocytopenia, unspecified: Secondary | ICD-10-CM | POA: Diagnosis not present

## 2024-12-01 DIAGNOSIS — Z8673 Personal history of transient ischemic attack (TIA), and cerebral infarction without residual deficits: Secondary | ICD-10-CM | POA: Diagnosis not present

## 2024-12-01 DIAGNOSIS — A044 Other intestinal Escherichia coli infections: Secondary | ICD-10-CM | POA: Diagnosis not present

## 2024-12-01 DIAGNOSIS — K219 Gastro-esophageal reflux disease without esophagitis: Secondary | ICD-10-CM | POA: Diagnosis not present

## 2024-12-01 DIAGNOSIS — K7030 Alcoholic cirrhosis of liver without ascites: Secondary | ICD-10-CM | POA: Diagnosis not present

## 2024-12-01 DIAGNOSIS — E559 Vitamin D deficiency, unspecified: Secondary | ICD-10-CM | POA: Diagnosis not present

## 2024-12-01 DIAGNOSIS — I81 Portal vein thrombosis: Secondary | ICD-10-CM | POA: Diagnosis not present

## 2024-12-01 DIAGNOSIS — D735 Infarction of spleen: Secondary | ICD-10-CM | POA: Diagnosis not present

## 2024-12-05 ENCOUNTER — Ambulatory Visit
Admission: RE | Admit: 2024-12-05 | Discharge: 2024-12-05 | Disposition: A | Discharge status: Home / Self Care | Source: Ambulatory Visit | Attending: Gastroenterology | Admitting: Gastroenterology

## 2024-12-05 ENCOUNTER — Other Ambulatory Visit: Payer: Self-pay

## 2024-12-05 DIAGNOSIS — K7030 Alcoholic cirrhosis of liver without ascites: Secondary | ICD-10-CM | POA: Diagnosis present

## 2024-12-13 ENCOUNTER — Other Ambulatory Visit: Payer: Self-pay | Admitting: Physician Assistant

## 2024-12-13 DIAGNOSIS — N393 Stress incontinence (female) (male): Secondary | ICD-10-CM

## 2024-12-21 ENCOUNTER — Other Ambulatory Visit: Payer: Self-pay

## 2024-12-21 DIAGNOSIS — D696 Thrombocytopenia, unspecified: Secondary | ICD-10-CM

## 2024-12-25 ENCOUNTER — Ambulatory Visit: Admitting: Family Medicine

## 2024-12-25 DIAGNOSIS — Z720 Tobacco use: Secondary | ICD-10-CM

## 2024-12-25 DIAGNOSIS — E781 Pure hyperglyceridemia: Secondary | ICD-10-CM

## 2024-12-25 DIAGNOSIS — D61818 Other pancytopenia: Secondary | ICD-10-CM

## 2024-12-25 DIAGNOSIS — I480 Paroxysmal atrial fibrillation: Secondary | ICD-10-CM

## 2024-12-25 DIAGNOSIS — C61 Malignant neoplasm of prostate: Secondary | ICD-10-CM

## 2024-12-25 DIAGNOSIS — R161 Splenomegaly, not elsewhere classified: Secondary | ICD-10-CM

## 2024-12-25 DIAGNOSIS — R531 Weakness: Secondary | ICD-10-CM

## 2024-12-25 DIAGNOSIS — I10 Essential (primary) hypertension: Secondary | ICD-10-CM

## 2024-12-25 DIAGNOSIS — F5101 Primary insomnia: Secondary | ICD-10-CM

## 2024-12-25 DIAGNOSIS — G629 Polyneuropathy, unspecified: Secondary | ICD-10-CM

## 2024-12-25 DIAGNOSIS — K219 Gastro-esophageal reflux disease without esophagitis: Secondary | ICD-10-CM

## 2024-12-25 DIAGNOSIS — I700 Atherosclerosis of aorta: Secondary | ICD-10-CM

## 2024-12-25 DIAGNOSIS — R7303 Prediabetes: Secondary | ICD-10-CM

## 2024-12-25 DIAGNOSIS — D696 Thrombocytopenia, unspecified: Secondary | ICD-10-CM

## 2024-12-25 DIAGNOSIS — N393 Stress incontinence (female) (male): Secondary | ICD-10-CM

## 2024-12-25 DIAGNOSIS — K7030 Alcoholic cirrhosis of liver without ascites: Secondary | ICD-10-CM

## 2024-12-25 DIAGNOSIS — Z7901 Long term (current) use of anticoagulants: Secondary | ICD-10-CM

## 2024-12-25 DIAGNOSIS — Z8673 Personal history of transient ischemic attack (TIA), and cerebral infarction without residual deficits: Secondary | ICD-10-CM

## 2024-12-26 ENCOUNTER — Ambulatory Visit: Admitting: Physician Assistant

## 2024-12-28 ENCOUNTER — Other Ambulatory Visit

## 2025-01-11 ENCOUNTER — Other Ambulatory Visit

## 2025-01-16 ENCOUNTER — Ambulatory Visit: Admitting: Urology

## 2025-01-17 ENCOUNTER — Ambulatory Visit: Admitting: Urology

## 2025-01-23 ENCOUNTER — Ambulatory Visit

## 2025-02-26 ENCOUNTER — Inpatient Hospital Stay

## 2025-02-26 ENCOUNTER — Inpatient Hospital Stay: Admitting: Internal Medicine

## 2025-03-13 ENCOUNTER — Ambulatory Visit: Admit: 2025-03-13 | Admitting: Gastroenterology

## 2025-03-13 SURGERY — EGD (ESOPHAGOGASTRODUODENOSCOPY)
Anesthesia: General
